# Patient Record
Sex: Male | Born: 1960
Health system: Southern US, Community
[De-identification: ages and names within clinical notes are randomized; demographics above are authoritative.]

## PROBLEM LIST (undated history)

## (undated) DIAGNOSIS — T4145XA Adverse effect of unspecified anesthetic, initial encounter: Secondary | ICD-10-CM

## (undated) DIAGNOSIS — R0600 Dyspnea, unspecified: Secondary | ICD-10-CM

## (undated) DIAGNOSIS — K284 Chronic or unspecified gastrojejunal ulcer with hemorrhage: Secondary | ICD-10-CM

## (undated) DIAGNOSIS — I10 Essential (primary) hypertension: Secondary | ICD-10-CM

## (undated) DIAGNOSIS — D649 Anemia, unspecified: Secondary | ICD-10-CM

## (undated) DIAGNOSIS — N189 Chronic kidney disease, unspecified: Secondary | ICD-10-CM

## (undated) DIAGNOSIS — IMO0001 Reserved for inherently not codable concepts without codable children: Secondary | ICD-10-CM

## (undated) DIAGNOSIS — M1990 Unspecified osteoarthritis, unspecified site: Secondary | ICD-10-CM

## (undated) DIAGNOSIS — K274 Chronic or unspecified peptic ulcer, site unspecified, with hemorrhage: Secondary | ICD-10-CM

## (undated) DIAGNOSIS — J189 Pneumonia, unspecified organism: Secondary | ICD-10-CM

## (undated) DIAGNOSIS — T8859XA Other complications of anesthesia, initial encounter: Secondary | ICD-10-CM

## (undated) DIAGNOSIS — Z5189 Encounter for other specified aftercare: Secondary | ICD-10-CM

## (undated) HISTORY — PX: EXTERNAL EAR SURGERY: SHX627

## (undated) HISTORY — PX: COLONOSCOPY: SHX174

## (undated) HISTORY — DX: Chronic kidney disease, unspecified: N18.9

## (undated) HISTORY — PX: OTHER SURGICAL HISTORY: SHX169

## (undated) HISTORY — PX: JOINT REPLACEMENT: SHX530

---

## 2008-08-13 ENCOUNTER — Encounter: Admission: RE | Admit: 2008-08-13 | Discharge: 2008-08-13 | Payer: Self-pay | Admitting: Nephrology

## 2010-11-08 HISTORY — PX: HIP PINNING: SHX1757

## 2010-11-30 ENCOUNTER — Encounter (INDEPENDENT_AMBULATORY_CARE_PROVIDER_SITE_OTHER): Payer: Self-pay | Admitting: Internal Medicine

## 2010-11-30 ENCOUNTER — Inpatient Hospital Stay (HOSPITAL_COMMUNITY)
Admission: EM | Admit: 2010-11-30 | Discharge: 2010-12-03 | Payer: Self-pay | Source: Home / Self Care | Attending: Internal Medicine | Admitting: Internal Medicine

## 2010-12-01 LAB — BLOOD GAS, ARTERIAL
Bicarbonate: 20.2 mEq/L (ref 20.0–24.0)
Drawn by: 331001
FIO2: 1 %
MECHVT: 500 mL
PEEP: 5 cmH2O
RATE: 15 resp/min
pO2, Arterial: 188 mmHg — ABNORMAL HIGH (ref 80.0–100.0)

## 2010-12-01 LAB — DIFFERENTIAL
Basophils Absolute: 0 10*3/uL (ref 0.0–0.1)
Eosinophils Relative: 0 % (ref 0–5)

## 2010-12-01 LAB — PROTIME-INR
INR: 1.07 (ref 0.00–1.49)
Prothrombin Time: 14.1 seconds (ref 11.6–15.2)

## 2010-12-01 LAB — GLUCOSE, CAPILLARY: Glucose-Capillary: 118 mg/dL — ABNORMAL HIGH (ref 70–99)

## 2010-12-01 LAB — CBC
HCT: 35.3 % — ABNORMAL LOW (ref 39.0–52.0)
MCV: 94.9 fL (ref 78.0–100.0)
RBC: 3.72 MIL/uL — ABNORMAL LOW (ref 4.22–5.81)
RDW: 13.8 % (ref 11.5–15.5)

## 2010-12-01 LAB — BASIC METABOLIC PANEL
Chloride: 105 mEq/L (ref 96–112)
GFR calc non Af Amer: 31 mL/min — ABNORMAL LOW (ref >60)
Glucose, Bld: 119 mg/dL — ABNORMAL HIGH (ref 70–99)
Sodium: 137 mEq/L (ref 135–145)

## 2010-12-01 LAB — CROSSMATCH: Antibody Screen: NEGATIVE

## 2010-12-02 LAB — GLUCOSE, CAPILLARY
Glucose-Capillary: 93 mg/dL (ref 70–99)
Glucose-Capillary: 105 mg/dL — ABNORMAL HIGH (ref 70–99)
Glucose-Capillary: 115 mg/dL — ABNORMAL HIGH (ref 70–99)
Glucose-Capillary: 110 mg/dL — ABNORMAL HIGH (ref 70–99)
Glucose-Capillary: 104 mg/dL — ABNORMAL HIGH (ref 70–99)
Glucose-Capillary: 112 mg/dL — ABNORMAL HIGH (ref 70–99)
Glucose-Capillary: 107 mg/dL — ABNORMAL HIGH (ref 70–99)

## 2010-12-02 LAB — URINALYSIS, MICROSCOPIC ONLY
Bilirubin Urine: NEGATIVE
Ketones, ur: NEGATIVE mg/dL
Leukocytes, UA: NEGATIVE
Nitrite: NEGATIVE
Protein, ur: NEGATIVE mg/dL
Urobilinogen, UA: 0.2 mg/dL (ref 0.0–1.0)
pH: 5 (ref 5.0–8.0)

## 2010-12-02 LAB — DIFFERENTIAL
Basophils Relative: 0 % (ref 0–1)
Eosinophils Relative: 1 % (ref 0–5)
Lymphocytes Relative: 15 % (ref 12–46)
Monocytes Absolute: 1 10*3/uL (ref 0.1–1.0)
Monocytes Relative: 11 % (ref 3–12)
Neutro Abs: 6.9 10*3/uL (ref 1.7–7.7)
Neutrophils Relative %: 74 % (ref 43–77)

## 2010-12-02 LAB — COMPREHENSIVE METABOLIC PANEL
ALT: 15 U/L (ref 0–53)
AST: 21 U/L (ref 0–37)
Albumin: 3.2 g/dL — ABNORMAL LOW (ref 3.5–5.2)
Albumin: 3.1 g/dL — ABNORMAL LOW (ref 3.5–5.2)
Alkaline Phosphatase: 66 U/L (ref 39–117)
Calcium: 9.5 mg/dL (ref 8.4–10.5)
Chloride: 111 mEq/L (ref 96–112)
GFR calc Af Amer: 59 mL/min — ABNORMAL LOW (ref >60)
GFR calc Af Amer: >60 mL/min (ref >60)
Glucose, Bld: 93 mg/dL (ref 70–99)
Potassium: 4.5 mEq/L (ref 3.5–5.1)
Sodium: 143 mEq/L (ref 135–145)
Sodium: 136 mEq/L (ref 135–145)
Total Bilirubin: 0.7 mg/dL (ref 0.3–1.2)
Total Protein: 6.4 g/dL (ref 6.0–8.3)

## 2010-12-02 LAB — CBC
HCT: 30.2 % — ABNORMAL LOW (ref 39.0–52.0)
HCT: 30.7 % — ABNORMAL LOW (ref 39.0–52.0)
Hemoglobin: 10 g/dL — ABNORMAL LOW (ref 13.0–17.0)
MCHC: 32.6 g/dL (ref 30.0–36.0)
MCV: 95 fL (ref 78.0–100.0)
Platelets: 198 10*3/uL (ref 150–400)
RDW: 13.7 % (ref 11.5–15.5)
RDW: 13.6 % (ref 11.5–15.5)

## 2010-12-02 LAB — HEMOGLOBIN A1C: Mean Plasma Glucose: 128 mg/dL — ABNORMAL HIGH (ref <117)

## 2010-12-02 LAB — PROTIME-INR
INR: 1.44 (ref 0.00–1.49)
Prothrombin Time: 17.7 seconds — ABNORMAL HIGH (ref 11.6–15.2)

## 2010-12-02 LAB — BRAIN NATRIURETIC PEPTIDE: Pro B Natriuretic peptide (BNP): <30 pg/mL (ref 0.0–100.0)

## 2010-12-03 LAB — GLUCOSE, CAPILLARY
Glucose-Capillary: 92 mg/dL (ref 70–99)
Glucose-Capillary: 101 mg/dL — ABNORMAL HIGH (ref 70–99)

## 2010-12-03 LAB — PROTIME-INR: INR: 1.57 — ABNORMAL HIGH (ref 0.00–1.49)

## 2010-12-03 NOTE — Op Note (Addendum)
NAMEBIFF, CHARON              ACCOUNT NO.:  [id number]  MEDICAL RECORD NO.:  [id number]          PATIENT TYPE:  INP  LOCATION:  [id number]                         FACILITY:  [facility]  PHYSICIAN:  Johnny Bridge, MD    DATE OF BIRTH:  [birth year]  DATE OF PROCEDURE:  11/30/2010 DATE OF DISCHARGE:                              OPERATIVE REPORT   PREOPERATIVE DIAGNOSIS:  Left femoral neck fracture.  POSTOPERATIVE DIAGNOSIS:  Left femoral neck fracture.  OPERATIVE PROCEDURE:  Left hip percutaneous pinning.  ANESTHESIA:  General.  ESTIMATED BLOOD LOSS:  75 mL.  OPERATIVE IMPLANTS:  Synthes 7.3-mm cannulated screws x3.  These were stainless steel screws.  PREOPERATIVE INDICATIONS:  Mark [name] is a 50 year old morbidly obese gentleman who weighs approximately 350 pounds who had the somewhat insidious onset left hip pain without any preexisting trauma that he knows of.  This has been going on about a week.  He had previous [facility] done [facility] that diagnosed him with trochanteric bursitis.  He continued to have hip pain and presented to the emergency room today with worsening pain.  X-rays demonstrated evidence for a femoral neck fracture with mild displacement.  I discussed the risks, benefits and alternatives to percutaneous pinning versus total hip replacement versus hemiarthroplasty versus nonsurgical management with him, including the risks of infection, bleeding, nerve injury, blood clots, avascular necrosis, peri- prosthetic fracture, the need for revision surgery, the need for conversion hip arthroplasty, blood clots, cardiopulmonary complications, among others and he wished to proceed with percutaneous pinning.  OPERATIVE PROCEDURE:  The patient was brought to the operating room, placed in a supine position.  IV Ancef 2 g were given.  General anesthesia administered.  Left lower extremity was prepped and draped in the usual sterile fashion.  Incision was  made over the proximal aspect of the femur laterally.  A guidewire was introduced.  Localizing the three-dimensional location of his hip was extremely difficult given his size.  Nevertheless, I was able to ultimately find his proximal femur with a guidewire and then placed a single screw in the anterior inferior position.  I elected to place 2 inferior screws, one anteriorly and one posteriorly, given his size and the fact that I was trying to get the best purchase possible, which was in the inferior portion of the neck and head.  I placed a single central superior screw.  I tried to keep the screws as proximal as possible in order to minimize stress risers at the lesser trochanter, however, given his size and trajectory of the guide pins, this was somewhat challenging, but nevertheless, I had satisfactory position and I drilled and prepared the screws after measuring a length.  Getting a lateral view of the hip articulation itself was also extremely challenging given his size, however, we were able to get satisfactory views which demonstrated that the hardware was not penetrating the joint.  I then placed a total of 3 cannulated screws.  Excellent bite was achieved, especially on the distal most screws.  The wounds were irrigated copiously and then the subcutaneous tissue closed with 3-0 Vicryl followed by 4-0  Monocryl with Steri-Strips and sterile gauze.  He tolerated the procedure well.  There were no complications.  He will be touch-toe weightbearing for a period of 3 months.  He will be on blood thinners for a period of 2-month.     Johnny Bridge, MD     JPL/MEDQ  D:  11/30/2010  T:  12/01/2010  Job:  [id number]  Electronically Signed by Marchia Bond MD on 12/03/2010 05:31:24 PM
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
## 2010-12-11 NOTE — Discharge Summary (Signed)
  Mark [name]              ACCOUNT NO.:  [id number]  MEDICAL RECORD NO.:  [id number]          PATIENT TYPE:  INP  LOCATION:  5004                         FACILITY:  [facility]  PHYSICIAN:  Johnny Bridge, MD    DATE OF BIRTH:  1961/02/20  DATE OF ADMISSION:  11/30/2010 DATE OF DISCHARGE:  12/03/2010                              DISCHARGE SUMMARY   ADMISSION DIAGNOSES: 1. Left femoral neck fracture. 2. Acute renal failure.  DISCHARGE DIAGNOSIS:  Left femoral neck fracture.  ADDITIONAL DIAGNOSES: 1. Morbid obesity. 2. Diabetes. 3. Acute renal insufficiency.  Discharge medications will include Coumadin as well as Lovenox until his INR is therapeutic and Percocet, Robaxin, and additional medications per the medical reconciliation sheet.  DISCHARGE INSTRUCTIONS:  He should be touch toe weightbearing on left lower extremity for a duration of 3 months.  DISCHARGE FOLLOWUP:  He will follow up with me in 2 weeks.HOSPITAL COURSE:  [name] is a 50 year old gentleman who presented to the hospital with insidious onset left-sided hip pain. There was no trauma.  He was found to have a femoral neck fracture.  He went to surgery emergently for pinning.  He tolerated this well and postoperatively did not have any complications.  He was managed by the Medical Service as far as his medical issues were concerned.  From an orthopedic standpoint, he had his dressings changed on postoperative day 3, and his wounds were clean and dry.  Sensation was intact distally. His hemoglobin and hematocrit remained stable.  He was working with physical therapy and making reasonable progress.  His weightbearing status was touch toe weightbearing.  His renal function improved steadily throughout his hospitalization.  He is planning to be discharged home with follow up with me in approximately 2 weeks.  There are no complications.  He benefited maximally from this hospital stay. Additional  management regarding blood pressure medicines and diabetes medicines may be added per the [facility].  I will plan to see Mark [name] in 2 weeks.     Johnny Bridge, MD     JPL/MEDQ  D:  12/03/2010  T:  12/03/2010  Job:  [id number]  Electronically Signed by Marchia Bond MD on 12/11/2010 05:18:00 PM

## 2011-01-11 ENCOUNTER — Other Ambulatory Visit: Payer: Self-pay | Admitting: Orthopedic Surgery

## 2011-01-11 DIAGNOSIS — S72009A Fracture of unspecified part of neck of unspecified femur, initial encounter for closed fracture: Secondary | ICD-10-CM

## 2011-01-14 NOTE — Discharge Summary (Addendum)
NAMETAIGEN, GAMBOA              ACCOUNT NO.:  [id number]  MEDICAL RECORD NO.:  [id number]          PATIENT TYPE:  INP  LOCATION:  5004                         FACILITY:  [facility]  PHYSICIAN:  Sheila Oats, M.D.DATE OF BIRTH:  [birth year]  DATE OF ADMISSION:  11/30/2010 DATE OF DISCHARGE:  12/03/2010                        DISCHARGE SUMMARY - REFERRING   DISCHARGE DIAGNOSES: 1. Left femoral neck fracture - status post left hip percutaneous     pinning by Dr. Marchia Bond. 2. Acute-on-chronic renal insufficiency - resolved, creatinine prior     to discharge 1.21 with BUN of 24. 3. Hypertension. 4. Diabetes mellitus. 5. History of gout. 6. Obesity.  CONSULTATION:  Orthopedics - Dr. Johnny Bridge, MD.  PROCEDURES AND STUDIES: 1. Left femur x-ray on 11/30/2010 - nondisplaced fracture of the left     femoral neck. 2. Followup left hip x-ray - left femoral neck fracture. 3. Chest x-ray on 11/30/2010 - cardiomegaly, no active disease. 4. Left hip x-ray, status post surgery - three pins placed for     treatment of nondisplaced femoral neck fracture. 5. Followup chest x-ray on 11/30/2010 - bilateral perihilar air space     densities - aspiration pneumonitis cannot be excluded, although     perihilar atelectasis or bilateral pneumonia could have a similar     appearance.  Cardiomegaly.  Endotracheal tube tip 1.5 cm above the     carina. 6. Portable pelvic x-ray on 11/30/2010 - status post repair of left     hip fracture.  No acute findings. 7. Followup chest x-ray on 12/02/2010 - interval removal of     endotracheal tube.  Stable moderate enlargement of cardiac density.     Minimal right basilar atelectasis and infiltrative density in the     left base with improvement since the previous study. 8. A 2D echocardiogram done on 12/02/2010 - ejection fraction 65-70%,     no regional wall motion abnormality.  BRIEF HISTORY:  The patient is a 50 year old black male with the  above- listed medical problems who presented with complaints of left hip pain. He reported that he started having the pain about 14 days prior mostly with weightbearing.  He denied any recent fall or trauma.  He stated that he took ibuprofen with little relief.  About six days prior to this presentation, he went to the Urgent Care as the pain was worsening and was diagnosed with bursitis and treated with prednisone.  He took the prednisone as directed, but because his pain was worsening; he came to the ED.  He reported that it was affecting his ability to sleep or reposition himself.  In the ED, a chest x-ray was done, which was consistent with a left femoral neck fracture and workup revealed a creatinine of 2.25 and it was noted that the patient's baseline per Dr. Sanda Klein office was 1.3.  He was admitted for further evaluation and management.  HOSPITAL COURSE BY PROBLEM: 1. Left femoral neck fracture - upon admission, orthopedics was     consulted and Dr. Mardelle Matte saw the patient and he was taken to     surgery and percutaneous pinning  of that left femoral neck was     done.  PT/OT was consulted and they followed the patient and     recommended home health PT/OT at discharge.  Dr. Mardelle Matte followed up     with the patient; and from his standpoint, he is stable for     discharge at this time.  Dr. Mardelle Matte set the patient up for     outpatient Lovenox prophylactic dose at 40 mg daily along with     Coumadin for five days until the INR is therapeutic, and he is to     stay on the Coumadin for one month postop.  He is to follow up with     Dr. Mardelle Matte in two weeks.  The patient's INR today prior to     discharge is 1.57 and home health RN is to check his PT/INR. 2. Abnormal chest x-ray/atelectasis - the patient had an x-ray done     while on the ventilator on the day of his surgery and it revealed     bilateral perihilar airspace opacities and the differential     included aspiration  pneumonitis versus atelectasis versus bilateral     pneumonia.  The patient, however, remained asymptomatic without any     fevers, cough, or leukocytosis; so he was monitored after he was     extubated.  Repeat chest x-ray done on 12/02/2010 showed minimal     right basilar atelectasis and improvement of the atelectasis and     infiltrative density in the left base.  The patient has continued     to do well - afebrile, no leukocytosis, and no cough.  He will be     discharged at this time and he is to follow up outpatient.  The     patient had a brain natriuretic peptide done, which was less than     30 and a 2D echocardiogram was also done.  The results are as     stated above with an ejection fraction of 65-70% and normal wall     motion. 3. Diabetes mellitus - the patient's Accu-Cheks are monitored and he     was covered with sliding scale insulin during his hospital stay.     He is to continue his Actos and metformin upon discharge. 4. Hypertension - his ACE inhibitor was held during this hospital stay     because he presented with acute-on-chronic renal insufficiency and     his blood pressures were treated with beta blockers instead.  His     renal insufficiency has resolved at this time and he is to continue     his ACE inhibitor upon discharge. 5. Acute-on-chronic kidney disease - as discussed above, the patient's     creatinine on admission was 2.26 and his baseline per Dr. Sanda Klein     office is 1.3.  His ACE inhibitor was held.  During this hospital     stay, he was hydrated and with that his creatinine normalized -     creatinine of 1.21 with a BUN of 24.  He is to follow up with Dr.     Penelope Coop outpatient as scheduled.  DISCHARGE MEDICATIONS: 1. Calcium carbonate/vitamin D one to two tablets p.o. daily. 2. Lovenox 40 mg subcutaneously daily until the INR is therapeutic and     the Lovenox to be discontinued. 3. Methocarbamol 500 mg one p.o. every 6 hours p.r.n. 4. Percocet  10/325 mg one to two tablets every 6  hours p.r.n. 5. Coumadin 2.5 mg daily or as directed per MD and the patient is to     take for one month. 6. Actos 45 mg one p.o. daily. 7. Allopurinol 300 mg p.o. daily. 8. Lisinopril 10 mg p.o. daily. 9. Metformin 500 mg one p.o. b.i.d.  FOLLOWUP CARE: 1. Dr. Electa Sniff in one week. 2. Dr. Marchia Bond in two weeks.  DISCHARGE CONDITION:  Improved/stable.     Sheila Oats, M.D.     ACV/MEDQ  D:  12/03/2010  T:  12/03/2010  Job:  [id number]  cc:   Parke Poisson. Electa Sniff, M.D. Johnny Bridge, MD  Electronically Signed by Minette Headland M.D. on 01/12/2011 05:21:08 PM Electronically Signed by Minette Headland M.D. on 01/12/2011 07:32:29 PM

## 2011-01-18 ENCOUNTER — Ambulatory Visit
Admission: RE | Admit: 2011-01-18 | Discharge: 2011-01-18 | Disposition: A | Discharge status: Home / Self Care | Payer: Self-pay | Source: Ambulatory Visit | Attending: Orthopedic Surgery | Admitting: Orthopedic Surgery

## 2011-01-18 DIAGNOSIS — S72009A Fracture of unspecified part of neck of unspecified femur, initial encounter for closed fracture: Secondary | ICD-10-CM

## 2011-07-27 ENCOUNTER — Other Ambulatory Visit: Payer: Self-pay | Admitting: Orthopedic Surgery

## 2011-07-27 ENCOUNTER — Ambulatory Visit
Admission: RE | Admit: 2011-07-27 | Discharge: 2011-07-27 | Disposition: A | Discharge status: Home / Self Care | Payer: 59 | Source: Ambulatory Visit | Attending: Orthopedic Surgery | Admitting: Orthopedic Surgery

## 2011-07-27 DIAGNOSIS — R52 Pain, unspecified: Secondary | ICD-10-CM

## 2011-09-23 ENCOUNTER — Encounter (HOSPITAL_COMMUNITY): Payer: Self-pay | Admitting: Pharmacy Technician

## 2011-09-27 ENCOUNTER — Encounter (HOSPITAL_COMMUNITY)
Admission: RE | Admit: 2011-09-27 | Discharge: 2011-09-27 | Disposition: A | Discharge status: Home / Self Care | Payer: 59 | Source: Ambulatory Visit | Attending: Orthopedic Surgery | Admitting: Orthopedic Surgery

## 2011-09-27 ENCOUNTER — Other Ambulatory Visit: Payer: Self-pay

## 2011-09-27 ENCOUNTER — Encounter (HOSPITAL_COMMUNITY): Payer: Self-pay

## 2011-09-27 HISTORY — DX: Chronic or unspecified gastrojejunal ulcer with hemorrhage: K28.4

## 2011-09-27 HISTORY — DX: Anemia, unspecified: D64.9

## 2011-09-27 HISTORY — DX: Encounter for other specified aftercare: Z51.89

## 2011-09-27 HISTORY — DX: Essential (primary) hypertension: I10

## 2011-09-27 HISTORY — DX: Chronic or unspecified peptic ulcer, site unspecified, with hemorrhage: K27.4

## 2011-09-27 HISTORY — DX: Reserved for inherently not codable concepts without codable children: IMO0001

## 2011-09-27 LAB — URINALYSIS, ROUTINE W REFLEX MICROSCOPIC
Bilirubin Urine: NEGATIVE
Ketones, ur: NEGATIVE mg/dL
Leukocytes, UA: NEGATIVE
Nitrite: NEGATIVE
Protein, ur: NEGATIVE mg/dL

## 2011-09-27 LAB — PROTIME-INR
INR: 1.11 (ref 0.00–1.49)
Prothrombin Time: 14.5 seconds (ref 11.6–15.2)

## 2011-09-27 LAB — DIFFERENTIAL
Lymphocytes Relative: 24 % (ref 12–46)
Lymphs Abs: 1.4 10*3/uL (ref 0.7–4.0)
Monocytes Relative: 12 % (ref 3–12)
Neutro Abs: 3.6 10*3/uL (ref 1.7–7.7)
Neutrophils Relative %: 61 % (ref 43–77)

## 2011-09-27 LAB — TYPE AND SCREEN
ABO/RH(D): O POS
Antibody Screen: NEGATIVE

## 2011-09-27 LAB — BASIC METABOLIC PANEL
GFR calc non Af Amer: 68 mL/min — ABNORMAL LOW (ref >90)
Glucose, Bld: 95 mg/dL (ref 70–99)
Potassium: 4.9 mEq/L (ref 3.5–5.1)
Sodium: 141 mEq/L (ref 135–145)

## 2011-09-27 LAB — CBC
Hemoglobin: 12.7 g/dL — ABNORMAL LOW (ref 13.0–17.0)
MCH: 32.3 pg (ref 26.0–34.0)
Platelets: 200 10*3/uL (ref 150–400)
RBC: 3.93 MIL/uL — ABNORMAL LOW (ref 4.22–5.81)
WBC: 5.8 10*3/uL (ref 4.0–10.5)

## 2011-09-27 LAB — SURGICAL PCR SCREEN: Staphylococcus aureus: POSITIVE — AB

## 2011-09-27 LAB — APTT: aPTT: 30 seconds (ref 24–37)

## 2011-09-27 NOTE — Pre-Procedure Instructions (Signed)
[place]  09/27/2011   Your procedure is scheduled on: November 28  Report to [facility] at 10:45 AM.  Call this number if you have problems the morning of surgery: [phone]   Remember:   Do not eat food:After Midnight.  Do not drink clear liquids: 4 Hours before arrival.  Take these medicines the morning of surgery with A SIP OF WATER: allopurinol   Do not wear jewelry, make-up or nail polish.  Do not wear lotions, powders, or perfumes. You may wear deodorant.  Do not shave 48 hours prior to surgery.  Do not bring valuables to the hospital.  Contacts, dentures or bridgework may not be worn into surgery.  Leave suitcase in the car. After surgery it may be brought to your room.  For patients admitted to the hospital, checkout time is 11:00 AM the day of discharge.   Patients discharged the day of surgery will not be allowed to drive home.  Name and phone number of your driver: NA  Special Instructions: Incentive Spirometry - Practice and bring it with you on the day of surgery. and CHG Shower Use Special Wash: 1/2 bottle night before surgery and 1/2 bottle morning of surgery.   Please read over the following fact sheets that you were given: Pain Booklet, Coughing and Deep Breathing, Blood Transfusion Information, Total Joint Packet and Surgical Site Infection Prevention

## 2011-09-28 NOTE — Consult Note (Signed)
Anesthesia:  50 year old male for left THA and hardware removal.  Hx + DM2, osteoporosis, gout, HTN, GIB requiring transfusion, and s/p left hip percutaneous pinning 11/30/10.  During that admission he also developed acute on chronic RI.  He had an echocardiogram then showing mild LVH, EF 65-70%, no LV wall motion abnormalities, grade 1 diastolic dysfunction, and no significant valvular disease.  Preoperative labs, CXR, and EKG noted.  EKG with incomplete R BBB, but overall appears stable since his pre-op EKG in January.  No CV symptoms reported at PAT.  Plan to proceed.

## 2011-10-05 MED ORDER — DEXTROSE-NACL 5-0.45 % IV SOLN: INTRAVENOUS | Status: DC

## 2011-10-05 MED ORDER — CEFAZOLIN SODIUM-DEXTROSE 2-3 GM-% IV SOLR: 2.0000 g | INTRAVENOUS | Status: DC

## 2011-10-05 MED ORDER — CHLORHEXIDINE GLUCONATE 4 % EX LIQD: 60.0000 mL | Freq: Once | CUTANEOUS | Status: DC

## 2011-10-05 NOTE — H&P (Signed)
  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  He is still on a walker.  He has a great deal of pain with weightbearing and is now on disability because he cannot do his regular job or even light duty at this time.  past medical history: Relatively complicated. He does have a history of hypertension, some renal dysfunction, and diabetes. He is a smoker since the age of 44. He now is down to about half a pack per day. He also has a history of gout for which he has been on some steroids in the past. He had an ulcer back in 1998 and does take some medication for reflux.    Review of systems reviewed thoroughly and all other systems are negative as related to the chief complaint.  OBJECTIVE:  The CT scan is reviewed.  It does show a nonunion of the basicervical fracture.  There is windshield wipering of the pins as noted by Dr. Mardelle Matte who sent him over here in consultation. Overall he is 330 pounds, 5 feet 9 inches tall, well-nourished, well-developed black male in mild distress, seated on the exam table.  I did roll him up on his right side to examine his left hip.  I do believe there is enough movement for [place] to go ahead and do a hip replacement on him despite his body habitus.  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  PLAN:  We will get him set up for removal of the 7.3 mm Synthes screws and conversion to a total hip arthroplasty using a Pinnacle cup, probably metal-on-metal liner since this is a fairly active 50 year old and relatively heavy although I would agree to doing a polyethylene on ceramic liner bearing cup.  In any event risks and benefits of surgery were discussed.  Models brought into the room.  We will get him setup for surgical intervention.  He is looking to have this done probably sometimes in the next few months.  In the meantime, he will remain out  on disability.

## 2011-10-06 ENCOUNTER — Encounter (HOSPITAL_COMMUNITY)
Admission: RE | Disposition: A | Discharge status: Skilled Nursing Facility | Payer: Self-pay | Source: Ambulatory Visit | Attending: Orthopedic Surgery

## 2011-10-06 ENCOUNTER — Inpatient Hospital Stay (HOSPITAL_COMMUNITY): Payer: 59

## 2011-10-06 ENCOUNTER — Encounter (HOSPITAL_COMMUNITY): Payer: Self-pay | Admitting: Vascular Surgery

## 2011-10-06 ENCOUNTER — Encounter (HOSPITAL_COMMUNITY): Payer: Self-pay | Admitting: Orthopedic Surgery

## 2011-10-06 ENCOUNTER — Inpatient Hospital Stay (HOSPITAL_COMMUNITY)
Admission: RE | Admit: 2011-10-06 | Discharge: 2011-10-12 | DRG: 470 | Disposition: A | Discharge status: Skilled Nursing Facility | Payer: 59 | Source: Ambulatory Visit | Attending: Orthopedic Surgery | Admitting: Orthopedic Surgery

## 2011-10-06 ENCOUNTER — Encounter (HOSPITAL_COMMUNITY): Payer: Self-pay | Admitting: *Deleted

## 2011-10-06 ENCOUNTER — Inpatient Hospital Stay (HOSPITAL_COMMUNITY): Payer: 59 | Admitting: Vascular Surgery

## 2011-10-06 DIAGNOSIS — R0989 Other specified symptoms and signs involving the circulatory and respiratory systems: Secondary | ICD-10-CM | POA: Diagnosis present

## 2011-10-06 DIAGNOSIS — F17200 Nicotine dependence, unspecified, uncomplicated: Secondary | ICD-10-CM | POA: Diagnosis present

## 2011-10-06 DIAGNOSIS — D62 Acute posthemorrhagic anemia: Secondary | ICD-10-CM | POA: Diagnosis not present

## 2011-10-06 DIAGNOSIS — I10 Essential (primary) hypertension: Secondary | ICD-10-CM | POA: Diagnosis present

## 2011-10-06 DIAGNOSIS — E119 Type 2 diabetes mellitus without complications: Secondary | ICD-10-CM | POA: Diagnosis present

## 2011-10-06 DIAGNOSIS — Z880 Allergy status to penicillin: Secondary | ICD-10-CM

## 2011-10-06 DIAGNOSIS — Z888 Allergy status to other drugs, medicaments and biological substances status: Secondary | ICD-10-CM

## 2011-10-06 DIAGNOSIS — Z6841 Body Mass Index (BMI) 40.0 and over, adult: Secondary | ICD-10-CM

## 2011-10-06 DIAGNOSIS — M12559 Traumatic arthropathy, unspecified hip: Principal | ICD-10-CM | POA: Diagnosis present

## 2011-10-06 DIAGNOSIS — M1652 Unilateral post-traumatic osteoarthritis, left hip: Secondary | ICD-10-CM | POA: Diagnosis present

## 2011-10-06 DIAGNOSIS — R0609 Other forms of dyspnea: Secondary | ICD-10-CM | POA: Diagnosis present

## 2011-10-06 DIAGNOSIS — K279 Peptic ulcer, site unspecified, unspecified as acute or chronic, without hemorrhage or perforation: Secondary | ICD-10-CM | POA: Diagnosis present

## 2011-10-06 DIAGNOSIS — IMO0002 Reserved for concepts with insufficient information to code with codable children: Secondary | ICD-10-CM | POA: Diagnosis present

## 2011-10-06 HISTORY — PX: TOTAL HIP ARTHROPLASTY: SHX124

## 2011-10-06 HISTORY — DX: Unspecified osteoarthritis, unspecified site: M19.90

## 2011-10-06 LAB — HEMOGLOBIN AND HEMATOCRIT, BLOOD
HCT: 29.6 % — ABNORMAL LOW (ref 39.0–52.0)
Hemoglobin: 9.9 g/dL — ABNORMAL LOW (ref 13.0–17.0)

## 2011-10-06 LAB — GLUCOSE, CAPILLARY
Glucose-Capillary: 95 mg/dL (ref 70–99)
Glucose-Capillary: 78 mg/dL (ref 70–99)

## 2011-10-06 SURGERY — ARTHROPLASTY, HIP, TOTAL,POSTERIOR APPROACH
Anesthesia: General | Site: Hip | Laterality: Left | Wound class: Clean

## 2011-10-06 MED ORDER — MAGNESIUM HYDROXIDE 400 MG/5ML PO SUSP: 30.0000 mL | Freq: Two times a day (BID) | ORAL | Status: DC | PRN

## 2011-10-06 MED ORDER — ESMOLOL HCL 10 MG/ML IV SOLN
INTRAVENOUS | Status: DC | PRN
Start: 2011-10-06 — End: 2011-10-06
  Administered 2011-10-06: 50 mg via INTRAVENOUS

## 2011-10-06 MED ORDER — ONDANSETRON HCL 4 MG/2ML IJ SOLN
INTRAMUSCULAR | Status: DC | PRN
  Administered 2011-10-06: 4 mg via INTRAVENOUS

## 2011-10-06 MED ORDER — POLYETHYLENE GLYCOL 3350 17 G PO PACK
17.0000 g | PACK | Freq: Every day | ORAL | Status: DC | PRN
  Filled 2011-10-06: qty 1

## 2011-10-06 MED ORDER — BISACODYL 5 MG PO TBEC: 10.0000 mg | DELAYED_RELEASE_TABLET | Freq: Every day | ORAL | Status: DC | PRN

## 2011-10-06 MED ORDER — LACTATED RINGERS IV SOLN
INTRAVENOUS | Status: DC | PRN
  Administered 2011-10-06 (×3): via INTRAVENOUS

## 2011-10-06 MED ORDER — ACETAMINOPHEN 10 MG/ML IV SOLN
INTRAVENOUS | Status: AC
  Filled 2011-10-06: qty 100

## 2011-10-06 MED ORDER — PHENYLEPHRINE HCL 10 MG/ML IJ SOLN
INTRAMUSCULAR | Status: DC | PRN
  Administered 2011-10-06: 80 ug via INTRAVENOUS
  Administered 2011-10-06 (×2): 120 ug via INTRAVENOUS
  Administered 2011-10-06: 80 ug via INTRAVENOUS

## 2011-10-06 MED ORDER — HETASTARCH-ELECTROLYTES 6 % IV SOLN
INTRAVENOUS | Status: DC | PRN
  Administered 2011-10-06: 14:00:00 via INTRAVENOUS

## 2011-10-06 MED ORDER — DIPHENHYDRAMINE HCL 12.5 MG/5ML PO ELIX
12.5000 mg | ORAL_SOLUTION | ORAL | Status: DC | PRN
  Filled 2011-10-06: qty 10

## 2011-10-06 MED ORDER — HYDROMORPHONE HCL PF 1 MG/ML IJ SOLN: 0.5000 mg | INTRAMUSCULAR | Status: DC | PRN

## 2011-10-06 MED ORDER — VANCOMYCIN HCL 1000 MG IV SOLR
1500.0000 mg | INTRAVENOUS | Status: AC
  Administered 2011-10-06: 1500 mg via INTRAVENOUS
  Filled 2011-10-06: qty 1500

## 2011-10-06 MED ORDER — DOCUSATE SODIUM 100 MG PO CAPS
100.0000 mg | ORAL_CAPSULE | Freq: Two times a day (BID) | ORAL | Status: DC
  Administered 2011-10-06 – 2011-10-12 (×11): 100 mg via ORAL
  Filled 2011-10-06 (×13): qty 1

## 2011-10-06 MED ORDER — METOCLOPRAMIDE HCL 5 MG/ML IJ SOLN
5.0000 mg | Freq: Three times a day (TID) | INTRAMUSCULAR | Status: DC | PRN
  Administered 2011-10-08: 10 mg via INTRAVENOUS
  Filled 2011-10-06: qty 2

## 2011-10-06 MED ORDER — ZOLPIDEM TARTRATE 5 MG PO TABS: 5.0000 mg | ORAL_TABLET | Freq: Every evening | ORAL | Status: DC | PRN

## 2011-10-06 MED ORDER — FENTANYL CITRATE 0.05 MG/ML IJ SOLN
INTRAMUSCULAR | Status: DC | PRN
  Administered 2011-10-06: 250 ug via INTRAVENOUS
  Administered 2011-10-06: 50 ug via INTRAVENOUS
  Administered 2011-10-06: 100 ug via INTRAVENOUS
  Administered 2011-10-06 (×2): 50 ug via INTRAVENOUS

## 2011-10-06 MED ORDER — INSULIN ASPART 100 UNIT/ML ~~LOC~~ SOLN
0.0000 [IU] | Freq: Three times a day (TID) | SUBCUTANEOUS | Status: DC
  Administered 2011-10-07: 2 [IU] via SUBCUTANEOUS
  Administered 2011-10-07: 3 [IU] via SUBCUTANEOUS
  Administered 2011-10-07: 2 [IU] via SUBCUTANEOUS
  Administered 2011-10-08: 3 [IU] via SUBCUTANEOUS
  Administered 2011-10-09: 2 [IU] via SUBCUTANEOUS
  Filled 2011-10-06: qty 3

## 2011-10-06 MED ORDER — SODIUM CHLORIDE 0.9 % IV SOLN
INTRAVENOUS | Status: DC | PRN
  Administered 2011-10-06: 13:00:00 via INTRAVENOUS

## 2011-10-06 MED ORDER — OXYCODONE HCL 5 MG PO TABS
5.0000 mg | ORAL_TABLET | ORAL | Status: DC | PRN
  Administered 2011-10-06 – 2011-10-09 (×5): 10 mg via ORAL
  Administered 2011-10-10: 5 mg via ORAL
  Filled 2011-10-06 (×5): qty 2
  Filled 2011-10-06: qty 1

## 2011-10-06 MED ORDER — LISINOPRIL 10 MG PO TABS
10.0000 mg | ORAL_TABLET | Freq: Every day | ORAL | Status: DC
  Administered 2011-10-07 – 2011-10-12 (×6): 10 mg via ORAL
  Filled 2011-10-06 (×6): qty 1

## 2011-10-06 MED ORDER — ACETAMINOPHEN 650 MG RE SUPP: 650.0000 mg | Freq: Four times a day (QID) | RECTAL | Status: DC | PRN

## 2011-10-06 MED ORDER — ACETAMINOPHEN 10 MG/ML IV SOLN
INTRAVENOUS | Status: DC | PRN
  Administered 2011-10-06: 1000 mg via INTRAVENOUS

## 2011-10-06 MED ORDER — SUCCINYLCHOLINE CHLORIDE 20 MG/ML IJ SOLN
INTRAMUSCULAR | Status: DC | PRN
  Administered 2011-10-06: 100 mg via INTRAVENOUS

## 2011-10-06 MED ORDER — ROCURONIUM BROMIDE 100 MG/10ML IV SOLN
INTRAVENOUS | Status: DC | PRN
  Administered 2011-10-06 (×2): 50 mg via INTRAVENOUS
  Administered 2011-10-06: 30 mg via INTRAVENOUS

## 2011-10-06 MED ORDER — MORPHINE SULFATE 10 MG/ML IJ SOLN
INTRAMUSCULAR | Status: DC | PRN
  Administered 2011-10-06 (×2): 5 mg via INTRAVENOUS

## 2011-10-06 MED ORDER — PHENYLEPHRINE HCL 10 MG/ML IJ SOLN
10.0000 mg | INTRAVENOUS | Status: DC | PRN
  Administered 2011-10-06: 25 ug/min via INTRAVENOUS

## 2011-10-06 MED ORDER — ACETAMINOPHEN 325 MG PO TABS
650.0000 mg | ORAL_TABLET | Freq: Four times a day (QID) | ORAL | Status: DC | PRN
  Administered 2011-10-10 – 2011-10-11 (×2): 650 mg via ORAL
  Filled 2011-10-06 (×2): qty 2

## 2011-10-06 MED ORDER — ARTIFICIAL TEARS OP OINT
TOPICAL_OINTMENT | OPHTHALMIC | Status: DC | PRN
  Administered 2011-10-06: 1 via OPHTHALMIC

## 2011-10-06 MED ORDER — SODIUM CHLORIDE 0.9 % IR SOLN
Status: DC | PRN
  Administered 2011-10-06: 1000 mL

## 2011-10-06 MED ORDER — COUMADIN BOOK
Freq: Once | Status: AC
  Administered 2011-10-06: 21:00:00
  Filled 2011-10-06: qty 1

## 2011-10-06 MED ORDER — BUPIVACAINE-EPINEPHRINE 0.5% -1:200000 IJ SOLN
INTRAMUSCULAR | Status: DC | PRN
  Administered 2011-10-06: 20 mL

## 2011-10-06 MED ORDER — WARFARIN VIDEO
Freq: Once | Status: AC
  Administered 2011-10-06: 21:00:00

## 2011-10-06 MED ORDER — ALUM & MAG HYDROXIDE-SIMETH 200-200-20 MG/5ML PO SUSP: 30.0000 mL | ORAL | Status: DC | PRN

## 2011-10-06 MED ORDER — ONDANSETRON HCL 4 MG/2ML IJ SOLN: 4.0000 mg | Freq: Four times a day (QID) | INTRAMUSCULAR | Status: DC | PRN

## 2011-10-06 MED ORDER — LACTATED RINGERS IV SOLN: INTRAVENOUS | Status: DC

## 2011-10-06 MED ORDER — HYDROCODONE-ACETAMINOPHEN 5-325 MG PO TABS
1.0000 | ORAL_TABLET | ORAL | Status: DC | PRN
  Administered 2011-10-08: 2 via ORAL
  Filled 2011-10-06: qty 2

## 2011-10-06 MED ORDER — NEOSTIGMINE METHYLSULFATE 1 MG/ML IJ SOLN
INTRAMUSCULAR | Status: DC | PRN
  Administered 2011-10-06: 5 mg via INTRAVENOUS

## 2011-10-06 MED ORDER — METFORMIN HCL 500 MG PO TABS
1000.0000 mg | ORAL_TABLET | Freq: Two times a day (BID) | ORAL | Status: DC
  Administered 2011-10-07 – 2011-10-12 (×11): 1000 mg via ORAL
  Filled 2011-10-06 (×13): qty 2

## 2011-10-06 MED ORDER — PHENOL 1.4 % MT LIQD
1.0000 | OROMUCOSAL | Status: DC | PRN
  Filled 2011-10-06: qty 177

## 2011-10-06 MED ORDER — ONDANSETRON HCL 4 MG PO TABS: 4.0000 mg | ORAL_TABLET | Freq: Four times a day (QID) | ORAL | Status: DC | PRN

## 2011-10-06 MED ORDER — METHOCARBAMOL 500 MG PO TABS
500.0000 mg | ORAL_TABLET | Freq: Four times a day (QID) | ORAL | Status: DC | PRN
  Administered 2011-10-07 – 2011-10-09 (×2): 500 mg via ORAL
  Filled 2011-10-06 (×3): qty 1

## 2011-10-06 MED ORDER — GLYCOPYRROLATE 0.2 MG/ML IJ SOLN
INTRAMUSCULAR | Status: DC | PRN
  Administered 2011-10-06: .6 mg via INTRAVENOUS

## 2011-10-06 MED ORDER — METHOCARBAMOL 100 MG/ML IJ SOLN
500.0000 mg | Freq: Four times a day (QID) | INTRAMUSCULAR | Status: DC | PRN
  Filled 2011-10-06: qty 5

## 2011-10-06 MED ORDER — MIDAZOLAM HCL 5 MG/5ML IJ SOLN
INTRAMUSCULAR | Status: DC | PRN
  Administered 2011-10-06: 2 mg via INTRAVENOUS

## 2011-10-06 MED ORDER — PROPOFOL 10 MG/ML IV EMUL
INTRAVENOUS | Status: DC | PRN
  Administered 2011-10-06: 400 mg via INTRAVENOUS

## 2011-10-06 MED ORDER — TERIPARATIDE (RECOMBINANT) 750 MCG/3ML ~~LOC~~ SOLN: 20.0000 ug | Freq: Every day | SUBCUTANEOUS | Status: DC

## 2011-10-06 MED ORDER — MENTHOL 3 MG MT LOZG: 1.0000 | LOZENGE | OROMUCOSAL | Status: DC | PRN

## 2011-10-06 MED ORDER — ONDANSETRON HCL 4 MG/2ML IJ SOLN: 4.0000 mg | Freq: Once | INTRAMUSCULAR | Status: DC | PRN

## 2011-10-06 MED ORDER — ALLOPURINOL 300 MG PO TABS
300.0000 mg | ORAL_TABLET | Freq: Every day | ORAL | Status: DC
  Administered 2011-10-07 – 2011-10-12 (×6): 300 mg via ORAL
  Filled 2011-10-06 (×6): qty 1

## 2011-10-06 MED ORDER — BISACODYL 10 MG RE SUPP: 10.0000 mg | Freq: Every day | RECTAL | Status: DC | PRN

## 2011-10-06 MED ORDER — WARFARIN SODIUM 10 MG PO TABS
10.0000 mg | ORAL_TABLET | Freq: Once | ORAL | Status: AC
  Administered 2011-10-06: 10 mg via ORAL
  Filled 2011-10-06: qty 1

## 2011-10-06 MED ORDER — PHENYLEPHRINE HCL 10 MG/ML IJ SOLN
20.0000 mg | INTRAVENOUS | Status: DC | PRN
  Administered 2011-10-06: 10 ug/min via INTRAVENOUS

## 2011-10-06 MED ORDER — HYDROMORPHONE HCL PF 1 MG/ML IJ SOLN
0.2500 mg | INTRAMUSCULAR | Status: DC | PRN
  Administered 2011-10-06 (×2): 0.25 mg via INTRAVENOUS

## 2011-10-06 MED ORDER — KCL IN DEXTROSE-NACL 20-5-0.45 MEQ/L-%-% IV SOLN
INTRAVENOUS | Status: DC
  Administered 2011-10-06 – 2011-10-07 (×3): via INTRAVENOUS
  Administered 2011-10-08: 1000 mL via INTRAVENOUS
  Administered 2011-10-08 – 2011-10-09 (×2): via INTRAVENOUS
  Filled 2011-10-06 (×10): qty 1000

## 2011-10-06 MED ORDER — METOCLOPRAMIDE HCL 10 MG PO TABS: 5.0000 mg | ORAL_TABLET | Freq: Three times a day (TID) | ORAL | Status: DC | PRN

## 2011-10-06 MED ORDER — ENOXAPARIN SODIUM 40 MG/0.4ML ~~LOC~~ SOLN
40.0000 mg | SUBCUTANEOUS | Status: DC
  Administered 2011-10-07 – 2011-10-08 (×2): 40 mg via SUBCUTANEOUS
  Filled 2011-10-06 (×3): qty 0.4

## 2011-10-06 MED ORDER — FLEET ENEMA 7-19 GM/118ML RE ENEM: 1.0000 | ENEMA | Freq: Every day | RECTAL | Status: DC | PRN

## 2011-10-06 SURGICAL SUPPLY — 62 items
BLADE SAW SAG 73X25 THK (BLADE) ×1
BLADE SAW SGTL 18X1.27X75 (BLADE) IMPLANT
BLADE SAW SGTL 73X25 THK (BLADE) ×1 IMPLANT
BLADE SAW SGTL MED 73X18.5 STR (BLADE) IMPLANT
BRUSH FEMORAL CANAL (MISCELLANEOUS) IMPLANT
CLOTH BEACON ORANGE TIMEOUT ST (SAFETY) ×2 IMPLANT
COVER BACK TABLE 24X17X13 BIG (DRAPES) ×2 IMPLANT
COVER SURGICAL LIGHT HANDLE (MISCELLANEOUS) ×2 IMPLANT
DRAPE ORTHO SPLIT 77X108 STRL (DRAPES) ×2
DRAPE PROXIMA HALF (DRAPES) ×2 IMPLANT
DRAPE SURG ORHT 6 SPLT 77X108 (DRAPES) ×2 IMPLANT
DRAPE U-SHAPE 47X51 STRL (DRAPES) ×2 IMPLANT
DRILL BIT 7/64X5 (BIT) ×2 IMPLANT
DRSG MEPILEX BORDER 4X12 (GAUZE/BANDAGES/DRESSINGS) ×2 IMPLANT
DRSG MEPILEX BORDER 4X8 (GAUZE/BANDAGES/DRESSINGS) IMPLANT
DURAPREP 26ML APPLICATOR (WOUND CARE) ×4 IMPLANT
ELECT BLADE 4.0 EZ CLEAN MEGAD (MISCELLANEOUS) ×2
ELECT REM PT RETURN 9FT ADLT (ELECTROSURGICAL) ×2
ELECTRODE BLDE 4.0 EZ CLN MEGD (MISCELLANEOUS) ×1 IMPLANT
ELECTRODE REM PT RTRN 9FT ADLT (ELECTROSURGICAL) ×1 IMPLANT
FLOSEAL 10ML (HEMOSTASIS) IMPLANT
GAUZE XEROFORM 1X8 LF (GAUZE/BANDAGES/DRESSINGS) ×2 IMPLANT
GLOVE BIO SURGEON STRL SZ7 (GLOVE) ×2 IMPLANT
GLOVE BIO SURGEON STRL SZ7.5 (GLOVE) ×2 IMPLANT
GLOVE BIOGEL PI IND STRL 7.0 (GLOVE) ×1 IMPLANT
GLOVE BIOGEL PI IND STRL 8 (GLOVE) ×1 IMPLANT
GLOVE BIOGEL PI INDICATOR 7.0 (GLOVE) ×1
GLOVE BIOGEL PI INDICATOR 8 (GLOVE) ×1
GOWN PREVENTION PLUS XLARGE (GOWN DISPOSABLE) ×2 IMPLANT
GOWN STRL NON-REIN LRG LVL3 (GOWN DISPOSABLE) ×6 IMPLANT
HANDPIECE INTERPULSE COAX TIP (DISPOSABLE)
HOOD PEEL AWAY FACE SHEILD DIS (HOOD) ×4 IMPLANT
KIT BASIN OR (CUSTOM PROCEDURE TRAY) ×2 IMPLANT
KIT ROOM TURNOVER OR (KITS) ×2 IMPLANT
MANIFOLD NEPTUNE II (INSTRUMENTS) ×2 IMPLANT
NEEDLE 22X1 1/2 (OR ONLY) (NEEDLE) ×2 IMPLANT
NS IRRIG 1000ML POUR BTL (IV SOLUTION) ×2 IMPLANT
PACK TOTAL JOINT (CUSTOM PROCEDURE TRAY) ×2 IMPLANT
PAD ARMBOARD 7.5X6 YLW CONV (MISCELLANEOUS) ×2 IMPLANT
PASSER SUT SWANSON 36MM LOOP (INSTRUMENTS) ×2 IMPLANT
PRESSURIZER FEMORAL UNIV (MISCELLANEOUS) IMPLANT
SET HNDPC FAN SPRY TIP SCT (DISPOSABLE) IMPLANT
SPONGE LAP 18X18 X RAY DECT (DISPOSABLE) ×2 IMPLANT
STAPLER VISISTAT 35W (STAPLE) ×4 IMPLANT
SUT ETHIBOND 2 V 37 (SUTURE) ×2 IMPLANT
SUT ETHILON 3 0 FSL (SUTURE) ×2 IMPLANT
SUT VIC AB 0 CT1 27 (SUTURE) ×1
SUT VIC AB 0 CT1 27XBRD ANBCTR (SUTURE) ×1 IMPLANT
SUT VIC AB 0 CTB1 27 (SUTURE) ×2 IMPLANT
SUT VIC AB 1 CTX 36 (SUTURE) ×1
SUT VIC AB 1 CTX36XBRD ANBCTR (SUTURE) ×1 IMPLANT
SUT VIC AB 2-0 CT1 36 (SUTURE) ×2 IMPLANT
SUT VIC AB 2-0 CTB1 (SUTURE) ×2 IMPLANT
SYR BULB IRRIGATION 50ML (SYRINGE) ×2 IMPLANT
SYR CONTROL 10ML LL (SYRINGE) ×2 IMPLANT
TOWEL OR 17X24 6PK STRL BLUE (TOWEL DISPOSABLE) ×2 IMPLANT
TOWEL OR 17X26 10 PK STRL BLUE (TOWEL DISPOSABLE) ×2 IMPLANT
TOWER CARTRIDGE SMART MIX (DISPOSABLE) IMPLANT
TRAY FOLEY CATH 14FR (SET/KITS/TRAYS/PACK) ×2 IMPLANT
TUBE CONNECTING 12X1/4 (SUCTIONS) ×2 IMPLANT
WATER STERILE IRR 1000ML POUR (IV SOLUTION) ×8 IMPLANT
YANKAUER SUCT BULB TIP NO VENT (SUCTIONS) ×2 IMPLANT

## 2011-10-06 NOTE — Anesthesia Postprocedure Evaluation (Signed)
  Anesthesia Post-op Note  Patient: Mark [name]  Procedure(s) Performed:  TOTAL HIP ARTHROPLASTY  Patient Location: PACU  Anesthesia Type: General  Level of Consciousness: awake, oriented, sedated and patient cooperative  Airway and Oxygen Therapy: Patient Spontanous Breathing and Patient connected to nasal cannula oxygen  Post-op Pain: moderate  Post-op Assessment: Post-op Vital signs reviewed, Patient's Cardiovascular Status Stable, Respiratory Function Stable, Patent Airway, No signs of Nausea or vomiting and Pain level controlled  Post-op Vital Signs: stable  Complications: No apparent anesthesia complications

## 2011-10-06 NOTE — Op Note (Signed)
OPERATIVE REPORT    DATE OF PROCEDURE:  10/06/2011       PREOPERATIVE DIAGNOSIS:  LEFT FEMORAL NECK NON-UNION                                                       Estimated Body mass index is 49.98 kg/(m^2) as calculated from the following:   Height as of this encounter: 5' 9.016"(1.753 m).   Weight as of this encounter: 338 lb 10 oz(153.6 kg).     POSTOPERATIVE DIAGNOSIS:  LEFT FEMORAL NECK NON-UNION                                                           PROCEDURE:  L total hip arthroplasty using a 56 mm DePuy Gryption Cup, [employer], 10-degree polyethylene liner index superior  and posterior, a +0 36 mm ceramic head, a #[id number] SROM stem, 24Dsm Cone; removal of retained screws in femoral neck   SURGEON: Macintyre Alexa J    ASSISTANT:   Talbert Forest, PA-C  (present throughout entire procedure and necessary for timely completion of the procedure)   ANESTHESIA:  GET BLOOD LOSS: 1000cc FLUID REPLACEMENT: 2400 crystalloid DRAINS: Foley Catheter URINE OUTPUT: 0000000 COMPLICATIONS:  None    INDICATIONS FOR PROCEDURE: A 50 y.o. year-old @GENDER @  With  LEFT FEMORAL NECK NON-UNION   for 1 years, x-rays show bone-on-bone arthritic changes. Despite conservative measures with observation, anti-inflammatory medicine, narcotics, use of a cane, has severe unremitting pain and can ambulate only a few blocks before resting.  Patient desires elective L total hip arthroplasty, removal of screwss, to decrease pain and increase function. The risks, benefits, and alternatives were discussed at length including but not limited to the risks of infection, bleeding, nerve injury, stiffness, blood clots, the need for revision surgery, cardiopulmonary complications, among others, and they were willing to proceed.y have been discussed. Questions answered.     PROCEDURE IN DETAIL: The patient was identified by armband,  received preoperative IV antibiotics in the holding area at [facility], taken to the operating room , appropriate anesthetic monitors  were attached and general endotracheal anesthesia induced. Foley catheter was inserted. He was rolled into the R lateral decubitus position and fixed there with a Stulberg Mark II pelvic clamp and the L lower extremity was then prepped and draped  in the usual sterile fashion from the ankle to the hemipelvis. A time-out  procedure was performed. The skin along the lateral hip and thigh  infiltrated with 10 mL of 0.5% Marcaine and epinephrine solution. We  then made a posterolateral approach to the hip. With a #10 blade, 30 cm  incision through skin and subcutaneous tissue down to the level of the  IT band. Small bleeders were identified and cauterized. IT band cut in  line with skin incision exposing the greater trochanter. A Cobra retractor was placed between the gluteus minimus and the superior hip joint capsule, and a spiked Cobra between the quadratus femoris and the inferior hip joint capsule. This isolated the short  external rotators and piriformis tendons. These were tagged with a #2 Ethibond  suture  and cut off their insertion on the intertrochanteric crest. The posterior  capsule was then developed into an acetabular-based flap from Posterior Superior off of the acetabulum out over the femoral neck and back posterior inferior to the acetabular rim. This flap was tagged with two #2 Ethibond sutures and retracted protecting the sciatic nerve. This exposed the arthritic femoral head and osteophytes. The hip was then flexed and internally rotated, dislocating the femoral head and a standard neck cut performed 1 fingerbreadth above the lesser trochanter.  A spiked Cobra was placed in the cotyloid notch and a Hohmann retractor was then used to lever the femur anteriorly off of the anterior pelvic column. A posterior-inferior wing retractor was placed at the junction of the acetabulum and the ischium completing the acetabular  exposure.We then removed the peripheral osteophytes and labrum from the acetabulum. We then reamed the acetabulum up to 55 mm with basket reamers obtaining good coverage in all quadrants, irrigated out with normal  saline solution and hammered into place a 56 mm gryption cup in 45  degrees of abduction and about 20 degrees of anteversion. More  peripheral osteophytes removed and a trial 10-degree liner placed with the  index superior-posterior. The hip was then flexed and internally rotated exposing the  proximal femur, which was entered with the initiating reamer followed by  the axial reamers up to a 19.5 mm full depth and 55mm partial depth. We then conically reamed to 24D to the correct depth for a 42 base neck. The calcar was milled to 24Dsm. A trial cone and stem was inserted in the 25 degrees anteversion, with a +0 34mm trial head. Trial reduction was then performed and excellent stability was noted with at 90 of flexion with 60 of internal rotation and then full extension with maximal external rotation. The hip could not be dislocated in full extension. The knee could easily flex  to about 130 degrees. We also stretched the abductors at this point,  because of the preexisting adductor contractures. All trial components  were then removed. The acetabulum was irrigated out with normal saline  solution. A titanium [facility] was then screwed into place  followed by a 10-degree polyethylene liner index superior-posterior. On  the femoral side a 24Dsm ZTT1 cone was hammered into place, followed by a [id number] SROM stem in 25 degrees of anteversion. At this point, a +0 36 mm ceramic head was  hammered on the stem. The hip was reduced. We checked our stability  one more time and found to be excellent. The wound was once again  thoroughly irrigated out with normal saline solution pulse lavage. The  capsular flap and short external rotators were repaired back to the  intertrochanteric  crest through drill holes with a #2 Ethibond suture.  The IT band was closed with running 1 Vicryl suture. The subcutaneous  tissue with 0 and 2-0 undyed Vicryl suture and the skin with running  interlocking 3-0 nylon suture. Dressing of Xeroform and Mepilex was  then applied. The patient was then unclamped, rolled supine, awaken extubated and taken to recovery room without difficulty in stable condition.   [name] J 10/06/2011, 3:27 PM

## 2011-10-06 NOTE — Progress Notes (Signed)
cbg on admission to pacu:  117

## 2011-10-06 NOTE — Progress Notes (Signed)
Orthopedic Tech Progress Note Patient Details:  Mark [name] Jul 30, 1961 [id number]      [name] 10/06/2011, 4:50 PM Applied overhead frame to bed

## 2011-10-06 NOTE — Preoperative (Signed)
Beta Blockers   Reason not to administer Beta Blockers:Not Applicable 

## 2011-10-06 NOTE — Progress Notes (Signed)
Paged Mark williams, pa., pt. Has allergy to pcn and ancef 2 gm was ordered for pt.

## 2011-10-06 NOTE — Anesthesia Procedure Notes (Signed)
Procedure Name: Intubation Date/Time: 10/06/2011 1:05 PM Performed by: Luane School A. Oxygen Delivery Method: Circle System Utilized Preoxygenation: Pre-oxygenation with 100% oxygen Intubation Type: IV induction Ventilation: Mask ventilation without difficulty and Oral airway inserted - appropriate to patient size Laryngoscope Size: Sabra Heck and 2 Grade View: Grade I Tube type: Oral Tube size: 7.5 mm Number of attempts: 1 Airway Equipment and Method: patient positioned with wedge pillow and stylet Placement Confirmation: ETT inserted through vocal cords under direct vision,  breath sounds checked- equal and bilateral and positive ETCO2 Secured at: 21 cm Tube secured with: Tape Dental Injury: Teeth and Oropharynx as per pre-operative assessment

## 2011-10-06 NOTE — H&P (View-Only) (Signed)
Paged jenny williams, pa., pt. Has allergy to pcn and ancef 2 gm was ordered for pt.

## 2011-10-06 NOTE — Anesthesia Preprocedure Evaluation (Addendum)
Anesthesia Evaluation  Patient identified by MRN, date of birth, ID band Patient awake    Reviewed: Allergy & Precautions, H&P , NPO status , Patient's Chart, lab work & pertinent test results  Airway Mallampati: I TM Distance: >3 FB Neck ROM: full    Dental  (+) Teeth Intact,    Pulmonary Current Smoker,          Cardiovascular Exercise Tolerance: Poor hypertension, Pt. on medications regular Normal    Neuro/Psych Negative Neurological ROS     GI/Hepatic Neg liver ROS, PUD,   Endo/Other  Diabetes mellitus-, Well Controlled, Type 2, Oral Hypoglycemic Agents  Renal/GU Renal InsufficiencyRenal diseasenegative Renal ROS  Genitourinary negative   Musculoskeletal negative musculoskeletal ROS (+)   Abdominal   Peds  Hematology negative hematology ROS (+)   Anesthesia Other Findings   Reproductive/Obstetrics negative OB ROS                         Anesthesia Physical Anesthesia Plan  ASA: III  Anesthesia Plan: General   Post-op Pain Management:    Induction: Intravenous  Airway Management Planned: Oral ETT  Additional Equipment:   Intra-op Plan:   Post-operative Plan: Extubation in OR  Informed Consent: I have reviewed the patients History and Physical, chart, labs and discussed the procedure including the risks, benefits and alternatives for the proposed anesthesia with the patient or authorized representative who has indicated his/her understanding and acceptance.     Plan Discussed with: Anesthesiologist, CRNA and Surgeon  Anesthesia Plan Comments:         Anesthesia Quick Evaluation

## 2011-10-06 NOTE — Progress Notes (Signed)
ANTICOAGULATION CONSULT NOTE - Initial Consult  Pharmacy Consult for Coumadin  Indication: VTE prophylaxis  Allergies  Allergen Reactions  . Penicillins Swelling    Arm swells & breaks out    Patient Measurements: Height: 5' 9.02" (175.3 cm) Weight: 338 lb 10 oz (153.6 kg) IBW/kg (Calculated) : 70.74  Adjusted Body Weight:  Vital Signs: Temp: 98 F (36.7 C) (11/28 1916) Temp src: Oral (11/28 1101) BP: 153/90 mmHg (11/28 1916) Pulse Rate: 100  (11/28 1858)  Labs: No results found for this basename: HGB:2,HCT:3,PLT:3,APTT:3,LABPROT:3,INR:3,HEPARINUNFRC:3,CREATININE:3,CKTOTAL:3,CKMB:3,TROPONINI:3 in the last 72 hours Estimated Creatinine Clearance: 105.8 ml/min (by C-G formula based on Cr of 1.2).  Medical History: Past Medical History  Diagnosis Date  . Gout   . Osteoporosis   . Hypertension   . Diabetes mellitus   . Bleeding ulcer     hx of, required 6 units blood  . Blood transfusion     6 units with ulcer repair surgery  . Anemia     labs today indicate (09/27/11)    Medications:  Prescriptions prior to admission  Medication Sig Dispense Refill  . allopurinol (ZYLOPRIM) 300 MG tablet Take 300 mg by mouth daily.        . Cholecalciferol (VITAMIN D) 2000 UNITS tablet Take 2,000 Units by mouth daily.        Marland Kitchen lisinopril (PRINIVIL,ZESTRIL) 10 MG tablet Take 10 mg by mouth daily.        . metFORMIN (GLUCOPHAGE) 500 MG tablet Take 1,000 mg by mouth 2 (two) times daily with a meal.        . teriparatide (FORTEO) 750 MCG/3ML injection Inject 20 mcg into the skin daily.         Scheduled:    . allopurinol  300 mg Oral Daily  . docusate sodium  100 mg Oral BID  . enoxaparin  40 mg Subcutaneous Q24H  . insulin aspart  0-15 Units Subcutaneous TID WC  . lisinopril  10 mg Oral Daily  . metFORMIN  1,000 mg Oral BID WC  . teriparatide  20 mcg Subcutaneous Daily  . vancomycin  1,500 mg Intravenous To OR  . DISCONTD: ceFAZolin (ANCEF) IV  2 g Intravenous 60 min Pre-Op  .  DISCONTD: chlorhexidine  60 mL Topical Once    Assessment: 50 YO s/p THA.  Received orders for Coumadin for VTE px. Noted baseline labs on 11/20 ok, including INR 1.11.  Goal of Therapy:  INR 2-3   Plan: 1. Coumadin 10mg  PO x 1 today 2. Coumadin book/video to initiate education process 3. Daily PT/INR

## 2011-10-06 NOTE — Interval H&P Note (Signed)
History and Physical Interval Note:  10/06/2011 12:49 PM  Mark [name]  has presented today for surgery, with the diagnosis of LEFT FEMORAL NECK NON-UNION  The various methods of treatment have been discussed with the patient and family. After consideration of risks, benefits and other options for treatment, the patient has consented to  Procedure(s): TOTAL HIP ARTHROPLASTY as a surgical intervention .  The patients' history has been reviewed, patient examined, no change in status, stable for surgery.  I have reviewed the patients' chart and labs.  Questions were answered to the patient's satisfaction.     Kerin Salen

## 2011-10-06 NOTE — Transfer of Care (Signed)
Immediate Anesthesia Transfer of Care Note  Patient: Mark [name]  Procedure(s) Performed:  TOTAL HIP ARTHROPLASTY  Patient Location: PACU  Anesthesia Type: General  Level of Consciousness: awake, alert , oriented and patient cooperative  Airway & Oxygen Therapy: Patient Spontanous Breathing and Patient connected to face mask oxygen  Post-op Assessment: Report given to PACU RN and Post -op Vital signs reviewed and stable  Post vital signs: Reviewed and stable  Complications: No apparent anesthesia complications

## 2011-10-06 NOTE — Brief Op Note (Signed)
10/06/2011  3:25 PM  PATIENT:  Mark [name]  50 y.o. male  PRE-OPERATIVE DIAGNOSIS:  LEFT FEMORAL NECK NON-UNION  POST-OPERATIVE DIAGNOSIS:  LEFT FEMORAL NECK NON-UNION  PROCEDURE:  Procedure(s): TOTAL HIP ARTHROPLASTY  SURGEON:  Surgeon(s): Kerin Salen  PHYSICIAN ASSISTANT: Talbert Forest PA-C   ANESTHESIA:   spinal  EBL:  Total I/O In: [id number] [I.V.:1750; IV Piggyback:500] Out: [id number] [Urine:450; Blood:1000]  BLOOD ADMINISTERED:none  DRAINS: Urinary Catheter (Foley)   LOCAL MEDICATIONS USED:  MARCAINE 20CC  SPECIMEN:  No Specimen  DISPOSITION OF SPECIMEN:  N/A  COUNTS:  YES  TOURNIQUET:  * No tourniquets in log *  DICTATION: .Note written in EPIC  PLAN OF CARE: Admit to inpatient   PATIENT DISPOSITION:  PACU - hemodynamically stable.   Delay start of Pharmacological VTE agent (>24hrs) due to surgical blood loss or risk of bleeding:  No

## 2011-10-07 LAB — CBC
Platelets: 150 10*3/uL (ref 150–400)
RBC: 2.81 MIL/uL — ABNORMAL LOW (ref 4.22–5.81)
RDW: 13.7 % (ref 11.5–15.5)
WBC: 6.5 10*3/uL (ref 4.0–10.5)

## 2011-10-07 LAB — HEMOGLOBIN A1C
Hgb A1c MFr Bld: 5.4 % (ref <5.7)
Mean Plasma Glucose: 108 mg/dL (ref <117)

## 2011-10-07 LAB — BASIC METABOLIC PANEL
CO2: 23 mEq/L (ref 19–32)
Chloride: 104 mEq/L (ref 96–112)
GFR calc Af Amer: 79 mL/min — ABNORMAL LOW (ref >90)
Potassium: 4.8 mEq/L (ref 3.5–5.1)

## 2011-10-07 LAB — GLUCOSE, CAPILLARY
Glucose-Capillary: 176 mg/dL — ABNORMAL HIGH (ref 70–99)
Glucose-Capillary: 130 mg/dL — ABNORMAL HIGH (ref 70–99)

## 2011-10-07 LAB — PROTIME-INR: INR: 1.24 (ref 0.00–1.49)

## 2011-10-07 MED ORDER — WARFARIN SODIUM 5 MG PO TABS
5.0000 mg | ORAL_TABLET | Freq: Once | ORAL | Status: AC
  Administered 2011-10-07: 5 mg via ORAL
  Filled 2011-10-07 (×2): qty 1

## 2011-10-07 NOTE — Progress Notes (Signed)
ANTICOAGULATION CONSULT NOTE - Follow Up Consult  Pharmacy Consult for warfarin  Indication: VTE prophylaxis  Allergies  Allergen Reactions  . Penicillins Swelling    Arm swells & breaks out    Patient Measurements: Height: 5\' 9"  (175.3 cm) Weight: 337 lb 4.9 oz (153 kg) IBW/kg (Calculated) : 70.7   Vital Signs: Temp: 99 F (37.2 C) (11/29 0624) BP: 138/82 mmHg (11/29 0624) Pulse Rate: 119  (11/29 0624)  Labs:  Basename 10/07/11 0700 10/06/11 2052  HGB 9.0* 9.9*  HCT 27.0* 29.6*  PLT 150 --  APTT -- --  LABPROT 15.9* --  INR 1.24 --  HEPARINUNFRC -- --  CREATININE 1.20 --  CKTOTAL -- --  CKMB -- --  TROPONINI -- --   Estimated Creatinine Clearance: 105.5 ml/min (by C-G formula based on Cr of 1.2).   Medications:  Scheduled:    . allopurinol  300 mg Oral Daily  . coumadin book   Does not apply Once  . docusate sodium  100 mg Oral BID  . enoxaparin  40 mg Subcutaneous Q24H  . insulin aspart  0-15 Units Subcutaneous TID WC  . lisinopril  10 mg Oral Daily  . metFORMIN  1,000 mg Oral BID WC  . teriparatide  20 mcg Subcutaneous Daily  . warfarin  10 mg Oral Once  . warfarin   Does not apply Once  . DISCONTD: ceFAZolin (ANCEF) IV  2 g Intravenous 60 min Pre-Op  . DISCONTD: chlorhexidine  60 mL Topical Once    Assessment: 50 yo M with INR increasing towards goal.  CBC slightly decreased from baseline.  No bleeding noted.  Goal of Therapy:  INR 1.5-2 per MD progress note   Plan:  Warfarin 5 mg po x1. Am INR.  Veva Grimley L. Amada Jupiter, PharmD, [place] Clinical Pharmacist Pager: [phone] 10/07/2011 1:07 PM

## 2011-10-07 NOTE — Progress Notes (Signed)
Occupational Therapy Evaluation Patient Details Name: Mark [name] MRN: [id number] DOB: [birth year] Today's Date: 10/07/2011  Problem List:  Patient Active Problem List  Diagnoses  . Post-traumatic osteoarthritis of left hip    Past Medical History:  Past Medical History  Diagnosis Date  . Gout   . Osteoporosis   . Hypertension   . Diabetes mellitus   . Bleeding ulcer     hx of, required 6 units blood  . Blood transfusion     6 units with ulcer repair surgery  . Anemia     labs today indicate (09/27/11)  . Arthritis     right knee   Past Surgical History:  Past Surgical History  Procedure Date  . Joint replacement   . Other surgical history ~ 08/1998    repair bleeding ulcer  . Colonoscopy   . Total hip arthroplasty 10/06/11    left  . Hip pinning 11/2010    OT Assessment/Plan/Recommendation OT Assessment Clinical Impression Statement: Pt presents with medical diagnosis of L THA.  Pt demonstrates decreased I with ADLs and functional transfers due to pain and decreased activity tolerance.  Pt will benefit from OT services in the acute care setting to increase I with ADLs and functional transfers in order to maximize benefits of ST SNF rehab. OT Recommendation/Assessment: Patient will need skilled OT in the acute care venue OT Problem List: Decreased activity tolerance;Decreased knowledge of use of DME or AE;Pain;Obesity;Decreased knowledge of precautions Barriers to Discharge: Decreased caregiver support OT Therapy Diagnosis : Acute pain OT Plan OT Frequency: Min 2X/week OT Treatment/Interventions: Self-care/ADL training;DME and/or AE instruction;Therapeutic activities;Patient/family education OT Recommendation Follow Up Recommendations: Skilled nursing facility Equipment Recommended: Defer to next venue Individuals Consulted Consulted and Agree with Results and Recommendations: Patient OT Goals Acute Rehab OT Goals OT Goal Formulation: With patient Time For  Goal Achievement: 7 days ADL Goals Pt Will Perform Grooming: with modified independence;Standing at sink;Unsupported;Other (comment) (3 tasks at sink) ADL Goal: Grooming - Progress: Other (comment) Pt Will Perform Lower Body Bathing: with modified independence;with adaptive equipment;Sit to stand from bed ADL Goal: Lower Body Bathing - Progress: Other (comment) Pt Will Perform Lower Body Dressing: with modified independence;Sit to stand from bed;with adaptive equipment ADL Goal: Lower Body Dressing - Progress: Other (comment) Pt Will Transfer to Toilet: with modified independence;Ambulation;with DME;3-in-1;Maintaining hip precautions ADL Goal: Toilet Transfer - Progress: Other (comment) Pt Will Perform Toileting - Clothing Manipulation: with modified independence;Sitting on 3-in-1 or toilet;Standing;Other (comment) (while maintaining hip precautions) ADL Goal: Toileting - Clothing Manipulation - Progress: Other (comment) Pt Will Perform Toileting - Hygiene: with modified independence;Sit to stand from 3-in-1/toilet;Standing at 3-in-1/toilet;Other (comment) (while maintaining hip precautions) ADL Goal: Toileting - Hygiene - Progress: Other (comment) Miscellaneous OT Goals Miscellaneous OT Goal #1: Pt will perform bed mobility with mod I in prep for EOB ADLs. OT Goal: Miscellaneous Goal #1 - Progress: Other (comment)  OT Evaluation Precautions/Restrictions  Precautions Precautions: Posterior Hip Precaution Booklet Issued: Yes (comment) (posted in room) Precaution Comments: pt. educated on all 3 posterior hipprecautions Required Braces or Orthoses: No Restrictions Weight Bearing Restrictions: Yes LLE Weight Bearing: Weight bearing as tolerated Prior [place] Lives With: Alone Receives Help From: Family Type of Home: House Home Layout: One level Home Access: Stairs to enter Entrance Stairs-Rails: None Entrance Stairs-Number of Steps: 3 Bathroom Shower/Tub: Tub/shower  unit;Walk-in shower;Door;Curtain Bathroom Toilet: Standard Home Adaptive Equipment: Shower chair with back;Bedside commode/3-in-1;Walker - rolling;Straight cane;Quad cane;Reacher Additional Comments: obtained info during PT/OT  co-eval Prior Function Level of Independence: Needs assistance with gait;Needs assistance with tranfers;Independent with basic ADLs Driving: Yes Comments: obtained infor  dudring PT/OT co-eval ADL ADL Eating/Feeding: Performed;Independent Where Assessed - Eating/Feeding: Edge of bed Grooming: Performed;Teeth care;Minimal assistance Grooming Details (indicate cue type and reason): Min assist for steadying while standing at sink and for maintaining posterior hip precautions. Where Assessed - Grooming: Standing at sink Upper Body Bathing: Simulated;Set up Upper Body Bathing Details (indicate cue type and reason): Setup assist to gather bathing materials. Where Assessed - Upper Body Bathing: Sitting, bed;Unsupported Lower Body Bathing: Simulated;Moderate assistance Lower Body Bathing Details (indicate cue type and reason): Mod assist for bathing lower legs due to posterior hip precautions. Where Assessed - Lower Body Bathing: Sit to stand from bed Upper Body Dressing: Performed;Set up Upper Body Dressing Details (indicate cue type and reason): Setup assist to retrieve gown and snap gown around IV line. Where Assessed - Upper Body Dressing: Sitting, bed;Unsupported Lower Body Dressing: Simulated;Moderate assistance Lower Body Dressing Details (indicate cue type and reason): Mod assist to thread bilateral legs through clothing due to posterior hip precautions Where Assessed - Lower Body Dressing: Sit to stand from bed Toilet Transfer: Simulated;Minimal assistance Toilet Transfer Details (indicate cue type and reason): VC for safe technique and maintaining posterior hip precautions Toilet Transfer Method: Ambulating Toilet Transfer Equipment: Other (comment) (Simulated  toilet transfer while ambulating to chair.) Toileting - Clothing Manipulation: Simulated;Minimal assistance Toileting - Clothing Manipulation Details (indicate cue type and reason): Min assist for steadying  Where Assessed - Toileting Clothing Manipulation: Standing Toileting - Hygiene: Simulated;Supervision/safety Toileting - Hygiene Details (indicate cue type and reason): Supervision for maintaining posterior hip precautions Where Assessed - Toileting Hygiene: Standing;Sit to stand from 3-in-1 or toilet Tub/Shower Transfer: Not assessed Tub/Shower Transfer Method: Not assessed Equipment Used: Rolling walker ADL Comments: Pt with limited I with ADLs due to pain and posterior hip precautions. Would benefit from AE education. Vision/Perception  Vision - History Baseline Vision: Wears glasses all the time Cognition Cognition Arousal/Alertness: Awake/alert Overall Cognitive Status: Appears within functional limits for tasks assessed Orientation Level: Oriented X4 Sensation/Coordination Sensation Light Touch: Appears Intact (grossly checked both LE's) Extremity Assessment RUE Assessment RUE Assessment: Within Functional Limits LUE Assessment LUE Assessment: Within Functional Limits Mobility  Bed Mobility Bed Mobility: Yes Supine to Sit: 1: +2 Total assist;Patient percentage (comment) (pt. 60%) Supine to Sit Details (indicate cue type and reason): cues for safe technique and hip precautions Sit to Supine - Left: Not tested (comment) Transfers Transfers: Yes Sit to Stand: 1: +2 Total assist;From bed;With upper extremity assist;Patient percentage (comment) (pt. 60%) Sit to Stand Details (indicate cue type and reason): cues for hand placement and safe technique Stand to Sit: 1: +2 Total assist;With armrests;Patient percentage (comment) (pt. 75%) Stand to Sit Details: cues for technique and hip precautions Exercises  End of Session OT - End of Session Equipment Utilized During  Treatment: Gait belt Activity Tolerance: Patient limited by fatigue Patient left: in chair;with call bell in reach General Behavior During Session: [facility] for tasks performed Cognition: [facility] for tasks performed   Darrol Jump 10/07/2011, 12:11 PM  10/07/2011 Darrol Jump OTR/L Pager [phone] Office [phone]

## 2011-10-07 NOTE — Progress Notes (Signed)
Physical Therapy Treatment Patient Details Name: Mark [name] MRN: [id number] DOB: 12-05-1960 Today's Date: 10/07/2011  PT Assessment/Plan  PT - Assessment/Plan Comments on Treatment Session: Pt. progressing with his mobility this pm.   PT Plan: Discharge plan remains appropriate;Frequency remains appropriate PT Frequency: 7X/week Follow Up Recommendations: Skilled nursing facility Equipment Recommended: Defer to next venue PT Goals  Acute Rehab PT Goals PT Goal: Sit to Supine/Side - Progress: Progressing toward goal PT Goal: Ambulate - Progress: Progressing toward goal PT Goal: Perform Home Exercise Program - Progress: Progressing toward goal  PT Treatment Precautions/Restrictions  Precautions Precautions: Posterior Hip Precaution Booklet Issued: Yes (comment) (posted in room) Precaution Comments: reviewed precautions with pt.   He can state 2/3 without prompting.   Required Braces or Orthoses: No Restrictions Weight Bearing Restrictions: Yes LLE Weight Bearing: Weight bearing as tolerated Mobility (including Balance) Bed Mobility Bed Mobility: No Transfers Transfers: Yes Sit to Stand: 1: +2 Total assist;Patient percentage (comment);With upper extremity assist;From chair/3-in-1 (pt. 60%) Sit to Stand Details (indicate cue type and reason): cues for hand placement and safe technique Stand to Sit: 3: Mod assist Stand to Sit Details: mod assist to control descent to chair, cues/reminders for safe technique and hand placement Ambulation/Gait Ambulation/Gait: Yes Ambulation/Gait Assistance: 4: Min assist (second person for equipment) Ambulation/Gait Assistance Details (indicate cue type and reason): instruction for gait sequence, RW placement, maintaining hip precautions Ambulation Distance (Feet): 35 Feet Assistive device: Rolling walker Gait Pattern: Step-to pattern;Decreased hip/knee flexion - left Stairs: No    Exercise  Total Joint Exercises Ankle Circles/Pumps:  AROM;Left;10 reps End of Session PT - End of Session Equipment Utilized During Treatment: Gait belt Activity Tolerance: Patient tolerated treatment well;Patient limited by fatigue Patient left: in chair (pt. wanted to continue to sit in recliner after chair) General Behavior During Session: [facility] for tasks performed Cognition: [facility] for tasks performed  Ladona Ridgel 10/07/2011, 3:55 PM Acute Rehabilitation Services [phone] (pager)

## 2011-10-07 NOTE — Progress Notes (Signed)
Patient ID: Mark [name], male   DOB: 1961/04/02, 50 y.o.   MRN: [id number] PATIENT ID: Mark [name]  MRN: [id number]  DOB/AGE:  [birth year] / 50 y.o.  1 Day Post-Op Procedure(s) (LRB): TOTAL HIP ARTHROPLASTY (Left)    PROGRESS NOTE Subjective: Patient is alert, oriented,noNausea, no Vomiting, no passing gas, no Bowel Movement. Taking PO liquids. Denies SOB, Chest or Calf Pain. Using Incentive Spirometer, PAS in place. Ambulate today wbat Patient reports pain as 4 on 0-10 scale  .    Objective: Vital signs in last 24 hours: Filed Vitals:   10/06/11 2000 10/06/11 2211 10/07/11 0212 10/07/11 0624  BP: 153/90 153/98 136/90 138/82  Pulse: 110 108 115 119  Temp: 98.6 F (37 C) 100.1 F (37.8 C) 98.9 F (37.2 C) 99 F (37.2 C)  TempSrc: Oral     Resp: 16 20 20 19   Height: 5\' 9"  (1.753 m)     Weight: 153 kg (337 lb 4.9 oz)     SpO2: 95% 97% 96% 97%      Intake/Output from previous day: I/O last 3 completed shifts: In: 5756.3 [P.O.:200; I.V.:5056.3; IV Piggyback:500] Out: 2200 [Urine:1200; Blood:1000]   Intake/Output this shift:     LABORATORY DATA:  Basename 10/07/11 0700 10/07/11 0608 10/06/11 2206 10/06/11 2052 10/06/11 1612  WBC 6.5 -- -- -- --  HGB 9.0* -- -- 9.9* --  HCT 27.0* -- -- 29.6* --  PLT 150 -- -- -- --  NA -- -- -- -- --  K -- -- -- -- --  CL -- -- -- -- --  CO2 -- -- -- -- --  BUN -- -- -- -- --  CREATININE -- -- -- -- --  GLUCOSE -- -- -- -- --  GLUCAP -- 176* 166* -- 117*  INR 1.24 -- -- -- --  CALCIUM -- -- -- -- --    Examination: Neurologically intact ABD soft Neurovascular intact Sensation intact distally Intact pulses distally Dorsiflexion/Plantar flexion intact Incision: dressing C/D/I No cellulitis present Compartment soft} XR AP&Lat of hip shows well placed\fixed THA  Assessment:   1 Day Post-Op Procedure(s) (LRB): TOTAL HIP ARTHROPLASTY (Left) ADDITIONAL DIAGNOSIS:  Diabetes, Hypertension and morbid superobesity BMI  50  Plan: PT/OT WBAT, THA  posterior precautions  DVT Prophylaxis: Lovenox\Coumadin bridge, monitor INR 1.5-2.0 target  DISCHARGE PLAN: Home  DISCHARGE NEEDS: HHPT, HHRN, CPM, Walker and 3-in-1 comode seat

## 2011-10-07 NOTE — Progress Notes (Signed)
CM consult received for SNF. WIll refer to CSW per Therapy recommendations SNF. Annabell Sabal [facility] 10/07/2011

## 2011-10-07 NOTE — Progress Notes (Signed)
Physical Therapy Evaluation Patient Details Name: Mark [name] MRN: [id number] DOB: Apr 14, 1961 Today's Date: 10/07/2011  Problem List:  Patient Active Problem List  Diagnoses  . Post-traumatic osteoarthritis of left hip    Past Medical History:  Past Medical History  Diagnosis Date  . Gout   . Osteoporosis   . Hypertension   . Diabetes mellitus   . Bleeding ulcer     hx of, required 6 units blood  . Blood transfusion     6 units with ulcer repair surgery  . Anemia     labs today indicate (09/27/11)  . Arthritis     right knee   Past Surgical History:  Past Surgical History  Procedure Date  . Joint replacement   . Other surgical history ~ 08/1998    repair bleeding ulcer  . Colonoscopy   . Total hip arthroplasty 10/06/11    left  . Hip pinning 11/2010    PT Assessment/Plan/Recommendation PT Assessment Clinical Impression Statement:  left posterior THR with dependencies in functional mobility and gait.  Needs acute then ST-SNF PT to maximize independece and return home. PT Recommendation/Assessment: Patient will need skilled PT in the acute care venue PT Problem List: Decreased strength;Decreased activity tolerance;Decreased balance;Decreased mobility;Decreased knowledge of use of DME;Decreased knowledge of precautions Barriers to Discharge: Decreased caregiver support PT Therapy Diagnosis : Difficulty walking;Acute pain PT Plan PT Frequency: 7X/week PT Treatment/Interventions: DME instruction;Gait training;Functional mobility training;Therapeutic exercise;Balance training;Patient/family education PT Recommendation Follow Up Recommendations: Skilled nursing facility Equipment Recommended: Defer to next venue PT Goals  Acute Rehab PT Goals PT Goal Formulation: With patient Time For Goal Achievement: 7 days Pt will go Supine/Side to Sit: with mod assist PT Goal: Supine/Side to Sit - Progress: Progressing toward goal Pt will go Sit to Supine/Side: with mod  assist PT Goal: Sit to Supine/Side - Progress: Not met Pt will Transfer Sit to Stand/Stand to Sit: with min assist;with upper extremity assist PT Transfer Goal: Sit to Stand/Stand to Sit - Progress: Progressing toward goal Pt will Ambulate: 16 - 50 feet;with min assist;with rolling walker PT Goal: Ambulate - Progress: Progressing toward goal Pt will Perform Home Exercise Program: with min assist  PT Evaluation Precautions/Restrictions  Precautions Precautions: Posterior Hip Precaution Booklet Issued: Yes (comment) (posted in room) Precaution Comments: pt. educated on all 3 posterior hipprecautions Required Braces or Orthoses: No Restrictions Weight Bearing Restrictions: Yes LLE Weight Bearing: Weight bearing as tolerated Prior Functioning  Home Living Lives With: Alone Receives Help From: Family Type of Home: House Home Layout: One level Home Access: Stairs to enter Entrance Stairs-Rails: None Entrance Stairs-Number of Steps: 3 Bathroom Shower/Tub: Tub/shower unit;Walk-in shower;Door;Curtain Bathroom Toilet: Standard Home Adaptive Equipment: Shower chair with back;Bedside commode/3-in-1;Walker - rolling;Straight cane;Quad cane;Reacher Additional Comments: obtained info during PT/OT co-eval Prior Function Level of Independence: Needs assistance with gait;Needs assistance with tranfers;Independent with basic ADLs Driving: Yes Comments: obtained infor  dudring PT/OT co-eval Cognition Cognition Arousal/Alertness: Awake/alert Overall Cognitive Status: Appears within functional limits for tasks assessed Orientation Level: Oriented X4 Sensation/Coordination Sensation Light Touch: Appears Intact (grossly checked both LE's) Extremity Assessment RLE Assessment RLE Assessment: Within Functional Limits LLE Assessment LLE Assessment: Exceptions to WFL LLE AROM (degrees) Overall AROM Left Lower Extremity: Within functional limits for tasks assessed LLE Strength LLE Overall  Strength: Deficits;Other (Comment) (due to s/p surgery, fair quad set, good ankle pump) Mobility (including Balance) Bed Mobility Bed Mobility: Yes Supine to Sit: 1: +2 Total assist;Patient percentage (comment) (pt. 60%) Supine to Sit  Details (indicate cue type and reason): cues for safe technique and hip precautions Sit to Supine - Left: Not tested (comment) Transfers Transfers: Yes Sit to Stand: 1: +2 Total assist;From bed;With upper extremity assist;Patient percentage (comment) (pt. 60%) Sit to Stand Details (indicate cue type and reason): cues for hand placement and safe technique Stand to Sit: 1: +2 Total assist;With armrests;Patient percentage (comment) (pt. 75%) Stand to Sit Details: cues for technique and hip precautions Ambulation/Gait Ambulation/Gait: Yes Ambulation/Gait Assistance: 4: Min assist (second person for equiment) Ambulation/Gait Assistance Details (indicate cue type and reason): instruction for sequence, RW placement, maintaining hip precautions Ambulation Distance (Feet): 15 Feet Assistive device: Rolling walker Gait Pattern: Step-to pattern;Decreased hip/knee flexion - left Stairs: No  Posture/Postural Control Posture/Postural Control: No significant limitations Balance Balance Assessed: Yes Static Sitting Balance Static Sitting - Level of Assistance: 7: Independent Static Standing Balance Static Standing - Level of Assistance: 5: Stand by assistance Exercise  Total Joint Exercises Ankle Circles/Pumps: AROM;Both;10 reps;Supine Quad Sets: AROM;Left;Supine End of Session PT - End of Session Equipment Utilized During Treatment: Gait belt Activity Tolerance: Patient tolerated treatment well;Patient limited by fatigue;Patient limited by pain Patient left: in chair;with call bell in reach Nurse Communication: Mobility status for transfers;Mobility status for ambulation;Weight bearing status (posted on dry erase board) General Behavior During Session: [facility] for  tasks performed Cognition: [facility] for tasks performed  Ladona Ridgel 10/07/2011, 11:51 AM Acute Rehabilitation Services [phone] (pager)

## 2011-10-08 ENCOUNTER — Encounter (HOSPITAL_COMMUNITY): Payer: Self-pay | Admitting: Orthopedic Surgery

## 2011-10-08 ENCOUNTER — Inpatient Hospital Stay (HOSPITAL_COMMUNITY): Payer: 59

## 2011-10-08 LAB — CBC
MCV: 94.5 fL (ref 78.0–100.0)
Platelets: 165 10*3/uL (ref 150–400)
RDW: 13.2 % (ref 11.5–15.5)
WBC: 15.3 10*3/uL — ABNORMAL HIGH (ref 4.0–10.5)

## 2011-10-08 LAB — GLUCOSE, CAPILLARY
Glucose-Capillary: 165 mg/dL — ABNORMAL HIGH (ref 70–99)
Glucose-Capillary: 116 mg/dL — ABNORMAL HIGH (ref 70–99)

## 2011-10-08 LAB — HEMOGLOBIN AND HEMATOCRIT, BLOOD
HCT: 26.1 % — ABNORMAL LOW (ref 39.0–52.0)
Hemoglobin: 8.9 g/dL — ABNORMAL LOW (ref 13.0–17.0)

## 2011-10-08 LAB — PROTIME-INR: Prothrombin Time: 17.8 seconds — ABNORMAL HIGH (ref 11.6–15.2)

## 2011-10-08 MED ORDER — METOCLOPRAMIDE HCL 10 MG PO TABS: 10.0000 mg | ORAL_TABLET | Freq: Three times a day (TID) | ORAL | Status: DC | PRN

## 2011-10-08 MED ORDER — OXYCODONE-ACETAMINOPHEN 5-325 MG PO TABS: 1.0000 | ORAL_TABLET | ORAL | Status: AC | PRN

## 2011-10-08 MED ORDER — WARFARIN SODIUM 5 MG PO TABS: 7.5000 mg | ORAL_TABLET | Freq: Once | ORAL | Status: DC

## 2011-10-08 MED ORDER — WARFARIN SODIUM 7.5 MG PO TABS
7.5000 mg | ORAL_TABLET | Freq: Once | ORAL | Status: AC
  Administered 2011-10-08: 7.5 mg via ORAL
  Filled 2011-10-08: qty 1

## 2011-10-08 MED ORDER — SODIUM CHLORIDE 0.9 % IJ SOLN
500.0000 mL | Freq: Once | INTRAMUSCULAR | Status: DC
  Filled 2011-10-08: qty 501

## 2011-10-08 NOTE — Progress Notes (Signed)
PATIENT ID: Mark [name]  MRN: [id number]  DOB/AGE:  [birth year] / 50 y.o.  2 Days Post-Op Procedure(s) (LRB): TOTAL HIP ARTHROPLASTY (Left)    PROGRESS NOTE Subjective: Patient is alert, oriented,noNausea, no Vomiting, yes passing gas, no Bowel Movement. Taking PO well. Denies SOB, Chest or Calf Pain. Using Incentive Spirometer, PAS in place. Ambulating with PT Patient reports pain as moderate  .    Objective: Vital signs in last 24 hours: Filed Vitals:   10/07/11 0624 10/07/11 1300 10/07/11 2129 10/08/11 0549  BP: 138/82 141/89 137/75 144/85  Pulse: 119 119 130 120  Temp: 99 F (37.2 C) 99.6 F (37.6 C) 100.9 F (38.3 C) 98.9 F (37.2 C)  TempSrc:   Oral Oral  Resp: 19 18 20 18   Height:      Weight:      SpO2: 97% 99% 98% 96%      Intake/Output from previous day: I/O last 3 completed shifts: In: 5006.3 [P.O.:600; I.V.:4406.3] Out: 700 [Urine:700]   Intake/Output this shift:     LABORATORY DATA:  Basename 10/08/11 0608 10/07/11 2127 10/07/11 1602 10/07/11 1106 10/07/11 0700  WBC 15.3* -- -- -- 6.5  HGB 9.8* -- -- -- 9.0*  HCT 29.1* -- -- -- 27.0*  PLT 165 -- -- -- 150  NA -- -- -- -- 135  K -- -- -- -- 4.8  CL -- -- -- -- 104  CO2 -- -- -- -- 23  BUN -- -- -- -- 22  CREATININE -- -- -- -- 1.20  GLUCOSE -- -- -- -- 171*  GLUCAP -- 165* 130* 142* --  INR 1.44 -- -- -- 1.24  CALCIUM -- -- -- -- 8.8    Examination: Neurologically intact ABD soft Neurovascular intact Sensation intact distally Dorsiflexion/Plantar flexion intact Incision: dressing C/D/I} XR AP&Lat of hip shows well placed\fixed THA  Assessment:   2 Days Post-Op Procedure(s) (LRB): TOTAL HIP ARTHROPLASTY (Left) ADDITIONAL DIAGNOSIS:  Diabetes  Plan: PT/OT WBAT, THA  posterior precautions  DVT Prophylaxis: Lovenox\Coumadin bridge, monitor INR 1.5-2.0 target  DISCHARGE PLAN: Home  DISCHARGE NEEDS: HHPT, HHRN, Walker and 3-in-1 comode seat

## 2011-10-08 NOTE — Plan of Care (Signed)
Problem: Phase II Progression Outcomes Goal: Other Phase II Outcomes/Goals Ambulation progressing

## 2011-10-08 NOTE — Progress Notes (Signed)
Physical Therapy Treatment Patient Details Name: Mark [name] MRN: [id number] DOB: January 19, 1961 Today's Date: 10/08/2011  PT Assessment/Plan  PT - Assessment/Plan Comments on Treatment Session: Pt. making steady progress but is not able to DC directly home alone.  Needs ongoing rehab for increased independence at SNF before home. PT Plan: Discharge plan remains appropriate;Frequency remains appropriate PT Frequency: 7X/week Follow Up Recommendations: Skilled nursing facility Equipment Recommended: Defer to next venue PT Goals  Acute Rehab PT Goals PT Transfer Goal: Sit to Stand/Stand to Sit - Progress: Progressing toward goal PT Goal: Ambulate - Progress: Progressing toward goal  PT Treatment Precautions/Restrictions  Precautions Precautions: Posterior Hip Precaution Booklet Issued: Yes (comment) (posted in room) Precaution Comments: reviewed precautions with pt.   He is able to state 3/3 precautions and needs only occasional reminder to keep left foot pointed forward when ambulating. Required Braces or Orthoses: No Restrictions Weight Bearing Restrictions: Yes LLE Weight Bearing: Weight bearing as tolerated Mobility (including Balance) Bed Mobility Bed Mobility: No (pt. up in recliner) Transfers Transfers: Yes Sit to Stand: 3: Mod assist;With upper extremity assist;From chair/3-in-1 Sit to Stand Details (indicate cue type and reason): cues to assist self thru arms and to straighten right LE to rise Stand to Sit: 3: Mod assist Stand to Sit Details: to control descent and to sit toward back opf recliner Ambulation/Gait Ambulation/Gait: Yes Ambulation/Gait Assistance: 4: Min assist (second person to push recliner chair behind pt.) Ambulation/Gait Assistance Details (indicate cue type and reason): occasional cue for correct sequence and walker distance Ambulation Distance (Feet): 60 Feet Assistive device: Rolling walker Gait Pattern: Step-to pattern;Decreased hip/knee flexion  - left Stairs: No    Exercise  Total Joint Exercises Ankle Circles/Pumps: AROM;Both;10 reps End of Session PT - End of Session Equipment Utilized During Treatment: Gait belt Activity Tolerance: Patient limited by fatigue Patient left: in chair;with call bell in reach (pt. requested to remain in recliner) Nurse Communication: Mobility status for transfers;Mobility status for ambulation;Weight bearing status General Behavior During Session: [facility] for tasks performed Cognition: [facility] for tasks performed  Ladona Ridgel 10/08/2011, 2:09 PM Acute Rehabilitation Services [phone] (pager)

## 2011-10-08 NOTE — Progress Notes (Signed)
ANTICOAGULATION CONSULT NOTE - Follow Up Consult  Pharmacy Consult for Coumadin Indication: VTE prophylaxis  Allergies  Allergen Reactions  . Penicillins Swelling    Arm swells & breaks out    Patient Measurements: Height: 5\' 9"  (175.3 cm) Weight: 337 lb 4.9 oz (153 kg) IBW/kg (Calculated) : 70.7    Vital Signs: Temp: 98.9 F (37.2 C) (11/30 0549) Temp src: Oral (11/30 0549) BP: 144/85 mmHg (11/30 0549) Pulse Rate: 120  (11/30 0549)  Labs:  Basename 10/08/11 0608 10/07/11 0700 10/06/11 2052  HGB 9.8* 9.0* --  HCT 29.1* 27.0* 29.6*  PLT 165 150 --  APTT -- -- --  LABPROT 17.8* 15.9* --  INR 1.44 1.24 --  HEPARINUNFRC -- -- --  CREATININE -- 1.20 --  CKTOTAL -- -- --  CKMB -- -- --  TROPONINI -- -- --   Estimated Creatinine Clearance: 105.5 ml/min (by C-G formula based on Cr of 1.2).   Medications:  Scheduled:    . allopurinol  300 mg Oral Daily  . docusate sodium  100 mg Oral BID  . enoxaparin  40 mg Subcutaneous Q24H  . insulin aspart  0-15 Units Subcutaneous TID WC  . lisinopril  10 mg Oral Daily  . metFORMIN  1,000 mg Oral BID WC  . teriparatide  20 mcg Subcutaneous Daily  . warfarin  5 mg Oral ONCE-1800    Assessment: 50 yo M on coumadin for DVT px s/p THA, INR close to goal. CBC stable, no bleeding noted.  Goal of Therapy:  INR 1.5-2   Plan:  Coumadin 7.5mg  po x 1 F/u INR in am  Manley Mason 10/08/2011,11:09 AM

## 2011-10-08 NOTE — Progress Notes (Signed)
Utilization review completed. Dayvon Dax, RN, BSN. 

## 2011-10-09 ENCOUNTER — Inpatient Hospital Stay (HOSPITAL_COMMUNITY): Payer: 59

## 2011-10-09 DIAGNOSIS — I10 Essential (primary) hypertension: Secondary | ICD-10-CM | POA: Insufficient documentation

## 2011-10-09 DIAGNOSIS — E119 Type 2 diabetes mellitus without complications: Secondary | ICD-10-CM | POA: Insufficient documentation

## 2011-10-09 LAB — CBC
HCT: 21.8 % — ABNORMAL LOW (ref 39.0–52.0)
MCH: 31.9 pg (ref 26.0–34.0)
MCHC: 34.4 g/dL (ref 30.0–36.0)
MCV: 92.8 fL (ref 78.0–100.0)
Platelets: 138 10*3/uL — ABNORMAL LOW (ref 150–400)
RDW: 13.1 % (ref 11.5–15.5)
WBC: 7 10*3/uL (ref 4.0–10.5)

## 2011-10-09 LAB — GLUCOSE, CAPILLARY: Glucose-Capillary: 112 mg/dL — ABNORMAL HIGH (ref 70–99)

## 2011-10-09 MED ORDER — INSULIN ASPART 100 UNIT/ML ~~LOC~~ SOLN
0.0000 [IU] | Freq: Three times a day (TID) | SUBCUTANEOUS | Status: DC
  Filled 2011-10-09: qty 3

## 2011-10-09 MED ORDER — WARFARIN SODIUM 1 MG PO TABS
1.0000 mg | ORAL_TABLET | Freq: Once | ORAL | Status: AC
  Administered 2011-10-09: 1 mg via ORAL
  Filled 2011-10-09: qty 1

## 2011-10-09 NOTE — Consult Note (Addendum)
PCP: Dr. Delrae Rend  Primary nephrologist: Dr. Jamal Maes  M.D. requesting consultation: Dr. [name]  Reason for consultation: Evaluation for possible pneumonia.  Chief Complaint:  Decreasing and mild left hip pain  HPI: Patient is a 50 year old African American male with morbid obesity, borderline diabetes, hypertension, prior history of gastric surgery for bleeding ulcer who is status post left total hip arthroplasty (3 days postop). For some reason the patient had a chest x-ray done this morning which suggested bibasal pneumonia and that the [facility] hospitalist were consulted for evaluation. Patient denies any new respiratory symptoms. He says he had mild irritation of the throat post surgery but that has significantly improved. He denies a cough. He has been actively using then incentive spirometer. He has mild dyspnea on exertion which is at baseline. He denies fevers or chills.  Past Medical History: Past Medical History  Diagnosis Date  . Gout   . Osteoporosis   . Hypertension   . Diabetes mellitus   . Bleeding ulcer     hx of, required 6 units blood  . Blood transfusion     6 units with ulcer repair surgery  . Anemia     labs today indicate (09/27/11)  . Arthritis     right knee    Past Surgical History: Past Surgical History  Procedure Date  . Joint replacement   . Other surgical history ~ 08/1998    repair bleeding ulcer  . Colonoscopy   . Total hip arthroplasty 10/06/11    left  . Hip pinning 11/2010  . Total hip arthroplasty 10/06/2011    Procedure: TOTAL HIP ARTHROPLASTY;  Surgeon: Kerin Salen;  Location: [facility];  Service: Orthopedics;  Laterality: Left;    Allergies:   Allergies  Allergen Reactions  . Penicillins Swelling    Arm swells & breaks out    Medications: Prior to Admission medications   Medication Sig Start Date End Date Taking? Authorizing Provider  allopurinol (ZYLOPRIM) 300 MG tablet Take 300 mg by mouth daily.     Yes  Historical Provider, MD  Cholecalciferol (VITAMIN D) 2000 UNITS tablet Take 2,000 Units by mouth daily.     Yes Historical Provider, MD  lisinopril (PRINIVIL,ZESTRIL) 10 MG tablet Take 10 mg by mouth daily.     Yes Historical Provider, MD  metFORMIN (GLUCOPHAGE) 500 MG tablet Take 1,000 mg by mouth 2 (two) times daily with a meal.     Yes Historical Provider, MD  teriparatide (FORTEO) 750 MCG/3ML injection Inject 20 mcg into the skin daily.     Yes Historical Provider, MD  oxyCODONE-acetaminophen (ROXICET) 5-325 MG per tablet Take 1-2 tablets by mouth every 4 (four) hours as needed for pain. 10/08/11 10/18/11  Clearnce Sorrel. Jimmye Norman, [place]  warfarin (COUMADIN) 5 MG tablet Take 1.5 tablets (7.5 mg total) by mouth one time only at 6 PM. Take as directed by home health.  [facility] INR 1.5-2.0 10/08/11 10/07/12  Clearnce Sorrel. Jimmye Norman, [place]    Family History: 1. Patient's mother died of pancreatic cancer 2. Patient's father died of bone cancer 3. Patient's younger brother has sleep apnea  Social History:  reports that he has been smoking Cigarettes.  He has a 8 pack-year smoking history. He has never used smokeless tobacco. He reports that he drinks about 3 ounces of alcohol per week. He reports that he does not use illicit drugs. He says he used to smoke half a pack of cigarettes per day until January 2012. Since then he  has cut his cigarette down to 3-4 per day. He also says that he has not smoked for a month now. He drinks beer socially when watching ballgames. He denies substance abuse. Patient works at [employer]. He is single. He lives alone and ambulates with a help of a walker.  Advanced directives: Full code  Review of Systems:  All systems reviewed and apart from history of presenting illness are pertinent for mild and decreasing pain in the left hip operated site up to 3-5/10 which is worse after physical therapy. He had a BM yesterday. He denies headache or earache. No chest pain. No dysuria  or urinary frequency. Patient is tolerating diet. No abdominal pain, nausea or vomiting.  Physical Exam: Filed Vitals:   10/08/11 1500 10/08/11 1700 10/08/11 2127 10/09/11 0529  BP: 93/58 110/72 113/54 105/54  Pulse: 126 133 125 108  Temp: 101 F (38.3 C)  99.1 F (37.3 C) 99.7 F (37.6 C)  TempSrc:   Oral Oral  Resp: 18  18 21   Height:      Weight:      SpO2: 100%  99% 98%   General exam: Patient is a moderately built and morbidly obese male patient who is sitting on the chair and preparing to eat lunch. He is not in any distress. Head, eyes and ENT: Normocephalic, nontraumatic. Pupils equally reacting to light and accommodation. Bilateral immature cataracts. Oral mucosa is moist with no acute findings. Neck: Supple. No JVD or carotid bruit. Lymphatics: No lymphadenopathy Respiratory system: Clear to auscultation. No increased work of breathing. Cardiovascular system: First and second heart sounds heard, regular. No JVD or murmurs. Gastrointestinal system: Abdomen is obese. Patient has a midline laparotomy scar. Soft and nontender. Normal bowel sounds heard. No organomegaly or masses appreciated. Central nervous system: Alert and oriented. No focal neurological deficits. Extremities: Trace bilateral ankle edema. Grade 5 x 5 power symmetrically. Skin: No rashes   Labs on Admission:   Basename 10/07/11 0700  NA 135  K 4.8  CL 104  CO2 23  GLUCOSE 171*  BUN 22  CREATININE 1.20  CALCIUM 8.8  MG --  PHOS --   No results found for this basename: AST:2,ALT:2,ALKPHOS:2,BILITOT:2,PROT:2,ALBUMIN:2 in the last 72 hours No results found for this basename: LIPASE:2,AMYLASE:2 in the last 72 hours  Basename 10/09/11 0600 10/08/11 1610 10/08/11 0608  WBC 7.0 -- 15.3*  NEUTROABS -- -- --  HGB 7.5* 8.9* --  HCT 21.8* 26.1* --  MCV 92.8 -- 94.5  PLT 138* -- 165   No results found for this basename: CKTOTAL:3,CKMB:3,CKMBINDEX:3,TROPONINI:3 in the last 72 hours No results found for  this basename: TSH,T4TOTAL,FREET3,T3FREE,THYROIDAB in the last 72 hours No results found for this basename: VITAMINB12:2,FOLATE:2,FERRITIN:2,TIBC:2,IRON:2,RETICCTPCT:2 in the last 72 hours  Radiological Exams on Admission: Dg Chest 2 View  09/27/2011  *RADIOLOGY REPORT*  Clinical Data: Left total hip arthroplasty.  Hypertension. Diabetes.  Hypertension.  Smoker.  CHEST - 2 VIEW  Comparison: 12/02/2010  Findings: Mild hyperinflation. Lateral view degraded by patient arm position.  Midline trachea.  Normal heart size and mediastinal contours. No pleural effusion or pneumothorax.  Mild linear scarring within the left midlung on the frontal.  Likely left lower lobe.  Lungs are otherwise clear.  IMPRESSION: No acute cardiopulmonary disease.  Original Report Authenticated By: Areta Haber, M.D.   Dg Pelvis Portable  10/06/2011  *RADIOLOGY REPORT*  Clinical Data: Status post left hip arthroplasty.  PORTABLE PELVIS  Comparison: CT dated 07/27/2011  Findings: Bipolar  hemiarthroplasty shows normal alignment.  No evidence of acute fracture surrounding the prosthesis.  IMPRESSION: Normal alignment status post placement of left bipolar hemiarthroplasty.  Original Report Authenticated By: Azzie Roup, M.D.   Dg Chest Port 1 View  10/09/2011  *RADIOLOGY REPORT*  Clinical Data: Leukocytosis and fever.  PORTABLE CHEST - 1 VIEW  Comparison: Chest radiograph performed 09/27/2011  Findings: The lungs are well-aerated.  Patchy bibasilar airspace opacities raise concern for mild pneumonia.  There is no evidence of pleural effusion or pneumothorax.  The cardiomediastinal silhouette is mildly enlarged.  No acute osseous abnormalities are seen.  IMPRESSION: Patchy bibasilar airspace opacities raise concern for mild pneumonia.  Mild cardiomegaly noted.  Findings were discussed with Garner Gavel RN on [id number] at 05:26 a.m. on 10/09/2011.  Original Report Authenticated By: Santa Lighter, M.D.       Assessment/Plan  1. Patient is a 50 year old African American male patient with history of type 2 diabetes mellitus, hypertension, morbid obesity, ongoing tobacco abuse who is status post left total hip arthroplasty postoperative day #3 who is doing quite well from a surgical standpoint. He has no signs and symptoms suggestive of pneumonia. There is no leukocytosis and the low-grade fevers could be from his recent surgery. Although his chest x-ray has been read as bi-basilar pneumonia this could well be secondary to atelectasis. At this time we recommend observing him closely off antibiotics especially that there is no clear indication of pneumonia. Antibiotics could certainly increase his risk of opportunistic infection such as C. difficile colitis. However if the patient has new symptoms such as cough or dyspnea or high fevers then consider repeating his chest x-ray to reevaluate but otherwise would not repeat chest x-ray. Continue to mobilize as you're doing and the incentive spirometry. 2. Acute post hemorrhagic anemia: Would follow daily CBCs and transfuse if hemoglobin is less than 7 g/dL. 3. Mild thrombocytopenia: Possibly secondary to post hemorrhagic anemia. Follow daily CBCs. Patient is not currently clinically bleeding. 4. Type 2 diabetes mellitus: Continue current metformin and NovoLog insulin. Can follow up with his primary care physician/endocrinologist as an outpatient. Well-controlled inpatient. 5. Hypertension: Continue lisinopril 6. Tobacco abuse: Cessation counseled.   Will followup tomorrow.  Jackqueline Aquilar 10/09/2011, 12:27 PM

## 2011-10-09 NOTE — Progress Notes (Signed)
Subjective: 3 Days Post-Op Procedure(s) (LRB): TOTAL HIP ARTHROPLASTY (Left)  Activity level: oob with therapy Diet tolerance:eating well Voiding with or without catheter:voiding Patient reports pain as 2 on 0-10 scale.    Objective: Vital signs in last 24 hours: Temp:  [99.1 F (37.3 C)-101 F (38.3 C)] 99.7 F (37.6 C) (12/01 0529) Pulse Rate:  [108-133] 108  (12/01 0529) Resp:  [18-21] 21  (12/01 0529) BP: (93-113)/(54-72) 105/54 mmHg (12/01 0529) SpO2:  [98 %-100 %] 98 % (12/01 0529)  Intake/Output from previous day: 11/30 0701 - 12/01 0700 In: 2250 [I.V.:2250] Out: -  Intake/Output this shift:     Basename 10/09/11 0600 10/08/11 1610 10/08/11 0608 10/07/11 0700 10/06/11 2052  HGB 7.5* 8.9* 9.8* 9.0* 9.9*    Basename 10/09/11 0600 10/08/11 1610 10/08/11 0608  WBC 7.0 -- 15.3*  RBC 2.35* -- 3.08*  HCT 21.8* 26.1* --  PLT 138* -- 165    Basename 10/07/11 0700  NA 135  K 4.8  CL 104  CO2 23  BUN 22  CREATININE 1.20  GLUCOSE 171*  CALCIUM 8.8    Basename 10/09/11 0600 10/08/11 [id number]  LABPT -- --  INR 1.93* 1.44    Neurologically intact ABD soft Neurovascular intact Sensation intact distally Intact pulses distally Dorsiflexion/Plantar flexion intact No cellulitis present Compartment soft No dizziness  Assessment/Plan: 3 Days Post-Op Procedure(s) (LRB): TOTAL HIP ARTHROPLASTY (Left) Advance diet Up with therapy D/C IV fluids Discharge to SNF most likely on Monday.  Will hold off on giving any blood at this time.cbc in am.  Waldine Zenz R 10/09/2011, 8:23 AM

## 2011-10-09 NOTE — Progress Notes (Signed)
Chest x-ray from 10/09/11 shows possible pneumonia. I will consult [facility] for medical input.

## 2011-10-09 NOTE — Progress Notes (Signed)
Physical Therapy Treatment Patient Details Name: Mark [name] MRN: [id number] DOB: Jul 03, 1961 Today's Date: 10/09/2011  PT Assessment/Plan  PT - Assessment/Plan Comments on Treatment Session: Pt continues with steady progress. Mild SOB noted after ambulation. PT Plan: Discharge plan remains appropriate PT Frequency: 7X/week Equipment Recommended: Defer to next venue PT Goals  Acute Rehab PT Goals PT Goal Formulation: With patient Pt will go Sit to Stand: with min assist PT Goal: Sit to Stand - Progress: Progressing toward goal Pt will go Stand to Sit: with min assist PT Goal: Stand to Sit - Progress: Progressing toward goal Pt will Ambulate: 51 - 150 feet;with supervision PT Goal: Ambulate - Progress: Progressing toward goal PT Goal: Perform Home Exercise Program - Progress: Progressing toward goal  PT Treatment Precautions/Restrictions  Precautions Precautions: Posterior Hip Precaution Booklet Issued: Yes (comment) (posted in room) Precaution Comments: Pt independently recalled 3/3 hip precautions. Required Braces or Orthoses: No Restrictions Weight Bearing Restrictions: Yes LLE Weight Bearing: Weight bearing as tolerated Mobility (including Balance) Bed Mobility Bed Mobility: No (pt up in recliner) Transfers Sit to Stand: 4: Min assist;From chair/3-in-1;With armrests Sit to Stand Details (indicate cue type and reason): verbal cues for hand placement Stand to Sit: 4: Min assist;With armrests;To chair/3-in-1 Stand to Sit Details: verbal/tactile cues for trunk control duing descent Ambulation/Gait Ambulation/Gait Assistance: 4: Min assist Ambulation Distance (Feet): 90 Feet Assistive device: Rolling walker Gait Pattern: Antalgic    Exercise  Total Joint Exercises Ankle Circles/Pumps: AROM;Both;10 reps Quad Sets: AROM;Left;10 reps Heel Slides: AAROM;Left;10 reps Straight Leg Raises: AAROM;Left;10 reps End of Session PT - End of Session Equipment Utilized During  Treatment: Gait belt Activity Tolerance: Patient tolerated treatment well Patient left: in chair Nurse Communication: Mobility status for transfers;Mobility status for ambulation General Behavior During Session: [facility] for tasks performed Cognition: [facility] for tasks performed  Mark [name] 10/09/2011, 12:21 PM  Mark [name], PT pager # [phone]

## 2011-10-09 NOTE — Progress Notes (Signed)
Occupational Therapy Treatment Patient Details Name: Mark [name] MRN: [id number] DOB: [birth year] Today's Date: 10/09/2011  OT Assessment/Plan OT Assessment/Plan OT Plan: Discharge plan remains appropriate OT Frequency: Min 2X/week Follow Up Recommendations: Skilled nursing facility Equipment Recommended: Defer to next venue OT Goals Acute Rehab OT Goals Time For Goal Achievement: 7 days ADL Goals ADL Goal: Grooming - Progress: Other (comment) (pt. declined review, stating already completed) ADL Goal: Lower Body Bathing - Progress: Progressing toward goals ADL Goal: Lower Body Dressing - Progress: Progressing toward goals ADL Goal: Toilet Transfer - Progress: Other (comment) (declined practice/review stating already completed) ADL Goal: Toileting - Clothing Manipulation - Progress: Not addressed ADL Goal: Toileting - Hygiene - Progress: Not addressed Miscellaneous OT Goals OT Goal: Miscellaneous Goal #1 - Progress: Other (comment) (pt. already up in chair upon arrival)  OT Treatment Precautions/Restrictions  Precautions Precautions: Posterior Hip Precaution Comments: 3/3 hip precautions Required Braces or Orthoses: No Restrictions Weight Bearing Restrictions: Yes LLE Weight Bearing: Weight bearing as tolerated   ADL ADL Lower Body Dressing: Performed;Minimal assistance (used sock-aide with min a, for steps/sequencing) Lower Body Dressing Details (indicate cue type and reason): inst. cues for proper use of sock-aide to don b socks Where Assessed - Lower Body Dressing: Sitting, chair;Unsupported ADL Comments: declined out of chair activity stating he had already completed all requested tx. prior to arrival. agreed to LB dressing review/practice. states he has all a/e for lb dressing but needs a sock-aide, reviewed purchase info with him. Mobility  Bed Mobility Bed Mobility: No (pt up in recliner) Transfers Sit to Stand: 4: Min assist;From chair/3-in-1;With armrests Sit  to Stand Details (indicate cue type and reason): verbal cues for hand placement Stand to Sit: 4: Min assist;With armrests;To chair/3-in-1 Stand to Sit Details: verbal/tactile cues for trunk control duing descent Exercises Total Joint Exercises Ankle Circles/Pumps: AROM;Both;10 reps Quad Sets: AROM;Left;10 reps Heel Slides: AAROM;Left;10 reps Straight Leg Raises: AAROM;Left;10 reps  End of Session OT - End of Session Activity Tolerance: Patient limited by fatigue Patient left: in chair;with call bell in reach General Behavior During Session: [facility] for tasks performed Cognition: [facility] for tasks performed  Janice Coffin OTA/L 10/09/2011, 2:14 PM

## 2011-10-09 NOTE — Progress Notes (Signed)
ANTICOAGULATION CONSULT NOTE - Follow Up Consult  Pharmacy Consult for Coumadin Indication: VTE prophylaxis  Allergies  Allergen Reactions  . Penicillins Swelling    Arm swells & breaks out    Vital Signs: Temp: 99.7 F (37.6 C) (12/01 0529) Temp src: Oral (12/01 0529) BP: 105/54 mmHg (12/01 0529) Pulse Rate: 108  (12/01 0529)  Labs:  Basename 10/09/11 0600 10/08/11 1610 10/08/11 0608 10/07/11 0700  HGB 7.5* 8.9* -- --  HCT 21.8* 26.1* 29.1* --  PLT 138* -- 165 150  APTT -- -- -- --  LABPROT 22.4* -- 17.8* 15.9*  INR 1.93* -- 1.44 1.24  HEPARINUNFRC -- -- -- --  CREATININE -- -- -- 1.20  CKTOTAL -- -- -- --  CKMB -- -- -- --  TROPONINI -- -- -- --   Estimated Creatinine Clearance: 105.5 ml/min (by C-G formula based on Cr of 1.2).   Assessment: 50 yo M on coumadin for DVT px s/p THA, INR high-end goal, hgb dropped to 7.5 this am, no bleeding per chart. Pt. Is also on lovenox 40mg  sq q24hr  Goal of Therapy:  INR 1.5-2 per MD   Plan:  Coumadin 1mg  po x 1 today D/c lovenox F/u INR tomorrow  Manley Mason 10/09/2011,9:38 AM

## 2011-10-09 NOTE — Progress Notes (Signed)
Told to call

## 2011-10-09 NOTE — Progress Notes (Signed)
Dressing saturated with serosanguinous drainage.  Changed and mepilex placed.  Incision well approximated.  No abnormal redness, purulence, odor or warmth.  Slight oozing of thin, bright red blood during change occurred.  Pt tolerated well.

## 2011-10-10 LAB — GLUCOSE, CAPILLARY
Glucose-Capillary: 95 mg/dL (ref 70–99)
Glucose-Capillary: 96 mg/dL (ref 70–99)
Glucose-Capillary: 92 mg/dL (ref 70–99)

## 2011-10-10 LAB — CBC
Hemoglobin: 7.7 g/dL — ABNORMAL LOW (ref 13.0–17.0)
MCV: 92.1 fL (ref 78.0–100.0)
Platelets: 176 10*3/uL (ref 150–400)
RBC: 2.4 MIL/uL — ABNORMAL LOW (ref 4.22–5.81)
WBC: 6.1 10*3/uL (ref 4.0–10.5)

## 2011-10-10 MED ORDER — WARFARIN SODIUM 2 MG PO TABS
2.0000 mg | ORAL_TABLET | Freq: Once | ORAL | Status: AC
  Administered 2011-10-10: 2 mg via ORAL
  Filled 2011-10-10: qty 1

## 2011-10-10 NOTE — Progress Notes (Signed)
Subjective: 4 Days Post-Op Procedure(s) (LRB): TOTAL HIP ARTHROPLASTY (Left)  Activity level: oob with therapy Diet tolerance:eating Voiding with or without catheter:voiding Patient reports pain as 2 on 0-10 scale.    Objective: Vital signs in last 24 hours: Temp:  [99.4 F (37.4 C)-100.2 F (37.9 C)] 100.2 F (37.9 C) (12/02 0519) Pulse Rate:  [103-114] 103  (12/02 0519) Resp:  [20-21] 20  (12/02 0519) BP: (88-114)/(52-68) 114/68 mmHg (12/02 0519) SpO2:  [95 %-100 %] 95 % (12/02 0519)  Intake/Output from previous day: 12/01 0701 - 12/02 0700 In: 660 [P.O.:660] Out: 2250 [Urine:2250] Intake/Output this shift:     Basename 10/10/11 0600 10/09/11 0600 10/08/11 1610 10/08/11 0608  HGB 7.7* 7.5* 8.9* 9.8*    Basename 10/10/11 0600 10/09/11 0600  WBC 6.1 7.0  RBC 2.40* 2.35*  HCT 22.1* 21.8*  PLT 176 138*   No results found for this basename: NA:2,K:2,CL:2,CO2:2,BUN:2,CREATININE:2,GLUCOSE:2,CALCIUM:2 in the last 72 hours  Basename 10/10/11 0600 10/09/11 0600  LABPT -- --  INR 2.06* 1.93*    Neurologically intact ABD soft Neurovascular intact Sensation intact distally Intact pulses distally Dorsiflexion/Plantar flexion intact No cellulitis present Compartment soft Wound wnl dressing changed  Assessment/Plan: 4 Days Post-Op Procedure(s) (LRB): TOTAL HIP ARTHROPLASTY (Left) Advance diet Up with therapy Will  Hold on transfusion. Plan SNF on Mon.  Mark [name] 10/10/2011, 12:33 PM

## 2011-10-10 NOTE — Progress Notes (Signed)
ANTICOAGULATION CONSULT NOTE - Follow Up Consult  Pharmacy Consult for Coumadin  Indication: VTE prophylaxis   Allergies  Allergen Reactions  . Penicillins Swelling    Arm swells & breaks out    Vital Signs: Temp: 100.2 F (37.9 C) (12/02 0519) Temp src: Oral (12/02 0519) BP: 114/68 mmHg (12/02 0519) Pulse Rate: 103  (12/02 0519)  Labs:  Basename 10/10/11 0600 10/09/11 0600 10/08/11 1610 10/08/11 0608  HGB 7.7* 7.5* -- --  HCT 22.1* 21.8* 26.1* --  PLT 176 138* -- 165  APTT -- -- -- --  LABPROT 23.6* 22.4* -- 17.8*  INR 2.06* 1.93* -- 1.44  HEPARINUNFRC -- -- -- --  CREATININE -- -- -- --  CKTOTAL -- -- -- --  CKMB -- -- -- --  TROPONINI -- -- -- --   Estimated Creatinine Clearance: 105.5 ml/min (by C-G formula based on Cr of 1.2).   Assessment: 50 yo M on coumadin for DVT px s/p THA, INR slightly above goal, hgb low but, stable.   Goal of Therapy:  INR 1.5-2 (per MD)   Plan:  Coumadin 2mg  po x 1 F/u INR in am  Manley Mason 10/10/2011,10:10 AM

## 2011-10-10 NOTE — Progress Notes (Signed)
CSW received SNF consult from RN today. CSW met with pt and he is agreeable to SNF. CSW will initiate search in [place]. Weekday CSW will f/u with offers Monday. Full CSW eval and FL2 in chart.  Wandra Feinstein, MSW, [place] (w/e cover)

## 2011-10-10 NOTE — Progress Notes (Signed)
Subjective: Patient says he continues to improve. He says he walked more today with physical therapist. No new complaints. Chronic unchanged dyspnea on exertion and minimal cough with white sputum.  Objective: Blood pressure 114/68, pulse 103, temperature 100.2 F (37.9 C), temperature source Oral, resp. rate 20, height 5\' 9"  (1.753 m), weight 153 kg (337 lb 4.9 oz), SpO2 95.00%.  Intake/Output Summary (Last 24 hours) at 10/10/11 1143 Last data filed at 10/09/11 2100  Gross per 24 hour  Intake    240 ml  Output   1150 ml  Net   -910 ml   General exam: Patient is sitting comfortably on the chair and is in no obvious distress. Respiratory system: Clear. No increased work of breathing. Cardiovascular system: S1 and S2 heard, regular. No JVD. Gastrointestinal system: Abdomen is obese but soft and nontender. Normal bowel sounds heard. Central nervous system: Alert and oriented. No focal neurological deficits.  Lab Results: Basic Metabolic Panel: No results found for this basename: NA:2,K:2,CL:2,CO2:2,GLUCOSE:2,BUN:2,CREATININE:2,CALCIUM:2,MG:2,PHOS:2 in the last 72 hours Liver Function Tests: No results found for this basename: AST:2,ALT:2,ALKPHOS:2,BILITOT:2,PROT:2,ALBUMIN:2 in the last 72 hours No results found for this basename: LIPASE:2,AMYLASE:2 in the last 72 hours No results found for this basename: AMMONIA:2 in the last 72 hours CBC:  Basename 10/10/11 0600 10/09/11 0600  WBC 6.1 7.0  NEUTROABS -- --  HGB 7.7* 7.5*  HCT 22.1* 21.8*  MCV 92.1 92.8  PLT 176 138*   Cardiac Enzymes: No results found for this basename: CKTOTAL:3,CKMB:3,CKMBINDEX:3,TROPONINI:3 in the last 72 hours BNP: No results found for this basename: POCBNP:3 in the last 72 hours D-Dimer: No results found for this basename: DDIMER:2 in the last 72 hours CBG:  Basename 10/10/11 0633 10/09/11 2111 10/09/11 1700 10/09/11 1216 10/09/11 0626 10/08/11 2125  GLUCAP 95 103* 109* 112* 126* 121*   Hemoglobin  A1C: No results found for this basename: HGBA1C in the last 72 hours Fasting Lipid Panel: No results found for this basename: CHOL,HDL,LDLCALC,TRIG,CHOLHDL,LDLDIRECT in the last 72 hours Thyroid Function Tests: No results found for this basename: TSH,T4TOTAL,FREET4,T3FREE,THYROIDAB in the last 72 hours Anemia Panel: No results found for this basename: VITAMINB12,FOLATE,FERRITIN,TIBC,IRON,RETICCTPCT in the last 72 hours Coagulation:  Basename 10/10/11 0600 10/09/11 0600  LABPROT 23.6* 22.4*  INR 2.06* 1.93*   Urine Drug Screen: Drugs of Abuse  No results found for this basename: labopia,  cocainscrnur,  labbenz,  amphetmu,  thcu,  labbarb    Alcohol Level: No results found for this basename: ETH:2 in the last 72 hours Urinalysis:  Misc. Labs:   Micro Results: No results found for this or any previous visit (from the past 240 hour(s)).  Studies/Results: Dg Chest 2 View  09/27/2011  *RADIOLOGY REPORT*  Clinical Data: Left total hip arthroplasty.  Hypertension. Diabetes.  Hypertension.  Smoker.  CHEST - 2 VIEW  Comparison: 12/02/2010  Findings: Mild hyperinflation. Lateral view degraded by patient arm position.  Midline trachea.  Normal heart size and mediastinal contours. No pleural effusion or pneumothorax.  Mild linear scarring within the left midlung on the frontal.  Likely left lower lobe.  Lungs are otherwise clear.  IMPRESSION: No acute cardiopulmonary disease.  Original Report Authenticated By: Areta Haber, M.D.   Dg Pelvis Portable  10/06/2011  *RADIOLOGY REPORT*  Clinical Data: Status post left hip arthroplasty.  PORTABLE PELVIS  Comparison: CT dated 07/27/2011  Findings: Bipolar hemiarthroplasty shows normal alignment.  No evidence of acute fracture surrounding the prosthesis.  IMPRESSION: Normal alignment status post placement of left bipolar hemiarthroplasty.  Original Report Authenticated By: Azzie Roup, M.D.   Dg Chest Port 1 View  10/09/2011  *RADIOLOGY  REPORT*  Clinical Data: Leukocytosis and fever.  PORTABLE CHEST - 1 VIEW  Comparison: Chest radiograph performed 09/27/2011  Findings: The lungs are well-aerated.  Patchy bibasilar airspace opacities raise concern for mild pneumonia.  There is no evidence of pleural effusion or pneumothorax.  The cardiomediastinal silhouette is mildly enlarged.  No acute osseous abnormalities are seen.  IMPRESSION: Patchy bibasilar airspace opacities raise concern for mild pneumonia.  Mild cardiomegaly noted.  Findings were discussed with Garner Gavel RN on [id number] at 05:26 a.m. on 10/09/2011.  Original Report Authenticated By: Santa Lighter, M.D.    Medications: Scheduled Meds:    . allopurinol  300 mg Oral Daily  . docusate sodium  100 mg Oral BID  . insulin aspart  0-9 Units Subcutaneous TID WC  . lisinopril  10 mg Oral Daily  . metFORMIN  1,000 mg Oral BID WC  . sodium chloride  500 mL Intravenous Once  . teriparatide  20 mcg Subcutaneous Daily  . warfarin  1 mg Oral ONCE-1800  . warfarin  2 mg Oral ONCE-1800  . DISCONTD: insulin aspart  0-15 Units Subcutaneous TID WC   Continuous Infusions:    . DISCONTD: dextrose 5 % and 0.45 % NaCl with KCl 20 mEq/L 125 mL/hr at 10/09/11 0211   PRN Meds:.acetaminophen, acetaminophen, alum & mag hydroxide-simeth, bisacodyl, bisacodyl, diphenhydrAMINE, HYDROcodone-acetaminophen, HYDROmorphone, magnesium hydroxide, menthol-cetylpyridinium, methocarbamol(ROBAXIN) IV, methocarbamol, metoCLOPramide (REGLAN) injection, metoCLOPramide, ondansetron (ZOFRAN) IV, ondansetron, oxyCODONE, phenol, polyethylene glycol, sodium phosphate, zolpidem  Assessment/Plan: 1. Acute posthemorrhagic anemia, stable: Transfuse if hemoglobin less than 7 g/dL. Currently asymptomatic. 2. Mild thrombocytopenia: Resolved. 3. Type 2 diabetes mellitus: Well controlled inpatient on current regimen. 4. Hypertension: Controlled. 5. Tobacco abuse: 6. No clinical evidence to suggest pneumonia at this  time. No antibiotics initiated.  Will sign off at this time. Please call if any further assistance is required. Thank you.    HONGALGI,ANAND 10/10/2011, 11:43 AM

## 2011-10-10 NOTE — Progress Notes (Signed)
Physical Therapy Treatment Patient Details Name: Mark [name] MRN: [id number] DOB: 21-Jul-1961 Today's Date: 10/10/2011  PT Assessment/Plan  PT - Assessment/Plan Comments on Treatment Session: Pt with copious amount of serosanguinous drainage from incision L hip.  RN notified.  Mild SOB noted following ambulation.  Pt demo increased cadence. PT Plan: Discharge plan remains appropriate PT Frequency: 7X/week Follow Up Recommendations: Skilled nursing facility Equipment Recommended: Defer to next venue PT Goals  Acute Rehab PT Goals PT Goal Formulation: With patient Pt will go Supine/Side to Sit: with min assist PT Goal: Supine/Side to Sit - Progress: Progressing toward goal PT Goal: Sit to Stand - Progress: Progressing toward goal PT Goal: Stand to Sit - Progress: Progressing toward goal PT Goal: Ambulate - Progress: Progressing toward goal  PT Treatment Precautions/Restrictions  Precautions Precautions: Posterior Hip Precaution Booklet Issued: Yes (comment) (posted in room) Precaution Comments: 3/3 hip precautions Required Braces or Orthoses: No Restrictions Weight Bearing Restrictions: Yes LLE Weight Bearing: Weight bearing as tolerated Mobility (including Balance) Bed Mobility Supine to Sit: 4: Min assist;HOB elevated (Comment degrees) (30 degrees) Supine to Sit Details (indicate cue type and reason): verbal cues for sequencing, precautions Transfers Sit to Stand: 4: Min assist;From bed Sit to Stand Details (indicate cue type and reason): verbal cues for hand placement Stand to Sit: 4: Min assist Stand to Sit Details: verbal cues for sequencing Ambulation/Gait Ambulation/Gait Assistance: 4: Min assist Ambulation/Gait Assistance Details (indicate cue type and reason): Min guard assist Ambulation Distance (Feet): 150 Feet Assistive device: Rolling walker Gait Pattern: Antalgic    Exercise    End of Session PT - End of Session Equipment Utilized During Treatment:  Gait belt Activity Tolerance: Patient tolerated treatment well Patient left: in chair General Behavior During Session: [facility] for tasks performed Cognition: [facility] for tasks performed  Lorriane Shire 10/10/2011, 12:11 PM  Lorrin Goodell, PT  Office # [phone] Pager [phone]

## 2011-10-11 LAB — GLUCOSE, CAPILLARY: Glucose-Capillary: 97 mg/dL (ref 70–99)

## 2011-10-11 LAB — PROTIME-INR: Prothrombin Time: 23.7 seconds — ABNORMAL HIGH (ref 11.6–15.2)

## 2011-10-11 MED ORDER — WARFARIN SODIUM 2 MG PO TABS
2.0000 mg | ORAL_TABLET | Freq: Once | ORAL | Status: AC
  Administered 2011-10-11: 2 mg via ORAL
  Filled 2011-10-11: qty 1

## 2011-10-11 MED ORDER — COUMADIN BOOK
Freq: Once | Status: AC
  Administered 2011-10-11: 13:00:00
  Filled 2011-10-11: qty 1

## 2011-10-11 NOTE — Progress Notes (Signed)
Patient has selected SNF bed at [facility] and will be available tomorrow- will f/u in a.m. Eduard Clos, MSW, [place]

## 2011-10-11 NOTE — Progress Notes (Signed)
Occupational Therapy Treatment Patient Details Name: Mark [name] MRN: [id number] DOB: 1961/03/04 Today's Date: 10/11/2011  OT Assessment/Plan OT Assessment/Plan OT Plan: Discharge plan remains appropriate OT Frequency: Min 2X/week Follow Up Recommendations: Skilled nursing facility Equipment Recommended: Defer to next venue OT Goals Acute Rehab OT Goals Time For Goal Achievement: 7 days ADL Goals ADL Goal: Grooming - Progress: Progressing toward goals ADL Goal: Lower Body Bathing - Progress: Progressing toward goals ADL Goal: Lower Body Dressing - Progress: Progressing toward goals ADL Goal: Toilet Transfer - Progress: Progressing toward goals ADL Goal: Toileting - Clothing Manipulation - Progress: Progressing toward goals ADL Goal: Toileting - Hygiene - Progress: Progressing toward goals Miscellaneous OT Goals OT Goal: Miscellaneous Goal #1 - Progress: Progressing toward goals  OT Treatment Precautions/Restrictions  Precautions Precautions: Posterior Hip Precaution Booklet Issued: Yes (comment) Precaution Comments: pt. able to verbalize and follow all precautions Required Braces or Orthoses: No Restrictions Weight Bearing Restrictions: Yes LLE Weight Bearing: Weight bearing as tolerated   ADL ADL Toilet Transfer: Chartered loss adjuster Details (indicate cue type and reason): vcs for backing all the way up to 3-n-1 before reaching for arm rests  Toilet Transfer Method: Counselling psychologist: Extra wide bedside commode Toileting - Clothing Manipulation: Performed;Modified independent Where Assessed - Toileting Clothing Manipulation: Standing Toileting - Hygiene: Simulated;Supervision/safety Where Assessed - Toileting Hygiene: Standing ADL Comments: pt. relies heavy on use of arm rests when going from sit to stand Mobility  Bed Mobility Bed Mobility: No Transfers Transfers: Yes Sit to Stand: With upper extremity assist;With  armrests;From chair/3-in-1 (min guard assist) Sit to Stand Details (indicate cue type and reason): Cues for safety Stand to Sit: Without upper extremity assist;With armrests;To chair/3-in-1;Other (comment) (minguard a) Stand to Sit Details: cues to slide out LLE when going to sitting Exercises Total Joint Exercises Heel Slides: AROM;Strengthening;Left;10 reps Hip ABduction/ADduction: AROM;Strengthening;Left;10 reps Long Arc Quad: AROM;Strengthening;Left;10 reps  End of Session OT - End of Session Activity Tolerance: Patient tolerated treatment well Patient left: in chair;with call bell in reach General Behavior During Session: [facility] for tasks performed Cognition: [facility] for tasks performed  Janice Coffin COTA/L 10/11/2011, 2:22 PM

## 2011-10-11 NOTE — Plan of Care (Signed)
Problem: Phase III Progression Outcomes Goal: Incision clean - minimal/no drainage Outcome: Not Progressing Copious amt serousangunious drainage noted to old dsg

## 2011-10-11 NOTE — Progress Notes (Signed)
Physical Therapy Treatment Patient Details Name: Mark [name] MRN: [id number] DOB: [birth year] Today's Date: 10/11/2011 GOALS NOT UPDATED. PT TO DC TO SNF PT Assessment/Plan  PT - Assessment/Plan PT Plan: Discharge plan remains appropriate PT Frequency: 7X/week Follow Up Recommendations: Skilled nursing facility Equipment Recommended: Defer to next venue PT Goals  Acute Rehab PT Goals PT Goal: Sit to Stand - Progress: Met PT Goal: Stand to Sit - Progress: Met PT Goal: Ambulate - Progress: Met PT Goal: Perform Home Exercise Program - Progress: Met  PT Treatment Precautions/Restrictions  Precautions Precautions: Posterior Hip Precaution Booklet Issued: Yes (comment) Precaution Comments: Pt able to verbalize and follow all precautions with mobility Required Braces or Orthoses: No Restrictions Weight Bearing Restrictions: Yes LLE Weight Bearing: Weight bearing as tolerated Mobility (including Balance) Transfers Transfers: Yes Sit to Stand: With armrests;With upper extremity assist;From chair/3-in-1;Other (comment) (MinGuard A) Sit to Stand Details (indicate cue type and reason): Cues for safety Stand to Sit: Without upper extremity assist;With armrests;To chair/3-in-1;Other (comment) (MinGuard A) Stand to Sit Details: Cues to slide out LLE Ambulation/Gait Ambulation/Gait: Yes Ambulation/Gait Assistance: Other (comment) (MinGuard A) Ambulation/Gait Assistance Details (indicate cue type and reason): Cues for step through gait.  Ambulation Distance (Feet): 180 Feet Assistive device: Rolling walker Gait Pattern: Step-through pattern;Decreased stride length Stairs: No Wheelchair Mobility Wheelchair Mobility: No    Exercise  Total Joint Exercises Heel Slides: AROM;Strengthening;Left;10 reps Hip ABduction/ADduction: AROM;Strengthening;Left;10 reps Long Arc Quad: AROM;Strengthening;Left;10 reps End of Session PT - End of Session Equipment Utilized During Treatment: Gait  belt Activity Tolerance: Patient tolerated treatment well Patient left: in chair Nurse Communication: Mobility status for transfers;Mobility status for ambulation General Behavior During Session: [facility] for tasks performed Cognition: [facility] for tasks performed  Yao [place], Tonia Brooms 10/11/2011, 1:00 PM  10/11/2011 Jacqualyn Posey PTA [phone] pager [phone] office

## 2011-10-11 NOTE — Progress Notes (Signed)
PATIENT ID: Mark [name]  MRN: [id number]  DOB/AGE:  Dec 22, 1960 / 50 y.o.  5 Days Post-Op Procedure(s) (LRB): TOTAL HIP ARTHROPLASTY (Left)    PROGRESS NOTE Subjective: Patient is alert, oriented,noNausea, no Vomiting, yes passing gas, yes Bowel Movement. Taking PO well. Denies SOB, Chest or Calf Pain. Using Incentive Spirometer, PAS in place. Ambulating with PT. Patient reports pain as moderate  .    Objective: Vital signs in last 24 hours: Filed Vitals:   10/10/11 2155 10/11/11 0000 10/11/11 0241 10/11/11 0610  BP: 112/58   131/78  Pulse: 117   104  Temp: 101.2 F (38.4 C) 102 F (38.9 C) 100.6 F (38.1 C) 99.7 F (37.6 C)  TempSrc: Oral   Oral  Resp: 20   20  Height:      Weight:      SpO2: 98%   99%      Intake/Output from previous day: I/O last 3 completed shifts: In: -  Out: 1350 [Urine:1350]   Intake/Output this shift:     LABORATORY DATA:  Basename 10/11/11 1142 10/11/11 0619 10/11/11 0500 10/10/11 2231 10/10/11 0600 10/09/11 0600  WBC -- -- -- -- 6.1 7.0  HGB -- -- -- -- 7.7* 7.5*  HCT -- -- -- -- 22.1* 21.8*  PLT -- -- -- -- 176 138*  NA -- -- -- -- -- --  K -- -- -- -- -- --  CL -- -- -- -- -- --  CO2 -- -- -- -- -- --  BUN -- -- -- -- -- --  CREATININE -- -- -- -- -- --  GLUCOSE -- -- -- -- -- --  GLUCAP 97 91 -- 90 -- --  INR -- -- 2.07* -- 2.06* --  CALCIUM -- -- -- -- -- --    Examination: Neurologically intact ABD soft Neurovascular intact Sensation intact distally Incision: dressing C/D/I} XR AP&Lat of hip shows well placed\fixed THA  Assessment:   5 Days Post-Op Procedure(s) (LRB): TOTAL HIP ARTHROPLASTY (Left) ADDITIONAL DIAGNOSIS:  Diabetes  Plan: PT/OT WBAT, THA  posterior precautions  DVT Prophylaxis: Lovenox\Coumadin bridge, monitor INR 1.5-2.0 target  DISCHARGE PLAN: Skilled Nursing Facility/Rehab  DISCHARGE NEEDS: HHPT, HHRN, Walker and 3-in-1 comode seat

## 2011-10-11 NOTE — Discharge Summary (Signed)
Patient ID: Mark [name] MRN: [id number] DOB/AGE: 52-Jan-1962 52 y.o.  Admit date: 10/06/2011 Discharge date: 10/11/2011  Admission Diagnoses:  Principal Problem:  *Post-traumatic osteoarthritis of left hip   Discharge Diagnoses:  Same  Past Medical History  Diagnosis Date  . Gout   . Osteoporosis   . Hypertension   . Diabetes mellitus   . Bleeding ulcer     hx of, required 6 units blood  . Blood transfusion     6 units with ulcer repair surgery  . Anemia     labs today indicate (09/27/11)  . Arthritis     right knee    Surgeries: Procedure(s): TOTAL HIP ARTHROPLASTY on 10/06/2011   Consultants:    Discharged Condition: Improved  Hospital Course: Mark [name] is an 50 y.o. male who was admitted 10/06/2011 for operative treatment ofPost-traumatic osteoarthritis of left hip. Patient has severe unremitting pain that affects sleep, daily activities, and work/hobbies. After pre-op clearance the patient was taken to the operating room on 10/06/2011 and underwent  Procedure(s): TOTAL HIP ARTHROPLASTY.    Patient was given perioperative antibiotics: Anti-infectives     Start     Dose/Rate Route Frequency Ordered Stop   10/06/11 1300   vancomycin (VANCOCIN) 1,500 mg in sodium chloride 0.9 % 500 mL IVPB        1,500 mg 250 mL/hr over 120 Minutes Intravenous To Surgery 10/06/11 1251 10/06/11 1305   10/05/11 1515   ceFAZolin (ANCEF) IVPB 2 g/50 mL premix  Status:  Discontinued        2 g 100 mL/hr over 30 Minutes Intravenous 60 min pre-op 10/05/11 1510 10/06/11 1915           Patient was given sequential compression devices, early ambulation, and chemoprophylaxis to prevent DVT.  Patient benefited maximally from hospital stay and there were no complications.    Recent vital signs: Patient Vitals for the past 24 hrs:  BP Temp Temp src Pulse Resp SpO2  10/11/11 0610 131/78 mmHg 99.7 F (37.6 C) Oral 104  20  99 %  10/11/11 0241 - 100.6 F (38.1 C) - - - -    10/11/11 0000 - 102 F (38.9 C) - - - -  10/30/2011 2155 112/58 mmHg 101.2 F (38.4 C) Oral 117  20  98 %  30-Oct-2011 1400 102/65 mmHg 99.2 F (37.3 C) Oral 103  20  99 %     Recent laboratory studies:  Basename 10/11/11 0500 30-Oct-2011 0600 10/09/11 0600  WBC -- 6.1 7.0  HGB -- 7.7* 7.5*  HCT -- 22.1* 21.8*  PLT -- 176 138*  NA -- -- --  K -- -- --  CL -- -- --  CO2 -- -- --  BUN -- -- --  CREATININE -- -- --  GLUCOSE -- -- --  INR 2.07* 2.06* --  CALCIUM -- -- --     Discharge Medications:  Current Discharge Medication List    START taking these medications   Details  oxyCODONE-acetaminophen (ROXICET) 5-325 MG per tablet Take 1-2 tablets by mouth every 4 (four) hours as needed for pain. Qty: 60 tablet, Refills: 0    warfarin (COUMADIN) 5 MG tablet Take 1.5 tablets (7.5 mg total) by mouth one time only at 6 PM. Take as directed by home health.  Target INR 1.5-2.0 Qty: 30 tablet, Refills: 0      CONTINUE these medications which have NOT CHANGED   Details  allopurinol (ZYLOPRIM) 300 MG tablet Take 300 mg  by mouth daily.      Cholecalciferol (VITAMIN D) 2000 UNITS tablet Take 2,000 Units by mouth daily.      lisinopril (PRINIVIL,ZESTRIL) 10 MG tablet Take 10 mg by mouth daily.      metFORMIN (GLUCOPHAGE) 500 MG tablet Take 1,000 mg by mouth 2 (two) times daily with a meal.      teriparatide (FORTEO) 750 MCG/3ML injection Inject 20 mcg into the skin daily.          Diagnostic Studies: Dg Chest 2 View  09/27/2011  *RADIOLOGY REPORT*  Clinical Data: Left total hip arthroplasty.  Hypertension. Diabetes.  Hypertension.  Smoker.  CHEST - 2 VIEW  Comparison: 12/02/2010  Findings: Mild hyperinflation. Lateral view degraded by patient arm position.  Midline trachea.  Normal heart size and mediastinal contours. No pleural effusion or pneumothorax.  Mild linear scarring within the left midlung on the frontal.  Likely left lower lobe.  Lungs are otherwise clear.  IMPRESSION: No  acute cardiopulmonary disease.  Original Report Authenticated By: Areta Haber, M.D.   Dg Pelvis Portable  10/06/2011  *RADIOLOGY REPORT*  Clinical Data: Status post left hip arthroplasty.  PORTABLE PELVIS  Comparison: CT dated 07/27/2011  Findings: Bipolar hemiarthroplasty shows normal alignment.  No evidence of acute fracture surrounding the prosthesis.  IMPRESSION: Normal alignment status post placement of left bipolar hemiarthroplasty.  Original Report Authenticated By: Azzie Roup, M.D.   Dg Chest Port 1 View  10/09/2011  *RADIOLOGY REPORT*  Clinical Data: Leukocytosis and fever.  PORTABLE CHEST - 1 VIEW  Comparison: Chest radiograph performed 09/27/2011  Findings: The lungs are well-aerated.  Patchy bibasilar airspace opacities raise concern for mild pneumonia.  There is no evidence of pleural effusion or pneumothorax.  The cardiomediastinal silhouette is mildly enlarged.  No acute osseous abnormalities are seen.  IMPRESSION: Patchy bibasilar airspace opacities raise concern for mild pneumonia.  Mild cardiomegaly noted.  Findings were discussed with Garner Gavel RN on [id number] at 05:26 a.m. on 10/09/2011.  Original Report Authenticated By: Santa Lighter, M.D.    Disposition:   Discharge Orders    Future Orders Please Complete By Expires   Diet - low sodium heart healthy      Diet - low sodium heart healthy      Increase activity slowly      Walker       May shower / Bathe      Driving Restrictions      Comments:   No driving for 2 weeks.   Change dressing (specify)      Comments:   Dressing change as needed.   Call MD for:  temperature >100.4      Call MD for:  severe uncontrolled pain      Call MD for:  redness, tenderness, or signs of infection (pain, swelling, redness, odor or green/yellow discharge around incision site)      Discharge instructions      Comments:   F/U IN 10-12 DAYS   Increase activity slowly      Walker       May shower / Bathe      Driving  Restrictions      Comments:   No driving for 2 weeks.   Change dressing (specify)      Comments:   Dressing change as needed.   Call MD for:  temperature >100.4      Call MD for:  severe uncontrolled pain      Call MD for:  redness,  tenderness, or signs of infection (pain, swelling, redness, odor or green/yellow discharge around incision site)      Discharge instructions      Comments:   F/U in 8-10 days         Signed: Verlon Au. 10/11/2011, 1:42 PM

## 2011-10-11 NOTE — Progress Notes (Signed)
ANTICOAGULATION CONSULT NOTE - Follow Up Consult  Pharmacy Consult for Coumadin  Indication: VTE prophylaxis   Allergies  Allergen Reactions  . Penicillins Swelling    Arm swells & breaks out    Vital Signs: Temp: 99.7 F (37.6 C) (12/03 0610) Temp src: Oral (12/03 0610) BP: 131/78 mmHg (12/03 0610) Pulse Rate: 104  (12/03 0610)  Labs:  Basename 10/11/11 0500 10/10/11 0600 10/09/11 0600 10/08/11 1610  HGB -- 7.7* 7.5* --  HCT -- 22.1* 21.8* 26.1*  PLT -- 176 138* --  APTT -- -- -- --  LABPROT 23.7* 23.6* 22.4* --  INR 2.07* 2.06* 1.93* --  HEPARINUNFRC -- -- -- --  CREATININE -- -- -- --  CKTOTAL -- -- -- --  CKMB -- -- -- --  TROPONINI -- -- -- --   Estimated Creatinine Clearance: 105.5 ml/min (by C-G formula based on Cr of 1.2).   Assessment: 50 yo M on coumadin for DVT px s/p THA, INR 2.06.   Goal of Therapy:  INR 1.5-2 (per MD)   Plan:  Coumadin 2mg  po x 1 again today. F/u INR in am  Alford Highland, [employer] 10/11/2011,9:47 AM

## 2011-10-12 ENCOUNTER — Inpatient Hospital Stay (HOSPITAL_COMMUNITY): Payer: 59

## 2011-10-12 LAB — CBC
HCT: 23.9 % — ABNORMAL LOW (ref 39.0–52.0)
Hemoglobin: 8 g/dL — ABNORMAL LOW (ref 13.0–17.0)
MCH: 31.3 pg (ref 26.0–34.0)
MCHC: 33.5 g/dL (ref 30.0–36.0)
MCV: 93.4 fL (ref 78.0–100.0)
RDW: 13.6 % (ref 11.5–15.5)

## 2011-10-12 LAB — PROTIME-INR
INR: 1.73 — ABNORMAL HIGH (ref 0.00–1.49)
Prothrombin Time: 20.6 seconds — ABNORMAL HIGH (ref 11.6–15.2)

## 2011-10-12 LAB — GLUCOSE, CAPILLARY: Glucose-Capillary: 102 mg/dL — ABNORMAL HIGH (ref 70–99)

## 2011-10-12 MED ORDER — WARFARIN SODIUM 5 MG PO TABS
5.0000 mg | ORAL_TABLET | Freq: Once | ORAL | Status: DC
  Filled 2011-10-12: qty 1

## 2011-10-12 NOTE — Progress Notes (Signed)
Patient ID: Mark [name], male   DOB: [birth year], 50 y.o.   MRN: [id number] PATIENT ID: Mark [name]  MRN: [id number]  DOB/AGE:  12-27-1960 / 50 y.o.  6 Days Post-Op Procedure(s) (LRB): TOTAL HIP ARTHROPLASTY (Left)    PROGRESS NOTE Subjective: Patient is alert, oriented,noNausea, no Vomiting, yes passing gas, yes Bowel Movement. Taking PO well. Denies SOB, reports occ clear sputum no wheezing, Chest or Calf Pain. Using Incentive Spirometer, PAS in place. Ambulate in hallway, able to sit stand on own Patient reports pain as 1 on 0-10 scale  .    Objective: Vital signs in last 24 hours: Filed Vitals:   10/11/11 0610 10/11/11 1529 10/11/11 2111 10/12/11 0700  BP: 131/78 140/73 123/69 124/66  Pulse: 104 113 114 115  Temp: 99.7 F (37.6 C) 100.3 F (37.9 C) 100.5 F (38.1 C) 99.1 F (37.3 C)  TempSrc: Oral  Oral Oral  Resp: 20 20 20 20   Height:      Weight:      SpO2: 99% 99% 100% 96%      Intake/Output from previous day: I/O last 3 completed shifts: In: 720 [P.O.:720] Out: 1200 [Urine:1200]   Intake/Output this shift:     LABORATORY DATA:  Basename 10/12/11 0624 10/12/11 0535 10/11/11 2132 10/11/11 1623 10/11/11 0500 10/10/11 0600  WBC -- -- -- -- -- 6.1  HGB -- -- -- -- -- 7.7*  HCT -- -- -- -- -- 22.1*  PLT -- -- -- -- -- 176  NA -- -- -- -- -- --  K -- -- -- -- -- --  CL -- -- -- -- -- --  CO2 -- -- -- -- -- --  BUN -- -- -- -- -- --  CREATININE -- -- -- -- -- --  GLUCOSE -- -- -- -- -- --  GLUCAP 111* -- 102* 99 -- --  INR -- 1.73* -- -- 2.07* --  CALCIUM -- -- -- -- -- --    Examination: Neurologically intact ABD soft Neurovascular intact Sensation intact distally Intact pulses distally Dorsiflexion/Plantar flexion intact Incision: scant drainage and clear <10%, new dressing applied by me No cellulitis present Compartment soft} 10/09/11 CXR showed patchy basilar lines c/w atelectasis vs pneumonia.  Assessment:   6 Days Post-Op Procedure(s)  (LRB): TOTAL HIP ARTHROPLASTY (Left) ADDITIONAL DIAGNOSIS:  Diabetes, Hypertension and morbid super obesity  Plan: PT/OT WBAT, THA  posterior precautions Will check repeat CXR, CBC today DVT Prophylaxis: Lovenox\Coumadin bridge, monitor INR 1.5-2.0 target  DISCHARGE PLAN: Skilled Nursing Facility/Rehab  DISCHARGE NEEDS: HHPT, HHRN, Walker and 3-in-1 comode seat

## 2011-10-12 NOTE — Progress Notes (Signed)
ANTICOAGULATION CONSULT NOTE - Follow Up Consult  Pharmacy Consult for Coumadin  Indication: VTE prophylaxis   Allergies  Allergen Reactions  . Penicillins Swelling    Arm swells & breaks out    Vital Signs: Temp: 99.1 F (37.3 C) (12/04 0700) Temp src: Oral (12/04 0700) BP: 124/66 mmHg (12/04 0700) Pulse Rate: 115  (12/04 0700)  Labs:  Basename 10/12/11 0847 10/12/11 0535 10/11/11 0500 10/10/11 0600  HGB 8.0* -- -- 7.7*  HCT 23.9* -- -- 22.1*  PLT 251 -- -- 176  APTT -- -- -- --  LABPROT -- 20.6* 23.7* 23.6*  INR -- 1.73* 2.07* 2.06*  HEPARINUNFRC -- -- -- --  CREATININE -- -- -- --  CKTOTAL -- -- -- --  CKMB -- -- -- --  TROPONINI -- -- -- --   Estimated Creatinine Clearance: 105.5 ml/min (by C-G formula based on Cr of 1.2).   Assessment: 50 yo M on coumadin for DVT px s/p THA, INR down to 1.73. Hgb 8.  Goal of Therapy:  INR 1.5-2 (per MD)   Plan:  Coumadin 5mg  today. F/u INR in am  Alford Highland, [employer] 10/12/2011,9:38 AM

## 2011-10-12 NOTE — Progress Notes (Signed)
Patient discharged  To [facility].

## 2011-10-12 NOTE — Progress Notes (Signed)
Physical Therapy Treatment Patient Details Name: Mark [name] MRN: [id number] DOB: 11-17-1960 Today's Date: 10/12/2011  PT Assessment/Plan  PT - Assessment/Plan Comments on Treatment Session: Pt progressing well. Plans to DC to [facility] today prior to going home alone.  PT Plan: Discharge plan remains appropriate PT Frequency: 7X/week Follow Up Recommendations: Skilled nursing facility Equipment Recommended: Defer to next venue PT Goals   Focused on increasing difficulty of TherEx. Goals not updated as patient leaving for [facility] this afternoon.   PT Treatment Precautions/Restrictions  Precautions Precautions: Posterior Hip Precaution Booklet Issued: Yes (comment) Precaution Comments: pt. able to verbalize and follow all precautions Required Braces or Orthoses: No Restrictions Weight Bearing Restrictions: Yes LLE Weight Bearing: Weight bearing as tolerated Mobility (including Balance) Transfers Transfers: Yes Sit to Stand: 5: Supervision;From chair/3-in-1;With armrests;With upper extremity assist Stand to Sit: 5: Supervision;With upper extremity assist;With armrests;To chair/3-in-1 Stand to Sit Details: Cues to slide out LLE Ambulation/Gait Ambulation/Gait: Yes Ambulation/Gait Assistance: 5: Supervision Ambulation/Gait Assistance Details (indicate cue type and reason): Cues for posture Ambulation Distance (Feet): 300 Feet Assistive device: Rolling walker Gait Pattern: Step-through pattern;Antalgic    Exercise  Total Joint Exercises Hip ABduction/ADduction: AROM;Strengthening;Left;10 reps;Standing Knee Flexion: AROM;Strengthening;Left;10 reps;Standing Marching in Standing: AROM;Strengthening;Left;10 reps;Standing Standing Hip Extension: AROM;Strengthening;Left;10 reps End of Session PT - End of Session Equipment Utilized During Treatment: Gait belt Activity Tolerance: Patient tolerated treatment well Patient left: in chair;with call bell in reach Nurse  Communication: Mobility status for transfers;Mobility status for ambulation General Behavior During Session: [facility] for tasks performed Cognition: [facility] for tasks performed  [place], Tonia Brooms 10/12/2011, 11:30 AM 10/12/2011 Jacqualyn Posey PTA [phone] pager [phone] office

## 2011-10-12 NOTE — Progress Notes (Signed)
Patient has chosen SNF bed at [facility] and will be transferred there today. He is eager to continue his therapies/rehab in anticipation of getting home soon. Plan transfer via EMS. Eduard Clos, MSW, [place]

## 2013-01-17 ENCOUNTER — Encounter (HOSPITAL_COMMUNITY): Payer: Self-pay | Admitting: *Deleted

## 2013-01-17 ENCOUNTER — Emergency Department (HOSPITAL_COMMUNITY)
Admission: EM | Admit: 2013-01-17 | Discharge: 2013-01-17 | Disposition: A | Discharge status: Home / Self Care | Payer: 59 | Source: Home / Self Care | Attending: Family Medicine | Admitting: Family Medicine

## 2013-01-17 DIAGNOSIS — Z720 Tobacco use: Secondary | ICD-10-CM

## 2013-01-17 MED ORDER — HYDROCOD POLST-CHLORPHEN POLST 10-8 MG/5ML PO LQCR: 5.0000 mL | Freq: Two times a day (BID) | ORAL | Status: DC

## 2013-01-17 MED ORDER — MINOCYCLINE HCL 100 MG PO CAPS: 100.0000 mg | ORAL_CAPSULE | Freq: Two times a day (BID) | ORAL | Status: DC

## 2013-01-17 NOTE — ED Provider Notes (Signed)
History     CSN: [id number]  Arrival date & time 01/17/13  1324   First MD Initiated Contact with Patient 01/17/13 1342      Chief Complaint  Patient presents with  . Sore Throat    (Consider location/radiation/quality/duration/timing/severity/associated sxs/prior treatment) Patient is a 52 y.o. male presenting with cough. The history is provided by the patient.  Cough Cough characteristics:  Productive Sputum characteristics:  Yellow and brown Severity:  Moderate Onset quality:  Gradual Duration:  3 days Progression:  Worsening Chronicity:  New Smoker: yes   Context: sick contacts   Associated symptoms: sore throat   Associated symptoms: no fever, no rhinorrhea, no shortness of breath and no sinus congestion     Past Medical History  Diagnosis Date  . Gout   . Osteoporosis   . Hypertension   . Diabetes mellitus   . Bleeding ulcer     hx of, required 6 units blood  . Blood transfusion     6 units with ulcer repair surgery  . Anemia     labs today indicate (09/27/11)  . Arthritis     right knee    Past Surgical History  Procedure Laterality Date  . Joint replacement    . Other surgical history  ~ 08/1998    repair bleeding ulcer  . Colonoscopy    . Total hip arthroplasty  10/06/11    left  . Hip pinning  11/2010  . Total hip arthroplasty  10/06/2011    Procedure: TOTAL HIP ARTHROPLASTY;  Surgeon: Kerin Salen;  Location: [facility];  Service: Orthopedics;  Laterality: Left;    No family history on file.  History  Substance Use Topics  . Smoking status: Current Some Day Smoker -- 0.25 packs/day for 32 years    Types: Cigarettes  . Smokeless tobacco: Never Used     Comment: smoking cessation consult entered  . Alcohol Use: 3.0 oz/week    5 Cans of beer per week      Review of Systems  Constitutional: Negative.  Negative for fever.  HENT: Positive for sore throat. Negative for congestion, rhinorrhea and postnasal drip.   Respiratory: Positive for  cough. Negative for shortness of breath.   Cardiovascular: Negative.   Gastrointestinal: Negative.     Allergies  Penicillins  Home Medications   Current Outpatient Rx  Name  Route  Sig  Dispense  Refill  . allopurinol (ZYLOPRIM) 300 MG tablet   Oral   Take 300 mg by mouth daily.           . chlorpheniramine-HYDROcodone (TUSSIONEX PENNKINETIC ER) 10-8 MG/5ML LQCR   Oral   Take 5 mLs by mouth every 12 (twelve) hours. Prn cough   115 mL   0   . Cholecalciferol (VITAMIN D) 2000 UNITS tablet   Oral   Take 2,000 Units by mouth daily.           Marland Kitchen lisinopril (PRINIVIL,ZESTRIL) 10 MG tablet   Oral   Take 10 mg by mouth daily.           . metFORMIN (GLUCOPHAGE) 500 MG tablet   Oral   Take 1,000 mg by mouth 2 (two) times daily with a meal.           . minocycline (MINOCIN,DYNACIN) 100 MG capsule   Oral   Take 1 capsule (100 mg total) by mouth 2 (two) times daily.   14 capsule   0   . teriparatide (FORTEO) 750 MCG/3ML injection  Subcutaneous   Inject 20 mcg into the skin daily.           Marland Kitchen EXPIRED: warfarin (COUMADIN) 5 MG tablet   Oral   Take 1.5 tablets (7.5 mg total) by mouth one time only at 6 PM. Take as directed by home health.  Target INR 1.5-2.0   30 tablet   0     BP 102/74  Pulse 78  Temp(Src) 98.6 F (37 C) (Oral)  SpO2 100%  Physical Exam  Nursing note and vitals reviewed. Constitutional: He is oriented to person, place, and time. He appears well-developed and well-nourished. No distress.  HENT:  Head: Normocephalic.  Right Ear: External ear normal.  Left Ear: External ear normal.  Mouth/Throat: Oropharynx is clear and moist.  Eyes: Pupils are equal, round, and reactive to light.  Neck: Normal range of motion. Neck supple.  Cardiovascular: Normal rate, regular rhythm, normal heart sounds and intact distal pulses.   Pulmonary/Chest: Effort normal. He has rhonchi.  Abdominal: Soft. Bowel sounds are normal.  Neurological: He is alert and  oriented to person, place, and time.  Skin: Skin is warm and dry.    ED Course  Procedures (including critical care time)  Labs Reviewed  POCT RAPID STREP A ([place])   No results found.   1. Bronchitis due to tobacco use       MDM          Billy Fischer, MD 01/17/13 2004

## 2015-02-17 ENCOUNTER — Other Ambulatory Visit (HOSPITAL_COMMUNITY): Payer: Self-pay | Admitting: Nephrology

## 2015-02-17 DIAGNOSIS — N183 Chronic kidney disease, stage 3 unspecified: Secondary | ICD-10-CM

## 2015-02-17 DIAGNOSIS — N2581 Secondary hyperparathyroidism of renal origin: Secondary | ICD-10-CM

## 2015-02-25 ENCOUNTER — Encounter (HOSPITAL_COMMUNITY): Payer: 59

## 2015-02-25 ENCOUNTER — Ambulatory Visit (HOSPITAL_COMMUNITY)
Admission: RE | Admit: 2015-02-25 | Discharge: 2015-02-25 | Disposition: A | Discharge status: Home / Self Care | Payer: 59 | Source: Ambulatory Visit | Attending: Nephrology | Admitting: Nephrology

## 2015-02-25 DIAGNOSIS — N189 Chronic kidney disease, unspecified: Secondary | ICD-10-CM | POA: Diagnosis not present

## 2015-02-25 DIAGNOSIS — N183 Chronic kidney disease, stage 3 unspecified: Secondary | ICD-10-CM

## 2015-02-25 DIAGNOSIS — N281 Cyst of kidney, acquired: Secondary | ICD-10-CM | POA: Insufficient documentation

## 2015-04-02 ENCOUNTER — Other Ambulatory Visit (HOSPITAL_COMMUNITY): Payer: Self-pay | Admitting: *Deleted

## 2015-04-03 ENCOUNTER — Encounter (HOSPITAL_COMMUNITY)
Admission: RE | Admit: 2015-04-03 | Discharge: 2015-04-03 | Disposition: A | Discharge status: Home / Self Care | Payer: 59 | Source: Ambulatory Visit | Attending: Nephrology | Admitting: Nephrology

## 2015-04-03 DIAGNOSIS — N183 Chronic kidney disease, stage 3 (moderate): Secondary | ICD-10-CM | POA: Diagnosis present

## 2015-04-03 DIAGNOSIS — Z79899 Other long term (current) drug therapy: Secondary | ICD-10-CM | POA: Insufficient documentation

## 2015-04-03 DIAGNOSIS — Z5181 Encounter for therapeutic drug level monitoring: Secondary | ICD-10-CM | POA: Diagnosis not present

## 2015-04-03 DIAGNOSIS — D631 Anemia in chronic kidney disease: Secondary | ICD-10-CM | POA: Insufficient documentation

## 2015-04-03 LAB — POCT HEMOGLOBIN-HEMACUE: HEMOGLOBIN: 11.3 g/dL — AB (ref 13.0–17.0)

## 2015-04-03 MED ORDER — DARBEPOETIN ALFA 100 MCG/0.5ML IJ SOSY
100.0000 ug | PREFILLED_SYRINGE | INTRAMUSCULAR | Status: DC
  Administered 2015-04-03: 100 ug via SUBCUTANEOUS

## 2015-04-03 MED ORDER — DARBEPOETIN ALFA 100 MCG/0.5ML IJ SOSY
PREFILLED_SYRINGE | INTRAMUSCULAR | Status: AC
  Filled 2015-04-03: qty 0.5

## 2015-04-03 NOTE — Discharge Instructions (Signed)
Darbepoetin Alfa injection What is this medicine? DARBEPOETIN ALFA (dar be POE e tin AL fa) helps your body make more red blood cells. It is used to treat anemia caused by chronic kidney failure and chemotherapy. This medicine may be used for other purposes; ask your health care provider or pharmacist if you have questions. COMMON BRAND NAME(S): Aranesp What should I tell my health care provider before I take this medicine? They need to know if you have any of these conditions: -blood clotting disorders or history of blood clots -cancer patient not on chemotherapy -cystic fibrosis -heart disease, such as angina, heart failure, or a history of a heart attack -hemoglobin level of 12 g/dL or greater -high blood pressure -low levels of folate, iron, or vitamin B12 -seizures -an unusual or allergic reaction to darbepoetin, erythropoietin, albumin, hamster proteins, latex, other medicines, foods, dyes, or preservatives -pregnant or trying to get pregnant -breast-feeding How should I use this medicine? This medicine is for injection into a vein or under the skin. It is usually given by a health care professional in a hospital or clinic setting. If you get this medicine at home, you will be taught how to prepare and give this medicine. Do not shake the solution before you withdraw a dose. Use exactly as directed. Take your medicine at regular intervals. Do not take your medicine more often than directed. It is important that you put your used needles and syringes in a special sharps container. Do not put them in a trash can. If you do not have a sharps container, call your pharmacist or healthcare provider to get one. Talk to your pediatrician regarding the use of this medicine in children. While this medicine may be used in children as young as 1 year for selected conditions, precautions do apply. Overdosage: If you think you have taken too much of this medicine contact a poison control center or  emergency room at once. NOTE: This medicine is only for you. Do not share this medicine with others. What if I miss a dose? If you miss a dose, take it as soon as you can. If it is almost time for your next dose, take only that dose. Do not take double or extra doses. What may interact with this medicine? Do not take this medicine with any of the following medications: -epoetin alfa This list may not describe all possible interactions. Give your health care provider a list of all the medicines, herbs, non-prescription drugs, or dietary supplements you use. Also tell them if you smoke, drink alcohol, or use illegal drugs. Some items may interact with your medicine. What should I watch for while using this medicine? Visit your prescriber or health care professional for regular checks on your progress and for the needed blood tests and blood pressure measurements. It is especially important for the doctor to make sure your hemoglobin level is in the desired range, to limit the risk of potential side effects and to give you the best benefit. Keep all appointments for any recommended tests. Check your blood pressure as directed. Ask your doctor what your blood pressure should be and when you should contact him or her. As your body makes more red blood cells, you may need to take iron, folic acid, or vitamin B supplements. Ask your doctor or health care provider which products are right for you. If you have kidney disease continue dietary restrictions, even though this medication can make you feel better. Talk with your doctor or health   care professional about the foods you eat and the vitamins that you take. What side effects may I notice from receiving this medicine? Side effects that you should report to your doctor or health care professional as soon as possible: -allergic reactions like skin rash, itching or hives, swelling of the face, lips, or tongue -breathing problems -changes in vision -chest  pain -confusion, trouble speaking or understanding -feeling faint or lightheaded, falls -high blood pressure -muscle aches or pains -pain, swelling, warmth in the leg -rapid weight gain -severe headaches -sudden numbness or weakness of the face, arm or leg -trouble walking, dizziness, loss of balance or coordination -seizures (convulsions) -swelling of the ankles, feet, hands -unusually weak or tired Side effects that usually do not require medical attention (report to your doctor or health care professional if they continue or are bothersome): -diarrhea -fever, chills (flu-like symptoms) -headaches -nausea, vomiting -redness, stinging, or swelling at site where injected This list may not describe all possible side effects. Call your doctor for medical advice about side effects. You may report side effects to FDA at 1-800-FDA-1088. Where should I keep my medicine? Keep out of the reach of children. Store in a refrigerator between 2 and 8 degrees C (36 and 46 degrees F). Do not freeze. Do not shake. Throw away any unused portion if using a single-dose vial. Throw away any unused medicine after the expiration date. NOTE: This sheet is a summary. It may not cover all possible information. If you have questions about this medicine, talk to your doctor, pharmacist, or health care provider.  2015, Elsevier/Gold Standard. (2008-10-08 10:23:57)  

## 2015-05-01 ENCOUNTER — Encounter (HOSPITAL_COMMUNITY)
Admission: RE | Admit: 2015-05-01 | Discharge: 2015-05-01 | Disposition: A | Discharge status: Home / Self Care | Payer: 59 | Source: Ambulatory Visit | Attending: Nephrology | Admitting: Nephrology

## 2015-05-01 DIAGNOSIS — N183 Chronic kidney disease, stage 3 (moderate): Secondary | ICD-10-CM | POA: Insufficient documentation

## 2015-05-01 DIAGNOSIS — Z79899 Other long term (current) drug therapy: Secondary | ICD-10-CM | POA: Insufficient documentation

## 2015-05-01 DIAGNOSIS — Z5181 Encounter for therapeutic drug level monitoring: Secondary | ICD-10-CM | POA: Diagnosis not present

## 2015-05-01 DIAGNOSIS — D631 Anemia in chronic kidney disease: Secondary | ICD-10-CM | POA: Insufficient documentation

## 2015-05-01 LAB — IRON AND TIBC
IRON: 122 ug/dL (ref 45–182)
Saturation Ratios: 35 % (ref 17.9–39.5)
TIBC: 350 ug/dL (ref 250–450)
UIBC: 228 ug/dL

## 2015-05-01 LAB — FERRITIN: Ferritin: 157 ng/mL (ref 24–336)

## 2015-05-01 LAB — POCT HEMOGLOBIN-HEMACUE: Hemoglobin: 12.3 g/dL — ABNORMAL LOW (ref 13.0–17.0)

## 2015-05-01 MED ORDER — DARBEPOETIN ALFA 100 MCG/0.5ML IJ SOSY: 100.0000 ug | PREFILLED_SYRINGE | INTRAMUSCULAR | Status: DC

## 2015-05-16 ENCOUNTER — Encounter (HOSPITAL_COMMUNITY)
Admission: RE | Admit: 2015-05-16 | Discharge: 2015-05-16 | Disposition: A | Discharge status: Home / Self Care | Payer: 59 | Source: Ambulatory Visit | Attending: Nephrology | Admitting: Nephrology

## 2015-05-16 DIAGNOSIS — Z79899 Other long term (current) drug therapy: Secondary | ICD-10-CM | POA: Insufficient documentation

## 2015-05-16 DIAGNOSIS — D631 Anemia in chronic kidney disease: Secondary | ICD-10-CM | POA: Insufficient documentation

## 2015-05-16 DIAGNOSIS — N183 Chronic kidney disease, stage 3 (moderate): Secondary | ICD-10-CM | POA: Diagnosis not present

## 2015-05-16 DIAGNOSIS — Z5181 Encounter for therapeutic drug level monitoring: Secondary | ICD-10-CM | POA: Insufficient documentation

## 2015-05-16 LAB — POCT HEMOGLOBIN-HEMACUE: HEMOGLOBIN: 12.2 g/dL — AB (ref 13.0–17.0)

## 2015-05-16 MED ORDER — DARBEPOETIN ALFA 100 MCG/0.5ML IJ SOSY: 100.0000 ug | PREFILLED_SYRINGE | INTRAMUSCULAR | Status: DC

## 2015-05-30 ENCOUNTER — Encounter (HOSPITAL_COMMUNITY): Payer: 59

## 2015-06-19 ENCOUNTER — Encounter: Payer: Self-pay | Admitting: Cardiovascular Disease

## 2015-06-19 ENCOUNTER — Ambulatory Visit (INDEPENDENT_AMBULATORY_CARE_PROVIDER_SITE_OTHER): Payer: 59 | Admitting: Cardiovascular Disease

## 2015-06-19 VITALS — BP 128/90 | HR 93 | Ht 68.0 in | Wt 344.0 lb

## 2015-06-19 DIAGNOSIS — I10 Essential (primary) hypertension: Secondary | ICD-10-CM | POA: Diagnosis not present

## 2015-06-19 NOTE — Progress Notes (Signed)
06/19/2015 [place]   1961-04-10  [id number]  Primary Physician KERR,JEFFREY, MD Primary Cardiologist: Lorretta Harp MD Renae Gloss   HPI:  Mark [name] is a 54 year old morbidly overweight single African-American male with no children his retired from working at [employer]. He was referred by Dr. Lorrene Reid for cardiovascular evaluation because of mildly elevated resting heart rate. He also sees Dr. Dagmar Hait for endocrinology. His cardiac risk factor profile is notable for 20-pack-years of tobacco abuse. Smoking only occasionally. He has treated hypertension. He's never had a heart attack or stroke, denies chest pain or shortness of breath. There is no family history. Otherwise he's had peptic ulcer disease in the distant past with bleeding and surgical correction. He is on carvedilol once a day. He denies palpitations or presyncope. I suspect he simply has a resting high sinus rate which I suspect once he is his car carvedilol will come back down into the 70s or 80s.   Current Outpatient Prescriptions  Medication Sig Dispense Refill  . allopurinol (ZYLOPRIM) 300 MG tablet Take 300 mg by mouth daily.      . carvedilol (COREG) 25 MG tablet Take 25 mg by mouth 2 (two) times daily with a meal.    . furosemide (LASIX) 40 MG tablet TAKE 1 TABLET BY MOUTH TWICE A DAY IN THE MORNING AND EARLY TO MID- AFTERNOON  6  . SENSIPAR 30 MG tablet Take 30 mg by mouth daily.  5  . sodium bicarbonate 650 MG tablet Take 650 mg by mouth 3 (three) times daily.  5  . warfarin (COUMADIN) 5 MG tablet Take 1.5 tablets (7.5 mg total) by mouth one time only at 6 PM. Take as directed by home health.  Target INR 1.5-2.0 30 tablet 0   No current facility-administered medications for this visit.    Allergies  Allergen Reactions  . Penicillins Swelling    Arm swells & breaks out    Social History   Social History  . Marital Status: Single    Spouse Name: N/A  . Number of Children: N/A    . Years of Education: N/A   Occupational History  . Not on file.   Social History Main Topics  . Smoking status: Current Some Day Smoker -- 0.25 packs/day for 32 years    Types: Cigarettes  . Smokeless tobacco: Never Used     Comment: smoking cessation consult entered  . Alcohol Use: 3.0 oz/week    5 Cans of beer per week  . Drug Use: No  . Sexual Activity: Yes   Other Topics Concern  . Not on file   Social History Narrative     Review of Systems: General: negative for chills, fever, night sweats or weight changes.  Cardiovascular: negative for chest pain, dyspnea on exertion, edema, orthopnea, palpitations, paroxysmal nocturnal dyspnea or shortness of breath Dermatological: negative for rash Respiratory: negative for cough or wheezing Urologic: negative for hematuria Abdominal: negative for nausea, vomiting, diarrhea, bright red blood per rectum, melena, or hematemesis Neurologic: negative for visual changes, syncope, or dizziness All other systems reviewed and are otherwise negative except as noted above.    Blood pressure 128/90, pulse 93, height 5\' 8"  (1.727 m), weight 344 lb (156.037 kg).  General appearance: alert and no distress Neck: no adenopathy, no carotid bruit, no JVD, supple, symmetrical, trachea midline and thyroid not enlarged, symmetric, no tenderness/mass/nodules Lungs: clear to auscultation bilaterally Heart: regular rate and rhythm, S1, S2 normal, no  murmur, click, rub or gallop Extremities: extremities normal, atraumatic, no cyanosis or edema  EKG normal sinus rhythm at 93 with left axis deviation. Reviewed this EKG  ASSESSMENT AND PLAN:   HTN (hypertension) History of hypertension blood pressure measured today at 120/90. He is on Coreg 25 mg once a day as well as furosemide. He was referred to me by Dr. Lorrene Reid  for a resting tachycardia. He is really not bothered by his mildly elevated heart rate. I suspect once he starts taking his carvedilol  twice a day this will no longer be an issue.      Lorretta Harp MD FACP,FACC,FAHA, [facility] 06/19/2015 3:46 PM

## 2015-06-19 NOTE — Assessment & Plan Note (Signed)
History of hypertension blood pressure measured today at 120/90. He is on Coreg 25 mg once a day as well as furosemide. He was referred to me by Dr. Lorrene Reid  for a resting tachycardia. He is really not bothered by his mildly elevated heart rate. I suspect once he starts taking his carvedilol twice a day this will no longer be an issue.

## 2015-06-19 NOTE — Patient Instructions (Signed)
Your physician wants you to follow-up in: 3 months with Dr Berry. You will receive a reminder letter in the mail two months in advance. If you don't receive a letter, please call our office to schedule the follow-up appointment.  

## 2015-08-21 ENCOUNTER — Ambulatory Visit (HOSPITAL_COMMUNITY)
Admission: RE | Admit: 2015-08-21 | Discharge: 2015-08-21 | Disposition: A | Discharge status: Home / Self Care | Payer: 59 | Source: Ambulatory Visit | Attending: Nephrology | Admitting: Nephrology

## 2015-08-21 DIAGNOSIS — N2581 Secondary hyperparathyroidism of renal origin: Secondary | ICD-10-CM

## 2015-08-21 MED ORDER — TECHNETIUM TC 99M SESTAMIBI GENERIC - CARDIOLITE
25.0000 | Freq: Once | INTRAVENOUS | Status: AC | PRN
  Administered 2015-08-21: 25 via INTRAVENOUS

## 2015-09-24 ENCOUNTER — Ambulatory Visit: Payer: 59 | Admitting: Cardiovascular Disease

## 2015-10-10 ENCOUNTER — Telehealth: Payer: Self-pay | Admitting: Cardiovascular Disease

## 2015-10-10 NOTE — Telephone Encounter (Signed)
Received records from [facility] for appointment on 10/21/15 with Dr Gwenlyn Found.  Records given to [facility] (medical records) for Dr Kennon Holter schedule on 10/21/15. lp

## 2015-10-21 ENCOUNTER — Encounter: Payer: Self-pay | Admitting: Cardiovascular Disease

## 2015-10-21 ENCOUNTER — Ambulatory Visit (INDEPENDENT_AMBULATORY_CARE_PROVIDER_SITE_OTHER): Payer: 59 | Admitting: Cardiovascular Disease

## 2015-10-21 VITALS — BP 128/90 | HR 95 | Ht 69.0 in | Wt 348.0 lb

## 2015-10-21 DIAGNOSIS — I10 Essential (primary) hypertension: Secondary | ICD-10-CM | POA: Diagnosis not present

## 2015-10-21 DIAGNOSIS — R000 Tachycardia, unspecified: Secondary | ICD-10-CM | POA: Diagnosis not present

## 2015-10-21 NOTE — Assessment & Plan Note (Signed)
Mark [name] was initially sent to me for resting sinus tachycardia. I began him on carvedilol which she is not taking twice a day. His heart rate at home is in the 80 range. He feels chronically improved. Continue current meds at current dosing

## 2015-10-21 NOTE — Progress Notes (Signed)
10/21/2015 Mark [name]   [birth year]  ME:[id number]  Primary Physician KERR,JEFFREY, MD Primary Cardiologist: Lorretta Harp MD Mark [name]   HPI:  Mark [name] is a 54 year old morbidly overweight single African-American male with no children his retired from working at [employer]. He was referred by Dr. Lorrene Reid for cardiovascular evaluation because of mildly elevated resting heart rate. He also sees Dr. Dagmar Hait for endocrinology. I last saw him in the office 06/19/15. His cardiac risk factor profile is notable for 20-pack-years of tobacco abuse. Smoking only occasionally. He has treated hypertension. He's never had a heart attack or stroke, denies chest pain or shortness of breath. There is no family history. Otherwise he's had peptic ulcer disease in the distant past with bleeding and surgical correction. He is on carvedilol once a day. He denies palpitations or presyncope. I suspect he simply has a resting high sinus rate which has significantly improved after the addition of carvedilol.  Current Outpatient Prescriptions  Medication Sig Dispense Refill  . allopurinol (ZYLOPRIM) 300 MG tablet Take 300 mg by mouth daily.      . carvedilol (COREG) 25 MG tablet Take 25 mg by mouth 2 (two) times daily with a meal.    . furosemide (LASIX) 40 MG tablet TAKE 1 TABLET BY MOUTH TWICE A DAY IN THE MORNING AND EARLY TO MID- AFTERNOON  6  . SENSIPAR 30 MG tablet Take 30 mg by mouth daily.  5  . sodium bicarbonate 650 MG tablet Take 650 mg by mouth 3 (three) times daily.  5   No current facility-administered medications for this visit.    Allergies  Allergen Reactions  . Penicillins Swelling    Arm swells & breaks out    Social History   Social History  . Marital Status: Single    Spouse Name: N/A  . Number of Children: N/A  . Years of Education: N/A   Occupational History  . Not on file.   Social History Main Topics  . Smoking status: Current Some Day Smoker --  0.25 packs/day for 32 years    Types: Cigarettes  . Smokeless tobacco: Never Used     Comment: smoking cessation consult entered  . Alcohol Use: 3.0 oz/week    5 Cans of beer per week  . Drug Use: No  . Sexual Activity: Yes   Other Topics Concern  . Not on file   Social History Narrative     Review of Systems: General: negative for chills, fever, night sweats or weight changes.  Cardiovascular: negative for chest pain, dyspnea on exertion, edema, orthopnea, palpitations, paroxysmal nocturnal dyspnea or shortness of breath Dermatological: negative for rash Respiratory: negative for cough or wheezing Urologic: negative for hematuria Abdominal: negative for nausea, vomiting, diarrhea, bright red blood per rectum, melena, or hematemesis Neurologic: negative for visual changes, syncope, or dizziness All other systems reviewed and are otherwise negative except as noted above.    Blood pressure 128/90, pulse 95, height 5\' 9"  (1.753 m), weight 348 lb (157.852 kg).  General appearance: alert and no distress Neck: no adenopathy, no carotid bruit, no JVD, supple, symmetrical, trachea midline and thyroid not enlarged, symmetric, no tenderness/mass/nodules Lungs: clear to auscultation bilaterally Heart: regular rate and rhythm, S1, S2 normal, no murmur, click, rub or gallop Extremities: extremities normal, atraumatic, no cyanosis or edema  EKG not performed today  ASSESSMENT AND PLAN:   HTN (hypertension) Treatment of hypertension with blood pressure measurements that 120/90. He did  not take his carvedilol yet this morning. His blood pressures at home when he measures it is in the 120-130/80-94 range. He knows to avoid salt. Continue current meds at current dosing  Sinus tachycardia [facility]) Mark [name] was initially sent to me for resting sinus tachycardia. I began him on carvedilol which she is not taking twice a day. His heart rate at home is in the 80 range. He feels chronically  improved. Continue current meds at current dosing      Lorretta Harp MD [facility], [facility] 10/21/2015 8:42 AM

## 2015-10-21 NOTE — Patient Instructions (Signed)
Medication Instructions:  Your physician recommends that you continue on your current medications as directed. Please refer to the Current Medication list given to you today.   Labwork: none  Testing/Procedures: none  Follow-Up: Follow up with Dr. Berry as needed.   Any Other Special Instructions Will Be Listed Below (If Applicable).     If you need a refill on your cardiac medications before your next appointment, please call your pharmacy.   

## 2015-10-21 NOTE — Assessment & Plan Note (Signed)
Treatment of hypertension with blood pressure measurements that 120/90. He did not take his carvedilol yet this morning. His blood pressures at home when he measures it is in the 120-130/80-94 range. He knows to avoid salt. Continue current meds at current dosing

## 2016-07-28 ENCOUNTER — Other Ambulatory Visit: Payer: Self-pay | Admitting: Vascular Surgery

## 2016-07-28 DIAGNOSIS — N184 Chronic kidney disease, stage 4 (severe): Secondary | ICD-10-CM

## 2016-07-28 DIAGNOSIS — Z01810 Encounter for preprocedural cardiovascular examination: Secondary | ICD-10-CM

## 2016-08-19 ENCOUNTER — Encounter: Payer: Self-pay | Admitting: Vascular Surgery

## 2016-08-25 ENCOUNTER — Encounter: Payer: Self-pay | Admitting: Vascular Surgery

## 2016-08-25 ENCOUNTER — Ambulatory Visit (HOSPITAL_COMMUNITY)
Admission: RE | Admit: 2016-08-25 | Discharge: 2016-08-25 | Disposition: A | Discharge status: Home / Self Care | Payer: 59 | Source: Ambulatory Visit | Attending: Vascular Surgery | Admitting: Vascular Surgery

## 2016-08-25 ENCOUNTER — Ambulatory Visit (INDEPENDENT_AMBULATORY_CARE_PROVIDER_SITE_OTHER)
Admission: RE | Admit: 2016-08-25 | Discharge: 2016-08-25 | Disposition: A | Discharge status: Home / Self Care | Payer: 59 | Source: Ambulatory Visit | Attending: Vascular Surgery | Admitting: Vascular Surgery

## 2016-08-25 ENCOUNTER — Ambulatory Visit (INDEPENDENT_AMBULATORY_CARE_PROVIDER_SITE_OTHER): Payer: 59 | Admitting: Vascular Surgery

## 2016-08-25 VITALS — BP 147/96 | HR 91 | Temp 98.6°F | Resp 20 | Ht 69.0 in | Wt 343.8 lb

## 2016-08-25 DIAGNOSIS — Z01810 Encounter for preprocedural cardiovascular examination: Secondary | ICD-10-CM | POA: Diagnosis not present

## 2016-08-25 DIAGNOSIS — N184 Chronic kidney disease, stage 4 (severe): Secondary | ICD-10-CM

## 2016-08-25 NOTE — Progress Notes (Signed)
Patient name: Mark [name] MRN: [id number] DOB: 20-Mar-1961 Sex: male  REASON FOR CONSULT: Evaluate for hemodialysis access. Referred by Dr. Jamal Maes.  HPI: Mark [name] is a 55 y.o. male, who was referred for evaluation of hemodialysis access. He does note some dyspnea on exertion. He denies any nausea vomiting or palpitations.  He is right-handed. He does not have a pacemaker or AICD.  I have reviewed the records that were percent from [facility]. 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 GFR on 7/17 was 26. On 07/16/2016, GFR was 24.    Past Medical History:  Diagnosis Date  . Anemia    labs today indicate (09/27/11)  . Arthritis    right knee  . Bleeding ulcer    hx of, required 6 units blood  . Blood transfusion    6 units with ulcer repair surgery  . Chronic kidney disease   . Diabetes mellitus   . Gout   . Hypertension   . Osteoporosis     Family History  Problem Relation Age of Onset  . Cancer Mother   . Atrial fibrillation Mother   . Hypertension Mother   . Cancer Father     SOCIAL HISTORY: Social History   Social History  . Marital status: Single    Spouse name: N/A  . Number of children: N/A  . Years of education: N/A   Occupational History  . Not on file.   Social History Main Topics  . Smoking status: Current Some Day Smoker    Packs/day: 0.25    Years: 32.00    Types: Cigarettes  . Smokeless tobacco: Never Used     Comment: smoking cessation consult entered  . Alcohol use 3.0 oz/week    5 Cans of beer per week  . Drug use: No  . Sexual activity: Yes   Other Topics Concern  . Not on file   Social History Narrative  . No narrative on file    Allergies  Allergen Reactions  . Penicillins Swelling    Arm swells & breaks out    Current Outpatient Prescriptions  Medication Sig Dispense Refill  . allopurinol (ZYLOPRIM) 300 MG  tablet Take 300 mg by mouth daily.      . carvedilol (COREG) 25 MG tablet Take 25 mg by mouth. Take 1 1/2 tablets bid    . furosemide (LASIX) 40 MG tablet TAKE 1 TABLET BY MOUTH TWICE A DAY IN THE MORNING AND EARLY TO MID- AFTERNOON  6  . SENSIPAR 30 MG tablet Take 30 mg by mouth daily.  5  . sodium bicarbonate 650 MG tablet Take 650 mg by mouth 3 (three) times daily.  5   No current facility-administered medications for this visit.     REVIEW OF SYSTEMS:  [X]  denotes positive finding, [ ]  denotes negative finding Cardiac  Comments:  Chest pain or chest pressure:    Shortness of breath upon exertion: X   Short of breath when lying flat:    Irregular heart rhythm:        Vascular    Pain in calf, thigh, or hip brought on by ambulation:    Pain in feet at night that wakes you up from your sleep:     Blood clot in your veins:    Leg swelling:         Pulmonary    Oxygen at home:  Productive cough:     Wheezing:         Neurologic    Sudden weakness in arms or legs:     Sudden numbness in arms or legs:     Sudden onset of difficulty speaking or slurred speech:    Temporary loss of vision in one eye:     Problems with dizziness:         Gastrointestinal    Blood in stool:     Vomited blood:         Genitourinary    Burning when urinating:     Blood in urine:        Psychiatric    Major depression:         Hematologic    Bleeding problems:    Problems with blood clotting too easily:        Skin    Rashes or ulcers:        Constitutional    Fever or chills:      PHYSICAL EXAM: Vitals:   08/25/16 0949 08/25/16 0950  BP: (!) 148/102 (!) 147/96  Pulse: 91   Resp: 20   Temp: 98.6 F (37 C)   TempSrc: Oral   SpO2: 98%   Weight: (!) 343 lb 12.8 oz (155.9 kg)   Height: 5\' 9"  (1.753 m)     GENERAL: The patient is a well-nourished male, in no acute distress. The vital signs are documented above. CARDIAC: There is a regular rate and rhythm.  VASCULAR: I do  not detect carotid bruits. He has palpable radial pulses bilaterally. PULMONARY: There is good air exchange bilaterally without wheezing or rales. ABDOMEN: Soft and non-tender with normal pitched bowel sounds.  MUSCULOSKELETAL: There are no major deformities or cyanosis. NEUROLOGIC: No focal weakness or paresthesias are detected. SKIN: There are no ulcers or rashes noted. PSYCHIATRIC: The patient has a normal affect.  DATA:   UPPER EXTREMITY ARTERIAL DUPLEX: I have independently interpreted his upper extremity arterial duplex. There are biphasic Doppler signals in the radial and ulnar positions bilaterally.  UPPER EXTREMITY VEIN MAP: I have independently interpreted his ME vein map.  On the left side, the forearm and upper arm cephalic vein look reasonable in size. The basilic vein also looks reasonable although empties into the brachial system early.  On the right side the forearm and upper arm cephalic vein look reasonable in size. The basilic vein looks reasonable although it empties into the brachial system early.   MEDICAL ISSUES:  STAGE IV CHRONIC KIDNEY DISEASE: He appears to be a good candidate for a fistula. We have discussed the indications for the procedure and the potential complications including the risk of failure of the fistula and the mature with a need for secondary interventions. Dr. Lorrene Reid would not like [place] to place a graft if he is not a candidate for a fistula. However, it does appear that he has a reasonable vein for a fistula. The surgery has been scheduled for 09/07/2016.  HYPERTENSION: The patient's initial blood pressure today was elevated. We repeated this and this was still elevated. We have encouraged the patient to follow up with their primary care physician for management of their blood pressure.   [name] [facility] [phone]

## 2016-08-31 ENCOUNTER — Other Ambulatory Visit: Payer: Self-pay | Admitting: *Deleted

## 2016-09-01 ENCOUNTER — Other Ambulatory Visit: Payer: Self-pay | Admitting: Vascular Surgery

## 2016-09-06 ENCOUNTER — Encounter (HOSPITAL_COMMUNITY): Payer: Self-pay | Admitting: *Deleted

## 2016-09-06 MED ORDER — VANCOMYCIN HCL 10 G IV SOLR
1500.0000 mg | INTRAVENOUS | Status: DC
  Filled 2016-09-06: qty 1500

## 2016-09-06 MED ORDER — VANCOMYCIN HCL 10 G IV SOLR
1500.0000 mg | INTRAVENOUS | Status: AC
  Administered 2016-09-07: 1.5 g via INTRAVENOUS
  Filled 2016-09-06: qty 1500

## 2016-09-06 NOTE — Progress Notes (Signed)
Spoke with pt for pre-op call. Pt denies cardiac history, chest pain. States he does have sob with exertion at times. He has seen Dr. Gwenlyn Found in the past for sinus tachycardia. Pt states he's pre-diabetic, not on meds and does not check his blood sugar.

## 2016-09-07 ENCOUNTER — Telehealth: Payer: Self-pay | Admitting: Vascular Surgery

## 2016-09-07 ENCOUNTER — Ambulatory Visit (HOSPITAL_COMMUNITY): Payer: 59 | Admitting: Anesthesiology

## 2016-09-07 ENCOUNTER — Encounter (HOSPITAL_COMMUNITY): Payer: Self-pay | Admitting: *Deleted

## 2016-09-07 ENCOUNTER — Encounter (HOSPITAL_COMMUNITY)
Admission: RE | Disposition: A | Discharge status: Home / Self Care | Payer: Self-pay | Source: Ambulatory Visit | Attending: Vascular Surgery

## 2016-09-07 ENCOUNTER — Ambulatory Visit (HOSPITAL_COMMUNITY)
Admission: RE | Admit: 2016-09-07 | Discharge: 2016-09-07 | Disposition: A | Discharge status: Home / Self Care | Payer: 59 | Source: Ambulatory Visit | Attending: Vascular Surgery | Admitting: Vascular Surgery

## 2016-09-07 DIAGNOSIS — M109 Gout, unspecified: Secondary | ICD-10-CM | POA: Insufficient documentation

## 2016-09-07 DIAGNOSIS — E1122 Type 2 diabetes mellitus with diabetic chronic kidney disease: Secondary | ICD-10-CM | POA: Diagnosis not present

## 2016-09-07 DIAGNOSIS — R0602 Shortness of breath: Secondary | ICD-10-CM | POA: Insufficient documentation

## 2016-09-07 DIAGNOSIS — Z809 Family history of malignant neoplasm, unspecified: Secondary | ICD-10-CM | POA: Insufficient documentation

## 2016-09-07 DIAGNOSIS — N184 Chronic kidney disease, stage 4 (severe): Secondary | ICD-10-CM | POA: Diagnosis not present

## 2016-09-07 DIAGNOSIS — K279 Peptic ulcer, site unspecified, unspecified as acute or chronic, without hemorrhage or perforation: Secondary | ICD-10-CM | POA: Diagnosis not present

## 2016-09-07 DIAGNOSIS — Z880 Allergy status to penicillin: Secondary | ICD-10-CM | POA: Insufficient documentation

## 2016-09-07 DIAGNOSIS — F17210 Nicotine dependence, cigarettes, uncomplicated: Secondary | ICD-10-CM | POA: Insufficient documentation

## 2016-09-07 DIAGNOSIS — M1711 Unilateral primary osteoarthritis, right knee: Secondary | ICD-10-CM | POA: Diagnosis not present

## 2016-09-07 DIAGNOSIS — M810 Age-related osteoporosis without current pathological fracture: Secondary | ICD-10-CM | POA: Diagnosis not present

## 2016-09-07 DIAGNOSIS — I129 Hypertensive chronic kidney disease with stage 1 through stage 4 chronic kidney disease, or unspecified chronic kidney disease: Secondary | ICD-10-CM | POA: Diagnosis not present

## 2016-09-07 HISTORY — DX: Dyspnea, unspecified: R06.00

## 2016-09-07 HISTORY — PX: AV FISTULA PLACEMENT: SHX1204

## 2016-09-07 HISTORY — DX: Adverse effect of unspecified anesthetic, initial encounter: T41.45XA

## 2016-09-07 HISTORY — DX: Other complications of anesthesia, initial encounter: T88.59XA

## 2016-09-07 HISTORY — DX: Pneumonia, unspecified organism: J18.9

## 2016-09-07 LAB — POCT I-STAT 4, (NA,K, GLUC, HGB,HCT)
Glucose, Bld: 119 mg/dL — ABNORMAL HIGH (ref 65–99)
HCT: 38 % — ABNORMAL LOW (ref 39.0–52.0)
HEMOGLOBIN: 12.9 g/dL — AB (ref 13.0–17.0)
POTASSIUM: 3.6 mmol/L (ref 3.5–5.1)
SODIUM: 140 mmol/L (ref 135–145)

## 2016-09-07 LAB — GLUCOSE, CAPILLARY: Glucose-Capillary: 100 mg/dL — ABNORMAL HIGH (ref 65–99)

## 2016-09-07 SURGERY — ARTERIOVENOUS (AV) FISTULA CREATION
Anesthesia: Monitor Anesthesia Care | Site: Arm Lower | Laterality: Left

## 2016-09-07 MED ORDER — LIDOCAINE HCL (PF) 1 % IJ SOLN
INTRAMUSCULAR | Status: DC | PRN
  Administered 2016-09-07: 25 mL

## 2016-09-07 MED ORDER — HYDROMORPHONE HCL 1 MG/ML IJ SOLN: 0.2500 mg | INTRAMUSCULAR | Status: DC | PRN

## 2016-09-07 MED ORDER — PROPOFOL 1000 MG/100ML IV EMUL
INTRAVENOUS | Status: AC
  Filled 2016-09-07: qty 100

## 2016-09-07 MED ORDER — PROTAMINE SULFATE 10 MG/ML IV SOLN
INTRAVENOUS | Status: AC
Start: 2016-09-07 — End: 2016-09-07
  Filled 2016-09-07: qty 5

## 2016-09-07 MED ORDER — ONDANSETRON HCL 4 MG/2ML IJ SOLN: 4.0000 mg | Freq: Once | INTRAMUSCULAR | Status: DC | PRN

## 2016-09-07 MED ORDER — PROTAMINE SULFATE 10 MG/ML IV SOLN
INTRAVENOUS | Status: DC | PRN
Start: 2016-09-07 — End: 2016-09-07
  Administered 2016-09-07: 50 mg via INTRAVENOUS

## 2016-09-07 MED ORDER — HEPARIN SODIUM (PORCINE) 1000 UNIT/ML IJ SOLN
INTRAMUSCULAR | Status: DC | PRN
  Administered 2016-09-07: 10000 [IU] via INTRAVENOUS

## 2016-09-07 MED ORDER — PROPOFOL 500 MG/50ML IV EMUL
INTRAVENOUS | Status: DC | PRN
  Administered 2016-09-07: 25 ug/kg/min via INTRAVENOUS

## 2016-09-07 MED ORDER — VANCOMYCIN HCL IN DEXTROSE 1-5 GM/200ML-% IV SOLN
INTRAVENOUS | Status: AC
  Filled 2016-09-07: qty 200

## 2016-09-07 MED ORDER — EPHEDRINE 5 MG/ML INJ
INTRAVENOUS | Status: AC
  Filled 2016-09-07: qty 10

## 2016-09-07 MED ORDER — SODIUM CHLORIDE 0.9 % IV SOLN
INTRAVENOUS | Status: DC
Start: 2016-09-07 — End: 2016-09-07

## 2016-09-07 MED ORDER — PAPAVERINE HCL 30 MG/ML IJ SOLN
INTRAMUSCULAR | Status: AC
  Filled 2016-09-07: qty 2

## 2016-09-07 MED ORDER — PROPOFOL 10 MG/ML IV BOLUS
INTRAVENOUS | Status: AC
  Filled 2016-09-07: qty 20

## 2016-09-07 MED ORDER — MIDAZOLAM HCL 5 MG/5ML IJ SOLN
INTRAMUSCULAR | Status: DC | PRN
  Administered 2016-09-07 (×2): 1 mg via INTRAVENOUS

## 2016-09-07 MED ORDER — LIDOCAINE 2% (20 MG/ML) 5 ML SYRINGE
INTRAMUSCULAR | Status: AC
  Filled 2016-09-07: qty 5

## 2016-09-07 MED ORDER — ONDANSETRON HCL 4 MG/2ML IJ SOLN
INTRAMUSCULAR | Status: DC | PRN
  Administered 2016-09-07: 4 mg via INTRAVENOUS

## 2016-09-07 MED ORDER — ONDANSETRON HCL 4 MG/2ML IJ SOLN
INTRAMUSCULAR | Status: AC
  Filled 2016-09-07: qty 2

## 2016-09-07 MED ORDER — PAPAVERINE HCL 30 MG/ML IJ SOLN
INTRAMUSCULAR | Status: DC | PRN
  Administered 2016-09-07: 60 mg

## 2016-09-07 MED ORDER — CHLORHEXIDINE GLUCONATE CLOTH 2 % EX PADS: 6.0000 | MEDICATED_PAD | Freq: Once | CUTANEOUS | Status: DC

## 2016-09-07 MED ORDER — MIDAZOLAM HCL 2 MG/2ML IJ SOLN
INTRAMUSCULAR | Status: AC
  Filled 2016-09-07: qty 2

## 2016-09-07 MED ORDER — VANCOMYCIN HCL 500 MG IV SOLR
500.0000 mg | INTRAVENOUS | Status: DC
  Filled 2016-09-07: qty 500

## 2016-09-07 MED ORDER — HEPARIN SODIUM (PORCINE) 1000 UNIT/ML IJ SOLN
INTRAMUSCULAR | Status: AC
  Filled 2016-09-07: qty 1

## 2016-09-07 MED ORDER — LIDOCAINE HCL (CARDIAC) 20 MG/ML IV SOLN
INTRAVENOUS | Status: DC | PRN
  Administered 2016-09-07: 100 mg via INTRATRACHEAL

## 2016-09-07 MED ORDER — HEPARIN SODIUM (PORCINE) 5000 UNIT/ML IJ SOLN
INTRAMUSCULAR | Status: DC | PRN
  Administered 2016-09-07: 12:00:00

## 2016-09-07 MED ORDER — 0.9 % SODIUM CHLORIDE (POUR BTL) OPTIME
TOPICAL | Status: DC | PRN
  Administered 2016-09-07: 1000 mL

## 2016-09-07 MED ORDER — EPHEDRINE SULFATE 50 MG/ML IJ SOLN
INTRAMUSCULAR | Status: DC | PRN
  Administered 2016-09-07 (×4): 10 mg via INTRAVENOUS

## 2016-09-07 MED ORDER — MEPERIDINE HCL 25 MG/ML IJ SOLN: 6.2500 mg | INTRAMUSCULAR | Status: DC | PRN

## 2016-09-07 MED ORDER — OXYCODONE-ACETAMINOPHEN 5-325 MG PO TABS: 1.0000 | ORAL_TABLET | Freq: Four times a day (QID) | ORAL | 0 refills | Status: DC | PRN

## 2016-09-07 MED ORDER — SODIUM CHLORIDE 0.9 % IV SOLN
INTRAVENOUS | Status: DC
Start: 2016-09-07 — End: 2016-09-07
  Administered 2016-09-07 (×2): via INTRAVENOUS

## 2016-09-07 MED ORDER — PHENYLEPHRINE HCL 10 MG/ML IJ SOLN
INTRAMUSCULAR | Status: DC | PRN
  Administered 2016-09-07 (×7): 80 ug via INTRAVENOUS

## 2016-09-07 MED ORDER — FENTANYL CITRATE (PF) 100 MCG/2ML IJ SOLN
INTRAMUSCULAR | Status: AC
  Filled 2016-09-07: qty 4

## 2016-09-07 MED ORDER — FENTANYL CITRATE (PF) 100 MCG/2ML IJ SOLN
INTRAMUSCULAR | Status: DC | PRN
  Administered 2016-09-07 (×4): 50 ug via INTRAVENOUS

## 2016-09-07 MED ORDER — LIDOCAINE HCL (PF) 1 % IJ SOLN
INTRAMUSCULAR | Status: AC
  Filled 2016-09-07: qty 30

## 2016-09-07 SURGICAL SUPPLY — 29 items
ARMBAND PINK RESTRICT EXTREMIT (MISCELLANEOUS) ×4 IMPLANT
CANISTER SUCTION 2500CC (MISCELLANEOUS) ×2 IMPLANT
CANNULA VESSEL 3MM 2 BLNT TIP (CANNULA) ×4 IMPLANT
CLIP TI MEDIUM 6 (CLIP) ×2 IMPLANT
CLIP TI WIDE RED SMALL 6 (CLIP) ×6 IMPLANT
COVER PROBE W GEL 5X96 (DRAPES) IMPLANT
DECANTER SPIKE VIAL GLASS SM (MISCELLANEOUS) ×2 IMPLANT
DERMABOND ADVANCED (GAUZE/BANDAGES/DRESSINGS) ×1
DERMABOND ADVANCED .7 DNX12 (GAUZE/BANDAGES/DRESSINGS) ×1 IMPLANT
ELECT REM PT RETURN 9FT ADLT (ELECTROSURGICAL) ×2
ELECTRODE REM PT RTRN 9FT ADLT (ELECTROSURGICAL) ×1 IMPLANT
GLOVE BIO SURGEON STRL SZ7.5 (GLOVE) ×2 IMPLANT
GLOVE BIOGEL PI IND STRL 8 (GLOVE) ×1 IMPLANT
GLOVE BIOGEL PI INDICATOR 8 (GLOVE) ×1
GOWN STRL REUS W/ TWL LRG LVL3 (GOWN DISPOSABLE) ×3 IMPLANT
GOWN STRL REUS W/TWL LRG LVL3 (GOWN DISPOSABLE) ×3
KIT BASIN OR (CUSTOM PROCEDURE TRAY) ×2 IMPLANT
KIT ROOM TURNOVER OR (KITS) ×2 IMPLANT
NS IRRIG 1000ML POUR BTL (IV SOLUTION) ×2 IMPLANT
PACK CV ACCESS (CUSTOM PROCEDURE TRAY) ×2 IMPLANT
PAD ARMBOARD 7.5X6 YLW CONV (MISCELLANEOUS) ×4 IMPLANT
SPONGE SURGIFOAM ABS GEL 100 (HEMOSTASIS) IMPLANT
SUT PROLENE 6 0 BV (SUTURE) ×2 IMPLANT
SUT VIC AB 3-0 SH 27 (SUTURE) ×1
SUT VIC AB 3-0 SH 27X BRD (SUTURE) ×1 IMPLANT
SUT VICRYL 4-0 PS2 18IN ABS (SUTURE) ×4 IMPLANT
SYR 5ML LUER SLIP (SYRINGE) ×2 IMPLANT
UNDERPAD 30X30 (UNDERPADS AND DIAPERS) ×2 IMPLANT
WATER STERILE IRR 1000ML POUR (IV SOLUTION) ×2 IMPLANT

## 2016-09-07 NOTE — Anesthesia Preprocedure Evaluation (Signed)
Anesthesia Evaluation  Patient identified by MRN, date of birth, ID band Patient awake    Reviewed: Allergy & Precautions, NPO status , Patient's Chart, lab work & pertinent test results  Airway Mallampati: II  TM Distance: >3 FB Neck ROM: Full    Dental   Pulmonary shortness of breath, Current Smoker,    Pulmonary exam normal        Cardiovascular hypertension, Pt. on medications Normal cardiovascular exam     Neuro/Psych    GI/Hepatic PUD,   Endo/Other    Renal/GU Renal InsufficiencyRenal disease     Musculoskeletal   Abdominal   Peds  Hematology   Anesthesia Other Findings   Reproductive/Obstetrics                             Anesthesia Physical Anesthesia Plan  ASA: III  Anesthesia Plan: MAC   Post-op Pain Management:    Induction: Intravenous  Airway Management Planned: Simple Face Mask  Additional Equipment:   Intra-op Plan:   Post-operative Plan:   Informed Consent: I have reviewed the patients History and Physical, chart, labs and discussed the procedure including the risks, benefits and alternatives for the proposed anesthesia with the patient or authorized representative who has indicated his/her understanding and acceptance.     Plan Discussed with: CRNA and Surgeon  Anesthesia Plan Comments:         Anesthesia Quick Evaluation

## 2016-09-07 NOTE — Telephone Encounter (Signed)
Spoke to pt on home # for appt [place] / FU 10/13/16  Mailed letter

## 2016-09-07 NOTE — Interval H&P Note (Signed)
History and Physical Interval Note:  09/07/2016 10:41 AM  Mark [name]  has presented today for surgery, with the diagnosis of Chronic kidney disease Stage 4  The various methods of treatment have been discussed with the patient and family. After consideration of risks, benefits and other options for treatment, the patient has consented to  Procedure(s): ARTERIOVENOUS (AV) FISTULA LEFT ARM CREATION (Left) as a surgical intervention .  The patient's history has been reviewed, patient examined, no change in status, stable for surgery.  I have reviewed the patient's chart and labs.  Questions were answered to the patient's satisfaction.     [name]

## 2016-09-07 NOTE — Anesthesia Postprocedure Evaluation (Signed)
Anesthesia Post Note  Patient: Mark [name]  Procedure(s) Performed: Procedure(s) (LRB): ARTERIOVENOUS (AV) FISTULA CREATION LEFT ARM (Left)  Patient location during evaluation: PACU Anesthesia Type: MAC Level of consciousness: awake and alert Pain management: pain level controlled Vital Signs Assessment: post-procedure vital signs reviewed and stable Respiratory status: spontaneous breathing, nonlabored ventilation, respiratory function stable and patient connected to nasal cannula oxygen Cardiovascular status: stable and blood pressure returned to baseline Anesthetic complications: no    Last Vitals:  Vitals:   09/07/16 1259 09/07/16 1300  BP:  98/71  Pulse: 84 84  Resp:  (!) 21  Temp: 36.6 C 36.6 C    Last Pain:  Vitals:   09/07/16 1300  TempSrc:   PainSc: 0-No pain                 Darnell Stimson DAVID

## 2016-09-07 NOTE — Telephone Encounter (Signed)
-----   Message from Mena Goes, RN sent at 09/07/2016  1:04 PM EDT ----- Regarding: schedule 4-6 weeks w/ duplex   ----- Message ----- From: Gabriel Earing, PA-C Sent: 09/07/2016  12:23 PM To: Vvs Charge Pool  S/p left RC AVF 09/07/16. F/u with Dr. Scot Dock in 4-6 weeks with duplex.  Thanks, Aldona Bar

## 2016-09-07 NOTE — Transfer of Care (Signed)
Immediate Anesthesia Transfer of Care Note  Patient: Mark [name]  Procedure(s) Performed: Procedure(s): ARTERIOVENOUS (AV) FISTULA CREATION LEFT ARM (Left)  Patient Location: PACU  Anesthesia Type:MAC  Level of Consciousness: awake, alert , oriented and patient cooperative  Airway & Oxygen Therapy: Patient Spontanous Breathing and Patient connected to nasal cannula oxygen  Post-op Assessment: Report given to RN, Post -op Vital signs reviewed and stable and Patient moving all extremities  Post vital signs: Reviewed and stable  Last Vitals:  Vitals:   09/07/16 0837 09/07/16 1300  Pulse: 83   Resp: 18   Temp: 37.1 C (P) 36.6 C    Last Pain:  Vitals:   09/07/16 0837  TempSrc: Oral      Patients Stated Pain Goal: 3 (99/83/38 2505)  Complications: No apparent anesthesia complications

## 2016-09-07 NOTE — Progress Notes (Signed)
Pt decided he wants his belongings which only includes clothes and glasses to go to PACU. He gave his keys and wallet to his brother [name]

## 2016-09-07 NOTE — Op Note (Signed)
    NAME: Mark [name]   MRN: [id number] DOB: 06-11-1961    DATE OF OPERATION: 09/07/2016  PREOP DIAGNOSIS: Stage IV chronic kidney disease  POSTOP DIAGNOSIS: Same  PROCEDURE: Left radial cephalic AV fistula  SURGEON: Judeth Cornfield. Scot Dock, MD, FACS  ASSIST: Leontine Locket, PA  ANESTHESIA: Local with sedation   EBL: Minimal  INDICATIONS: [name] is a 55 y.o. male who is not yet on dialysis. He presents for new access.  FINDINGS: 4 mm forearm cephalic vein. Small radial artery.  TECHNIQUE: The patient was taken to the operating room and sedated by anesthesia. The left upper extremity was prepped and draped in the usual sterile fashion. The vein was fairly far lateral and therefore I elected to make a separate incision over the vein. After the skin was anesthetized with 1% lidocaine, a longitudinal incision was made over the cephalic vein. The vein was dissected free and ligated distally. It irrigated up nicely with heparinized saline. After the skin was anesthetized, a separate longitudinal incision was made over the radial artery. The radial artery was dissected free. The patient was heparinized. A tunnel was created between the 2 incisions after the skin was anesthetized. The vein was brought to the tunnel. The radial artery was clamped proximally and distally and a longitudinal arteriotomy was made. The vein was spatulated and sewn end-to-side to the artery using continuous 6-0 Prolene suture. At the completion and was a good thrill in the fistula. The heparin was partially reversed with protamine. Each of the incisions was closed with a deep 3-0 Vicryl and the skin closed with 4-0 Vicryl. Dermabond was applied. The patient tolerated the procedure well and was transferred to the recovery room in stable condition. All needle and sponge counts were correct.  [name], MD, FACS [facility]  DATE OF DICTATION:   09/07/2016

## 2016-09-07 NOTE — H&P (View-Only) (Signed)
Patient name: Mark [name] MRN: [id number] DOB: 07-Mar-1961 Sex: male  REASON FOR CONSULT: Evaluate for hemodialysis access. Referred by Dr. Jamal Maes.  HPI: Jcion Buddenhagen Emmitt is a 55 y.o. male, who was referred for evaluation of hemodialysis access. He does note some dyspnea on exertion. He denies any nausea vomiting or palpitations.  He is right-handed. He does not have a pacemaker or AICD.  I have reviewed the records that were percent from [facility]. 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 GFR on 7/17 was 26. On 07/16/2016, GFR was 24.    Past Medical History:  Diagnosis Date  . Anemia    labs today indicate (09/27/11)  . Arthritis    right knee  . Bleeding ulcer    hx of, required 6 units blood  . Blood transfusion    6 units with ulcer repair surgery  . Chronic kidney disease   . Diabetes mellitus   . Gout   . Hypertension   . Osteoporosis     Family History  Problem Relation Age of Onset  . Cancer Mother   . Atrial fibrillation Mother   . Hypertension Mother   . Cancer Father     SOCIAL HISTORY: Social History   Social History  . Marital status: Single    Spouse name: N/A  . Number of children: N/A  . Years of education: N/A   Occupational History  . Not on file.   Social History Main Topics  . Smoking status: Current Some Day Smoker    Packs/day: 0.25    Years: 32.00    Types: Cigarettes  . Smokeless tobacco: Never Used     Comment: smoking cessation consult entered  . Alcohol use 3.0 oz/week    5 Cans of beer per week  . Drug use: No  . Sexual activity: Yes   Other Topics Concern  . Not on file   Social History Narrative  . No narrative on file    Allergies  Allergen Reactions  . Penicillins Swelling    Arm swells & breaks out    Current Outpatient Prescriptions  Medication Sig Dispense Refill  . allopurinol (ZYLOPRIM) 300 MG  tablet Take 300 mg by mouth daily.      . carvedilol (COREG) 25 MG tablet Take 25 mg by mouth. Take 1 1/2 tablets bid    . furosemide (LASIX) 40 MG tablet TAKE 1 TABLET BY MOUTH TWICE A DAY IN THE MORNING AND EARLY TO MID- AFTERNOON  6  . SENSIPAR 30 MG tablet Take 30 mg by mouth daily.  5  . sodium bicarbonate 650 MG tablet Take 650 mg by mouth 3 (three) times daily.  5   No current facility-administered medications for this visit.     REVIEW OF SYSTEMS:  [X]  denotes positive finding, [ ]  denotes negative finding Cardiac  Comments:  Chest pain or chest pressure:    Shortness of breath upon exertion: X   Short of breath when lying flat:    Irregular heart rhythm:        Vascular    Pain in calf, thigh, or hip brought on by ambulation:    Pain in feet at night that wakes you up from your sleep:     Blood clot in your veins:    Leg swelling:         Pulmonary    Oxygen at home:  Productive cough:     Wheezing:         Neurologic    Sudden weakness in arms or legs:     Sudden numbness in arms or legs:     Sudden onset of difficulty speaking or slurred speech:    Temporary loss of vision in one eye:     Problems with dizziness:         Gastrointestinal    Blood in stool:     Vomited blood:         Genitourinary    Burning when urinating:     Blood in urine:        Psychiatric    Major depression:         Hematologic    Bleeding problems:    Problems with blood clotting too easily:        Skin    Rashes or ulcers:        Constitutional    Fever or chills:      PHYSICAL EXAM: Vitals:   08/25/16 0949 08/25/16 0950  BP: (!) 148/102 (!) 147/96  Pulse: 91   Resp: 20   Temp: 98.6 F (37 C)   TempSrc: Oral   SpO2: 98%   Weight: (!) 343 lb 12.8 oz (155.9 kg)   Height: 5\' 9"  (1.753 m)     GENERAL: The patient is a well-nourished male, in no acute distress. The vital signs are documented above. CARDIAC: There is a regular rate and rhythm.  VASCULAR: I do  not detect carotid bruits. He has palpable radial pulses bilaterally. PULMONARY: There is good air exchange bilaterally without wheezing or rales. ABDOMEN: Soft and non-tender with normal pitched bowel sounds.  MUSCULOSKELETAL: There are no major deformities or cyanosis. NEUROLOGIC: No focal weakness or paresthesias are detected. SKIN: There are no ulcers or rashes noted. PSYCHIATRIC: The patient has a normal affect.  DATA:   UPPER EXTREMITY ARTERIAL DUPLEX: I have independently interpreted his upper extremity arterial duplex. There are biphasic Doppler signals in the radial and ulnar positions bilaterally.  UPPER EXTREMITY VEIN MAP: I have independently interpreted his ME vein map.  On the left side, the forearm and upper arm cephalic vein look reasonable in size. The basilic vein also looks reasonable although empties into the brachial system early.  On the right side the forearm and upper arm cephalic vein look reasonable in size. The basilic vein looks reasonable although it empties into the brachial system early.   MEDICAL ISSUES:  STAGE IV CHRONIC KIDNEY DISEASE: He appears to be a good candidate for a fistula. We have discussed the indications for the procedure and the potential complications including the risk of failure of the fistula and the mature with a need for secondary interventions. Dr. Lorrene Reid would not like [place] to place a graft if he is not a candidate for a fistula. However, it does appear that he has a reasonable vein for a fistula. The surgery has been scheduled for 09/07/2016.  HYPERTENSION: The patient's initial blood pressure today was elevated. We repeated this and this was still elevated. We have encouraged the patient to follow up with their primary care physician for management of their blood pressure.   [name] [facility] [place] [phone]

## 2016-09-07 NOTE — Discharge Instructions (Signed)
° ° °  09/07/2016 Mark [name] [id number] 11-19-1960  Surgeon(s): Angelia Mould, MD  Procedure(s): Creation of left radial cephalic AV fistula  x Do not stick fistula for 12 weeks

## 2016-09-08 ENCOUNTER — Encounter (HOSPITAL_COMMUNITY): Payer: Self-pay | Admitting: Vascular Surgery

## 2016-10-06 ENCOUNTER — Encounter: Payer: Self-pay | Admitting: Vascular Surgery

## 2016-10-11 ENCOUNTER — Other Ambulatory Visit: Payer: Self-pay | Admitting: *Deleted

## 2016-10-11 DIAGNOSIS — Z992 Dependence on renal dialysis: Principal | ICD-10-CM

## 2016-10-11 DIAGNOSIS — N186 End stage renal disease: Secondary | ICD-10-CM

## 2016-10-13 ENCOUNTER — Encounter: Payer: Self-pay | Admitting: Vascular Surgery

## 2016-10-13 ENCOUNTER — Ambulatory Visit (INDEPENDENT_AMBULATORY_CARE_PROVIDER_SITE_OTHER): Payer: Self-pay | Admitting: Vascular Surgery

## 2016-10-13 ENCOUNTER — Ambulatory Visit (HOSPITAL_COMMUNITY)
Admission: RE | Admit: 2016-10-13 | Discharge: 2016-10-13 | Disposition: A | Discharge status: Home / Self Care | Payer: 59 | Source: Ambulatory Visit | Attending: Vascular Surgery | Admitting: Vascular Surgery

## 2016-10-13 VITALS — BP 136/90 | HR 92 | Temp 98.0°F | Resp 18 | Ht 69.0 in | Wt 345.0 lb

## 2016-10-13 DIAGNOSIS — N186 End stage renal disease: Secondary | ICD-10-CM | POA: Insufficient documentation

## 2016-10-13 DIAGNOSIS — Z992 Dependence on renal dialysis: Secondary | ICD-10-CM | POA: Diagnosis present

## 2016-10-13 DIAGNOSIS — N184 Chronic kidney disease, stage 4 (severe): Secondary | ICD-10-CM

## 2016-10-13 NOTE — Progress Notes (Signed)
  POST OPERATIVE OFFICE NOTE    CC:  F/u for surgery  HPI:  This is a 55 y.o. male who is s/p left radial cephalic fistula placement on 09/07/16 by Dr. Scot Dock.  He presents today for follow up.  He states he does not have any pain or numbness in his hand and has done well since surgery.    Allergies  Allergen Reactions  . Penicillins Swelling and Other (See Comments)    Arm swells & breaks out    Current Outpatient Prescriptions  Medication Sig Dispense Refill  . allopurinol (ZYLOPRIM) 300 MG tablet Take 300 mg by mouth daily.      . carvedilol (COREG) 25 MG tablet Take 37.5 mg by mouth 2 (two) times daily.     . furosemide (LASIX) 40 MG tablet TAKE 40 MG BY MOUTH TWICE A DAY IN THE MORNING AND EARLY TO MID- AFTERNOON  6  . Multiple Vitamins-Minerals (CENTRUM SILVER 50+MEN PO) Take 1 tablet by mouth every morning.     . SENSIPAR 30 MG tablet Take 30 mg by mouth daily.  5  . sodium bicarbonate 650 MG tablet Take 650 mg by mouth 2 (two) times daily.   5  . oxyCODONE-acetaminophen (ROXICET) 5-325 MG tablet Take 1 tablet by mouth every 6 (six) hours as needed for severe pain. (Patient not taking: Reported on 10/13/2016) 8 tablet 0   No current facility-administered medications for this visit.      ROS:  See HPI  Physical Exam:  Vitals:   10/13/16 1427  BP: 136/90  Pulse: 92  Resp: 18  Temp: 98 F (36.7 C)    Incision:  Healing nicely with spitting stitch distally Extremities:  + thrill/bruit within the fistula; easily palpable   Dialysis fistula duplex evaluation 10/13/16: Diameter ranges from 0.47cm-0.78cm and depth is 0.42cm-0.66cm   Assessment/Plan:  This is a 55 y.o. male who is s/p: Left radial cephalic AVF placed on 24/49/75 by Dr. Scot Dock  -the fistula is maturing nicely.  -he does not have any steal symptoms -he is not yet on dialysis.  If needed, his fistula may start being used December 08, 2016 -return to [facility] as needed   Leontine Locket, PA-C [facility] [phone]  Clinic MD:  Pt seen and examined with Dr. Scot Dock

## 2017-04-28 ENCOUNTER — Other Ambulatory Visit: Payer: Self-pay | Admitting: Nephrology

## 2017-04-28 DIAGNOSIS — S4991XA Unspecified injury of right shoulder and upper arm, initial encounter: Secondary | ICD-10-CM

## 2017-05-02 ENCOUNTER — Other Ambulatory Visit (HOSPITAL_COMMUNITY): Payer: Self-pay | Admitting: *Deleted

## 2017-05-03 ENCOUNTER — Ambulatory Visit (HOSPITAL_COMMUNITY)
Admission: RE | Admit: 2017-05-03 | Discharge: 2017-05-03 | Disposition: A | Discharge status: Home / Self Care | Payer: 59 | Source: Ambulatory Visit | Attending: Nephrology | Admitting: Nephrology

## 2017-05-03 DIAGNOSIS — D631 Anemia in chronic kidney disease: Secondary | ICD-10-CM | POA: Diagnosis present

## 2017-05-03 DIAGNOSIS — N189 Chronic kidney disease, unspecified: Secondary | ICD-10-CM | POA: Diagnosis present

## 2017-05-03 MED ORDER — SODIUM CHLORIDE 0.9 % IV SOLN
510.0000 mg | Freq: Once | INTRAVENOUS | Status: AC
  Administered 2017-05-03: 510 mg via INTRAVENOUS
  Filled 2017-05-03: qty 17

## 2017-05-03 NOTE — Discharge Instructions (Signed)

## 2017-05-12 ENCOUNTER — Other Ambulatory Visit: Payer: Self-pay | Admitting: Orthopedic Surgery

## 2017-05-13 ENCOUNTER — Ambulatory Visit
Admission: RE | Admit: 2017-05-13 | Discharge: 2017-05-13 | Disposition: A | Discharge status: Home / Self Care | Payer: 59 | Source: Ambulatory Visit | Attending: Nephrology | Admitting: Nephrology

## 2017-05-13 DIAGNOSIS — S4991XA Unspecified injury of right shoulder and upper arm, initial encounter: Secondary | ICD-10-CM

## 2017-05-18 ENCOUNTER — Encounter (HOSPITAL_COMMUNITY): Payer: Self-pay

## 2017-05-18 ENCOUNTER — Encounter (HOSPITAL_COMMUNITY)
Admission: RE | Admit: 2017-05-18 | Discharge: 2017-05-18 | Disposition: A | Discharge status: Home / Self Care | Payer: 59 | Source: Ambulatory Visit | Attending: Orthopedic Surgery | Admitting: Orthopedic Surgery

## 2017-05-18 ENCOUNTER — Other Ambulatory Visit: Payer: Self-pay | Admitting: Orthopedic Surgery

## 2017-05-18 DIAGNOSIS — M75101 Unspecified rotator cuff tear or rupture of right shoulder, not specified as traumatic: Secondary | ICD-10-CM | POA: Insufficient documentation

## 2017-05-18 DIAGNOSIS — S4291XA Fracture of right shoulder girdle, part unspecified, initial encounter for closed fracture: Secondary | ICD-10-CM | POA: Insufficient documentation

## 2017-05-18 DIAGNOSIS — Z01818 Encounter for other preprocedural examination: Secondary | ICD-10-CM | POA: Diagnosis present

## 2017-05-18 DIAGNOSIS — X58XXXA Exposure to other specified factors, initial encounter: Secondary | ICD-10-CM | POA: Diagnosis not present

## 2017-05-18 LAB — CBC
HEMATOCRIT: 36.5 % — AB (ref 39.0–52.0)
HEMOGLOBIN: 11.8 g/dL — AB (ref 13.0–17.0)
MCH: 32.8 pg (ref 26.0–34.0)
MCHC: 32.3 g/dL (ref 30.0–36.0)
MCV: 101.4 fL — ABNORMAL HIGH (ref 78.0–100.0)
Platelets: 132 10*3/uL — ABNORMAL LOW (ref 150–400)
RBC: 3.6 MIL/uL — ABNORMAL LOW (ref 4.22–5.81)
RDW: 14.7 % (ref 11.5–15.5)
WBC: 6.2 10*3/uL (ref 4.0–10.5)

## 2017-05-18 LAB — BASIC METABOLIC PANEL
ANION GAP: 7 (ref 5–15)
BUN: 43 mg/dL — ABNORMAL HIGH (ref 6–20)
CHLORIDE: 106 mmol/L (ref 101–111)
CO2: 24 mmol/L (ref 22–32)
Calcium: 11 mg/dL — ABNORMAL HIGH (ref 8.9–10.3)
Creatinine, Ser: 2.32 mg/dL — ABNORMAL HIGH (ref 0.61–1.24)
GFR calc Af Amer: 34 mL/min — ABNORMAL LOW (ref >60)
GFR, EST NON AFRICAN AMERICAN: 29 mL/min — AB (ref >60)
Glucose, Bld: 85 mg/dL (ref 65–99)
POTASSIUM: 3.7 mmol/L (ref 3.5–5.1)
Sodium: 137 mmol/L (ref 135–145)

## 2017-05-18 NOTE — Progress Notes (Signed)
PCP: Sees his nephrologist as PCP, Dr. Jamal Maes, [facility], last seen 3 weeks ago Cardiologist: Denies  EKG: 08/2016 ECHO, Stress Test, Cardiac Cath: Denies  Patient denies shortness of breath, fever, cough, and chest pain at PAT appointment.  Patient verbalized understanding of instructions provided today at the PAT appointment.  Patient asked to review instructions at home and day of surgery.   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  States after AV placement, dialysis not needed.  States dialysis was never started.

## 2017-05-18 NOTE — Progress Notes (Signed)
Called Dr. Bettina Gavia office and spoke with Angela Nevin.  Requested orders for pt's PAT appt.

## 2017-05-18 NOTE — Pre-Procedure Instructions (Signed)
Mark [name]  05/18/2017      [id number] [place], [place] - [address] [place] [address] Phone: [phone] Fax: [phone]    Your procedure is scheduled on  July 19.  Report to [facility] Admitting at 1:30 PM  Call this number if you have problems the morning of surgery:  [phone]   Call [phone] if you have any questions prior to your surgery date Monday-Friday 8am-4pm   Remember:  Do not eat food or drink liquids after midnight.  Take these medicines the morning of surgery with A SIP OF WATER  Carvedilol (Coreg), Allopurinol (Zyloprim), Oxycodone-Acetaminophen (Roxicet) pain pill if needed.  7 days prior to surgery STOP taking any Aspirin, Aleve, Naproxen, Ibuprofen, Motrin, Advil, Goody's, BC's, all herbal medications, fish oil, and all vitamins   Do not wear jewelry, make-up or nail polish.  Do not wear lotions, powders, or perfumes, or deoderant.  Do not shave 48 hours prior to surgery.  Men may shave face and neck.  Do not bring valuables to the hospital.   [facility] is not responsible for any belongings or valuables.  Contacts, dentures or bridgework may not be worn into surgery.  Leave your suitcase in the car.  After surgery it may be brought to your room.  For patients admitted to the hospital, discharge time will be determined by your treatment team.  Patients discharged the day of surgery will not be allowed to drive home.    Special instructions:   [facility]- Preparing For Surgery  Before surgery, you can play an important role. Because skin is not sterile, your skin needs to be as free of germs as possible. You can reduce the number of germs on your skin by washing with CHG (chlorahexidine gluconate) Soap before surgery.  CHG is an antiseptic cleaner which kills germs and bonds with the skin to continue killing germs even after washing.  Please do not use if  you have an allergy to CHG or antibacterial soaps. If your skin becomes reddened/irritated stop using the CHG.  Do not shave (including legs and underarms) for at least 48 hours prior to first CHG shower. It is OK to shave your face.  Please follow these instructions carefully.   1. Shower the NIGHT BEFORE SURGERY and the MORNING OF SURGERY with CHG.   2. If you chose to wash your hair, wash your hair first as usual with your normal shampoo.  3. After you shampoo, rinse your hair and body thoroughly to remove the shampoo.  4. Use CHG as you would any other liquid soap. You can apply CHG directly to the skin and wash gently with a scrungie or a clean washcloth.   5. Apply the CHG Soap to your body ONLY FROM THE NECK DOWN.  Do not use on open wounds or open sores. Avoid contact with your eyes, ears, mouth and genitals (private parts). Wash genitals (private parts) with your normal soap.  6. Wash thoroughly, paying special attention to the area where your surgery will be performed.  7. Thoroughly rinse your body with warm water from the neck down.  8. DO NOT shower/wash with your normal soap after using and rinsing off the CHG Soap.  9. Pat yourself dry with a CLEAN TOWEL.   10. Wear CLEAN PAJAMAS   11. Place CLEAN SHEETS on your bed the night of your first shower and DO  NOT SLEEP WITH PETS.    Day of Surgery: Do not apply any deodorants/lotions. Please wear clean clothes to the hospital/surgery center.      Please read over the following fact sheets that you were given. Pain Booklet, Coughing and Deep Breathing and Surgical Site Infection Prevention

## 2017-05-20 NOTE — Progress Notes (Signed)
Anesthesia Chart Review:  Pt is a 56 year old male scheduled for R shoulder arthroscopy with rotator cuff repair and subacromial decompression on 05/26/2017 with Tania Ade, M.D.  - Nephrologist is Jamal Maes, MD who is aware of upcoming surgery; last office visit 04/26/17  PMH includes: HTN, anemia CKD (stage IV, AV fistula created 09/07/16 but not yet used). Current smoker. BMI 48. S/p R THA 10/06/11.   Medications include: Carvedilol, Lasix  BP (!) 159/95   Pulse 80   Temp 36.8 C   Resp 20   Ht 5\' 9"  (1.753 m)   Wt (!) 326 lb 14.4 oz (148.3 kg)   SpO2 100%   BMI 48.27 kg/m    BP at Dr. Sanda Klein office was 100/72 on 04/26/17  Preoperative labs reviewed. CR 2.32, BUN 43.  This is consistent with prior results from nephrologist's office.   EKG 09/07/16: NSR. Incomplete RBBB.   If no changes, I anticipate pt can proceed with surgery as scheduled.   Willeen Cass, FNP-BC [facility]/Anesthesiology Phone: [phone] 05/20/2017 2:57 PM

## 2017-05-25 MED ORDER — VANCOMYCIN HCL 10 G IV SOLR
1500.0000 mg | INTRAVENOUS | Status: AC
  Administered 2017-05-26: 1500 mg via INTRAVENOUS
  Filled 2017-05-25: qty 1500

## 2017-05-26 ENCOUNTER — Ambulatory Visit (HOSPITAL_COMMUNITY): Payer: 59 | Admitting: Emergency Medicine

## 2017-05-26 ENCOUNTER — Ambulatory Visit (HOSPITAL_COMMUNITY): Payer: 59 | Admitting: Certified Registered Nurse Anesthetist

## 2017-05-26 ENCOUNTER — Encounter (HOSPITAL_COMMUNITY)
Admission: RE | Disposition: A | Discharge status: Home / Self Care | Payer: Self-pay | Source: Ambulatory Visit | Attending: Orthopedic Surgery

## 2017-05-26 ENCOUNTER — Ambulatory Visit (HOSPITAL_COMMUNITY)
Admission: RE | Admit: 2017-05-26 | Discharge: 2017-05-26 | Disposition: A | Discharge status: Home / Self Care | Payer: 59 | Source: Ambulatory Visit | Attending: Orthopedic Surgery | Admitting: Orthopedic Surgery

## 2017-05-26 ENCOUNTER — Encounter (HOSPITAL_COMMUNITY): Payer: Self-pay

## 2017-05-26 DIAGNOSIS — M109 Gout, unspecified: Secondary | ICD-10-CM | POA: Diagnosis not present

## 2017-05-26 DIAGNOSIS — Z6841 Body Mass Index (BMI) 40.0 and over, adult: Secondary | ICD-10-CM | POA: Diagnosis not present

## 2017-05-26 DIAGNOSIS — W010XXA Fall on same level from slipping, tripping and stumbling without subsequent striking against object, initial encounter: Secondary | ICD-10-CM | POA: Diagnosis not present

## 2017-05-26 DIAGNOSIS — S43431A Superior glenoid labrum lesion of right shoulder, initial encounter: Secondary | ICD-10-CM | POA: Diagnosis not present

## 2017-05-26 DIAGNOSIS — E1122 Type 2 diabetes mellitus with diabetic chronic kidney disease: Secondary | ICD-10-CM | POA: Diagnosis not present

## 2017-05-26 DIAGNOSIS — I129 Hypertensive chronic kidney disease with stage 1 through stage 4 chronic kidney disease, or unspecified chronic kidney disease: Secondary | ICD-10-CM | POA: Insufficient documentation

## 2017-05-26 DIAGNOSIS — M1711 Unilateral primary osteoarthritis, right knee: Secondary | ICD-10-CM | POA: Insufficient documentation

## 2017-05-26 DIAGNOSIS — S46011A Strain of muscle(s) and tendon(s) of the rotator cuff of right shoulder, initial encounter: Secondary | ICD-10-CM | POA: Insufficient documentation

## 2017-05-26 DIAGNOSIS — N184 Chronic kidney disease, stage 4 (severe): Secondary | ICD-10-CM | POA: Insufficient documentation

## 2017-05-26 DIAGNOSIS — J449 Chronic obstructive pulmonary disease, unspecified: Secondary | ICD-10-CM | POA: Insufficient documentation

## 2017-05-26 DIAGNOSIS — Z8711 Personal history of peptic ulcer disease: Secondary | ICD-10-CM | POA: Insufficient documentation

## 2017-05-26 DIAGNOSIS — M810 Age-related osteoporosis without current pathological fracture: Secondary | ICD-10-CM | POA: Insufficient documentation

## 2017-05-26 DIAGNOSIS — F17210 Nicotine dependence, cigarettes, uncomplicated: Secondary | ICD-10-CM | POA: Insufficient documentation

## 2017-05-26 DIAGNOSIS — Z880 Allergy status to penicillin: Secondary | ICD-10-CM | POA: Insufficient documentation

## 2017-05-26 DIAGNOSIS — Z79899 Other long term (current) drug therapy: Secondary | ICD-10-CM | POA: Diagnosis not present

## 2017-05-26 HISTORY — PX: SHOULDER ARTHROSCOPY WITH ROTATOR CUFF REPAIR AND SUBACROMIAL DECOMPRESSION: SHX5686

## 2017-05-26 LAB — POCT I-STAT 4, (NA,K, GLUC, HGB,HCT)
GLUCOSE: 88 mg/dL (ref 65–99)
HEMATOCRIT: 36 % — AB (ref 39.0–52.0)
Hemoglobin: 12.2 g/dL — ABNORMAL LOW (ref 13.0–17.0)
POTASSIUM: 3.9 mmol/L (ref 3.5–5.1)
Sodium: 140 mmol/L (ref 135–145)

## 2017-05-26 SURGERY — SHOULDER ARTHROSCOPY WITH ROTATOR CUFF REPAIR AND SUBACROMIAL DECOMPRESSION
Anesthesia: General | Laterality: Right

## 2017-05-26 MED ORDER — METHOCARBAMOL 500 MG PO TABS: 500.0000 mg | ORAL_TABLET | Freq: Three times a day (TID) | ORAL | 0 refills | Status: DC | PRN

## 2017-05-26 MED ORDER — DEXAMETHASONE SODIUM PHOSPHATE 10 MG/ML IJ SOLN
INTRAMUSCULAR | Status: DC | PRN
  Administered 2017-05-26: 10 mg via INTRAVENOUS

## 2017-05-26 MED ORDER — PROPOFOL 10 MG/ML IV BOLUS
INTRAVENOUS | Status: DC | PRN
  Administered 2017-05-26: 200 mg via INTRAVENOUS

## 2017-05-26 MED ORDER — ONDANSETRON HCL 4 MG/2ML IJ SOLN
INTRAMUSCULAR | Status: DC | PRN
  Administered 2017-05-26: 4 mg via INTRAVENOUS

## 2017-05-26 MED ORDER — STERILE WATER FOR IRRIGATION IR SOLN
Status: DC | PRN
  Administered 2017-05-26: 1000 mL

## 2017-05-26 MED ORDER — DEXAMETHASONE SODIUM PHOSPHATE 10 MG/ML IJ SOLN
INTRAMUSCULAR | Status: AC
  Filled 2017-05-26: qty 1

## 2017-05-26 MED ORDER — SUGAMMADEX SODIUM 200 MG/2ML IV SOLN
INTRAVENOUS | Status: AC
  Filled 2017-05-26: qty 4

## 2017-05-26 MED ORDER — FENTANYL CITRATE (PF) 100 MCG/2ML IJ SOLN
INTRAMUSCULAR | Status: AC
  Administered 2017-05-26: 100 ug
  Filled 2017-05-26: qty 2

## 2017-05-26 MED ORDER — OXYCODONE-ACETAMINOPHEN 5-325 MG PO TABS: 1.0000 | ORAL_TABLET | ORAL | 0 refills | Status: DC | PRN

## 2017-05-26 MED ORDER — PHENYLEPHRINE 40 MCG/ML (10ML) SYRINGE FOR IV PUSH (FOR BLOOD PRESSURE SUPPORT)
PREFILLED_SYRINGE | INTRAVENOUS | Status: DC | PRN
  Administered 2017-05-26: 200 ug via INTRAVENOUS
  Administered 2017-05-26: 120 ug via INTRAVENOUS
  Administered 2017-05-26: 200 ug via INTRAVENOUS
  Administered 2017-05-26: 80 ug via INTRAVENOUS
  Administered 2017-05-26: 120 ug via INTRAVENOUS

## 2017-05-26 MED ORDER — PROPOFOL 10 MG/ML IV BOLUS
INTRAVENOUS | Status: AC
  Filled 2017-05-26: qty 20

## 2017-05-26 MED ORDER — SODIUM CHLORIDE 0.9 % IR SOLN
Status: DC | PRN
  Administered 2017-05-26 (×2): 3000 mL

## 2017-05-26 MED ORDER — SUCCINYLCHOLINE CHLORIDE 200 MG/10ML IV SOSY
PREFILLED_SYRINGE | INTRAVENOUS | Status: DC | PRN
  Administered 2017-05-26: 120 mg via INTRAVENOUS

## 2017-05-26 MED ORDER — HYDROMORPHONE HCL 1 MG/ML IJ SOLN: 0.2500 mg | INTRAMUSCULAR | Status: DC | PRN

## 2017-05-26 MED ORDER — EPHEDRINE SULFATE-NACL 50-0.9 MG/10ML-% IV SOSY
PREFILLED_SYRINGE | INTRAVENOUS | Status: DC | PRN
Start: 2017-05-26 — End: 2017-05-26
  Administered 2017-05-26: 10 mg via INTRAVENOUS

## 2017-05-26 MED ORDER — PROMETHAZINE HCL 25 MG/ML IJ SOLN: 6.2500 mg | INTRAMUSCULAR | Status: DC | PRN

## 2017-05-26 MED ORDER — PHENYLEPHRINE HCL 10 MG/ML IJ SOLN
INTRAVENOUS | Status: DC | PRN
  Administered 2017-05-26: 50 ug/min via INTRAVENOUS

## 2017-05-26 MED ORDER — FENTANYL CITRATE (PF) 100 MCG/2ML IJ SOLN
INTRAMUSCULAR | Status: DC | PRN
  Administered 2017-05-26: 50 ug via INTRAVENOUS
  Administered 2017-05-26: 100 ug via INTRAVENOUS

## 2017-05-26 MED ORDER — POVIDONE-IODINE 7.5 % EX SOLN: Freq: Once | CUTANEOUS | Status: DC

## 2017-05-26 MED ORDER — DOCUSATE SODIUM 100 MG PO CAPS: 100.0000 mg | ORAL_CAPSULE | Freq: Three times a day (TID) | ORAL | 0 refills | Status: DC | PRN

## 2017-05-26 MED ORDER — FENTANYL CITRATE (PF) 250 MCG/5ML IJ SOLN
INTRAMUSCULAR | Status: AC
  Filled 2017-05-26: qty 5

## 2017-05-26 MED ORDER — BUPIVACAINE-EPINEPHRINE (PF) 0.5% -1:200000 IJ SOLN
INTRAMUSCULAR | Status: DC | PRN
  Administered 2017-05-26: 30 mL via PERINEURAL

## 2017-05-26 MED ORDER — ROCURONIUM BROMIDE 50 MG/5ML IV SOLN
INTRAVENOUS | Status: AC
  Filled 2017-05-26: qty 1

## 2017-05-26 MED ORDER — MIDAZOLAM HCL 2 MG/2ML IJ SOLN
INTRAMUSCULAR | Status: AC
  Administered 2017-05-26: 2 mg
  Filled 2017-05-26: qty 2

## 2017-05-26 MED ORDER — SODIUM CHLORIDE 0.9 % IV SOLN
INTRAVENOUS | Status: DC
  Administered 2017-05-26: 14:00:00 via INTRAVENOUS

## 2017-05-26 MED ORDER — LACTATED RINGERS IV SOLN
INTRAVENOUS | Status: DC | PRN
  Administered 2017-05-26: 15:00:00 via INTRAVENOUS

## 2017-05-26 MED ORDER — MEPERIDINE HCL 25 MG/ML IJ SOLN: 6.2500 mg | INTRAMUSCULAR | Status: DC | PRN

## 2017-05-26 MED ORDER — MIDAZOLAM HCL 2 MG/2ML IJ SOLN: 0.5000 mg | Freq: Once | INTRAMUSCULAR | Status: DC | PRN

## 2017-05-26 MED ORDER — MIDAZOLAM HCL 2 MG/2ML IJ SOLN
INTRAMUSCULAR | Status: AC
Start: 2017-05-26 — End: 2017-05-26
  Filled 2017-05-26: qty 2

## 2017-05-26 MED ORDER — 0.9 % SODIUM CHLORIDE (POUR BTL) OPTIME
TOPICAL | Status: DC | PRN
  Administered 2017-05-26: 1000 mL

## 2017-05-26 MED ORDER — ONDANSETRON HCL 4 MG/2ML IJ SOLN
INTRAMUSCULAR | Status: AC
Start: 2017-05-26 — End: 2017-05-26
  Filled 2017-05-26: qty 2

## 2017-05-26 MED ORDER — PHENYLEPHRINE 40 MCG/ML (10ML) SYRINGE FOR IV PUSH (FOR BLOOD PRESSURE SUPPORT)
PREFILLED_SYRINGE | INTRAVENOUS | Status: AC
Start: 2017-05-26 — End: 2017-05-26
  Filled 2017-05-26: qty 10

## 2017-05-26 SURGICAL SUPPLY — 57 items
ANCHOR PEEK 4.75X19.1 SWLK C (Anchor) ×4 IMPLANT
BLADE SURG 11 STRL SS (BLADE) ×2 IMPLANT
BUR OVAL 4.0 (BURR) ×2 IMPLANT
CANNULA 5.75X71 LONG (CANNULA) ×2 IMPLANT
CANNULA TWIST IN 8.25X7CM (CANNULA) IMPLANT
CHLORAPREP W/TINT 26ML (MISCELLANEOUS) ×2 IMPLANT
DRAPE INCISE IOBAN 66X45 STRL (DRAPES) ×2 IMPLANT
DRAPE ORTHO SPLIT 77X108 STRL (DRAPES) ×2
DRAPE STERI 35X30 U-POUCH (DRAPES) ×2 IMPLANT
DRAPE SURG 17X23 STRL (DRAPES) ×2 IMPLANT
DRAPE SURG ORHT 6 SPLT 77X108 (DRAPES) ×2 IMPLANT
DRAPE U-SHAPE 47X51 STRL (DRAPES) ×2 IMPLANT
DRAPE U-SHAPE 76X120 STRL (DRAPES) ×2 IMPLANT
DRSG PAD ABDOMINAL 8X10 ST (GAUZE/BANDAGES/DRESSINGS) ×4 IMPLANT
GAUZE SPONGE 4X4 12PLY STRL (GAUZE/BANDAGES/DRESSINGS) ×2 IMPLANT
GAUZE XEROFORM 1X8 LF (GAUZE/BANDAGES/DRESSINGS) ×2 IMPLANT
GLOVE BIO SURGEON STRL SZ7 (GLOVE) ×4 IMPLANT
GLOVE BIO SURGEON STRL SZ7.5 (GLOVE) ×2 IMPLANT
GLOVE BIOGEL PI IND STRL 7.0 (GLOVE) ×2 IMPLANT
GLOVE BIOGEL PI IND STRL 8 (GLOVE) ×1 IMPLANT
GLOVE BIOGEL PI INDICATOR 7.0 (GLOVE) ×2
GLOVE BIOGEL PI INDICATOR 8 (GLOVE) ×1
GOWN STRL REUS W/ TWL LRG LVL3 (GOWN DISPOSABLE) ×3 IMPLANT
GOWN STRL REUS W/ TWL XL LVL3 (GOWN DISPOSABLE) ×1 IMPLANT
GOWN STRL REUS W/TWL LRG LVL3 (GOWN DISPOSABLE) ×3
GOWN STRL REUS W/TWL XL LVL3 (GOWN DISPOSABLE) ×1
KIT BASIN OR (CUSTOM PROCEDURE TRAY) ×2 IMPLANT
KIT ROOM TURNOVER OR (KITS) ×2 IMPLANT
LASSO 90 CVE QUICKPAS (DISPOSABLE) ×2 IMPLANT
MANIFOLD NEPTUNE II (INSTRUMENTS) ×2 IMPLANT
NDL SUT 6 .5 CRC .975X.05 MAYO (NEEDLE) IMPLANT
NEEDLE HYPO 25GX1X1/2 BEV (NEEDLE) IMPLANT
NEEDLE MAYO TAPER (NEEDLE)
NEEDLE SCORPION MULTI FIRE (NEEDLE) IMPLANT
NEEDLE SPNL 18GX3.5 QUINCKE PK (NEEDLE) ×2 IMPLANT
PACK SHOULDER (CUSTOM PROCEDURE TRAY) ×2 IMPLANT
PAD ARMBOARD 7.5X6 YLW CONV (MISCELLANEOUS) ×4 IMPLANT
RESECTOR FULL RADIUS 4.2MM (BLADE) ×2 IMPLANT
SLING ARM FOAM STRAP LRG (SOFTGOODS) ×2 IMPLANT
SLING ARM FOAM STRAP MED (SOFTGOODS) IMPLANT
SPONGE LAP 4X18 X RAY DECT (DISPOSABLE) IMPLANT
SUPPORT WRAP ARM LG (MISCELLANEOUS) ×2 IMPLANT
SUT 2 FIBERLOOP 20 STRT BLUE (SUTURE)
SUT ETHILON 3 0 PS 1 (SUTURE) ×2 IMPLANT
SUT TIGER TAPE 7 IN WHITE (SUTURE) IMPLANT
SUTURE 2 FIBERLOOP 20 STRT BLU (SUTURE) IMPLANT
SUTURE TAPE 1.3 40 TPR END (SUTURE) IMPLANT
SUTURETAPE 1.3 40 TPR END (SUTURE)
SYR CONTROL 10ML LL (SYRINGE) ×2 IMPLANT
TAPE FIBER 2MM 7IN #2 BLUE (SUTURE) IMPLANT
TOWEL OR 17X24 6PK STRL BLUE (TOWEL DISPOSABLE) ×2 IMPLANT
TOWEL OR 17X26 10 PK STRL BLUE (TOWEL DISPOSABLE) ×2 IMPLANT
TOWEL OR NON WOVEN STRL DISP B (DISPOSABLE) ×2 IMPLANT
TUBE CONNECTING 12X1/4 (SUCTIONS) ×2 IMPLANT
TUBING ARTHROSCOPY IRRIG 16FT (MISCELLANEOUS) ×2 IMPLANT
WAND HAND CNTRL MULTIVAC 90 (MISCELLANEOUS) ×2 IMPLANT
WATER STERILE IRR 1000ML POUR (IV SOLUTION) ×2 IMPLANT

## 2017-05-26 NOTE — Op Note (Signed)
Procedure(s):  Mark [name] male 56 y.o. 05/26/2017  Procedure(s) and Anesthesia Type: #1 right shoulder arthroscopic rotator cuff repair including bony fragment of the weighted tuberosity  #2 right shoulder arthroscopic subacromial decompression #3 right shoulder arthroscopic extensive debridement labral tear and biceps tenotomy      Surgeon(s) and Role:    Tania Ade, MD - Primary     Surgeon: Nita Sells   Assistants: Jeanmarie Hubert PA-C [facility] was present and scrubbed throughout the procedure and was essential in positioning, retraction, exposure, and closure)  Anesthesia: General endotracheal anesthesia with preoperative interscalene block given by the attending anesthesiologist     Procedure Detail    Estimated Blood Loss: Min         Drains: none  Blood Given: none         Specimens: none        Complications:  * No complications entered in OR log *         Disposition: PACU - hemodynamically stable.         Condition: stable    Procedure:   INDICATIONS FOR SURGERY: The patient is 56 y.o. male who had a fall approximately 6 weeks ago with resultant injury to the right shoulder including comminuted greater tuberosity fracture with a displaced fragment which involves supraspinatus insertion. Indicated for surgical treatment to try and promote anatomic healing and restore function.    OPERATIVE FINDINGS: Examination under anesthesia: No stiffness or instability.    DESCRIPTION OF PROCEDURE: The patient was identified in preoperative  holding area where I personally marked the operative site after  verifying site, side, and procedure with the patient. An interscalene block was given by the attending anesthesiologist the holding area.  The patient was taken back to the operating room where general anesthesia was induced without complication and was placed in the beach-chair position with the back  elevated about 60 degrees and  all extremities and head and neck carefully padded and  positioned.   The right upper extremity was then prepped and  draped in a standard sterile fashion. The appropriate time-out  procedure was carried out. The patient did receive IV antibiotics  within 30 minutes of incision.   A small posterior portal incision was made and the arthroscope was introduced into the joint. An anterior portal was then established above the subscapularis using needle localization. Small cannula was placed anteriorly. Diagnostic arthroscopy was then carried out.  He was noted to have extensive tearing of the labrum involving the superior labrum including the biceps origin, the anterior labrum and the posterior labrum. This was extensively debrided with a shaver back to more healthy labrum. The biceps tendon was pulled into the joint noted to have extensive tearing involving more than 50% of the thickness the tendon. Given this in conjunction with the associated SLAP tear felt the biceps tenotomy would appropriately alleviate the patient's pain associated with this. Therefore the ArthroCare was used to release the biceps from the superior labrum and was allowed to retract from the shoulder. The humeral joint surfaces were intact. Superiorly he was noted to have a retracted piece of greater tuberosity above the humeral head.  The arthroscope was then introduced into the subacromial space a standard lateral portal was established with needle localization. The shaver was used through the lateral portal to perform extensive bursectomy.  The bursal side of the injury was carefully probed and healing tissue was debrided to identify the bony bed of the greater tuberosity and the  retracted bony fragment attached to the supraspinatus tendon. Once adequately mobilize it was clear that this could be brought back to its bony bed without difficulty. There were several other small fragments in the bony bed which were partially healed in  place. A 4.75 peek swivel lock anchor was placed just off the articular margin and the tuberosity. This was preloaded with a fiber tape and a tighter tape. These were suture strands were then passed evenly on the medial aspect of the bone tendon junction of the greater tuberosity fragment using a 90 suture lasso to pass the sutures. These were then brought over to a point on the lateral proximal humerus below the lowest fracture fragment and into an additional 4.75 swivel lock anchor which secured the bony fragment back to the bony bed where it came from and repair the supraspinatus tendon.  The coracoacromial ligament was taken down off the anterior acromion with the ArthroCare exposing a large hooked  anterior acromial spur. A high-speed bur was then used through the lateral portal to take down the anterior acromial spur from lateral to medial in a standard acromioplasty.  The acromioplasty was also viewed from the lateral portal and the bur was used as necessary to ensure that the acromion was completely flat from posterior to anterior.  The arthroscopic equipment was removed from the joint and the portals were closed with 3-0 nylon in an interrupted fashion. Sterile dressings were then applied including Xeroform 4 x 4's ABDs and tape. The patient was then allowed to awaken from general anesthesia, placed in a sling, transferred to the stretcher and taken to the recovery room in stable condition.   POSTOPERATIVE PLAN: The patient will be discharged home today and will followup in one week for suture removal and wound check.  He should have x-rays at that time. We will keep in a sling for about 6 weeks to allow bony healing before any significant movement of the shoulder.

## 2017-05-26 NOTE — Anesthesia Procedure Notes (Signed)
Anesthesia Regional Block: Interscalene brachial plexus block   Pre-Anesthetic Checklist: ,, timeout performed, Correct Patient, Correct Site, Correct Laterality, Correct Procedure, Correct Position, site marked, Risks and benefits discussed,  Surgical consent,  Pre-op evaluation,  At surgeon's request and post-op pain management  Laterality: Right and Upper  Prep: chloraprep       Needles:  Injection technique: Single-shot  Needle Type: Stimulator Needle - 40     Needle Length: 4cm  Needle Gauge: 21     Additional Needles:   Procedures:, nerve stimulator,,,,,,,   Nerve Stimulator or Paresthesia:  Response: forearm twitch, 0.45 mA, 0.1 ms,   Additional Responses:   Narrative:  Start time: 05/26/2017 2:51 PM End time: 05/26/2017 2:58 PM Injection made incrementally with aspirations every 5 mL.  Performed by: Personally  Anesthesiologist: Glennon Mac, Camaria Gerald  Additional Notes: Pt identified in Holding room.  Monitors applied. Working IV access confirmed. Sterile prep R neck.  #21ga PNS to forearm twitch at 0.55mA threshold.  30cc 0.5% Bupivacaine with 1:200k epi injected incrementally after negative test dose.  Patient asymptomatic, VSS, no heme aspirated, tolerated well.  Jenita Seashore, MD

## 2017-05-26 NOTE — Anesthesia Preprocedure Evaluation (Addendum)
Anesthesia Evaluation  Patient identified by MRN, date of birth, ID band Patient awake    Reviewed: Allergy & Precautions, NPO status , Patient's Chart, lab work & pertinent test results  History of Anesthesia Complications Negative for: history of anesthetic complications  Airway Mallampati: III  TM Distance: >3 FB Neck ROM: Full    Dental  (+) Dental Advisory Given   Pulmonary COPD, Current Smoker,    breath sounds clear to auscultation       Cardiovascular hypertension, Pt. on medications (-) angina Rhythm:Regular Rate:Normal     Neuro/Psych negative neurological ROS     GI/Hepatic negative GI ROS, Neg liver ROS, PUD,   Endo/Other  diabetes (diet controlled)Morbid obesity  Renal/GU Renal InsufficiencyRenal disease (creat 2.32, no dialysis yet)     Musculoskeletal   Abdominal (+) + obese,   Peds  Hematology   Anesthesia Other Findings   Reproductive/Obstetrics                            Anesthesia Physical Anesthesia Plan  ASA: III  Anesthesia Plan: General   Post-op Pain Management: GA combined w/ Regional for post-op pain   Induction: Intravenous  PONV Risk Score and Plan: 1 and Ondansetron and Dexamethasone  Airway Management Planned: Oral ETT and Video Laryngoscope Planned  Additional Equipment:   Intra-op Plan:   Post-operative Plan: Extubation in OR  Informed Consent: I have reviewed the patients History and Physical, chart, labs and discussed the procedure including the risks, benefits and alternatives for the proposed anesthesia with the patient or authorized representative who has indicated his/her understanding and acceptance.   Dental advisory given  Plan Discussed with: CRNA and Surgeon  Anesthesia Plan Comments: (Plan routine monitors, GETA with VideoGlide intubation and interscalene block for post op analgesia)        Anesthesia Quick Evaluation

## 2017-05-26 NOTE — Anesthesia Postprocedure Evaluation (Signed)
Anesthesia Post Note  Patient: Dontrel Smethers Carlisi  Procedure(s) Performed: Procedure(s) (LRB): SHOULDER ARTHROSCOPY WITH ROTATOR CUFF REPAIR AND SUBACROMIAL DECOMPRESSION (Right)     Patient location during evaluation: PACU Anesthesia Type: General Level of consciousness: awake and alert Pain management: pain level controlled Vital Signs Assessment: post-procedure vital signs reviewed and stable Respiratory status: spontaneous breathing, nonlabored ventilation, respiratory function stable and patient connected to nasal cannula oxygen Cardiovascular status: blood pressure returned to baseline and stable Postop Assessment: no signs of nausea or vomiting Anesthetic complications: no    Last Vitals:  Vitals:   05/26/17 1942 05/26/17 1957  BP: (!) 153/84 (!) 158/86  Pulse: 80 80  Resp: 20 18  Temp: 36.7 C 36.7 C    Last Pain:  Vitals:   05/26/17 1342  TempSrc: Oral                 Jim Philemon DAVID

## 2017-05-26 NOTE — H&P (Signed)
Mark [name] is an 56 y.o. male.   Chief Complaint: Right shoulder injury HPI: Status post fall 6 weeks ago with comminuted greater tuberosity fracture the right shoulder with displacement above the humeral head.  Past Medical History:  Diagnosis Date  . Anemia    labs today indicate (09/27/11)  . Arthritis    right knee  . Bleeding ulcer    hx of, required 6 units blood  . Blood transfusion    6 units with ulcer repair surgery  . Chronic kidney disease    stage IV, has fistula, never started diaylsis  . Complication of anesthesia    little slow to wake up with last surgery  . Dyspnea    with exertion  . Gout   . Hypertension   . Osteoporosis   . Pneumonia     Past Surgical History:  Procedure Laterality Date  . AV FISTULA PLACEMENT Left 09/07/2016   Procedure: ARTERIOVENOUS (AV) FISTULA CREATION LEFT ARM;  Surgeon: Angelia Mould, MD;  Location: [facility];  Service: Vascular;  Laterality: Left;  . COLONOSCOPY    . EXTERNAL EAR SURGERY    . HIP PINNING  11/2010  . JOINT REPLACEMENT    . OTHER SURGICAL HISTORY  ~ 08/1998   repair bleeding ulcer  . TOTAL HIP ARTHROPLASTY  10/06/11   left  . TOTAL HIP ARTHROPLASTY  10/06/2011   Procedure: TOTAL HIP ARTHROPLASTY;  Surgeon: Kerin Salen;  Location: [facility];  Service: Orthopedics;  Laterality: Left;    Family History  Problem Relation Age of Onset  . Cancer Mother   . Atrial fibrillation Mother   . Hypertension Mother   . Cancer Father    Social History:  reports that he has been smoking Cigarettes.  He has a 8.00 pack-year smoking history. He quit smokeless tobacco use about 43 years ago. His smokeless tobacco use included Chew. He reports that he drinks about 3.0 oz of alcohol per week . He reports that he does not use drugs.  Allergies:  Allergies  Allergen Reactions  . Penicillins Swelling and Other (See Comments)    Arm swells & breaks out  Eyes swelled Has patient had a PCN reaction causing immediate rash,  facial/tongue/throat swelling, SOB or lightheadedness with hypotension: Yes Has patient had a PCN reaction causing severe rash involving mucus membranes or skin necrosis: Yes Has patient had a PCN reaction that required hospitalization: No Has patient had a PCN reaction occurring within the last 10 years: No If all of the above answers are "NO", then may proceed with Cephalosporin use.     Medications Prior to Admission  Medication Sig Dispense Refill  . allopurinol (ZYLOPRIM) 300 MG tablet Take 300 mg by mouth daily.      . carvedilol (COREG) 25 MG tablet Take 37.5 mg by mouth 2 (two) times daily. Takes 1.5 tablet    . furosemide (LASIX) 40 MG tablet TAKE 40 MG BY MOUTH TWICE A DAY IN THE MORNING AND EARLY TO MID- AFTERNOON  6  . Multiple Vitamins-Minerals (CENTRUM SILVER 50+MEN PO) Take 1 tablet by mouth daily.     . SENSIPAR 30 MG tablet Take 30 mg by mouth daily.  5  . sodium bicarbonate 650 MG tablet Take 650 mg by mouth 2 (two) times daily.   5  . oxyCODONE-acetaminophen (ROXICET) 5-325 MG tablet Take 1 tablet by mouth every 6 (six) hours as needed for severe pain. (Patient not taking: Reported on 10/13/2016) 8 tablet 0  .  tetrahydrozoline 0.05 % ophthalmic solution Place 1 drop into both eyes daily as needed (allergies and redness).      Results for orders placed or performed during the hospital encounter of 05/26/17 (from the past 48 hour(s))  I-STAT 4, (NA,K, GLUC, HGB,HCT)     Status: Abnormal   Collection Time: 05/26/17  1:51 PM  Result Value Ref Range   Sodium 140 135 - 145 mmol/L   Potassium 3.9 3.5 - 5.1 mmol/L   Glucose, Bld 88 65 - 99 mg/dL   HCT 36.0 (L) 39.0 - 52.0 %   Hemoglobin 12.2 (L) 13.0 - 17.0 g/dL   No results found.  Review of Systems  All other systems reviewed and are negative.   Blood pressure 108/65, pulse 84, temperature 98.1 F (36.7 C), temperature source Oral, resp. rate 18, height 5\' 9"  (1.753 m), weight (!) 145.2 kg (320 lb), SpO2 99  %. Physical Exam  Constitutional: He is oriented to person, place, and time. He appears well-developed and well-nourished.  HENT:  Head: Atraumatic.  Eyes: EOM are normal.  Respiratory: Effort normal.  Musculoskeletal:  Examination of the right shoulder shows mild swelling. Has tenderness over the greater tuberosity and weakness with cuff testing.  Neurological: He is alert and oriented to person, place, and time.  Skin: Skin is warm and dry.  Psychiatric: He has a normal mood and affect.     Assessment/Plan Status post fall 6 weeks ago with comminuted greater tuberosity fracture the right shoulder with displacement above the humeral head. Plan arthroscopic repair of the supraspinatus including the bony fragment the greater tuberosity Risks / benefits of surgery discussed Consent on chart  NPO for OR Preop antibiotics   Nita Sells, MD 05/26/2017, 5:20 PM

## 2017-05-26 NOTE — Anesthesia Procedure Notes (Signed)
Procedure Name: Intubation Date/Time: 05/26/2017 3:39 PM Performed by: Trixie Deis A Pre-anesthesia Checklist: Patient identified, Emergency Drugs available, Suction available and Patient being monitored Patient Re-evaluated:Patient Re-evaluated prior to induction Oxygen Delivery Method: Circle System Utilized Preoxygenation: Pre-oxygenation with 100% oxygen Induction Type: IV induction Ventilation: Mask ventilation without difficulty and Oral airway inserted - appropriate to patient size Laryngoscope Size: Glidescope and 4 Grade View: Grade I Tube type: Oral Tube size: 7.5 mm Number of attempts: 1 Airway Equipment and Method: Oral airway,  Rigid stylet and Video-laryngoscopy Placement Confirmation: ETT inserted through vocal cords under direct vision,  positive ETCO2 and breath sounds checked- equal and bilateral Secured at: 23 cm Tube secured with: Tape (left side ) Dental Injury: Teeth and Oropharynx as per pre-operative assessment  Difficulty Due To: Difficulty was anticipated, Difficult Airway- due to reduced neck mobility and Difficult Airway- due to large tongue Comments: Morbid obesity- BMI 47. Glidescope used without issue.

## 2017-05-26 NOTE — Discharge Instructions (Signed)

## 2017-05-26 NOTE — Transfer of Care (Signed)
Immediate Anesthesia Transfer of Care Note  Patient: Mark [name]  Procedure(s) Performed: Procedure(s) with comments: SHOULDER ARTHROSCOPY WITH ROTATOR CUFF REPAIR AND SUBACROMIAL DECOMPRESSION (Right) - SHOULDER ARTHROSCOPY WITH ROTATOR CUFF REPAIR AND SUBACROMIAL DECOMPRESSION  Patient Location: PACU  Anesthesia Type:General  Level of Consciousness: drowsy and patient cooperative  Airway & Oxygen Therapy: Patient Spontanous Breathing and Patient connected to face mask oxygen  Post-op Assessment: Report given to RN, Post -op Vital signs reviewed and stable and Patient moving all extremities  Post vital signs: Reviewed and stable  Last Vitals:  Vitals:   05/26/17 1342  BP: 108/65  Pulse: 84  Resp: 18  Temp: 36.7 C    Last Pain:  Vitals:   05/26/17 1342  TempSrc: Oral      Patients Stated Pain Goal: 3 (54/27/06 2376)  Complications: No apparent anesthesia complications

## 2017-05-27 ENCOUNTER — Encounter (HOSPITAL_COMMUNITY): Payer: Self-pay | Admitting: Orthopedic Surgery

## 2017-11-02 ENCOUNTER — Other Ambulatory Visit: Payer: Self-pay

## 2017-11-02 DIAGNOSIS — T82510A Breakdown (mechanical) of surgically created arteriovenous fistula, initial encounter: Secondary | ICD-10-CM

## 2017-12-14 ENCOUNTER — Encounter: Payer: Self-pay | Admitting: Vascular Surgery

## 2017-12-14 ENCOUNTER — Ambulatory Visit (INDEPENDENT_AMBULATORY_CARE_PROVIDER_SITE_OTHER): Payer: 59 | Admitting: Vascular Surgery

## 2017-12-14 ENCOUNTER — Ambulatory Visit (HOSPITAL_COMMUNITY)
Admission: RE | Admit: 2017-12-14 | Discharge: 2017-12-14 | Disposition: A | Discharge status: Home / Self Care | Payer: 59 | Source: Ambulatory Visit | Attending: Vascular Surgery | Admitting: Vascular Surgery

## 2017-12-14 VITALS — BP 155/91 | HR 76 | Resp 20 | Ht 69.0 in | Wt 323.0 lb

## 2017-12-14 DIAGNOSIS — T82510A Breakdown (mechanical) of surgically created arteriovenous fistula, initial encounter: Secondary | ICD-10-CM

## 2017-12-14 DIAGNOSIS — N184 Chronic kidney disease, stage 4 (severe): Secondary | ICD-10-CM | POA: Diagnosis not present

## 2017-12-14 DIAGNOSIS — Z9889 Other specified postprocedural states: Secondary | ICD-10-CM | POA: Insufficient documentation

## 2017-12-14 NOTE — Progress Notes (Signed)
Patient name: Mark [name] MRN: [id number] DOB: 10/18/1961 Sex: male  REASON FOR VISIT:   To reassess left radiocephalic AV fistula.  The consult is requested by Dr. Jamal Maes.  HPI:   Mark [name] is a pleasant 57 y.o. male who had a left radiocephalic fistula placed on 09/07/2016.  He is not on dialysis.  His kidney function and actually been improving some.  He denies any pain or paresthesias in his left arm.  Dr. Jamal Maes was somewhat concerned that the fistula was aneurysmal near the wrist.  He was sent for evaluation.  Past Medical History:  Diagnosis Date  . Anemia    labs today indicate (09/27/11)  . Arthritis    right knee  . Bleeding ulcer    hx of, required 6 units blood  . Blood transfusion    6 units with ulcer repair surgery  . Chronic kidney disease    stage IV, has fistula, never started diaylsis  . Complication of anesthesia    little slow to wake up with last surgery  . Dyspnea    with exertion  . Gout   . Hypertension   . Osteoporosis   . Pneumonia     Family History  Problem Relation Age of Onset  . Cancer Mother   . Atrial fibrillation Mother   . Hypertension Mother   . Cancer Father     SOCIAL HISTORY: Social History   Tobacco Use  . Smoking status: Current Some Day Smoker    Packs/day: 0.25    Years: 32.00    Pack years: 8.00    Types: Cigarettes  . Smokeless tobacco: Former Systems developer    Types: Chew    Quit date: 44  . Tobacco comment: smoking cessation consult entered  Substance Use Topics  . Alcohol use: Yes    Alcohol/week: 3.0 oz    Types: 5 Cans of beer per week    Comment: occasional    Allergies  Allergen Reactions  . Penicillins Swelling and Other (See Comments)    Arm swells & breaks out  Eyes swelled Has patient had a PCN reaction causing immediate rash, facial/tongue/throat swelling, SOB or lightheadedness with hypotension: Yes Has patient had a PCN reaction causing severe rash involving mucus  membranes or skin necrosis: Yes Has patient had a PCN reaction that required hospitalization: No Has patient had a PCN reaction occurring within the last 10 years: No If all of the above answers are "NO", then may proceed with Cephalosporin use.     Current Outpatient Medications  Medication Sig Dispense Refill  . allopurinol (ZYLOPRIM) 300 MG tablet Take 300 mg by mouth daily.      . carvedilol (COREG) 25 MG tablet Take 37.5 mg by mouth 2 (two) times daily. Takes 1.5 tablet    . furosemide (LASIX) 40 MG tablet TAKE 40 MG BY MOUTH TWICE A DAY IN THE MORNING AND EARLY TO MID- AFTERNOON  6  . Multiple Vitamins-Minerals (CENTRUM SILVER 50+MEN PO) Take 1 tablet by mouth daily.     . SENSIPAR 30 MG tablet Take 30 mg by mouth daily.  5  . sodium bicarbonate 650 MG tablet Take 650 mg by mouth 2 (two) times daily.   5  . tetrahydrozoline 0.05 % ophthalmic solution Place 1 drop into both eyes daily as needed (allergies and redness).    Marland Kitchen docusate sodium (COLACE) 100 MG capsule Take 1 capsule (100 mg total) by mouth 3 (three) times daily as needed. (  Patient not taking: Reported on 12/14/2017) 20 capsule 0  . methocarbamol (ROBAXIN) 500 MG tablet Take 1 tablet (500 mg total) by mouth every 8 (eight) hours as needed for muscle spasms. (Patient not taking: Reported on 12/14/2017) 30 tablet 0  . oxyCODONE-acetaminophen (ROXICET) 5-325 MG tablet Take 1-2 tablets by mouth every 4 (four) hours as needed for severe pain. (Patient not taking: Reported on 12/14/2017) 30 tablet 0   No current facility-administered medications for this visit.     REVIEW OF SYSTEMS:  [X]  denotes positive finding, [ ]  denotes negative finding Cardiac  Comments:  Chest pain or chest pressure:    Shortness of breath upon exertion: x   Short of breath when lying flat:    Irregular heart rhythm:        Vascular    Pain in calf, thigh, or hip brought on by ambulation:    Pain in feet at night that wakes you up from your sleep:       Blood clot in your veins:    Leg swelling:         Pulmonary    Oxygen at home:    Productive cough:  x   Wheezing:         Neurologic    Sudden weakness in arms or legs:     Sudden numbness in arms or legs:     Sudden onset of difficulty speaking or slurred speech:    Temporary loss of vision in one eye:     Problems with dizziness:         Gastrointestinal    Blood in stool:     Vomited blood:         Genitourinary    Burning when urinating:     Blood in urine:        Psychiatric    Major depression:         Hematologic    Bleeding problems:    Problems with blood clotting too easily:        Skin    Rashes or ulcers:        Constitutional    Fever or chills:     PHYSICAL EXAM:   Vitals:   12/14/17 0925 12/14/17 0926  BP: (!) 152/95 (!) 155/91  Pulse: 76   Resp: 20   SpO2: 99%   Weight: (!) 323 lb (146.5 kg)   Height: 5\' 9"  (1.753 m)     GENERAL: The patient is a well-nourished male, in no acute distress. The vital signs are documented above. CARDIAC: There is a regular rate and rhythm.  VASCULAR: His left radiocephalic fistula has an excellent thrill.  It is somewhat dilated near the anastomosis. The left hand is warm and well-perfused. PULMONARY: There is good air exchange bilaterally without wheezing or rales.   DATA:    DUPLEX LEFT AV FISTULA: I benefit on interpreted the duplex of the left AV fistula.  The diameters of the fistula in the forearm range from 0.9-1.3 cm.  MEDICAL ISSUES:   STATUS POST LEFT RADIOCEPHALIC AV FISTULA: The fistula is working well.  It is somewhat dilated near the anastomosis but I do not see any real problems with the fistula on duplex.  I would not recommend a fistulogram or any surgery at this point.  If this enlarges in the future we could consider reevaluation.  However for now I would leave it alone.  Hopefully he will not need it for dialysis but certainly if he  does it should be ready for use.  I will see him back  as needed.  [name] [facility] [phone]

## 2019-11-13 ENCOUNTER — Other Ambulatory Visit (HOSPITAL_COMMUNITY): Payer: Self-pay | Admitting: *Deleted

## 2019-11-14 ENCOUNTER — Other Ambulatory Visit: Payer: Self-pay

## 2019-11-14 ENCOUNTER — Encounter (HOSPITAL_COMMUNITY)
Admission: RE | Admit: 2019-11-14 | Discharge: 2019-11-14 | Disposition: A | Discharge status: Home / Self Care | Payer: 59 | Source: Ambulatory Visit | Attending: Nephrology | Admitting: Nephrology

## 2019-11-14 DIAGNOSIS — N189 Chronic kidney disease, unspecified: Secondary | ICD-10-CM | POA: Insufficient documentation

## 2019-11-14 DIAGNOSIS — D631 Anemia in chronic kidney disease: Secondary | ICD-10-CM | POA: Diagnosis present

## 2019-11-14 MED ORDER — SODIUM CHLORIDE 0.9 % IV SOLN
510.0000 mg | Freq: Once | INTRAVENOUS | Status: AC
  Administered 2019-11-14: 11:00:00 510 mg via INTRAVENOUS
  Filled 2019-11-14: qty 17

## 2020-02-18 ENCOUNTER — Other Ambulatory Visit (HOSPITAL_COMMUNITY): Payer: Self-pay | Admitting: *Deleted

## 2020-02-19 ENCOUNTER — Inpatient Hospital Stay (HOSPITAL_COMMUNITY): Admission: RE | Admit: 2020-02-19 | Payer: 59 | Source: Ambulatory Visit

## 2020-04-29 ENCOUNTER — Emergency Department (HOSPITAL_COMMUNITY): Payer: 59

## 2020-04-29 ENCOUNTER — Encounter (HOSPITAL_COMMUNITY): Payer: Self-pay | Admitting: Emergency Medicine

## 2020-04-29 ENCOUNTER — Inpatient Hospital Stay (HOSPITAL_COMMUNITY): Payer: 59

## 2020-04-29 ENCOUNTER — Inpatient Hospital Stay (HOSPITAL_COMMUNITY)
Admission: EM | Admit: 2020-04-29 | Discharge: 2020-05-03 | DRG: 683 | Disposition: A | Discharge status: Home / Self Care | Payer: 59 | Attending: Internal Medicine | Admitting: Internal Medicine

## 2020-04-29 ENCOUNTER — Other Ambulatory Visit: Payer: Self-pay

## 2020-04-29 DIAGNOSIS — N189 Chronic kidney disease, unspecified: Secondary | ICD-10-CM

## 2020-04-29 DIAGNOSIS — F17210 Nicotine dependence, cigarettes, uncomplicated: Secondary | ICD-10-CM | POA: Diagnosis present

## 2020-04-29 DIAGNOSIS — M1711 Unilateral primary osteoarthritis, right knee: Secondary | ICD-10-CM | POA: Diagnosis present

## 2020-04-29 DIAGNOSIS — K5792 Diverticulitis of intestine, part unspecified, without perforation or abscess without bleeding: Secondary | ICD-10-CM | POA: Insufficient documentation

## 2020-04-29 DIAGNOSIS — N179 Acute kidney failure, unspecified: Secondary | ICD-10-CM | POA: Diagnosis present

## 2020-04-29 DIAGNOSIS — I129 Hypertensive chronic kidney disease with stage 1 through stage 4 chronic kidney disease, or unspecified chronic kidney disease: Secondary | ICD-10-CM | POA: Diagnosis present

## 2020-04-29 DIAGNOSIS — Z20822 Contact with and (suspected) exposure to covid-19: Secondary | ICD-10-CM | POA: Diagnosis present

## 2020-04-29 DIAGNOSIS — D509 Iron deficiency anemia, unspecified: Secondary | ICD-10-CM | POA: Diagnosis present

## 2020-04-29 DIAGNOSIS — Z79891 Long term (current) use of opiate analgesic: Secondary | ICD-10-CM

## 2020-04-29 DIAGNOSIS — D631 Anemia in chronic kidney disease: Secondary | ICD-10-CM | POA: Diagnosis present

## 2020-04-29 DIAGNOSIS — N184 Chronic kidney disease, stage 4 (severe): Secondary | ICD-10-CM | POA: Diagnosis present

## 2020-04-29 DIAGNOSIS — E860 Dehydration: Secondary | ICD-10-CM | POA: Diagnosis present

## 2020-04-29 DIAGNOSIS — M109 Gout, unspecified: Secondary | ICD-10-CM | POA: Diagnosis present

## 2020-04-29 DIAGNOSIS — R1012 Left upper quadrant pain: Secondary | ICD-10-CM

## 2020-04-29 DIAGNOSIS — K5732 Diverticulitis of large intestine without perforation or abscess without bleeding: Secondary | ICD-10-CM | POA: Diagnosis present

## 2020-04-29 DIAGNOSIS — J0510 Acute epiglottitis without obstruction: Secondary | ICD-10-CM | POA: Diagnosis present

## 2020-04-29 DIAGNOSIS — M549 Dorsalgia, unspecified: Secondary | ICD-10-CM | POA: Diagnosis present

## 2020-04-29 DIAGNOSIS — Z79899 Other long term (current) drug therapy: Secondary | ICD-10-CM | POA: Diagnosis not present

## 2020-04-29 DIAGNOSIS — Z8711 Personal history of peptic ulcer disease: Secondary | ICD-10-CM

## 2020-04-29 DIAGNOSIS — Z880 Allergy status to penicillin: Secondary | ICD-10-CM

## 2020-04-29 DIAGNOSIS — K429 Umbilical hernia without obstruction or gangrene: Secondary | ICD-10-CM | POA: Diagnosis present

## 2020-04-29 DIAGNOSIS — Z6837 Body mass index (BMI) 37.0-37.9, adult: Secondary | ICD-10-CM | POA: Diagnosis not present

## 2020-04-29 DIAGNOSIS — E872 Acidosis: Secondary | ICD-10-CM | POA: Diagnosis present

## 2020-04-29 DIAGNOSIS — Z8249 Family history of ischemic heart disease and other diseases of the circulatory system: Secondary | ICD-10-CM

## 2020-04-29 DIAGNOSIS — K5730 Diverticulosis of large intestine without perforation or abscess without bleeding: Secondary | ICD-10-CM

## 2020-04-29 DIAGNOSIS — E669 Obesity, unspecified: Secondary | ICD-10-CM | POA: Diagnosis present

## 2020-04-29 DIAGNOSIS — M810 Age-related osteoporosis without current pathological fracture: Secondary | ICD-10-CM | POA: Diagnosis present

## 2020-04-29 LAB — CBC
HCT: 27.9 % — ABNORMAL LOW (ref 39.0–52.0)
Hemoglobin: 8.8 g/dL — ABNORMAL LOW (ref 13.0–17.0)
MCH: 33.3 pg (ref 26.0–34.0)
MCHC: 31.5 g/dL (ref 30.0–36.0)
MCV: 105.7 fL — ABNORMAL HIGH (ref 80.0–100.0)
Platelets: 212 10*3/uL (ref 150–400)
RBC: 2.64 MIL/uL — ABNORMAL LOW (ref 4.22–5.81)
RDW: 14.7 % (ref 11.5–15.5)
WBC: 8.5 10*3/uL (ref 4.0–10.5)
nRBC: 0 % (ref 0.0–0.2)

## 2020-04-29 LAB — COMPREHENSIVE METABOLIC PANEL
ALT: 14 U/L (ref 0–44)
AST: 20 U/L (ref 15–41)
Albumin: 3.1 g/dL — ABNORMAL LOW (ref 3.5–5.0)
Alkaline Phosphatase: 61 U/L (ref 38–126)
Anion gap: 17 — ABNORMAL HIGH (ref 5–15)
BUN: 92 mg/dL — ABNORMAL HIGH (ref 6–20)
CO2: 18 mmol/L — ABNORMAL LOW (ref 22–32)
Calcium: 11.3 mg/dL — ABNORMAL HIGH (ref 8.9–10.3)
Chloride: 102 mmol/L (ref 98–111)
Creatinine, Ser: 5.24 mg/dL — ABNORMAL HIGH (ref 0.61–1.24)
GFR calc Af Amer: 13 mL/min — ABNORMAL LOW (ref >60)
GFR calc non Af Amer: 11 mL/min — ABNORMAL LOW (ref >60)
Glucose, Bld: 108 mg/dL — ABNORMAL HIGH (ref 70–99)
Potassium: 3.9 mmol/L (ref 3.5–5.1)
Sodium: 137 mmol/L (ref 135–145)
Total Bilirubin: 0.8 mg/dL (ref 0.3–1.2)
Total Protein: 8.5 g/dL — ABNORMAL HIGH (ref 6.5–8.1)

## 2020-04-29 LAB — IRON AND TIBC
Iron: 21 ug/dL — ABNORMAL LOW (ref 45–182)
Saturation Ratios: 11 % — ABNORMAL LOW (ref 17.9–39.5)
TIBC: 193 ug/dL — ABNORMAL LOW (ref 250–450)
UIBC: 172 ug/dL

## 2020-04-29 LAB — TROPONIN I (HIGH SENSITIVITY)
Troponin I (High Sensitivity): 31 ng/L — ABNORMAL HIGH (ref <18)
Troponin I (High Sensitivity): 27 ng/L — ABNORMAL HIGH (ref <18)

## 2020-04-29 LAB — HIV ANTIBODY (ROUTINE TESTING W REFLEX): HIV Screen 4th Generation wRfx: NONREACTIVE

## 2020-04-29 LAB — FERRITIN: Ferritin: 743 ng/mL — ABNORMAL HIGH (ref 24–336)

## 2020-04-29 LAB — LIPASE, BLOOD: Lipase: 45 U/L (ref 11–51)

## 2020-04-29 LAB — SARS CORONAVIRUS 2 BY RT PCR (HOSPITAL ORDER, PERFORMED IN ~~LOC~~ HOSPITAL LAB): SARS Coronavirus 2: NEGATIVE

## 2020-04-29 MED ORDER — ACETAMINOPHEN 325 MG PO TABS
325.0000 mg | ORAL_TABLET | Freq: Four times a day (QID) | ORAL | Status: DC | PRN
Start: 2020-04-29 — End: 2020-05-03

## 2020-04-29 MED ORDER — HYDROMORPHONE HCL 1 MG/ML IJ SOLN: 0.5000 mg | INTRAMUSCULAR | Status: DC | PRN

## 2020-04-29 MED ORDER — CARVEDILOL 25 MG PO TABS
25.0000 mg | ORAL_TABLET | Freq: Every morning | ORAL | Status: DC
  Administered 2020-04-30 – 2020-05-03 (×4): 25 mg via ORAL
  Filled 2020-04-29 (×4): qty 1

## 2020-04-29 MED ORDER — HEPARIN SODIUM (PORCINE) 5000 UNIT/ML IJ SOLN
5000.0000 [IU] | Freq: Two times a day (BID) | INTRAMUSCULAR | Status: DC
  Administered 2020-04-30 – 2020-05-03 (×7): 5000 [IU] via SUBCUTANEOUS
  Filled 2020-04-29 (×8): qty 1

## 2020-04-29 MED ORDER — ASPIRIN EC 81 MG PO TBEC: 162.0000 mg | DELAYED_RELEASE_TABLET | ORAL | Status: DC | PRN

## 2020-04-29 MED ORDER — MORPHINE SULFATE (PF) 4 MG/ML IV SOLN
4.0000 mg | Freq: Once | INTRAVENOUS | Status: AC
  Administered 2020-04-29: 4 mg via INTRAVENOUS
  Filled 2020-04-29: qty 1

## 2020-04-29 MED ORDER — ONDANSETRON HCL 4 MG PO TABS: 4.0000 mg | ORAL_TABLET | Freq: Four times a day (QID) | ORAL | Status: DC | PRN

## 2020-04-29 MED ORDER — LORATADINE 10 MG PO TABS: 10.0000 mg | ORAL_TABLET | Freq: Every day | ORAL | Status: DC | PRN

## 2020-04-29 MED ORDER — ONE-A-DAY MENS 50+ PO TABS: 1.0000 | ORAL_TABLET | Freq: Every day | ORAL | Status: DC

## 2020-04-29 MED ORDER — HYDRALAZINE HCL 25 MG PO TABS: 25.0000 mg | ORAL_TABLET | Freq: Four times a day (QID) | ORAL | Status: DC | PRN

## 2020-04-29 MED ORDER — SODIUM CHLORIDE 0.9 % IV SOLN
1.0000 g | Freq: Once | INTRAVENOUS | Status: AC
  Administered 2020-04-29: 18:00:00 1 g via INTRAVENOUS
  Filled 2020-04-29: qty 10

## 2020-04-29 MED ORDER — SODIUM CHLORIDE 0.9% FLUSH: 3.0000 mL | Freq: Once | INTRAVENOUS | Status: DC

## 2020-04-29 MED ORDER — SODIUM CHLORIDE 0.9 % IV BOLUS
1000.0000 mL | Freq: Once | INTRAVENOUS | Status: AC
  Administered 2020-04-29: 15:00:00 1000 mL via INTRAVENOUS

## 2020-04-29 MED ORDER — OXYCODONE HCL 5 MG PO TABS
5.0000 mg | ORAL_TABLET | ORAL | Status: DC | PRN
  Administered 2020-04-30: 5 mg via ORAL
  Filled 2020-04-29: qty 1

## 2020-04-29 MED ORDER — SODIUM CHLORIDE 0.9 % IV SOLN
2.0000 g | INTRAVENOUS | Status: DC
  Administered 2020-04-30 – 2020-05-01 (×2): 2 g via INTRAVENOUS
  Filled 2020-04-29: qty 2
  Filled 2020-04-29 (×2): qty 20

## 2020-04-29 MED ORDER — METRONIDAZOLE IN NACL 5-0.79 MG/ML-% IV SOLN
500.0000 mg | Freq: Once | INTRAVENOUS | Status: AC
  Administered 2020-04-29: 18:00:00 500 mg via INTRAVENOUS
  Filled 2020-04-29: qty 100

## 2020-04-29 MED ORDER — NAPHAZOLINE-GLYCERIN 0.012-0.2 % OP SOLN
1.0000 [drp] | Freq: Four times a day (QID) | OPHTHALMIC | Status: DC | PRN
  Filled 2020-04-29: qty 15

## 2020-04-29 MED ORDER — ONDANSETRON HCL 4 MG/2ML IJ SOLN: 4.0000 mg | Freq: Four times a day (QID) | INTRAMUSCULAR | Status: DC | PRN

## 2020-04-29 MED ORDER — CINACALCET HCL 30 MG PO TABS
30.0000 mg | ORAL_TABLET | Freq: Every day | ORAL | Status: DC
  Administered 2020-04-30 – 2020-05-03 (×4): 30 mg via ORAL
  Filled 2020-04-29 (×4): qty 1

## 2020-04-29 MED ORDER — SODIUM CHLORIDE 0.9 % IV SOLN: INTRAVENOUS | Status: AC

## 2020-04-29 MED ORDER — ALLOPURINOL 300 MG PO TABS
300.0000 mg | ORAL_TABLET | Freq: Every day | ORAL | Status: DC
  Administered 2020-04-30 – 2020-05-03 (×4): 300 mg via ORAL
  Filled 2020-04-29 (×4): qty 1

## 2020-04-29 NOTE — H&P (Signed)
History and Physical    ALDAHIR LITAKER ESP:[id number] DOB: 08/24/1961 DOA: 04/29/2020  PCP: Delrae Rend, MD (Confirm with patient/family/NH records and if not entered, this has to be entered at [facility] point of entry) Patient coming from: Home  I have personally briefly reviewed patient's old medical records in [place]  Chief Complaint: Abd pain, diarrhea  HPI: Mark [name] is a 59 y.o. male with medical history significant of HTN, CKD stage IV, Gout, gastric ulcer, presented with new onset abdominal pain diarrhea for 4 days.  He describes abdominal pain as constant and gradually getting worse located in the lower abdomen, aching like was 5-6 over 10 now is 8/10, and also has had 1-2 loose bowel movement every day and sometimes watery denied any blood or mucous in the stool.  Denies any fever chills.  He feels nauseous and in most small bite of food will trigger abdominal pain worse so he has not been eating and drinking very much for last 3 to 4 days.  He felt very dehydrated. ED Course: CT abdomen showed acute diverticulitis, WBC 8.8, creatinine 5.2 compared to 3.2 in May this year.  Review of Systems: As per HPI otherwise 10 point review of systems negative.    Past Medical History:  Diagnosis Date  . Anemia    labs today indicate (09/27/11)  . Arthritis    right knee  . Bleeding ulcer    hx of, required 6 units blood  . Blood transfusion    6 units with ulcer repair surgery  . Chronic kidney disease    stage IV, has fistula, never started diaylsis  . Complication of anesthesia    little slow to wake up with last surgery  . Dyspnea    with exertion  . Gout   . Hypertension   . Osteoporosis   . Pneumonia     Past Surgical History:  Procedure Laterality Date  . AV FISTULA PLACEMENT Left 09/07/2016   Procedure: ARTERIOVENOUS (AV) FISTULA CREATION LEFT ARM;  Surgeon: Angelia Mould, MD;  Location: [place];  Service: Vascular;  Laterality: Left;  .  COLONOSCOPY    . EXTERNAL EAR SURGERY    . HIP PINNING  11/2010  . JOINT REPLACEMENT    . OTHER SURGICAL HISTORY  ~ 08/1998   repair bleeding ulcer  . SHOULDER ARTHROSCOPY WITH ROTATOR CUFF REPAIR AND SUBACROMIAL DECOMPRESSION Right 05/26/2017   Procedure: SHOULDER ARTHROSCOPY WITH ROTATOR CUFF REPAIR AND SUBACROMIAL DECOMPRESSION;  Surgeon: Tania Ade, MD;  Location: [place];  Service: Orthopedics;  Laterality: Right;  SHOULDER ARTHROSCOPY WITH ROTATOR CUFF REPAIR AND SUBACROMIAL DECOMPRESSION  . TOTAL HIP ARTHROPLASTY  10/06/11   left  . TOTAL HIP ARTHROPLASTY  10/06/2011   Procedure: TOTAL HIP ARTHROPLASTY;  Surgeon: Kerin Salen;  Location: [place];  Service: Orthopedics;  Laterality: Left;     reports that he has been smoking cigarettes. He has a 8.00 pack-year smoking history. He quit smokeless tobacco use about 46 years ago.  His smokeless tobacco use included chew. He reports current alcohol use of about 5.0 standard drinks of alcohol per week. He reports that he does not use drugs.  Allergies  Allergen Reactions  . Penicillins Swelling, Rash and Other (See Comments)    Arms and eyes swell & skin breaks out  Has patient had a PCN reaction causing immediate rash, facial/tongue/throat swelling, SOB or lightheadedness with hypotension: Yes Has patient had a PCN reaction causing severe rash involving mucus membranes  or skin necrosis: Yes Has patient had a PCN reaction that required hospitalization: No Has patient had a PCN reaction occurring within the last 10 years: No If all of the above answers are "NO", then may proceed with Cephalosporin use.     Family History  Problem Relation Age of Onset  . Cancer Mother   . Atrial fibrillation Mother   . Hypertension Mother   . Cancer Father      Prior to Admission medications   Medication Sig Start Date End Date Taking? Authorizing Provider  acetaminophen (TYLENOL) 325 MG tablet Take 325-650 mg by mouth every 6 (six) hours as  needed for mild pain or headache.   Yes [provider]  allopurinol (ZYLOPRIM) 300 MG tablet Take 300 mg by mouth daily.     Yes [provider]  aspirin EC 81 MG tablet Take 162 mg by mouth as needed (for headaches). Swallow whole   Yes [provider]  carvedilol (COREG) 25 MG tablet Take 25 mg by mouth in the morning.    Yes [provider]  furosemide (LASIX) 40 MG tablet Take 40 mg by mouth in the morning.  05/15/15  Yes [provider]  loratadine (CLARITIN) 10 MG tablet Take 10 mg by mouth daily as needed for allergies or rhinitis.   Yes [provider]  Multiple Vitamins-Minerals (ONE-A-DAY MENS 50+) TABS Take 1 tablet by mouth daily with breakfast.   Yes [provider]  sodium bicarbonate 650 MG tablet Take 650 mg by mouth 3 (three) times daily.  05/16/15  Yes [provider]  tetrahydrozoline 0.05 % ophthalmic solution Place 1 drop into both eyes as needed (allergies and redness).    Yes [provider]  docusate sodium (COLACE) 100 MG capsule Take 1 capsule (100 mg total) by mouth 3 (three) times daily as needed. Patient not taking: Reported on 04/29/2020 05/26/17   Grier Mitts, PA-C  methocarbamol (ROBAXIN) 500 MG tablet Take 1 tablet (500 mg total) by mouth every 8 (eight) hours as needed for muscle spasms. Patient not taking: Reported on 12/14/2017 05/26/17   Grier Mitts, PA-C  oxyCODONE-acetaminophen (ROXICET) 5-325 MG tablet Take 1-2 tablets by mouth every 4 (four) hours as needed for severe pain. Patient not taking: Reported on 12/14/2017 05/26/17   Grier Mitts, PA-C  SENSIPAR 30 MG tablet Take 30 mg by mouth daily. Patient not taking: Reported on 04/29/2020 05/13/15   [provider]    Physical Exam: Vitals:   04/29/20 1500 04/29/20 1515 04/29/20 1615 04/29/20 1816  BP: (!) 122/109 (!) 124/93 (!) 129/93 (!) 134/95  Pulse:  83 76 94  Resp: 15 19 20  (!) 26  Temp:        TempSrc:      SpO2:  100% 100% 90%  Weight:      Height:        Constitutional: NAD, calm, comfortable Vitals:   04/29/20 1500 04/29/20 1515 04/29/20 1615 04/29/20 1816  BP: (!) 122/109 (!) 124/93 (!) 129/93 (!) 134/95  Pulse:  83 76 94  Resp: 15 19 20  (!) 26  Temp:      TempSrc:      SpO2:  100% 100% 90%  Weight:      Height:       Eyes: PERRL, lids and conjunctivae normal ENMT: Mucous membranes are moist. Posterior pharynx clear of any exudate or lesions.Normal dentition.  Neck: normal, supple, no masses, no thyromegaly Respiratory: clear to auscultation bilaterally, no wheezing,  no crackles. Normal respiratory effort. No accessory muscle use.  Cardiovascular: Regular rate and rhythm, no murmurs / rubs / gallops. No extremity edema. 2+ pedal pulses. No carotid bruits.  Abdomen: mild tenderness on LLQ no rebound no guarding, no masses palpated. No hepatosplenomegaly. Bowel sounds positive. Nontender umbilical hernia Musculoskeletal: no clubbing / cyanosis. No joint deformity upper and lower extremities. Good ROM, no contractures. Normal muscle tone.   Skin: no rashes, lesions, ulcers. No induration Neurologic: CN 2-12 grossly intact. Sensation intact, DTR normal. Strength 5/5 in all 4.  Psychiatric: Normal judgment and insight. Alert and oriented x 3. Normal mood.     Labs on Admission: I have personally reviewed following labs and imaging studies  CBC: Recent Labs  Lab 04/29/20 0826  WBC 8.5  HGB 8.8*  HCT 27.9*  MCV 105.7*  PLT 347   Basic Metabolic Panel: Recent Labs  Lab 04/29/20 0826  NA 137  K 3.9  CL 102  CO2 18*  GLUCOSE 108*  BUN 92*  CREATININE 5.24*  CALCIUM 11.3*   GFR: Estimated Creatinine Clearance: 18.9 mL/min (A) (by C-G formula based on SCr of 5.24 mg/dL (H)). Liver Function Tests: Recent Labs  Lab 04/29/20 0826  AST 20  ALT 14  ALKPHOS 61  BILITOT 0.8  PROT 8.5*  ALBUMIN 3.1*   Recent Labs  Lab 04/29/20 0826  LIPASE 45    No results for input(s): AMMONIA in the last 168 hours. Coagulation Profile: No results for input(s): INR, PROTIME in the last 168 hours. Cardiac Enzymes: No results for input(s): CKTOTAL, CKMB, CKMBINDEX, TROPONINI in the last 168 hours. BNP (last 3 results) No results for input(s): PROBNP in the last 8760 hours. HbA1C: No results for input(s): HGBA1C in the last 72 hours. CBG: No results for input(s): GLUCAP in the last 168 hours. Lipid Profile: No results for input(s): CHOL, HDL, LDLCALC, TRIG, CHOLHDL, LDLDIRECT in the last 72 hours. Thyroid Function Tests: No results for input(s): TSH, T4TOTAL, FREET4, T3FREE, THYROIDAB in the last 72 hours. Anemia Panel: No results for input(s): VITAMINB12, FOLATE, FERRITIN, TIBC, IRON, RETICCTPCT in the last 72 hours. Urine analysis:    Component Value Date/Time   COLORURINE YELLOW 09/27/2011 [place] 09/27/2011 1317   LABSPEC 1.018 09/27/2011 1317   PHURINE 5.0 09/27/2011 1317   GLUCOSEU NEGATIVE 09/27/2011 1317   [place] 09/27/2011 [place] 09/27/2011 1317   KETONESUR NEGATIVE 09/27/2011 1317   PROTEINUR NEGATIVE 09/27/2011 1317   UROBILINOGEN 0.2 09/27/2011 1317   NITRITE NEGATIVE 09/27/2011 1317   LEUKOCYTESUR NEGATIVE 09/27/2011 1317    Radiological Exams on Admission: CT Abdomen Pelvis Wo Contrast  Result Date: 04/29/2020 CLINICAL DATA:  Abdominal pain and lower back pain. EXAM: CT CHEST, ABDOMEN AND PELVIS WITHOUT CONTRAST TECHNIQUE: Multidetector CT imaging of the chest, abdomen and pelvis was performed following the standard protocol without IV contrast. COMPARISON:  None. FINDINGS: CT CHEST FINDINGS Cardiovascular: There is mild calcification of the aortic arch. Normal heart size. No pericardial effusion. Mediastinum/Nodes: No enlarged mediastinal, hilar, or axillary lymph nodes. Thyroid gland, trachea, and esophagus demonstrate no significant findings. Lungs/Pleura: Very mild  atelectasis is seen within the left upper lobe and right lower lobe. There is no evidence of acute infiltrate, pleural effusion or pneumothorax. Musculoskeletal: Marked severity degenerative changes seen throughout the thoracic spine with a chronic deformity seen involving the T10 vertebral body. CT ABDOMEN PELVIS FINDINGS Hepatobiliary: No focal liver abnormality is seen. No gallstones, gallbladder wall thickening,  or biliary dilatation. Pancreas: Unremarkable. No pancreatic ductal dilatation or surrounding inflammatory changes. Spleen: Normal in size without focal abnormality. Adrenals/Urinary Tract: Adrenal glands are unremarkable. Kidneys are normal in size without renal calculi or hydronephrosis. A 1.7 cm cyst is seen along the lateral aspect of the mid left kidney. Bladder is unremarkable. Stomach/Bowel: Stomach is within normal limits. Surgical clips are seen along the esophageal hiatus. Appendix appears normal. No evidence of bowel dilatation. Mild thickening of the distal descending colon is seen with a mild amount of pericolonic inflammatory fat stranding. Noninflamed diverticula are seen throughout the large bowel. Vascular/Lymphatic: No significant vascular findings are present. No enlarged abdominal or pelvic lymph nodes. Reproductive: Prostate is unremarkable. Other: There is a 3.6 cm x 3.8 cm fat containing umbilical hernia. Musculoskeletal: Moderate to marked severity multilevel degenerative changes seen throughout the lumbar spine. IMPRESSION: 1. Mild distal descending colon diverticulitis. 2. Noninflamed diverticula throughout the remainder of the large bowel. 3. 3.6 cm x 3.8 cm fat containing umbilical hernia. 4. Marked severity degenerative changes throughout the thoracic and lumbar spine with a chronic deformity involving the T10 vertebral body. 5. Aortic atherosclerosis. Aortic Atherosclerosis (ICD10-I70.0). Electronically Signed   By: Virgina Norfolk M.D.   On: 04/29/2020 17:46   CT Chest  Wo Contrast  Result Date: 04/29/2020 CLINICAL DATA:  Umbilical pain and lower back pain. EXAM: CT CHEST, ABDOMEN AND PELVIS WITHOUT CONTRAST TECHNIQUE: Multidetector CT imaging of the chest, abdomen and pelvis was performed following the standard protocol without IV contrast. COMPARISON:  None. FINDINGS: CT CHEST FINDINGS Cardiovascular: There is mild calcification of the aortic arch. Normal heart size. No pericardial effusion. Mediastinum/Nodes: No enlarged mediastinal, hilar, or axillary lymph nodes. Thyroid gland, trachea, and esophagus demonstrate no significant findings. Lungs/Pleura: Very mild atelectasis is seen within the left upper lobe and right lower lobe. There is no evidence of acute infiltrate, pleural effusion or pneumothorax. Musculoskeletal: Marked severity degenerative changes seen throughout the thoracic spine with a chronic deformity seen involving the T10 vertebral body. CT ABDOMEN PELVIS FINDINGS Hepatobiliary: No focal liver abnormality is seen. No gallstones, gallbladder wall thickening, or biliary dilatation. Pancreas: Unremarkable. No pancreatic ductal dilatation or surrounding inflammatory changes. Spleen: Normal in size without focal abnormality. Adrenals/Urinary Tract: Adrenal glands are unremarkable. Kidneys are normal in size without renal calculi or hydronephrosis. A 1.7 cm cyst is seen along the lateral aspect of the mid left kidney. Bladder is unremarkable. Stomach/Bowel: Stomach is within normal limits. Surgical clips are seen along the esophageal hiatus. Appendix appears normal. No evidence of bowel dilatation. Mild thickening of the distal descending colon is seen with a mild amount of pericolonic inflammatory fat stranding. Noninflamed diverticula are seen throughout the large bowel. Vascular/Lymphatic: No significant vascular findings are present. No enlarged abdominal or pelvic lymph nodes. Reproductive: Prostate is unremarkable. Other: There is a 3.6 cm x 3.8 cm fat  containing umbilical hernia. Musculoskeletal: Moderate to marked severity multilevel degenerative changes seen throughout the lumbar spine. IMPRESSION: 1. Mild distal descending colon diverticulitis. 2. Noninflamed diverticula throughout the remainder of the large bowel. 3. 3.6 cm x 3.8 cm fat containing umbilical hernia. 4. Marked severity degenerative changes throughout the thoracic and lumbar spine with a chronic deformity involving the T10 vertebral body. 5. Aortic atherosclerosis. Aortic Atherosclerosis (ICD10-I70.0). Electronically Signed   By: Virgina Norfolk M.D.   On: 04/29/2020 17:47    EKG: Independently reviewed. NSR  Assessment/Plan Active Problems:   Diverticula of colon   AKI (acute kidney injury) ([place])  AKI on CKD stage IV -Probably related to dehydration from ongoing diarrhea and poor oral intake -Discussed with nephrologist recommend hydration recheck kidney function tomorrow, hopefully can avoid HD.  Patient is HD ready with a left arm AV fistula set up by nephrology recently. -Check renal ultrasound  Acute diverticulitis -Start abdomen regimen -Discussed with patient outpatient GI follow-up for colonoscopy in 4 to 6 weeks  HTN -Continue Coreg, hold Lasix as patient is dehydrated  Microcytic anemia -Chronic, H&H stable, most recent iron study was 5 years ago, suspect this is chronic anemia related to CKD,  -Iron study  Umbilical hernia -Not the source for abdominal pain  DVT prophylaxis: Heparin subcu Code Status: Full Code Family Communication: none at bedside Disposition Plan: Patient has complicated medical issues with acute epiglottitis with AKI, expect more than 2 midnight hospital to stabilize.   Consults called: Nephro Admission status: Tele admit   Lequita Halt MD [facility] Pager [phone]   04/29/2020, 7:19 PM

## 2020-04-29 NOTE — ED Triage Notes (Signed)
Patient arrives POV to the ED with complaints of umbilical hernia pain and lower back pain that started this past week.

## 2020-04-29 NOTE — ED Provider Notes (Signed)
[facility] EMERGENCY DEPARTMENT Provider Note   CSN: [id number] Arrival date & time: 04/29/20  0759     History Chief Complaint  Patient presents with  . Hernia  . Back Pain    Mark [name] is a 59 y.o. male.  HPI Patient reports for the past 4 days he has been having abdominal and back pain.  He reports it started on Friday and he felt uncomfortable in his central and upper abdomen.  He reports that has progressed and now he has got a discomfort that goes from the left side of his chest down to the left mid to lower abdomen.  Is aching in quality.  He perceives it somewhat more into the back than anteriorly.  He reports he cannot find a comfortable position.  Last night he tried multiple ways to lie down and had difficulty sleeping due to pain.  He reports he has had some diarrhea.  No blood in it.  He denies vomiting.  He has had loss of appetite.  He reports in the past 4 days he is only eat an orange and small sandwich.  He denies any numbness weakness or pain radiating into the legs.  Patient reports he does make urine and has been up urinating several times during the night.  Patient has history of kidney failure.  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  He does have a distant history of bleeding ulcer and ultimately required surgery.    Past Medical History:  Diagnosis Date  . Anemia    labs today indicate (09/27/11)  . Arthritis    right knee  . Bleeding ulcer    hx of, required 6 units blood  . Blood transfusion    6 units with ulcer repair surgery  . Chronic kidney disease    stage IV, has fistula, never started diaylsis  . Complication of anesthesia    little slow to wake up with last surgery  . Dyspnea    with exertion  . Gout   . Hypertension   . Osteoporosis   . Pneumonia     Patient Active Problem List   Diagnosis Date Noted  . Sinus tachycardia 10/21/2015  . DM (diabetes mellitus) ([place]) 10/09/2011  . HTN  (hypertension) 10/09/2011  . Post-traumatic osteoarthritis of left hip 10/06/2011    Past Surgical History:  Procedure Laterality Date  . AV FISTULA PLACEMENT Left 09/07/2016   Procedure: ARTERIOVENOUS (AV) FISTULA CREATION LEFT ARM;  Surgeon: Angelia Mould, MD;  Location: [facility];  Service: Vascular;  Laterality: Left;  . COLONOSCOPY    . EXTERNAL EAR SURGERY    . HIP PINNING  11/2010  . JOINT REPLACEMENT    . OTHER SURGICAL HISTORY  ~ 08/1998   repair bleeding ulcer  . SHOULDER ARTHROSCOPY WITH ROTATOR CUFF REPAIR AND SUBACROMIAL DECOMPRESSION Right 05/26/2017   Procedure: SHOULDER ARTHROSCOPY WITH ROTATOR CUFF REPAIR AND SUBACROMIAL DECOMPRESSION;  Surgeon: Tania Ade, MD;  Location: [facility];  Service: Orthopedics;  Laterality: Right;  SHOULDER ARTHROSCOPY WITH ROTATOR CUFF REPAIR AND SUBACROMIAL DECOMPRESSION  . TOTAL HIP ARTHROPLASTY  10/06/11   left  . TOTAL HIP ARTHROPLASTY  10/06/2011   Procedure: TOTAL HIP ARTHROPLASTY;  Surgeon: Kerin Salen;  Location: [facility];  Service: Orthopedics;  Laterality: Left;       Family History  Problem Relation Age of Onset  . Cancer Mother   . Atrial fibrillation Mother   . Hypertension Mother   .  Cancer Father     Social History   Tobacco Use  . Smoking status: Current Some Day Smoker    Packs/day: 0.25    Years: 32.00    Pack years: 8.00    Types: Cigarettes  . Smokeless tobacco: Former Systems developer    Types: Chew    Quit date: 54  . Tobacco comment: smoking cessation consult entered  Vaping Use  . Vaping Use: Never used  Substance Use Topics  . Alcohol use: Yes    Alcohol/week: 5.0 standard drinks    Types: 5 Cans of beer per week    Comment: occasional  . Drug use: No    Home Medications Prior to Admission medications   Medication Sig Start Date End Date Taking? Authorizing Provider  allopurinol (ZYLOPRIM) 300 MG tablet Take 300 mg by mouth daily.      [provider]  carvedilol (COREG) 25 MG tablet  Take 37.5 mg by mouth 2 (two) times daily. Takes 1.5 tablet    [provider]  docusate sodium (COLACE) 100 MG capsule Take 1 capsule (100 mg total) by mouth 3 (three) times daily as needed. Patient not taking: Reported on 12/14/2017 05/26/17   Grier Mitts, PA-C  furosemide (LASIX) 40 MG tablet TAKE 40 MG BY MOUTH TWICE A DAY IN THE MORNING AND EARLY TO MID- AFTERNOON 05/15/15   [provider]  methocarbamol (ROBAXIN) 500 MG tablet Take 1 tablet (500 mg total) by mouth every 8 (eight) hours as needed for muscle spasms. Patient not taking: Reported on 12/14/2017 05/26/17   Grier Mitts, PA-C  Multiple Vitamins-Minerals (CENTRUM SILVER 50+MEN PO) Take 1 tablet by mouth daily.     [provider]  oxyCODONE-acetaminophen (ROXICET) 5-325 MG tablet Take 1-2 tablets by mouth every 4 (four) hours as needed for severe pain. Patient not taking: Reported on 12/14/2017 05/26/17   Grier Mitts, PA-C  SENSIPAR 30 MG tablet Take 30 mg by mouth daily. 05/13/15   [provider]  sodium bicarbonate 650 MG tablet Take 650 mg by mouth 2 (two) times daily.  05/16/15   [provider]  tetrahydrozoline 0.05 % ophthalmic solution Place 1 drop into both eyes daily as needed (allergies and redness).    [provider]    Allergies    Penicillins  Review of Systems   Review of Systems 10 systems reviewed and negative except as per HPI Physical Exam Updated Vital Signs BP 102/65 (BP Location: Right Arm)   Pulse 90   Temp 97.7 F (36.5 C) (Oral)   Resp 20   Ht 5\' 9"  (1.753 m)   Wt 116.6 kg   SpO2 100%   BMI 37.95 kg/m   Physical Exam Constitutional:      Appearance: Normal appearance.  Eyes:     Extraocular Movements: Extraocular movements intact.     Conjunctiva/sclera: Conjunctivae normal.  Cardiovascular:     Rate and Rhythm: Normal rate and regular rhythm.  Pulmonary:     Effort: Pulmonary effort is normal.     Breath sounds: Normal  breath sounds.  Abdominal:     Comments: Abdomen soft.  Patient has a easily reducible about 3 cm umbilical hernia that is nontender.  Patient's throughout the abdomen does not elicit significant pain.  Femoral pulses are 2+ and symmetric.  Musculoskeletal:        General: No swelling or tenderness. Normal range of motion.     Right lower leg: No edema.     Left lower  leg: No edema.     Comments: Dialysis fistula at the left wrist.  Positive thrill.  Skin:    General: Skin is warm and dry.  Neurological:     General: No focal deficit present.     Mental Status: He is alert and oriented to person, place, and time.     Coordination: Coordination normal.  Psychiatric:        Mood and Affect: Mood normal.     ED Results / Procedures / Treatments   Labs (all labs ordered are listed, but only abnormal results are displayed) Labs Reviewed  COMPREHENSIVE METABOLIC PANEL - Abnormal; Notable for the following components:      Result Value   CO2 18 (*)    Glucose, Bld 108 (*)    BUN 92 (*)    Creatinine, Ser 5.24 (*)    Calcium 11.3 (*)    Total Protein 8.5 (*)    Albumin 3.1 (*)    GFR calc non Af Amer 11 (*)    GFR calc Af Amer 13 (*)    Anion gap 17 (*)    All other components within normal limits  CBC - Abnormal; Notable for the following components:   RBC 2.64 (*)    Hemoglobin 8.8 (*)    HCT 27.9 (*)    MCV 105.7 (*)    All other components within normal limits  LIPASE, BLOOD  URINALYSIS, ROUTINE W REFLEX MICROSCOPIC    EKG EKG Interpretation  Date/Time:  Tuesday April 29 2020 13:43:44 EDT Ventricular Rate:  77 PR Interval:    QRS Duration: 103 QT Interval:  398 QTC Calculation: 451 R Axis:   7 Text Interpretation: Sinus rhythm Atrial premature complexes Prolonged PR interval Probable anteroseptal infarct, old No significant change since last tracing Confirmed by Wandra Arthurs [phone]) on 04/29/2020 3:10:13 PM   Radiology No results found.  Procedures Procedures  (including critical care time)  Medications Ordered in ED Medications  sodium chloride flush (NS) 0.9 % injection 3 mL (3 mLs Intravenous Not Given 04/29/20 1305)    ED Course  I have reviewed the triage vital signs and the nursing notes.  Pertinent labs & imaging results that were available during my care of the patient were reviewed by me and considered in my medical decision making (see chart for details).    MDM Rules/Calculators/A&P                           Patient has been experiencing abdominal pain and back pain for 4 days.  This pain encompasses the left lateral chest upper and mid abdomen.  Patient perceives much of his pain to be in his back.  Patient treated with morphine for pain.  Abdomen is soft without guarding.  Nonacute abdomen.  Patient reports he has had diarrhea and loss of appetite.  He has not had active vomiting.  He reports poor oral intake.  We will proceed with noncontrast CT due to acute on chronic kidney injury.  Dr. Darl Householder to follow-up on additional studies and final disposition.  Final Clinical Impression(s) / ED Diagnoses Final diagnoses:  Left upper quadrant abdominal pain  Acute renal failure superimposed on chronic kidney disease, unspecified CKD stage, unspecified acute renal failure type [facility])    Rx / DC Orders ED Discharge Orders    None       Charlesetta Shanks, MD 04/29/20 1521

## 2020-04-29 NOTE — ED Provider Notes (Signed)
  Physical Exam  BP (!) 129/93   Pulse 76   Temp 97.7 F (36.5 C) (Oral)   Resp 20   Ht 5\' 9"  (1.753 m)   Wt 116.6 kg   SpO2 100%   BMI 37.95 kg/m   Physical Exam  ED Course/Procedures     Procedures  MDM  Care assumed at 3 pm.  Patient has history of CKD and has nausea vomiting for about a week and abdominal pain and diarrhea.  Patient's creatinine today is 5.2 and BUN is 92.  Signout pending nephrology consult as well as CT abdomen pelvis and chest.  6:06 PM I talked to Dr. Hollie Salk from nephrology.  She was able to review his chart from the clinic.  On May 13, his BUN was 64 and creatinine was 3.2 and Hg was 8.8. She recommend IVF and recheck kidney function in the morning. If patient's kidney function doesn't improve tomorrow, nephrology will see patient. CT ab/pel showed diverticulitis and umbilical hernia that is fat containing and easily reducible on exam. Hospitalist to admit for renal failure.       Drenda Freeze, MD 04/29/20 [phone]

## 2020-04-30 DIAGNOSIS — N184 Chronic kidney disease, stage 4 (severe): Secondary | ICD-10-CM

## 2020-04-30 LAB — CBC
HCT: 26.1 % — ABNORMAL LOW (ref 39.0–52.0)
Hemoglobin: 8.2 g/dL — ABNORMAL LOW (ref 13.0–17.0)
MCH: 33.2 pg (ref 26.0–34.0)
MCHC: 31.4 g/dL (ref 30.0–36.0)
MCV: 105.7 fL — ABNORMAL HIGH (ref 80.0–100.0)
Platelets: 200 10*3/uL (ref 150–400)
RBC: 2.47 MIL/uL — ABNORMAL LOW (ref 4.22–5.81)
RDW: 14.6 % (ref 11.5–15.5)
WBC: 7.4 10*3/uL (ref 4.0–10.5)
nRBC: 0 % (ref 0.0–0.2)

## 2020-04-30 LAB — BASIC METABOLIC PANEL
Anion gap: 15 (ref 5–15)
BUN: 89 mg/dL — ABNORMAL HIGH (ref 6–20)
CO2: 20 mmol/L — ABNORMAL LOW (ref 22–32)
Calcium: 10.9 mg/dL — ABNORMAL HIGH (ref 8.9–10.3)
Chloride: 105 mmol/L (ref 98–111)
Creatinine, Ser: 4.73 mg/dL — ABNORMAL HIGH (ref 0.61–1.24)
GFR calc Af Amer: 14 mL/min — ABNORMAL LOW (ref >60)
GFR calc non Af Amer: 12 mL/min — ABNORMAL LOW (ref >60)
Glucose, Bld: 84 mg/dL (ref 70–99)
Potassium: 3.9 mmol/L (ref 3.5–5.1)
Sodium: 140 mmol/L (ref 135–145)

## 2020-04-30 LAB — URINALYSIS, ROUTINE W REFLEX MICROSCOPIC
Bilirubin Urine: NEGATIVE
Glucose, UA: NEGATIVE mg/dL
Hgb urine dipstick: NEGATIVE
Ketones, ur: NEGATIVE mg/dL
Leukocytes,Ua: NEGATIVE
Nitrite: NEGATIVE
Protein, ur: NEGATIVE mg/dL
Specific Gravity, Urine: 1.009 (ref 1.005–1.030)
pH: 5 (ref 5.0–8.0)

## 2020-04-30 LAB — PHOSPHORUS: Phosphorus: 6.9 mg/dL — ABNORMAL HIGH (ref 2.5–4.6)

## 2020-04-30 MED ORDER — SODIUM CHLORIDE 0.9 % IV SOLN
510.0000 mg | Freq: Once | INTRAVENOUS | Status: AC
  Administered 2020-05-01: 510 mg via INTRAVENOUS
  Filled 2020-04-30: qty 17

## 2020-04-30 MED ORDER — FERROUS SULFATE 325 (65 FE) MG PO TABS
325.0000 mg | ORAL_TABLET | Freq: Every day | ORAL | Status: DC
  Administered 2020-05-01 – 2020-05-03 (×3): 325 mg via ORAL
  Filled 2020-04-30 (×3): qty 1

## 2020-04-30 MED ORDER — METRONIDAZOLE IN NACL 5-0.79 MG/ML-% IV SOLN
500.0000 mg | Freq: Three times a day (TID) | INTRAVENOUS | Status: DC
  Administered 2020-04-30 – 2020-05-01 (×4): 500 mg via INTRAVENOUS
  Filled 2020-04-30 (×4): qty 100

## 2020-04-30 MED ORDER — ENSURE ENLIVE PO LIQD
237.0000 mL | ORAL | Status: DC
  Administered 2020-04-30 – 2020-05-02 (×3): 237 mL via ORAL

## 2020-04-30 NOTE — Discharge Instructions (Signed)
Low Fiber Nutrition Therapy  You may need a low-fiber diet if you have Crohn's disease, diverticulitis, gastroparesis, ulcerative colitis, a new colostomy, or new ileostomy. A low-fiber diet may also be needed following radiation therapy to the pelvis and lower bowel or recent intestinal surgery.  A low-fiber diet reduces the frequency and volume of your stools. This lessens irritation to the gastrointestinal (GI) tract and can help you heal. Use this diet if you have a stricture so your intestine doesn't get blocked. The goal of this diet is to get less than 8 grams of fiber daily. It's also important to eat enough protein foods while you are on a low-fiber diet.  Drink nutrition supplements that have 1 gram of fiber or less in each serving. If your stricture is severe or if your inflammation is severe, drink more liquids to reduce symptoms and to get enough calories and protein.  Tips  Eat about 5 to 6 small meals daily or about every 3 to 4 hours. Do not skip meals.  Every time you eat, include a small amount of protein (1 to 2 ounces) plus an additional food. Low fiber starch foods are the best choice to eat with protein.  Limit acidic, spicy and high-fat or fried and greasy foods to reduce GI symptoms.  Do not eat raw fruits and vegetables while on this diet. All fruits and vegetables need to be cooked and without peels or skins.  Drink a lot of fluids, at least 8 cups of fluid each day. Limit drinks with caffeine, sugar, and sugar substitutes.  Plain water is the best choice. Avoid mixing drink packets or flavor drops into water.  Take a chewable multivitamin with minerals. Gummy vitamins do not have enough minerals and can block an ostomy and non-chewable supplements are not easily digested. Chewable supplements must be used if you have a stricture or ostomy.  If you are lactose intolerant, you may need to eat low-lactose dairy products. If you can't tolerate dairy, ask your RDN about  how you can get enough calcium from other foods.  Do not take a calcium supplement. They can cause a blockage.  It is important to add high-calcium foods gradually to your diet and monitor for symptoms to avoid a blockage.  Do not add more fiber to your diet until your health care provider or registered dietitian nutritionist (RDN) tells you it's OK. Fiber is part of whole grains, fruits and vegetables (foods from plants) and needs to be slowly added back into your diet when your body is healed.  Choose foods that have been safely handled and prepared to lower your risk of foodborne illness. Talk to your RDN or see the Food Safety Nutrition Therapy handout for more information.      Foods Recommended These foods are low in fat and fiber and will help with your GI symptoms.  Food Group Foods Recommended  Grains  Choose grain foods with less than 2 grams of fiber per serving. Refined white flour products--for example, enriched white bread without seeds, crackers or pasta Cream of wheat or rice Grits (fine ground) Tortillas: white flour or corn White rice, well-cooked (do not rinse, or soak before cooking) Cold and hot cereals made from white or refined flour such as crispy rice or corn flakes  Protein Foods  Lean, very tender, well-cooked poultry or fish; red meats: beef, pork or lamb (slow cook until soft; chop meats if you have stricture or ostomy) Eggs, well-cooked Smooth nut butters such  as almond, peanut, or sunflower Tofu  Dairy  If you have lactose intolerance, drinking milk products from cows or goats may make diarrhea worse. Foods marked with an asterisk (*) have lactose. Milk: fat-free, 1% or 2% * (choose best tolerated) Lactose-free milk Buttermilk* Fortified non-dairy milks: almond, cashew, coconut, or rice (be aware that these options are not good sources of protein so you will need to eat an additional protein food) Kefir* (Don't include kefir in the diet until approved  by your health care provider) Yogurt*/lactose-free yogurt (without nuts, fruit, granola or chocolate) Mild cheese* (hard and aged cheeses tend to be lower in lactose such as cheddar, swiss or parmesan) Cottage cheese* or lactose-free cottage cheese Low-fat ice cream* or lactose-free ice cream Sherbet* (usually lower lactose)  Vegetables  Canned and well-cooked vegetables without seeds, skins, or hulls Carrots or green beans, cooked White, red or yellow potatoes without skins Strained vegetable juice  Fruit Soft, and well-cooked fruits without skins, seeds, or membranes Canned fruit in juice: peaches, pears, or applesauce Fruit juice without pulp diluted by half with water may be tolerated better Fruit drinks fortified with vitamin C may be tolerated better than 100% fruit juice  Oils  When possible, choose healthy oils and fats, such as olive and canola oils, plant oils rather than solid fats.  Other  Broth and strained soups made from allowed foods Desserts (small portions) without whole grains, seeds, nuts, raisins, or coconut Jelly (clear)    Foods Not Recommended These foods are higher in fat and fiber and may make your GI symptoms worse.   Food Group Foods Not Recommended  Grains  Bread, whole wheat or with whole grain flour or seeds or nuts Brown rice, quinoa, kasha, barley Tortillas: whole grain Whole wheat pasta Whole grain and high-fiber cereals, including oatmeal, bran flakes or shredded wheat Popcorn  Protein Foods  Steak, pork chops, or other meats that are fatty or have gristle Fried meat, poultry, or fish Seafood with a tough or rubbery texture, such as shrimp Luncheon meats such as bologna and salami Sausage, bacon, or hot dogs Dried beans, peas, or lentils Hummus Sushi Nuts and chunky nut butters  Dairy  Whole milk Pea milk and soymilk (may cause diarrhea, gas, bloating, and abdominal pain) Cream Half-and-half Sour cream Yogurt  with added fruit, nuts, or granola or chocolate  Vegetables  Alfalfa or bean sprouts (high fiber and risk for bacteria) Raw or undercooked vegetables: beets; broccoli; brussels sprouts; cabbage; cauliflower; collard, mustard, or turnip greens; corn; cucumber; green peas or any kind of peas; kale; lima beans; mushrooms; okra; olives; pickles and relish; onions; parsnips; peppers; potato skins; sauerkraut; spinach; tomatoes  Fruit Raw fruit Dried fruit Avocado, berries, coconut Canned fruit in syrup Canned fruit with mandarin oranges, papaya or pineapple Fruit juice with pulp Prune juice Fruit skin  Oils  Pork rinds    Low-Fiber (8 grams) Sample 1-Day Menu  Breakfast  cup cream of wheat (0.5 gram fiber) 1 slice white toast (1 gram fiber) 1 teaspoon margarine, soft tub 2 scrambled eggs   Morning Snack 1 cup lactose-free nutrition supplement  Lunch 2 slices white bread (2 grams fiber)  3 tablespoons tuna 1 tablespoon mayonnaise 1 cup chicken noodle soup (1 gram fiber)   cup apple juice   Afternoon Snack 6 saltine crackers (0.5 gram fiber)  2 ounces low-fat cheddar cheese  Evening Meal 3 ounces tender chicken breast  1 cup white rice (0.5 gram fiber)   cup cooked  canned green beans (2 grams fiber)  cup cranberry juice   Evening Snack 1 cup lactose-free nutrition supplement

## 2020-04-30 NOTE — Progress Notes (Signed)
Initial Nutrition Assessment  RD working remotely.  DOCUMENTATION CODES:   Obesity unspecified  INTERVENTION:  Provide Ensure Enlive po once daily, each supplement provides 350 kcal and 20 grams of protein.  Reviewed "Low Fiber Nutrition Therapy" handout from the Academy [employer] with patient over the phone. Discussed indication for low-fiber diet in setting of acute diverticulitis. Reviewed foods that are high in fiber to limit/avoid and low-fiber alternatives. Discussed he would continue a low fiber diet until healed from diverticulitis and then could slowly start adding in fiber after acute diverticulitis is resolved.  NUTRITION DIAGNOSIS:   Inadequate oral intake related to decreased appetite, acute illness (diverticulitis) as evidenced by per patient/family report.  GOAL:   Patient will meet greater than or equal to 90% of their needs  MONITOR:   PO intake, Supplement acceptance, Labs, Weight trends, I & O's  REASON FOR ASSESSMENT:   Malnutrition Screening Tool    ASSESSMENT:   59 year old male with PMHx of gout, osteoporosis, HTN, arthritis, CKD stage IV, gastric ulcer admitted with AKI on CKD stage IV, acute diverticulitis.   Spoke with patient over the phone. He reports he had a decreased appetite for 3-4 days related to abdominal pain. He reports his appetite is improving now. He was able to eat dinner last night and breakfast and lunch today. He reports he is eating everything they send him on the trays. Patient reports he has not previously had diverticulitis. Reviewed low fiber nutrition therapy with patient and pasted handout into discharge instructions. Discussed importance of adequate intake. Patient is amenable to drinking an oral nutrition supplement to help meet needs but only wants to drink one per day.  Patient is not sure if he has lost weight acutely from the diverticulitis. Patient reports his UBW a while ago had been 320 lbs. He had been  slowly losing weight from eating less fast food and eating healthier at meals. He was 124.7 kg on 11/14/2019. He is now 115.2 kg (243.97 lbs). He has lost 9.5 kg (7.6% body weight) over the past 5 months, which is not significant for time frame.  Medications reviewed and include: allopurinol, NS at 125 mL/hr, ceftriaxone, Flagyl.  Labs reviewed: CO2 20, BUN 89, Creatinine 4.73, Phosphorus 6.9.  NUTRITION - FOCUSED PHYSICAL EXAM:  Unable to complete at this time as RD is working remotely.  Diet Order:   Diet Order            Diet Heart Room service appropriate? Yes; Fluid consistency: Thin  Diet effective now                EDUCATION NEEDS:   Education needs have been addressed  Skin:  Skin Assessment: Reviewed RN Assessment  Last BM:  04/29/2020  Height:   Ht Readings from Last 1 Encounters:  04/29/20 5\' 9"  (1.753 m)   Weight:   Wt Readings from Last 1 Encounters:  04/30/20 115.2 kg   BMI:  Body mass index is 37.5 kg/m.  Estimated Nutritional Needs:   Kcal:  2300-2500  Protein:  115-125 grams  Fluid:  2 L/day  Jacklynn Barnacle, MS, RD, LDN Pager number available on Amion

## 2020-04-30 NOTE — Progress Notes (Signed)
PROGRESS NOTE  Mark [name] YOV:[id number] DOB: 1961-08-04 DOA: 04/29/2020 PCP: Mark Rend, MD  HPI/Recap of past 24 hours: Mark [name] is a 59 y.o. male with medical history significant of HTN, CKD stage IV, Gout, gastric ulcer, presented with new onset abdominal pain diarrhea for 4 days.  He describes abdominal pain as constant and gradually getting worse located in the lower abdomen, aching like was 5-6 over 10 now is 8/10, and also has had 1-2 loose bowel movement every day and sometimes watery denied any blood or mucous in the stool.  Denies any fever chills.  He feels nauseous and in most small bite of food will trigger abdominal pain worse so he has not been eating and drinking very much for last 3 to 4 days.  He felt very dehydrated. ED Course: CT abdomen showed acute diverticulitis, WBC 8.8, creatinine 5.2 compared to 3.2 in May this year.  Review of Systems: As per HPI otherwise 10 point review of systems negative.   04/30/20: Seen and examined at bedside.  Nausea and abdominal pain are slowly improving.  Currently on Rocephin, IV Flagyl added for distal descending colon diverticulitis.  Assessment/Plan: Active Problems:   Diverticula of colon   Acute renal failure superimposed on chronic kidney disease (HCC)   Acute distal descending colon diverticulitis Seen on CT scan Started on Rocephin, added IV Flagyl Diet as tolerated IV antiemetics and pain control in place Will need to follow-up with GI for possible colonoscopy 4 to 6 weeks  AKI on CKD stage IV likely prerenal in the setting of dehydration from ongoing diarrhea and poor oral intake. -Previous hospitalist discussed with nephrologist recommend hydration recheck kidney function, hopefully can avoid HD.  Patient is HD ready with a left arm AV fistula set up by nephrology recently. Creatinine downtrending on IV fluids -Renal ultrasound 04/29/2020 shows medical disease.  No evidence of hydronephrosis Continue  to avoid nephrotoxins and hypotension Monitor urine output Repeat BMP in the morning  Anion gap metabolic acidosis likely secondary to acute renal insufficiency Presented with serum bicarb of 18 and anion gap of 17 Improved with IV fluid, serum bicarb 20 Continue to monitor  Hypophosphatemia in the setting of advanced CKD 4 Phosphorus 6.9 Defer management to nephrology  Essential HTN -Continue Coreg, hold Lasix as patient is dehydrated BP is at goal Continue to monitor vital signs  Microcytic anemia/anemia of chronic disease in the setting of advanced CKD -Chronic, H&H stable, most recent iron study was 5 years ago, suspect this is chronic anemia related to CKD,  -Iron study suggestive of iron deficiency Start ferrous sulfate  Umbilical hernia -Not the source for abdominal pain -Continue to monitor  DVT prophylaxis: Heparin subcu 3 times daily Code Status: Full Code Family Communication: none at bedside  Consults called: Nephro   Status is: Inpatient  Dispo: The patient is from: Home.               Anticipated d/c is to: Home.              Anticipated d/c date is: 05/02/2020              Patient currently not appropriate for discharge at this time due to ongoing treatment with IV antibiotics for acute diverticulitis.         Objective: Vitals:   04/30/20 0148 04/30/20 0221 04/30/20 0518 04/30/20 1234  BP:  132/83 128/76 107/62  Pulse:  80 83 92  Resp: 16 18 18  16  Temp: (!) 97.3 F (36.3 C) 97.9 F (36.6 C) 98.2 F (36.8 C) 98.1 F (36.7 C)  TempSrc: Oral Oral Oral Oral  SpO2:  100% 100% 99%  Weight:  115.2 kg    Height:        Intake/Output Summary (Last 24 hours) at 04/30/2020 1503 Last data filed at 04/30/2020 6712 Gross per 24 hour  Intake 2237.92 ml  Output 600 ml  Net 1637.92 ml   Filed Weights   04/29/20 0822 04/30/20 0221  Weight: 116.6 kg 115.2 kg    Exam:  . General: 59 y.o. year-old male well developed well nourished in no  acute distress.  Alert and oriented x3. . Cardiovascular: Regular rate and rhythm with no rubs or gallops.  No thyromegaly or JVD noted.   Marland Kitchen Respiratory: Clear to auscultation with no wheezes or rales. Good inspiratory effort. . Abdomen: Soft left lower quadrant abdominal tenderness with palpation.  Normal bowel sounds x4 quadrants. . Musculoskeletal: No lower extremity edema. 2/4 pulses in all 4 extremities. Marland Kitchen Psychiatry: Mood is appropriate for condition and setting   Data Reviewed: CBC: Recent Labs  Lab 04/29/20 0826 04/30/20 0431  WBC 8.5 7.4  HGB 8.8* 8.2*  HCT 27.9* 26.1*  MCV 105.7* 105.7*  PLT 212 458   Basic Metabolic Panel: Recent Labs  Lab 04/29/20 0826 04/30/20 0431  NA 137 140  K 3.9 3.9  CL 102 105  CO2 18* 20*  GLUCOSE 108* 84  BUN 92* 89*  CREATININE 5.24* 4.73*  CALCIUM 11.3* 10.9*  PHOS  --  6.9*   GFR: Estimated Creatinine Clearance: 20.8 mL/min (A) (by C-G formula based on SCr of 4.73 mg/dL (H)). Liver Function Tests: Recent Labs  Lab 04/29/20 0826  AST 20  ALT 14  ALKPHOS 61  BILITOT 0.8  PROT 8.5*  ALBUMIN 3.1*   Recent Labs  Lab 04/29/20 0826  LIPASE 45   No results for input(s): AMMONIA in the last 168 hours. Coagulation Profile: No results for input(s): INR, PROTIME in the last 168 hours. Cardiac Enzymes: No results for input(s): CKTOTAL, CKMB, CKMBINDEX, TROPONINI in the last 168 hours. BNP (last 3 results) No results for input(s): PROBNP in the last 8760 hours. HbA1C: No results for input(s): HGBA1C in the last 72 hours. CBG: No results for input(s): GLUCAP in the last 168 hours. Lipid Profile: No results for input(s): CHOL, HDL, LDLCALC, TRIG, CHOLHDL, LDLDIRECT in the last 72 hours. Thyroid Function Tests: No results for input(s): TSH, T4TOTAL, FREET4, T3FREE, THYROIDAB in the last 72 hours. Anemia Panel: Recent Labs    04/29/20 2156  FERRITIN 743*  TIBC 193*  IRON 21*   Urine analysis:    Component Value  Date/Time   COLORURINE YELLOW 04/29/2020 2350   APPEARANCEUR CLEAR 04/29/2020 2350   LABSPEC 1.009 04/29/2020 2350   PHURINE 5.0 04/29/2020 2350   GLUCOSEU NEGATIVE 04/29/2020 2350   HGBUR NEGATIVE 04/29/2020 2350   BILIRUBINUR NEGATIVE 04/29/2020 2350   KETONESUR NEGATIVE 04/29/2020 2350   PROTEINUR NEGATIVE 04/29/2020 2350   UROBILINOGEN 0.2 09/27/2011 1317   NITRITE NEGATIVE 04/29/2020 2350   LEUKOCYTESUR NEGATIVE 04/29/2020 2350   Sepsis Labs: @LABRCNTIP (procalcitonin:4,lacticidven:4)  ) Recent Results (from the past 240 hour(s))  SARS Coronavirus 2 by RT PCR (hospital order, performed in [place] hospital lab) Nasopharyngeal Nasopharyngeal Swab     Status: None   Collection Time: 04/29/20  4:55 PM   Specimen: Nasopharyngeal Swab  Result Value Ref Range Status   SARS  Coronavirus 2 NEGATIVE NEGATIVE Final    Comment: (NOTE) SARS-CoV-2 target nucleic acids are NOT DETECTED.  The SARS-CoV-2 RNA is generally detectable in upper and lower respiratory specimens during the acute phase of infection. The lowest concentration of SARS-CoV-2 viral copies this assay can detect is 250 copies / mL. A negative result does not preclude SARS-CoV-2 infection and should not be used as the sole basis for treatment or other patient management decisions.  A negative result may occur with improper specimen collection / handling, submission of specimen other than nasopharyngeal swab, presence of viral mutation(s) within the areas targeted by this assay, and inadequate number of viral copies (<250 copies / mL). A negative result must be combined with clinical observations, patient history, and epidemiological information.  Fact Sheet for Patients:   StrictlyIdeas.no  Fact Sheet for Healthcare Providers: BankingDealers.co.za  This test is not yet approved or  cleared by the [place] FDA and has been authorized for detection and/or diagnosis  of SARS-CoV-2 by FDA under an Emergency Use Authorization (EUA).  This EUA will remain in effect (meaning this test can be used) for the duration of the COVID-19 declaration under Section 564(b)(1) of the Act, 21 U.S.C. section 360bbb-3(b)(1), unless the authorization is terminated or revoked sooner.  Performed at [facility], [place] [address] [place], [place] [address]       Studies: CT Abdomen Pelvis Wo Contrast  Result Date: 04/29/2020 CLINICAL DATA:  Abdominal pain and lower back pain. EXAM: CT CHEST, ABDOMEN AND PELVIS WITHOUT CONTRAST TECHNIQUE: Multidetector CT imaging of the chest, abdomen and pelvis was performed following the standard protocol without IV contrast. COMPARISON:  None. FINDINGS: CT CHEST FINDINGS Cardiovascular: There is mild calcification of the aortic arch. Normal heart size. No pericardial effusion. Mediastinum/Nodes: No enlarged mediastinal, hilar, or axillary lymph nodes. Thyroid gland, trachea, and esophagus demonstrate no significant findings. Lungs/Pleura: Very mild atelectasis is seen within the left upper lobe and right lower lobe. There is no evidence of acute infiltrate, pleural effusion or pneumothorax. Musculoskeletal: Marked severity degenerative changes seen throughout the thoracic spine with a chronic deformity seen involving the T10 vertebral body. CT ABDOMEN PELVIS FINDINGS Hepatobiliary: No focal liver abnormality is seen. No gallstones, gallbladder wall thickening, or biliary dilatation. Pancreas: Unremarkable. No pancreatic ductal dilatation or surrounding inflammatory changes. Spleen: Normal in size without focal abnormality. Adrenals/Urinary Tract: Adrenal glands are unremarkable. Kidneys are normal in size without renal calculi or hydronephrosis. A 1.7 cm cyst is seen along the lateral aspect of the mid left kidney. Bladder is unremarkable. Stomach/Bowel: Stomach is within normal limits. Surgical clips are seen along the esophageal hiatus.  Appendix appears normal. No evidence of bowel dilatation. Mild thickening of the distal descending colon is seen with a mild amount of pericolonic inflammatory fat stranding. Noninflamed diverticula are seen throughout the large bowel. Vascular/Lymphatic: No significant vascular findings are present. No enlarged abdominal or pelvic lymph nodes. Reproductive: Prostate is unremarkable. Other: There is a 3.6 cm x 3.8 cm fat containing umbilical hernia. Musculoskeletal: Moderate to marked severity multilevel degenerative changes seen throughout the lumbar spine. IMPRESSION: 1. Mild distal descending colon diverticulitis. 2. Noninflamed diverticula throughout the remainder of the large bowel. 3. 3.6 cm x 3.8 cm fat containing umbilical hernia. 4. Marked severity degenerative changes throughout the thoracic and lumbar spine with a chronic deformity involving the T10 vertebral body. 5. Aortic atherosclerosis. Aortic Atherosclerosis (ICD10-I70.0). Electronically Signed   By: Virgina Norfolk M.D.   On: 04/29/2020 17:46  CT Chest Wo Contrast  Result Date: 04/29/2020 CLINICAL DATA:  Umbilical pain and lower back pain. EXAM: CT CHEST, ABDOMEN AND PELVIS WITHOUT CONTRAST TECHNIQUE: Multidetector CT imaging of the chest, abdomen and pelvis was performed following the standard protocol without IV contrast. COMPARISON:  None. FINDINGS: CT CHEST FINDINGS Cardiovascular: There is mild calcification of the aortic arch. Normal heart size. No pericardial effusion. Mediastinum/Nodes: No enlarged mediastinal, hilar, or axillary lymph nodes. Thyroid gland, trachea, and esophagus demonstrate no significant findings. Lungs/Pleura: Very mild atelectasis is seen within the left upper lobe and right lower lobe. There is no evidence of acute infiltrate, pleural effusion or pneumothorax. Musculoskeletal: Marked severity degenerative changes seen throughout the thoracic spine with a chronic deformity seen involving the T10 vertebral body.  CT ABDOMEN PELVIS FINDINGS Hepatobiliary: No focal liver abnormality is seen. No gallstones, gallbladder wall thickening, or biliary dilatation. Pancreas: Unremarkable. No pancreatic ductal dilatation or surrounding inflammatory changes. Spleen: Normal in size without focal abnormality. Adrenals/Urinary Tract: Adrenal glands are unremarkable. Kidneys are normal in size without renal calculi or hydronephrosis. A 1.7 cm cyst is seen along the lateral aspect of the mid left kidney. Bladder is unremarkable. Stomach/Bowel: Stomach is within normal limits. Surgical clips are seen along the esophageal hiatus. Appendix appears normal. No evidence of bowel dilatation. Mild thickening of the distal descending colon is seen with a mild amount of pericolonic inflammatory fat stranding. Noninflamed diverticula are seen throughout the large bowel. Vascular/Lymphatic: No significant vascular findings are present. No enlarged abdominal or pelvic lymph nodes. Reproductive: Prostate is unremarkable. Other: There is a 3.6 cm x 3.8 cm fat containing umbilical hernia. Musculoskeletal: Moderate to marked severity multilevel degenerative changes seen throughout the lumbar spine. IMPRESSION: 1. Mild distal descending colon diverticulitis. 2. Noninflamed diverticula throughout the remainder of the large bowel. 3. 3.6 cm x 3.8 cm fat containing umbilical hernia. 4. Marked severity degenerative changes throughout the thoracic and lumbar spine with a chronic deformity involving the T10 vertebral body. 5. Aortic atherosclerosis. Aortic Atherosclerosis (ICD10-I70.0). Electronically Signed   By: Virgina Norfolk M.D.   On: 04/29/2020 17:47   US RENAL  Result Date: 04/29/2020 CLINICAL DATA:  Acute kidney injury. EXAM: RENAL / URINARY TRACT ULTRASOUND COMPLETE COMPARISON:  Abdomen/pelvis CT 04/29/2020 renal ultrasound exam 02/25/2015. FINDINGS: Right Kidney: Renal measurements: 8.9 x 5.5 x 3.8 cm = volume: 98 mL. Diffusely increased cortical  echogenicity without hydronephrosis. 2.6 cm hypoechoic lesion towards the upper pole is likely a cyst and is not substantially changed since previous ultrasound of 02/25/2015 consistent with benign etiology. Left Kidney: Renal measurements: 11.2 x 6.0 x 3.4 cm = volume: 120 mL. Diffusely increased echogenicity without hydronephrosis. 1.7 cm exophytic cyst noted towards the lower pole. A second 1.7 cm cyst is identified in the interpolar region. Bladder: Appears normal for degree of bladder distention. Other: None. IMPRESSION: 1. No evidence for hydronephrosis. 2. Diffusely increased echogenicity of both kidneys suggesting medical renal disease. Electronically Signed   By: Misty Stanley M.D.   On: 04/29/2020 20:20    Scheduled Meds: . allopurinol  300 mg Oral Daily  . carvedilol  25 mg Oral q AM  . cinacalcet  30 mg Oral Q breakfast  . feeding supplement (ENSURE ENLIVE)  237 mL Oral Q24H  . heparin  5,000 Units Subcutaneous Q12H  . sodium chloride flush  3 mL Intravenous Once    Continuous Infusions: . sodium chloride 125 mL/hr at 04/30/20 0320  . cefTRIAXone (ROCEPHIN)  IV    .  metronidazole 500 mg (04/30/20 1314)     LOS: 1 day     Kayleen Memos, MD [facility] Pager [phone]  If 7PM-7AM, please contact night-coverage [email or web address] Password [facility] 04/30/2020, 3:03 PM

## 2020-05-01 ENCOUNTER — Inpatient Hospital Stay (HOSPITAL_COMMUNITY)
Admission: RE | Admit: 2020-05-01 | Discharge: 2020-05-01 | Disposition: A | Discharge status: Home / Self Care | Payer: 59 | Source: Ambulatory Visit | Attending: Nephrology | Admitting: Nephrology

## 2020-05-01 LAB — BASIC METABOLIC PANEL
Anion gap: 11 (ref 5–15)
BUN: 80 mg/dL — ABNORMAL HIGH (ref 6–20)
CO2: 20 mmol/L — ABNORMAL LOW (ref 22–32)
Calcium: 10.6 mg/dL — ABNORMAL HIGH (ref 8.9–10.3)
Chloride: 110 mmol/L (ref 98–111)
Creatinine, Ser: 4.48 mg/dL — ABNORMAL HIGH (ref 0.61–1.24)
GFR calc Af Amer: 15 mL/min — ABNORMAL LOW (ref >60)
GFR calc non Af Amer: 13 mL/min — ABNORMAL LOW (ref >60)
Glucose, Bld: 91 mg/dL (ref 70–99)
Potassium: 4 mmol/L (ref 3.5–5.1)
Sodium: 141 mmol/L (ref 135–145)

## 2020-05-01 LAB — PHOSPHORUS: Phosphorus: 5.7 mg/dL — ABNORMAL HIGH (ref 2.5–4.6)

## 2020-05-01 LAB — CBC
HCT: 24.5 % — ABNORMAL LOW (ref 39.0–52.0)
Hemoglobin: 7.5 g/dL — ABNORMAL LOW (ref 13.0–17.0)
MCH: 32.9 pg (ref 26.0–34.0)
MCHC: 30.6 g/dL (ref 30.0–36.0)
MCV: 107.5 fL — ABNORMAL HIGH (ref 80.0–100.0)
Platelets: 187 10*3/uL (ref 150–400)
RBC: 2.28 MIL/uL — ABNORMAL LOW (ref 4.22–5.81)
RDW: 14.6 % (ref 11.5–15.5)
WBC: 8.3 10*3/uL (ref 4.0–10.5)
nRBC: 0 % (ref 0.0–0.2)

## 2020-05-01 MED ORDER — CAMPHOR-MENTHOL 0.5-0.5 % EX LOTN
TOPICAL_LOTION | CUTANEOUS | Status: AC | PRN
  Filled 2020-05-01: qty 222

## 2020-05-01 MED ORDER — SODIUM BICARBONATE 650 MG PO TABS
650.0000 mg | ORAL_TABLET | Freq: Two times a day (BID) | ORAL | Status: AC
  Administered 2020-05-01 (×2): 650 mg via ORAL
  Filled 2020-05-01 (×2): qty 1

## 2020-05-01 MED ORDER — METRONIDAZOLE 500 MG PO TABS
500.0000 mg | ORAL_TABLET | Freq: Three times a day (TID) | ORAL | Status: DC
  Administered 2020-05-01 – 2020-05-03 (×6): 500 mg via ORAL
  Filled 2020-05-01 (×9): qty 1

## 2020-05-01 NOTE — Progress Notes (Signed)
PROGRESS NOTE  Mark [name] Dec MPN:[id number] DOB: 1961-06-18 DOA: 04/29/2020 PCP: Delrae Rend, MD  HPI/Recap of past 24 hours: Mark [name] is a 59 y.o. male with medical history significant of HTN, CKD stage IV, Gout, gastric ulcer, presented with new onset abdominal pain diarrhea for 4 days.  He describes abdominal pain as constant and gradually getting worse located in the lower abdomen, aching like was 5-6 over 10 now is 8/10, and also has had 1-2 loose bowel movement every day and sometimes watery denied any blood or mucous in the stool.  Denies any fever chills.  He feels nauseous and in most small bite of food will trigger abdominal pain worse so he has not been eating and drinking very much for last 3 to 4 days.  He felt very dehydrated. ED Course: CT abdomen showed acute diverticulitis, WBC 8.8, creatinine 5.2 compared to 3.2 in May this year.  Review of Systems: As per HPI otherwise 10 point review of systems negative.   04/30/20: Seen and examined at bedside.  Nausea and abdominal pain are still present and slowly improving.  Currently on Rocephin and IV Flagyl for distal descending colon diverticulitis.  Appears weak, uses a cane at baseline.  PT consulted to evaluate.  Fall precautions in place.  Assessment/Plan: Active Problems:   Diverticula of colon   Acute renal failure superimposed on chronic kidney disease (HCC)  Acute distal descending colon diverticulitis Seen on CT scan Continue Rocephin and IV Flagyl Continue diet as tolerated IV antiemetics and pain control in place Will need to follow-up with GI for possible colonoscopy 4 to 6 weeks  Improving AKI on CKD stage IV likely prerenal in the setting of dehydration from ongoing diarrhea and poor oral intake. Creatinine downtrending 4.4 with GFR of 15. -Renal ultrasound 04/29/2020 shows medical disease.  No evidence of hydronephrosis Continue to avoid nephrotoxins and hypotension Continue to monitor urine  output.  Acute blood loss in the setting of advanced CKD and iron deficiency No overt bleeding Hemoglobin dropped to 7.5 from 8.2 K Latest iron studies suggestive of iron deficiency Feraheme ordered to be transfused.  Hyperphosphatemia in the setting of advanced CKD Levels are trending down from 6.9 to 5.7. If significantly elevated tomorrow we will treat.  Anion gap metabolic acidosis likely secondary to acute renal insufficiency Presented with serum bicarb of 18 and anion gap of 17 Serum bicarb 20 and anion gap 11 Give 2 doses of sodium bicarbonate. Repeat BMP in the morning  Essential HTN -Continue Coreg, hold Lasix  BP is at goal Continue to monitor vital signs  Umbilical hernia -Not the source for abdominal pain -Continue to monitor  Ambulatory dysfunction Uses a cane at baseline Continue PT OT with assistance and fall precautions  DVT prophylaxis: Heparin subcu 3 times daily Code Status: Full Code Family Communication: none at bedside  Consults called: Nephro   Status is: Inpatient  Dispo: The patient is from: Home.               Anticipated d/c is to: Home.              Anticipated d/c date is: 05/02/2020              Patient currently not appropriate for discharge at this time due to ongoing treatment with IV antibiotics for acute diverticulitis.         Objective: Vitals:   04/30/20 1738 05/01/20 0100 05/01/20 0547 05/01/20 0820  BP: 116/72 125/78  128/76 112/73  Pulse: 94 91 91 88  Resp: 18 18 18 18   Temp: 98.2 F (36.8 C) 98.8 F (37.1 C) 98.6 F (37 C) 98.6 F (37 C)  TempSrc: Oral Oral Oral Oral  SpO2: 99% 100% 100% 100%  Weight:      Height:        Intake/Output Summary (Last 24 hours) at 05/01/2020 1332 Last data filed at 05/01/2020 0300 Gross per 24 hour  Intake 440 ml  Output 800 ml  Net -360 ml   Filed Weights   04/29/20 0822 04/30/20 0221  Weight: 116.6 kg 115.2 kg    Exam:  . General: 59 y.o. year-old male  well-developed well-nourished in no distress.  Alert and oriented x3.   . Cardiovascular: Regular rate and rhythm no rubs or gallops.   Marland Kitchen Respiratory: Clear to auscultation no wheezes or rales. . Abdomen: Soft left lower quadrant abdominal tenderness with palpation.  Normal bowel sounds x4 quadrants. . Musculoskeletal: No lower extremity edema bilaterally. Marland Kitchen Psychiatry: Mood is appropriate for condition .  Data Reviewed: CBC: Recent Labs  Lab 04/29/20 0826 04/30/20 0431 05/01/20 0453  WBC 8.5 7.4 8.3  HGB 8.8* 8.2* 7.5*  HCT 27.9* 26.1* 24.5*  MCV 105.7* 105.7* 107.5*  PLT 212 200 673   Basic Metabolic Panel: Recent Labs  Lab 04/29/20 0826 04/30/20 0431 05/01/20 0453  NA 137 140 141  K 3.9 3.9 4.0  CL 102 105 110  CO2 18* 20* 20*  GLUCOSE 108* 84 91  BUN 92* 89* 80*  CREATININE 5.24* 4.73* 4.48*  CALCIUM 11.3* 10.9* 10.6*  PHOS  --  6.9* 5.7*   GFR: Estimated Creatinine Clearance: 21.9 mL/min (A) (by C-G formula based on SCr of 4.48 mg/dL (H)). Liver Function Tests: Recent Labs  Lab 04/29/20 0826  AST 20  ALT 14  ALKPHOS 61  BILITOT 0.8  PROT 8.5*  ALBUMIN 3.1*   Recent Labs  Lab 04/29/20 0826  LIPASE 45   No results for input(s): AMMONIA in the last 168 hours. Coagulation Profile: No results for input(s): INR, PROTIME in the last 168 hours. Cardiac Enzymes: No results for input(s): CKTOTAL, CKMB, CKMBINDEX, TROPONINI in the last 168 hours. BNP (last 3 results) No results for input(s): PROBNP in the last 8760 hours. HbA1C: No results for input(s): HGBA1C in the last 72 hours. CBG: No results for input(s): GLUCAP in the last 168 hours. Lipid Profile: No results for input(s): CHOL, HDL, LDLCALC, TRIG, CHOLHDL, LDLDIRECT in the last 72 hours. Thyroid Function Tests: No results for input(s): TSH, T4TOTAL, FREET4, T3FREE, THYROIDAB in the last 72 hours. Anemia Panel: Recent Labs    04/29/20 2156  FERRITIN 743*  TIBC 193*  IRON 21*   Urine  analysis:    Component Value Date/Time   COLORURINE YELLOW 04/29/2020 2350   APPEARANCEUR CLEAR 04/29/2020 2350   LABSPEC 1.009 04/29/2020 2350   PHURINE 5.0 04/29/2020 2350   GLUCOSEU NEGATIVE 04/29/2020 2350   HGBUR NEGATIVE 04/29/2020 2350   BILIRUBINUR NEGATIVE 04/29/2020 2350   KETONESUR NEGATIVE 04/29/2020 2350   PROTEINUR NEGATIVE 04/29/2020 2350   UROBILINOGEN 0.2 09/27/2011 1317   NITRITE NEGATIVE 04/29/2020 2350   LEUKOCYTESUR NEGATIVE 04/29/2020 2350   Sepsis Labs: @LABRCNTIP (procalcitonin:4,lacticidven:4)  ) Recent Results (from the past 240 hour(s))  SARS Coronavirus 2 by RT PCR (hospital order, performed in [facility] hospital lab) Nasopharyngeal Nasopharyngeal Swab     Status: None   Collection Time: 04/29/20  4:55 PM   Specimen: Nasopharyngeal  Swab  Result Value Ref Range Status   SARS Coronavirus 2 NEGATIVE NEGATIVE Final    Comment: (NOTE) SARS-CoV-2 target nucleic acids are NOT DETECTED.  The SARS-CoV-2 RNA is generally detectable in upper and lower respiratory specimens during the acute phase of infection. The lowest concentration of SARS-CoV-2 viral copies this assay can detect is 250 copies / mL. A negative result does not preclude SARS-CoV-2 infection and should not be used as the sole basis for treatment or other patient management decisions.  A negative result may occur with improper specimen collection / handling, submission of specimen other than nasopharyngeal swab, presence of viral mutation(s) within the areas targeted by this assay, and inadequate number of viral copies (<250 copies / mL). A negative result must be combined with clinical observations, patient history, and epidemiological information.  Fact Sheet for Patients:   StrictlyIdeas.no  Fact Sheet for Healthcare Providers: BankingDealers.co.za  This test is not yet approved or  cleared by the [place] FDA and has been authorized  for detection and/or diagnosis of SARS-CoV-2 by FDA under an Emergency Use Authorization (EUA).  This EUA will remain in effect (meaning this test can be used) for the duration of the COVID-19 declaration under Section 564(b)(1) of the Act, 21 U.S.C. section 360bbb-3(b)(1), unless the authorization is terminated or revoked sooner.  Performed at [facility], [place] [address], [place], [place] [address]       Studies: No results found.  Scheduled Meds: . allopurinol  300 mg Oral Daily  . carvedilol  25 mg Oral q AM  . cinacalcet  30 mg Oral Q breakfast  . feeding supplement (ENSURE ENLIVE)  237 mL Oral Q24H  . ferrous sulfate  325 mg Oral Q breakfast  . heparin  5,000 Units Subcutaneous Q12H  . metroNIDAZOLE  500 mg Oral Q8H    Continuous Infusions: . cefTRIAXone (ROCEPHIN)  IV 2 g (04/30/20 1915)  . ferumoxytol       LOS: 2 days     Kayleen Memos, MD [facility] Pager [phone]  If 7PM-7AM, please contact night-coverage [email or web address] Password [email or web address] 05/01/2020, 1:32 PM

## 2020-05-02 LAB — CBC
HCT: 24.9 % — ABNORMAL LOW (ref 39.0–52.0)
Hemoglobin: 7.6 g/dL — ABNORMAL LOW (ref 13.0–17.0)
MCH: 32.9 pg (ref 26.0–34.0)
MCHC: 30.5 g/dL (ref 30.0–36.0)
MCV: 107.8 fL — ABNORMAL HIGH (ref 80.0–100.0)
Platelets: 211 10*3/uL (ref 150–400)
RBC: 2.31 MIL/uL — ABNORMAL LOW (ref 4.22–5.81)
RDW: 14.7 % (ref 11.5–15.5)
WBC: 7.8 10*3/uL (ref 4.0–10.5)
nRBC: 0 % (ref 0.0–0.2)

## 2020-05-02 LAB — BASIC METABOLIC PANEL
Anion gap: 13 (ref 5–15)
BUN: 74 mg/dL — ABNORMAL HIGH (ref 6–20)
CO2: 18 mmol/L — ABNORMAL LOW (ref 22–32)
Calcium: 10.5 mg/dL — ABNORMAL HIGH (ref 8.9–10.3)
Chloride: 109 mmol/L (ref 98–111)
Creatinine, Ser: 4.39 mg/dL — ABNORMAL HIGH (ref 0.61–1.24)
GFR calc Af Amer: 16 mL/min — ABNORMAL LOW (ref >60)
GFR calc non Af Amer: 14 mL/min — ABNORMAL LOW (ref >60)
Glucose, Bld: 77 mg/dL (ref 70–99)
Potassium: 3.9 mmol/L (ref 3.5–5.1)
Sodium: 140 mmol/L (ref 135–145)

## 2020-05-02 LAB — PHOSPHORUS: Phosphorus: 5.2 mg/dL — ABNORMAL HIGH (ref 2.5–4.6)

## 2020-05-02 MED ORDER — CEPHALEXIN 500 MG PO CAPS
500.0000 mg | ORAL_CAPSULE | Freq: Two times a day (BID) | ORAL | Status: DC
  Administered 2020-05-02 – 2020-05-03 (×3): 500 mg via ORAL
  Filled 2020-05-02 (×4): qty 1

## 2020-05-02 MED ORDER — METRONIDAZOLE 500 MG PO TABS: 500.0000 mg | ORAL_TABLET | Freq: Two times a day (BID) | ORAL | Status: DC

## 2020-05-02 MED ORDER — SACCHAROMYCES BOULARDII 250 MG PO CAPS
250.0000 mg | ORAL_CAPSULE | Freq: Two times a day (BID) | ORAL | Status: DC
  Administered 2020-05-02 – 2020-05-03 (×3): 250 mg via ORAL
  Filled 2020-05-02 (×4): qty 1

## 2020-05-02 NOTE — Progress Notes (Signed)
PROGRESS NOTE  Mark [name] EHU:[id number] DOB: 01/26/1961 DOA: 04/29/2020 PCP: Delrae Rend, MD  HPI/Recap of past 24 hours: Mark [name] is a 59 y.o. male with medical history significant of HTN, CKD stage IV, Gout, gastric ulcer, presented with new onset abdominal pain diarrhea for 4 days.  He describes abdominal pain as constant and gradually getting worse located in the lower abdomen, aching like was 5-6 over 10 now is 8/10, and also has had 1-2 loose bowel movement every day and sometimes watery denied any blood or mucous in the stool.  Denies any fever chills.  He feels nauseous and in most small bite of food will trigger abdominal pain worse so he has not been eating and drinking very much for last 3 to 4 days.  He felt very dehydrated. ED Course: CT abdomen showed acute diverticulitis, WBC 8.8, creatinine 5.2 compared to 3.2 in May this year.  Received 2 days of Rocephin and IV Flagyl for distal descending colon diverticulitis.   05/02/20: Seen and examined at bedside.  No acute events overnight.  Nausea and abdominal pain continue to improve.  We will switch to oral antibiotics and plan for discharge likely tomorrow.   Assessment/Plan: Active Problems:   Diverticula of colon   Acute renal failure superimposed on chronic kidney disease (HCC)  Acute distal descending colon diverticulitis Seen on CT scan Received 2 days of IV Rocephin and IV Flagyl Switched to p.o. Keflex 500 mg 3 times daily and p.o. Flagyl 500 mg 3 times daily on 6/25/2021x 10 days. Continue diet as tolerated IV antiemetics and pain control in place as needed Will need to follow-up with GI for possible colonoscopy 4 to 6 weeks  Improving AKI on CKD stage IV likely prerenal in the setting of dehydration from ongoing diarrhea and poor oral intake. Creatinine downtrending 4.39 from 4.4 with GFR of 15. -Renal ultrasound 04/29/2020 shows medical disease.  No evidence of hydronephrosis Continue to avoid  nephrotoxins and hypotension Continue to monitor urine output.  Acute blood loss in the setting of advanced CKD and iron deficiency No overt bleeding Hemoglobin 7.6 from 7.5 from 8.2 K Latest iron studies suggestive of iron deficiency Feraheme transfused 05/01/20  Hyperphosphatemia in the setting of advanced CKD Levels are trending down from 6.9 to 5.7 >5.2 If significantly elevated tomorrow we will treat.  Anion gap metabolic acidosis likely secondary to acute renal insufficiency Presented with serum bicarb of 18 and anion gap of 17 Give 2 doses of sodium bicarbonate, repeat Repeat BMP in the morning  Essential HTN -Continue Coreg, hold Lasix  BP is at goal Continue to monitor vital signs  Umbilical hernia -Not the source for abdominal pain -Continue to monitor  Ambulatory dysfunction Uses a cane at baseline Continue PT OT with assistance and fall precautions  DVT prophylaxis: Heparin subcu 3 times daily Code Status: Full Code Family Communication: none at bedside  Consults called: Nephro   Status is: Inpatient  Dispo: The patient is from: Home.               Anticipated d/c is to: Home.              Anticipated d/c date is: 05/03/2020              Patient currently not appropriate for discharge at this time due to ongoing treatment with IV antibiotics for acute diverticulitis.         Objective: Vitals:   05/01/20 1600 05/02/20 0044  05/02/20 0603 05/02/20 1232  BP: 126/80 131/83 130/85 112/81  Pulse: 80 89 91 81  Resp: 18 16 16 16   Temp: 98.3 F (36.8 C) 99.1 F (37.3 C) 98.7 F (37.1 C) 98 F (36.7 C)  TempSrc: Oral Oral Oral Oral  SpO2: 100% 96% 97% 100%  Weight:      Height:        Intake/Output Summary (Last 24 hours) at 05/02/2020 1435 Last data filed at 05/02/2020 0600 Gross per 24 hour  Intake --  Output 350 ml  Net -350 ml   Filed Weights   04/29/20 0822 04/30/20 0221  Weight: 116.6 kg 115.2 kg    Exam: No significant changes  from previous exam done on 05/01/20  . General: 60 y.o. year-old male well-developed well-nourished in no distress.  Alert and oriented x3.   . Cardiovascular: Regular rate and rhythm no rubs or gallops.   Marland Kitchen Respiratory: Clear to auscultation no wheezes or rales. . Abdomen: Soft left lower quadrant abdominal tenderness with palpation.  Normal bowel sounds x4 quadrants. . Musculoskeletal: No lower extremity edema bilaterally. Marland Kitchen Psychiatry: Mood is appropriate for condition .  Data Reviewed: CBC: Recent Labs  Lab 04/29/20 0826 04/30/20 0431 05/01/20 0453 05/02/20 0538  WBC 8.5 7.4 8.3 7.8  HGB 8.8* 8.2* 7.5* 7.6*  HCT 27.9* 26.1* 24.5* 24.9*  MCV 105.7* 105.7* 107.5* 107.8*  PLT 212 200 187 226   Basic Metabolic Panel: Recent Labs  Lab 04/29/20 0826 04/30/20 0431 05/01/20 0453 05/02/20 0538  NA 137 140 141 140  K 3.9 3.9 4.0 3.9  CL 102 105 110 109  CO2 18* 20* 20* 18*  GLUCOSE 108* 84 91 77  BUN 92* 89* 80* 74*  CREATININE 5.24* 4.73* 4.48* 4.39*  CALCIUM 11.3* 10.9* 10.6* 10.5*  PHOS  --  6.9* 5.7* 5.2*   GFR: Estimated Creatinine Clearance: 22.4 mL/min (A) (by C-G formula based on SCr of 4.39 mg/dL (H)). Liver Function Tests: Recent Labs  Lab 04/29/20 0826  AST 20  ALT 14  ALKPHOS 61  BILITOT 0.8  PROT 8.5*  ALBUMIN 3.1*   Recent Labs  Lab 04/29/20 0826  LIPASE 45   No results for input(s): AMMONIA in the last 168 hours. Coagulation Profile: No results for input(s): INR, PROTIME in the last 168 hours. Cardiac Enzymes: No results for input(s): CKTOTAL, CKMB, CKMBINDEX, TROPONINI in the last 168 hours. BNP (last 3 results) No results for input(s): PROBNP in the last 8760 hours. HbA1C: No results for input(s): HGBA1C in the last 72 hours. CBG: No results for input(s): GLUCAP in the last 168 hours. Lipid Profile: No results for input(s): CHOL, HDL, LDLCALC, TRIG, CHOLHDL, LDLDIRECT in the last 72 hours. Thyroid Function Tests: No results for  input(s): TSH, T4TOTAL, FREET4, T3FREE, THYROIDAB in the last 72 hours. Anemia Panel: Recent Labs    04/29/20 2156  FERRITIN 743*  TIBC 193*  IRON 21*   Urine analysis:    Component Value Date/Time   COLORURINE YELLOW 04/29/2020 2350   APPEARANCEUR CLEAR 04/29/2020 2350   LABSPEC 1.009 04/29/2020 2350   PHURINE 5.0 04/29/2020 2350   GLUCOSEU NEGATIVE 04/29/2020 2350   HGBUR NEGATIVE 04/29/2020 2350   BILIRUBINUR NEGATIVE 04/29/2020 2350   KETONESUR NEGATIVE 04/29/2020 2350   PROTEINUR NEGATIVE 04/29/2020 2350   UROBILINOGEN 0.2 09/27/2011 1317   NITRITE NEGATIVE 04/29/2020 2350   LEUKOCYTESUR NEGATIVE 04/29/2020 2350   Sepsis Labs: @LABRCNTIP (procalcitonin:4,lacticidven:4)  ) Recent Results (from the past 240 hour(s))  SARS  Coronavirus 2 by RT PCR (hospital order, performed in [facility] hospital lab) Nasopharyngeal Nasopharyngeal Swab     Status: None   Collection Time: 04/29/20  4:55 PM   Specimen: Nasopharyngeal Swab  Result Value Ref Range Status   SARS Coronavirus 2 NEGATIVE NEGATIVE Final    Comment: (NOTE) SARS-CoV-2 target nucleic acids are NOT DETECTED.  The SARS-CoV-2 RNA is generally detectable in upper and lower respiratory specimens during the acute phase of infection. The lowest concentration of SARS-CoV-2 viral copies this assay can detect is 250 copies / mL. A negative result does not preclude SARS-CoV-2 infection and should not be used as the sole basis for treatment or other patient management decisions.  A negative result may occur with improper specimen collection / handling, submission of specimen other than nasopharyngeal swab, presence of viral mutation(s) within the areas targeted by this assay, and inadequate number of viral copies (<250 copies / mL). A negative result must be combined with clinical observations, patient history, and epidemiological information.  Fact Sheet for Patients:   StrictlyIdeas.no  Fact  Sheet for Healthcare Providers: BankingDealers.co.za  This test is not yet approved or  cleared by the [place] FDA and has been authorized for detection and/or diagnosis of SARS-CoV-2 by FDA under an Emergency Use Authorization (EUA).  This EUA will remain in effect (meaning this test can be used) for the duration of the COVID-19 declaration under Section 564(b)(1) of the Act, 21 U.S.C. section 360bbb-3(b)(1), unless the authorization is terminated or revoked sooner.  Performed at [facility], [place] [address], [place],  [address]       Studies: No results found.  Scheduled Meds: . allopurinol  300 mg Oral Daily  . carvedilol  25 mg Oral q AM  . cephALEXin  500 mg Oral Q12H  . cinacalcet  30 mg Oral Q breakfast  . feeding supplement (ENSURE ENLIVE)  237 mL Oral Q24H  . ferrous sulfate  325 mg Oral Q breakfast  . heparin  5,000 Units Subcutaneous Q12H  . metroNIDAZOLE  500 mg Oral Q8H  . saccharomyces boulardii  250 mg Oral BID    Continuous Infusions:    LOS: 3 days     Kayleen Memos, MD [facility] Pager [phone]  If 7PM-7AM, please contact night-coverage [email or web address] Password [facility] 05/02/2020, 2:35 PM

## 2020-05-02 NOTE — Evaluation (Signed)
Physical Therapy Evaluation Patient Details Name: Mark [name] MRN: [id number] DOB: [birth year] Today's Date: 05/02/2020   History of Present Illness  Pt is a 59 yo male presenting with abdominal pain and diarrhea x4 days PTA. Pt found to have diverticula of distal colon and AKI on CKD. PMH includes: CKD IV, HTN, osteoporosis, current tobacco use, and arthritis.  Clinical Impression  Pt in bed upon arrival of PT, agreeable to evaluation at this time. Prior to admission the pt was independent with use of quad-cane for stairs and longer community ambulation, but otherwise independent with mobility and ADLs. The pt now presents with minor limitations in functional mobility, strength, and stability due to above dx, but is safe to return home without follow-up PT as he is at his baseline for mobility and reports his brother lives nearby and can provide some supervision as needed for a few days following d/c. The pt was able to demo good independence with transfers in his room with use of cane and without AD, as well as good safety with hallway ambulation and stair navigation using quad-cane. Education regarding AD use and endurance training was provided and pt was agreeable. The pt has no further acute PT needs at this time, thank you for the consult and feel free to re-consult if change in status.       Follow Up Recommendations No PT follow up;Supervision - Intermittent    Equipment Recommendations  None recommended by PT (pt has needed equipment)    Recommendations for Other Services       Precautions / Restrictions Precautions Precautions: None Restrictions Weight Bearing Restrictions: No      Mobility  Bed Mobility Overal bed mobility: Independent             General bed mobility comments: pt up in bathroom upon arrival, able to demo bed mobility without assist  Transfers Overall transfer level: Independent Equipment used: None             General transfer comment:  pt up in bathroom upon arrival, able to demo multiple sit-stand trasnfers both with use of his quad-cane from home and without AD.  Ambulation/Gait Ambulation/Gait assistance: Supervision Gait Distance (Feet): 150 Feet Assistive device: None Gait Pattern/deviations: Step-through pattern;Decreased stride length;Wide base of support Gait velocity: 0.7 m/s Gait velocity interpretation: 1.31 - 2.62 ft/sec, indicative of limited community ambulator General Gait Details: pt with wide BOS and minimal clearance but no LOB and good gait speed. The pt does not use his cane for short distances (he carries it) but started to use it with prolonged ambulation and navigation of stairs.  Stairs Stairs: Yes Stairs assistance: Supervision Stair Management: No rails;With cane Number of Stairs: 3 General stair comments: pt completed x3 with rail and cane and x3 with cane only. supervision for safety but no LOB        Balance Overall balance assessment: Independent                                           Pertinent Vitals/Pain Pain Assessment: No/denies pain    Home Living Family/patient expects to be discharged to:: Private residence Living Arrangements: Alone   Type of Home: House Home Access: Stairs to enter Entrance Stairs-Rails: None (currently no rail, is arranging one to be installed. uses cane to navigate) Entrance Stairs-Number of Steps: 3 Home Layout: One level Home Equipment:  Grab bars - tub/shower;Shower seat;Cane - quad;Cane - single point;Walker - 2 wheels Additional Comments: pt reports not using shower seat but has it    Prior Function Level of Independence: Independent         Comments: pt reports independence with all ADLs and IADLs, still driving. pt reports use of cane for stairs and long distance community ambulation     Hand Dominance   Dominant Hand: Right    Extremity/Trunk Assessment   Upper Extremity Assessment Upper Extremity  Assessment: Overall WFL for tasks assessed    Lower Extremity Assessment Lower Extremity Assessment: Overall WFL for tasks assessed    Cervical / Trunk Assessment Cervical / Trunk Assessment: Normal  Communication   Communication: No difficulties  Cognition Arousal/Alertness: Awake/alert Behavior During Therapy: WFL for tasks assessed/performed Overall Cognitive Status: Within Functional Limits for tasks assessed                                        General Comments General comments (skin integrity, edema, etc.): pt able to navigate in room without assist, good management of IV pole. VSS throughout, HR in 90s SpO2 98-99%        Assessment/Plan    PT Assessment Patent does not need any further PT services         PT Goals (Current goals can be found in the Care Plan section)  Acute Rehab PT Goals Patient Stated Goal: return home PT Goal Formulation: With patient Time For Goal Achievement: 05/16/20 Potential to Achieve Goals: Good     AM-PAC PT "6 Clicks" Mobility  Outcome Measure Help needed turning from your back to your side while in a flat bed without using bedrails?: None Help needed moving from lying on your back to sitting on the side of a flat bed without using bedrails?: None Help needed moving to and from a bed to a chair (including a wheelchair)?: None Help needed standing up from a chair using your arms (e.g., wheelchair or bedside chair)?: None Help needed to walk in hospital room?: None Help needed climbing 3-5 steps with a railing? : A Little 6 Click Score: 23    End of Session Equipment Utilized During Treatment: Gait belt Activity Tolerance: Patient tolerated treatment well Patient left: in bed;with call bell/phone within reach (sitting EOB) Nurse Communication: Mobility status PT Visit Diagnosis: Difficulty in walking, not elsewhere classified (R26.2)    Time: 2423-5361 PT Time Calculation (min) (ACUTE ONLY): 29  min   Charges:   PT Evaluation $PT Eval Low Complexity: 1 Low PT Treatments $Gait Training: 8-22 mins        Karma Ganja, PT, DPT   Acute Rehabilitation Department Pager #: [phone]  Otho Bellows 05/02/2020, 9:49 AM

## 2020-05-03 LAB — BASIC METABOLIC PANEL
Anion gap: 13 (ref 5–15)
BUN: 64 mg/dL — ABNORMAL HIGH (ref 6–20)
CO2: 19 mmol/L — ABNORMAL LOW (ref 22–32)
Calcium: 10.4 mg/dL — ABNORMAL HIGH (ref 8.9–10.3)
Chloride: 108 mmol/L (ref 98–111)
Creatinine, Ser: 4.34 mg/dL — ABNORMAL HIGH (ref 0.61–1.24)
GFR calc Af Amer: 16 mL/min — ABNORMAL LOW (ref >60)
GFR calc non Af Amer: 14 mL/min — ABNORMAL LOW (ref >60)
Glucose, Bld: 125 mg/dL — ABNORMAL HIGH (ref 70–99)
Potassium: 4 mmol/L (ref 3.5–5.1)
Sodium: 140 mmol/L (ref 135–145)

## 2020-05-03 LAB — CBC
HCT: 25.2 % — ABNORMAL LOW (ref 39.0–52.0)
Hemoglobin: 7.7 g/dL — ABNORMAL LOW (ref 13.0–17.0)
MCH: 32.9 pg (ref 26.0–34.0)
MCHC: 30.6 g/dL (ref 30.0–36.0)
MCV: 107.7 fL — ABNORMAL HIGH (ref 80.0–100.0)
Platelets: 214 10*3/uL (ref 150–400)
RBC: 2.34 MIL/uL — ABNORMAL LOW (ref 4.22–5.81)
RDW: 14.7 % (ref 11.5–15.5)
WBC: 7.6 10*3/uL (ref 4.0–10.5)
nRBC: 0 % (ref 0.0–0.2)

## 2020-05-03 MED ORDER — CEPHALEXIN 500 MG PO CAPS: 500.0000 mg | ORAL_CAPSULE | Freq: Two times a day (BID) | ORAL | 0 refills | Status: AC

## 2020-05-03 MED ORDER — FERROUS SULFATE 325 (65 FE) MG PO TABS: 325.0000 mg | ORAL_TABLET | Freq: Every day | ORAL | 0 refills | Status: DC

## 2020-05-03 MED ORDER — METRONIDAZOLE 500 MG PO TABS: 500.0000 mg | ORAL_TABLET | Freq: Three times a day (TID) | ORAL | 0 refills | Status: AC

## 2020-05-03 MED ORDER — CEPHALEXIN 500 MG PO CAPS: 500.0000 mg | ORAL_CAPSULE | Freq: Two times a day (BID) | ORAL | 0 refills | Status: DC

## 2020-05-03 MED ORDER — METRONIDAZOLE 500 MG PO TABS: 500.0000 mg | ORAL_TABLET | Freq: Three times a day (TID) | ORAL | 0 refills | Status: DC

## 2020-05-03 MED ORDER — SACCHAROMYCES BOULARDII 250 MG PO CAPS: 250.0000 mg | ORAL_CAPSULE | Freq: Two times a day (BID) | ORAL | 0 refills | Status: DC

## 2020-05-03 MED ORDER — SACCHAROMYCES BOULARDII 250 MG PO CAPS: 250.0000 mg | ORAL_CAPSULE | Freq: Two times a day (BID) | ORAL | 0 refills | Status: AC

## 2020-05-03 NOTE — Discharge Summary (Signed)
Discharge Summary  Mark [name] IZT:[id number] DOB: [birth year]  PCP: Delrae Rend, MD  Admit date: 04/29/2020 Discharge date: 05/03/2020  Time spent: 35 minutes  Recommendations for Outpatient Follow-up:  1. Follow-up with your primary care provider 2. Obtain GI referral from your primary care provider 3. Follow-up with nephrology 4. Take your medications as prescribed  Discharge Diagnoses:  Active Hospital Problems   Diagnosis Date Noted  . Diverticula of colon 04/29/2020  . Acute renal failure superimposed on chronic kidney disease ([place]) 04/29/2020    Resolved Hospital Problems  No resolved problems to display.    Discharge Condition: Stable  Diet recommendation: Low-fat diet.  Vitals:   05/02/20 2312 05/03/20 0637  BP: 127/85 139/88  Pulse: 80 79  Resp: 16 18  Temp: 98.2 F (36.8 C) 98 F (36.7 C)  SpO2: 97%     History of present illness:  Mark [name] a 59 y.o.malewith medical history significant ofHTN, CKD stage IV, Gout, gastric ulcer,presented with new onset abdominal pain diarrhea for 4 days. He describes abdominal pain as constant and gradually getting worse located in the lower abdomen, aching like was 5-6 over 10 now is 8/10,and also has had 1-2 loose bowel movement every day and sometimes watery denied any blood or mucous in the stool.Denies any fever chills. He feels nauseous and in most small bite of food will trigger abdominal pain worse so he has not been eating and drinking very much for last 3 to 4 days. He felt very dehydrated. ED Course:CT abdomen showed acute diverticulitis,WBC 8.8, creatinine 5.2 compared to 3.2 in May this year.  Received 2 days of Rocephin and IV Flagyl for distal descending colon diverticulitis and started on Keflex 500 mg twice daily and oral Flagyl 500 mg 3 times daily x10 days on 05/02/2020.   05/03/20: Seen and examined at bedside.  No acute events overnight.    He feels better this morning.  No  abdominal pain or nausea.  Tolerating diet well.  Eager to go home.  PT assessed with no further recommendations.  Hospital Course:  Active Problems:   Diverticula of colon   Acute renal failure superimposed on chronic kidney disease (HCC)  Improving, acute distal descending colon diverticulitis Seen on CT scan Received 2 days of IV Rocephin and IV Flagyl Switched to p.o. Keflex 500 mg twice daily and p.o. Flagyl 500 mg 3 times daily on 6/25/2021x 10 days. Continue low-fat diet Obtain GI referral from your PCP for possible colonoscopy in 4 to 6 weeks.  Improving AKI on CKD stage IV likely prerenal in the setting of dehydration from ongoing diarrhea and poor oral intake. Creatinine downtrending  4.34 on 05/03/20 from 4.39 from 4.4 from 4.73. -Renal ultrasound 04/29/2020 shows medical disease.  No evidence of hydronephrosis Continue to avoid nephrotoxins  Follow-up with your nephrologist.  Anemia of chronic disease in the setting of advanced CKD and iron deficiency anemia No overt bleeding Hemoglobin 7.7 on 05/03/20 from 7.6 from 7.5 from 8.2 K Latest iron studies suggestive of iron deficiency Feraheme transfused 05/01/20 Follow-up with nephrology  Hyperphosphatemia in the setting of advanced CKD Levels are trending down from 6.9 to 5.7 >5.2 Follow-up with nephrology  Improving anion gap metabolic acidosis likely secondary to advanced CKD Presented with serum bicarb of 18 and anion gap of 17 Received sodium bicarbonate Continue home regimen Follow-up with your PCP and nephrology  Essential HTN Blood pressure is at goal -Continue Coreg Continue to hold off Lasix due to AKI  Follow-up with nephrology and your PCP  Umbilical hernia -Follow-up with your PCP  Ambulatory dysfunction Uses a cane at baseline PT assessed with no further recommendations. Continue fall precautions   Code Status:Full Code   Consults called:Nephro    Discharge Exam: BP 139/88 (BP  Location: Right Arm)   Pulse 79   Temp 98 F (36.7 C) (Oral)   Resp 18   Ht 5\' 9"  (1.753 m)   Wt 115.2 kg   SpO2 97%   BMI 37.50 kg/m  . General: 59 y.o. year-old male well developed well nourished in no acute distress.  Alert and oriented x3. . Cardiovascular: Regular rate and rhythm with no rubs or gallops.  No thyromegaly or JVD noted.   Marland Kitchen Respiratory: Clear to auscultation with no wheezes or rales. Good inspiratory effort. . Abdomen: Soft nontender nondistended with normal bowel sounds x4 quadrants. . Musculoskeletal: No lower extremity edema. 2/4 pulses in all 4 extremities. Marland Kitchen Psychiatry: Mood is appropriate for condition and setting  Discharge Instructions You were cared for by a hospitalist during your hospital stay. If you have any questions about your discharge medications or the care you received while you were in the hospital after you are discharged, you can call the unit and asked to speak with the hospitalist on call if the hospitalist that took care of you is not available. Once you are discharged, your primary care physician will handle any further medical issues. Please note that NO REFILLS for any discharge medications will be authorized once you are discharged, as it is imperative that you return to your primary care physician (or establish a relationship with a primary care physician if you do not have one) for your aftercare needs so that they can reassess your need for medications and monitor your lab values.   Allergies as of 05/03/2020      Reactions   Penicillins Swelling, Rash, Other (See Comments)   Arms and eyes swell & skin breaks out  Has patient had a PCN reaction causing immediate rash, facial/tongue/throat swelling, SOB or lightheadedness with hypotension: Yes Has patient had a PCN reaction causing severe rash involving mucus membranes or skin necrosis: Yes Has patient had a PCN reaction that required hospitalization: No Has patient had a PCN reaction  occurring within the last 10 years: No If all of the above answers are "NO", then may proceed with Cephalosporin use.      Medication List    STOP taking these medications   docusate sodium 100 MG capsule Commonly known as: Colace   furosemide 40 MG tablet Commonly known as: LASIX   methocarbamol 500 MG tablet Commonly known as: Robaxin   oxyCODONE-acetaminophen 5-325 MG tablet Commonly known as: Roxicet     TAKE these medications   acetaminophen 325 MG tablet Commonly known as: TYLENOL Take 325-650 mg by mouth every 6 (six) hours as needed for mild pain or headache.   allopurinol 300 MG tablet Commonly known as: ZYLOPRIM Take 300 mg by mouth daily.   aspirin EC 81 MG tablet Take 162 mg by mouth as needed (for headaches). Swallow whole   carvedilol 25 MG tablet Commonly known as: COREG Take 25 mg by mouth in the morning.   cephALEXin 500 MG capsule Commonly known as: KEFLEX Take 1 capsule (500 mg total) by mouth every 12 (twelve) hours for 9 days.   ferrous sulfate 325 (65 FE) MG tablet Take 1 tablet (325 mg total) by mouth daily with breakfast.   loratadine  10 MG tablet Commonly known as: CLARITIN Take 10 mg by mouth daily as needed for allergies or rhinitis.   metroNIDAZOLE 500 MG tablet Commonly known as: FLAGYL Take 1 tablet (500 mg total) by mouth every 8 (eight) hours for 9 days.   One-A-Day Mens 50+ Tabs Take 1 tablet by mouth daily with breakfast.   saccharomyces boulardii 250 MG capsule Commonly known as: FLORASTOR Take 1 capsule (250 mg total) by mouth 2 (two) times daily for 9 days.   Sensipar 30 MG tablet Generic drug: cinacalcet Take 30 mg by mouth daily.   sodium bicarbonate 650 MG tablet Take 650 mg by mouth 3 (three) times daily.   tetrahydrozoline 0.05 % ophthalmic solution Place 1 drop into both eyes as needed (allergies and redness).      Allergies  Allergen Reactions  . Penicillins Swelling, Rash and Other (See Comments)     Arms and eyes swell & skin breaks out  Has patient had a PCN reaction causing immediate rash, facial/tongue/throat swelling, SOB or lightheadedness with hypotension: Yes Has patient had a PCN reaction causing severe rash involving mucus membranes or skin necrosis: Yes Has patient had a PCN reaction that required hospitalization: No Has patient had a PCN reaction occurring within the last 10 years: No If all of the above answers are "NO", then may proceed with Cephalosporin use.     Follow-up Information    Delrae Rend, MD. Call in 1 day(s).   Specialty: Endocrinology Why: please call for a post hospital follow up appointment Contact information: [address] [place] [address] [phone]        Elmarie Shiley, MD. Call in 1 day(s).   Specialty: Nephrology Why: please call for a post hospital follow up appointment Contact information: [place] [address] [phone]        Lorretta Harp, MD. Call in 1 day(s).   Specialties: Cardiology, Radiology Why: Please call for a post hospital follow up appointment Contact information: [address] [place] [address] [phone]                The results of significant diagnostics from this hospitalization (including imaging, microbiology, ancillary and laboratory) are listed below for reference.    Significant Diagnostic Studies: CT Abdomen Pelvis Wo Contrast  Result Date: 04/29/2020 CLINICAL DATA:  Abdominal pain and lower back pain. EXAM: CT CHEST, ABDOMEN AND PELVIS WITHOUT CONTRAST TECHNIQUE: Multidetector CT imaging of the chest, abdomen and pelvis was performed following the standard protocol without IV contrast. COMPARISON:  None. FINDINGS: CT CHEST FINDINGS Cardiovascular: There is mild calcification of the aortic arch. Normal heart size. No pericardial effusion. Mediastinum/Nodes: No enlarged mediastinal, hilar, or axillary lymph nodes. Thyroid gland, trachea,  and esophagus demonstrate no significant findings. Lungs/Pleura: Very mild atelectasis is seen within the left upper lobe and right lower lobe. There is no evidence of acute infiltrate, pleural effusion or pneumothorax. Musculoskeletal: Marked severity degenerative changes seen throughout the thoracic spine with a chronic deformity seen involving the T10 vertebral body. CT ABDOMEN PELVIS FINDINGS Hepatobiliary: No focal liver abnormality is seen. No gallstones, gallbladder wall thickening, or biliary dilatation. Pancreas: Unremarkable. No pancreatic ductal dilatation or surrounding inflammatory changes. Spleen: Normal in size without focal abnormality. Adrenals/Urinary Tract: Adrenal glands are unremarkable. Kidneys are normal in size without renal calculi or hydronephrosis. A 1.7 cm cyst is seen along the lateral aspect of the mid left kidney. Bladder is unremarkable. Stomach/Bowel: Stomach is within  normal limits. Surgical clips are seen along the esophageal hiatus. Appendix appears normal. No evidence of bowel dilatation. Mild thickening of the distal descending colon is seen with a mild amount of pericolonic inflammatory fat stranding. Noninflamed diverticula are seen throughout the large bowel. Vascular/Lymphatic: No significant vascular findings are present. No enlarged abdominal or pelvic lymph nodes. Reproductive: Prostate is unremarkable. Other: There is a 3.6 cm x 3.8 cm fat containing umbilical hernia. Musculoskeletal: Moderate to marked severity multilevel degenerative changes seen throughout the lumbar spine. IMPRESSION: 1. Mild distal descending colon diverticulitis. 2. Noninflamed diverticula throughout the remainder of the large bowel. 3. 3.6 cm x 3.8 cm fat containing umbilical hernia. 4. Marked severity degenerative changes throughout the thoracic and lumbar spine with a chronic deformity involving the T10 vertebral body. 5. Aortic atherosclerosis. Aortic Atherosclerosis (ICD10-I70.0).  Electronically Signed   By: Virgina Norfolk M.D.   On: 04/29/2020 17:46   CT Chest Wo Contrast  Result Date: 04/29/2020 CLINICAL DATA:  Umbilical pain and lower back pain. EXAM: CT CHEST, ABDOMEN AND PELVIS WITHOUT CONTRAST TECHNIQUE: Multidetector CT imaging of the chest, abdomen and pelvis was performed following the standard protocol without IV contrast. COMPARISON:  None. FINDINGS: CT CHEST FINDINGS Cardiovascular: There is mild calcification of the aortic arch. Normal heart size. No pericardial effusion. Mediastinum/Nodes: No enlarged mediastinal, hilar, or axillary lymph nodes. Thyroid gland, trachea, and esophagus demonstrate no significant findings. Lungs/Pleura: Very mild atelectasis is seen within the left upper lobe and right lower lobe. There is no evidence of acute infiltrate, pleural effusion or pneumothorax. Musculoskeletal: Marked severity degenerative changes seen throughout the thoracic spine with a chronic deformity seen involving the T10 vertebral body. CT ABDOMEN PELVIS FINDINGS Hepatobiliary: No focal liver abnormality is seen. No gallstones, gallbladder wall thickening, or biliary dilatation. Pancreas: Unremarkable. No pancreatic ductal dilatation or surrounding inflammatory changes. Spleen: Normal in size without focal abnormality. Adrenals/Urinary Tract: Adrenal glands are unremarkable. Kidneys are normal in size without renal calculi or hydronephrosis. A 1.7 cm cyst is seen along the lateral aspect of the mid left kidney. Bladder is unremarkable. Stomach/Bowel: Stomach is within normal limits. Surgical clips are seen along the esophageal hiatus. Appendix appears normal. No evidence of bowel dilatation. Mild thickening of the distal descending colon is seen with a mild amount of pericolonic inflammatory fat stranding. Noninflamed diverticula are seen throughout the large bowel. Vascular/Lymphatic: No significant vascular findings are present. No enlarged abdominal or pelvic lymph  nodes. Reproductive: Prostate is unremarkable. Other: There is a 3.6 cm x 3.8 cm fat containing umbilical hernia. Musculoskeletal: Moderate to marked severity multilevel degenerative changes seen throughout the lumbar spine. IMPRESSION: 1. Mild distal descending colon diverticulitis. 2. Noninflamed diverticula throughout the remainder of the large bowel. 3. 3.6 cm x 3.8 cm fat containing umbilical hernia. 4. Marked severity degenerative changes throughout the thoracic and lumbar spine with a chronic deformity involving the T10 vertebral body. 5. Aortic atherosclerosis. Aortic Atherosclerosis (ICD10-I70.0). Electronically Signed   By: Virgina Norfolk M.D.   On: 04/29/2020 17:47   US RENAL  Result Date: 04/29/2020 CLINICAL DATA:  Acute kidney injury. EXAM: RENAL / URINARY TRACT ULTRASOUND COMPLETE COMPARISON:  Abdomen/pelvis CT 04/29/2020 renal ultrasound exam 02/25/2015. FINDINGS: Right Kidney: Renal measurements: 8.9 x 5.5 x 3.8 cm = volume: 98 mL. Diffusely increased cortical echogenicity without hydronephrosis. 2.6 cm hypoechoic lesion towards the upper pole is likely a cyst and is not substantially changed since previous ultrasound of 02/25/2015 consistent with benign etiology. Left Kidney: Renal measurements: 11.2  x 6.0 x 3.4 cm = volume: 120 mL. Diffusely increased echogenicity without hydronephrosis. 1.7 cm exophytic cyst noted towards the lower pole. A second 1.7 cm cyst is identified in the interpolar region. Bladder: Appears normal for degree of bladder distention. Other: None. IMPRESSION: 1. No evidence for hydronephrosis. 2. Diffusely increased echogenicity of both kidneys suggesting medical renal disease. Electronically Signed   By: Misty Stanley M.D.   On: 04/29/2020 20:20    Microbiology: Recent Results (from the past 240 hour(s))  SARS Coronavirus 2 by RT PCR (hospital order, performed in [facility] hospital lab) Nasopharyngeal Nasopharyngeal Swab     Status: None   Collection Time:  04/29/20  4:55 PM   Specimen: Nasopharyngeal Swab  Result Value Ref Range Status   SARS Coronavirus 2 NEGATIVE NEGATIVE Final    Comment: (NOTE) SARS-CoV-2 target nucleic acids are NOT DETECTED.  The SARS-CoV-2 RNA is generally detectable in upper and lower respiratory specimens during the acute phase of infection. The lowest concentration of SARS-CoV-2 viral copies this assay can detect is 250 copies / mL. A negative result does not preclude SARS-CoV-2 infection and should not be used as the sole basis for treatment or other patient management decisions.  A negative result may occur with improper specimen collection / handling, submission of specimen other than nasopharyngeal swab, presence of viral mutation(s) within the areas targeted by this assay, and inadequate number of viral copies (<250 copies / mL). A negative result must be combined with clinical observations, patient history, and epidemiological information.  Fact Sheet for Patients:   StrictlyIdeas.no  Fact Sheet for Healthcare Providers: BankingDealers.co.za  This test is not yet approved or  cleared by the [place] FDA and has been authorized for detection and/or diagnosis of SARS-CoV-2 by FDA under an Emergency Use Authorization (EUA).  This EUA will remain in effect (meaning this test can be used) for the duration of the COVID-19 declaration under Section 564(b)(1) of the Act, 21 U.S.C. section 360bbb-3(b)(1), unless the authorization is terminated or revoked sooner.  Performed at [facility], [place] [address] [place], [place] [address]      Labs: Basic Metabolic Panel: Recent Labs  Lab 04/29/20 [phone] 04/30/20 0431 05/01/20 0453 05/02/20 0538 05/03/20 0918  NA 137 140 141 140 140  K 3.9 3.9 4.0 3.9 4.0  CL 102 105 110 109 108  CO2 18* 20* 20* 18* 19*  GLUCOSE 108* 84 91 77 125*  BUN 92* 89* 80* 74* 64*  CREATININE 5.24* 4.73* 4.48* 4.39*  4.34*  CALCIUM 11.3* 10.9* 10.6* 10.5* 10.4*  PHOS  --  6.9* 5.7* 5.2*  --    Liver Function Tests: Recent Labs  Lab 04/29/20 0826  AST 20  ALT 14  ALKPHOS 61  BILITOT 0.8  PROT 8.5*  ALBUMIN 3.1*   Recent Labs  Lab 04/29/20 0826  LIPASE 45   No results for input(s): AMMONIA in the last 168 hours. CBC: Recent Labs  Lab 04/29/20 0826 04/30/20 0431 05/01/20 0453 05/02/20 0538 05/03/20 0918  WBC 8.5 7.4 8.3 7.8 7.6  HGB 8.8* 8.2* 7.5* 7.6* 7.7*  HCT 27.9* 26.1* 24.5* 24.9* 25.2*  MCV 105.7* 105.7* 107.5* 107.8* 107.7*  PLT 212 200 187 211 214   Cardiac Enzymes: No results for input(s): CKTOTAL, CKMB, CKMBINDEX, TROPONINI in the last 168 hours. BNP: BNP (last 3 results) No results for input(s): BNP in the last 8760 hours.  ProBNP (last 3 results) No results for input(s): PROBNP in the  last 8760 hours.  CBG: No results for input(s): GLUCAP in the last 168 hours.     Signed:  Kayleen Memos, MD [facility] 05/03/2020, 10:30 AM

## 2020-05-03 NOTE — Progress Notes (Signed)
Pt given discharge summary and discharged via family as transportation.  

## 2020-07-26 ENCOUNTER — Inpatient Hospital Stay (HOSPITAL_COMMUNITY)
Admission: EM | Admit: 2020-07-26 | Discharge: 2020-08-01 | DRG: 871 | Disposition: A | Discharge status: Skilled Nursing Facility | Payer: 59 | Attending: Internal Medicine | Admitting: Internal Medicine

## 2020-07-26 ENCOUNTER — Encounter (HOSPITAL_COMMUNITY): Payer: Self-pay | Admitting: *Deleted

## 2020-07-26 ENCOUNTER — Emergency Department (HOSPITAL_COMMUNITY): Payer: 59

## 2020-07-26 ENCOUNTER — Other Ambulatory Visit: Payer: Self-pay

## 2020-07-26 DIAGNOSIS — L97129 Non-pressure chronic ulcer of left thigh with unspecified severity: Secondary | ICD-10-CM | POA: Diagnosis present

## 2020-07-26 DIAGNOSIS — Z419 Encounter for procedure for purposes other than remedying health state, unspecified: Secondary | ICD-10-CM

## 2020-07-26 DIAGNOSIS — M810 Age-related osteoporosis without current pathological fracture: Secondary | ICD-10-CM | POA: Diagnosis present

## 2020-07-26 DIAGNOSIS — Z79899 Other long term (current) drug therapy: Secondary | ICD-10-CM

## 2020-07-26 DIAGNOSIS — M1711 Unilateral primary osteoarthritis, right knee: Secondary | ICD-10-CM | POA: Diagnosis present

## 2020-07-26 DIAGNOSIS — R0602 Shortness of breath: Secondary | ICD-10-CM

## 2020-07-26 DIAGNOSIS — M109 Gout, unspecified: Secondary | ICD-10-CM | POA: Diagnosis present

## 2020-07-26 DIAGNOSIS — Y92010 Kitchen of single-family (private) house as the place of occurrence of the external cause: Secondary | ICD-10-CM

## 2020-07-26 DIAGNOSIS — Y792 Prosthetic and other implants, materials and accessory orthopedic devices associated with adverse incidents: Secondary | ICD-10-CM | POA: Diagnosis present

## 2020-07-26 DIAGNOSIS — N184 Chronic kidney disease, stage 4 (severe): Secondary | ICD-10-CM | POA: Diagnosis not present

## 2020-07-26 DIAGNOSIS — I4892 Unspecified atrial flutter: Secondary | ICD-10-CM | POA: Diagnosis present

## 2020-07-26 DIAGNOSIS — M1A9XX0 Chronic gout, unspecified, without tophus (tophi): Secondary | ICD-10-CM | POA: Diagnosis not present

## 2020-07-26 DIAGNOSIS — Z6837 Body mass index (BMI) 37.0-37.9, adult: Secondary | ICD-10-CM

## 2020-07-26 DIAGNOSIS — D631 Anemia in chronic kidney disease: Secondary | ICD-10-CM | POA: Diagnosis present

## 2020-07-26 DIAGNOSIS — E669 Obesity, unspecified: Secondary | ICD-10-CM | POA: Diagnosis present

## 2020-07-26 DIAGNOSIS — Z8711 Personal history of peptic ulcer disease: Secondary | ICD-10-CM

## 2020-07-26 DIAGNOSIS — F17210 Nicotine dependence, cigarettes, uncomplicated: Secondary | ICD-10-CM | POA: Diagnosis present

## 2020-07-26 DIAGNOSIS — A419 Sepsis, unspecified organism: Principal | ICD-10-CM | POA: Diagnosis present

## 2020-07-26 DIAGNOSIS — I120 Hypertensive chronic kidney disease with stage 5 chronic kidney disease or end stage renal disease: Secondary | ICD-10-CM | POA: Diagnosis present

## 2020-07-26 DIAGNOSIS — Z8719 Personal history of other diseases of the digestive system: Secondary | ICD-10-CM

## 2020-07-26 DIAGNOSIS — N2581 Secondary hyperparathyroidism of renal origin: Secondary | ICD-10-CM | POA: Diagnosis present

## 2020-07-26 DIAGNOSIS — E872 Acidosis: Secondary | ICD-10-CM | POA: Diagnosis present

## 2020-07-26 DIAGNOSIS — N185 Chronic kidney disease, stage 5: Secondary | ICD-10-CM | POA: Diagnosis present

## 2020-07-26 DIAGNOSIS — Z8249 Family history of ischemic heart disease and other diseases of the circulatory system: Secondary | ICD-10-CM

## 2020-07-26 DIAGNOSIS — R300 Dysuria: Secondary | ICD-10-CM | POA: Diagnosis present

## 2020-07-26 DIAGNOSIS — S73005A Unspecified dislocation of left hip, initial encounter: Secondary | ICD-10-CM

## 2020-07-26 DIAGNOSIS — M6282 Rhabdomyolysis: Secondary | ICD-10-CM | POA: Diagnosis present

## 2020-07-26 DIAGNOSIS — I10 Essential (primary) hypertension: Secondary | ICD-10-CM

## 2020-07-26 DIAGNOSIS — Z7982 Long term (current) use of aspirin: Secondary | ICD-10-CM

## 2020-07-26 DIAGNOSIS — E785 Hyperlipidemia, unspecified: Secondary | ICD-10-CM | POA: Diagnosis present

## 2020-07-26 DIAGNOSIS — E1122 Type 2 diabetes mellitus with diabetic chronic kidney disease: Secondary | ICD-10-CM | POA: Diagnosis present

## 2020-07-26 DIAGNOSIS — T84021A Dislocation of internal left hip prosthesis, initial encounter: Secondary | ICD-10-CM | POA: Diagnosis present

## 2020-07-26 DIAGNOSIS — Z880 Allergy status to penicillin: Secondary | ICD-10-CM

## 2020-07-26 DIAGNOSIS — J189 Pneumonia, unspecified organism: Secondary | ICD-10-CM | POA: Diagnosis present

## 2020-07-26 DIAGNOSIS — W010XXA Fall on same level from slipping, tripping and stumbling without subsequent striking against object, initial encounter: Secondary | ICD-10-CM | POA: Diagnosis present

## 2020-07-26 DIAGNOSIS — R6521 Severe sepsis with septic shock: Secondary | ICD-10-CM | POA: Diagnosis present

## 2020-07-26 DIAGNOSIS — L97119 Non-pressure chronic ulcer of right thigh with unspecified severity: Secondary | ICD-10-CM | POA: Diagnosis present

## 2020-07-26 DIAGNOSIS — E861 Hypovolemia: Secondary | ICD-10-CM | POA: Diagnosis not present

## 2020-07-26 DIAGNOSIS — Z20822 Contact with and (suspected) exposure to covid-19: Secondary | ICD-10-CM | POA: Diagnosis present

## 2020-07-26 LAB — CBC WITH DIFFERENTIAL/PLATELET
Abs Immature Granulocytes: 0.1 10*3/uL — ABNORMAL HIGH (ref 0.00–0.07)
Basophils Absolute: 0 10*3/uL (ref 0.0–0.1)
Basophils Relative: 0 %
Eosinophils Absolute: 0 10*3/uL (ref 0.0–0.5)
Eosinophils Relative: 0 %
HCT: 28.1 % — ABNORMAL LOW (ref 39.0–52.0)
Hemoglobin: 9.1 g/dL — ABNORMAL LOW (ref 13.0–17.0)
Immature Granulocytes: 1 %
Lymphocytes Relative: 10 %
Lymphs Abs: 1.2 10*3/uL (ref 0.7–4.0)
MCH: 33.2 pg (ref 26.0–34.0)
MCHC: 32.4 g/dL (ref 30.0–36.0)
MCV: 102.6 fL — ABNORMAL HIGH (ref 80.0–100.0)
Monocytes Absolute: 0.9 10*3/uL (ref 0.1–1.0)
Monocytes Relative: 8 %
Neutro Abs: 9.8 10*3/uL — ABNORMAL HIGH (ref 1.7–7.7)
Neutrophils Relative %: 81 %
Platelets: 202 10*3/uL (ref 150–400)
RBC: 2.74 MIL/uL — ABNORMAL LOW (ref 4.22–5.81)
RDW: 13.8 % (ref 11.5–15.5)
WBC: 12 10*3/uL — ABNORMAL HIGH (ref 4.0–10.5)
nRBC: 0 % (ref 0.0–0.2)

## 2020-07-26 LAB — URINALYSIS, ROUTINE W REFLEX MICROSCOPIC
Bacteria, UA: NONE SEEN
Bilirubin Urine: NEGATIVE
Glucose, UA: NEGATIVE mg/dL
Ketones, ur: NEGATIVE mg/dL
Leukocytes,Ua: NEGATIVE
Nitrite: NEGATIVE
Protein, ur: 100 mg/dL — AB
Specific Gravity, Urine: 1.012 (ref 1.005–1.030)
pH: 5 (ref 5.0–8.0)

## 2020-07-26 LAB — COMPREHENSIVE METABOLIC PANEL
ALT: 15 U/L (ref 0–44)
AST: 45 U/L — ABNORMAL HIGH (ref 15–41)
Albumin: 3.2 g/dL — ABNORMAL LOW (ref 3.5–5.0)
Alkaline Phosphatase: 73 U/L (ref 38–126)
Anion gap: 18 — ABNORMAL HIGH (ref 5–15)
BUN: 112 mg/dL — ABNORMAL HIGH (ref 8–23)
CO2: 16 mmol/L — ABNORMAL LOW (ref 22–32)
Calcium: 12.3 mg/dL — ABNORMAL HIGH (ref 8.9–10.3)
Chloride: 105 mmol/L (ref 98–111)
Creatinine, Ser: 4.93 mg/dL — ABNORMAL HIGH (ref 0.61–1.24)
GFR calc Af Amer: 14 mL/min — ABNORMAL LOW (ref >60)
GFR calc non Af Amer: 12 mL/min — ABNORMAL LOW (ref >60)
Glucose, Bld: 109 mg/dL — ABNORMAL HIGH (ref 70–99)
Potassium: 4.5 mmol/L (ref 3.5–5.1)
Sodium: 139 mmol/L (ref 135–145)
Total Bilirubin: 1 mg/dL (ref 0.3–1.2)
Total Protein: 8.7 g/dL — ABNORMAL HIGH (ref 6.5–8.1)

## 2020-07-26 LAB — TSH: TSH: 1.174 u[IU]/mL (ref 0.350–4.500)

## 2020-07-26 LAB — MAGNESIUM: Magnesium: 2 mg/dL (ref 1.7–2.4)

## 2020-07-26 LAB — SARS CORONAVIRUS 2 BY RT PCR (HOSPITAL ORDER, PERFORMED IN ~~LOC~~ HOSPITAL LAB): SARS Coronavirus 2: NEGATIVE

## 2020-07-26 LAB — LACTIC ACID, PLASMA: Lactic Acid, Venous: 1.5 mmol/L (ref 0.5–1.9)

## 2020-07-26 LAB — CK: Total CK: 958 U/L — ABNORMAL HIGH (ref 49–397)

## 2020-07-26 MED ORDER — DOXYCYCLINE HYCLATE 100 MG PO TABS
100.0000 mg | ORAL_TABLET | Freq: Once | ORAL | Status: AC
  Administered 2020-07-26: 100 mg via ORAL
  Filled 2020-07-26: qty 1

## 2020-07-26 MED ORDER — OXYCODONE HCL 5 MG PO TABS: 5.0000 mg | ORAL_TABLET | ORAL | Status: DC | PRN

## 2020-07-26 MED ORDER — FUROSEMIDE 40 MG PO TABS
40.0000 mg | ORAL_TABLET | Freq: Two times a day (BID) | ORAL | Status: DC
  Administered 2020-07-27 – 2020-07-29 (×5): 40 mg via ORAL
  Filled 2020-07-26 (×5): qty 1

## 2020-07-26 MED ORDER — SODIUM BICARBONATE 650 MG PO TABS
650.0000 mg | ORAL_TABLET | Freq: Two times a day (BID) | ORAL | Status: DC
  Administered 2020-07-26 – 2020-08-01 (×12): 650 mg via ORAL
  Filled 2020-07-26 (×12): qty 1

## 2020-07-26 MED ORDER — ACETAMINOPHEN 325 MG PO TABS
650.0000 mg | ORAL_TABLET | Freq: Once | ORAL | Status: AC
  Administered 2020-07-26: 650 mg via ORAL
  Filled 2020-07-26: qty 2

## 2020-07-26 MED ORDER — SODIUM CHLORIDE 0.9 % IV BOLUS: 500.0000 mL | Freq: Once | INTRAVENOUS | Status: DC

## 2020-07-26 MED ORDER — ALLOPURINOL 300 MG PO TABS
300.0000 mg | ORAL_TABLET | Freq: Every day | ORAL | Status: DC
  Administered 2020-07-26 – 2020-08-01 (×7): 300 mg via ORAL
  Filled 2020-07-26 (×7): qty 1

## 2020-07-26 MED ORDER — SODIUM CHLORIDE 0.9 % IV SOLN: INTRAVENOUS | Status: DC

## 2020-07-26 MED ORDER — DOXYCYCLINE HYCLATE 100 MG PO TABS
100.0000 mg | ORAL_TABLET | Freq: Two times a day (BID) | ORAL | Status: DC
  Administered 2020-07-27 – 2020-08-01 (×12): 100 mg via ORAL
  Filled 2020-07-26 (×12): qty 1

## 2020-07-26 MED ORDER — METOPROLOL TARTRATE 5 MG/5ML IV SOLN
5.0000 mg | INTRAVENOUS | Status: DC | PRN
  Administered 2020-07-26 – 2020-07-27 (×2): 5 mg via INTRAVENOUS
  Filled 2020-07-26 (×2): qty 5

## 2020-07-26 MED ORDER — SODIUM CHLORIDE 0.9 % IV BOLUS
1000.0000 mL | Freq: Once | INTRAVENOUS | Status: AC
  Administered 2020-07-26: 1000 mL via INTRAVENOUS

## 2020-07-26 MED ORDER — ACETAMINOPHEN 650 MG RE SUPP: 650.0000 mg | Freq: Four times a day (QID) | RECTAL | Status: DC | PRN

## 2020-07-26 MED ORDER — SODIUM CHLORIDE 0.9 % IV SOLN
1.0000 g | INTRAVENOUS | Status: AC
  Administered 2020-07-27 – 2020-08-01 (×6): 1 g via INTRAVENOUS
  Filled 2020-07-26 (×6): qty 10

## 2020-07-26 MED ORDER — HYDROCODONE-ACETAMINOPHEN 5-325 MG PO TABS
1.0000 | ORAL_TABLET | Freq: Four times a day (QID) | ORAL | Status: DC | PRN
  Administered 2020-07-30 – 2020-07-31 (×2): 1 via ORAL
  Filled 2020-07-26 (×2): qty 1

## 2020-07-26 MED ORDER — ACETAMINOPHEN 325 MG PO TABS: 650.0000 mg | ORAL_TABLET | Freq: Four times a day (QID) | ORAL | Status: DC | PRN

## 2020-07-26 MED ORDER — FENTANYL CITRATE (PF) 100 MCG/2ML IJ SOLN
100.0000 ug | Freq: Once | INTRAMUSCULAR | Status: AC
  Administered 2020-07-26: 100 ug via INTRAVENOUS
  Filled 2020-07-26: qty 2

## 2020-07-26 MED ORDER — CARVEDILOL 25 MG PO TABS
25.0000 mg | ORAL_TABLET | Freq: Every morning | ORAL | Status: DC
  Administered 2020-07-26: 25 mg via ORAL
  Filled 2020-07-26: qty 8

## 2020-07-26 MED ORDER — SODIUM CHLORIDE 0.9 % IV SOLN
1.0000 g | Freq: Once | INTRAVENOUS | Status: AC
  Administered 2020-07-26: 1 g via INTRAVENOUS
  Filled 2020-07-26: qty 10

## 2020-07-26 NOTE — Assessment & Plan Note (Addendum)
--  Appears to be at baseline.  Followed by Dr. Posey Pronto. --Continue furosemide and sodium bicarbonate

## 2020-07-26 NOTE — ED Provider Notes (Signed)
Care assumed from Carmon Sails, PA-C at shift change with admission to hospitalist pending.   In brief, this patient is a 59 y.o. M who fell yesterday and had been on the ground for approximately 24 hours.  He reports that he dropped his given the floor and slipped on it.  He states since then, he has had pain in his left hip and has had difficulty ambulating bearing weight since then.  He denies any head injury or LOC.  Please see note from previous provider for full history/physical exam.  Physical Exam  BP (!) 148/109   Pulse (!) 126   Temp 100.2 F (37.9 C) (Rectal)   Resp (!) 22   Ht 5\' 9"  (1.753 m)   Wt 115.7 kg   SpO2 100%   BMI 37.66 kg/m   Physical Exam  ED Course/Procedures   Clinical Course as of Jul 27 1651  Sat Jul 26, 2020  1351 Pulse Rate(!): 126 [CG]  1351 Temp: 100.2 F (37.9 C) [CG]  1351 MAP (mmHg): 118 [CG]  1351 BP(!): 148/109 [CG]  1351 Sinus tachy, PVC, HR 120s   EKG 12-Lead [CG]  1351 Protein(!): 100 [CG]  1351 Bacteria, UA: NONE SEEN [CG]  1454 1. Opacification over the right midlung which may be due to atelectasis or infection. 2. Stable cardiomegaly.  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  DG Hip Unilat W or Wo Pelvis 2-3 Views Left [CG]  1454 Non acute lumbar x-ray, left hip dislocation   DG Lumbar Spine Complete [CG]  1455 WBC(!): 12.0 [CG]  1455 Hemoglobin(!): 9.1 [CG]  1455 Better than baseline ~7   Hemoglobin(!): 9.1 [CG]  1503 At baseline   Creatinine(!): 4.93 [CG]  1503 CO2(!): 16 [CG]  1503 Anion gap(!): 18 [CG]  1526 Talked to Dr Lynann Bologna with ortho, appreciate his help in managing this patient    [CG]    Clinical Course User Index [CG] Kinnie Feil, PA-C    Procedures   Results for orders placed or performed during the hospital encounter of 07/26/20 (from the past 24 hour(s))  Urinalysis, Routine w reflex microscopic Urine, Clean Catch      Status: Abnormal   Collection Time: 07/26/20 12:44 PM  Result Value Ref Range   Color, Urine YELLOW YELLOW   APPearance CLEAR CLEAR   Specific Gravity, Urine 1.012 1.005 - 1.030   pH 5.0 5.0 - 8.0   Glucose, UA NEGATIVE NEGATIVE mg/dL   Hgb urine dipstick MODERATE (A) NEGATIVE   Bilirubin Urine NEGATIVE NEGATIVE   Ketones, ur NEGATIVE NEGATIVE mg/dL   Protein, ur 100 (A) NEGATIVE mg/dL   Nitrite NEGATIVE NEGATIVE   Leukocytes,Ua NEGATIVE NEGATIVE   RBC / HPF 0-5 0 - 5 RBC/hpf   WBC, UA 0-5 0 - 5 WBC/hpf   Bacteria, UA NONE SEEN NONE SEEN   Squamous Epithelial / LPF 0-5 0 - 5  CBC with Differential/Platelet     Status: Abnormal   Collection Time: 07/26/20  2:09 PM  Result Value Ref Range   WBC 12.0 (H) 4.0 - 10.5 K/uL   RBC 2.74 (L) 4.22 - 5.81 MIL/uL   Hemoglobin 9.1 (L) 13.0 - 17.0 g/dL   HCT 28.1 (L) 39 - 52 %   MCV 102.6 (H) 80.0 - 100.0 fL   MCH 33.2 26.0 - 34.0 pg   MCHC 32.4 30.0 - 36.0 g/dL   RDW 13.8 11.5 - 15.5 %  Platelets 202 150 - 400 K/uL   nRBC 0.0 0.0 - 0.2 %   Neutrophils Relative % 81 %   Neutro Abs 9.8 (H) 1.7 - 7.7 K/uL   Lymphocytes Relative 10 %   Lymphs Abs 1.2 0.7 - 4.0 K/uL   Monocytes Relative 8 %   Monocytes Absolute 0.9 0 - 1 K/uL   Eosinophils Relative 0 %   Eosinophils Absolute 0.0 0 - 0 K/uL   Basophils Relative 0 %   Basophils Absolute 0.0 0 - 0 K/uL   Immature Granulocytes 1 %   Abs Immature Granulocytes 0.10 (H) 0.00 - 0.07 K/uL  Comprehensive metabolic panel     Status: Abnormal   Collection Time: 07/26/20  2:09 PM  Result Value Ref Range   Sodium 139 135 - 145 mmol/L   Potassium 4.5 3.5 - 5.1 mmol/L   Chloride 105 98 - 111 mmol/L   CO2 16 (L) 22 - 32 mmol/L   Glucose, Bld 109 (H) 70 - 99 mg/dL   BUN 112 (H) 8 - 23 mg/dL   Creatinine, Ser 4.93 (H) 0.61 - 1.24 mg/dL   Calcium 12.3 (H) 8.9 - 10.3 mg/dL   Total Protein 8.7 (H) 6.5 - 8.1 g/dL   Albumin 3.2 (L) 3.5 - 5.0 g/dL   AST 45 (H) 15 - 41 U/L   ALT 15 0 - 44 U/L    Alkaline Phosphatase 73 38 - 126 U/L   Total Bilirubin 1.0 0.3 - 1.2 mg/dL   GFR calc non Af Amer 12 (L) >60 mL/min   GFR calc Af Amer 14 (L) >60 mL/min   Anion gap 18 (H) 5 - 15  Magnesium     Status: None   Collection Time: 07/26/20  2:09 PM  Result Value Ref Range   Magnesium 2.0 1.7 - 2.4 mg/dL  TSH     Status: None   Collection Time: 07/26/20  2:09 PM  Result Value Ref Range   TSH 1.174 0.350 - 4.500 uIU/mL  CK     Status: Abnormal   Collection Time: 07/26/20  2:09 PM  Result Value Ref Range   Total CK 958 (H) 49.0 - 397.0 U/L  SARS Coronavirus 2 by RT PCR (hospital order, performed in [facility] hospital lab) Nasopharyngeal Nasopharyngeal Swab     Status: None   Collection Time: 07/26/20  2:12 PM   Specimen: Nasopharyngeal Swab  Result Value Ref Range   SARS Coronavirus 2 NEGATIVE NEGATIVE    MDM    MDM: Patient with CK of greater than 900.  Mild AKI but history of CKD.  He also has atelectasis on chest x-ray.  Could be concerning for possible onset of infectious process.  Concern for SIRS response the patient was started on antibiotics.  Previous provider talked with Dr. Lynann Bologna who will reduce patient in the OR later today.  I discussed with Dr. Sarajane Jews (hospitalist) who accepts patient for admission.    1. Dislocation, hip closed, left, initial encounter ([place])   2. Sepsis, due to unspecified organism, unspecified whether acute organ dysfunction present ([place])   3. Non-traumatic rhabdomyolysis    Portions of this note were generated with Dragon dictation software. Dictation errors may occur despite best attempts at proofreading.    Volanda Napoleon, PA-C 07/26/20 1654    Drenda Freeze, MD 07/26/20 [phone]

## 2020-07-26 NOTE — Assessment & Plan Note (Signed)
--  Doubt this diagnosis --2 view chest x-ray in a.m., low threshold to discontinue antibiotics

## 2020-07-26 NOTE — Assessment & Plan Note (Addendum)
--  Traumatic from mechanical fall at home --Management per orthopedics, plan for closed reduction in the OR --Although sepsis is considered based on emergency department physician evaluation, tachycardia is probably from missed beta-blocker dose and leukocytosis was probably stress reaction from fall.  He has had a cough and his chest x-ray is rotated and difficult to interpret but I think sepsis and significant pneumonia is unlikely.  We will dose antibiotics and follow-up chest x-ray in a.m. but I do not think this should preclude orthopedic intervention today. --Patient has no history of cardiac disease.  Currently tachycardic from missed beta-blocker dose today and yesterday.  EKG is nonacute.  No history of cardiac symptoms.  He has chronic shortness of breath but this appears to be at baseline he remains somewhat active including taking care of his lawn.  Kidney function appears to be at baseline and potassium within normal limits. --Dose beta-blocker now, proceed with closed hip reduction as per orthopedics

## 2020-07-26 NOTE — Assessment & Plan Note (Signed)
--  Hypertensive from pain.  Restart carvedilol as above. --Resume Lasix

## 2020-07-26 NOTE — Assessment & Plan Note (Addendum)
--  Possible diagnosis although I am skeptical, tachycardia probably rebound from missed beta-blocker doses and pain, leukocytosis probably from stress reaction rather than infection.  Favor atelectasis.  No history to suggest aspiration.  Lactic acid within normal limits and I do not think sepsis is likely.  Chest x-ray equivocal.  Will plan a 2 view study after hip has been reduced.  For now we will continue antibiotics but low threshold to discontinue.

## 2020-07-26 NOTE — Progress Notes (Signed)
Pt HR sustaining 120s-130s- was notified by central tele that pt was in aflutter- ekg done- noted sinus tach with st elevation-pt is asymptomatic- I paged [facility] [place]-  Orders for Troponin stat and iv metoprolol

## 2020-07-26 NOTE — H&P (Signed)
History and Physical  Dravon Nott Y[place]arte YQI:[id number] DOB: 21-Aug-1961 DOA: 07/26/2020  PCP: Delrae Rend, MD   Chief Complaint: fall  HPI:  59 year old man PMH CKD stage V followed by Dr. Posey Pronto presented to the emergency department after a fall at home with inability to walk.  Found to have dislocated left hip (PMH hip replacement) as well as possible sepsis and pneumonia.  Orthopedics was contacted with plans to reduce dislocation.  [place] admitting.  Patient reports he has been doing well at home.  Lives alone, ambulates with a cane, last fall was approximately 5 months ago.  Yesterday morning at about 7 AM he slipped in some water and fell onto his left hip.  No head injury, did not pass out.  Because of severe hip pain aggravated by movement he was unable to get up and laid for about 36 hours until such time he was able to crawl to a phone, call EMS and subsequently be transferred to the ED.  He does get short of breath with exertion but generally is fairly active and takes care of his own long and ADLs.  He is not particularly short of breath at this time.  He has been coughing since lying on the floor.  ED Course: Treated with ceftriaxone and doxycycline, orthopedics contacted  Review of Systems:  Review of Systems  Constitutional: Negative for chills and fever.  HENT: Negative for sore throat.   Eyes: Negative for blurred vision.  Respiratory: Positive for cough and shortness of breath.        SOB is chronic  Cardiovascular: Negative for chest pain.  Gastrointestinal: Negative for abdominal pain, diarrhea, nausea and vomiting.  Genitourinary: Negative for dysuria and hematuria.  Musculoskeletal: Positive for joint pain.       Left hip  Skin: Negative for rash.   PMH  CKD stage IV followed by Dr. Posey Pronto  Gout on allopurinol  Essential hypertension on carvedilol  Remainder reviewed in Epic  [place]  Left total hip arthroplasty  Right shoulder surgery  Remainder reviewed in  Epic  Family history includes:  Mother with atrial fibrillation and cancer  Remainder reviewed in [place] when he watches football games  Smokes about 1/4 pack/day  No drugs  Lives alone, performs own ADLs  Allergies  Penicillin, approximately age 75, rash, did not require hospitalization.  No mucous membrane involvement reported.  Meds include: Current Outpatient Medications  Medication Instructions   acetaminophen (TYLENOL) 325 mg, Oral, Every 6 hours PRN   allopurinol (ZYLOPRIM) 300 mg, Oral, Daily after breakfast   carvedilol (COREG) 25 mg, Oral, Daily after breakfast   Cholecalciferol (VITAMIN D3 PO) 1 tablet, Oral, Daily after breakfast   ferrous sulfate 325 mg, Oral, Daily with breakfast   furosemide (LASIX) 40 mg, Oral, 2 times daily   Multiple Vitamin (MULTIVITAMIN WITH MINERALS) TABS tablet 1 tablet, Oral, Daily after breakfast   sodium bicarbonate 650 mg, Oral, 3 times daily    Physicial Exam   Vitals:  Vitals:   07/26/20 1545 07/26/20 1615  BP:    Pulse: (!) 125 (!) 129  Resp: 20 (!) 22  Temp:    SpO2: 99% 100%    Physical Exam Vitals reviewed.  Constitutional:      Appearance: Normal appearance.  HENT:     Head: Normocephalic.     Nose: Nose normal.     Mouth/Throat:     Pharynx: Oropharynx is clear.  Eyes:     Extraocular  Movements: Extraocular movements intact.     Conjunctiva/sclera: Conjunctivae normal.  Neck:     Comments: Neck appears grossly normal Cardiovascular:     Rate and Rhythm: Regular rhythm. Tachycardia present.     Pulses: Normal pulses.     Heart sounds: Normal heart sounds.  Pulmonary:     Effort: Pulmonary effort is normal.     Breath sounds: Normal breath sounds. No wheezing, rhonchi or rales.  Abdominal:     General: There is no distension.     Palpations: Abdomen is soft. There is no mass.     Tenderness: There is no abdominal tenderness. There is no guarding.     Hernia: A hernia  is present.     Comments: Umbilical soft and reducible  Musculoskeletal:        General: Deformity present. No swelling.     Comments: Left lower extremity internally rotated  Skin:    General: Skin is warm and dry.     Coloration: Skin is not jaundiced.     Findings: No bruising, lesion or rash.  Neurological:     General: No focal deficit present.     Mental Status: He is alert.  Psychiatric:        Mood and Affect: Mood normal.        Behavior: Behavior normal.    I have personally reviewed following labs and imaging studies  Labs:   Creatinine stable at 4.93 potassium 4.5 magnesium within normal limits  Modest elevation in AST, remainder LFTs unremarkable  Total CK 958  Lactic acid within normal limits  Hemoglobin stable at 9.1 minimal leukocytosis probably stress reaction  TSH within normal limits  SARS-CoV-2 negative  Urinalysis unremarkable  Imaging studies:   CT head neck no acute abnormalities  Chest x-ray independently reviewed rotated, technique makes difficult interpretation.  I do not see convincing evidence of pneumonia.  Radiologist read opacification right midlung atelectasis or infection.  Based on the patient's exam I would favor atelectasis.  Medical tests:   EKG independently reviewed: Sinus tachycardia, no acute changes compared to previous studies April 29, 2020 and September 07, 2016  ASSESSMENT/PLAN   Closed dislocation of left hip [facility]) --Traumatic from mechanical fall at home --Management per orthopedics, plan for closed reduction in the OR --Although sepsis is considered based on emergency department physician evaluation, tachycardia is probably from missed beta-blocker dose and leukocytosis was probably stress reaction from fall.  He has had a cough and his chest x-ray is rotated and difficult to interpret but I think sepsis and significant pneumonia is unlikely.  We will dose antibiotics and follow-up chest x-ray in a.m. but I do not think  this should preclude orthopedic intervention today. --Patient has no history of cardiac disease.  Currently tachycardic from missed beta-blocker dose today and yesterday.  EKG is nonacute.  No history of cardiac symptoms.  He has chronic shortness of breath but this appears to be at baseline he remains somewhat active including taking care of his lawn.  Kidney function appears to be at baseline and potassium within normal limits. --Dose beta-blocker now, proceed with closed hip reduction as per orthopedics  Sepsis ([name]) --Possible diagnosis although I am skeptical, tachycardia probably rebound from missed beta-blocker doses and pain, leukocytosis probably from stress reaction rather than infection.  Favor atelectasis.  No history to suggest aspiration.  Lactic acid within normal limits and I do not think sepsis is likely.  Chest x-ray equivocal.  Will plan a 2 view  study after hip has been reduced.  For now we will continue antibiotics but low threshold to discontinue.  CKD (chronic kidney disease), stage IV (HCC) --Appears to be at baseline.  Followed by Dr. Posey Pronto. --Continue furosemide and sodium bicarbonate  Gout --Asymptomatic, continue allopurinol  Benign essential HTN --Hypertensive from pain.  Restart carvedilol as above. --Resume Lasix  Pneumonia --Doubt this diagnosis --2 view chest x-ray in a.m., low threshold to discontinue antibiotics  DVT prophylaxis: can start heparin after intervention, if needed but for now early ambulation and SCDs Code Status: Full, confirmed Family Communication: none requested or present; pt alert and understands plan Consults called: orthopedics    Time spent: 60 minutes  Murray Hodgkins, MD  [facility] Direct contact: see [email or web address]  7PM-7AM contact night coverage as below   1. Check the care team in [facility] and look for a) attending/consulting [facility] provider listed and b) the [facility] team listed 2. Log into [email or web address] and use [facility]'s universal password to access. If you do not have the password, please contact the hospital operator. 3. Locate the [facility] provider you are looking for under [facility] and page to a number that you can be directly reached. 4. If you still have difficulty reaching the provider, please page the [facility] (Director on Call) for the Hospitalists listed on amion for assistance.  Severity of Illness: The appropriate patient status for this patient is OBSERVATION. Observation status is judged to be reasonable and necessary in order to provide the required intensity of service to ensure the patient's safety. The patient's presenting symptoms, physical exam findings, and initial radiographic and laboratory data in the context of their medical condition is felt to place them at decreased risk for further clinical deterioration. Furthermore, it is anticipated that the patient will be medically stable for discharge from the hospital within 2 midnights of admission. The following factors support the patient status of observation.   " The patient's presenting symptoms include left hip pain after a fall. " The physical exam findings include internally rotated left lower extremity. " The initial radiographic and laboratory data are closed hip dislocation on the left, CKD stage IV, leukocytosis, possible pneumonia on chest x-ray.  Status is: Observation  The patient remains OBS appropriate and will d/c before 2 midnights.  Dispo: The patient is from: Home              Anticipated d/c is to: Home              Anticipated d/c date is: 1 day              Patient currently is not medically stable to d/c.        07/26/2020, 6:02 PM   Principal Problem:   Closed dislocation of left hip ([place]) Active Problems:   CKD (chronic kidney disease), stage IV (HCC)   Benign essential HTN   Sepsis ([place])   Gout   Pneumonia

## 2020-07-26 NOTE — Consult Note (Signed)
Reason for Consult: Left hip pain Referring Physician: Dr. Orinda Kenner Mark [name] is an 59 y.o. male.  HPI: Patient is a 59 year old male who is status post a left hip pinning for a left femoral neck fracture.  This was performed by Dr. Mardelle Matte.  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  Subsequently, Dr. Norton Pastel proceeded with a total hip arthroplasty in November 2012.  The patient then presented to the emergency department yesterday, status post a fall from the prior day.  The fall happened at about 7 AM on July 24, 2020.  I was called to evaluate the patient after he left hip dislocation was noted.  I did also speak with the physician assistant at the emergency room.  She did express to me that the patient was noted to have a elevated white blood cell count, was febrile, and may be septic, potentially with pneumonia.  She did express to me that she and her medical team was not comfortable with proceeding with a close reduction in the emergency room.   Upon discussion with the patient, he is noted to have left hip pain, increased with any range of motion of the left hip.  He has not been able to ambulate since his fall.  He describes the pain as an aching sensation, in the left inguinal region.  The pain has decreased since it first began.  Past Medical History:  Diagnosis Date  . Anemia    labs today indicate (09/27/11)  . Arthritis    right knee  . Bleeding ulcer    hx of, required 6 units blood  . Blood transfusion    6 units with ulcer repair surgery  . Chronic kidney disease    stage IV, has fistula, never started diaylsis  . Complication of anesthesia    little slow to wake up with last surgery  . Dyspnea    with exertion  . Gout   . Hypertension   . Osteoporosis   . Pneumonia     Past Surgical History:  Procedure Laterality Date  . AV FISTULA PLACEMENT Left 09/07/2016   Procedure: ARTERIOVENOUS (AV) FISTULA CREATION LEFT ARM;  Surgeon: Angelia Mould, MD;  Location: [facility];   Service: Vascular;  Laterality: Left;  . COLONOSCOPY    . EXTERNAL EAR SURGERY    . HIP PINNING  11/2010  . JOINT REPLACEMENT    . OTHER SURGICAL HISTORY  ~ 08/1998   repair bleeding ulcer  . SHOULDER ARTHROSCOPY WITH ROTATOR CUFF REPAIR AND SUBACROMIAL DECOMPRESSION Right 05/26/2017   Procedure: SHOULDER ARTHROSCOPY WITH ROTATOR CUFF REPAIR AND SUBACROMIAL DECOMPRESSION;  Surgeon: Tania Ade, MD;  Location: [facility];  Service: Orthopedics;  Laterality: Right;  SHOULDER ARTHROSCOPY WITH ROTATOR CUFF REPAIR AND SUBACROMIAL DECOMPRESSION  . TOTAL HIP ARTHROPLASTY  10/06/11   left  . TOTAL HIP ARTHROPLASTY  10/06/2011   Procedure: TOTAL HIP ARTHROPLASTY;  Surgeon: Kerin Salen;  Location: [facility];  Service: Orthopedics;  Laterality: Left;    Family History  Problem Relation Age of Onset  . Cancer Mother   . Atrial fibrillation Mother   . Hypertension Mother   . Cancer Father     Social History:  reports that he has been smoking cigarettes. He has a 8.00 pack-year smoking history. He quit smokeless tobacco use about 46 years ago.  His smokeless tobacco use included chew. He reports current alcohol use of about 5.0 standard drinks of alcohol per week. He reports that he does not  use drugs.  Allergies:  Allergies  Allergen Reactions  . Penicillins Swelling, Rash and Other (See Comments)    Arms and eyes swell & skin breaks out  Has patient had a PCN reaction causing immediate rash, facial/tongue/throat swelling, SOB or lightheadedness with hypotension: Yes Has patient had a PCN reaction causing severe rash involving mucus membranes or skin necrosis: Yes Has patient had a PCN reaction that required hospitalization: No Has patient had a PCN reaction occurring within the last 10 years: No If all of the above answers are "NO", then may proceed with Cephalosporin use.     Medications:  Prior to Admission:  Medications Prior to Admission  Medication Sig Dispense Refill Last Dose  .  acetaminophen (TYLENOL) 325 MG tablet Take 325 mg by mouth every 6 (six) hours as needed for mild pain or headache.    week ago  . allopurinol (ZYLOPRIM) 300 MG tablet Take 300 mg by mouth daily after breakfast.    07/24/2020 at am  . carvedilol (COREG) 25 MG tablet Take 25 mg by mouth daily after breakfast.    07/24/2020 at 7am  . Cholecalciferol (VITAMIN D3 PO) Take 1 tablet by mouth daily after breakfast.   07/24/2020 at pm  . ferrous sulfate 325 (65 FE) MG tablet Take 1 tablet (325 mg total) by mouth daily with breakfast. (Patient taking differently: Take 325 mg by mouth daily after breakfast. ) 90 tablet 0 07/24/2020 at am  . furosemide (LASIX) 40 MG tablet Take 40 mg by mouth 2 (two) times daily.   07/24/2020  . Multiple Vitamin (MULTIVITAMIN WITH MINERALS) TABS tablet Take 1 tablet by mouth daily after breakfast.   07/24/2020 at am  . sodium bicarbonate 650 MG tablet Take 650 mg by mouth 3 (three) times daily.   5 07/24/2020 at pm    Results for orders placed or performed during the hospital encounter of 07/26/20 (from the past 48 hour(s))  Urinalysis, Routine w reflex microscopic Urine, Clean Catch     Status: Abnormal   Collection Time: 07/26/20 12:44 PM  Result Value Ref Range   Color, Urine YELLOW YELLOW   APPearance CLEAR CLEAR   Specific Gravity, Urine 1.012 1.005 - 1.030   pH 5.0 5.0 - 8.0   Glucose, UA NEGATIVE NEGATIVE mg/dL   Hgb urine dipstick MODERATE (A) NEGATIVE   Bilirubin Urine NEGATIVE NEGATIVE   Ketones, ur NEGATIVE NEGATIVE mg/dL   Protein, ur 100 (A) NEGATIVE mg/dL   Nitrite NEGATIVE NEGATIVE   Leukocytes,Ua NEGATIVE NEGATIVE   RBC / HPF 0-5 0 - 5 RBC/hpf   WBC, UA 0-5 0 - 5 WBC/hpf   Bacteria, UA NONE SEEN NONE SEEN   Squamous Epithelial / LPF 0-5 0 - 5    Comment: Performed at [facility], [place] [address] [place], [place] [address]  CBC with Differential/Platelet     Status: Abnormal   Collection Time: 07/26/20  2:09 PM  Result Value Ref Range   WBC 12.0  (H) 4.0 - 10.5 K/uL   RBC 2.74 (L) 4.22 - 5.81 MIL/uL   Hemoglobin 9.1 (L) 13.0 - 17.0 g/dL   HCT 28.1 (L) 39 - 52 %   MCV 102.6 (H) 80.0 - 100.0 fL   MCH 33.2 26.0 - 34.0 pg   MCHC 32.4 30.0 - 36.0 g/dL   RDW 13.8 11.5 - 15.5 %   Platelets 202 150 - 400 K/uL   nRBC 0.0 0.0 - 0.2 %   Neutrophils Relative %  81 %   Neutro Abs 9.8 (H) 1.7 - 7.7 K/uL   Lymphocytes Relative 10 %   Lymphs Abs 1.2 0.7 - 4.0 K/uL   Monocytes Relative 8 %   Monocytes Absolute 0.9 0 - 1 K/uL   Eosinophils Relative 0 %   Eosinophils Absolute 0.0 0 - 0 K/uL   Basophils Relative 0 %   Basophils Absolute 0.0 0 - 0 K/uL   Immature Granulocytes 1 %   Abs Immature Granulocytes 0.10 (H) 0.00 - 0.07 K/uL    Comment: Performed at [facility] [address], [place], [place] [address]  Comprehensive metabolic panel     Status: Abnormal   Collection Time: 07/26/20  2:09 PM  Result Value Ref Range   Sodium 139 135 - 145 mmol/L   Potassium 4.5 3.5 - 5.1 mmol/L   Chloride 105 98 - 111 mmol/L   CO2 16 (L) 22 - 32 mmol/L   Glucose, Bld 109 (H) 70 - 99 mg/dL    Comment: Glucose reference range applies only to samples taken after fasting for at least 8 hours.   BUN 112 (H) 8 - 23 mg/dL   Creatinine, Ser 4.93 (H) 0.61 - 1.24 mg/dL   Calcium 12.3 (H) 8.9 - 10.3 mg/dL   Total Protein 8.7 (H) 6.5 - 8.1 g/dL   Albumin 3.2 (L) 3.5 - 5.0 g/dL   AST 45 (H) 15 - 41 U/L   ALT 15 0 - 44 U/L   Alkaline Phosphatase 73 38 - 126 U/L   Total Bilirubin 1.0 0.3 - 1.2 mg/dL   GFR calc non Af Amer 12 (L) >60 mL/min   GFR calc Af Amer 14 (L) >60 mL/min   Anion gap 18 (H) 5 - 15    Comment: Performed at [facility], [place] [address], [place], [place] [address]  Magnesium     Status: None   Collection Time: 07/26/20  2:09 PM  Result Value Ref Range   Magnesium 2.0 1.7 - 2.4 mg/dL    Comment: Performed at [facility], [place] [address], [place], [place] [address]  TSH     Status: None   Collection Time: 07/26/20  2:09  PM  Result Value Ref Range   TSH 1.174 0.350 - 4.500 uIU/mL    Comment: Performed by a 3rd Generation assay with a functional sensitivity of <=0.01 uIU/mL. Performed at [facility], [place] [address] [place], [place] [address]   CK     Status: Abnormal   Collection Time: 07/26/20  2:09 PM  Result Value Ref Range   Total CK 958 (H) 49.0 - 397.0 U/L    Comment: Performed at [facility], [place] [address] [place], [place] [address]  SARS Coronavirus 2 by RT PCR (hospital order, performed in [facility] hospital lab) Nasopharyngeal Nasopharyngeal Swab     Status: None   Collection Time: 07/26/20  2:12 PM   Specimen: Nasopharyngeal Swab  Result Value Ref Range   SARS Coronavirus 2 NEGATIVE NEGATIVE    Comment: (NOTE) SARS-CoV-2 target nucleic acids are NOT DETECTED.  The SARS-CoV-2 RNA is generally detectable in upper and lower respiratory specimens during the acute phase of infection. The lowest concentration of SARS-CoV-2 viral copies this assay can detect is 250 copies / mL. A negative result does not preclude SARS-CoV-2 infection and should not be used as the sole basis for treatment or other patient management decisions.  A negative result may  occur with improper specimen collection / handling, submission of specimen other than nasopharyngeal swab, presence of viral mutation(s) within the areas targeted by this assay, and inadequate number of viral copies (<250 copies / mL). A negative result must be combined with clinical observations, patient history, and epidemiological information.  Fact Sheet for Patients:   StrictlyIdeas.no  Fact Sheet for Healthcare Providers: BankingDealers.co.za  This test is not yet approved or  cleared by the [place] FDA and has been authorized for detection and/or diagnosis of SARS-CoV-2 by FDA under an Emergency Use Authorization (EUA).  This EUA will remain in effect (meaning this test  can be used) for the duration of the COVID-19 declaration under Section 564(b)(1) of the Act, 21 U.S.C. section 360bbb-3(b)(1), unless the authorization is terminated or revoked sooner.  Performed at [facility], [place] [address], [place], [place] [address]   Lactic acid, plasma     Status: None   Collection Time: 07/26/20  4:07 PM  Result Value Ref Range   Lactic Acid, Venous 1.5 0.5 - 1.9 mmol/L    Comment: Performed at [facility] [address] [place], [place] [address]    DG Lumbar Spine Complete  Result Date: 07/26/2020 CLINICAL DATA:  Pain after fall.  Hip deformity. EXAM: LUMBAR SPINE - COMPLETE 4+ VIEW COMPARISON:  None. FINDINGS: Patient is status post left hip replacement. On limited images, there is a dislocation of the femoral component versus the acetabular component. Pelvic bones otherwise normal. Multilevel degenerative disc disease. No fracture or malalignment. Lower lumbar facet degenerative changes noted. IMPRESSION: 1. Left hip dislocation.  See the dedicated left hip films. 2. Degenerative disc disease and lower lumbar facet degenerative changes in the lumbar spine. 3. No fracture or traumatic malalignment. Electronically Signed   By: Dorise Bullion III M.D   On: 07/26/2020 14:02   CT Head Wo Contrast  Result Date: 07/26/2020 CLINICAL DATA:  Status post fall. EXAM: CT HEAD WITHOUT CONTRAST TECHNIQUE: Contiguous axial images were obtained from the base of the skull through the vertex without intravenous contrast. COMPARISON:  None. FINDINGS: Brain: There is mild cerebral atrophy with widening of the extra-axial spaces and ventricular dilatation. There are areas of decreased attenuation within the white matter tracts of the supratentorial brain, consistent with microvascular disease changes. Vascular: No hyperdense vessel or unexpected calcification. Skull: Normal. Negative for fracture or focal lesion. Sinuses/Orbits: No acute finding. Other: Mild  posterolateral scalp soft tissue swelling is seen along the vertex on the right. IMPRESSION: 1. Mild cerebral atrophy. 2. No acute intracranial abnormality. Electronically Signed   By: Virgina Norfolk M.D.   On: 07/26/2020 15:23   CT Cervical Spine Wo Contrast  Result Date: 07/26/2020 CLINICAL DATA:  Status post fall. EXAM: CT CERVICAL SPINE WITHOUT CONTRAST TECHNIQUE: Multidetector CT imaging of the cervical spine was performed without intravenous contrast. Multiplanar CT image reconstructions were also generated. COMPARISON:  None. FINDINGS: Alignment: Normal. Skull base and vertebrae: No acute fracture. No primary bone lesion or focal pathologic process. Chronic changes are seen along the anterior arch of the C1 vertebral body. Soft tissues and spinal canal: No prevertebral fluid or swelling. No visible canal hematoma. Disc levels: Moderate severity endplate sclerosis is seen at the level of C6-C7. There is moderate severity narrowing of the anterior atlantoaxial articulation. Marked severity intervertebral disc space narrowing is seen at this level. Mild intervertebral disc space narrowing is noted at the level of C7-T1. Mild, bilateral multilevel facet joint hypertrophy is  noted. Upper chest: Negative. Other: None. IMPRESSION: 1. No acute osseous abnormality. 2. Marked severity degenerative disc disease at the level of C6-C7. Electronically Signed   By: Virgina Norfolk M.D.   On: 07/26/2020 15:29   DG Chest Portable 1 View  Result Date: 07/26/2020 CLINICAL DATA:  Low-grade fever and fall. EXAM: PORTABLE CHEST 1 VIEW COMPARISON:  10/12/2011 FINDINGS: Lordotic technique is demonstrated. Patient is rotated to the right. Lungs are adequately inflated with opacification over the right midlung which may be due to atelectasis or infection. No effusion. Stable cardiomegaly. Remainder of the exam is unchanged. IMPRESSION: 1. Opacification over the right midlung which may be due to atelectasis or infection.  2. Stable cardiomegaly. Electronically Signed   By: Marin Olp M.D.   On: 07/26/2020 14:30   DG Hip Unilat W or Wo Pelvis 2-3 Views Left  Result Date: 07/26/2020 CLINICAL DATA:  59 year old male with a history of fall EXAM: DG HIP (WITH OR WITHOUT PELVIS) 2-3V LEFT COMPARISON:  CT 04/29/2020 FINDINGS: Posterior dislocation of the left hip arthroplasty. No fracture identified in the proximal femur. No fracture identified at the pelvis. Osteopenia. IMPRESSION: Posterior dislocation of the left hip arthroplasty components Electronically Signed   By: Corrie Mckusick D.O.   On: 07/26/2020 14:03    Review of Systems  Constitutional: Negative.   HENT: Negative.   Respiratory: Negative.   Cardiovascular: Negative.   Musculoskeletal: Negative.   Skin: Negative.   Psychiatric/Behavioral: Negative.    Blood pressure (!) 139/104, pulse (!) 127, temperature 100.2 F (37.9 C), temperature source Rectal, resp. rate 17, height 5\' 9"  (1.753 m), weight 115.7 kg, SpO2 100 %. Physical Exam HENT:     Head: Normocephalic and atraumatic.     Nose: Nose normal.  Eyes:     Pupils: Pupils are equal, round, and reactive to light.  Pulmonary:     Effort: Pulmonary effort is normal.  Musculoskeletal:     Cervical back: Normal range of motion and neck supple.     Comments: + pain with passive ROM of left hip NVI at LEs  Neurological:     Mental Status: He is alert.  Psychiatric:        Mood and Affect: Mood normal.        Behavior: Behavior normal.     Assessment/Plan: Left hip dislocation since 7am yesterday.  I did discuss this patient's situation with Dr. Mayer Camel.  Dr. Mayer Camel did have some concern that since the patient's hip has been dislocated for 36 hours, it may be difficult or potentially impossible to successfully perform a closed reduction under general endotracheal anesthesia, and that the need for an open reduction was substantially increased.  I did discuss this possibility with the patient, and  the patient did express to me that he would prefer to have the option of an open reduction available to him, in the event his hip was not successfully reduced in a closed fashion.  This is not a procedure that I personally performed, at the recommendation of Dr. Mayer Camel, I did reach out to Dr. Marchia Bond, as Dr. Mardelle Matte is the surgeon that performed the patient's initial left hip procedure, in order to gauge his interest in a potential open reduction procedure.  Dr. Mardelle Matte did express that he would be willing to attempt a closed reduction, and potentially proceed with an open reduction if needed, tomorrow morning.  I did speak with the Dr. Sarajane Jews earlier today, and he did feel that the patient's medical  situation is stable, and that proceeding with a close and potentially open reduction would be safe and reasonable.  I did discuss this with the patient.  The patient will be made n.p.o. after midnight tonight, and will proceed with surgery tomorrow morning with Dr. Mardelle Matte.  I greatly appreciate his assistance in this patient's care.  Shaundrea Carrigg L Gina Costilla 07/26/2020, 7:17 PM

## 2020-07-26 NOTE — Assessment & Plan Note (Signed)
--  Asymptomatic, continue allopurinol

## 2020-07-26 NOTE — ED Provider Notes (Signed)
[facility] EMERGENCY DEPARTMENT Provider Note   CSN: [id number] Arrival date & time: 07/26/20  1229     History Chief Complaint  Patient presents with  . Fall    possible hip fx    Mark [name] is a 59 y.o. male with history of hypertension, CKD with baseline creatinine around 4-4.5, gastric ulcer, diverticulitis, obesity, total left hip arthroplasty 2012 brought to the ER by EMS after an mechanical fall that occurred yesterday at 7 AM.  Patient states he dropped an ice cube on the floor in his kitchen and he slipped on it.  He fell down.  Had sudden severe pain in his left hip.  The pain was so severe he could not move to grab a phone up until this afternoon.  He has been on the ground for close to 36 hours.  Denies any head injury, loss of consciousness, headache.  Denies any neck pain.  Denies any other injuries from the fall.  Denies distal left leg tingling, numbness.  Patient states he has been well otherwise.  Chronic runny nose that he takes medicines for.  Fully vaccinated for COVID.  Denies cough, chest pain or shortness of breath.  Denies vomiting or diarrhea recently.  No abdominal pain reports mild dysuria.  No anticoagulants.  HPI     Past Medical History:  Diagnosis Date  . Anemia    labs today indicate (09/27/11)  . Arthritis    right knee  . Bleeding ulcer    hx of, required 6 units blood  . Blood transfusion    6 units with ulcer repair surgery  . Chronic kidney disease    stage IV, has fistula, never started diaylsis  . Complication of anesthesia    little slow to wake up with last surgery  . Dyspnea    with exertion  . Gout   . Hypertension   . Osteoporosis   . Pneumonia     Patient Active Problem List   Diagnosis Date Noted  . Diverticula of colon 04/29/2020  . Acute renal failure superimposed on chronic kidney disease ([place]) 04/29/2020  . Diverticulitis   . Sinus tachycardia 10/21/2015  . DM (diabetes mellitus) ([place])  10/09/2011  . HTN (hypertension) 10/09/2011  . Post-traumatic osteoarthritis of left hip 10/06/2011    Past Surgical History:  Procedure Laterality Date  . AV FISTULA PLACEMENT Left 09/07/2016   Procedure: ARTERIOVENOUS (AV) FISTULA CREATION LEFT ARM;  Surgeon: Angelia Mould, MD;  Location: [facility];  Service: Vascular;  Laterality: Left;  . COLONOSCOPY    . EXTERNAL EAR SURGERY    . HIP PINNING  11/2010  . JOINT REPLACEMENT    . OTHER SURGICAL HISTORY  ~ 08/1998   repair bleeding ulcer  . SHOULDER ARTHROSCOPY WITH ROTATOR CUFF REPAIR AND SUBACROMIAL DECOMPRESSION Right 05/26/2017   Procedure: SHOULDER ARTHROSCOPY WITH ROTATOR CUFF REPAIR AND SUBACROMIAL DECOMPRESSION;  Surgeon: Tania Ade, MD;  Location: [facility];  Service: Orthopedics;  Laterality: Right;  SHOULDER ARTHROSCOPY WITH ROTATOR CUFF REPAIR AND SUBACROMIAL DECOMPRESSION  . TOTAL HIP ARTHROPLASTY  10/06/11   left  . TOTAL HIP ARTHROPLASTY  10/06/2011   Procedure: TOTAL HIP ARTHROPLASTY;  Surgeon: Kerin Salen;  Location: [facility];  Service: Orthopedics;  Laterality: Left;       Family History  Problem Relation Age of Onset  . Cancer Mother   . Atrial fibrillation Mother   . Hypertension Mother   . Cancer Father     Social  History   Tobacco Use  . Smoking status: Current Some Day Smoker    Packs/day: 0.25    Years: 32.00    Pack years: 8.00    Types: Cigarettes  . Smokeless tobacco: Former Systems developer    Types: Chew    Quit date: 36  . Tobacco comment: smoking cessation consult entered  Vaping Use  . Vaping Use: Never used  Substance Use Topics  . Alcohol use: Yes    Alcohol/week: 5.0 standard drinks    Types: 5 Cans of beer per week    Comment: occasional  . Drug use: No    Home Medications Prior to Admission medications   Medication Sig Start Date End Date Taking? Authorizing Provider  acetaminophen (TYLENOL) 325 MG tablet Take 325-650 mg by mouth every 6 (six) hours as needed for mild pain or  headache.    [provider]  allopurinol (ZYLOPRIM) 300 MG tablet Take 300 mg by mouth daily.      [provider]  aspirin EC 81 MG tablet Take 162 mg by mouth as needed (for headaches). Swallow whole    [provider]  carvedilol (COREG) 25 MG tablet Take 25 mg by mouth in the morning.     [provider]  ferrous sulfate 325 (65 FE) MG tablet Take 1 tablet (325 mg total) by mouth daily with breakfast. 05/03/20 08/01/20  Kayleen Memos, DO  loratadine (CLARITIN) 10 MG tablet Take 10 mg by mouth daily as needed for allergies or rhinitis.    [provider]  Multiple Vitamins-Minerals (ONE-A-DAY MENS 50+) TABS Take 1 tablet by mouth daily with breakfast.    [provider]  SENSIPAR 30 MG tablet Take 30 mg by mouth daily. Patient not taking: Reported on 04/29/2020 05/13/15   [provider]  sodium bicarbonate 650 MG tablet Take 650 mg by mouth 3 (three) times daily.  05/16/15   [provider]  tetrahydrozoline 0.05 % ophthalmic solution Place 1 drop into both eyes as needed (allergies and redness).     [provider]    Allergies    Penicillins  Review of Systems   Review of Systems  Musculoskeletal: Positive for arthralgias and gait problem.  All other systems reviewed and are negative.   Physical Exam Updated Vital Signs BP (!) 148/109   Pulse (!) 126   Temp 100.2 F (37.9 C) (Rectal)   Resp (!) 22   Ht 5\' 9"  (1.753 m)   Wt 115.7 kg   SpO2 100%   BMI 37.66 kg/m   Physical Exam Vitals and nursing note reviewed.  Constitutional:      General: He is not in acute distress.    Appearance: He is well-developed.  HENT:     Head: Normocephalic and atraumatic.     Right Ear: External ear normal.     Left Ear: External ear normal.     Nose: Nose normal.  Eyes:     General: No scleral icterus.    Conjunctiva/sclera: Conjunctivae normal.  Cardiovascular:     Rate and Rhythm: Regular rhythm.  Tachycardia present.     Heart sounds: Normal heart sounds.     Comments: Heart rate in the 120s.  Sounds regular.  No murmurs.  1+ DP and PT pulses bilaterally.  No lower extremity edema or calf tenderness. Pulmonary:     Effort: Pulmonary effort is normal.     Comments: Diminished lung sounds in lower lobes bilaterally, right greater than left.  Tachypneic.  No severe increased work of breathing. Abdominal:     Palpations: Abdomen is soft.     Tenderness: There is no abdominal tenderness.  Musculoskeletal:        General: Tenderness present. No deformity. Normal range of motion.     Cervical back: Normal range of motion and neck supple.     Comments: Left leg shortening and rotation.  Diffuse left inguinal crease tenderness and anterior/lateral hip tenderness.  Range of motion of the left hip deferred due to questionable injury.  Skin:    General: Skin is warm and dry.     Capillary Refill: Capillary refill takes less than 2 seconds.  Neurological:     Mental Status: He is alert and oriented to person, place, and time.     Comments: Sensation in the upper and lower extremities and face intact.  Hand grip and ankle flexion/extension bilaterally 5/5.  Speech is clear.  Psychiatric:        Behavior: Behavior normal.        Thought Content: Thought content normal.        Judgment: Judgment normal.     ED Results / Procedures / Treatments   Labs (all labs ordered are listed, but only abnormal results are displayed) Labs Reviewed  CBC WITH DIFFERENTIAL/PLATELET - Abnormal; Notable for the following components:      Result Value   WBC 12.0 (*)    RBC 2.74 (*)    Hemoglobin 9.1 (*)    HCT 28.1 (*)    MCV 102.6 (*)    Neutro Abs 9.8 (*)    Abs Immature Granulocytes 0.10 (*)    All other components within normal limits  COMPREHENSIVE METABOLIC PANEL - Abnormal; Notable for the following components:   CO2 16 (*)    Glucose, Bld 109 (*)    BUN 112 (*)    Creatinine, Ser 4.93 (*)     Calcium 12.3 (*)    Total Protein 8.7 (*)    Albumin 3.2 (*)    AST 45 (*)    GFR calc non Af Amer 12 (*)    GFR calc Af Amer 14 (*)    Anion gap 18 (*)    All other components within normal limits  URINALYSIS, ROUTINE W REFLEX MICROSCOPIC - Abnormal; Notable for the following components:   Hgb urine dipstick MODERATE (*)    Protein, ur 100 (*)    All other components within normal limits  CK - Abnormal; Notable for the following components:   Total CK 958 (*)    All other components within normal limits  URINE CULTURE  SARS CORONAVIRUS 2 BY RT PCR (HOSPITAL ORDER, [facility])  CULTURE, BLOOD (ROUTINE X 2)  CULTURE, BLOOD (ROUTINE X 2)  MAGNESIUM  TSH  LACTIC ACID, PLASMA  LACTIC ACID, PLASMA    EKG EKG Interpretation  Date/Time:  Saturday July 26 2020 12:32:18 EDT Ventricular Rate:  125 PR Interval:    QRS Duration: 111 QT Interval:  330 QTC Calculation: 472 R Axis:   -5 Text Interpretation: Sinus tachycardia Ventricular premature complex No significant change since last tracing Confirmed by Lennice Sites [phone]) on 07/26/2020 1:15:25 PM   Radiology DG Lumbar Spine Complete  Result Date: 07/26/2020 CLINICAL DATA:  Pain after fall.  Hip deformity. EXAM: LUMBAR SPINE - COMPLETE 4+ VIEW COMPARISON:  None. FINDINGS: Patient is status post left hip replacement. On limited images, there is a dislocation of the femoral component  versus the acetabular component. Pelvic bones otherwise normal. Multilevel degenerative disc disease. No fracture or malalignment. Lower lumbar facet degenerative changes noted. IMPRESSION: 1. Left hip dislocation.  See the dedicated left hip films. 2. Degenerative disc disease and lower lumbar facet degenerative changes in the lumbar spine. 3. No fracture or traumatic malalignment. Electronically Signed   By: Dorise Bullion III M.D   On: 07/26/2020 14:02   CT Head Wo Contrast  Result Date: 07/26/2020 CLINICAL DATA:   Status post fall. EXAM: CT HEAD WITHOUT CONTRAST TECHNIQUE: Contiguous axial images were obtained from the base of the skull through the vertex without intravenous contrast. COMPARISON:  None. FINDINGS: Brain: There is mild cerebral atrophy with widening of the extra-axial spaces and ventricular dilatation. There are areas of decreased attenuation within the white matter tracts of the supratentorial brain, consistent with microvascular disease changes. Vascular: No hyperdense vessel or unexpected calcification. Skull: Normal. Negative for fracture or focal lesion. Sinuses/Orbits: No acute finding. Other: Mild posterolateral scalp soft tissue swelling is seen along the vertex on the right. IMPRESSION: 1. Mild cerebral atrophy. 2. No acute intracranial abnormality. Electronically Signed   By: Virgina Norfolk M.D.   On: 07/26/2020 15:23   CT Cervical Spine Wo Contrast  Result Date: 07/26/2020 CLINICAL DATA:  Status post fall. EXAM: CT CERVICAL SPINE WITHOUT CONTRAST TECHNIQUE: Multidetector CT imaging of the cervical spine was performed without intravenous contrast. Multiplanar CT image reconstructions were also generated. COMPARISON:  None. FINDINGS: Alignment: Normal. Skull base and vertebrae: No acute fracture. No primary bone lesion or focal pathologic process. Chronic changes are seen along the anterior arch of the C1 vertebral body. Soft tissues and spinal canal: No prevertebral fluid or swelling. No visible canal hematoma. Disc levels: Moderate severity endplate sclerosis is seen at the level of C6-C7. There is moderate severity narrowing of the anterior atlantoaxial articulation. Marked severity intervertebral disc space narrowing is seen at this level. Mild intervertebral disc space narrowing is noted at the level of C7-T1. Mild, bilateral multilevel facet joint hypertrophy is noted. Upper chest: Negative. Other: None. IMPRESSION: 1. No acute osseous abnormality. 2. Marked severity degenerative disc  disease at the level of C6-C7. Electronically Signed   By: Virgina Norfolk M.D.   On: 07/26/2020 15:29   DG Chest Portable 1 View  Result Date: 07/26/2020 CLINICAL DATA:  Low-grade fever and fall. EXAM: PORTABLE CHEST 1 VIEW COMPARISON:  10/12/2011 FINDINGS: Lordotic technique is demonstrated. Patient is rotated to the right. Lungs are adequately inflated with opacification over the right midlung which may be due to atelectasis or infection. No effusion. Stable cardiomegaly. Remainder of the exam is unchanged. IMPRESSION: 1. Opacification over the right midlung which may be due to atelectasis or infection. 2. Stable cardiomegaly. Electronically Signed   By: Marin Olp M.D.   On: 07/26/2020 14:30   DG Hip Unilat W or Wo Pelvis 2-3 Views Left  Result Date: 07/26/2020 CLINICAL DATA:  59 year old male with a history of fall EXAM: DG HIP (WITH OR WITHOUT PELVIS) 2-3V LEFT COMPARISON:  CT 04/29/2020 FINDINGS: Posterior dislocation of the left hip arthroplasty. No fracture identified in the proximal femur. No fracture identified at the pelvis. Osteopenia. IMPRESSION: Posterior dislocation of the left hip arthroplasty components Electronically Signed   By: Corrie Mckusick D.O.   On: 07/26/2020 14:03    Procedures Procedures (including critical care time)  Medications Ordered in ED Medications  cefTRIAXone (ROCEPHIN) 1 g in sodium chloride 0.9 % 100 mL IVPB (has no administration  in time range)  doxycycline (VIBRA-TABS) tablet 100 mg (has no administration in time range)  fentaNYL (SUBLIMAZE) injection 100 mcg (has no administration in time range)  acetaminophen (TYLENOL) tablet 650 mg (has no administration in time range)  sodium chloride 0.9 % bolus 1,000 mL (has no administration in time range)    ED Course  I have reviewed the triage vital signs and the nursing notes.  Pertinent labs & imaging results that were available during my care of the patient were reviewed by me and considered in my  medical decision making (see chart for details).  Clinical Course as of Jul 26 1541  Sat Jul 26, 2020  1351 Pulse Rate(!): 126 [CG]  1351 Temp: 100.2 F (37.9 C) [CG]  1351 MAP (mmHg): 118 [CG]  1351 BP(!): 148/109 [CG]  1351 Sinus tachy, PVC, HR 120s   EKG 12-Lead [CG]  1351 Protein(!): 100 [CG]  1351 Bacteria, UA: NONE SEEN [CG]  1454 1. Opacification over the right midlung which may be due to atelectasis or infection. 2. Stable cardiomegaly.  DG Chest Portable 1 View [CG]  9937 IMPRESSION: Posterior dislocation of the left hip arthroplasty components  DG Hip Unilat W or Wo Pelvis 2-3 Views Left [CG]  1454 Non acute lumbar x-ray, left hip dislocation   DG Lumbar Spine Complete [CG]  1455 WBC(!): 12.0 [CG]  1455 Hemoglobin(!): 9.1 [CG]  1455 Better than baseline ~7   Hemoglobin(!): 9.1 [CG]  1503 At baseline   Creatinine(!): 4.93 [CG]  1503 CO2(!): 16 [CG]  1503 Anion gap(!): 18 [CG]  1526 Talked to Dr Lynann Bologna with ortho, appreciate his help in managing this patient    [CG]    Clinical Course User Index [CG] Arlean Hopping   MDM Rules/Calculators/A&P                          Patient's EMR, triage nursing notes reviewed to assist with history and MDM.  Additional information obtained from EMS.  59 year old male presents for evaluation of left hip pain after mechanical fall close to 36 hours ago.  Has been laying on his kitchen floor since.  Known left hip replacement by Dr. Mayer Camel in 2012.  Patient arrives with low-grade rectal temp 100.2, tachycardic in the 120s, tachypneic.  Hemodynamically stable.  No hypoxia.  Exam concerning for left hip injury.  Diminished lower lobe air sounds bilaterally.   ER work-up including lab work, traumatic imaging, EKG ordered by me.  ER work-up personally visualized and interpreted by me.  Patient meets SIRS criteria with tachycardia pulse rate 126, tachypnea respirations 26 and leukocytosis WBC 12.0.  Chest x-ray shows large  area of right mid lung atelectasis versus infection.  He denies cough, chest pain or shortness of breath.  Denies recent vomiting or possible aspiration.  However given SIRS criteria, CXR appearance, will obtain lactic acid, blood cultures and give IV antibiotics for suspected pulmonary infectious source.  He reports dysuria but UA is not consistent with infection.  Covid test is still pending however he is fully vaccinated.  SIRS criteria without hypotension, pending labs.  No hypoxia. Will give 1 L IVF.   His creatinine appears to be at his baseline, only slightly worse.  CK is 958.  We will give tylenol.   Pelvis x-ray confirms posterior left hip displacement.  I have contacted orthopedist Dr. Lynann Bologna, appreciate his assistance with this patient.  Reduction will be best done in controlled setting given  prolonged dislocation, ongoing medical findings suggestive of SIRS. Ortho to evaluate. Will order fentanyl.  1535: We will discus    s with medicine and request admission for Sirs response, source possibly pulmonary, mild AKI, trending of CK, left hip dislocation management per Ortho.  Shared with EDP.  Final Clinical Impression(s) / ED Diagnoses Final diagnoses:  Sepsis, due to unspecified organism, unspecified whether acute organ dysfunction present [facility])  Non-traumatic rhabdomyolysis  Dislocation, hip closed, left, initial encounter [facility])    Rx / DC Orders ED Discharge Orders    None       Kinnie Feil, PA-C 07/26/20 1542    Lennice Sites, DO 07/26/20 1544

## 2020-07-26 NOTE — ED Triage Notes (Signed)
PT here via [facility] after slipping on water and falling on his kitchen floor.  Fall occurred Friday am at 0700 and pt was finally able to crawl across the floor and call 911 today.  EMS states pt fell on L hip and did not hit head.  No LOC, though pt unable to place any weight on L hip.  Laid on R hip for over 24 hours.  Given 100 mcg fentanyl en-route that brought pain from 10 to 9.  VS stable except for hr that is 128.

## 2020-07-26 NOTE — ED Provider Notes (Signed)
Medical screening examination/treatment/procedure(s) were conducted as a shared visit with non-physician practitioner(s) and myself.  I personally evaluated the patient during the encounter. Briefly, the patient is a 59 y.o. male with history of hypertension, CKD who presents to the ED after fall.  Patient tachycardic, low-grade fever.  Patient with what sounds like mechanical fall that occurred at 7 AM yesterday.  Patient was on the ground for over 30 hours.  Was having severe left hip pain.  Has had this replaced in the past.  X-ray shows posterior hip dislocation.  He is neurovascularly intact.  He appears deconditioned.  Concern because he has had dysuria and now low-grade temperature and tachycardia.  Possibly sepsis.  Sepsis work-up was initiated.  Patient with chest x-ray concerning for infection versus atelectasis.  There is fair amount of atelectasis or infection in the right lower lobe.  He does have a white count.  CK is elevated 2000.  Creatinine appears to be mildly above baseline.  Urinalysis negative for infection.  CT scan of the head overall unremarkable.  Overall concern for sepsis likely respiratory source may be from aspiration, could be atelectasis.  However broad-spectrum IV antibiotics are started.  He appears to be a very poor candidate for ER sedation given chest x-ray, prolonged downtime and the fact that hip has been dislocated for almost 36 hours.  Talked with Dr. Roxy Horseman with orthopedics and they will arrange to take patient to the OR for reduction which is likely safest for patient given his current state.  Will admit the patient to medicine for further sepsis work-up and hydration and likely PT OT.  This chart was dictated using voice recognition software.  Despite best efforts to proofread,  errors can occur which can change the documentation meaning.     EKG Interpretation  Date/Time:  Saturday July 26 2020 12:32:18 EDT Ventricular Rate:  125 PR Interval:    QRS  Duration: 111 QT Interval:  330 QTC Calculation: 472 R Axis:   -5 Text Interpretation: Sinus tachycardia Ventricular premature complex No significant change since last tracing Confirmed by Lennice Sites [phone]) on 07/26/2020 1:15:25 PM           Lennice Sites, DO 07/26/20 1533

## 2020-07-27 ENCOUNTER — Observation Stay (HOSPITAL_COMMUNITY): Payer: 59 | Admitting: Certified Registered Nurse Anesthetist

## 2020-07-27 ENCOUNTER — Observation Stay (HOSPITAL_COMMUNITY): Payer: 59

## 2020-07-27 ENCOUNTER — Encounter (HOSPITAL_COMMUNITY): Payer: Self-pay | Admitting: Family Medicine

## 2020-07-27 ENCOUNTER — Encounter (HOSPITAL_COMMUNITY)
Admission: EM | Disposition: A | Discharge status: Skilled Nursing Facility | Payer: Self-pay | Source: Home / Self Care | Attending: Internal Medicine

## 2020-07-27 DIAGNOSIS — S73005D Unspecified dislocation of left hip, subsequent encounter: Secondary | ICD-10-CM | POA: Diagnosis not present

## 2020-07-27 DIAGNOSIS — I5033 Acute on chronic diastolic (congestive) heart failure: Secondary | ICD-10-CM | POA: Diagnosis not present

## 2020-07-27 DIAGNOSIS — Z419 Encounter for procedure for purposes other than remedying health state, unspecified: Secondary | ICD-10-CM

## 2020-07-27 DIAGNOSIS — I10 Essential (primary) hypertension: Secondary | ICD-10-CM | POA: Diagnosis not present

## 2020-07-27 DIAGNOSIS — N184 Chronic kidney disease, stage 4 (severe): Secondary | ICD-10-CM | POA: Diagnosis not present

## 2020-07-27 DIAGNOSIS — S73005A Unspecified dislocation of left hip, initial encounter: Secondary | ICD-10-CM | POA: Diagnosis not present

## 2020-07-27 HISTORY — PX: HIP CLOSED REDUCTION: SHX983

## 2020-07-27 LAB — ECHOCARDIOGRAM COMPLETE
AR max vel: 2.47 cm2
AV Area VTI: 2.58 cm2
AV Area mean vel: 2.26 cm2
AV Mean grad: 4.2 mmHg
AV Peak grad: 8.1 mmHg
Ao pk vel: 1.42 m/s
Area-P 1/2: 4.07 cm2
Height: 69 in
S' Lateral: 3.6 cm
Weight: 4080 oz

## 2020-07-27 LAB — HIV ANTIBODY (ROUTINE TESTING W REFLEX): HIV Screen 4th Generation wRfx: NONREACTIVE

## 2020-07-27 LAB — TROPONIN I (HIGH SENSITIVITY)
Troponin I (High Sensitivity): 77 ng/L — ABNORMAL HIGH (ref <18)
Troponin I (High Sensitivity): 86 ng/L — ABNORMAL HIGH (ref <18)

## 2020-07-27 LAB — SURGICAL PCR SCREEN
MRSA, PCR: NEGATIVE
Staphylococcus aureus: NEGATIVE

## 2020-07-27 LAB — CK: Total CK: 690 U/L — ABNORMAL HIGH (ref 49–397)

## 2020-07-27 SURGERY — CLOSED REDUCTION, HIP
Anesthesia: General | Site: Hip | Laterality: Left

## 2020-07-27 MED ORDER — MIDAZOLAM HCL 2 MG/2ML IJ SOLN
INTRAMUSCULAR | Status: AC
  Filled 2020-07-27: qty 2

## 2020-07-27 MED ORDER — SODIUM CHLORIDE 0.9 % IV SOLN: INTRAVENOUS | Status: DC

## 2020-07-27 MED ORDER — FENTANYL CITRATE (PF) 250 MCG/5ML IJ SOLN
INTRAMUSCULAR | Status: AC
  Filled 2020-07-27: qty 5

## 2020-07-27 MED ORDER — DEXAMETHASONE SODIUM PHOSPHATE 10 MG/ML IJ SOLN
INTRAMUSCULAR | Status: DC | PRN
  Administered 2020-07-27: 5 mg via INTRAVENOUS

## 2020-07-27 MED ORDER — OXYCODONE HCL 5 MG/5ML PO SOLN: 5.0000 mg | Freq: Once | ORAL | Status: DC | PRN

## 2020-07-27 MED ORDER — SUCCINYLCHOLINE CHLORIDE 200 MG/10ML IV SOSY
PREFILLED_SYRINGE | INTRAVENOUS | Status: DC | PRN
  Administered 2020-07-27: 100 mg via INTRAVENOUS

## 2020-07-27 MED ORDER — METOPROLOL TARTRATE 25 MG PO TABS
25.0000 mg | ORAL_TABLET | Freq: Two times a day (BID) | ORAL | Status: DC
  Administered 2020-07-27 – 2020-07-28 (×3): 25 mg via ORAL
  Filled 2020-07-27 (×3): qty 1

## 2020-07-27 MED ORDER — ONDANSETRON HCL 4 MG/2ML IJ SOLN
INTRAMUSCULAR | Status: DC | PRN
  Administered 2020-07-27: 4 mg via INTRAVENOUS

## 2020-07-27 MED ORDER — PERFLUTREN LIPID MICROSPHERE
1.0000 mL | INTRAVENOUS | Status: AC | PRN
  Administered 2020-07-27: 3 mL via INTRAVENOUS
  Filled 2020-07-27: qty 10

## 2020-07-27 MED ORDER — ROCURONIUM BROMIDE 10 MG/ML (PF) SYRINGE
PREFILLED_SYRINGE | INTRAVENOUS | Status: DC | PRN
  Administered 2020-07-27: 20 mg via INTRAVENOUS

## 2020-07-27 MED ORDER — CHLORHEXIDINE GLUCONATE 0.12 % MT SOLN
OROMUCOSAL | Status: AC
  Administered 2020-07-27: 15 mL via OROMUCOSAL
  Filled 2020-07-27: qty 15

## 2020-07-27 MED ORDER — PROPOFOL 10 MG/ML IV BOLUS
INTRAVENOUS | Status: DC | PRN
  Administered 2020-07-27: 130 mg via INTRAVENOUS

## 2020-07-27 MED ORDER — FENTANYL CITRATE (PF) 250 MCG/5ML IJ SOLN
INTRAMUSCULAR | Status: DC | PRN
Start: 2020-07-27 — End: 2020-07-27
  Administered 2020-07-27: 50 ug via INTRAVENOUS

## 2020-07-27 MED ORDER — OXYCODONE HCL 5 MG PO TABS: 5.0000 mg | ORAL_TABLET | Freq: Once | ORAL | Status: DC | PRN

## 2020-07-27 MED ORDER — APIXABAN 5 MG PO TABS
5.0000 mg | ORAL_TABLET | Freq: Two times a day (BID) | ORAL | Status: DC
  Administered 2020-07-27 – 2020-08-01 (×10): 5 mg via ORAL
  Filled 2020-07-27 (×11): qty 1

## 2020-07-27 MED ORDER — LIDOCAINE 2% (20 MG/ML) 5 ML SYRINGE
INTRAMUSCULAR | Status: DC | PRN
  Administered 2020-07-27: 40 mg via INTRAVENOUS

## 2020-07-27 MED ORDER — PROPOFOL 10 MG/ML IV BOLUS
INTRAVENOUS | Status: AC
  Filled 2020-07-27: qty 40

## 2020-07-27 MED ORDER — ORAL CARE MOUTH RINSE: 15.0000 mL | Freq: Once | OROMUCOSAL | Status: AC

## 2020-07-27 MED ORDER — MIDAZOLAM HCL 2 MG/2ML IJ SOLN
INTRAMUSCULAR | Status: DC | PRN
  Administered 2020-07-27: 2 mg via INTRAVENOUS

## 2020-07-27 MED ORDER — SUGAMMADEX SODIUM 200 MG/2ML IV SOLN
INTRAVENOUS | Status: DC | PRN
  Administered 2020-07-27: 200 mg via INTRAVENOUS

## 2020-07-27 MED ORDER — ONDANSETRON HCL 4 MG/2ML IJ SOLN: 4.0000 mg | Freq: Once | INTRAMUSCULAR | Status: DC | PRN

## 2020-07-27 MED ORDER — CHLORHEXIDINE GLUCONATE 0.12 % MT SOLN: 15.0000 mL | Freq: Once | OROMUCOSAL | Status: AC

## 2020-07-27 MED ORDER — FENTANYL CITRATE (PF) 100 MCG/2ML IJ SOLN: 25.0000 ug | INTRAMUSCULAR | Status: DC | PRN

## 2020-07-27 SURGICAL SUPPLY — 1 items: DRSG MEPILEX BORDER 4X4 (GAUZE/BANDAGES/DRESSINGS) ×6 IMPLANT

## 2020-07-27 NOTE — Evaluation (Signed)
Physical Therapy Evaluation Patient Details Name: Mark [name] MRN: [id number] DOB: 05-03-1961 Today's Date: 07/27/2020   History of Present Illness  Patient is a 59 y/o male who presents with left hip dislocation s/p fall. Admitted with sepsus and PNA now s/p left hip closed reduction 9/19. New onser A-flutter. PMH includes CKD, HTN, osteoporosis, tobacco use and arthritis.  Clinical Impression  Patient presents with lethargy, generalized weakness, post surgical deficits LLE, pain and impaired mobility s/p above. Pt lives alone and needs to negotiate stairs to enter home. Today, pt requires Max A for bed mobility, Mod-Max A for standing transfers and Min A-min guard for gait training with use of RW for support. HR stayed between 114-118 bpm with activity. Pt is a high fall risk at this time and does not have any support at home. Would benefit from post acute rehab to maximize independence and mobility prior to return home. Will follow acutely.    Follow Up Recommendations SNF;Supervision for mobility/OOB    Equipment Recommendations  None recommended by PT    Recommendations for Other Services       Precautions / Restrictions Precautions Precautions: Fall Required Braces or Orthoses: Knee Immobilizer - Left Knee Immobilizer - Left: On at all times Restrictions Weight Bearing Restrictions: Yes LLE Weight Bearing: Weight bearing as tolerated      Mobility  Bed Mobility Overal bed mobility: Needs Assistance Bed Mobility: Supine to Sit     Supine to sit: HOB elevated;Max assist     General bed mobility comments: Assist with trunk and LLE to get to EOB, increased time and use of rail.  Transfers Overall transfer level: Needs assistance Equipment used: Rolling walker (2 wheeled) Transfers: Sit to/from Stand Sit to Stand: Mod assist;Max assist         General transfer comment: Heavy Mod A to stand from elevated bed height with cues for hand placement, stood from EOB x1,  from toilet x1 with Max A of 2.  Ambulation/Gait Ambulation/Gait assistance: Min assist Gait Distance (Feet): 18 Feet (x2 bouts) Assistive device: Rolling walker (2 wheeled) Gait Pattern/deviations: Step-through pattern;Decreased stance time - left;Decreased step length - right;Wide base of support Gait velocity: decreased Gait velocity interpretation: <1.31 ft/sec, indicative of household ambulator General Gait Details: SLow, mildly unsteady gait with increased WB through BUEs. Difficulty advancing LLE initially- improved with distance.  Stairs            Wheelchair Mobility    Modified Rankin (Stroke Patients Only)       Balance Overall balance assessment: Needs assistance Sitting-balance support: Feet supported;No upper extremity supported Sitting balance-Leahy Scale: Fair     Standing balance support: During functional activity Standing balance-Leahy Scale: Poor Standing balance comment: Requires UE support in standing.                             Pertinent Vitals/Pain Pain Assessment: Faces Faces Pain Scale: Hurts even more Pain Location: left hip Pain Descriptors / Indicators: Sore;Operative site guarding Pain Intervention(s): Monitored during session;Repositioned    Home Living Family/patient expects to be discharged to:: Private residence Living Arrangements: Alone Available Help at Discharge:  (brother can help but not physically as he uses a RW at baseline) Type of Home: House Home Access: Stairs to enter Entrance Stairs-Rails: None Entrance Stairs-Number of Steps: 3 Home Layout: One level Home Equipment: Grab bars - tub/shower;Shower seat;Cane - quad;Cane - single point;Walker - 2 wheels;Bedside commode  Prior Function Level of Independence: Independent with assistive device(s)         Comments: Uses SPC as needed for stairs and at times for ambulation. Drives.     Hand Dominance   Dominant Hand: Right    Extremity/Trunk  Assessment   Upper Extremity Assessment Upper Extremity Assessment: Defer to OT evaluation    Lower Extremity Assessment Lower Extremity Assessment: Generalized weakness LLE Deficits / Details: LLE locked into extension with brace, difficulty advancing LLE during gait LLE Sensation: WNL    Cervical / Trunk Assessment Cervical / Trunk Assessment: Normal  Communication   Communication: No difficulties  Cognition Arousal/Alertness: Lethargic Behavior During Therapy: WFL for tasks assessed/performed Overall Cognitive Status: Within Functional Limits for tasks assessed                                 General Comments: needs further assessment, appears [facility] for basic tasks today but pt also lethargic.      General Comments General comments (skin integrity, edema, etc.): Brother present during session.    Exercises     Assessment/Plan    PT Assessment Patient needs continued PT services  PT Problem List Decreased strength;Decreased mobility;Decreased range of motion;Decreased knowledge of precautions;Obesity;Pain;Decreased balance;Decreased activity tolerance;Decreased skin integrity       PT Treatment Interventions Therapeutic activities;Gait training;Therapeutic exercise;Patient/family education;Stair training;Balance training;Functional mobility training    PT Goals (Current goals can be found in the Care Plan section)  Acute Rehab PT Goals Patient Stated Goal: to get to the bathroom PT Goal Formulation: With patient Time For Goal Achievement: 08/10/20 Potential to Achieve Goals: Good    Frequency Min 3X/week   Barriers to discharge Decreased caregiver support;Inaccessible home environment stairs to enter home and lives alone    Co-evaluation               AM-PAC PT "6 Clicks" Mobility  Outcome Measure Help needed turning from your back to your side while in a flat bed without using bedrails?: A Lot Help needed moving from lying on your back to  sitting on the side of a flat bed without using bedrails?: Total Help needed moving to and from a bed to a chair (including a wheelchair)?: A Lot Help needed standing up from a chair using your arms (e.g., wheelchair or bedside chair)?: A Lot Help needed to walk in hospital room?: A Little Help needed climbing 3-5 steps with a railing? : A Lot 6 Click Score: 12    End of Session Equipment Utilized During Treatment: Gait belt Activity Tolerance: Patient tolerated treatment well Patient left: in bed;with call bell/phone within reach;with family/visitor present;Other (comment) (ECHO tech present) Nurse Communication: Mobility status PT Visit Diagnosis: Pain;Difficulty in walking, not elsewhere classified (R26.2);Muscle weakness (generalized) (M62.81) Pain - Right/Left: Left Pain - part of body: Hip    Time: 4098-1191 PT Time Calculation (min) (ACUTE ONLY): 35 min   Charges:   PT Evaluation $PT Eval Moderate Complexity: 1 Mod PT Treatments $Therapeutic Activity: 8-22 mins        Marisa Severin, PT, DPT Acute Rehabilitation Services Pager [phone] Office [phone]      Marguarite Arbour A Sabra Heck 07/27/2020, 3:14 PM

## 2020-07-27 NOTE — Progress Notes (Signed)
Orthopedic Tech Progress Note Patient Details:  Mark [name] Feb 17, 1961 [id number] Spoke with RN about patient needing [place] with [place]. The ones in house max pull down weight is 250 lbs. Patient weighs 255.7. liability standpoint I cant apply. So I told RN he would have to order patient an bariatric bed with TRAPEZE   Patient ID: Mark [name], male   DOB: [birth year], 59 y.o.   MRN: [id number]   [name] 07/27/2020, 10:31 AM

## 2020-07-27 NOTE — Progress Notes (Signed)
   07/26/20 2000  Assess: MEWS Score  Temp 99.6 F (37.6 C)  BP (!) 142/107  Pulse Rate (!) 125  ECG Heart Rate (!) 126  Resp 17  Level of Consciousness Alert  SpO2 100 %  O2 Device Room Air  Assess: MEWS Score  MEWS Temp 0  MEWS Systolic 0  MEWS Pulse 2  MEWS RR 0  MEWS LOC 0  MEWS Score 2  MEWS Score Color Yellow  Assess: if the MEWS score is Yellow or Red  Were vital signs taken at a resting state? Yes  Focused Assessment No change from prior assessment  Early Detection of Sepsis Score *See Row Information* Low  MEWS guidelines implemented *See Row Information* No, previously yellow, continue vital signs every 4 hours  Treat  MEWS Interventions Administered scheduled meds/treatments  Pain Scale 0-10  Pain Score 4  Take Vital Signs  Increase Vital Sign Frequency  Yellow: Q 2hr X 2 then Q 4hr X 2, if remains yellow, continue Q 4hrs  Escalate  MEWS: Escalate Yellow: discuss with charge nurse/RN and consider discussing with provider and RRT  Notify: Charge Nurse/RN  Name of Charge Nurse/RN Notified [place] B  Date Charge Nurse/RN Notified 07/26/20  Time Charge Nurse/RN Notified 2009  Notify: Provider  Provider Name/Title [place]  Date Provider Notified 07/19/20  Time Provider Notified 2125  Notification Type Page  Notification Reason Other (Comment)  Response See new orders  Date of Provider Response 07/26/20  Time of Provider Response 2131  Document  Patient Outcome Stabilized after interventions  Progress note created (see row info) Yes

## 2020-07-27 NOTE — Progress Notes (Signed)
°  Echocardiogram 2D Echocardiogram has been attempted. Patient eating. Will reattempt at later time.  Mark [name] 07/27/2020, 12:55 PM

## 2020-07-27 NOTE — Consult Note (Signed)
ORTHOPAEDIC CONSULTATION  REQUESTING PHYSICIAN: Caren Griffins, MD  Chief Complaint: Left hip pain  HPI: Mark [name] is a 59 y.o. male who complains of acute left hip pain after he had a fall when he slipped on water.  This happened I believe Friday night, it was difficult for him to get to a phone, ultimately after a prolonged period of time he was able to get to a phone and got help.  Brought to the hospital and found to have a left hip dislocation.  He had a hip pinning that I did around 10 years ago, that ultimately was converted to a total hip replacement by Dr. Mayer Camel.  He reports that he has had progressive loss of strength in the left lower extremity, and has been using a cane in, and having increasing difficulty with balance as well as ambulating up and down stairs.  Pain is rated as moderate to severe over the left hip, unable to move.  Past Medical History:  Diagnosis Date  . Anemia    labs today indicate (09/27/11)  . Arthritis    right knee  . Bleeding ulcer    hx of, required 6 units blood  . Blood transfusion    6 units with ulcer repair surgery  . Chronic kidney disease    stage IV, has fistula, never started diaylsis  . Complication of anesthesia    little slow to wake up with last surgery  . Dyspnea    with exertion  . Gout   . Hypertension   . Osteoporosis   . Pneumonia    Past Surgical History:  Procedure Laterality Date  . AV FISTULA PLACEMENT Left 09/07/2016   Procedure: ARTERIOVENOUS (AV) FISTULA CREATION LEFT ARM;  Surgeon: Angelia Mould, MD;  Location: [facility];  Service: Vascular;  Laterality: Left;  . COLONOSCOPY    . EXTERNAL EAR SURGERY    . HIP PINNING  11/2010  . JOINT REPLACEMENT    . OTHER SURGICAL HISTORY  ~ 08/1998   repair bleeding ulcer  . SHOULDER ARTHROSCOPY WITH ROTATOR CUFF REPAIR AND SUBACROMIAL DECOMPRESSION Right 05/26/2017   Procedure: SHOULDER ARTHROSCOPY WITH ROTATOR CUFF REPAIR AND SUBACROMIAL DECOMPRESSION;   Surgeon: Tania Ade, MD;  Location: [facility];  Service: Orthopedics;  Laterality: Right;  SHOULDER ARTHROSCOPY WITH ROTATOR CUFF REPAIR AND SUBACROMIAL DECOMPRESSION  . TOTAL HIP ARTHROPLASTY  10/06/11   left  . TOTAL HIP ARTHROPLASTY  10/06/2011   Procedure: TOTAL HIP ARTHROPLASTY;  Surgeon: Kerin Salen;  Location: [facility];  Service: Orthopedics;  Laterality: Left;   Social History   Socioeconomic History  . Marital status: Single    Spouse name: Not on file  . Number of children: Not on file  . Years of education: Not on file  . Highest education level: Not on file  Occupational History  . Not on file  Tobacco Use  . Smoking status: Current Some Day Smoker    Packs/day: 0.25    Years: 32.00    Pack years: 8.00    Types: Cigarettes  . Smokeless tobacco: Former Systems developer    Types: Chew    Quit date: 71  . Tobacco comment: smoking cessation consult entered  Vaping Use  . Vaping Use: Never used  Substance and Sexual Activity  . Alcohol use: Yes    Alcohol/week: 5.0 standard drinks    Types: 5 Cans of beer per week    Comment: occasional  . Drug use: No  . Sexual activity:  Yes  Other Topics Concern  . Not on file  Social History Narrative  . Not on file   Social Determinants of Health   Financial Resource Strain:   . Difficulty of Paying Living Expenses: Not on file  Food Insecurity:   . Worried About Charity fundraiser in the Last Year: Not on file  . Ran Out of Food in the Last Year: Not on file  Transportation Needs:   . Lack of Transportation (Medical): Not on file  . Lack of Transportation (Non-Medical): Not on file  Physical Activity:   . Days of Exercise per Week: Not on file  . Minutes of Exercise per Session: Not on file  Stress:   . Feeling of Stress : Not on file  Social Connections:   . Frequency of Communication with Friends and Family: Not on file  . Frequency of Social Gatherings with Friends and Family: Not on file  . Attends Religious Services:  Not on file  . Active Member of Clubs or Organizations: Not on file  . Attends Archivist Meetings: Not on file  . Marital Status: Not on file   Family History  Problem Relation Age of Onset  . Cancer Mother   . Atrial fibrillation Mother   . Hypertension Mother   . Cancer Father    Allergies  Allergen Reactions  . Penicillins Swelling, Rash and Other (See Comments)    Arms and eyes swell & skin breaks out  Has patient had a PCN reaction causing immediate rash, facial/tongue/throat swelling, SOB or lightheadedness with hypotension: Yes Has patient had a PCN reaction causing severe rash involving mucus membranes or skin necrosis: Yes Has patient had a PCN reaction that required hospitalization: No Has patient had a PCN reaction occurring within the last 10 years: No If all of the above answers are "NO", then may proceed with Cephalosporin use.      Positive ROS: All other systems have been reviewed and were otherwise negative with the exception of those mentioned in the HPI and as above.  Physical Exam: General: Alert, no acute distress Cardiovascular: No pedal edema Respiratory: No cyanosis, no use of accessory musculature GI: No organomegaly, abdomen is soft and non-tender Skin: No lesions in the area of chief complaint Neurologic: Sensation intact distally Psychiatric: Patient is competent for consent with normal mood and affect Lymphatic: No axillary or cervical lymphadenopathy  MUSCULOSKELETAL: Left leg has internal rotation deformity, EHL is present.  Assessment: Principal Problem:   Closed dislocation of left hip (HCC) Active Problems:   Benign essential HTN   Sepsis ([place])   CKD (chronic kidney disease), stage IV (HCC)   Gout   Pneumonia   Plan: Closed vs. open reduction left hip dislocation.  The risks benefits and alternatives were discussed with the patient including but not limited to the risks of nonoperative treatment, versus surgical  intervention including infection, bleeding, nerve injury, recurrent dislocation, the need for revision surgery, hardware failure, the need for hardware removal, blood clots, cardiopulmonary complications, morbidity, mortality, among others, and they were willing to proceed.       Johnny Bridge, MD Cell [phone]   07/27/2020 8:27 AM

## 2020-07-27 NOTE — Progress Notes (Signed)
PROGRESS NOTE  Mark [name] KYH:[id number] DOB: December 07, 1960 DOA: 07/26/2020 PCP: Delrae Rend, MD   LOS: 0 days   Brief Narrative / Interim history: 59 year old male with chronic kidney disease stage V followed by Dr. Posey Pronto came into the ER after a fall at home with inability to walk.  He was found to have dislocated left hip as well as possible sepsis and pneumonia.  Orthopedics were consulted to reduce the dislocation, and TRH admitted to the hospital.  Subjective / 24h Interval events: He is doing well, awaiting to go to the OR today to reduce the dislocation  Assessment & Plan: Principal Problem Close dislocation of the left hip -Following mechanical fall at home, orthopedic surgery consulted and he was taken to the OR this morning status post reduction.  Active Problems Sepsis due to community-acquired pneumonia -Patient met sepsis criteria with elevated WBC, fever, tachycardia, tachypnea and a source -Chest x-ray on admission concerning for right middle lobe pneumonia.  Patient tells me that he has had a cough for the past few days.  He was started on antibiotics with ceftriaxone and doxycycline, continue for now, will narrow to orals on discharge -Respiratory status is stable, he is on room air and clinically his sepsis physiology is improving  A flutter, new onset, CHA2DS2-VASc score is 1 for hypertension -Telemetry reviewed, he alternates between sinus tachycardia and a flutter with rates up to 140s overnight, case was discussed with Dr. Harl Bowie with cardiology.  He recommends change beta-blocker to metoprolol and recommending anticoagulation with Eliquis in case he will need to have cardioversion -Obtain a 2D echo, monitor overnight given change to metoprolol to ensure rate control -Cardiology will follow as an outpatient  Chronic kidney disease stage IV -He appears at baseline, continue furosemide, sodium bicarb  Essential hypertension -Monitor blood pressure on  metoprolol  Scheduled Meds: . allopurinol  300 mg Oral Daily  . doxycycline  100 mg Oral Q12H  . furosemide  40 mg Oral BID  . metoprolol tartrate  25 mg Oral BID  . sodium bicarbonate  650 mg Oral BID   Continuous Infusions: . cefTRIAXone (ROCEPHIN)  IV     PRN Meds:.acetaminophen **OR** acetaminophen, HYDROcodone-acetaminophen, metoprolol tartrate, oxyCODONE  Diet Orders (From admission, onward)    Start     Ordered   07/27/20 1028  Diet Heart Room service appropriate? No; Fluid consistency: Thin  Diet effective now       Question Answer Comment  Room service appropriate? No   Fluid consistency: Thin      07/27/20 1029          DVT prophylaxis: SCDs Start: 07/26/20 2029     Code Status: Full Code  Family Communication: brother at bedside   Status is: Observation  The patient will require care spanning > 2 midnights and should be moved to inpatient because: Requires adequate rate control for his a flutter prior to discharge and needs inpatient telemetry monitoring given medication changes and uncontrolled rates the night prior  Dispo: The patient is from: Home              Anticipated d/c is to: Home              Anticipated d/c date is: 1 day              Patient currently is not medically stable to d/c.  Consultants:  Orthopedic surgery   Procedures:  2D echo: pending  Microbiology  none  Antimicrobials: Ceftriaxone  Doxycycline  Objective: Vitals:   07/27/20 0930 07/27/20 0940 07/27/20 1035 07/27/20 1100  BP: (!) 157/81     Pulse: 88   92  Resp: 20  18 18   Temp:  97.7 F (36.5 C)    TempSrc:      SpO2: 96%   98%  Weight:      Height:        Intake/Output Summary (Last 24 hours) at 07/27/2020 1146 Last data filed at 07/27/2020 0400 Gross per 24 hour  Intake 972.56 ml  Output --  Net 972.56 ml   Filed Weights   07/26/20 1235  Weight: 115.7 kg    Examination:  Constitutional: NAD Eyes: no scleral icterus ENMT: Mucous membranes  are moist.  Neck: normal, supple Respiratory: Diminished at the bases but overall clear without wheezing or crackles Cardiovascular: Regular rate and rhythm, no murmurs / rubs / gallops. No LE edema.  Tachycardic Abdomen: non distended, no tenderness. Bowel sounds positive.  Musculoskeletal: no clubbing / cyanosis.  Skin: no rashes Neurologic: CN 2-12 grossly intact. Strength 5/5 in all 4.   Data Reviewed: I have independently reviewed following labs and imaging studies   CBC: Recent Labs  Lab 07/26/20 1409  WBC 12.0*  NEUTROABS 9.8*  HGB 9.1*  HCT 28.1*  MCV 102.6*  PLT 051   Basic Metabolic Panel: Recent Labs  Lab 07/26/20 1409  NA 139  K 4.5  CL 105  CO2 16*  GLUCOSE 109*  BUN 112*  CREATININE 4.93*  CALCIUM 12.3*  MG 2.0   Liver Function Tests: Recent Labs  Lab 07/26/20 1409  AST 45*  ALT 15  ALKPHOS 73  BILITOT 1.0  PROT 8.7*  ALBUMIN 3.2*   Coagulation Profile: No results for input(s): INR, PROTIME in the last 168 hours. HbA1C: No results for input(s): HGBA1C in the last 72 hours. CBG: No results for input(s): GLUCAP in the last 168 hours.  Recent Results (from the past 240 hour(s))  Urine culture     Status: Abnormal (Preliminary result)   Collection Time: 07/26/20 12:44 PM   Specimen: Urine, Random  Result Value Ref Range Status   Specimen Description URINE, RANDOM  Final   Special Requests NONE  Final   Culture (A)  Final    30,000 COLONIES/mL STAPHYLOCOCCUS EPIDERMIDIS SUSCEPTIBILITIES TO FOLLOW Performed at [facility], [address] [place], [place] [address]    Report Status PENDING  Incomplete  SARS Coronavirus 2 by RT PCR (hospital order, performed in [facility] hospital lab) Nasopharyngeal Nasopharyngeal Swab     Status: None   Collection Time: 07/26/20  2:12 PM   Specimen: Nasopharyngeal Swab  Result Value Ref Range Status   SARS Coronavirus 2 NEGATIVE NEGATIVE Final    Comment: (NOTE) SARS-CoV-2 target nucleic acids  are NOT DETECTED.  The SARS-CoV-2 RNA is generally detectable in upper and lower respiratory specimens during the acute phase of infection. The lowest concentration of SARS-CoV-2 viral copies this assay can detect is 250 copies / mL. A negative result does not preclude SARS-CoV-2 infection and should not be used as the sole basis for treatment or other patient management decisions.  A negative result may occur with improper specimen collection / handling, submission of specimen other than nasopharyngeal swab, presence of viral mutation(s) within the areas targeted by this assay, and inadequate number of viral copies (<250 copies / mL). A negative result must be combined with clinical observations, patient history, and epidemiological information.  Fact Sheet for Patients:  StrictlyIdeas.no  Fact Sheet for Healthcare Providers: BankingDealers.co.za  This test is not yet approved or  cleared by the [place] FDA and has been authorized for detection and/or diagnosis of SARS-CoV-2 by FDA under an Emergency Use Authorization (EUA).  This EUA will remain in effect (meaning this test can be used) for the duration of the COVID-19 declaration under Section 564(b)(1) of the Act, 21 U.S.C. section 360bbb-3(b)(1), unless the authorization is terminated or revoked sooner.  Performed at [facility], [place] [address], [place], [place] [address]   Blood culture (routine x 2)     Status: None (Preliminary result)   Collection Time: 07/26/20  3:02 PM   Specimen: BLOOD LEFT HAND  Result Value Ref Range Status   Specimen Description BLOOD LEFT HAND  Final   Special Requests   Final    BOTTLES DRAWN AEROBIC AND ANAEROBIC Blood Culture adequate volume Performed at [facility], [place] [address], [place], [place] [address]    Culture PENDING  Incomplete   Report Status PENDING  Incomplete  Surgical pcr screen     Status: None    Collection Time: 07/27/20  6:03 AM   Specimen: Nasal Mucosa; Nasal Swab  Result Value Ref Range Status   MRSA, PCR NEGATIVE NEGATIVE Final   Staphylococcus aureus NEGATIVE NEGATIVE Final    Comment: (NOTE) The Xpert SA Assay (FDA approved for NASAL specimens in patients 62 years of age and older), is one component of a comprehensive surveillance program. It is not intended to diagnose infection nor to guide or monitor treatment. Performed at [facility], [place] [address], [place], [place] [address]      Radiology Studies: DG Chest 2 View  Result Date: 07/27/2020 CLINICAL DATA:  Short of breath. EXAM: CHEST - 2 VIEW COMPARISON:  07/26/2020 FINDINGS: Stable cardiac enlargement. Persistent nodular opacity within the lateral right lung base, along the right border, is noted which may reflect atelectasis or airspace disease. Mild platelike atelectasis in the left midlung is new from previous exam. No findings of pleural effusion or interstitial edema. IMPRESSION: Persistent nodular opacity within the lateral right lung base which may reflect atelectasis or airspace disease. Continued follow-up imaging is advised to ensure resolution. If this does not improve thin more definitive characterization with CT of the chest would be recommended to assess for underlying lung nodule. Electronically Signed   By: Kerby Moors M.D.   On: 07/27/2020 08:57   DG Lumbar Spine Complete  Result Date: 07/26/2020 CLINICAL DATA:  Pain after fall.  Hip deformity. EXAM: LUMBAR SPINE - COMPLETE 4+ VIEW COMPARISON:  None. FINDINGS: Patient is status post left hip replacement. On limited images, there is a dislocation of the femoral component versus the acetabular component. Pelvic bones otherwise normal. Multilevel degenerative disc disease. No fracture or malalignment. Lower lumbar facet degenerative changes noted. IMPRESSION: 1. Left hip dislocation.  See the dedicated left hip films. 2. Degenerative disc disease  and lower lumbar facet degenerative changes in the lumbar spine. 3. No fracture or traumatic malalignment. Electronically Signed   By: Dorise Bullion III M.D   On: 07/26/2020 14:02   CT Head Wo Contrast  Result Date: 07/26/2020 CLINICAL DATA:  Status post fall. EXAM: CT HEAD WITHOUT CONTRAST TECHNIQUE: Contiguous axial images were obtained from the base of the skull through the vertex without intravenous contrast. COMPARISON:  None. FINDINGS: Brain: There is mild cerebral atrophy with widening of the extra-axial spaces and ventricular dilatation. There are  areas of decreased attenuation within the white matter tracts of the supratentorial brain, consistent with microvascular disease changes. Vascular: No hyperdense vessel or unexpected calcification. Skull: Normal. Negative for fracture or focal lesion. Sinuses/Orbits: No acute finding. Other: Mild posterolateral scalp soft tissue swelling is seen along the vertex on the right. IMPRESSION: 1. Mild cerebral atrophy. 2. No acute intracranial abnormality. Electronically Signed   By: Virgina Norfolk M.D.   On: 07/26/2020 15:23   CT Cervical Spine Wo Contrast  Result Date: 07/26/2020 CLINICAL DATA:  Status post fall. EXAM: CT CERVICAL SPINE WITHOUT CONTRAST TECHNIQUE: Multidetector CT imaging of the cervical spine was performed without intravenous contrast. Multiplanar CT image reconstructions were also generated. COMPARISON:  None. FINDINGS: Alignment: Normal. Skull base and vertebrae: No acute fracture. No primary bone lesion or focal pathologic process. Chronic changes are seen along the anterior arch of the C1 vertebral body. Soft tissues and spinal canal: No prevertebral fluid or swelling. No visible canal hematoma. Disc levels: Moderate severity endplate sclerosis is seen at the level of C6-C7. There is moderate severity narrowing of the anterior atlantoaxial articulation. Marked severity intervertebral disc space narrowing is seen at this level. Mild  intervertebral disc space narrowing is noted at the level of C7-T1. Mild, bilateral multilevel facet joint hypertrophy is noted. Upper chest: Negative. Other: None. IMPRESSION: 1. No acute osseous abnormality. 2. Marked severity degenerative disc disease at the level of C6-C7. Electronically Signed   By: Virgina Norfolk M.D.   On: 07/26/2020 15:29   DG Chest Portable 1 View  Result Date: 07/26/2020 CLINICAL DATA:  Low-grade fever and fall. EXAM: PORTABLE CHEST 1 VIEW COMPARISON:  10/12/2011 FINDINGS: Lordotic technique is demonstrated. Patient is rotated to the right. Lungs are adequately inflated with opacification over the right midlung which may be due to atelectasis or infection. No effusion. Stable cardiomegaly. Remainder of the exam is unchanged. IMPRESSION: 1. Opacification over the right midlung which may be due to atelectasis or infection. 2. Stable cardiomegaly. Electronically Signed   By: Marin Olp M.D.   On: 07/26/2020 14:30   DG C-Arm 1-60 Min  Result Date: 07/27/2020 CLINICAL DATA:  Post left hip reduction. EXAM: OPERATIVE LEFT HIP (WITH PELVIS IF PERFORMED) 2 VIEWS TECHNIQUE: Fluoroscopic spot image(s) were submitted for interpretation post-operatively. COMPARISON:  Left hip radiographs-07/26/2020 FINDINGS: Two spot fluoroscopic images of the left hip are provided for review and demonstrate apparent reduction of previously identified dislocated left total hip prosthesis. IMPRESSION: Post reduction of left total hip prosthesis without apparent complication. Electronically Signed   By: Sandi Mariscal M.D.   On: 07/27/2020 09:29   DG HIP OPERATIVE UNILAT W OR W/O PELVIS LEFT  Result Date: 07/27/2020 CLINICAL DATA:  Post left hip reduction. EXAM: OPERATIVE LEFT HIP (WITH PELVIS IF PERFORMED) 2 VIEWS TECHNIQUE: Fluoroscopic spot image(s) were submitted for interpretation post-operatively. COMPARISON:  Left hip radiographs-07/26/2020 FINDINGS: Two spot fluoroscopic images of the left hip are  provided for review and demonstrate apparent reduction of previously identified dislocated left total hip prosthesis. IMPRESSION: Post reduction of left total hip prosthesis without apparent complication. Electronically Signed   By: Sandi Mariscal M.D.   On: 07/27/2020 09:29   DG Hip Unilat W or Wo Pelvis 2-3 Views Left  Result Date: 07/26/2020 CLINICAL DATA:  59 year old male with a history of fall EXAM: DG HIP (WITH OR WITHOUT PELVIS) 2-3V LEFT COMPARISON:  CT 04/29/2020 FINDINGS: Posterior dislocation of the left hip arthroplasty. No fracture identified in the proximal femur. No fracture identified  at the pelvis. Osteopenia. IMPRESSION: Posterior dislocation of the left hip arthroplasty components Electronically Signed   By: Corrie Mckusick D.O.   On: 07/26/2020 14:03    Marzetta Board, MD, PhD [facility]  Between 7 am - 7 pm I am available, please contact me via Amion or Securechat  Between 7 pm - 7 am I am not available, please contact night coverage MD/APP via Amion

## 2020-07-27 NOTE — Op Note (Signed)
07/26/2020 - 07/27/2020  9:05 AM  PATIENT:  Mark [name]    PRE-OPERATIVE DIAGNOSIS: Prosthetic left hip dislocation  POST-OPERATIVE DIAGNOSIS:  Same  PROCEDURE: Closed reduction, left prosthetic hip dislocation  SURGEON:  Johnny Bridge, MD  PHYSICIAN ASSISTANT: None  ANESTHESIA:   General  PREOPERATIVE INDICATIONS:  [name] is a  59 y.o. male who had a left total hip replacement performed by Dr. 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  He presented after having slipped on water with a left prosthetic hip dislocation  The risks benefits and alternatives were discussed with the patient preoperatively including but not limited to the risks of infection, bleeding, nerve injury, cardiopulmonary complications, the need for revision surgery, among others, and the patient was willing to proceed.  ESTIMATED BLOOD LOSS: None  OPERATIVE IMPLANTS: None  OPERATIVE FINDINGS: Posterior dislocated prosthetic hip  OPERATIVE PROCEDURE: The patient was brought to the operating room and placed in supine position.  General anesthesia was administered.  In the supine position I was able to forward flex his leg, and then reduce the hip dislocation.  X-ray was used taking an AP, as well as a crosstable oblique that confirmed that the femoral acetabular joint was reduced on all planes.  A knee immobilizer was placed.  He had significant odor coming from his perineum, and we abducted his legs, and cleaned his perineum, he had a couple of open sores at the upper aspect of his thighs, the right side was more than the left, and there was a slight amount of subcutaneous purulence noted.  These were dressed with sterile gauze after cleaning his perineum.  He tolerated the procedure well, and was awakened and returned to the PACU in stable and satisfactory condition.  We will plan for physical therapy, and discharge  possibly later today versus tomorrow depending on his mobility.  Follow-up with Dr. Mayer Camel in approximately 1 week.

## 2020-07-27 NOTE — Progress Notes (Signed)
  Echocardiogram 2D Echocardiogram has been performed.  Mark [name] Mark [name] 07/27/2020, 3:22 PM

## 2020-07-27 NOTE — Progress Notes (Signed)
Discussed patient with primary team, noted tachycardia on telemetry unclear of rhythm. Reviewed telemetry. Patient in aflutter, new diagnosis. Not having extreme rates. I have recommended starting lopressor 25mg  bid. Low CHADS2Vasc score but would start eliquis 5mg  bid in case cardioversion is needed in the future. If can be rate controlled would plan for outpatient cardioversion in 3 weeks on anticoag. Would not require long term anticoaguiation, could stop 1 month after cardioversion. Call us back if significant issues with rate control, we will arrange an outpatient cardiology appt. Obtain echo.   Carlyle Dolly MD

## 2020-07-27 NOTE — Progress Notes (Signed)
[place] for apixaban Indication: atrial flutter, possible cardioversion in future  Allergies  Allergen Reactions  . Penicillins Swelling, Rash and Other (See Comments)    Arms and eyes swell & skin breaks out  Has patient had a PCN reaction causing immediate rash, facial/tongue/throat swelling, SOB or lightheadedness with hypotension: Yes Has patient had a PCN reaction causing severe rash involving mucus membranes or skin necrosis: Yes Has patient had a PCN reaction that required hospitalization: No Has patient had a PCN reaction occurring within the last 10 years: No If all of the above answers are "NO", then may proceed with Cephalosporin use.     Patient Measurements: Height: 5\' 9"  (175.3 cm) Weight: 115.7 kg (255 lb) IBW/kg (Calculated) : 70.7  Vital Signs: Temp: 98.3 F (36.8 C) (09/19 1146) Temp Source: Oral (09/19 1146) BP: 187/73 (09/19 1146) Pulse Rate: 92 (09/19 1146)  Labs: Recent Labs    07/26/20 1409 07/26/20 2302 07/27/20 0212  HGB 9.1*  --   --   HCT 28.1*  --   --   PLT 202  --   --   CREATININE 4.93*  --   --   CKTOTAL 958*  --  690*  TROPONINIHS  --  77* 86*    Estimated Creatinine Clearance: 19.7 mL/min (A) (by C-G formula based on SCr of 4.93 mg/dL (H)).   Medical History: Past Medical History:  Diagnosis Date  . Anemia    labs today indicate (09/27/11)  . Arthritis    right knee  . Bleeding ulcer    hx of, required 6 units blood  . Blood transfusion    6 units with ulcer repair surgery  . Chronic kidney disease    stage IV, has fistula, never started diaylsis  . Complication of anesthesia    little slow to wake up with last surgery  . Dyspnea    with exertion  . Gout   . Hypertension   . Osteoporosis   . Pneumonia     Medications:  Scheduled:  . allopurinol  300 mg Oral Daily  . doxycycline  100 mg Oral Q12H  . furosemide  40 mg Oral BID  . metoprolol tartrate  25 mg Oral BID  .  sodium bicarbonate  650 mg Oral BID    Assessment: 59 year old male with CKD stage V admitted for dislocation of left hip and is newly diagnosed with atrial flutter. CHA2DS2-VASc = 1 (HTN). Pharmacy consulted to start apixaban in case patient needs cardioversion.  No prior to admission anticoagulation. Hgb low but improved from prior baseline 2 months ago, no bleeding noted.   Patient went to OR this morning 9/19 for dislocation, confirmed with Dr. Cruzita Lederer to start apixaban this evening.  Goal of Therapy:  Monitor platelets by anticoagulation protocol: Yes   Plan:  Start apixaban 5 mg twice daily Monitor CBC and signs/symptoms bleeding  Fara Olden, PharmD PGY-1 Pharmacy Resident 07/27/2020 12:18 PM Please see AMION for all pharmacy numbers

## 2020-07-27 NOTE — Transfer of Care (Signed)
Immediate Anesthesia Transfer of Care Note  Patient: Mark [name]  Procedure(s) Performed: CLOSED REDUCTION HIP (Left Hip)  Patient Location: PACU  Anesthesia Type:General  Level of Consciousness: awake, alert  and oriented  Airway & Oxygen Therapy: Patient Spontanous Breathing  Post-op Assessment: Report given to RN and Post -op Vital signs reviewed and stable  Post vital signs: Reviewed and stable  Last Vitals:  Vitals Value Taken Time  BP 152/72 07/27/20 0915  Temp 36.4 C 07/27/20 0915  Pulse 89 07/27/20 0926  Resp 23 07/27/20 0926  SpO2 92 % 07/27/20 0926  Vitals shown include unvalidated device data.  Last Pain:  Vitals:   07/27/20 0915  TempSrc:   PainSc: 0-No pain      Patients Stated Pain Goal: 3 (24/17/53 0104)  Complications: No complications documented.

## 2020-07-27 NOTE — Anesthesia Postprocedure Evaluation (Signed)
Anesthesia Post Note  Patient: Mark [name]  Procedure(s) Performed: CLOSED REDUCTION HIP (Left Hip)     Patient location during evaluation: PACU Anesthesia Type: General Level of consciousness: awake and alert Pain management: pain level controlled Vital Signs Assessment: post-procedure vital signs reviewed and stable Respiratory status: spontaneous breathing, nonlabored ventilation, respiratory function stable and patient connected to nasal cannula oxygen Cardiovascular status: blood pressure returned to baseline and stable Postop Assessment: no apparent nausea or vomiting Anesthetic complications: no   No complications documented.  Last Vitals:  Vitals:   07/27/20 1100 07/27/20 1146  BP:  (!) 187/73  Pulse: 92 92  Resp: 18 18  Temp:  36.8 C  SpO2: 98% 99%    Last Pain:  Vitals:   07/27/20 1146  TempSrc: Oral  PainSc:                  Staphanie Harbison COKER

## 2020-07-27 NOTE — Anesthesia Procedure Notes (Signed)
Procedure Name: Intubation Date/Time: 07/27/2020 8:46 AM Performed by: Clearnce Sorrel, CRNA Pre-anesthesia Checklist: Patient identified, Emergency Drugs available, Suction available, Patient being monitored and Timeout performed Patient Re-evaluated:Patient Re-evaluated prior to induction Oxygen Delivery Method: Circle system utilized Preoxygenation: Pre-oxygenation with 100% oxygen Induction Type: IV induction Ventilation: Mask ventilation without difficulty Laryngoscope Size: Mac, 4 and Glidescope (DLx2 without success. Glidescope applied.) Grade View: Grade I Tube type: Oral Tube size: 7.5 mm Number of attempts: 2 Airway Equipment and Method: Stylet Placement Confirmation: ETT inserted through vocal cords under direct vision,  positive ETCO2 and breath sounds checked- equal and bilateral Secured at: 23 cm Tube secured with: Tape Dental Injury: Teeth and Oropharynx as per pre-operative assessment

## 2020-07-27 NOTE — Anesthesia Preprocedure Evaluation (Signed)
Anesthesia Evaluation  Patient identified by MRN, date of birth, ID band Patient awake    Reviewed: Allergy & Precautions, NPO status , Patient's Chart, lab work & pertinent test results  Airway Mallampati: III  TM Distance: >3 FB Neck ROM: Full    Dental  (+) Teeth Intact, Dental Advisory Given   Pulmonary Current Smoker and Patient abstained from smoking.,    breath sounds clear to auscultation       Cardiovascular hypertension,  Rhythm:Regular Rate:Tachycardia     Neuro/Psych    GI/Hepatic   Endo/Other    Renal/GU      Musculoskeletal   Abdominal   Peds  Hematology   Anesthesia Other Findings   Reproductive/Obstetrics                             Anesthesia Physical Anesthesia Plan  ASA: III  Anesthesia Plan: General   Post-op Pain Management:    Induction: Intravenous  PONV Risk Score and Plan: Ondansetron and Dexamethasone  Airway Management Planned: Mask  Additional Equipment:   Intra-op Plan:   Post-operative Plan:   Informed Consent: I have reviewed the patients History and Physical, chart, labs and discussed the procedure including the risks, benefits and alternatives for the proposed anesthesia with the patient or authorized representative who has indicated his/her understanding and acceptance.     Dental advisory given  Plan Discussed with: CRNA and Anesthesiologist  Anesthesia Plan Comments:         Anesthesia Quick Evaluation

## 2020-07-28 ENCOUNTER — Encounter (HOSPITAL_COMMUNITY): Payer: Self-pay | Admitting: Orthopedic Surgery

## 2020-07-28 DIAGNOSIS — E669 Obesity, unspecified: Secondary | ICD-10-CM | POA: Diagnosis present

## 2020-07-28 DIAGNOSIS — T84021A Dislocation of internal left hip prosthesis, initial encounter: Secondary | ICD-10-CM | POA: Diagnosis present

## 2020-07-28 DIAGNOSIS — M1711 Unilateral primary osteoarthritis, right knee: Secondary | ICD-10-CM | POA: Diagnosis present

## 2020-07-28 DIAGNOSIS — A419 Sepsis, unspecified organism: Secondary | ICD-10-CM | POA: Diagnosis present

## 2020-07-28 DIAGNOSIS — W010XXA Fall on same level from slipping, tripping and stumbling without subsequent striking against object, initial encounter: Secondary | ICD-10-CM | POA: Diagnosis present

## 2020-07-28 DIAGNOSIS — R6520 Severe sepsis without septic shock: Secondary | ICD-10-CM | POA: Diagnosis present

## 2020-07-28 DIAGNOSIS — Z880 Allergy status to penicillin: Secondary | ICD-10-CM | POA: Diagnosis not present

## 2020-07-28 DIAGNOSIS — E872 Acidosis: Secondary | ICD-10-CM | POA: Diagnosis present

## 2020-07-28 DIAGNOSIS — Z20822 Contact with and (suspected) exposure to covid-19: Secondary | ICD-10-CM | POA: Diagnosis present

## 2020-07-28 DIAGNOSIS — I4892 Unspecified atrial flutter: Secondary | ICD-10-CM | POA: Diagnosis present

## 2020-07-28 DIAGNOSIS — N2581 Secondary hyperparathyroidism of renal origin: Secondary | ICD-10-CM | POA: Diagnosis present

## 2020-07-28 DIAGNOSIS — N170 Acute kidney failure with tubular necrosis: Secondary | ICD-10-CM | POA: Diagnosis present

## 2020-07-28 DIAGNOSIS — N179 Acute kidney failure, unspecified: Secondary | ICD-10-CM

## 2020-07-28 DIAGNOSIS — Y792 Prosthetic and other implants, materials and accessory orthopedic devices associated with adverse incidents: Secondary | ICD-10-CM | POA: Diagnosis present

## 2020-07-28 DIAGNOSIS — E1122 Type 2 diabetes mellitus with diabetic chronic kidney disease: Secondary | ICD-10-CM | POA: Diagnosis present

## 2020-07-28 DIAGNOSIS — I10 Essential (primary) hypertension: Secondary | ICD-10-CM | POA: Diagnosis not present

## 2020-07-28 DIAGNOSIS — N184 Chronic kidney disease, stage 4 (severe): Secondary | ICD-10-CM | POA: Diagnosis not present

## 2020-07-28 DIAGNOSIS — F17210 Nicotine dependence, cigarettes, uncomplicated: Secondary | ICD-10-CM | POA: Diagnosis present

## 2020-07-28 DIAGNOSIS — Y92010 Kitchen of single-family (private) house as the place of occurrence of the external cause: Secondary | ICD-10-CM | POA: Diagnosis not present

## 2020-07-28 DIAGNOSIS — L97129 Non-pressure chronic ulcer of left thigh with unspecified severity: Secondary | ICD-10-CM | POA: Diagnosis present

## 2020-07-28 DIAGNOSIS — M109 Gout, unspecified: Secondary | ICD-10-CM | POA: Diagnosis present

## 2020-07-28 DIAGNOSIS — S73005D Unspecified dislocation of left hip, subsequent encounter: Secondary | ICD-10-CM | POA: Diagnosis not present

## 2020-07-28 DIAGNOSIS — J189 Pneumonia, unspecified organism: Secondary | ICD-10-CM | POA: Diagnosis present

## 2020-07-28 DIAGNOSIS — D631 Anemia in chronic kidney disease: Secondary | ICD-10-CM | POA: Diagnosis present

## 2020-07-28 DIAGNOSIS — I120 Hypertensive chronic kidney disease with stage 5 chronic kidney disease or end stage renal disease: Secondary | ICD-10-CM | POA: Diagnosis present

## 2020-07-28 DIAGNOSIS — L97119 Non-pressure chronic ulcer of right thigh with unspecified severity: Secondary | ICD-10-CM | POA: Diagnosis present

## 2020-07-28 DIAGNOSIS — M6282 Rhabdomyolysis: Secondary | ICD-10-CM | POA: Diagnosis present

## 2020-07-28 DIAGNOSIS — R300 Dysuria: Secondary | ICD-10-CM | POA: Diagnosis present

## 2020-07-28 DIAGNOSIS — Z6837 Body mass index (BMI) 37.0-37.9, adult: Secondary | ICD-10-CM | POA: Diagnosis not present

## 2020-07-28 DIAGNOSIS — Z7982 Long term (current) use of aspirin: Secondary | ICD-10-CM | POA: Diagnosis not present

## 2020-07-28 DIAGNOSIS — N185 Chronic kidney disease, stage 5: Secondary | ICD-10-CM | POA: Diagnosis present

## 2020-07-28 LAB — CBC
HCT: 26.6 % — ABNORMAL LOW (ref 39.0–52.0)
Hemoglobin: 8.3 g/dL — ABNORMAL LOW (ref 13.0–17.0)
MCH: 32.5 pg (ref 26.0–34.0)
MCHC: 31.2 g/dL (ref 30.0–36.0)
MCV: 104.3 fL — ABNORMAL HIGH (ref 80.0–100.0)
Platelets: 164 10*3/uL (ref 150–400)
RBC: 2.55 MIL/uL — ABNORMAL LOW (ref 4.22–5.81)
RDW: 14 % (ref 11.5–15.5)
WBC: 12 10*3/uL — ABNORMAL HIGH (ref 4.0–10.5)
nRBC: 0.2 % (ref 0.0–0.2)

## 2020-07-28 LAB — COMPREHENSIVE METABOLIC PANEL
ALT: 17 U/L (ref 0–44)
AST: 37 U/L (ref 15–41)
Albumin: 2.9 g/dL — ABNORMAL LOW (ref 3.5–5.0)
Alkaline Phosphatase: 63 U/L (ref 38–126)
Anion gap: 15 (ref 5–15)
BUN: 129 mg/dL — ABNORMAL HIGH (ref 8–23)
CO2: 15 mmol/L — ABNORMAL LOW (ref 22–32)
Calcium: 11.7 mg/dL — ABNORMAL HIGH (ref 8.9–10.3)
Chloride: 107 mmol/L (ref 98–111)
Creatinine, Ser: 5.3 mg/dL — ABNORMAL HIGH (ref 0.61–1.24)
GFR calc Af Amer: 12 mL/min — ABNORMAL LOW (ref >60)
GFR calc non Af Amer: 11 mL/min — ABNORMAL LOW (ref >60)
Glucose, Bld: 136 mg/dL — ABNORMAL HIGH (ref 70–99)
Potassium: 4.5 mmol/L (ref 3.5–5.1)
Sodium: 137 mmol/L (ref 135–145)
Total Bilirubin: 0.6 mg/dL (ref 0.3–1.2)
Total Protein: 7.7 g/dL (ref 6.5–8.1)

## 2020-07-28 LAB — URINE CULTURE: Culture: 30000 — AB

## 2020-07-28 MED ORDER — METOPROLOL TARTRATE 50 MG PO TABS
50.0000 mg | ORAL_TABLET | Freq: Two times a day (BID) | ORAL | Status: DC
  Administered 2020-07-28: 50 mg via ORAL
  Filled 2020-07-28: qty 1

## 2020-07-28 MED ORDER — METOPROLOL TARTRATE 50 MG PO TABS: 50.0000 mg | ORAL_TABLET | Freq: Two times a day (BID) | ORAL | Status: DC

## 2020-07-28 NOTE — Care Management (Signed)
Case manager submitted Benefits check request to [place] for Eliquis co-pay, will continue to monitor. Ricki Miller, RN BSN Case Manager

## 2020-07-28 NOTE — TOC Benefit Eligibility Note (Signed)
Transition of Care [facility]) Benefit Eligibility Note    Patient Details  Name: Mark [name] MRN: [id number] Date of Birth: [birth year]   Medication/Dose: Eliquis 2.5 and 5mg . bid for 30 day supply  Covered?: Yes  Tier: 2 Drug  Prescription Coverage [facility]: [facility]  Spoke with Person/Company/Phone Number:: Londell Moh [employer] Help Desk PH# [phone]  Co-Pay: $4.99  Prior Approval: No  Deductible:  (No Deductible)       Shelda Altes Phone Number: 07/28/2020, 11:14 AM

## 2020-07-28 NOTE — NC FL2 (Signed)
[place] LEVEL OF CARE SCREENING TOOL     IDENTIFICATION  Patient Name: Mark [name] Birthdate: May 06, 1961 Sex: male Admission Date (Current Location): 07/26/2020  [facility] and [place] Number:  Herbalist and Address:  [facility], [place] [address], [place], [place] [address]      Provider Number: [id number]  Attending Physician Name and Address:  Caren Griffins, MD  Relative Name and Phone Number:  [name] [phone]    Current Level of Care: Hospital Recommended Level of Care: [place] Prior Approval Number:    Date Approved/Denied:   PASRR Number: [id number]  Discharge Plan: SNF    Current Diagnoses: Patient Active Problem List   Diagnosis Date Noted  . Atrial flutter with rapid ventricular response ([place]) 07/28/2020  . Sepsis ([place]) 07/26/2020  . Closed dislocation of left hip ([place]) 07/26/2020  . CKD (chronic kidney disease), stage IV ([place]) 07/26/2020  . Gout 07/26/2020  . Pneumonia 07/26/2020  . Diverticula of colon 04/29/2020  . Acute renal failure superimposed on chronic kidney disease ([place]) 04/29/2020  . Diverticulitis   . Sinus tachycardia 10/21/2015  . DM (diabetes mellitus) ([place]) 10/09/2011  . Benign essential HTN 10/09/2011  . Post-traumatic osteoarthritis of left hip 10/06/2011    Orientation RESPIRATION BLADDER Height & Weight     Self, Time, Situation, Place  Normal Continent Weight: 249 lb 3.2 oz (113 kg) Height:  5\' 9"  (175.3 cm)  BEHAVIORAL SYMPTOMS/MOOD NEUROLOGICAL BOWEL NUTRITION STATUS      Continent Diet (See Discharge Summary)  AMBULATORY STATUS COMMUNICATION OF NEEDS Skin   Limited Assist Verbally Skin abrasions, Surgical wounds, Other (Comment) (Abrasion Groin R,Incision Closed Groin Foam-Lift Dressing Clean,dry,intact,Wound/incision non-pressure wound thigh anterior;R,foamlift dressingclean;dry;intact)                       Personal Care Assistance Level of  Assistance  Bathing, Feeding, Dressing Bathing Assistance: Independent Feeding assistance: Independent Dressing Assistance: Independent     Functional Limitations Info  Sight, Hearing, Speech Sight Info: Adequate Hearing Info: Adequate Speech Info: Adequate    SPECIAL CARE FACTORS FREQUENCY  PT (By licensed PT), OT (By licensed OT)     PT Frequency: 5x min weekly OT Frequency: 5x min weekly            Contractures Contractures Info: Not present    Additional Factors Info  Code Status, Allergies Code Status Info: FULL Allergies Info: Penicillins           Current Medications (07/28/2020):  This is the current hospital active medication list Current Facility-Administered Medications  Medication Dose Route Frequency Provider Last Rate Last Admin  . acetaminophen (TYLENOL) tablet 650 mg  650 mg Oral Q6H PRN Samuella Cota, MD       Or  . acetaminophen (TYLENOL) suppository 650 mg  650 mg Rectal Q6H PRN Samuella Cota, MD      . allopurinol (ZYLOPRIM) tablet 300 mg  300 mg Oral Daily Samuella Cota, MD   300 mg at 07/28/20 0818  . apixaban (ELIQUIS) tablet 5 mg  5 mg Oral BID Caren Griffins, MD   5 mg at 07/28/20 0819  . cefTRIAXone (ROCEPHIN) 1 g in sodium chloride 0.9 % 100 mL IVPB  1 g Intravenous Q24H Samuella Cota, MD 200 mL/hr at 07/28/20 0824 1 g at 07/28/20 0824  . doxycycline (VIBRA-TABS) tablet 100 mg  100 mg Oral Q12H Samuella Cota, MD  100 mg at 07/28/20 0819  . furosemide (LASIX) tablet 40 mg  40 mg Oral BID Samuella Cota, MD   40 mg at 07/28/20 8727  . HYDROcodone-acetaminophen (NORCO/VICODIN) 5-325 MG per tablet 1-2 tablet  1-2 tablet Oral Q6H PRN Samuella Cota, MD      . metoprolol tartrate (LOPRESSOR) injection 5 mg  5 mg Intravenous Q4H PRN Shela Leff, MD   5 mg at 07/27/20 0648  . metoprolol tartrate (LOPRESSOR) tablet 50 mg  50 mg Oral BID Marzetta Board M, MD      . sodium bicarbonate tablet 650 mg  650 mg  Oral BID Samuella Cota, MD   650 mg at 07/28/20 6184     Discharge Medications: Please see discharge summary for a list of discharge medications.  Relevant Imaging Results:  Relevant Lab Results:   Additional Information [phone]  Trula Ore, LCSWA

## 2020-07-28 NOTE — Consult Note (Signed)
Reason for Consult: AKI/CKD stage IV Referring Physician:  Cruzita Lederer, MD  Mark [name] is an 59 y.o. male with a PMH significant for HTN, DM, HLD, morbid obestiy, gout, CKD stage IV followed by Dr. Posey Pronto (last Scr 3.54 on 07/07/20) and prior history of AKI on CKD (due to NSAIDS, and again in the setting of ACEi and metformin).  He was seen in our office on 07/15/20 and was noted to be hypervolemic and his lasix dose was increased to 40 mg bid for 4 days.  He presented to [facility] ED on 07/26/20 after a fall at home with inability to walk.  In the ED he was found to have a dislocated left hip as well as possible sepsis from community acquired pneumonia.  He was admitted for IV antibiotics and ortho evaluation.  We were consulted to due the development of AKI/CKD stage IV.  The trend in Scr is seen below.  He was also found to have new onset of atrial flutter with HR's in the 140's and drops in his BPs.  He denies any N/V/D, SOB, orthopnea, PND.  No anorexia, malaise, dysgeusia, or myoclonic jerking.  His main complaint is left hip pain and inability to walk.  He has had lower extremity edema for several weeks.   Trend in Creatinine: Creatinine, Ser  Date/Time Value Ref Range Status  07/28/2020 03:07 AM 5.30 (H) 0.61 - 1.24 mg/dL Final  07/26/2020 02:09 PM 4.93 (H) 0.61 - 1.24 mg/dL Final  05/03/2020 09:18 AM 4.34 (H) 0.61 - 1.24 mg/dL Final  05/02/2020 05:38 AM 4.39 (H) 0.61 - 1.24 mg/dL Final  05/01/2020 04:53 AM 4.48 (H) 0.61 - 1.24 mg/dL Final  04/30/2020 04:31 AM 4.73 (H) 0.61 - 1.24 mg/dL Final  04/29/2020 08:26 AM 5.24 (H) 0.61 - 1.24 mg/dL Final  05/18/2017 02:33 PM 2.32 (H) 0.61 - 1.24 mg/dL Final  10/07/2011 07:00 AM 1.20 0.50 - 1.35 mg/dL Final  09/27/2011 12:04 PM 1.20 0.50 - 1.35 mg/dL Final  12/02/2010 06:26 AM 1.21 0.40 - 1.50 mg/dL Final  12/01/2010 04:50 AM 1.51 (H) 0.40 - 1.50 mg/dL Final  11/30/2010 11:01 AM 2.26 (H) 0.40 - 1.50 mg/dL Final    PMH:   Past Medical History:   Diagnosis Date  . Anemia    labs today indicate (09/27/11)  . Arthritis    right knee  . Bleeding ulcer    hx of, required 6 units blood  . Blood transfusion    6 units with ulcer repair surgery  . Chronic kidney disease    stage IV, has fistula, never started diaylsis  . Complication of anesthesia    little slow to wake up with last surgery  . Dyspnea    with exertion  . Gout   . Hypertension   . Osteoporosis   . Pneumonia     PSH:   Past Surgical History:  Procedure Laterality Date  . AV FISTULA PLACEMENT Left 09/07/2016   Procedure: ARTERIOVENOUS (AV) FISTULA CREATION LEFT ARM;  Surgeon: Angelia Mould, MD;  Location: [place];  Service: Vascular;  Laterality: Left;  . COLONOSCOPY    . EXTERNAL EAR SURGERY    . HIP CLOSED REDUCTION Left 07/27/2020   Procedure: CLOSED REDUCTION HIP;  Surgeon: Marchia Bond, MD;  Location: [place];  Service: Orthopedics;  Laterality: Left;  . HIP PINNING  11/2010  . JOINT REPLACEMENT    . OTHER SURGICAL HISTORY  ~ 08/1998   repair bleeding ulcer  . SHOULDER ARTHROSCOPY WITH ROTATOR  CUFF REPAIR AND SUBACROMIAL DECOMPRESSION Right 05/26/2017   Procedure: SHOULDER ARTHROSCOPY WITH ROTATOR CUFF REPAIR AND SUBACROMIAL DECOMPRESSION;  Surgeon: Tania Ade, MD;  Location: [place];  Service: Orthopedics;  Laterality: Right;  SHOULDER ARTHROSCOPY WITH ROTATOR CUFF REPAIR AND SUBACROMIAL DECOMPRESSION  . TOTAL HIP ARTHROPLASTY  10/06/11   left  . TOTAL HIP ARTHROPLASTY  10/06/2011   Procedure: TOTAL HIP ARTHROPLASTY;  Surgeon: Kerin Salen;  Location: [place];  Service: Orthopedics;  Laterality: Left;    Allergies:  Allergies  Allergen Reactions  . Penicillins Swelling, Rash and Other (See Comments)    Arms and eyes swell & skin breaks out  Has patient had a PCN reaction causing immediate rash, facial/tongue/throat swelling, SOB or lightheadedness with hypotension: Yes Has patient had a PCN reaction causing severe rash involving mucus  membranes or skin necrosis: Yes Has patient had a PCN reaction that required hospitalization: No Has patient had a PCN reaction occurring within the last 10 years: No If all of the above answers are "NO", then may proceed with Cephalosporin use.     Medications:   Prior to Admission medications   Medication Sig Start Date End Date Taking? Authorizing Provider  acetaminophen (TYLENOL) 325 MG tablet Take 325 mg by mouth every 6 (six) hours as needed for mild pain or headache.    Yes [provider]  allopurinol (ZYLOPRIM) 300 MG tablet Take 300 mg by mouth daily after breakfast.    Yes [provider]  carvedilol (COREG) 25 MG tablet Take 25 mg by mouth daily after breakfast.    Yes [provider]  Cholecalciferol (VITAMIN D3 PO) Take 1 tablet by mouth daily after breakfast.   Yes [provider]  ferrous sulfate 325 (65 FE) MG tablet Take 1 tablet (325 mg total) by mouth daily with breakfast. Patient taking differently: Take 325 mg by mouth daily after breakfast.  05/03/20 08/01/20 Yes Irene Pap N, DO  furosemide (LASIX) 40 MG tablet Take 40 mg by mouth 2 (two) times daily.   Yes [provider]  Multiple Vitamin (MULTIVITAMIN WITH MINERALS) TABS tablet Take 1 tablet by mouth daily after breakfast.   Yes [provider]  sodium bicarbonate 650 MG tablet Take 650 mg by mouth 3 (three) times daily.  05/16/15  Yes [provider]    Inpatient medications: . allopurinol  300 mg Oral Daily  . apixaban  5 mg Oral BID  . doxycycline  100 mg Oral Q12H  . furosemide  40 mg Oral BID  . metoprolol tartrate  50 mg Oral BID  . sodium bicarbonate  650 mg Oral BID    Discontinued Meds:   Medications Discontinued During This Encounter  Medication Reason  . sodium chloride 0.9 % bolus 500 mL   . SENSIPAR 30 MG tablet Cost of medication  . Multiple Vitamins-Minerals (ONE-A-DAY MENS 50+) TABS Change in therapy  . aspirin EC 81 MG tablet  Change in therapy  . loratadine (CLARITIN) 10 MG tablet Patient Preference  . tetrahydrozoline 0.05 % ophthalmic solution Patient Preference  . 0.9 %  sodium chloride infusion Patient Transfer  . fentaNYL (SUBLIMAZE) injection 25-50 mcg Patient Transfer  . ondansetron (ZOFRAN) injection 4 mg Patient Transfer  . oxyCODONE (Oxy IR/ROXICODONE) immediate release tablet 5 mg Patient Transfer  . oxyCODONE (ROXICODONE) 5 MG/5ML solution 5 mg Patient Transfer  . carvedilol (COREG) tablet 25 mg   . 0.9 %  sodium chloride infusion   . metoprolol tartrate (LOPRESSOR) tablet  25 mg   . metoprolol tartrate (LOPRESSOR) tablet 50 mg   . oxyCODONE (Oxy IR/ROXICODONE) immediate release tablet 5 mg P&T Policy: Duplicate PRN Therapy    Social History:  reports that he has been smoking cigarettes. He has a 8.00 pack-year smoking history. He quit smokeless tobacco use about 46 years ago.  His smokeless tobacco use included chew. He reports current alcohol use of about 5.0 standard drinks of alcohol per week. He reports that he does not use drugs.  Family History:   Family History  Problem Relation Age of Onset  . Cancer Mother   . Atrial fibrillation Mother   . Hypertension Mother   . Cancer Father     Pertinent items are noted in HPI. Weight change: -2.631 kg  Intake/Output Summary (Last 24 hours) at 07/28/2020 1810 Last data filed at 07/28/2020 0600 Gross per 24 hour  Intake 820 ml  Output 750 ml  Net 70 ml   BP 115/76 (BP Location: Right Arm)   Pulse (!) 113   Temp 98.6 F (37 C) (Oral)   Resp 15   Ht 5\' 9"  (1.753 m)   Wt 113 kg   SpO2 (!) 89%   BMI 36.80 kg/m  Vitals:   07/28/20 1300 07/28/20 1330 07/28/20 1500 07/28/20 1622  BP: 100/68 100/68 117/90 115/76  Pulse:   (!) 116 (!) 113  Resp:    15  Temp: 97.8 F (36.6 C)   98.6 F (37 C)  TempSrc: Oral   Oral  SpO2:   (!) 89%   Weight:      Height:         General appearance: alert, cooperative, no distress and morbidly  obese Head: Normocephalic, without obvious abnormality, atraumatic Resp: diminished breath sounds bibasilar Cardio: tachycardic, no rub GI: soft, non-tender; bowel sounds normal; no masses,  no organomegaly Extremities: trace pretibial edema bilaterally, foul odor coming from groin area, left AVF +T/B  Labs: Basic Metabolic Panel: Recent Labs  Lab 07/26/20 1409 07/28/20 0307  NA 139 137  K 4.5 4.5  CL 105 107  CO2 16* 15*  GLUCOSE 109* 136*  BUN 112* 129*  CREATININE 4.93* 5.30*  ALBUMIN 3.2* 2.9*  CALCIUM 12.3* 11.7*   Liver Function Tests: Recent Labs  Lab 07/26/20 1409 07/28/20 0307  AST 45* 37  ALT 15 17  ALKPHOS 73 63  BILITOT 1.0 0.6  PROT 8.7* 7.7  ALBUMIN 3.2* 2.9*   No results for input(s): LIPASE, AMYLASE in the last 168 hours. No results for input(s): AMMONIA in the last 168 hours. CBC: Recent Labs  Lab 07/26/20 1409 07/28/20 0307  WBC 12.0* 12.0*  NEUTROABS 9.8*  --   HGB 9.1* 8.3*  HCT 28.1* 26.6*  MCV 102.6* 104.3*  PLT 202 164   PT/INR: @LABRCNTIP (inr:5) Cardiac Enzymes: ) Recent Labs  Lab 07/26/20 1409 07/27/20 0212  CKTOTAL 958* 690*   CBG: No results for input(s): GLUCAP in the last 168 hours.  Iron Studies: No results for input(s): IRON, TIBC, TRANSFERRIN, FERRITIN in the last 168 hours.  Xrays/Other Studies: DG Chest 2 View  Result Date: 07/27/2020 CLINICAL DATA:  Short of breath. EXAM: CHEST - 2 VIEW COMPARISON:  07/26/2020 FINDINGS: Stable cardiac enlargement. Persistent nodular opacity within the lateral right lung base, along the right border, is noted which may reflect atelectasis or airspace disease. Mild platelike atelectasis in the left midlung is new from previous exam. No findings of pleural effusion or interstitial edema. IMPRESSION: Persistent nodular opacity  within the lateral right lung base which may reflect atelectasis or airspace disease. Continued follow-up imaging is advised to ensure resolution. If this does  not improve thin more definitive characterization with CT of the chest would be recommended to assess for underlying lung nodule. Electronically Signed   By: Kerby Moors M.D.   On: 07/27/2020 08:57   DG C-Arm 1-60 Min  Result Date: 07/27/2020 CLINICAL DATA:  Post left hip reduction. EXAM: OPERATIVE LEFT HIP (WITH PELVIS IF PERFORMED) 2 VIEWS TECHNIQUE: Fluoroscopic spot image(s) were submitted for interpretation post-operatively. COMPARISON:  Left hip radiographs-07/26/2020 FINDINGS: Two spot fluoroscopic images of the left hip are provided for review and demonstrate apparent reduction of previously identified dislocated left total hip prosthesis. IMPRESSION: Post reduction of left total hip prosthesis without apparent complication. Electronically Signed   By: Sandi Mariscal M.D.   On: 07/27/2020 09:29   ECHOCARDIOGRAM COMPLETE  Result Date: 07/27/2020    ECHOCARDIOGRAM REPORT   Patient Name:   Mark [name] Date of Exam: 07/27/2020 Medical Rec #:  [id number]        Height:       69.0 in Accession #:    [id number]       Weight:       255.0 lb Date of Birth:  [birth year]        BSA:          2.291 m Patient Age:    24 years         BP:           157/81 mmHg Patient Gender: M                HR:           114 bpm. Exam Location:  Inpatient Procedure: 2D Echo, Cardiac Doppler, Color Doppler and Intracardiac            Opacification Agent Indications:    I50.33 Acute on chronic diastolic (congestive) heart failure  History:        Patient has no prior history of Echocardiogram examinations.                 Signs/Symptoms:Dyspnea; Risk Factors:Hypertension. CKD.  Sonographer:    Jonelle Sidle Dance Referring Phys: [id number] Caren Griffins  Sonographer Comments: Suboptimal parasternal window and patient is morbidly obese. IMPRESSIONS  1. Left ventricular ejection fraction, by estimation, is 60 to 65%. The left ventricle has normal function. Left ventricular endocardial border not optimally defined to evaluate regional wall  motion. Left ventricular diastolic parameters are indeterminate.  2. Right ventricular systolic function was not well visualized. The right ventricular size is not well visualized.  3. Left atrial size was moderately dilated.  4. The mitral valve was not well visualized. No evidence of mitral valve regurgitation. No evidence of mitral stenosis.  5. The aortic valve was not well visualized. Aortic valve regurgitation is not visualized. No aortic stenosis is present.  6. The inferior vena cava is normal in size with greater than 50% respiratory variability, suggesting right atrial pressure of 3 mmHg. FINDINGS  Left Ventricle: Left ventricular ejection fraction, by estimation, is 60 to 65%. The left ventricle has normal function. Left ventricular endocardial border not optimally defined to evaluate regional wall motion. The left ventricular internal cavity size was normal in size. There is no left ventricular hypertrophy. Left ventricular diastolic parameters are indeterminate. Right Ventricle: Grossly RV appears normal in size and function. The right ventricular size is not well visualized. Right  vetricular wall thickness was not well visualized. Right ventricular systolic function was not well visualized. Left Atrium: Left atrial size was moderately dilated. Right Atrium: Right atrial size was not well visualized. Pericardium: There is no evidence of pericardial effusion. Mitral Valve: The mitral valve was not well visualized. No evidence of mitral valve regurgitation. No evidence of mitral valve stenosis. Tricuspid Valve: The tricuspid valve is not well visualized. Tricuspid valve regurgitation is not demonstrated. No evidence of tricuspid stenosis. Aortic Valve: The aortic valve was not well visualized. Aortic valve regurgitation is not visualized. No aortic stenosis is present. Aortic valve mean gradient measures 4.2 mmHg. Aortic valve peak gradient measures 8.1 mmHg. Aortic valve area, by VTI measures 2.58 cm.  Pulmonic Valve: The pulmonic valve was not well visualized. Pulmonic valve regurgitation is not visualized. No evidence of pulmonic stenosis. Aorta: The aortic root was not well visualized. Pulmonary Artery: Indeterminant PASP, inadequate TR jet. Venous: The inferior vena cava is normal in size with greater than 50% respiratory variability, suggesting right atrial pressure of 3 mmHg. IAS/Shunts: No atrial level shunt detected by color flow Doppler.  LEFT VENTRICLE PLAX 2D LVIDd:         4.20 cm LVIDs:         3.60 cm LV PW:         1.10 cm LV IVS:        0.90 cm LVOT diam:     2.00 cm LV SV:         58 LV SV Index:   25 LVOT Area:     3.14 cm  RIGHT VENTRICLE          IVC RV Basal diam:  2.40 cm  IVC diam: 2.20 cm TAPSE (M-mode): 2.1 cm LEFT ATRIUM              Index       RIGHT ATRIUM           Index LA diam:        4.60 cm  2.01 cm/m  RA Area:     24.60 cm LA Vol (A2C):   109.0 ml 47.58 ml/m RA Volume:   68.40 ml  29.86 ml/m LA Vol (A4C):   82.4 ml  35.97 ml/m LA Biplane Vol: 100.0 ml 43.65 ml/m  AORTIC VALVE AV Area (Vmax):    2.47 cm AV Area (Vmean):   2.26 cm AV Area (VTI):     2.58 cm AV Vmax:           142.37 cm/s AV Vmean:          98.965 cm/s AV VTI:            0.223 m AV Peak Grad:      8.1 mmHg AV Mean Grad:      4.2 mmHg LVOT Vmax:         112.09 cm/s LVOT Vmean:        71.096 cm/s LVOT VTI:          0.183 m LVOT/AV VTI ratio: 0.82  AORTA Ao Asc diam: 3.30 cm MITRAL VALVE MV Area (PHT): 4.07 cm     SHUNTS MV Decel Time: 187 msec     Systemic VTI:  0.18 m MV E velocity: 106.50 cm/s  Systemic Diam: 2.00 cm Carlyle Dolly MD Electronically signed by Carlyle Dolly MD Signature Date/Time: 07/27/2020/4:03:17 PM    Final    DG HIP OPERATIVE UNILAT W OR W/O PELVIS LEFT  Result Date: 07/27/2020  CLINICAL DATA:  Post left hip reduction. EXAM: OPERATIVE LEFT HIP (WITH PELVIS IF PERFORMED) 2 VIEWS TECHNIQUE: Fluoroscopic spot image(s) were submitted for interpretation post-operatively. COMPARISON:   Left hip radiographs-07/26/2020 FINDINGS: Two spot fluoroscopic images of the left hip are provided for review and demonstrate apparent reduction of previously identified dislocated left total hip prosthesis. IMPRESSION: Post reduction of left total hip prosthesis without apparent complication. Electronically Signed   By: Sandi Mariscal M.D.   On: 07/27/2020 09:29     Assessment/Plan: 1.  AKI/CKD stage IV- presumably due to ischemic ATN in setting of sepsis from CAP as well as atrial flutter with RVR and hypotension.  He is currently without uremic symptoms.  Will continue to follow closely.  He does have a mature L AVF in place if dialysis needs to be initiated during this hospitalization. 2. Sepsis due to CAP- on abx per primary 3. New onset atrial flutter- cardiology to evaluate and possible anticoagulation and cardioversion.   4. Left hip dislocation- s/p closed reduction of left hip 07/27/20. 5. Anemia of CKD stage IV- will check iron stores and will likely need to start esa 6. Metabolic acidosis- was on sodium bicarb at home.  Resume and follow.  7. SHPTH- will check phos and iPTH. 8. Perineum ulcerations- will need wound care. 9. Disposition- will need SNF placement as he lives alone and are awaiting beds.    Governor Rooks Cionna Collantes 07/28/2020, 6:10 PM

## 2020-07-28 NOTE — TOC Initial Note (Addendum)
Transition of Care [facility]) - Initial/Assessment Note    Patient Details  Name: Mark [name] MRN: [id number] Date of Birth: 1960-12-29  Transition of Care [facility]) CM/SW Contact:    Trula Ore, [place] Phone Number: 07/28/2020, 2:42 PM  Clinical Narrative:                  CSW received consult for possible SNF placement at time of discharge. CSW spoke with patient at bedside regarding PT recommendation of SNF placement at time of discharge.  Patient expressed understanding of PT recommendation and is agreeable to SNF placement at time of discharge. Patient reports preference for [facility] . Patient gave CSW permission to fax out initial referral near [place] area.Patient has received the COVID vaccines. Patient gave CSW permission to discuss his care with his brother [name].No further questions reported at this time. CSW to continue to follow and assist with discharge planning needs. SNF facility will get insurance auth.  Expected Discharge Plan: Skilled Nursing Facility Barriers to Discharge: Continued Medical Work up   Patient Goals and CMS Choice Patient states their goals for this hospitalization and ongoing recovery are:: to go to SNF CMS Medicare.gov Compare Post Acute Care list provided to:: Patient Choice offered to / list presented to : Patient  Expected Discharge Plan and Services Expected Discharge Plan: [place] arrangements for the past 2 months: Single Family Home                                      Prior Living Arrangements/Services Living arrangements for the past 2 months: Single Family Home Lives with:: Self Patient language and need for interpreter reviewed:: Yes Do you feel safe going back to the place where you live?: No   SNF  Need for Family Participation in Patient Care: Yes (Comment) Care giver support system in place?: Yes (comment)   Criminal Activity/Legal Involvement Pertinent to Current  Situation/Hospitalization: No - Comment as needed  Activities of Daily Living      Permission Sought/Granted Permission sought to share information with : Case Manager, Family Supports, Chartered certified accountant granted to share information with : Yes, Verbal Permission Granted  Share Information with NAME: [name]  Permission granted to share info w AGENCY: SNF  Permission granted to share info w Relationship: Brother  Permission granted to share info w Contact Information: [name] [phone]  Emotional Assessment Appearance:: Appears stated age Attitude/Demeanor/Rapport: Gracious Affect (typically observed): Calm Orientation: : Oriented to Self, Oriented to Place, Oriented to  Time, Oriented to Situation Alcohol / Substance Use: Not Applicable Psych Involvement: No (comment)  Admission diagnosis:  Dislocation, hip closed, left, initial encounter ([place]) [S73.005A] Sepsis ([place]) [A41.9] Non-traumatic rhabdomyolysis [M62.82] Sepsis, due to unspecified organism, unspecified whether acute organ dysfunction present ([place]) [A41.9] Atrial flutter with rapid ventricular response ([place]) [I48.92] Patient Active Problem List   Diagnosis Date Noted  . Atrial flutter with rapid ventricular response ([place]) 07/28/2020  . Sepsis ([place]) 07/26/2020  . Closed dislocation of left hip ([place]) 07/26/2020  . CKD (chronic kidney disease), stage IV ([place]) 07/26/2020  . Gout 07/26/2020  . Pneumonia 07/26/2020  . Diverticula of colon 04/29/2020  . Acute renal failure superimposed on chronic kidney disease ([place]) 04/29/2020  . Diverticulitis   . Sinus tachycardia 10/21/2015  . DM (diabetes mellitus) ([place]) 10/09/2011  . Benign essential HTN 10/09/2011  . Post-traumatic  osteoarthritis of left hip 10/06/2011   PCP:  Delrae Rend, MD Pharmacy:   [id number] - [place], [place] [address] [place] [address] Phone: [phone]  Fax: [phone]     Social Determinants of Health (SDOH) Interventions    Readmission Risk Interventions No flowsheet data found.

## 2020-07-28 NOTE — Progress Notes (Signed)
Physical Therapy Treatment Patient Details Name: Mark [name] MRN: [id number] DOB: August 21, 1961 Today's Date: 07/28/2020    History of Present Illness Patient is a 59 y/o male who presents with left hip dislocation s/p fall. Admitted with sepsus and PNA now s/p left hip closed reduction 9/19. New onser A-flutter. PMH includes CKD, HTN, osteoporosis, tobacco use and arthritis.    PT Comments    Patient progressing well towards PT goals. Improved ambulation distance with use of RW and chair follow for support as pt becomes dyspneic and fatigued with exertion. Better able to advance LLE today during gait. Continues to require assist to stand especially from low surfaces. Reviewed hip precautions and positioning with pt. Encouraged walking with nursing daily and avoiding low toilet. Pt reports he does not feel safe to return home at this time due to no support. Will continue to follow.   Follow Up Recommendations  SNF;Supervision for mobility/OOB     Equipment Recommendations  None recommended by PT    Recommendations for Other Services       Precautions / Restrictions Precautions Precautions: Fall Required Braces or Orthoses: Knee Immobilizer - Left Knee Immobilizer - Left: On at all times Restrictions Weight Bearing Restrictions: Yes LLE Weight Bearing: Weight bearing as tolerated    Mobility  Bed Mobility Overal bed mobility: Needs Assistance Bed Mobility: Supine to Sit     Supine to sit: Modified independent (Device/Increase time);HOB elevated     General bed mobility comments: Use of rail to get to EOB wtih increased time, no assist.  Transfers Overall transfer level: Needs assistance Equipment used: Rolling walker (2 wheeled) Transfers: Sit to/from Stand Sit to Stand: Mod assist;Min guard         General transfer comment: Min guard to stand from elevated bed height and Mod A from low chair; good demo of hand placement.  Ambulation/Gait Ambulation/Gait  assistance: Min guard Gait Distance (Feet): 100 Feet (x2 bouts) Assistive device: Rolling walker (2 wheeled) Gait Pattern/deviations: Step-through pattern;Decreased stance time - left;Decreased step length - right;Wide base of support Gait velocity: decreased Gait velocity interpretation: 1.31 - 2.62 ft/sec, indicative of limited community ambulator General Gait Details: Slow, mildly unsteady gait with increased WB through BUEs. 2/4 DOE with activity; 1 seated rest break. better able to advance LLE today   Stairs             Wheelchair Mobility    Modified Rankin (Stroke Patients Only)       Balance Overall balance assessment: Needs assistance Sitting-balance support: Feet supported;No upper extremity supported Sitting balance-Leahy Scale: Fair     Standing balance support: During functional activity Standing balance-Leahy Scale: Poor Standing balance comment: Requires UE support in standing.                            Cognition Arousal/Alertness: Awake/alert Behavior During Therapy: WFL for tasks assessed/performed Overall Cognitive Status: Within Functional Limits for tasks assessed                                        Exercises      General Comments        Pertinent Vitals/Pain Pain Assessment: Faces Faces Pain Scale: Hurts a little bit Pain Location: left hip Pain Descriptors / Indicators: Sore;Operative site guarding Pain Intervention(s): Monitored during session;Repositioned    Home Living  Prior Function            PT Goals (current goals can now be found in the care plan section) Progress towards PT goals: Progressing toward goals    Frequency    Min 3X/week      PT Plan Current plan remains appropriate    Co-evaluation              AM-PAC PT "6 Clicks" Mobility   Outcome Measure  Help needed turning from your back to your side while in a flat bed without using  bedrails?: A Little Help needed moving from lying on your back to sitting on the side of a flat bed without using bedrails?: A Little Help needed moving to and from a bed to a chair (including a wheelchair)?: A Little Help needed standing up from a chair using your arms (e.g., wheelchair or bedside chair)?: A Lot Help needed to walk in hospital room?: A Little Help needed climbing 3-5 steps with a railing? : A Lot 6 Click Score: 16    End of Session Equipment Utilized During Treatment: Gait belt Activity Tolerance: Patient tolerated treatment well Patient left: in chair;with call bell/phone within reach;with chair alarm set Nurse Communication: Mobility status PT Visit Diagnosis: Pain;Difficulty in walking, not elsewhere classified (R26.2);Muscle weakness (generalized) (M62.81) Pain - Right/Left: Left Pain - part of body: Hip     Time: 1607-3710 PT Time Calculation (min) (ACUTE ONLY): 21 min  Charges:  $Gait Training: 8-22 mins                     Marisa Severin, PT, DPT Acute Rehabilitation Services Pager [phone] Office [place] 07/28/2020, 12:51 PM

## 2020-07-28 NOTE — Plan of Care (Signed)

## 2020-07-28 NOTE — Progress Notes (Signed)
PROGRESS NOTE  Mark [name] AYT:[id number] DOB: [birth year] DOA: 07/26/2020 PCP: Delrae Rend, MD   LOS: 0 days   Brief Narrative / Interim history: 59 year old male with chronic kidney disease stage V followed by Dr. Posey Pronto came into the ER after a fall at home with inability to walk.  He was found to have dislocated left hip as well as possible sepsis and pneumonia.  Orthopedics were consulted to reduce the dislocation, and TRH admitted to the hospital.  He was taken to the OR on 9/19 and his dislocation was reduced.  He was also found to be in a flutter with RVR and have acute kidney injury  Subjective / 24h Interval events: No complaints this morning, he tells me he was quite difficult yesterday to work with physical therapy.  Denies any chest pain, denies any palpitations  Assessment & Plan: Principal Problem Close dislocation of the left hip -Following mechanical fall at home, orthopedic surgery consulted and he was taken to the OR on 9/19 status post reduction.  Patient worked with physical therapy postoperatively, reported significant difficulties with activities/ambulation, recommended SNF to which he is agreeable.  TOC consulted  Active Problems Sepsis due to community-acquired pneumonia -Patient met sepsis criteria with elevated WBC, fever, tachycardia, tachypnea and a source -Chest x-ray on admission concerning for right middle lobe pneumonia.  Patient tells me that he has had a cough for the past few days.  He was started on antibiotics with ceftriaxone and doxycycline, continue for now, will narrow to orals on discharge -Respiratory status remains stable today A flutter, new onset, CHA2DS2-VASc score is 1 for hypertension -Telemetry reviewed, he alternates between sinus tachycardia and a flutter with rates up to 140s overnight, case was discussed with Dr. Harl Bowie with cardiology.  He recommends change beta-blocker to metoprolol and recommending anticoagulation with Eliquis in  case he will need to have cardioversion -He had a 2D echo done on 9/19 which showed EF 60-65% -Cardiology will follow as an outpatient -Rates overnight 110-120s, at rest, will increase metoprolol from 25-50 twice daily.  Closely monitor.  Acute kidney injury chronic kidney disease stage IV, POA -Discussed today with nephrology, his most recent outpatient creatinine was in the 3 range, I did not previously have access to these values.  Creatinine during this admission was 4.9 and increasing to 5.3 this morning.  Nephrology has been formally consulted, appreciate input.  Essential hypertension -Monitor blood pressure on metoprolol  Scheduled Meds:  allopurinol  300 mg Oral Daily   apixaban  5 mg Oral BID   doxycycline  100 mg Oral Q12H   furosemide  40 mg Oral BID   metoprolol tartrate  50 mg Oral BID   sodium bicarbonate  650 mg Oral BID   Continuous Infusions:  cefTRIAXone (ROCEPHIN)  IV 1 g (07/28/20 0824)   PRN Meds:.acetaminophen **OR** acetaminophen, HYDROcodone-acetaminophen, metoprolol tartrate, oxyCODONE  Diet Orders (From admission, onward)    Start     Ordered   07/27/20 1028  Diet Heart Room service appropriate? No; Fluid consistency: Thin  Diet effective now       Question Answer Comment  Room service appropriate? No   Fluid consistency: Thin      07/27/20 1029          DVT prophylaxis: SCDs Start: 07/26/20 2029 apixaban (ELIQUIS) tablet 5 mg     Code Status: Full Code  Family Communication: brother at bedside   Status is: Inpatient  Remains inpatient appropriate because:Inpatient level of care appropriate  due to severity of illness   Dispo: The patient is from: Home              Anticipated d/c is to: SNF              Anticipated d/c date is: 2 days              Patient currently is not medically stable to d/c.   Consultants:  Orthopedic surgery  Nephrology  Procedures:  2D echo  Microbiology  none  Antimicrobials: Ceftriaxone   Doxycycline today is day 3, started on 9/18 Ceftriaxone today is day 3, started on 9/18  Objective: Vitals:   07/28/20 0636 07/28/20 0800 07/28/20 0816 07/28/20 0848  BP:  115/71 111/81 101/73  Pulse:  (!) 109 (!) 110 (!) 105  Resp:    20  Temp:  97.6 F (36.4 C)  97.8 F (36.6 C)  TempSrc:  Oral  Oral  SpO2:  98%  99%  Weight: 113 kg     Height:        Intake/Output Summary (Last 24 hours) at 07/28/2020 1135 Last data filed at 07/28/2020 0600 Gross per 24 hour  Intake 820 ml  Output 750 ml  Net 70 ml   Filed Weights   07/26/20 1235 07/28/20 0636  Weight: 115.7 kg 113 kg    Examination:  Constitutional: No distress Eyes: No scleral icterus ENMT: Moist mucous membranes Neck: normal, supple Respiratory: Diminished at the bases, no wheezing, no crackles Cardiovascular: Appears regular, no murmurs appreciated.  No peripheral edema.  Tachycardic Abdomen: Soft, nontender, nondistended, bowel sounds positive Musculoskeletal: no clubbing / cyanosis.  Skin: No rashes appreciated Neurologic: Nonfocal, equal strength  Data Reviewed: I have independently reviewed following labs and imaging studies   CBC: Recent Labs  Lab 07/26/20 1409 07/28/20 0307  WBC 12.0* 12.0*  NEUTROABS 9.8*  --   HGB 9.1* 8.3*  HCT 28.1* 26.6*  MCV 102.6* 104.3*  PLT 202 056   Basic Metabolic Panel: Recent Labs  Lab 07/26/20 1409 07/28/20 0307  NA 139 137  K 4.5 4.5  CL 105 107  CO2 16* 15*  GLUCOSE 109* 136*  BUN 112* 129*  CREATININE 4.93* 5.30*  CALCIUM 12.3* 11.7*  MG 2.0  --    Liver Function Tests: Recent Labs  Lab 07/26/20 1409 07/28/20 0307  AST 45* 37  ALT 15 17  ALKPHOS 73 63  BILITOT 1.0 0.6  PROT 8.7* 7.7  ALBUMIN 3.2* 2.9*   Coagulation Profile: No results for input(s): INR, PROTIME in the last 168 hours. HbA1C: No results for input(s): HGBA1C in the last 72 hours. CBG: No results for input(s): GLUCAP in the last 168 hours.  Recent Results (from the  past 240 hour(s))  Urine culture     Status: Abnormal   Collection Time: 07/26/20 12:44 PM   Specimen: Urine, Random  Result Value Ref Range Status   Specimen Description URINE, RANDOM  Final   Special Requests   Final    NONE Performed at [facility], [address], [place], [place] [address]    Culture 30,000 COLONIES/mL STAPHYLOCOCCUS EPIDERMIDIS (A)  Final   Report Status 07/28/2020 FINAL  Final   Organism ID, Bacteria STAPHYLOCOCCUS EPIDERMIDIS (A)  Final      Susceptibility   Staphylococcus epidermidis - MIC*    CIPROFLOXACIN <=0.5 SENSITIVE Sensitive     GENTAMICIN <=0.5 SENSITIVE Sensitive     NITROFURANTOIN <=16 SENSITIVE Sensitive  OXACILLIN >=4 RESISTANT Resistant     TETRACYCLINE >=16 RESISTANT Resistant     VANCOMYCIN 1 SENSITIVE Sensitive     TRIMETH/SULFA 20 SENSITIVE Sensitive     CLINDAMYCIN >=8 RESISTANT Resistant     RIFAMPIN 1 SENSITIVE Sensitive     Inducible Clindamycin NEGATIVE Sensitive     * 30,000 COLONIES/mL STAPHYLOCOCCUS EPIDERMIDIS  SARS Coronavirus 2 by RT PCR (hospital order, performed in [place] [facility] lab) Nasopharyngeal Nasopharyngeal Swab     Status: None   Collection Time: 07/26/20  2:12 PM   Specimen: Nasopharyngeal Swab  Result Value Ref Range Status   SARS Coronavirus 2 NEGATIVE NEGATIVE Final    Comment: (NOTE) SARS-CoV-2 target nucleic acids are NOT DETECTED.  The SARS-CoV-2 RNA is generally detectable in upper and lower respiratory specimens during the acute phase of infection. The lowest concentration of SARS-CoV-2 viral copies this assay can detect is 250 copies / mL. A negative result does not preclude SARS-CoV-2 infection and should not be used as the sole basis for treatment or other patient management decisions.  A negative result may occur with improper specimen collection / handling, submission of specimen other than nasopharyngeal swab, presence of viral mutation(s) within the areas targeted by this assay,  and inadequate number of viral copies (<250 copies / mL). A negative result must be combined with clinical observations, patient history, and epidemiological information.  Fact Sheet for Patients:   StrictlyIdeas.no  Fact Sheet for Healthcare Providers: BankingDealers.co.za  This test is not yet approved or  cleared by the [place] FDA and has been authorized for detection and/or diagnosis of SARS-CoV-2 by FDA under an Emergency Use Authorization (EUA).  This EUA will remain in effect (meaning this test can be used) for the duration of the COVID-19 declaration under Section 564(b)(1) of the Act, 21 U.S.C. section 360bbb-3(b)(1), unless the authorization is terminated or revoked sooner.  Performed at [facility], [place] [address], [place], [place] [address]   Blood culture (routine x 2)     Status: None (Preliminary result)   Collection Time: 07/26/20  2:57 PM   Specimen: BLOOD  Result Value Ref Range Status   Specimen Description BLOOD RIGHT ANTECUBITAL  Final   Special Requests   Final    BOTTLES DRAWN AEROBIC AND ANAEROBIC Blood Culture adequate volume   Culture   Final    NO GROWTH < 24 HOURS Performed at [facility], [place] [address], [place], [place] [address]    Report Status PENDING  Incomplete  Blood culture (routine x 2)     Status: None (Preliminary result)   Collection Time: 07/26/20  3:02 PM   Specimen: BLOOD LEFT HAND  Result Value Ref Range Status   Specimen Description BLOOD LEFT HAND  Final   Special Requests   Final    BOTTLES DRAWN AEROBIC AND ANAEROBIC Blood Culture adequate volume   Culture   Final    NO GROWTH < 24 HOURS Performed at [facility], [place] [address] [place], [place] [address]    Report Status PENDING  Incomplete  Surgical pcr screen     Status: None   Collection Time: 07/27/20  6:03 AM   Specimen: Nasal Mucosa; Nasal Swab  Result Value Ref Range Status   MRSA, PCR  NEGATIVE NEGATIVE Final   Staphylococcus aureus NEGATIVE NEGATIVE Final    Comment: (NOTE) The Xpert SA Assay (FDA approved for NASAL specimens in patients 62 years of age and older), is  one component of a comprehensive surveillance program. It is not intended to diagnose infection nor to guide or monitor treatment. Performed at [facility], [place] [address] [place], [place] [address]      Radiology Studies: ECHOCARDIOGRAM COMPLETE  Result Date: 07/27/2020    ECHOCARDIOGRAM REPORT   Patient Name:   ARGYLE GUSTAFSON Date of Exam: 07/27/2020 Medical Rec #:  [id number]        Height:       69.0 in Accession #:    [id number]       Weight:       255.0 lb Date of Birth:  [birth year]        BSA:          2.291 m Patient Age:    6 years         BP:           157/81 mmHg Patient Gender: M                HR:           114 bpm. Exam Location:  Inpatient Procedure: 2D Echo, Cardiac Doppler, Color Doppler and Intracardiac            Opacification Agent Indications:    I50.33 Acute on chronic diastolic (congestive) heart failure  History:        Patient has no prior history of Echocardiogram examinations.                 Signs/Symptoms:Dyspnea; Risk Factors:Hypertension. CKD.  Sonographer:    Jonelle Sidle Dance Referring Phys: [id number] Caren Griffins  Sonographer Comments: Suboptimal parasternal window and patient is morbidly obese. IMPRESSIONS  1. Left ventricular ejection fraction, by estimation, is 60 to 65%. The left ventricle has normal function. Left ventricular endocardial border not optimally defined to evaluate regional wall motion. Left ventricular diastolic parameters are indeterminate.  2. Right ventricular systolic function was not well visualized. The right ventricular size is not well visualized.  3. Left atrial size was moderately dilated.  4. The mitral valve was not well visualized. No evidence of mitral valve regurgitation. No evidence of mitral stenosis.  5. The aortic valve was not well  visualized. Aortic valve regurgitation is not visualized. No aortic stenosis is present.  6. The inferior vena cava is normal in size with greater than 50% respiratory variability, suggesting right atrial pressure of 3 mmHg. FINDINGS  Left Ventricle: Left ventricular ejection fraction, by estimation, is 60 to 65%. The left ventricle has normal function. Left ventricular endocardial border not optimally defined to evaluate regional wall motion. The left ventricular internal cavity size was normal in size. There is no left ventricular hypertrophy. Left ventricular diastolic parameters are indeterminate. Right Ventricle: Grossly RV appears normal in size and function. The right ventricular size is not well visualized. Right vetricular wall thickness was not well visualized. Right ventricular systolic function was not well visualized. Left Atrium: Left atrial size was moderately dilated. Right Atrium: Right atrial size was not well visualized. Pericardium: There is no evidence of pericardial effusion. Mitral Valve: The mitral valve was not well visualized. No evidence of mitral valve regurgitation. No evidence of mitral valve stenosis. Tricuspid Valve: The tricuspid valve is not well visualized. Tricuspid valve regurgitation is not demonstrated. No evidence of tricuspid stenosis. Aortic Valve: The aortic valve was not well visualized. Aortic valve regurgitation is not visualized. No aortic stenosis is present. Aortic valve mean gradient measures 4.2 mmHg. Aortic valve peak gradient  measures 8.1 mmHg. Aortic valve area, by VTI measures 2.58 cm. Pulmonic Valve: The pulmonic valve was not well visualized. Pulmonic valve regurgitation is not visualized. No evidence of pulmonic stenosis. Aorta: The aortic root was not well visualized. Pulmonary Artery: Indeterminant PASP, inadequate TR jet. Venous: The inferior vena cava is normal in size with greater than 50% respiratory variability, suggesting right atrial pressure of 3  mmHg. IAS/Shunts: No atrial level shunt detected by color flow Doppler.  LEFT VENTRICLE PLAX 2D LVIDd:         4.20 cm LVIDs:         3.60 cm LV PW:         1.10 cm LV IVS:        0.90 cm LVOT diam:     2.00 cm LV SV:         58 LV SV Index:   25 LVOT Area:     3.14 cm  RIGHT VENTRICLE          IVC RV Basal diam:  2.40 cm  IVC diam: 2.20 cm TAPSE (M-mode): 2.1 cm LEFT ATRIUM              Index       RIGHT ATRIUM           Index LA diam:        4.60 cm  2.01 cm/m  RA Area:     24.60 cm LA Vol (A2C):   109.0 ml 47.58 ml/m RA Volume:   68.40 ml  29.86 ml/m LA Vol (A4C):   82.4 ml  35.97 ml/m LA Biplane Vol: 100.0 ml 43.65 ml/m  AORTIC VALVE AV Area (Vmax):    2.47 cm AV Area (Vmean):   2.26 cm AV Area (VTI):     2.58 cm AV Vmax:           142.37 cm/s AV Vmean:          98.965 cm/s AV VTI:            0.223 m AV Peak Grad:      8.1 mmHg AV Mean Grad:      4.2 mmHg LVOT Vmax:         112.09 cm/s LVOT Vmean:        71.096 cm/s LVOT VTI:          0.183 m LVOT/AV VTI ratio: 0.82  AORTA Ao Asc diam: 3.30 cm MITRAL VALVE MV Area (PHT): 4.07 cm     SHUNTS MV Decel Time: 187 msec     Systemic VTI:  0.18 m MV E velocity: 106.50 cm/s  Systemic Diam: 2.00 cm Carlyle Dolly MD Electronically signed by Carlyle Dolly MD Signature Date/Time: 07/27/2020/4:03:17 PM    Final     Marzetta Board, MD, PhD [facility]  Between 7 am - 7 pm I am available, please contact me via Amion or Securechat  Between 7 pm - 7 am I am not available, please contact night coverage MD/APP via Amion

## 2020-07-29 LAB — IRON AND TIBC
Iron: 58 ug/dL (ref 45–182)
Saturation Ratios: 32 % (ref 17.9–39.5)
TIBC: 181 ug/dL — ABNORMAL LOW (ref 250–450)
UIBC: 123 ug/dL

## 2020-07-29 LAB — CBC
HCT: 24 % — ABNORMAL LOW (ref 39.0–52.0)
Hemoglobin: 7.8 g/dL — ABNORMAL LOW (ref 13.0–17.0)
MCH: 33.9 pg (ref 26.0–34.0)
MCHC: 32.5 g/dL (ref 30.0–36.0)
MCV: 104.3 fL — ABNORMAL HIGH (ref 80.0–100.0)
Platelets: 157 10*3/uL (ref 150–400)
RBC: 2.3 MIL/uL — ABNORMAL LOW (ref 4.22–5.81)
RDW: 13.8 % (ref 11.5–15.5)
WBC: 13.2 10*3/uL — ABNORMAL HIGH (ref 4.0–10.5)
nRBC: 0 % (ref 0.0–0.2)

## 2020-07-29 LAB — BASIC METABOLIC PANEL
Anion gap: 12 (ref 5–15)
BUN: 146 mg/dL — ABNORMAL HIGH (ref 8–23)
CO2: 17 mmol/L — ABNORMAL LOW (ref 22–32)
Calcium: 11.7 mg/dL — ABNORMAL HIGH (ref 8.9–10.3)
Chloride: 106 mmol/L (ref 98–111)
Creatinine, Ser: 5.47 mg/dL — ABNORMAL HIGH (ref 0.61–1.24)
GFR calc Af Amer: 12 mL/min — ABNORMAL LOW (ref >60)
GFR calc non Af Amer: 10 mL/min — ABNORMAL LOW (ref >60)
Glucose, Bld: 136 mg/dL — ABNORMAL HIGH (ref 70–99)
Potassium: 4.6 mmol/L (ref 3.5–5.1)
Sodium: 135 mmol/L (ref 135–145)

## 2020-07-29 LAB — FERRITIN: Ferritin: 767 ng/mL — ABNORMAL HIGH (ref 24–336)

## 2020-07-29 MED ORDER — FUROSEMIDE 40 MG PO TABS
40.0000 mg | ORAL_TABLET | Freq: Every day | ORAL | Status: DC
  Administered 2020-07-30: 40 mg via ORAL
  Filled 2020-07-29: qty 1

## 2020-07-29 MED ORDER — ALUM & MAG HYDROXIDE-SIMETH 200-200-20 MG/5ML PO SUSP
30.0000 mL | Freq: Four times a day (QID) | ORAL | Status: DC | PRN
  Administered 2020-07-29: 30 mL via ORAL
  Filled 2020-07-29: qty 30

## 2020-07-29 MED ORDER — METOPROLOL TARTRATE 50 MG PO TABS
75.0000 mg | ORAL_TABLET | Freq: Two times a day (BID) | ORAL | Status: DC
  Administered 2020-07-29 – 2020-07-31 (×6): 75 mg via ORAL
  Filled 2020-07-29 (×6): qty 1

## 2020-07-29 MED ORDER — DARBEPOETIN ALFA 60 MCG/0.3ML IJ SOSY
60.0000 ug | PREFILLED_SYRINGE | INTRAMUSCULAR | Status: DC
  Administered 2020-07-29: 60 ug via SUBCUTANEOUS
  Filled 2020-07-29: qty 0.3

## 2020-07-29 NOTE — Progress Notes (Signed)
Transitions of Care Pharmacist Note  Mark [name] is a 59 y.o. male that has been diagnosed with A Fib and will be prescribed Eliquis (apixaban) at discharge.   Patient Education: I provided the following education on Apixaban to the patient: How to take the medication Described what the medication is Signs of bleeding Signs/symptoms of VTE and stroke  Answered their questions  Discharge plan: SNF   Thank you,   Norina Buzzard, PharmD PGY1 Pharmacy Resident 07/29/2020 8:04 PM  July 29, 2020

## 2020-07-29 NOTE — Progress Notes (Signed)
PROGRESS NOTE  Mark [name] SWF:[id number] DOB: [birth year] DOA: 07/26/2020 PCP: Delrae Rend, MD   LOS: 1 day   Brief Narrative / Interim history: 59 year old male with chronic kidney disease stage V followed by Dr. Posey Pronto came into the ER after a fall at home with inability to walk.  He was found to have dislocated left hip as well as possible sepsis and pneumonia.  Orthopedics were consulted to reduce the dislocation, and TRH admitted to the hospital.  He was taken to the OR on 9/19 and his dislocation was reduced.  He was also found to be in a flutter with RVR and have acute kidney injury  Subjective / 24h Interval events: Doing well this morning, no complaints, little pain to left hip. No chest pain, no palpitations. Worked well with PT yesterday, did get quite tired  Assessment & Plan: Principal Problem Close dislocation of the left hip -Following mechanical fall at home, orthopedic surgery consulted and he was taken to the OR on 9/19 status post reduction.  Patient worked with physical therapy postoperatively, reported significant difficulties with activities/ambulation, recommended SNF to which he is agreeable.  TOC consulted. SNF placement pending.  Active Problems Sepsis due to community-acquired pneumonia -Patient met sepsis criteria with elevated WBC, fever, tachycardia, tachypnea and a source -Chest x-ray on admission concerning for right middle lobe pneumonia.  Patient tells me that he has had a cough for the past few days.  He was started on antibiotics with ceftriaxone and doxycycline, continue for now, will narrow to orals on discharge -remains on room air  A flutter, new onset, CHA2DS2-VASc score is 1 for hypertension -Telemetry reviewed, he alternates between sinus tachycardia and a flutter with rates up to 140s overnight, case was discussed with Dr. Harl Bowie with cardiology.  He recommends change beta-blocker to metoprolol and recommending anticoagulation with Eliquis in  case he will need to have cardioversion -He had a 2D echo done on 9/19 which showed EF 60-65% -Cardiology will follow as an outpatient -rates remain unchanged, ranging 90-110 at rest up to 120-130s with ambulation. Will uptitrate Metoprolol to 75 BID and monitor  Acute kidney injury chronic kidney disease stage V, POA -Discussed today with nephrology, his most recent outpatient creatinine was in the 3 range, I did not previously have access to these values.  Creatinine during this admission was 4.9 and continues to increase, even worse this morning -nephrology following  Essential hypertension -Monitor blood pressure on metoprolol, currently stable  Scheduled Meds: . allopurinol  300 mg Oral Daily  . apixaban  5 mg Oral BID  . doxycycline  100 mg Oral Q12H  . furosemide  40 mg Oral BID  . metoprolol tartrate  75 mg Oral BID  . sodium bicarbonate  650 mg Oral BID   Continuous Infusions: . cefTRIAXone (ROCEPHIN)  IV Stopped (07/28/20 1619)   PRN Meds:.acetaminophen **OR** acetaminophen, HYDROcodone-acetaminophen, metoprolol tartrate  Diet Orders (From admission, onward)    Start     Ordered   07/27/20 1028  Diet Heart Room service appropriate? No; Fluid consistency: Thin  Diet effective now       Question Answer Comment  Room service appropriate? No   Fluid consistency: Thin      07/27/20 1029          DVT prophylaxis: SCDs Start: 07/26/20 2029 apixaban (ELIQUIS) tablet 5 mg     Code Status: Full Code  Family Communication: no family at bedside   Status is: Inpatient  Remains inpatient appropriate  because:Inpatient level of care appropriate due to severity of illness  Dispo: The patient is from: Home              Anticipated d/c is to: SNF              Anticipated d/c date is: 2 days              Patient currently is not medically stable to d/c.   Consultants:  Orthopedic surgery  Nephrology  Procedures:  2D echo  Microbiology   none  Antimicrobials: Ceftriaxone  Doxycycline today is day 4, started on 9/18 Ceftriaxone today is day 4, started on 9/18  Objective: Vitals:   07/28/20 1622 07/28/20 1900 07/28/20 2108 07/29/20 0000  BP: 115/76  (!) 128/95 110/77  Pulse: (!) 113  (!) 105 97  Resp: _0 Temp: 98.6 F (37 C)  98.5 F (36.9 C) 97.6 F (36.4 C)  TempSrc: Oral  Oral Oral  SpO2:   100% 97%  Weight:      Height:        Intake/Output Summary (Last 24 hours) at 07/29/2020 0644 Last data filed at 07/29/2020 0500 Gross per 24 hour  Intake --  Output 875 ml  Net -875 ml   Filed Weights   07/26/20 1235 07/28/20 0636  Weight: 115.7 kg 113 kg    Examination:  Constitutional: NAD Eyes: no icterus ENMT: mmm Neck: normal, supple Respiratory: clear, no wheezing, no crackles Cardiovascular: regular, no murmurs appreciated. Abdomen: soft, nt, nd, bs+ Musculoskeletal: no clubbing / cyanosis.  Skin: no rashes Neurologic: non focal   Data Reviewed: I have independently reviewed following labs and imaging studies   CBC: Recent Labs  Lab 07/26/20 1409 07/28/20 0307 07/29/20 0315  WBC 12.0* 12.0* 13.2*  NEUTROABS 9.8*  --   --   HGB 9.1* 8.3* 7.8*  HCT 28.1* 26.6* 24.0*  MCV 102.6* 104.3* 104.3*  PLT 202 164 141   Basic Metabolic Panel: Recent Labs  Lab 07/26/20 1409 07/28/20 0307 07/29/20 0315  NA 139 137 135  K 4.5 4.5 4.6  CL 105 107 106  CO2 16* 15* 17*  GLUCOSE 109* 136* 136*  BUN 112* 129* 146*  CREATININE 4.93* 5.30* 5.47*  CALCIUM 12.3* 11.7* 11.7*  MG 2.0  --   --    Liver Function Tests: Recent Labs  Lab 07/26/20 1409 07/28/20 0307  AST 45* 37  ALT 15 17  ALKPHOS 73 63  BILITOT 1.0 0.6  PROT 8.7* 7.7  ALBUMIN 3.2* 2.9*   Coagulation Profile: No results for input(s): INR, PROTIME in the last 168 hours. HbA1C: No results for input(s): HGBA1C in the last 72 hours. CBG: No results for input(s): GLUCAP in the last 168 hours.  Recent Results (from  the past 240 hour(s))  Urine culture     Status: Abnormal   Collection Time: 07/26/20 12:44 PM   Specimen: Urine, Random  Result Value Ref Range Status   Specimen Description URINE, RANDOM  Final   Special Requests   Final    NONE Performed at [facility], [address] [place], [place] [address]    Culture 30,000 COLONIES/mL STAPHYLOCOCCUS EPIDERMIDIS (A)  Final   Report Status 07/28/2020 FINAL  Final   Organism ID, Bacteria STAPHYLOCOCCUS EPIDERMIDIS (A)  Final      Susceptibility   Staphylococcus epidermidis - MIC*    CIPROFLOXACIN <=0.5 SENSITIVE Sensitive     GENTAMICIN <=0.5 SENSITIVE Sensitive  NITROFURANTOIN <=16 SENSITIVE Sensitive     OXACILLIN >=4 RESISTANT Resistant     TETRACYCLINE >=16 RESISTANT Resistant     VANCOMYCIN 1 SENSITIVE Sensitive     TRIMETH/SULFA 20 SENSITIVE Sensitive     CLINDAMYCIN >=8 RESISTANT Resistant     RIFAMPIN 1 SENSITIVE Sensitive     Inducible Clindamycin NEGATIVE Sensitive     * 30,000 COLONIES/mL STAPHYLOCOCCUS EPIDERMIDIS  SARS Coronavirus 2 by RT PCR (hospital order, performed in [place] hospital lab) Nasopharyngeal Nasopharyngeal Swab     Status: None   Collection Time: 07/26/20  2:12 PM   Specimen: Nasopharyngeal Swab  Result Value Ref Range Status   SARS Coronavirus 2 NEGATIVE NEGATIVE Final    Comment: (NOTE) SARS-CoV-2 target nucleic acids are NOT DETECTED.  The SARS-CoV-2 RNA is generally detectable in upper and lower respiratory specimens during the acute phase of infection. The lowest concentration of SARS-CoV-2 viral copies this assay can detect is 250 copies / mL. A negative result does not preclude SARS-CoV-2 infection and should not be used as the sole basis for treatment or other patient management decisions.  A negative result may occur with improper specimen collection / handling, submission of specimen other than nasopharyngeal swab, presence of viral mutation(s) within the areas targeted by this  assay, and inadequate number of viral copies (<250 copies / mL). A negative result must be combined with clinical observations, patient history, and epidemiological information.  Fact Sheet for Patients:   StrictlyIdeas.no  Fact Sheet for Healthcare Providers: BankingDealers.co.za  This test is not yet approved or  cleared by the [place] FDA and has been authorized for detection and/or diagnosis of SARS-CoV-2 by FDA under an Emergency Use Authorization (EUA).  This EUA will remain in effect (meaning this test can be used) for the duration of the COVID-19 declaration under Section 564(b)(1) of the Act, 21 U.S.C. section 360bbb-3(b)(1), unless the authorization is terminated or revoked sooner.  Performed at [facility], [place] [address] [place], [place] [address]   Blood culture (routine x 2)     Status: None (Preliminary result)   Collection Time: 07/26/20  2:57 PM   Specimen: BLOOD  Result Value Ref Range Status   Specimen Description BLOOD RIGHT ANTECUBITAL  Final   Special Requests   Final    BOTTLES DRAWN AEROBIC AND ANAEROBIC Blood Culture adequate volume   Culture   Final    NO GROWTH 2 DAYS Performed at [facility], [place] [address] [place], [place] [address]    Report Status PENDING  Incomplete  Blood culture (routine x 2)     Status: None (Preliminary result)   Collection Time: 07/26/20  3:02 PM   Specimen: BLOOD LEFT HAND  Result Value Ref Range Status   Specimen Description BLOOD LEFT HAND  Final   Special Requests   Final    BOTTLES DRAWN AEROBIC AND ANAEROBIC Blood Culture adequate volume   Culture   Final    NO GROWTH 2 DAYS Performed at [facility], [place] [address], [place], [place] [address]    Report Status PENDING  Incomplete  Surgical pcr screen     Status: None   Collection Time: 07/27/20  6:03 AM   Specimen: Nasal Mucosa; Nasal Swab  Result Value Ref Range Status   MRSA, PCR  NEGATIVE NEGATIVE Final   Staphylococcus aureus NEGATIVE NEGATIVE Final    Comment: (NOTE) The Xpert SA Assay (FDA approved for NASAL specimens in patients 22  years of age and older), is one component of a comprehensive surveillance program. It is not intended to diagnose infection nor to guide or monitor treatment. Performed at [facility], [place] [address], [place], [place] [address]      Radiology Studies: No results found.  Marzetta Board, MD, PhD [facility]  Between 7 am - 7 pm I am available, please contact me via Amion or Securechat  Between 7 pm - 7 am I am not available, please contact night coverage MD/APP via Amion

## 2020-07-29 NOTE — Progress Notes (Addendum)
Physical Therapy Treatment Patient Details Name: Mark [name] MRN: [id number] DOB: 18-Sep-1961 Today's Date: 07/29/2020    History of Present Illness Patient is a 59 y/o male admitted 07/26/20 with left hip dislocation s/p fall; s/p L closed hip reduction 9/19. Additional workup for sepsis and PNA; new onser A-flutter. PMH includes CKD, HTN, osteoporosis, tobacco use, arthritis.   PT Comments    Pt progressing with mobility. Today's session focused on ambulation and LE strengthening. Pt remains limited by generalized weakness, decreased activity tolerance and impaired balance strategies; at high risk for falls. Continue to recommend SNF-level therapies to maximize functional mobility and independence prior to return home.    Follow Up Recommendations  SNF;Supervision for mobility/OOB     Equipment Recommendations  None recommended by PT    Recommendations for Other Services       Precautions / Restrictions Precautions Precautions: Fall Required Braces or Orthoses: Knee Immobilizer - Left Restrictions Weight Bearing Restrictions: Yes LLE Weight Bearing: Weight bearing as tolerated    Mobility  Bed Mobility Overal bed mobility: Needs Assistance Bed Mobility: Supine to Sit;Sit to Supine     Supine to sit: Modified independent (Device/Increase time);HOB elevated Sit to supine: Min assist   General bed mobility comments: Reliant on use of bed rail, increased time, use of BUEs to assist LLE to EOB; assist to return LLE to supine  Transfers Overall transfer level: Needs assistance Equipment used: Rolling walker (2 wheeled) Transfers: Sit to/from Stand Sit to Stand: Min guard         General transfer comment: Reliant on momentum and UE support to power into standing from EOB to RW, close min guard for balance  Ambulation/Gait Ambulation/Gait assistance: Min guard Gait Distance (Feet): 110 Feet Assistive device: Rolling walker (2 wheeled) Gait Pattern/deviations:  Step-through pattern;Wide base of support;Decreased stride length Gait velocity: Decreased   General Gait Details: Slow, mildly unsteady gait with increased WB through BUEs; no DOE noted this session; improving ability to advance LLE   Stairs             Wheelchair Mobility    Modified Rankin (Stroke Patients Only)       Balance Overall balance assessment: Needs assistance Sitting-balance support: Feet supported;No upper extremity supported Sitting balance-Leahy Scale: Good     Standing balance support: During functional activity Standing balance-Leahy Scale: Poor Standing balance comment: Reliant on UE support                            Cognition Arousal/Alertness: Awake/alert Behavior During Therapy: WFL for tasks assessed/performed Overall Cognitive Status: Within Functional Limits for tasks assessed                                        Exercises General Exercises - Lower Extremity Long Arc Quad: AROM;Left;Seated Heel Slides: AROM;Left;Seated Hip Flexion/Marching: AROM;Left;Seated    General Comments General comments (skin integrity, edema, etc.): KI removed for skin check; left off and pt positioned in supine with LLE in neutral IR and knee extension      Pertinent Vitals/Pain Pain Assessment: No/denies pain Pain Intervention(s): Monitored during session    Home Living                      Prior Function            PT Goals (  current goals can now be found in the care plan section) Progress towards PT goals: Goals met and updated - see care plan    Frequency    Min 3X/week      PT Plan Current plan remains appropriate    Co-evaluation              AM-PAC PT "6 Clicks" Mobility   Outcome Measure  Help needed turning from your back to your side while in a flat bed without using bedrails?: A Little Help needed moving from lying on your back to sitting on the side of a flat bed without using  bedrails?: A Little Help needed moving to and from a bed to a chair (including a wheelchair)?: A Little Help needed standing up from a chair using your arms (e.g., wheelchair or bedside chair)?: A Little Help needed to walk in hospital room?: A Little Help needed climbing 3-5 steps with a railing? : A Lot 6 Click Score: 17    End of Session Equipment Utilized During Treatment: Gait belt;Left knee immobilizer Activity Tolerance: Patient tolerated treatment well Patient left: in bed;with call bell/phone within reach;with bed alarm set (declined transfer to recliner having recently gotten back to bed) Nurse Communication: Mobility status PT Visit Diagnosis: Pain;Difficulty in walking, not elsewhere classified (R26.2);Muscle weakness (generalized) (M62.81)     Time: 1355-1416 PT Time Calculation (min) (ACUTE ONLY): 21 min  Charges:  $Therapeutic Exercise: 8-22 mins                    Mabeline Caras, PT, DPT Acute Rehabilitation Services  Pager [phone] Office [place] 07/29/2020, 4:28 PM

## 2020-07-29 NOTE — TOC Progression Note (Signed)
Transition of Care [facility]) - Progression Note    Patient Details  Name: Mark [name] MRN: [id number] Date of Birth: 13-Nov-1960  Transition of Care [facility]) CM/SW [place], [place] Phone Number: 07/29/2020, 11:28 AM  Clinical Narrative:     CSW provided patient with SNF bed offers at bedside. CSW discussed insurance authorization process and provided Medicare SNF ratings list.  Patient chose [facility] for SNF placement. CSW called [facility] to confirmed bed offer. [facility] confirmed that they can take patient for SNF placement. No further questions reported at this time. CSW to continue to follow and assist with discharge planning needs.  Patient has SNF bed at [facility]. Facility gets [facility].  CSW will continue to follow. Expected Discharge Plan: [place] Barriers to Discharge: Continued Medical Work up  Expected Discharge Plan and Services Expected Discharge Plan: [place] arrangements for the past 2 months: Single Family Home                                       Social Determinants of Health (SDOH) Interventions    Readmission Risk Interventions No flowsheet data found.

## 2020-07-29 NOTE — TOC CAGE-AID Note (Signed)
Transition of Care [facility]) - CAGE-AID Screening   Patient Details  Name: Mark [name] MRN: [id number] Date of Birth: 02-01-1961  Transition of Care [facility]) CM/SW Contact:    Emeterio Reeve, [place] Phone Number: 07/29/2020, 1:32 PM   Clinical Narrative:  CSW met with pt at bedside. CSW introduced self and explained her role at the hospital.  Pt reports he drinks a beer or 2 when he watches a football game. Pt denies substance use. Pt did not need resources at this time.   CAGE-AID Screening:    Have You Ever Felt You Ought to Cut Down on Your Drinking or Drug Use?: No Have People Annoyed You By Critizing Your Drinking Or Drug Use?: No Have You Felt Bad Or Guilty About Your Drinking Or Drug Use?: No Have You Ever Had a Drink or Used Drugs First Thing In The Morning to Steady Your Nerves or to Get Rid of a Hangover?: No CAGE-AID Score: 0  Substance Abuse Education Offered: Yes  Substance abuse interventions: Patient Counseling  Emeterio Reeve, Latanya Presser, [place] Social Worker [phone]

## 2020-07-29 NOTE — Discharge Instructions (Signed)
Keep immobilizer in place, avoid deep hip flexion.  Apixaban oral tablets What is this medicine? APIXABAN (a PIX a ban) is an anticoagulant (blood thinner). It is used to lower the chance of stroke in people with a medical condition called atrial fibrillation. It is also used to treat or prevent blood clots in the lungs or in the veins. This medicine may be used for other purposes; ask your health care provider or pharmacist if you have questions. COMMON BRAND NAME(S): Eliquis What should I tell my health care provider before I take this medicine? They need to know if you have any of these conditions:  antiphospholipid antibody syndrome  bleeding disorders  bleeding in the brain  blood in your stools (black or tarry stools) or if you have blood in your vomit  history of blood clots  history of stomach bleeding  kidney disease  liver disease  mechanical heart valve  an unusual or allergic reaction to apixaban, other medicines, foods, dyes, or preservatives  pregnant or trying to get pregnant  breast-feeding How should I use this medicine? Take this medicine by mouth with a glass of water. Follow the directions on the prescription label. You can take it with or without food. If it upsets your stomach, take it with food. Take your medicine at regular intervals. Do not take it more often than directed. Do not stop taking except on your doctor's advice. Stopping this medicine may increase your risk of a blood clot. Be sure to refill your prescription before you run out of medicine. Talk to your pediatrician regarding the use of this medicine in children. Special care may be needed. Overdosage: If you think you have taken too much of this medicine contact a poison control center or emergency room at once. NOTE: This medicine is only for you. Do not share this medicine with others. What if I miss a dose? If you miss a dose, take it as soon as you can. If it is almost time for your  next dose, take only that dose. Do not take double or extra doses. What may interact with this medicine? This medicine may interact with the following:  aspirin and aspirin-like medicines  certain medicines for fungal infections like ketoconazole and itraconazole  certain medicines for seizures like carbamazepine and phenytoin  certain medicines that treat or prevent blood clots like warfarin, enoxaparin, and dalteparin  clarithromycin  NSAIDs, medicines for pain and inflammation, like ibuprofen or naproxen  rifampin  ritonavir  St. John's wort This list may not describe all possible interactions. Give your health care provider a list of all the medicines, herbs, non-prescription drugs, or dietary supplements you use. Also tell them if you smoke, drink alcohol, or use illegal drugs. Some items may interact with your medicine. What should I watch for while using this medicine? Visit your healthcare professional for regular checks on your progress. You may need blood work done while you are taking this medicine. Your condition will be monitored carefully while you are receiving this medicine. It is important not to miss any appointments. Avoid sports and activities that might cause injury while you are using this medicine. Severe falls or injuries can cause unseen bleeding. Be careful when using sharp tools or knives. Consider using an Copy. Take special care brushing or flossing your teeth. Report any injuries, bruising, or red spots on the skin to your healthcare professional. If you are going to need surgery or other procedure, tell your healthcare professional that you  are taking this medicine. Wear a medical ID bracelet or chain. Carry a card that describes your disease and details of your medicine and dosage times. What side effects may I notice from receiving this medicine? Side effects that you should report to your doctor or health care professional as soon as  possible:  allergic reactions like skin rash, itching or hives, swelling of the face, lips, or tongue  signs and symptoms of bleeding such as bloody or black, tarry stools; red or dark-brown urine; spitting up blood or brown material that looks like coffee grounds; red spots on the skin; unusual bruising or bleeding from the eye, gums, or nose  signs and symptoms of a blood clot such as chest pain; shortness of breath; pain, swelling, or warmth in the leg  signs and symptoms of a stroke such as changes in vision; confusion; trouble speaking or understanding; severe headaches; sudden numbness or weakness of the face, arm or leg; trouble walking; dizziness; loss of coordination This list may not describe all possible side effects. Call your doctor for medical advice about side effects. You may report side effects to FDA at 1-800-FDA-1088. Where should I keep my medicine? Keep out of the reach of children. Store at room temperature between 20 and 25 degrees C (68 and 77 degrees F). Throw away any unused medicine after the expiration date. NOTE: This sheet is a summary. It may not cover all possible information. If you have questions about this medicine, talk to your doctor, pharmacist, or health care provider.  2020 Elsevier/Gold Standard (2018-07-05 17:39:34)

## 2020-07-29 NOTE — Progress Notes (Addendum)
Patient ID: Mark [name], male   DOB: May 07, 1961, 59 y.o.   MRN: [id number] S: PT feels better today.  No N/V/dysgeusia, SOB. O:BP (!) 130/91 (BP Location: Right Arm)   Pulse 98   Temp (!) 97.4 F (36.3 C) (Oral)   Resp 19   Ht 5\' 9"  (1.753 m)   Wt 114.2 kg   SpO2 100%   BMI 37.18 kg/m   Intake/Output Summary (Last 24 hours) at 07/29/2020 1422 Last data filed at 07/29/2020 1315 Gross per 24 hour  Intake 360 ml  Output 1425 ml  Net -1065 ml   Intake/Output: I/O last 3 completed shifts: In: 82 [P.O.:720; IV Piggyback:100] Out: 1925 [Urine:1925]  Intake/Output this shift:  Total I/O In: 360 [P.O.:360] Out: 250 [Urine:250] Weight change: 1.179 kg Gen: NAD CVS: RRR no rub Resp: cta Abd: +BS, soft, obese, NT Ext: no edema, L forearm AVF +T/B  Recent Labs  Lab 07/26/20 1409 07/28/20 0307 07/29/20 0315  NA 139 137 135  K 4.5 4.5 4.6  CL 105 107 106  CO2 16* 15* 17*  GLUCOSE 109* 136* 136*  BUN 112* 129* 146*  CREATININE 4.93* 5.30* 5.47*  ALBUMIN 3.2* 2.9*  --   CALCIUM 12.3* 11.7* 11.7*  AST 45* 37  --   ALT 15 17  --    Liver Function Tests: Recent Labs  Lab 07/26/20 1409 07/28/20 0307  AST 45* 37  ALT 15 17  ALKPHOS 73 63  BILITOT 1.0 0.6  PROT 8.7* 7.7  ALBUMIN 3.2* 2.9*   No results for input(s): LIPASE, AMYLASE in the last 168 hours. No results for input(s): AMMONIA in the last 168 hours. CBC: Recent Labs  Lab 07/26/20 1409 07/28/20 0307 07/29/20 0315  WBC 12.0* 12.0* 13.2*  NEUTROABS 9.8*  --   --   HGB 9.1* 8.3* 7.8*  HCT 28.1* 26.6* 24.0*  MCV 102.6* 104.3* 104.3*  PLT 202 164 157   Cardiac Enzymes: Recent Labs  Lab 07/26/20 1409 07/27/20 0212  CKTOTAL 958* 690*   CBG: No results for input(s): GLUCAP in the last 168 hours.  Iron Studies:  Recent Labs    07/29/20 0315  IRON 58  TIBC 181*  FERRITIN 767*   Studies/Results: ECHOCARDIOGRAM COMPLETE  Result Date: 07/27/2020    ECHOCARDIOGRAM REPORT   Patient Name:    Mark [name] Date of Exam: 07/27/2020 Medical Rec #:  [id number]        Height:       69.0 in Accession #:    [id number]       Weight:       255.0 lb Date of Birth:  03-08-1961        BSA:          2.291 m Patient Age:    38 years         BP:           157/81 mmHg Patient Gender: M                HR:           114 bpm. Exam Location:  Inpatient Procedure: 2D Echo, Cardiac Doppler, Color Doppler and Intracardiac            Opacification Agent Indications:    I50.33 Acute on chronic diastolic (congestive) heart failure  History:        Patient has no prior history of Echocardiogram examinations.  Signs/Symptoms:Dyspnea; Risk Factors:Hypertension. CKD.  Sonographer:    Jonelle Sidle Dance Referring Phys: [id number] Caren Griffins  Sonographer Comments: Suboptimal parasternal window and patient is morbidly obese. IMPRESSIONS  1. Left ventricular ejection fraction, by estimation, is 60 to 65%. The left ventricle has normal function. Left ventricular endocardial border not optimally defined to evaluate regional wall motion. Left ventricular diastolic parameters are indeterminate.  2. Right ventricular systolic function was not well visualized. The right ventricular size is not well visualized.  3. Left atrial size was moderately dilated.  4. The mitral valve was not well visualized. No evidence of mitral valve regurgitation. No evidence of mitral stenosis.  5. The aortic valve was not well visualized. Aortic valve regurgitation is not visualized. No aortic stenosis is present.  6. The inferior vena cava is normal in size with greater than 50% respiratory variability, suggesting right atrial pressure of 3 mmHg. FINDINGS  Left Ventricle: Left ventricular ejection fraction, by estimation, is 60 to 65%. The left ventricle has normal function. Left ventricular endocardial border not optimally defined to evaluate regional wall motion. The left ventricular internal cavity size was normal in size. There is no left  ventricular hypertrophy. Left ventricular diastolic parameters are indeterminate. Right Ventricle: Grossly RV appears normal in size and function. The right ventricular size is not well visualized. Right vetricular wall thickness was not well visualized. Right ventricular systolic function was not well visualized. Left Atrium: Left atrial size was moderately dilated. Right Atrium: Right atrial size was not well visualized. Pericardium: There is no evidence of pericardial effusion. Mitral Valve: The mitral valve was not well visualized. No evidence of mitral valve regurgitation. No evidence of mitral valve stenosis. Tricuspid Valve: The tricuspid valve is not well visualized. Tricuspid valve regurgitation is not demonstrated. No evidence of tricuspid stenosis. Aortic Valve: The aortic valve was not well visualized. Aortic valve regurgitation is not visualized. No aortic stenosis is present. Aortic valve mean gradient measures 4.2 mmHg. Aortic valve peak gradient measures 8.1 mmHg. Aortic valve area, by VTI measures 2.58 cm. Pulmonic Valve: The pulmonic valve was not well visualized. Pulmonic valve regurgitation is not visualized. No evidence of pulmonic stenosis. Aorta: The aortic root was not well visualized. Pulmonary Artery: Indeterminant PASP, inadequate TR jet. Venous: The inferior vena cava is normal in size with greater than 50% respiratory variability, suggesting right atrial pressure of 3 mmHg. IAS/Shunts: No atrial level shunt detected by color flow Doppler.  LEFT VENTRICLE PLAX 2D LVIDd:         4.20 cm LVIDs:         3.60 cm LV PW:         1.10 cm LV IVS:        0.90 cm LVOT diam:     2.00 cm LV SV:         58 LV SV Index:   25 LVOT Area:     3.14 cm  RIGHT VENTRICLE          IVC RV Basal diam:  2.40 cm  IVC diam: 2.20 cm TAPSE (M-mode): 2.1 cm LEFT ATRIUM              Index       RIGHT ATRIUM           Index LA diam:        4.60 cm  2.01 cm/m  RA Area:     24.60 cm LA Vol (A2C):   109.0 ml 47.58  ml/m RA Volume:  68.40 ml  29.86 ml/m LA Vol (A4C):   82.4 ml  35.97 ml/m LA Biplane Vol: 100.0 ml 43.65 ml/m  AORTIC VALVE AV Area (Vmax):    2.47 cm AV Area (Vmean):   2.26 cm AV Area (VTI):     2.58 cm AV Vmax:           142.37 cm/s AV Vmean:          98.965 cm/s AV VTI:            0.223 m AV Peak Grad:      8.1 mmHg AV Mean Grad:      4.2 mmHg LVOT Vmax:         112.09 cm/s LVOT Vmean:        71.096 cm/s LVOT VTI:          0.183 m LVOT/AV VTI ratio: 0.82  AORTA Ao Asc diam: 3.30 cm MITRAL VALVE MV Area (PHT): 4.07 cm     SHUNTS MV Decel Time: 187 msec     Systemic VTI:  0.18 m MV E velocity: 106.50 cm/s  Systemic Diam: 2.00 cm Carlyle Dolly MD Electronically signed by Carlyle Dolly MD Signature Date/Time: 07/27/2020/4:03:17 PM    Final    . allopurinol  300 mg Oral Daily  . apixaban  5 mg Oral BID  . doxycycline  100 mg Oral Q12H  . [START ON 07/30/2020] furosemide  40 mg Oral Daily  . metoprolol tartrate  75 mg Oral BID  . sodium bicarbonate  650 mg Oral BID    BMET    Component Value Date/Time   NA 135 07/29/2020 0315   K 4.6 07/29/2020 0315   CL 106 07/29/2020 0315   CO2 17 (L) 07/29/2020 0315   GLUCOSE 136 (H) 07/29/2020 0315   BUN 146 (H) 07/29/2020 0315   CREATININE 5.47 (H) 07/29/2020 0315   CALCIUM 11.7 (H) 07/29/2020 0315   GFRNONAA 10 (L) 07/29/2020 0315   GFRAA 12 (L) 07/29/2020 0315   CBC    Component Value Date/Time   WBC 13.2 (H) 07/29/2020 0315   RBC 2.30 (L) 07/29/2020 0315   HGB 7.8 (L) 07/29/2020 0315   HCT 24.0 (L) 07/29/2020 0315   PLT 157 07/29/2020 0315   MCV 104.3 (H) 07/29/2020 0315   MCH 33.9 07/29/2020 0315   MCHC 32.5 07/29/2020 0315   RDW 13.8 07/29/2020 0315   LYMPHSABS 1.2 07/26/2020 1409   MONOABS 0.9 07/26/2020 1409   EOSABS 0.0 07/26/2020 1409   BASOSABS 0.0 07/26/2020 1409    Assessment/Plan: 1.  AKI/CKD stage IV- presumably due to ischemic ATN in setting of sepsis from CAP as well as atrial flutter with RVR and hypotension.   He is currently without uremic symptoms.  Will continue to follow closely.  He does have a mature L AVF in place if dialysis needs to be initiated during this hospitalization. 1. Decrease lasix to once daily due to lack of edema and rising BUN/Cr 2. No uremic symptoms. 3. Recheck renal panel in am 4. Will need to follow closely with Dr. Posey Pronto in our office after discharge..  2. Sepsis due to CAP- on abx per primary 3. New onset atrial flutter- cardiology to evaluate and possible anticoagulation and cardioversion.   4. Left hip dislocation- s/p closed reduction of left hip 07/27/20. 5. Anemia of CKD stage IV- will check iron stores and will likely need to start esa 1. TSAT ok at 35% 2. Will start aranesp and will need  to arrange outpatient ESA once discharged. 6. Metabolic acidosis- was on sodium bicarb at home.  Resume and follow.  7. SHPTH- will check phos and iPTH. 8. Perineum ulcerations- will need wound care. 9. Disposition- will need SNF placement as he lives alone and are awaiting beds.   Donetta Potts, MD [employer] [phone]

## 2020-07-30 LAB — BASIC METABOLIC PANEL
Anion gap: 13 (ref 5–15)
BUN: 150 mg/dL — ABNORMAL HIGH (ref 8–23)
CO2: 18 mmol/L — ABNORMAL LOW (ref 22–32)
Calcium: 11.2 mg/dL — ABNORMAL HIGH (ref 8.9–10.3)
Chloride: 107 mmol/L (ref 98–111)
Creatinine, Ser: 5.25 mg/dL — ABNORMAL HIGH (ref 0.61–1.24)
GFR calc Af Amer: 13 mL/min — ABNORMAL LOW (ref >60)
GFR calc non Af Amer: 11 mL/min — ABNORMAL LOW (ref >60)
Glucose, Bld: 112 mg/dL — ABNORMAL HIGH (ref 70–99)
Potassium: 4.4 mmol/L (ref 3.5–5.1)
Sodium: 138 mmol/L (ref 135–145)

## 2020-07-30 LAB — CBC
HCT: 26.4 % — ABNORMAL LOW (ref 39.0–52.0)
Hemoglobin: 8.3 g/dL — ABNORMAL LOW (ref 13.0–17.0)
MCH: 33.3 pg (ref 26.0–34.0)
MCHC: 31.4 g/dL (ref 30.0–36.0)
MCV: 106 fL — ABNORMAL HIGH (ref 80.0–100.0)
Platelets: 176 10*3/uL (ref 150–400)
RBC: 2.49 MIL/uL — ABNORMAL LOW (ref 4.22–5.81)
RDW: 13.8 % (ref 11.5–15.5)
WBC: 13.7 10*3/uL — ABNORMAL HIGH (ref 4.0–10.5)
nRBC: 0 % (ref 0.0–0.2)

## 2020-07-30 MED ORDER — SODIUM CHLORIDE 0.9 % IV SOLN: INTRAVENOUS | Status: DC

## 2020-07-30 NOTE — Progress Notes (Signed)
Patient ID: Mark [name], male   DOB: 01-Mar-1961, 59 y.o.   MRN: [id number] S: Feels well, no complaints O:BP 111/88 (BP Location: Right Wrist)   Pulse (!) 116   Temp 97.9 F (36.6 C) (Oral)   Resp 18   Ht 5\' 9"  (1.753 m)   Wt 114.2 kg   SpO2 100%   BMI 37.18 kg/m   Intake/Output Summary (Last 24 hours) at 07/30/2020 1121 Last data filed at 07/30/2020 0859 Gross per 24 hour  Intake 840 ml  Output 1350 ml  Net -510 ml   Intake/Output: I/O last 3 completed shifts: In: 600 [P.O.:600] Out: 2200 [Urine:2200]  Intake/Output this shift:  Total I/O In: 480 [P.O.:480] Out: 250 [Urine:250] Weight change:  Gen: NAD CVS: tachy at 116 Resp: cta Abd: +BS, soft, NT/ND Ext: no edema  Recent Labs  Lab 07/26/20 1409 07/28/20 0307 07/29/20 0315  NA 139 137 135  K 4.5 4.5 4.6  CL 105 107 106  CO2 16* 15* 17*  GLUCOSE 109* 136* 136*  BUN 112* 129* 146*  CREATININE 4.93* 5.30* 5.47*  ALBUMIN 3.2* 2.9*  --   CALCIUM 12.3* 11.7* 11.7*  AST 45* 37  --   ALT 15 17  --    Liver Function Tests: Recent Labs  Lab 07/26/20 1409 07/28/20 0307  AST 45* 37  ALT 15 17  ALKPHOS 73 63  BILITOT 1.0 0.6  PROT 8.7* 7.7  ALBUMIN 3.2* 2.9*   No results for input(s): LIPASE, AMYLASE in the last 168 hours. No results for input(s): AMMONIA in the last 168 hours. CBC: Recent Labs  Lab 07/26/20 1409 07/28/20 0307 07/29/20 0315  WBC 12.0* 12.0* 13.2*  NEUTROABS 9.8*  --   --   HGB 9.1* 8.3* 7.8*  HCT 28.1* 26.6* 24.0*  MCV 102.6* 104.3* 104.3*  PLT 202 164 157   Cardiac Enzymes: Recent Labs  Lab 07/26/20 1409 07/27/20 0212  CKTOTAL 958* 690*   CBG: No results for input(s): GLUCAP in the last 168 hours.  Iron Studies:  Recent Labs    07/29/20 0315  IRON 58  TIBC 181*  FERRITIN 767*   Studies/Results: No results found. Marland Kitchen allopurinol  300 mg Oral Daily  . apixaban  5 mg Oral BID  . darbepoetin (ARANESP) injection - NON-DIALYSIS  60 mcg Subcutaneous Q14 Days  .  doxycycline  100 mg Oral Q12H  . furosemide  40 mg Oral Daily  . metoprolol tartrate  75 mg Oral BID  . sodium bicarbonate  650 mg Oral BID    BMET    Component Value Date/Time   NA 135 07/29/2020 0315   K 4.6 07/29/2020 0315   CL 106 07/29/2020 0315   CO2 17 (L) 07/29/2020 0315   GLUCOSE 136 (H) 07/29/2020 0315   BUN 146 (H) 07/29/2020 0315   CREATININE 5.47 (H) 07/29/2020 0315   CALCIUM 11.7 (H) 07/29/2020 0315   GFRNONAA 10 (L) 07/29/2020 0315   GFRAA 12 (L) 07/29/2020 0315   CBC    Component Value Date/Time   WBC 13.2 (H) 07/29/2020 0315   RBC 2.30 (L) 07/29/2020 0315   HGB 7.8 (L) 07/29/2020 0315   HCT 24.0 (L) 07/29/2020 0315   PLT 157 07/29/2020 0315   MCV 104.3 (H) 07/29/2020 0315   MCH 33.9 07/29/2020 0315   MCHC 32.5 07/29/2020 0315   RDW 13.8 07/29/2020 0315   LYMPHSABS 1.2 07/26/2020 1409   MONOABS 0.9 07/26/2020 1409   EOSABS 0.0 07/26/2020 1409  BASOSABS 0.0 07/26/2020 1409     Assessment/Plan: 1. AKI/CKD stage IV- presumably due to ischemic ATN in setting of sepsis from CAP as well as atrial flutter with RVR and hypotension. He is currently without uremic symptoms. Will continue to follow closely. He does have a mature L AVF in place if dialysis needs to be initiated during this hospitalization. 1. Decreased lasix to once daily yesterday due to lack of edema and rising BUN/Cr but may need to stop it completely as his BUN rises and is net negative overnight. 2. No uremic symptoms. 3. Will give 500 cc of IVF and Recheck renal panel in am 4. Will need to follow closely with Dr. Posey Pronto in our office after discharge. 2. Sepsis due to CAP- on abx per primary 3. New onset atrial flutter- cardiology to evaluate and possible anticoagulation and cardioversion.  4. Left hip dislocation- s/p closed reduction of left hip 07/27/20. 5. Anemia of CKD stage IV- will check iron stores and will likely need to start esa 1. TSAT ok at 35% 2. Will start aranesp and  will need to arrange outpatient ESA once discharged. 6. Metabolic acidosis- was on sodium bicarb at home. Resume and follow.  7. SHPTH- will check phos and iPTH. 8. Perineum ulcerations- will need wound care. 9. Disposition- will need SNF placement as he lives alone and are awaiting beds.  Donetta Potts, MD [employer] [phone]

## 2020-07-30 NOTE — Progress Notes (Signed)
PROGRESS NOTE  Mark [name] FUX:[id number] DOB: [birth year] DOA: 07/26/2020 PCP: Delrae Rend, MD   LOS: 2 days   Brief Narrative / Interim history: 59 year old male with chronic kidney disease stage V followed by Dr. Posey Pronto came into the ER after a fall at home with inability to walk.  He was found to have dislocated left hip as well as possible sepsis and pneumonia.  Orthopedics were consulted to reduce the dislocation, and TRH admitted to the hospital.  He was taken to the OR on 9/19 and his dislocation was reduced.  He was also found to be in a flutter with RVR and have acute kidney injury  Subjective / 24h Interval events: No complaints, no chest pain, no palpitations  Assessment & Plan: Principal Problem Close dislocation of the left hip -Following mechanical fall at home, orthopedic surgery consulted and he was taken to the OR on 9/19 status post reduction.  Patient worked with physical therapy postoperatively, reported significant difficulties with activities/ambulation, recommended SNF to which he is agreeable.  TOC consulted. SNF placement pending.  Active Problems Sepsis due to community-acquired pneumonia -Patient met sepsis criteria with elevated WBC, fever, tachycardia, tachypnea and a source -Chest x-ray on admission concerning for right middle lobe pneumonia.  Patient tells me that he has had a cough for the past few days.  He was started on antibiotics with ceftriaxone and doxycycline, continue for now, will narrow to orals on discharge -remains on room air  A flutter, new onset, CHA2DS2-VASc score is 1 for hypertension -Telemetry reviewed, he alternates between sinus tachycardia and a flutter with rates up to 140s overnight, case was discussed with Dr. Harl Bowie with cardiology.  He recommends change beta-blocker to metoprolol and recommending anticoagulation with Eliquis in case he will need to have cardioversion -He had a 2D echo done on 9/19 which showed EF  60-65% -Cardiology will follow as an outpatient -rates stable on 75 metop BID, BP stable. 90-105 at rest which is acceptable  Acute kidney injury chronic kidney disease stage V, POA -most recent outpatient creatinine was in the 3 range, now in the 5 range -renal panel pending this morning, nephrology following, d/w Dr Marval Regal  Essential hypertension -Monitor blood pressure on metoprolol, currently stable  Scheduled Meds: . allopurinol  300 mg Oral Daily  . apixaban  5 mg Oral BID  . darbepoetin (ARANESP) injection - NON-DIALYSIS  60 mcg Subcutaneous Q14 Days  . doxycycline  100 mg Oral Q12H  . metoprolol tartrate  75 mg Oral BID  . sodium bicarbonate  650 mg Oral BID   Continuous Infusions: . sodium chloride 50 mL/hr at 07/30/20 1245  . cefTRIAXone (ROCEPHIN)  IV 1 g (07/30/20 1138)   PRN Meds:.acetaminophen **OR** acetaminophen, alum & mag hydroxide-simeth, HYDROcodone-acetaminophen, metoprolol tartrate  Diet Orders (From admission, onward)    Start     Ordered   07/27/20 1028  Diet Heart Room service appropriate? No; Fluid consistency: Thin  Diet effective now       Question Answer Comment  Room service appropriate? No   Fluid consistency: Thin      07/27/20 1029          DVT prophylaxis: SCDs Start: 07/26/20 2029 apixaban (ELIQUIS) tablet 5 mg     Code Status: Full Code  Family Communication: no family at bedside   Status is: Inpatient  Remains inpatient appropriate because:Inpatient level of care appropriate due to severity of illness  Dispo: The patient is from: Home  Anticipated d/c is to: SNF              Anticipated d/c date is: 2 days              Patient currently is not medically stable to d/c.   Consultants:  Orthopedic surgery  Nephrology  Procedures:  2D echo  Microbiology  none  Antimicrobials: Ceftriaxone  Doxycycline today is day 4, started on 9/18 Ceftriaxone today is day 4, started on 9/18  Objective: Vitals:    07/30/20 0433 07/30/20 0858 07/30/20 0922 07/30/20 1132  BP: 108/88 111/88  121/88  Pulse:  (!) 115 (!) 116 (!) 115  Resp:  18  18  Temp: 97.9 F (36.6 C) 97.9 F (36.6 C)  97.6 F (36.4 C)  TempSrc: Axillary Oral  Oral  SpO2:  100%  100%  Weight:      Height:        Intake/Output Summary (Last 24 hours) at 07/30/2020 1314 Last data filed at 07/30/2020 0859 Gross per 24 hour  Intake 840 ml  Output 1350 ml  Net -510 ml   Filed Weights   07/26/20 1235 07/28/20 0636 07/29/20 0649  Weight: 115.7 kg 113 kg 114.2 kg    Examination:  Constitutional: no distress Eyes: no icterus  ENMT: mmm Neck: normal, supple Respiratory: cta biL, no wheezing, no crackles Cardiovascular: rrr, no mrg Abdomen: soft, nt, nd, BS+ Musculoskeletal: no clubbing / cyanosis.  Skin: no rashes Neurologic: non focal   Data Reviewed: I have independently reviewed following labs and imaging studies   CBC: Recent Labs  Lab 07/26/20 1409 07/28/20 0307 07/29/20 0315  WBC 12.0* 12.0* 13.2*  NEUTROABS 9.8*  --   --   HGB 9.1* 8.3* 7.8*  HCT 28.1* 26.6* 24.0*  MCV 102.6* 104.3* 104.3*  PLT 202 164 546   Basic Metabolic Panel: Recent Labs  Lab 07/26/20 1409 07/28/20 0307 07/29/20 0315  NA 139 137 135  K 4.5 4.5 4.6  CL 105 107 106  CO2 16* 15* 17*  GLUCOSE 109* 136* 136*  BUN 112* 129* 146*  CREATININE 4.93* 5.30* 5.47*  CALCIUM 12.3* 11.7* 11.7*  MG 2.0  --   --    Liver Function Tests: Recent Labs  Lab 07/26/20 1409 07/28/20 0307  AST 45* 37  ALT 15 17  ALKPHOS 73 63  BILITOT 1.0 0.6  PROT 8.7* 7.7  ALBUMIN 3.2* 2.9*   Coagulation Profile: No results for input(s): INR, PROTIME in the last 168 hours. HbA1C: No results for input(s): HGBA1C in the last 72 hours. CBG: No results for input(s): GLUCAP in the last 168 hours.  Recent Results (from the past 240 hour(s))  Urine culture     Status: Abnormal   Collection Time: 07/26/20 12:44 PM   Specimen: Urine, Random  Result  Value Ref Range Status   Specimen Description URINE, RANDOM  Final   Special Requests   Final    NONE Performed at [facility], [address] [place], [place] [address]    Culture 30,000 COLONIES/mL STAPHYLOCOCCUS EPIDERMIDIS (A)  Final   Report Status 07/28/2020 FINAL  Final   Organism ID, Bacteria STAPHYLOCOCCUS EPIDERMIDIS (A)  Final      Susceptibility   Staphylococcus epidermidis - MIC*    CIPROFLOXACIN <=0.5 SENSITIVE Sensitive     GENTAMICIN <=0.5 SENSITIVE Sensitive     NITROFURANTOIN <=16 SENSITIVE Sensitive     OXACILLIN >=4 RESISTANT Resistant     TETRACYCLINE >=16 RESISTANT Resistant  VANCOMYCIN 1 SENSITIVE Sensitive     TRIMETH/SULFA 20 SENSITIVE Sensitive     CLINDAMYCIN >=8 RESISTANT Resistant     RIFAMPIN 1 SENSITIVE Sensitive     Inducible Clindamycin NEGATIVE Sensitive     * 30,000 COLONIES/mL STAPHYLOCOCCUS EPIDERMIDIS  SARS Coronavirus 2 by RT PCR (hospital order, performed in [place] hospital lab) Nasopharyngeal Nasopharyngeal Swab     Status: None   Collection Time: 07/26/20  2:12 PM   Specimen: Nasopharyngeal Swab  Result Value Ref Range Status   SARS Coronavirus 2 NEGATIVE NEGATIVE Final    Comment: (NOTE) SARS-CoV-2 target nucleic acids are NOT DETECTED.  The SARS-CoV-2 RNA is generally detectable in upper and lower respiratory specimens during the acute phase of infection. The lowest concentration of SARS-CoV-2 viral copies this assay can detect is 250 copies / mL. A negative result does not preclude SARS-CoV-2 infection and should not be used as the sole basis for treatment or other patient management decisions.  A negative result may occur with improper specimen collection / handling, submission of specimen other than nasopharyngeal swab, presence of viral mutation(s) within the areas targeted by this assay, and inadequate number of viral copies (<250 copies / mL). A negative result must be combined with clinical observations,  patient history, and epidemiological information.  Fact Sheet for Patients:   StrictlyIdeas.no  Fact Sheet for Healthcare Providers: BankingDealers.co.za  This test is not yet approved or  cleared by the [place] FDA and has been authorized for detection and/or diagnosis of SARS-CoV-2 by FDA under an Emergency Use Authorization (EUA).  This EUA will remain in effect (meaning this test can be used) for the duration of the COVID-19 declaration under Section 564(b)(1) of the Act, 21 U.S.C. section 360bbb-3(b)(1), unless the authorization is terminated or revoked sooner.  Performed at [facility], [place] [address] [place], [place] [address]   Blood culture (routine x 2)     Status: None (Preliminary result)   Collection Time: 07/26/20  2:57 PM   Specimen: BLOOD  Result Value Ref Range Status   Specimen Description BLOOD RIGHT ANTECUBITAL  Final   Special Requests   Final    BOTTLES DRAWN AEROBIC AND ANAEROBIC Blood Culture adequate volume   Culture   Final    NO GROWTH 4 DAYS Performed at [facility], [place] [address] [name], [place] [address]    Report Status PENDING  Incomplete  Blood culture (routine x 2)     Status: None (Preliminary result)   Collection Time: 07/26/20  3:02 PM   Specimen: BLOOD LEFT HAND  Result Value Ref Range Status   Specimen Description BLOOD LEFT HAND  Final   Special Requests   Final    BOTTLES DRAWN AEROBIC AND ANAEROBIC Blood Culture adequate volume   Culture   Final    NO GROWTH 4 DAYS Performed at [facility], [place] [address] [place], [place] [address]    Report Status PENDING  Incomplete  Surgical pcr screen     Status: None   Collection Time: 07/27/20  6:03 AM   Specimen: Nasal Mucosa; Nasal Swab  Result Value Ref Range Status   MRSA, PCR NEGATIVE NEGATIVE Final   Staphylococcus aureus NEGATIVE NEGATIVE Final    Comment: (NOTE) The Xpert SA Assay (FDA approved for  NASAL specimens in patients 67 years of age and older), is one component of a comprehensive surveillance program. It is not intended to diagnose infection nor to guide or  monitor treatment. Performed at [facility], [place] [address], [place], [place] [address]      Radiology Studies: No results found.  Marzetta Board, MD, PhD [facility]  Between 7 am - 7 pm I am available, please contact me via Amion or Securechat  Between 7 pm - 7 am I am not available, please contact night coverage MD/APP via Amion

## 2020-07-31 LAB — CBC
HCT: 27.4 % — ABNORMAL LOW (ref 39.0–52.0)
Hemoglobin: 8.6 g/dL — ABNORMAL LOW (ref 13.0–17.0)
MCH: 33 pg (ref 26.0–34.0)
MCHC: 31.4 g/dL (ref 30.0–36.0)
MCV: 105 fL — ABNORMAL HIGH (ref 80.0–100.0)
Platelets: 167 10*3/uL (ref 150–400)
RBC: 2.61 MIL/uL — ABNORMAL LOW (ref 4.22–5.81)
RDW: 13.8 % (ref 11.5–15.5)
WBC: 13.6 10*3/uL — ABNORMAL HIGH (ref 4.0–10.5)
nRBC: 0 % (ref 0.0–0.2)

## 2020-07-31 LAB — CULTURE, BLOOD (ROUTINE X 2)
Culture: NO GROWTH
Culture: NO GROWTH
Special Requests: ADEQUATE
Special Requests: ADEQUATE

## 2020-07-31 LAB — BASIC METABOLIC PANEL
Anion gap: 17 — ABNORMAL HIGH (ref 5–15)
BUN: 160 mg/dL — ABNORMAL HIGH (ref 8–23)
CO2: 17 mmol/L — ABNORMAL LOW (ref 22–32)
Calcium: 11.1 mg/dL — ABNORMAL HIGH (ref 8.9–10.3)
Chloride: 105 mmol/L (ref 98–111)
Creatinine, Ser: 4.88 mg/dL — ABNORMAL HIGH (ref 0.61–1.24)
GFR calc Af Amer: 14 mL/min — ABNORMAL LOW (ref >60)
GFR calc non Af Amer: 12 mL/min — ABNORMAL LOW (ref >60)
Glucose, Bld: 100 mg/dL — ABNORMAL HIGH (ref 70–99)
Potassium: 5 mmol/L (ref 3.5–5.1)
Sodium: 139 mmol/L (ref 135–145)

## 2020-07-31 LAB — SARS CORONAVIRUS 2 (TAT 6-24 HRS): SARS Coronavirus 2: NEGATIVE

## 2020-07-31 NOTE — Progress Notes (Signed)
Patient ID: Mark [name], male   DOB: [birth year], 59 y.o.   MRN: [id number] S: Feels well this morning but BP soft O:BP 99/68   Pulse (!) 107   Temp 98.5 F (36.9 C)   Resp (!) 21   Ht 5\' 9"  (1.753 m)   Wt 114.3 kg   SpO2 100%   BMI 37.21 kg/m   Intake/Output Summary (Last 24 hours) at 07/31/2020 1020 Last data filed at 07/31/2020 0841 Gross per 24 hour  Intake 1532.5 ml  Output 800 ml  Net 732.5 ml   Intake/Output: I/O last 3 completed shifts: In: 1532.5 [P.O.:1320; I.V.:112.5; IV Piggyback:100] Out: 1850 [Urine:1850]  Intake/Output this shift:  Total I/O In: 480 [P.O.:480] Out: -  Weight change:  Gen: NAD CVS: tachy  Resp: cta Abd: benign Ext: no edema  Recent Labs  Lab 07/26/20 1409 07/28/20 0307 07/29/20 0315 07/30/20 1422  NA 139 137 135 138  K 4.5 4.5 4.6 4.4  CL 105 107 106 107  CO2 16* 15* 17* 18*  GLUCOSE 109* 136* 136* 112*  BUN 112* 129* 146* 150*  CREATININE 4.93* 5.30* 5.47* 5.25*  ALBUMIN 3.2* 2.9*  --   --   CALCIUM 12.3* 11.7* 11.7* 11.2*  AST 45* 37  --   --   ALT 15 17  --   --    Liver Function Tests: Recent Labs  Lab 07/26/20 1409 07/28/20 0307  AST 45* 37  ALT 15 17  ALKPHOS 73 63  BILITOT 1.0 0.6  PROT 8.7* 7.7  ALBUMIN 3.2* 2.9*   No results for input(s): LIPASE, AMYLASE in the last 168 hours. No results for input(s): AMMONIA in the last 168 hours. CBC: Recent Labs  Lab 07/26/20 1409 07/26/20 1409 07/28/20 0307 07/29/20 0315 07/30/20 1422  WBC 12.0*   < > 12.0* 13.2* 13.7*  NEUTROABS 9.8*  --   --   --   --   HGB 9.1*   < > 8.3* 7.8* 8.3*  HCT 28.1*   < > 26.6* 24.0* 26.4*  MCV 102.6*  --  104.3* 104.3* 106.0*  PLT 202   < > 164 157 176   < > = values in this interval not displayed.   Cardiac Enzymes: Recent Labs  Lab 07/26/20 1409 07/27/20 0212  CKTOTAL 958* 690*   CBG: No results for input(s): GLUCAP in the last 168 hours.  Iron Studies:  Recent Labs    07/29/20 0315  IRON 58  TIBC 181*   FERRITIN 767*   Studies/Results: No results found. Marland Kitchen allopurinol  300 mg Oral Daily  . apixaban  5 mg Oral BID  . darbepoetin (ARANESP) injection - NON-DIALYSIS  60 mcg Subcutaneous Q14 Days  . doxycycline  100 mg Oral Q12H  . metoprolol tartrate  75 mg Oral BID  . sodium bicarbonate  650 mg Oral BID    BMET    Component Value Date/Time   NA 138 07/30/2020 1422   K 4.4 07/30/2020 1422   CL 107 07/30/2020 1422   CO2 18 (L) 07/30/2020 1422   GLUCOSE 112 (H) 07/30/2020 1422   BUN 150 (H) 07/30/2020 1422   CREATININE 5.25 (H) 07/30/2020 1422   CALCIUM 11.2 (H) 07/30/2020 1422   GFRNONAA 11 (L) 07/30/2020 1422   GFRAA 13 (L) 07/30/2020 1422   CBC    Component Value Date/Time   WBC 13.7 (H) 07/30/2020 1422   RBC 2.49 (L) 07/30/2020 1422   HGB 8.3 (L) 07/30/2020 1422  HCT 26.4 (L) 07/30/2020 1422   PLT 176 07/30/2020 1422   MCV 106.0 (H) 07/30/2020 1422   MCH 33.3 07/30/2020 1422   MCHC 31.4 07/30/2020 1422   RDW 13.8 07/30/2020 1422   LYMPHSABS 1.2 07/26/2020 1409   MONOABS 0.9 07/26/2020 1409   EOSABS 0.0 07/26/2020 1409   BASOSABS 0.0 07/26/2020 1409     Assessment/Plan: 1. AKI/CKD stage IV- presumably due to ischemic ATN in setting of sepsis from CAP as well as atrial flutter with RVR and hypotension. He is currently without uremic symptoms. Will continue to follow closely. He does have a mature L AVF in place if dialysis needs to be initiated during this hospitalization. 1. Decreased lasix to once daily yesterday due to lack of edema and rising BUN/Cr but may need to stop it completely as his BUN rises and is net negative overnight. 2. No uremic symptoms. 3. Will give 500 cc of IVF and Recheck renal panel in am which unfortunately is still pending.  4. He remains asymptomatic and should be ok for dc to SNF tomorrow but will need to follow closely with Dr. Posey Pronto in our office after discharge. 2. Sepsis due to CAP- on abx per primary 3. New onset atrial  flutter- cardiology to evaluate and possible anticoagulation and cardioversion.  4. Left hip dislocation- s/p closed reduction of left hip 07/27/20. 5. Anemia of CKD stage IV- will check iron stores and will likely need to start esa 1. TSAT ok at 35% 2. Will start aranesp and will need to arrange outpatient ESA once discharged. 6. Metabolic acidosis- was on sodium bicarb at home. Resume and follow.  7. SHPTH- will check phos and iPTH. 8. Perineum ulcerations- will need wound care. 9. Disposition- will need SNF placement as he lives alone and are awaiting beds.  Donetta Potts, MD [employer] [phone]

## 2020-07-31 NOTE — Progress Notes (Signed)
PROGRESS NOTE  Mark [name] DTO:[id number] DOB: October 06, 1961 DOA: 07/26/2020 PCP: Delrae Rend, MD   LOS: 3 days   Brief Narrative / Interim history: 59 year old male with chronic kidney disease stage V followed by Dr. Posey Pronto came into the ER after a fall at home with inability to walk.  He was found to have dislocated left hip as well as possible sepsis and pneumonia.  Orthopedics were consulted to reduce the dislocation, and TRH admitted to the hospital.  He was taken to the OR on 9/19 and his dislocation was reduced.  He was also found to be in a flutter with RVR and have acute kidney injury  Subjective / 24h Interval events: Complaints this morning, no chest pain, no palpitations, no shortness of breath  Assessment & Plan: Principal Problem Close dislocation of the left hip -Following mechanical fall at home, orthopedic surgery consulted and he was taken to the OR on 9/19 status post reduction.  Patient worked with physical therapy postoperatively, reported significant difficulties with activities/ambulation, recommended SNF to which he is agreeable.  TOC consulted. SNF placement pending clearance from nephrology.  Active Problems Sepsis due to community-acquired pneumonia -Patient met sepsis criteria with elevated WBC, fever, tachycardia, tachypnea and a source -Chest x-ray on admission concerning for right middle lobe pneumonia.  Patient tells me that he has had a cough for the past few days.  He was started on antibiotics with ceftriaxone and doxycycline, continue until tomorrow for total of 7 days -remains on room air  A flutter, new onset, CHA2DS2-VASc score is 1 for hypertension -Telemetry reviewed, he alternates between sinus tachycardia and a flutter with rates up to 140s overnight, case was discussed with Dr. Harl Bowie with cardiology.  He recommends change beta-blocker to metoprolol and recommending anticoagulation with Eliquis in case he will need to have cardioversion -He had  a 2D echo done on 9/19 which showed EF 60-65% -Cardiology will follow as an outpatient -rates stable on 75 metop BID, BP stable. 90-105 at rest which is acceptable.  Telemetry reviewed this morning, rates are stable  Acute kidney injury chronic kidney disease stage V, POA -most recent outpatient creatinine was in the 3 range, now in the 5 range -Appreciate nephrology follow-up, creatinine plateaued and started to come down but still in the 5 range.  Today's labs are pending  Essential hypertension -Monitor blood pressure on metoprolol, currently stable  Scheduled Meds: . allopurinol  300 mg Oral Daily  . apixaban  5 mg Oral BID  . darbepoetin (ARANESP) injection - NON-DIALYSIS  60 mcg Subcutaneous Q14 Days  . doxycycline  100 mg Oral Q12H  . metoprolol tartrate  75 mg Oral BID  . sodium bicarbonate  650 mg Oral BID   Continuous Infusions: . sodium chloride 50 mL/hr at 07/30/20 1245  . cefTRIAXone (ROCEPHIN)  IV 1 g (07/31/20 0849)   PRN Meds:.acetaminophen **OR** acetaminophen, alum & mag hydroxide-simeth, HYDROcodone-acetaminophen, metoprolol tartrate  Diet Orders (From admission, onward)    Start     Ordered   07/27/20 1028  Diet Heart Room service appropriate? No; Fluid consistency: Thin  Diet effective now       Question Answer Comment  Room service appropriate? No   Fluid consistency: Thin      07/27/20 1029          DVT prophylaxis: SCDs Start: 07/26/20 2029 apixaban (ELIQUIS) tablet 5 mg     Code Status: Full Code  Family Communication: no family at bedside   Status is:  Inpatient  Remains inpatient appropriate because:Inpatient level of care appropriate due to severity of illness  Dispo: The patient is from: Home              Anticipated d/c is to: SNF              Anticipated d/c date is: 2 days              Patient currently is not medically stable to d/c.   Consultants:  Orthopedic surgery  Nephrology  Procedures:  2D echo  Microbiology   none  Antimicrobials: Ceftriaxone  Doxycycline today is day 6, started on 9/18 Ceftriaxone today is day 6, started on 9/18  Objective: Vitals:   07/31/20 0345 07/31/20 0653 07/31/20 0835 07/31/20 0838  BP: 104/78   99/68  Pulse:  86  (!) 107  Resp: (!) 21 17  (!) 21  Temp: 97.6 F (36.4 C)  98.5 F (36.9 C) 98.5 F (36.9 C)  TempSrc: Oral  Oral   SpO2: 100% 100%    Weight:  114.3 kg    Height:        Intake/Output Summary (Last 24 hours) at 07/31/2020 1003 Last data filed at 07/31/2020 0841 Gross per 24 hour  Intake 1532.5 ml  Output 800 ml  Net 732.5 ml   Filed Weights   07/28/20 0636 07/29/20 0649 07/31/20 0653  Weight: 113 kg 114.2 kg 114.3 kg    Examination:  Constitutional: No distress, sitting in chair Eyes: No scleral icterus ENMT: Moist mucous membranes Neck: normal, supple Respiratory: Lungs are clear bilaterally without wheezing or crackles Cardiovascular: Regular rate and rhythm, no murmurs appreciated Abdomen: Soft, nontender, nondistended, bowel sounds positive Musculoskeletal: no clubbing / cyanosis.  Skin: No rashes seen Neurologic: No focal deficits  Data Reviewed: I have independently reviewed following labs and imaging studies   CBC: Recent Labs  Lab 07/26/20 1409 07/28/20 0307 07/29/20 0315 07/30/20 1422  WBC 12.0* 12.0* 13.2* 13.7*  NEUTROABS 9.8*  --   --   --   HGB 9.1* 8.3* 7.8* 8.3*  HCT 28.1* 26.6* 24.0* 26.4*  MCV 102.6* 104.3* 104.3* 106.0*  PLT 202 164 157 330   Basic Metabolic Panel: Recent Labs  Lab 07/26/20 1409 07/28/20 0307 07/29/20 0315 07/30/20 1422  NA 139 137 135 138  K 4.5 4.5 4.6 4.4  CL 105 107 106 107  CO2 16* 15* 17* 18*  GLUCOSE 109* 136* 136* 112*  BUN 112* 129* 146* 150*  CREATININE 4.93* 5.30* 5.47* 5.25*  CALCIUM 12.3* 11.7* 11.7* 11.2*  MG 2.0  --   --   --    Liver Function Tests: Recent Labs  Lab 07/26/20 1409 07/28/20 0307  AST 45* 37  ALT 15 17  ALKPHOS 73 63  BILITOT 1.0 0.6   PROT 8.7* 7.7  ALBUMIN 3.2* 2.9*   Coagulation Profile: No results for input(s): INR, PROTIME in the last 168 hours. HbA1C: No results for input(s): HGBA1C in the last 72 hours. CBG: No results for input(s): GLUCAP in the last 168 hours.  Recent Results (from the past 240 hour(s))  Urine culture     Status: Abnormal   Collection Time: 07/26/20 12:44 PM   Specimen: Urine, Random  Result Value Ref Range Status   Specimen Description URINE, RANDOM  Final   Special Requests   Final    NONE Performed at [facility], [address] [place], [place] [address]    Culture 30,000 COLONIES/mL STAPHYLOCOCCUS  EPIDERMIDIS (A)  Final   Report Status 07/28/2020 FINAL  Final   Organism ID, Bacteria STAPHYLOCOCCUS EPIDERMIDIS (A)  Final      Susceptibility   Staphylococcus epidermidis - MIC*    CIPROFLOXACIN <=0.5 SENSITIVE Sensitive     GENTAMICIN <=0.5 SENSITIVE Sensitive     NITROFURANTOIN <=16 SENSITIVE Sensitive     OXACILLIN >=4 RESISTANT Resistant     TETRACYCLINE >=16 RESISTANT Resistant     VANCOMYCIN 1 SENSITIVE Sensitive     TRIMETH/SULFA 20 SENSITIVE Sensitive     CLINDAMYCIN >=8 RESISTANT Resistant     RIFAMPIN 1 SENSITIVE Sensitive     Inducible Clindamycin NEGATIVE Sensitive     * 30,000 COLONIES/mL STAPHYLOCOCCUS EPIDERMIDIS  SARS Coronavirus 2 by RT PCR (hospital order, performed in [facility] hospital lab) Nasopharyngeal Nasopharyngeal Swab     Status: None   Collection Time: 07/26/20  2:12 PM   Specimen: Nasopharyngeal Swab  Result Value Ref Range Status   SARS Coronavirus 2 NEGATIVE NEGATIVE Final    Comment: (NOTE) SARS-CoV-2 target nucleic acids are NOT DETECTED.  The SARS-CoV-2 RNA is generally detectable in upper and lower respiratory specimens during the acute phase of infection. The lowest concentration of SARS-CoV-2 viral copies this assay can detect is 250 copies / mL. A negative result does not preclude SARS-CoV-2 infection and should not be used  as the sole basis for treatment or other patient management decisions.  A negative result may occur with improper specimen collection / handling, submission of specimen other than nasopharyngeal swab, presence of viral mutation(s) within the areas targeted by this assay, and inadequate number of viral copies (<250 copies / mL). A negative result must be combined with clinical observations, patient history, and epidemiological information.  Fact Sheet for Patients:   StrictlyIdeas.no  Fact Sheet for Healthcare Providers: BankingDealers.co.za  This test is not yet approved or  cleared by the [place] FDA and has been authorized for detection and/or diagnosis of SARS-CoV-2 by FDA under an Emergency Use Authorization (EUA).  This EUA will remain in effect (meaning this test can be used) for the duration of the COVID-19 declaration under Section 564(b)(1) of the Act, 21 U.S.C. section 360bbb-3(b)(1), unless the authorization is terminated or revoked sooner.  Performed at [facility], [place] [address] [place], [place] [address]   Blood culture (routine x 2)     Status: None (Preliminary result)   Collection Time: 07/26/20  2:57 PM   Specimen: BLOOD  Result Value Ref Range Status   Specimen Description BLOOD RIGHT ANTECUBITAL  Final   Special Requests   Final    BOTTLES DRAWN AEROBIC AND ANAEROBIC Blood Culture adequate volume   Culture   Final    NO GROWTH 4 DAYS Performed at [facility], [place] [address] [place], [place] [address]    Report Status PENDING  Incomplete  Blood culture (routine x 2)     Status: None (Preliminary result)   Collection Time: 07/26/20  3:02 PM   Specimen: BLOOD LEFT HAND  Result Value Ref Range Status   Specimen Description BLOOD LEFT HAND  Final   Special Requests   Final    BOTTLES DRAWN AEROBIC AND ANAEROBIC Blood Culture adequate volume   Culture   Final    NO GROWTH 4 DAYS Performed  at [facility], [place] [address] [place], [place] [address]    Report Status PENDING  Incomplete  Surgical pcr screen     Status:  None   Collection Time: 07/27/20  6:03 AM   Specimen: Nasal Mucosa; Nasal Swab  Result Value Ref Range Status   MRSA, PCR NEGATIVE NEGATIVE Final   Staphylococcus aureus NEGATIVE NEGATIVE Final    Comment: (NOTE) The Xpert SA Assay (FDA approved for NASAL specimens in patients 33 years of age and older), is one component of a comprehensive surveillance program. It is not intended to diagnose infection nor to guide or monitor treatment. Performed at [facility], [place] [address] [place], [place] [address]      Radiology Studies: No results found.  Marzetta Board, MD, PhD [facility]  Between 7 am - 7 pm I am available, please contact me via Amion or Securechat  Between 7 pm - 7 am I am not available, please contact night coverage MD/APP via Amion

## 2020-07-31 NOTE — Progress Notes (Signed)
Physical Therapy Treatment Patient Details Name: Mark [name] MRN: [id number] DOB: 19-Mar-1961 Today's Date: 07/31/2020    History of Present Illness Patient is a 59 y/o male admitted 07/26/20 with left hip dislocation s/p fall; s/p L closed hip reduction 9/19. Additional workup for sepsis and PNA; new onser A-flutter. PMH includes CKD, HTN, osteoporosis, tobacco use, arthritis.    PT Comments    Pt found sideways in bed without knee immobilizer donned; pt agreeable to therapy and repositioning, therapist assisted with repositioning and donning knee immobilizer, NT present for vitals and educated on knee immobilizer; pt requiring increased assist compared to previous sessions with bed mobility; pt continues to demonstrate deficits in strength, endurance and coordination and will benefit from skilled PT to address deficits to maximize independence with functional mobility prior to discharge.     Follow Up Recommendations  SNF;Supervision for mobility/OOB     Equipment Recommendations  None recommended by PT    Recommendations for Other Services       Precautions / Restrictions Precautions Precautions: Fall Required Braces or Orthoses: Knee Immobilizer - Left Knee Immobilizer - Left: On at all times Restrictions Weight Bearing Restrictions: Yes LLE Weight Bearing: Weight bearing as tolerated Other Position/Activity Restrictions: pt found in bed sideways with no knee immobilizer donned; educated NT that therapist documented knee immobilizer at all times    Mobility  Bed Mobility Overal bed mobility: Needs Assistance         Sit to supine: Mod assist      Transfers Overall transfer level: Needs assistance Equipment used: Rolling walker (2 wheeled) Transfers: Sit to/from Stand Sit to Stand: Mod assist;From elevated surface         General transfer comment: pt with increased difficulty, multiple attempts, requiring elevated bed and mod A from  therapist  Ambulation/Gait                 Stairs             Wheelchair Mobility    Modified Rankin (Stroke Patients Only)       Balance                                            Cognition Arousal/Alertness: Awake/alert Behavior During Therapy: WFL for tasks assessed/performed Overall Cognitive Status: Within Functional Limits for tasks assessed                                        Exercises General Exercises - Lower Extremity Hip ABduction/ADduction: AAROM;20 reps;Both;Seated Straight Leg Raises: AAROM;Both;20 reps;Seated    General Comments  HR increased to 113 during session      Pertinent Vitals/Pain Pain Assessment: No/denies pain    Home Living                      Prior Function            PT Goals (current goals can now be found in the care plan section) Acute Rehab PT Goals Patient Stated Goal: to get to the bathroom PT Goal Formulation: With patient Time For Goal Achievement: 08/10/20 Potential to Achieve Goals: Good Progress towards PT goals: Progressing toward goals (pt has been progressing toward goals, during session pt requiring increased assist, will reassess progress next session)  Frequency    Min 3X/week      PT Plan Current plan remains appropriate    Co-evaluation              AM-PAC PT "6 Clicks" Mobility   Outcome Measure  Help needed turning from your back to your side while in a flat bed without using bedrails?: A Little Help needed moving from lying on your back to sitting on the side of a flat bed without using bedrails?: A Lot Help needed moving to and from a bed to a chair (including a wheelchair)?: A Lot Help needed standing up from a chair using your arms (e.g., wheelchair or bedside chair)?: A Little Help needed to walk in hospital room?: A Little Help needed climbing 3-5 steps with a railing? : A Lot 6 Click Score: 15    End of Session  Equipment Utilized During Treatment: Gait belt;Left knee immobilizer Activity Tolerance: Patient tolerated treatment well Patient left: in chair;with call bell/phone within reach;with chair alarm set Nurse Communication: Mobility status PT Visit Diagnosis: Pain;Difficulty in walking, not elsewhere classified (R26.2);Muscle weakness (generalized) (M62.81)     Time: 7858-8502 PT Time Calculation (min) (ACUTE ONLY): 20 min  Charges:  $Therapeutic Activity: 8-22 mins                     [name], DPT Acute Rehabilitation Services [phone]   [name] 07/31/2020, 11:49 AM

## 2020-08-01 LAB — BASIC METABOLIC PANEL
Anion gap: 18 — ABNORMAL HIGH (ref 5–15)
BUN: 156 mg/dL — ABNORMAL HIGH (ref 8–23)
CO2: 14 mmol/L — ABNORMAL LOW (ref 22–32)
Calcium: 11.5 mg/dL — ABNORMAL HIGH (ref 8.9–10.3)
Chloride: 106 mmol/L (ref 98–111)
Creatinine, Ser: 4.53 mg/dL — ABNORMAL HIGH (ref 0.61–1.24)
GFR calc Af Amer: 15 mL/min — ABNORMAL LOW (ref >60)
GFR calc non Af Amer: 13 mL/min — ABNORMAL LOW (ref >60)
Glucose, Bld: 119 mg/dL — ABNORMAL HIGH (ref 70–99)
Potassium: 4.6 mmol/L (ref 3.5–5.1)
Sodium: 138 mmol/L (ref 135–145)

## 2020-08-01 LAB — CBC
HCT: 28.6 % — ABNORMAL LOW (ref 39.0–52.0)
Hemoglobin: 8.9 g/dL — ABNORMAL LOW (ref 13.0–17.0)
MCH: 33.1 pg (ref 26.0–34.0)
MCHC: 31.1 g/dL (ref 30.0–36.0)
MCV: 106.3 fL — ABNORMAL HIGH (ref 80.0–100.0)
Platelets: 173 10*3/uL (ref 150–400)
RBC: 2.69 MIL/uL — ABNORMAL LOW (ref 4.22–5.81)
RDW: 13.9 % (ref 11.5–15.5)
WBC: 14 10*3/uL — ABNORMAL HIGH (ref 4.0–10.5)
nRBC: 0 % (ref 0.0–0.2)

## 2020-08-01 MED ORDER — POLYETHYLENE GLYCOL 3350 17 G PO PACK
17.0000 g | PACK | Freq: Two times a day (BID) | ORAL | Status: DC
  Administered 2020-08-01: 17 g via ORAL
  Filled 2020-08-01: qty 1

## 2020-08-01 MED ORDER — METOPROLOL TARTRATE 100 MG PO TABS
100.0000 mg | ORAL_TABLET | Freq: Two times a day (BID) | ORAL | 0 refills | Status: DC
Start: 2020-08-01 — End: 2020-08-18

## 2020-08-01 MED ORDER — METOPROLOL TARTRATE 100 MG PO TABS
100.0000 mg | ORAL_TABLET | Freq: Two times a day (BID) | ORAL | Status: DC
  Administered 2020-08-01: 100 mg via ORAL
  Filled 2020-08-01: qty 1

## 2020-08-01 MED ORDER — FUROSEMIDE 40 MG PO TABS
40.0000 mg | ORAL_TABLET | Freq: Two times a day (BID) | ORAL | Status: DC
Start: 2020-08-05 — End: 2020-08-18

## 2020-08-01 MED ORDER — APIXABAN 5 MG PO TABS
5.0000 mg | ORAL_TABLET | Freq: Two times a day (BID) | ORAL | 2 refills | Status: DC
Start: 2020-08-01 — End: 2020-08-18

## 2020-08-01 NOTE — Progress Notes (Signed)
[facility] PROGRESS NOTE    Progress Note  Mark [name] Page  MOQ:[id number] DOB: Jul 02, 1961 DOA: 07/26/2020 PCP: Delrae Rend, MD     Brief Narrative:   Mark [name] is an 59 y.o. male past medical history of chronic kidney disease stage V came into the ED for inability to walk was found to have a dislocated left hip with possible sepsis and pneumonia.  Orthopedic surgery was consulted taken to the OR with reduction of dislocation on 07/27/2020 was found to be in a flutter with RVR and acute kidney injury  Assessment/Plan:   Closed dislocation of left hip ([place]) Status post mechanical fall at home reduce dislocation on 07/27/2020. Physical therapy has evaluated the patient recommended skilled nursing facility. SNF pending per nephrology clearance.  Sepsis due to community-acquired pneumonia: With a leukocytosis with an infiltrate on chest x-ray started empirically on Rocephin and doxycycline will complete a 7-day course of antibiotics on 08/01/2020. Now on room air.  A flutter new onset with a chads Vasc score of 1: Case discussed with cardiology he was started on beta-blocker and Eliquis and to be reevaluated as an outpatient for possible cardioversion. 2D echo done on 07/27/2020 showed an EF of 60%. Rate is stable on metoprolol.  Acute kidney injury on chronic kidney disease stage V: Symptoms present.  Lasix was decreased yesterday as there was a rise in BUN and creatinine. He remains asymptomatic. Will need close follow-up with nephrology as an outpatient.  Benign essential HTN Blood pressure stable on metoprolol continue current management..       DVT prophylaxis: eluquis Family Communication:none Status is: Inpatient  Remains inpatient appropriate because:Hemodynamically unstable   Dispo: The patient is from: Home              Anticipated d/c is to: SNF              Anticipated d/c date is: 1 day              Patient currently is medically stable to  d/c.        Code Status:     Code Status Orders  (From admission, onward)         Start     Ordered   07/26/20 2029  Full code  Continuous        07/26/20 2028        Code Status History    Date Active Date Inactive Code Status Order ID Comments User Context   07/26/2020 1859 07/26/2020 2028 Full Code [id number]  Samuella Cota, MD ED   04/29/2020 1916 05/03/2020 1855 Full Code [id number]  Lequita Halt, MD ED   10/06/2011 1955 10/12/2011 1500 Full Code [id number]  Tish Frederickson, RN Inpatient   Advance Care Planning Activity        IV Access:    Peripheral IV   Procedures and diagnostic studies:   No results found.   Medical Consultants:    None.  Anti-Infectives:   Rocephin and azithromycin  Subjective:    Mark [name] relates he feels great respiration has improved pain is controlled.  Objective:    Vitals:   07/31/20 2114 07/31/20 2117 08/01/20 0551 08/01/20 0742  BP:  113/74  112/87  Pulse:  (!) 113  (!) 108  Resp: 20  20 18   Temp:   (!) 97.5 F (36.4 C) 98.2 F (36.8 C)  TempSrc:   Oral Oral  SpO2: 100%   100%  Weight:  112.7 kg   Height:       SpO2: 100 %   Intake/Output Summary (Last 24 hours) at 08/01/2020 0916 Last data filed at 08/01/2020 0600 Gross per 24 hour  Intake 480 ml  Output 1550 ml  Net -1070 ml   Filed Weights   07/29/20 0649 07/31/20 0653 08/01/20 0551  Weight: 114.2 kg 114.3 kg 112.7 kg    Exam: General exam: In no acute distress. Respiratory system: Good air movement and clear to auscultation. Cardiovascular system: S1 & S2 heard, RRR. No JVD. Gastrointestinal system: Abdomen is nondistended, soft and nontender.  Extremities: No pedal edema. Skin: No rashes, lesions or ulcers Psychiatry: Judgement and insight appear normal. Mood & affect appropriate.    Data Reviewed:    Labs: Basic Metabolic Panel: Recent Labs  Lab 07/26/20 1409 07/26/20 1409 07/28/20 0307 07/28/20 0307  07/29/20 0315 07/29/20 0315 07/30/20 1422 07/31/20 1532  NA 139  --  137  --  135  --  138 139  K 4.5   < > 4.5   < > 4.6   < > 4.4 5.0  CL 105  --  107  --  106  --  107 105  CO2 16*  --  15*  --  17*  --  18* 17*  GLUCOSE 109*  --  136*  --  136*  --  112* 100*  BUN 112*  --  129*  --  146*  --  150* 160*  CREATININE 4.93*  --  5.30*  --  5.47*  --  5.25* 4.88*  CALCIUM 12.3*  --  11.7*  --  11.7*  --  11.2* 11.1*  MG 2.0  --   --   --   --   --   --   --    < > = values in this interval not displayed.   GFR Estimated Creatinine Clearance: 19.7 mL/min (A) (by C-G formula based on SCr of 4.88 mg/dL (H)). Liver Function Tests: Recent Labs  Lab 07/26/20 1409 07/28/20 0307  AST 45* 37  ALT 15 17  ALKPHOS 73 63  BILITOT 1.0 0.6  PROT 8.7* 7.7  ALBUMIN 3.2* 2.9*   No results for input(s): LIPASE, AMYLASE in the last 168 hours. No results for input(s): AMMONIA in the last 168 hours. Coagulation profile No results for input(s): INR, PROTIME in the last 168 hours. COVID-19 Labs  No results for input(s): DDIMER, FERRITIN, LDH, CRP in the last 72 hours.  Lab Results  Component Value Date   SARSCOV2NAA NEGATIVE 07/31/2020   [place] NEGATIVE 07/26/2020   [place] NEGATIVE 04/29/2020    CBC: Recent Labs  Lab 07/26/20 1409 07/28/20 0307 07/29/20 0315 07/30/20 1422 07/31/20 1532  WBC 12.0* 12.0* 13.2* 13.7* 13.6*  NEUTROABS 9.8*  --   --   --   --   HGB 9.1* 8.3* 7.8* 8.3* 8.6*  HCT 28.1* 26.6* 24.0* 26.4* 27.4*  MCV 102.6* 104.3* 104.3* 106.0* 105.0*  PLT 202 164 157 176 167   Cardiac Enzymes: Recent Labs  Lab 07/26/20 1409 07/27/20 0212  CKTOTAL 958* 690*   BNP (last 3 results) No results for input(s): PROBNP in the last 8760 hours. CBG: No results for input(s): GLUCAP in the last 168 hours. D-Dimer: No results for input(s): DDIMER in the last 72 hours. Hgb A1c: No results for input(s): HGBA1C in the last 72 hours. Lipid Profile: No results for  input(s): CHOL, HDL, LDLCALC, TRIG, CHOLHDL, LDLDIRECT in the last  72 hours. Thyroid function studies: No results for input(s): TSH, T4TOTAL, T3FREE, THYROIDAB in the last 72 hours.  Invalid input(s): FREET3 Anemia work up: No results for input(s): VITAMINB12, FOLATE, FERRITIN, TIBC, IRON, RETICCTPCT in the last 72 hours. Sepsis Labs: Recent Labs  Lab 07/26/20 1409 07/26/20 1607 07/28/20 0307 07/29/20 0315 07/30/20 1422 07/31/20 1532  WBC   < >  --  12.0* 13.2* 13.7* 13.6*  LATICACIDVEN  --  1.5  --   --   --   --    < > = values in this interval not displayed.   Microbiology Recent Results (from the past 240 hour(s))  Urine culture     Status: Abnormal   Collection Time: 07/26/20 12:44 PM   Specimen: Urine, Random  Result Value Ref Range Status   Specimen Description URINE, RANDOM  Final   Special Requests   Final    NONE Performed at [facility], [address] [place], [place] [address]    Culture 30,000 COLONIES/mL STAPHYLOCOCCUS EPIDERMIDIS (A)  Final   Report Status 07/28/2020 FINAL  Final   Organism ID, Bacteria STAPHYLOCOCCUS EPIDERMIDIS (A)  Final      Susceptibility   Staphylococcus epidermidis - MIC*    CIPROFLOXACIN <=0.5 SENSITIVE Sensitive     GENTAMICIN <=0.5 SENSITIVE Sensitive     NITROFURANTOIN <=16 SENSITIVE Sensitive     OXACILLIN >=4 RESISTANT Resistant     TETRACYCLINE >=16 RESISTANT Resistant     VANCOMYCIN 1 SENSITIVE Sensitive     TRIMETH/SULFA 20 SENSITIVE Sensitive     CLINDAMYCIN >=8 RESISTANT Resistant     RIFAMPIN 1 SENSITIVE Sensitive     Inducible Clindamycin NEGATIVE Sensitive     * 30,000 COLONIES/mL STAPHYLOCOCCUS EPIDERMIDIS  SARS Coronavirus 2 by RT PCR (hospital order, performed in [facility] hospital lab) Nasopharyngeal Nasopharyngeal Swab     Status: None   Collection Time: 07/26/20  2:12 PM   Specimen: Nasopharyngeal Swab  Result Value Ref Range Status   SARS Coronavirus 2 NEGATIVE NEGATIVE Final    Comment:  (NOTE) SARS-CoV-2 target nucleic acids are NOT DETECTED.  The SARS-CoV-2 RNA is generally detectable in upper and lower respiratory specimens during the acute phase of infection. The lowest concentration of SARS-CoV-2 viral copies this assay can detect is 250 copies / mL. A negative result does not preclude SARS-CoV-2 infection and should not be used as the sole basis for treatment or other patient management decisions.  A negative result may occur with improper specimen collection / handling, submission of specimen other than nasopharyngeal swab, presence of viral mutation(s) within the areas targeted by this assay, and inadequate number of viral copies (<250 copies / mL). A negative result must be combined with clinical observations, patient history, and epidemiological information.  Fact Sheet for Patients:   StrictlyIdeas.no  Fact Sheet for Healthcare Providers: BankingDealers.co.za  This test is not yet approved or  cleared by the [place] FDA and has been authorized for detection and/or diagnosis of SARS-CoV-2 by FDA under an Emergency Use Authorization (EUA).  This EUA will remain in effect (meaning this test can be used) for the duration of the COVID-19 declaration under Section 564(b)(1) of the Act, 21 U.S.C. section 360bbb-3(b)(1), unless the authorization is terminated or revoked sooner.  Performed at [facility], [place] [address], [place], [place] [address]   Blood culture (routine x 2)     Status: None   Collection Time: 07/26/20  2:57 PM   Specimen: BLOOD  Result Value Ref Range Status   Specimen Description BLOOD RIGHT ANTECUBITAL  Final   Special Requests   Final    BOTTLES DRAWN AEROBIC AND ANAEROBIC Blood Culture adequate volume   Culture   Final    NO GROWTH 5 DAYS Performed at [facility], [address], [place], [place] [address]    Report Status 07/31/2020 FINAL  Final  Blood culture  (routine x 2)     Status: None   Collection Time: 07/26/20  3:02 PM   Specimen: BLOOD LEFT HAND  Result Value Ref Range Status   Specimen Description BLOOD LEFT HAND  Final   Special Requests   Final    BOTTLES DRAWN AEROBIC AND ANAEROBIC Blood Culture adequate volume   Culture   Final    NO GROWTH 5 DAYS Performed at [facility], [place] [address], [place], [place] [address]    Report Status 07/31/2020 FINAL  Final  Surgical pcr screen     Status: None   Collection Time: 07/27/20  6:03 AM   Specimen: Nasal Mucosa; Nasal Swab  Result Value Ref Range Status   MRSA, PCR NEGATIVE NEGATIVE Final   Staphylococcus aureus NEGATIVE NEGATIVE Final    Comment: (NOTE) The Xpert SA Assay (FDA approved for NASAL specimens in patients 71 years of age and older), is one component of a comprehensive surveillance program. It is not intended to diagnose infection nor to guide or monitor treatment. Performed at [facility], [place] [address], [place], [place] [address]   SARS CORONAVIRUS 2 (TAT 6-24 HRS) Nasopharyngeal Nasopharyngeal Swab     Status: None   Collection Time: 07/31/20  5:55 PM   Specimen: Nasopharyngeal Swab  Result Value Ref Range Status   SARS Coronavirus 2 NEGATIVE NEGATIVE Final    Comment: (NOTE) SARS-CoV-2 target nucleic acids are NOT DETECTED.  The SARS-CoV-2 RNA is generally detectable in upper and lower respiratory specimens during the acute phase of infection. Negative results do not preclude SARS-CoV-2 infection, do not rule out co-infections with other pathogens, and should not be used as the sole basis for treatment or other patient management decisions. Negative results must be combined with clinical observations, patient history, and epidemiological information. The expected result is Negative.  Fact Sheet for Patients: SugarRoll.be  Fact Sheet for Healthcare Providers: https://www.woods-mathews.com/  This  test is not yet approved or cleared by the [place] FDA and  has been authorized for detection and/or diagnosis of SARS-CoV-2 by FDA under an Emergency Use Authorization (EUA). This EUA will remain  in effect (meaning this test can be used) for the duration of the COVID-19 declaration under Se ction 564(b)(1) of the Act, 21 U.S.C. section 360bbb-3(b)(1), unless the authorization is terminated or revoked sooner.  Performed at [facility], [place] [address], [place], [place] [address]      Medications:   . allopurinol  300 mg Oral Daily  . apixaban  5 mg Oral BID  . darbepoetin (ARANESP) injection - NON-DIALYSIS  60 mcg Subcutaneous Q14 Days  . doxycycline  100 mg Oral Q12H  . metoprolol tartrate  75 mg Oral BID  . sodium bicarbonate  650 mg Oral BID   Continuous Infusions: . sodium chloride 10 mL/hr at 07/31/20 1152  . cefTRIAXone (ROCEPHIN)  IV 1 g (08/01/20 0909)      LOS: 4 days   Charlynne Cousins  [facility]  08/01/2020, 9:16 AM

## 2020-08-01 NOTE — TOC Transition Note (Signed)
Transition of Care [facility]) - CM/SW Discharge Note   Patient Details  Name: CUTTER PASSEY MRN: [id number] Date of Birth: Dec 07, 1960  Transition of Care [facility]) CM/SW Contact:  Trula Ore, [place] Phone Number: 08/01/2020, 10:39 AM   Clinical Narrative:     Patient will DC to: [facility]  Anticipated DC date: 08/01/2020  Family notified: [name]  Transport by: Corey Harold  ?  Per MD patient ready for DC to [facility] . RN, patient, patient's family, and facility notified of DC. Discharge Summary sent to facility. RN given number for report [phone] ask for 200 hall nurse RM#[phone]. DC packet on chart. Ambulance transport requested for patient.  CSW signing off.  Final next level of care: Skilled Nursing Facility Barriers to Discharge: No Barriers Identified   Patient Goals and CMS Choice Patient states their goals for this hospitalization and ongoing recovery are:: to go to SNF CMS Medicare.gov Compare Post Acute Care list provided to:: Patient Choice offered to / list presented to : Patient  Discharge Placement              Patient chooses bed at: [facility] Patient to be transferred to facility by: [place] Name of family member notified: [name] [phone] Patient and family notified of of transfer: 08/01/20  Discharge Plan and Services                                     Social Determinants of Health (SDOH) Interventions     Readmission Risk Interventions No flowsheet data found.

## 2020-08-01 NOTE — Progress Notes (Signed)
Patient ID: Mark [name], male   DOB: [birth year], 59 y.o.   MRN: [id number] S: Feels well, no complaints.  For discharge to SNF today O:BP 119/85   Pulse (!) 108   Temp 98.2 F (36.8 C) (Oral)   Resp 18   Ht 5\' 9"  (1.753 m)   Wt 112.7 kg   SpO2 100%   BMI 36.69 kg/m   Intake/Output Summary (Last 24 hours) at 08/01/2020 1201 Last data filed at 08/01/2020 0600 Gross per 24 hour  Intake 480 ml  Output 1550 ml  Net -1070 ml   Intake/Output: I/O last 3 completed shifts: In: 1320 [P.O.:1320] Out: 2050 [Urine:2050]  Intake/Output this shift:  No intake/output data recorded. Weight change: -1.6 kg Gen: NAD CVS: tachy at 108, no rub Resp: cta Abd: benign Ext: no edema  Recent Labs  Lab 07/26/20 1409 07/28/20 0307 07/29/20 0315 07/30/20 1422 07/31/20 1532 08/01/20 0836  NA 139 137 135 138 139 138  K 4.5 4.5 4.6 4.4 5.0 4.6  CL 105 107 106 107 105 106  CO2 16* 15* 17* 18* 17* 14*  GLUCOSE 109* 136* 136* 112* 100* 119*  BUN 112* 129* 146* 150* 160* 156*  CREATININE 4.93* 5.30* 5.47* 5.25* 4.88* 4.53*  ALBUMIN 3.2* 2.9*  --   --   --   --   CALCIUM 12.3* 11.7* 11.7* 11.2* 11.1* 11.5*  AST 45* 37  --   --   --   --   ALT 15 17  --   --   --   --    Liver Function Tests: Recent Labs  Lab 07/26/20 1409 07/28/20 0307  AST 45* 37  ALT 15 17  ALKPHOS 73 63  BILITOT 1.0 0.6  PROT 8.7* 7.7  ALBUMIN 3.2* 2.9*   No results for input(s): LIPASE, AMYLASE in the last 168 hours. No results for input(s): AMMONIA in the last 168 hours. CBC: Recent Labs  Lab 07/26/20 1409 07/26/20 1409 07/28/20 0307 07/28/20 0307 07/29/20 0315 07/29/20 0315 07/30/20 1422 07/31/20 1532 08/01/20 0836  WBC 12.0*   < > 12.0*   < > 13.2*   < > 13.7* 13.6* 14.0*  NEUTROABS 9.8*  --   --   --   --   --   --   --   --   HGB 9.1*   < > 8.3*   < > 7.8*   < > 8.3* 8.6* 8.9*  HCT 28.1*   < > 26.6*   < > 24.0*   < > 26.4* 27.4* 28.6*  MCV 102.6*   < > 104.3*  --  104.3*  --  106.0* 105.0*  106.3*  PLT 202   < > 164   < > 157   < > 176 167 173   < > = values in this interval not displayed.   Cardiac Enzymes: Recent Labs  Lab 07/26/20 1409 07/27/20 0212  CKTOTAL 958* 690*   CBG: No results for input(s): GLUCAP in the last 168 hours.  Iron Studies: No results for input(s): IRON, TIBC, TRANSFERRIN, FERRITIN in the last 72 hours. Studies/Results: No results found. [place] Kitchen allopurinol  300 mg Oral Daily  . apixaban  5 mg Oral BID  . darbepoetin (ARANESP) injection - NON-DIALYSIS  60 mcg Subcutaneous Q14 Days  . doxycycline  100 mg Oral Q12H  . metoprolol tartrate  100 mg Oral BID  . polyethylene glycol  17 g Oral BID  . sodium bicarbonate  650 mg  Oral BID    BMET    Component Value Date/Time   NA 138 08/01/2020 0836   K 4.6 08/01/2020 0836   CL 106 08/01/2020 0836   CO2 14 (L) 08/01/2020 0836   GLUCOSE 119 (H) 08/01/2020 0836   BUN 156 (H) 08/01/2020 0836   CREATININE 4.53 (H) 08/01/2020 0836   CALCIUM 11.5 (H) 08/01/2020 0836   GFRNONAA 13 (L) 08/01/2020 0836   GFRAA 15 (L) 08/01/2020 0836   CBC    Component Value Date/Time   WBC 14.0 (H) 08/01/2020 0836   RBC 2.69 (L) 08/01/2020 0836   HGB 8.9 (L) 08/01/2020 0836   HCT 28.6 (L) 08/01/2020 0836   PLT 173 08/01/2020 0836   MCV 106.3 (H) 08/01/2020 0836   MCH 33.1 08/01/2020 0836   MCHC 31.1 08/01/2020 0836   RDW 13.9 08/01/2020 0836   LYMPHSABS 1.2 07/26/2020 1409   MONOABS 0.9 07/26/2020 1409   EOSABS 0.0 07/26/2020 1409   BASOSABS 0.0 07/26/2020 1409    Assessment/Plan: 1. AKI/CKD stage IV- presumably due to ischemic ATN in setting of sepsis from CAP as well as atrial flutter with RVR and hypotension. He is currently without uremic symptoms. Will continue to follow closely. He does have a mature L AVF in place if dialysis needs to be initiated during this hospitalization. 1. Lasix was held due to hypovolemia but may need to resume later at [facility] after discharge. 2. Will arrange for follow up labs  next week and to be seen in our office in 2-3 weeks 3. Stable for discharge to SNF today  4. He remains asymptomatic 2. Sepsis due to CAP- on abx per primary 3. New onset atrial flutter- cardiology to evaluate and possible anticoagulation and cardioversion.  4. Left hip dislocation- s/p closed reduction of left hip 07/27/20. 5. Anemia of CKD stage IV- will check iron stores and will likely need to start esa 1. TSAT ok at 35% 2. Will start aranesp and will need to arrange outpatient ESA once discharged. 6. Metabolic acidosis- was on sodium bicarb at home. Resume and follow.  7. SHPTH- will check phos and iPTH. 8. Perineum ulcerations- will need wound care. 9. Disposition- discharge to SNF today  Donetta Potts, MD [facility] [phone]

## 2020-08-01 NOTE — Progress Notes (Signed)
[facility] SNF and report given to Haze Rushing, LPN. Questions answered and no concerns verbalized.  Awaiting [facility] to transfer patient out. Elita Boone. BSN. RN

## 2020-08-01 NOTE — Discharge Summary (Signed)
Physician Discharge Summary  Mark [name] UXL:[id number] DOB: [birth year] DOA: 07/26/2020  PCP: Delrae Rend, MD  Admit date: 07/26/2020 Discharge date: 08/01/2020  Admitted From: Home Disposition:  SNF  Recommendations for Outpatient Follow-up:  1. Follow up with PCP in 1-2 weeks 2. Please obtain BMP/CBC in one week   Home Health:No Equipment/Devices:None  Discharge Condition:Stable CODE STATUS:Full Diet recommendation: Heart Healthy  Brief/Interim Summary: 59 y.o. male past medical history of chronic kidney disease stage V came into the ED for inability to walk was found to have a dislocated left hip with possible sepsis and pneumonia.  Orthopedic surgery was consulted taken to the OR with reduction of dislocation on 07/27/2020 was found to be in a flutter with RVR and acute kidney injury  Discharge Diagnoses:  Principal Problem:   Closed dislocation of left hip ([name]) Active Problems:   Benign essential HTN   Sepsis ([place])   CKD (chronic kidney disease), stage IV ([facility])   Gout   Pneumonia   Atrial flutter with rapid ventricular response ([place])  Close dislocation of the left hip: Status post reduction on 07/27/2020 likely due to mechanical fall. Physical therapy evaluated patient recommended skilled nursing facility.  Sepsis due to community-acquired pneumonia: He was started empirically on Rocephin and doxycycline and he completed his course in house now on room air.  New onset a flutter: Case discussed with cardiology was started on beta-blocker and Eliquis needs to be reevaluated as an outpatient for possible cardioversion has had rate has been ranging from 100-1 10. A 2D echo was done which showed an EF of 60%.  Acute kidney injury on chronic kidney disease stage V: Asymptomatic his Lasix was decreased yesterday due to a rise in his creatinine. Will need close follow-up appointment with nephrology as an outpatient.  Benign essential hypertension: Continue  metoprolol blood pressure currently stable. Going home off on a lower dose of Lasix.  Discharge Instructions  Discharge Instructions    Diet - low sodium heart healthy   Complete by: As directed    Increase activity slowly   Complete by: As directed    No wound care   Complete by: As directed      Allergies as of 08/01/2020      Reactions   Penicillins Swelling, Rash, Other (See Comments)   Arms and eyes swell & skin breaks out  Has patient had a PCN reaction causing immediate rash, facial/tongue/throat swelling, SOB or lightheadedness with hypotension: Yes Has patient had a PCN reaction causing severe rash involving mucus membranes or skin necrosis: Yes Has patient had a PCN reaction that required hospitalization: No Has patient had a PCN reaction occurring within the last 10 years: No If all of the above answers are "NO", then may proceed with Cephalosporin use.      Medication List    STOP taking these medications   carvedilol 25 MG tablet Commonly known as: COREG     TAKE these medications   acetaminophen 325 MG tablet Commonly known as: TYLENOL Take 325 mg by mouth every 6 (six) hours as needed for mild pain or headache.   allopurinol 300 MG tablet Commonly known as: ZYLOPRIM Take 300 mg by mouth daily after breakfast.   apixaban 5 MG Tabs tablet Commonly known as: ELIQUIS Take 1 tablet (5 mg total) by mouth 2 (two) times daily.   ferrous sulfate 325 (65 FE) MG tablet Take 1 tablet (325 mg total) by mouth daily with breakfast. What changed: when to take this  furosemide 40 MG tablet Commonly known as: LASIX Take 1 tablet (40 mg total) by mouth 2 (two) times daily. Start taking on: August 05, 2020 What changed: These instructions start on August 05, 2020. If you are unsure what to do until then, ask your doctor or other care provider.   metoprolol tartrate 100 MG tablet Commonly known as: LOPRESSOR Take 1 tablet (100 mg total) by mouth 2 (two) times  daily.   multivitamin with minerals Tabs tablet Take 1 tablet by mouth daily after breakfast.   sodium bicarbonate 650 MG tablet Take 650 mg by mouth 3 (three) times daily.   VITAMIN D3 PO Take 1 tablet by mouth daily after breakfast.       Contact information for follow-up providers    [name], MD. Schedule an appointment as soon as possible for a visit in 1 week.   Specialty: Orthopedic Surgery Contact information: [place] [address] [phone]            Contact information for after-discharge care    Destination    [facility] Preferred SNF .   Service: Skilled Nursing Contact information: [address] [address] [phone]                 Allergies  Allergen Reactions  . Penicillins Swelling, Rash and Other (See Comments)    Arms and eyes swell & skin breaks out  Has patient had a PCN reaction causing immediate rash, facial/tongue/throat swelling, SOB or lightheadedness with hypotension: Yes Has patient had a PCN reaction causing severe rash involving mucus membranes or skin necrosis: Yes Has patient had a PCN reaction that required hospitalization: No Has patient had a PCN reaction occurring within the last 10 years: No If all of the above answers are "NO", then may proceed with Cephalosporin use.     Consultations:  Nephrology   Procedures/Studies: DG Chest 2 View  Result Date: 07/27/2020 CLINICAL DATA:  Short of breath. EXAM: CHEST - 2 VIEW COMPARISON:  07/26/2020 FINDINGS: Stable cardiac enlargement. Persistent nodular opacity within the lateral right lung base, along the right border, is noted which may reflect atelectasis or airspace disease. Mild platelike atelectasis in the left midlung is new from previous exam. No findings of pleural effusion or interstitial edema. IMPRESSION: Persistent nodular opacity within the lateral right lung base which may reflect atelectasis  or airspace disease. Continued follow-up imaging is advised to ensure resolution. If this does not improve thin more definitive characterization with CT of the chest would be recommended to assess for underlying lung nodule. Electronically Signed   By: Kerby Moors M.D.   On: 07/27/2020 08:57   DG Lumbar Spine Complete  Result Date: 07/26/2020 CLINICAL DATA:  Pain after fall.  Hip deformity. EXAM: LUMBAR SPINE - COMPLETE 4+ VIEW COMPARISON:  None. FINDINGS: Patient is status post left hip replacement. On limited images, there is a dislocation of the femoral component versus the acetabular component. Pelvic bones otherwise normal. Multilevel degenerative disc disease. No fracture or malalignment. Lower lumbar facet degenerative changes noted. IMPRESSION: 1. Left hip dislocation.  See the dedicated left hip films. 2. Degenerative disc disease and lower lumbar facet degenerative changes in the lumbar spine. 3. No fracture or traumatic malalignment. Electronically Signed   By: Dorise Bullion III M.D   On: 07/26/2020 14:02   CT Head Wo Contrast  Result Date: 07/26/2020 CLINICAL DATA:  Status post fall. EXAM: CT HEAD WITHOUT CONTRAST TECHNIQUE:  Contiguous axial images were obtained from the base of the skull through the vertex without intravenous contrast. COMPARISON:  None. FINDINGS: Brain: There is mild cerebral atrophy with widening of the extra-axial spaces and ventricular dilatation. There are areas of decreased attenuation within the white matter tracts of the supratentorial brain, consistent with microvascular disease changes. Vascular: No hyperdense vessel or unexpected calcification. Skull: Normal. Negative for fracture or focal lesion. Sinuses/Orbits: No acute finding. Other: Mild posterolateral scalp soft tissue swelling is seen along the vertex on the right. IMPRESSION: 1. Mild cerebral atrophy. 2. No acute intracranial abnormality. Electronically Signed   By: Virgina Norfolk M.D.   On: 07/26/2020  15:23   CT Cervical Spine Wo Contrast  Result Date: 07/26/2020 CLINICAL DATA:  Status post fall. EXAM: CT CERVICAL SPINE WITHOUT CONTRAST TECHNIQUE: Multidetector CT imaging of the cervical spine was performed without intravenous contrast. Multiplanar CT image reconstructions were also generated. COMPARISON:  None. FINDINGS: Alignment: Normal. Skull base and vertebrae: No acute fracture. No primary bone lesion or focal pathologic process. Chronic changes are seen along the anterior arch of the C1 vertebral body. Soft tissues and spinal canal: No prevertebral fluid or swelling. No visible canal hematoma. Disc levels: Moderate severity endplate sclerosis is seen at the level of C6-C7. There is moderate severity narrowing of the anterior atlantoaxial articulation. Marked severity intervertebral disc space narrowing is seen at this level. Mild intervertebral disc space narrowing is noted at the level of C7-T1. Mild, bilateral multilevel facet joint hypertrophy is noted. Upper chest: Negative. Other: None. IMPRESSION: 1. No acute osseous abnormality. 2. Marked severity degenerative disc disease at the level of C6-C7. Electronically Signed   By: Virgina Norfolk M.D.   On: 07/26/2020 15:29   DG Chest Portable 1 View  Result Date: 07/26/2020 CLINICAL DATA:  Low-grade fever and fall. EXAM: PORTABLE CHEST 1 VIEW COMPARISON:  10/12/2011 FINDINGS: Lordotic technique is demonstrated. Patient is rotated to the right. Lungs are adequately inflated with opacification over the right midlung which may be due to atelectasis or infection. No effusion. Stable cardiomegaly. Remainder of the exam is unchanged. IMPRESSION: 1. Opacification over the right midlung which may be due to atelectasis or infection. 2. Stable cardiomegaly. Electronically Signed   By: Marin Olp M.D.   On: 07/26/2020 14:30   DG C-Arm 1-60 Min  Result Date: 07/27/2020 CLINICAL DATA:  Post left hip reduction. EXAM: OPERATIVE LEFT HIP (WITH PELVIS IF  PERFORMED) 2 VIEWS TECHNIQUE: Fluoroscopic spot image(s) were submitted for interpretation post-operatively. COMPARISON:  Left hip radiographs-07/26/2020 FINDINGS: Two spot fluoroscopic images of the left hip are provided for review and demonstrate apparent reduction of previously identified dislocated left total hip prosthesis. IMPRESSION: Post reduction of left total hip prosthesis without apparent complication. Electronically Signed   By: Sandi Mariscal M.D.   On: 07/27/2020 09:29   ECHOCARDIOGRAM COMPLETE  Result Date: 07/27/2020    ECHOCARDIOGRAM REPORT   Patient Name:   Mark [name] Date of Exam: 07/27/2020 Medical Rec #:  [id number]        Height:       69.0 in Accession #:    [id number]       Weight:       255.0 lb Date of Birth:  Sep 12, 1961        BSA:          2.291 m Patient Age:    73 years         BP:  157/81 mmHg Patient Gender: M                HR:           114 bpm. Exam Location:  Inpatient Procedure: 2D Echo, Cardiac Doppler, Color Doppler and Intracardiac            Opacification Agent Indications:    I50.33 Acute on chronic diastolic (congestive) heart failure  History:        Patient has no prior history of Echocardiogram examinations.                 Signs/Symptoms:Dyspnea; Risk Factors:Hypertension. CKD.  Sonographer:    Jonelle Sidle Dance Referring Phys: [id number] Caren Griffins  Sonographer Comments: Suboptimal parasternal window and patient is morbidly obese. IMPRESSIONS  1. Left ventricular ejection fraction, by estimation, is 60 to 65%. The left ventricle has normal function. Left ventricular endocardial border not optimally defined to evaluate regional wall motion. Left ventricular diastolic parameters are indeterminate.  2. Right ventricular systolic function was not well visualized. The right ventricular size is not well visualized.  3. Left atrial size was moderately dilated.  4. The mitral valve was not well visualized. No evidence of mitral valve regurgitation. No evidence of  mitral stenosis.  5. The aortic valve was not well visualized. Aortic valve regurgitation is not visualized. No aortic stenosis is present.  6. The inferior vena cava is normal in size with greater than 50% respiratory variability, suggesting right atrial pressure of 3 mmHg. FINDINGS  Left Ventricle: Left ventricular ejection fraction, by estimation, is 60 to 65%. The left ventricle has normal function. Left ventricular endocardial border not optimally defined to evaluate regional wall motion. The left ventricular internal cavity size was normal in size. There is no left ventricular hypertrophy. Left ventricular diastolic parameters are indeterminate. Right Ventricle: Grossly RV appears normal in size and function. The right ventricular size is not well visualized. Right vetricular wall thickness was not well visualized. Right ventricular systolic function was not well visualized. Left Atrium: Left atrial size was moderately dilated. Right Atrium: Right atrial size was not well visualized. Pericardium: There is no evidence of pericardial effusion. Mitral Valve: The mitral valve was not well visualized. No evidence of mitral valve regurgitation. No evidence of mitral valve stenosis. Tricuspid Valve: The tricuspid valve is not well visualized. Tricuspid valve regurgitation is not demonstrated. No evidence of tricuspid stenosis. Aortic Valve: The aortic valve was not well visualized. Aortic valve regurgitation is not visualized. No aortic stenosis is present. Aortic valve mean gradient measures 4.2 mmHg. Aortic valve peak gradient measures 8.1 mmHg. Aortic valve area, by VTI measures 2.58 cm. Pulmonic Valve: The pulmonic valve was not well visualized. Pulmonic valve regurgitation is not visualized. No evidence of pulmonic stenosis. Aorta: The aortic root was not well visualized. Pulmonary Artery: Indeterminant PASP, inadequate TR jet. Venous: The inferior vena cava is normal in size with greater than 50% respiratory  variability, suggesting right atrial pressure of 3 mmHg. IAS/Shunts: No atrial level shunt detected by color flow Doppler.  LEFT VENTRICLE PLAX 2D LVIDd:         4.20 cm LVIDs:         3.60 cm LV PW:         1.10 cm LV IVS:        0.90 cm LVOT diam:     2.00 cm LV SV:         58 LV SV Index:   25 LVOT Area:  3.14 cm  RIGHT VENTRICLE          IVC RV Basal diam:  2.40 cm  IVC diam: 2.20 cm TAPSE (M-mode): 2.1 cm LEFT ATRIUM              Index       RIGHT ATRIUM           Index LA diam:        4.60 cm  2.01 cm/m  RA Area:     24.60 cm LA Vol (A2C):   109.0 ml 47.58 ml/m RA Volume:   68.40 ml  29.86 ml/m LA Vol (A4C):   82.4 ml  35.97 ml/m LA Biplane Vol: 100.0 ml 43.65 ml/m  AORTIC VALVE AV Area (Vmax):    2.47 cm AV Area (Vmean):   2.26 cm AV Area (VTI):     2.58 cm AV Vmax:           142.37 cm/s AV Vmean:          98.965 cm/s AV VTI:            0.223 m AV Peak Grad:      8.1 mmHg AV Mean Grad:      4.2 mmHg LVOT Vmax:         112.09 cm/s LVOT Vmean:        71.096 cm/s LVOT VTI:          0.183 m LVOT/AV VTI ratio: 0.82  AORTA Ao Asc diam: 3.30 cm MITRAL VALVE MV Area (PHT): 4.07 cm     SHUNTS MV Decel Time: 187 msec     Systemic VTI:  0.18 m MV E velocity: 106.50 cm/s  Systemic Diam: 2.00 cm Carlyle Dolly MD Electronically signed by Carlyle Dolly MD Signature Date/Time: 07/27/2020/4:03:17 PM    Final    DG HIP OPERATIVE UNILAT W OR W/O PELVIS LEFT  Result Date: 07/27/2020 CLINICAL DATA:  Post left hip reduction. EXAM: OPERATIVE LEFT HIP (WITH PELVIS IF PERFORMED) 2 VIEWS TECHNIQUE: Fluoroscopic spot image(s) were submitted for interpretation post-operatively. COMPARISON:  Left hip radiographs-07/26/2020 FINDINGS: Two spot fluoroscopic images of the left hip are provided for review and demonstrate apparent reduction of previously identified dislocated left total hip prosthesis. IMPRESSION: Post reduction of left total hip prosthesis without apparent complication. Electronically Signed   By: Sandi Mariscal M.D.   On: 07/27/2020 09:29   DG Hip Unilat W or Wo Pelvis 2-3 Views Left  Result Date: 07/26/2020 CLINICAL DATA:  59 year old male with a history of fall EXAM: DG HIP (WITH OR WITHOUT PELVIS) 2-3V LEFT COMPARISON:  CT 04/29/2020 FINDINGS: Posterior dislocation of the left hip arthroplasty. No fracture identified in the proximal femur. No fracture identified at the pelvis. Osteopenia. IMPRESSION: Posterior dislocation of the left hip arthroplasty components Electronically Signed   By: Corrie Mckusick D.O.   On: 07/26/2020 14:03    Subjective: No new complaints feels great.  Discharge Exam: Vitals:   08/01/20 0551 08/01/20 0742  BP:  112/87  Pulse:  (!) 108  Resp: 20 18  Temp: (!) 97.5 F (36.4 C) 98.2 F (36.8 C)  SpO2:  100%   Vitals:   07/31/20 2114 07/31/20 2117 08/01/20 0551 08/01/20 0742  BP:  113/74  112/87  Pulse:  (!) 113  (!) 108  Resp: 20  20 18   Temp:   (!) 97.5 F (36.4 C) 98.2 F (36.8 C)  TempSrc:   Oral Oral  SpO2: 100%   100%  Weight:   112.7 kg   Height:        General: Pt is alert, awake, not in acute distress Cardiovascular: RRR, S1/S2 +, no rubs, no gallops Respiratory: CTA bilaterally, no wheezing, no rhonchi Abdominal: Soft, NT, ND, bowel sounds + Extremities: no edema, no cyanosis    The results of significant diagnostics from this hospitalization (including imaging, microbiology, ancillary and laboratory) are listed below for reference.     Microbiology: Recent Results (from the past 240 hour(s))  Urine culture     Status: Abnormal   Collection Time: 07/26/20 12:44 PM   Specimen: Urine, Random  Result Value Ref Range Status   Specimen Description URINE, RANDOM  Final   Special Requests   Final    NONE Performed at [facility], [address], [place], [place] [address]    Culture 30,000 COLONIES/mL STAPHYLOCOCCUS EPIDERMIDIS (A)  Final   Report Status 07/28/2020 FINAL  Final   Organism ID, Bacteria STAPHYLOCOCCUS  EPIDERMIDIS (A)  Final      Susceptibility   Staphylococcus epidermidis - MIC*    CIPROFLOXACIN <=0.5 SENSITIVE Sensitive     GENTAMICIN <=0.5 SENSITIVE Sensitive     NITROFURANTOIN <=16 SENSITIVE Sensitive     OXACILLIN >=4 RESISTANT Resistant     TETRACYCLINE >=16 RESISTANT Resistant     VANCOMYCIN 1 SENSITIVE Sensitive     TRIMETH/SULFA 20 SENSITIVE Sensitive     CLINDAMYCIN >=8 RESISTANT Resistant     RIFAMPIN 1 SENSITIVE Sensitive     Inducible Clindamycin NEGATIVE Sensitive     * 30,000 COLONIES/mL STAPHYLOCOCCUS EPIDERMIDIS  SARS Coronavirus 2 by RT PCR (hospital order, performed in [facility] hospital lab) Nasopharyngeal Nasopharyngeal Swab     Status: None   Collection Time: 07/26/20  2:12 PM   Specimen: Nasopharyngeal Swab  Result Value Ref Range Status   SARS Coronavirus 2 NEGATIVE NEGATIVE Final    Comment: (NOTE) SARS-CoV-2 target nucleic acids are NOT DETECTED.  The SARS-CoV-2 RNA is generally detectable in upper and lower respiratory specimens during the acute phase of infection. The lowest concentration of SARS-CoV-2 viral copies this assay can detect is 250 copies / mL. A negative result does not preclude SARS-CoV-2 infection and should not be used as the sole basis for treatment or other patient management decisions.  A negative result may occur with improper specimen collection / handling, submission of specimen other than nasopharyngeal swab, presence of viral mutation(s) within the areas targeted by this assay, and inadequate number of viral copies (<250 copies / mL). A negative result must be combined with clinical observations, patient history, and epidemiological information.  Fact Sheet for Patients:   StrictlyIdeas.no  Fact Sheet for Healthcare Providers: BankingDealers.co.za  This test is not yet approved or  cleared by the [place] FDA and has been authorized for detection and/or diagnosis of  SARS-CoV-2 by FDA under an Emergency Use Authorization (EUA).  This EUA will remain in effect (meaning this test can be used) for the duration of the COVID-19 declaration under Section 564(b)(1) of the Act, 21 U.S.C. section 360bbb-3(b)(1), unless the authorization is terminated or revoked sooner.  Performed at [facility], [place] [address], [place], [place] [address]   Blood culture (routine x 2)     Status: None   Collection Time: 07/26/20  2:57 PM   Specimen: BLOOD  Result Value Ref Range Status   Specimen Description BLOOD RIGHT ANTECUBITAL  Final   Special Requests   Final  BOTTLES DRAWN AEROBIC AND ANAEROBIC Blood Culture adequate volume   Culture   Final    NO GROWTH 5 DAYS Performed at [facility], [place] [address], [place], [place] [address]    Report Status 07/31/2020 FINAL  Final  Blood culture (routine x 2)     Status: None   Collection Time: 07/26/20  3:02 PM   Specimen: BLOOD LEFT HAND  Result Value Ref Range Status   Specimen Description BLOOD LEFT HAND  Final   Special Requests   Final    BOTTLES DRAWN AEROBIC AND ANAEROBIC Blood Culture adequate volume   Culture   Final    NO GROWTH 5 DAYS Performed at [facility], [place] [address] [place], [place] [address]    Report Status 07/31/2020 FINAL  Final  Surgical pcr screen     Status: None   Collection Time: 07/27/20  6:03 AM   Specimen: Nasal Mucosa; Nasal Swab  Result Value Ref Range Status   MRSA, PCR NEGATIVE NEGATIVE Final   Staphylococcus aureus NEGATIVE NEGATIVE Final    Comment: (NOTE) The Xpert SA Assay (FDA approved for NASAL specimens in patients 16 years of age and older), is one component of a comprehensive surveillance program. It is not intended to diagnose infection nor to guide or monitor treatment. Performed at [facility], [place] [address] [place], [place] [address]   SARS CORONAVIRUS 2 (TAT 6-24 HRS) Nasopharyngeal Nasopharyngeal Swab     Status: None    Collection Time: 07/31/20  5:55 PM   Specimen: Nasopharyngeal Swab  Result Value Ref Range Status   SARS Coronavirus 2 NEGATIVE NEGATIVE Final    Comment: (NOTE) SARS-CoV-2 target nucleic acids are NOT DETECTED.  The SARS-CoV-2 RNA is generally detectable in upper and lower respiratory specimens during the acute phase of infection. Negative results do not preclude SARS-CoV-2 infection, do not rule out co-infections with other pathogens, and should not be used as the sole basis for treatment or other patient management decisions. Negative results must be combined with clinical observations, patient history, and epidemiological information. The expected result is Negative.  Fact Sheet for Patients: SugarRoll.be  Fact Sheet for Healthcare Providers: https://www.woods-mathews.com/  This test is not yet approved or cleared by the [place] FDA and  has been authorized for detection and/or diagnosis of SARS-CoV-2 by FDA under an Emergency Use Authorization (EUA). This EUA will remain  in effect (meaning this test can be used) for the duration of the COVID-19 declaration under Se ction 564(b)(1) of the Act, 21 U.S.C. section 360bbb-3(b)(1), unless the authorization is terminated or revoked sooner.  Performed at [facility], [place] [address], [place], [place] [address]      Labs: BNP (last 3 results) No results for input(s): BNP in the last 8760 hours. Basic Metabolic Panel: Recent Labs  Lab 07/26/20 1409 07/28/20 0307 07/29/20 0315 07/30/20 1422 07/31/20 1532  NA 139 137 135 138 139  K 4.5 4.5 4.6 4.4 5.0  CL 105 107 106 107 105  CO2 16* 15* 17* 18* 17*  GLUCOSE 109* 136* 136* 112* 100*  BUN 112* 129* 146* 150* 160*  CREATININE 4.93* 5.30* 5.47* 5.25* 4.88*  CALCIUM 12.3* 11.7* 11.7* 11.2* 11.1*  MG 2.0  --   --   --   --    Liver Function Tests: Recent Labs  Lab 07/26/20 1409 07/28/20 0307  AST 45* 37  ALT 15 17   ALKPHOS 73 63  BILITOT  1.0 0.6  PROT 8.7* 7.7  ALBUMIN 3.2* 2.9*   No results for input(s): LIPASE, AMYLASE in the last 168 hours. No results for input(s): AMMONIA in the last 168 hours. CBC: Recent Labs  Lab 07/26/20 1409 07/26/20 1409 07/28/20 0307 07/29/20 0315 07/30/20 1422 07/31/20 1532 08/01/20 0836  WBC 12.0*   < > 12.0* 13.2* 13.7* 13.6* 14.0*  NEUTROABS 9.8*  --   --   --   --   --   --   HGB 9.1*   < > 8.3* 7.8* 8.3* 8.6* 8.9*  HCT 28.1*   < > 26.6* 24.0* 26.4* 27.4* 28.6*  MCV 102.6*   < > 104.3* 104.3* 106.0* 105.0* 106.3*  PLT 202   < > 164 157 176 167 173   < > = values in this interval not displayed.   Cardiac Enzymes: Recent Labs  Lab 07/26/20 1409 07/27/20 0212  CKTOTAL 958* 690*   BNP: Invalid input(s): POCBNP CBG: No results for input(s): GLUCAP in the last 168 hours. D-Dimer No results for input(s): DDIMER in the last 72 hours. Hgb A1c No results for input(s): HGBA1C in the last 72 hours. Lipid Profile No results for input(s): CHOL, HDL, LDLCALC, TRIG, CHOLHDL, LDLDIRECT in the last 72 hours. Thyroid function studies No results for input(s): TSH, T4TOTAL, T3FREE, THYROIDAB in the last 72 hours.  Invalid input(s): FREET3 Anemia work up No results for input(s): VITAMINB12, FOLATE, FERRITIN, TIBC, IRON, RETICCTPCT in the last 72 hours. Urinalysis    Component Value Date/Time   COLORURINE YELLOW 07/26/2020 [place] 07/26/2020 1244   LABSPEC 1.012 07/26/2020 1244   PHURINE 5.0 07/26/2020 1244   GLUCOSEU NEGATIVE 07/26/2020 1244   HGBUR MODERATE (A) 07/26/2020 [place] 07/26/2020 [place] 07/26/2020 1244   PROTEINUR 100 (A) 07/26/2020 1244   UROBILINOGEN 0.2 09/27/2011 1317   NITRITE NEGATIVE 07/26/2020 1244   LEUKOCYTESUR NEGATIVE 07/26/2020 1244   Sepsis Labs Invalid input(s): PROCALCITONIN,  WBC,  LACTICIDVEN Microbiology Recent Results (from the past 240 hour(s))  Urine culture      Status: Abnormal   Collection Time: 07/26/20 12:44 PM   Specimen: Urine, Random  Result Value Ref Range Status   Specimen Description URINE, RANDOM  Final   Special Requests   Final    NONE Performed at [facility], [place] [address], [place], [place] [address]    Culture 30,000 COLONIES/mL STAPHYLOCOCCUS EPIDERMIDIS (A)  Final   Report Status 07/28/2020 FINAL  Final   Organism ID, Bacteria STAPHYLOCOCCUS EPIDERMIDIS (A)  Final      Susceptibility   Staphylococcus epidermidis - MIC*    CIPROFLOXACIN <=0.5 SENSITIVE Sensitive     GENTAMICIN <=0.5 SENSITIVE Sensitive     NITROFURANTOIN <=16 SENSITIVE Sensitive     OXACILLIN >=4 RESISTANT Resistant     TETRACYCLINE >=16 RESISTANT Resistant     VANCOMYCIN 1 SENSITIVE Sensitive     TRIMETH/SULFA 20 SENSITIVE Sensitive     CLINDAMYCIN >=8 RESISTANT Resistant     RIFAMPIN 1 SENSITIVE Sensitive     Inducible Clindamycin NEGATIVE Sensitive     * 30,000 COLONIES/mL STAPHYLOCOCCUS EPIDERMIDIS  SARS Coronavirus 2 by RT PCR (hospital order, performed in [place] hospital lab) Nasopharyngeal Nasopharyngeal Swab     Status: None   Collection Time: 07/26/20  2:12 PM   Specimen: Nasopharyngeal Swab  Result Value Ref Range Status   SARS Coronavirus 2 NEGATIVE NEGATIVE Final    Comment: (NOTE)  SARS-CoV-2 target nucleic acids are NOT DETECTED.  The SARS-CoV-2 RNA is generally detectable in upper and lower respiratory specimens during the acute phase of infection. The lowest concentration of SARS-CoV-2 viral copies this assay can detect is 250 copies / mL. A negative result does not preclude SARS-CoV-2 infection and should not be used as the sole basis for treatment or other patient management decisions.  A negative result may occur with improper specimen collection / handling, submission of specimen other than nasopharyngeal swab, presence of viral mutation(s) within the areas targeted by this assay, and inadequate number of viral  copies (<250 copies / mL). A negative result must be combined with clinical observations, patient history, and epidemiological information.  Fact Sheet for Patients:   StrictlyIdeas.no  Fact Sheet for Healthcare Providers: BankingDealers.co.za  This test is not yet approved or  cleared by the [place] FDA and has been authorized for detection and/or diagnosis of SARS-CoV-2 by FDA under an Emergency Use Authorization (EUA).  This EUA will remain in effect (meaning this test can be used) for the duration of the COVID-19 declaration under Section 564(b)(1) of the Act, 21 U.S.C. section 360bbb-3(b)(1), unless the authorization is terminated or revoked sooner.  Performed at [facility], [place] [address] [place], [place] [address]   Blood culture (routine x 2)     Status: None   Collection Time: 07/26/20  2:57 PM   Specimen: BLOOD  Result Value Ref Range Status   Specimen Description BLOOD RIGHT ANTECUBITAL  Final   Special Requests   Final    BOTTLES DRAWN AEROBIC AND ANAEROBIC Blood Culture adequate volume   Culture   Final    NO GROWTH 5 DAYS Performed at [facility], [place] [address] [place], [place] [address]    Report Status 07/31/2020 FINAL  Final  Blood culture (routine x 2)     Status: None   Collection Time: 07/26/20  3:02 PM   Specimen: BLOOD LEFT HAND  Result Value Ref Range Status   Specimen Description BLOOD LEFT HAND  Final   Special Requests   Final    BOTTLES DRAWN AEROBIC AND ANAEROBIC Blood Culture adequate volume   Culture   Final    NO GROWTH 5 DAYS Performed at [facility], [place] [address], [place], [place] [address]    Report Status 07/31/2020 FINAL  Final  Surgical pcr screen     Status: None   Collection Time: 07/27/20  6:03 AM   Specimen: Nasal Mucosa; Nasal Swab  Result Value Ref Range Status   MRSA, PCR NEGATIVE NEGATIVE Final   Staphylococcus aureus NEGATIVE NEGATIVE Final     Comment: (NOTE) The Xpert SA Assay (FDA approved for NASAL specimens in patients 95 years of age and older), is one component of a comprehensive surveillance program. It is not intended to diagnose infection nor to guide or monitor treatment. Performed at [facility], [place] [address], [place], [place] [address]   SARS CORONAVIRUS 2 (TAT 6-24 HRS) Nasopharyngeal Nasopharyngeal Swab     Status: None   Collection Time: 07/31/20  5:55 PM   Specimen: Nasopharyngeal Swab  Result Value Ref Range Status   SARS Coronavirus 2 NEGATIVE NEGATIVE Final    Comment: (NOTE) SARS-CoV-2 target nucleic acids are NOT DETECTED.  The SARS-CoV-2 RNA is generally detectable in upper and lower respiratory specimens during the acute phase of infection. Negative results do not preclude SARS-CoV-2 infection, do not rule out co-infections with  other pathogens, and should not be used as the sole basis for treatment or other patient management decisions. Negative results must be combined with clinical observations, patient history, and epidemiological information. The expected result is Negative.  Fact Sheet for Patients: SugarRoll.be  Fact Sheet for Healthcare Providers: https://www.woods-mathews.com/  This test is not yet approved or cleared by the [place] FDA and  has been authorized for detection and/or diagnosis of SARS-CoV-2 by FDA under an Emergency Use Authorization (EUA). This EUA will remain  in effect (meaning this test can be used) for the duration of the COVID-19 declaration under Se ction 564(b)(1) of the Act, 21 U.S.C. section 360bbb-3(b)(1), unless the authorization is terminated or revoked sooner.  Performed at [facility], [place] [address] [place], [place] [address]      Time coordinating discharge: Over 30 minutes  SIGNED:   Charlynne Cousins, MD  [facility] 08/01/2020, 9:59 AM Pager   If 7PM-7AM, please  contact night-coverage [email or web address] Password [email or web address]

## 2020-08-04 ENCOUNTER — Non-Acute Institutional Stay (SKILLED_NURSING_FACILITY): Payer: 59 | Admitting: Adult Health

## 2020-08-04 DIAGNOSIS — M1A9XX0 Chronic gout, unspecified, without tophus (tophi): Secondary | ICD-10-CM

## 2020-08-04 DIAGNOSIS — D638 Anemia in other chronic diseases classified elsewhere: Secondary | ICD-10-CM | POA: Diagnosis not present

## 2020-08-04 DIAGNOSIS — S73005S Unspecified dislocation of left hip, sequela: Secondary | ICD-10-CM

## 2020-08-04 DIAGNOSIS — I4892 Unspecified atrial flutter: Secondary | ICD-10-CM | POA: Diagnosis not present

## 2020-08-04 DIAGNOSIS — J189 Pneumonia, unspecified organism: Secondary | ICD-10-CM

## 2020-08-04 DIAGNOSIS — N184 Chronic kidney disease, stage 4 (severe): Secondary | ICD-10-CM

## 2020-08-04 DIAGNOSIS — I10 Essential (primary) hypertension: Secondary | ICD-10-CM

## 2020-08-05 ENCOUNTER — Non-Acute Institutional Stay (SKILLED_NURSING_FACILITY): Payer: 59 | Admitting: Internal Medicine

## 2020-08-05 ENCOUNTER — Encounter: Payer: Self-pay | Admitting: Internal Medicine

## 2020-08-05 ENCOUNTER — Encounter: Payer: Self-pay | Admitting: Adult Health

## 2020-08-05 DIAGNOSIS — J189 Pneumonia, unspecified organism: Secondary | ICD-10-CM | POA: Diagnosis not present

## 2020-08-05 DIAGNOSIS — N184 Chronic kidney disease, stage 4 (severe): Secondary | ICD-10-CM

## 2020-08-05 DIAGNOSIS — M1A9XX0 Chronic gout, unspecified, without tophus (tophi): Secondary | ICD-10-CM

## 2020-08-05 DIAGNOSIS — I10 Essential (primary) hypertension: Secondary | ICD-10-CM

## 2020-08-05 DIAGNOSIS — S73005D Unspecified dislocation of left hip, subsequent encounter: Secondary | ICD-10-CM | POA: Diagnosis not present

## 2020-08-05 DIAGNOSIS — E1122 Type 2 diabetes mellitus with diabetic chronic kidney disease: Secondary | ICD-10-CM

## 2020-08-05 NOTE — Assessment & Plan Note (Addendum)
9/18-9/24/2021 admission BUN 112 and creatinine 4.93. Lasix dose decreased.   9/24 BUN 156, creatinine 4.53 and GFR 15. Nephrology outpatient follow-up will be verified. He is on high-dose allopurinol.  Uric acid level is not recorded in the chart.  This will be checked and the allopurinol decreased to 50 mg  M,W & F

## 2020-08-05 NOTE — Assessment & Plan Note (Addendum)
Asymptomatic Allopurinol dose is 300 mg; this will be decreased to 50 mg M,W,& F due to CKD Stage 4 Recheck uric acid level.

## 2020-08-05 NOTE — Progress Notes (Signed)
NURSING HOME LOCATION:  [facility] ROOM NUMBER:  [id number]  CODE STATUS:  Full Code  PCP:  Delrae Rend, MD  This is a comprehensive admission note to [facility] performed on this date less than 30 days from date of admission. Included are preadmission medical/surgical history; reconciled medication list; family history; social history and comprehensive review of systems.  Corrections and additions to the records were documented. Comprehensive physical exam was also performed. Additionally a clinical summary was entered for each active diagnosis pertinent to this admission in the Problem List to enhance continuity of care.  HPI: He was hospitalized 9/18-9/24/2021, admitted with inability to walk related to dislocated left hip post mechanical fall associated with possible sepsis and pneumonia.  Orthopedic surgery consulted and reduced the dislocated hip on 9/19.  Perioperatively a flutter was documented.  Cardiology was consulted and beta-blocker and Eliquis initiated with consideration for possible cardioversion as an outpatient.  Heart rate varied from 100-110.  Echo revealed EF of 60%. Empirically Rocephin and doxycycline were initiated; full course was completed while hospitalized. Known CKD stage IV was asymptomatic. Lasix dose was decreased due to a rise in the creatinine.  Nephrology outpatient follow-up was recommended.BMP & CBC were recommended in 1 week post discharge.  Past medical and surgical history: Includes history of gout, iron deficiency anemia, essential hypertension, osteoporosis, CKD stage IV, and history of bleeding ulcer requiring 6 unit transfusion. Surgeries and procedures include AV fistula placement, shoulder arthroscopy with rotator cuff repair, total hip arthroplasty x2, and surgical repair of the bleeding ulcer.  Social history: Social drinker; formerly one fourth pack per day smoker with history of 8 pack years.  He now states that a pack will last him  a week and he may not smoke for days.  Family history: Reviewed   Review of systems:   SLUMS MMSE was performed 9/24 as there was some concern he may have hit his head when he fell.  His score was 28 out of 30 which is within the normal range. He states that there was no cardiac or neurologic prodrome prior to his fall.  He states that he slipped in his kitchen in water which had collected around the refrigerator, apparently from melted ice. He is unaware of any cardiac dysrhythmias. He states he was unaware of his a community-acquired pneumonia.  He does have intermittent phlegm production.  He denies any active infectious symptoms. He denies any bleeding dyscrasias despite his significant anemia. He validates that he is on no medications for diabetes.  He does not check sugars at home.  He sees Dr. Buddy Duty every 6 months and was last seen July 20.  Nephrology appointment with Dr. Posey Pronto at [facility] was approximately 3 weeks ago.  He believes he has a follow-up appointment in December of this year.  Constitutional: No fever, significant weight change, fatigue  Eyes: No redness, discharge, pain, vision change ENT/mouth: No nasal congestion, purulent discharge, earache, change in hearing, sore throat  Cardiovascular: No chest pain, palpitations, paroxysmal nocturnal dyspnea, claudication, edema  Respiratory: No hemoptysis, DOE, significant snoring, apnea Gastrointestinal: No heartburn, dysphagia, abdominal pain, nausea /vomiting, rectal bleeding, melena, change in bowels Genitourinary: No dysuria, hematuria, pyuria, incontinence, nocturia Musculoskeletal: No joint stiffness, joint swelling, weakness,significant  pain Dermatologic: No rash, pruritus, change in appearance of skin except for R thigh lesion Neurologic: No dizziness, headache, syncope, seizures, numbness, tingling Psychiatric: No significant anxiety, depression, insomnia, anorexia Endocrine: No change in hair/skin/nails, excessive  thirst, excessive hunger, excessive  urination  Hematologic/lymphatic: No significant bruising, lymphadenopathy, abnormal bleeding Allergy/immunology: No itchy/watery eyes, significant sneezing, urticaria, angioedema  Physical exam:  Pertinent or positive findings: He is obese.  Hair is disheveled.  He has a mustache; beard is full and white.  Arcus senilis is present.  Gynecomastia is present.  Breath sounds are decreased.  Heart rhythm is irregular.  A vertical well-healed operative scar is present mid abdomen. He has several keratinized papuleshorizontally over the mid abdomen.  Hyperpigmented striae are noted over the abdomen.  Umbilical hernia is present.  He has trace edema at the sock line.  Pedal pulses are strong.  There is a pulse deficit noted.  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  Wound care nurse reports an abrasion in the right medial thigh but no significant ulcer.  General appearance: Adequately nourished; no acute distress, increased work of breathing is present.   Lymphatic: No lymphadenopathy about the head, neck, axilla. Eyes: No conjunctival inflammation or lid edema is present. There is no scleral icterus. Ears:  External ear exam shows no significant lesions or deformities.   Nose:  External nasal examination shows no deformity or inflammation. Nasal mucosa are pink and moist without lesions, exudates Oral exam: Lips and gums are healthy appearing.There is no oropharyngeal erythema or exudate. Neck:  No thyromegaly, masses, tenderness noted.    Heart:  No gallop, murmur, click, rub.  Lungs:without wheezes, rhonchi, rales, rubs. Abdomen: Bowel sounds are normal.  Abdomen is soft and nontender with no organomegaly, masses. GU: Deferred  Extremities:  No cyanosis, clubbing. Neurologic exam:  Strength equal  in upper & lower extremities. Skin: Warm & dry w/o tenting. No significant rash.  See clinical summary under each active problem  in the Problem List with associated updated therapeutic plan

## 2020-08-05 NOTE — Assessment & Plan Note (Addendum)
Serial glucoses ranged from a low of 100 up to a high of 136 while hospitalized.  Last A1c on record in Epic is nondiabetic at 5.4% on 10/06/2011.   Dr. Buddy Duty monitors his diabetes every 6 months; he was last seen July 20.

## 2020-08-05 NOTE — Assessment & Plan Note (Addendum)
He states that the pain is well controlled at this time. PT/OT @ SNF.

## 2020-08-05 NOTE — Progress Notes (Signed)
Location:  [facility] Room Number: 212-A Place of Service:  SNF (31) Provider:  Durenda Age, DNP, FNP-BC  Patient Care Team: Mark Rend, MD as PCP - General (Internal Medicine) Mark Maes, MD as Consulting Physician (Nephrology)  Extended Emergency Contact Information Primary Emergency Contact: Mark [name] [place] of [place] Phone: [phone] Relation: Brother  Code Status:  FULL CODE  Goals of care: Advanced Directive information Advanced Directives 08/05/2020  Does Patient Have a Medical Advance Directive? No  Would patient like information on creating a medical advance directive? No - Patient declined  Pre-existing out of facility DNR order (yellow form or pink MOST form) -     Chief Complaint  Patient presents with  . Acute Visit    Hospital followup, status post hospitalization at [facility] 9/18-9/24/21 for closed left hip dislocation    HPI:  Pt is a 59 y.o. male seen today for hospital follow-up.  He was admitted to [facility] on 08/01/20 post [facility] hospitalization 07/26/20 to 08/01/20 for a closed left hip dislocation.  He has a PMH of chronic kidney disease stage V, gout and essential hypertension. Day before hospitalization, he slipped in some water and fell onto his left hip. He was unable to get up and laid for about 36 hours until such time he was able to crawl to a phone and called EMS and was brought to ED. Imaging showed left hip dislocation. Orthopedic was consulted and closed reduction was performed on 07/27/20. He has had a cough since lying on the floor. He was started empirically on Rocephin and doxycycline and has completed his course. He had a new onset atrial flutter. Heart rate has been ranging from 100-110. Cardiology was consulted and was started on betablocker and Eliquis.    Past Medical History:  Diagnosis Date  . Anemia    labs today indicate (09/27/11)  . Arthritis    right  knee  . Bleeding ulcer    hx of, required 6 units blood  . Blood transfusion    6 units with ulcer repair surgery  . Chronic kidney disease    stage IV, has fistula, never started diaylsis  . Complication of anesthesia    little slow to wake up with last surgery  . Dyspnea    with exertion  . Gout   . Hypertension   . Osteoporosis   . Pneumonia    Past Surgical History:  Procedure Laterality Date  . AV FISTULA PLACEMENT Left 09/07/2016   Procedure: ARTERIOVENOUS (AV) FISTULA CREATION LEFT ARM;  Surgeon: Angelia Mould, MD;  Location: [place];  Service: Vascular;  Laterality: Left;  . COLONOSCOPY    . EXTERNAL EAR SURGERY    . HIP CLOSED REDUCTION Left 07/27/2020   Procedure: CLOSED REDUCTION HIP;  Surgeon: Marchia Bond, MD;  Location: [place];  Service: Orthopedics;  Laterality: Left;  . HIP PINNING  11/2010  . JOINT REPLACEMENT    . OTHER SURGICAL HISTORY  ~ 08/1998   repair bleeding ulcer  . SHOULDER ARTHROSCOPY WITH ROTATOR CUFF REPAIR AND SUBACROMIAL DECOMPRESSION Right 05/26/2017   Procedure: SHOULDER ARTHROSCOPY WITH ROTATOR CUFF REPAIR AND SUBACROMIAL DECOMPRESSION;  Surgeon: Tania Ade, MD;  Location: [place];  Service: Orthopedics;  Laterality: Right;  SHOULDER ARTHROSCOPY WITH ROTATOR CUFF REPAIR AND SUBACROMIAL DECOMPRESSION  . TOTAL HIP ARTHROPLASTY  10/06/11   left  . TOTAL HIP ARTHROPLASTY  10/06/2011   Procedure: TOTAL HIP ARTHROPLASTY;  Surgeon: Kerin Salen;  Location: [place];  Service: Orthopedics;  Laterality: Left;    Allergies  Allergen Reactions  . Penicillins Swelling, Rash and Other (See Comments)    Arms and eyes swell & skin breaks out  Has patient had a PCN reaction causing immediate rash, facial/tongue/throat swelling, SOB or lightheadedness with hypotension: Yes Has patient had a PCN reaction causing severe rash involving mucus membranes or skin necrosis: Yes Has patient had a PCN reaction that required hospitalization: No Has patient had  a PCN reaction occurring within the last 10 years: No If all of the above answers are "NO", then may proceed with Cephalosporin use.     Outpatient Encounter Medications as of 08/04/2020  Medication Sig  . acetaminophen (TYLENOL) 325 MG tablet Take 325 mg by mouth every 6 (six) hours as needed for mild pain or headache.   . allopurinol (ZYLOPRIM) 100 MG tablet Take 50 mg by mouth every Monday, Wednesday, and Friday.  Marland Kitchen apixaban (ELIQUIS) 5 MG TABS tablet Take 1 tablet (5 mg total) by mouth 2 (two) times daily.  . Cholecalciferol (VITAMIN D3 PO) Take 1 tablet by mouth daily after breakfast.  . ferrous sulfate 325 (65 FE) MG tablet Take 325 mg by mouth daily with breakfast.  . furosemide (LASIX) 40 MG tablet Take 1 tablet (40 mg total) by mouth 2 (two) times daily.  . metoprolol tartrate (LOPRESSOR) 100 MG tablet Take 1 tablet (100 mg total) by mouth 2 (two) times daily.  . Multiple Vitamin (MULTIVITAMIN WITH MINERALS) TABS tablet Take 1 tablet by mouth daily after breakfast.  . sodium bicarbonate 650 MG tablet Take 650 mg by mouth 3 (three) times daily.   . [DISCONTINUED] allopurinol (ZYLOPRIM) 300 MG tablet Take 300 mg by mouth daily after breakfast.    No facility-administered encounter medications on file as of 08/04/2020.    Review of Systems  GENERAL: No change in appetite, no fatigue, no weight changes, no fever, chills or weakness MOUTH and THROAT: Denies oral discomfort, gingival pain or bleeding RESPIRATORY: no cough, SOB, DOE, wheezing, hemoptysis CARDIAC: No chest pain, edema or palpitations GI: No abdominal pain, diarrhea, constipation, heart burn, nausea or vomiting GU: Denies dysuria, frequency, hematuria, incontinence, or discharge NEUROLOGICAL: Denies dizziness, syncope, numbness, or headache PSYCHIATRIC: Denies feelings of depression or anxiety. No report of hallucinations, insomnia, paranoia, or agitation   Pertinent  Health Maintenance Due  Topic Date Due  . FOOT  EXAM  Never done  . OPHTHALMOLOGY EXAM  Never done  . URINE MICROALBUMIN  Never done  . COLONOSCOPY  Never done  . HEMOGLOBIN A1C  04/04/2012  . INFLUENZA VACCINE  Never done    Vitals:   08/05/20 1458  BP: 112/74  Pulse: 78  Resp: 20  Temp: (!) 97.3 F (36.3 C)  TempSrc: Oral  Weight: 248 lb (112.5 kg)  Height: 5\' 9"  (1.753 m)   Body mass index is 36.62 kg/m.  Physical Exam  GENERAL APPEARANCE: Well nourished. In no acute distress. Obese SKIN:  Skin is warm and dry.  MOUTH and THROAT: Lips are without lesions. Oral mucosa is moist and without lesions. Tongue is normal in shape, size, and color and without lesions RESPIRATORY: Breathing is even & unlabored, BS CTAB CARDIAC: Irregular heart rate, + murmur,no extra heart sounds, no edema GI: Abdomen soft, normal BS, no masses, no tenderness EXTREMITIES:  Able to move X 4 extremities. NEUROLOGICAL: There is no tremor. Speech is clear. Alert and oriented X 3. PSYCHIATRIC:  Affect and behavior  are appropriate  Labs reviewed: Recent Labs    04/30/20 0431 04/30/20 0431 05/01/20 0453 05/01/20 0453 05/02/20 0538 05/03/20 0918 07/26/20 1409 07/28/20 0307 07/30/20 1422 07/31/20 1532 08/01/20 0836  NA 140   < > 141   < > 140   < > 139   < > 138 139 138  K 3.9   < > 4.0   < > 3.9   < > 4.5   < > 4.4 5.0 4.6  CL 105   < > 110   < > 109   < > 105   < > 107 105 106  CO2 20*   < > 20*   < > 18*   < > 16*   < > 18* 17* 14*  GLUCOSE 84   < > 91   < > 77   < > 109*   < > 112* 100* 119*  BUN 89*   < > 80*   < > 74*   < > 112*   < > 150* 160* 156*  CREATININE 4.73*   < > 4.48*   < > 4.39*   < > 4.93*   < > 5.25* 4.88* 4.53*  CALCIUM 10.9*   < > 10.6*   < > 10.5*   < > 12.3*   < > 11.2* 11.1* 11.5*  MG  --   --   --   --   --   --  2.0  --   --   --   --   PHOS 6.9*  --  5.7*  --  5.2*  --   --   --   --   --   --    < > = values in this interval not displayed.   Recent Labs    04/29/20 0826 07/26/20 1409 07/28/20 0307  AST  20 45* 37  ALT 14 15 17   ALKPHOS 61 73 63  BILITOT 0.8 1.0 0.6  PROT 8.5* 8.7* 7.7  ALBUMIN 3.1* 3.2* 2.9*   Recent Labs    07/26/20 1409 07/28/20 0307 07/30/20 1422 07/31/20 1532 08/01/20 0836  WBC 12.0*   < > 13.7* 13.6* 14.0*  NEUTROABS 9.8*  --   --   --   --   HGB 9.1*   < > 8.3* 8.6* 8.9*  HCT 28.1*   < > 26.4* 27.4* 28.6*  MCV 102.6*   < > 106.0* 105.0* 106.3*  PLT 202   < > 176 167 173   < > = values in this interval not displayed.   Lab Results  Component Value Date   TSH 1.174 07/26/2020   Lab Results  Component Value Date   HGBA1C 5.4 10/06/2011    Significant Diagnostic Results in last 30 days:  DG Chest 2 View  Result Date: 07/27/2020 CLINICAL DATA:  Short of breath. EXAM: CHEST - 2 VIEW COMPARISON:  07/26/2020 FINDINGS: Stable cardiac enlargement. Persistent nodular opacity within the lateral right lung base, along the right border, is noted which may reflect atelectasis or airspace disease. Mild platelike atelectasis in the left midlung is new from previous exam. No findings of pleural effusion or interstitial edema. IMPRESSION: Persistent nodular opacity within the lateral right lung base which may reflect atelectasis or airspace disease. Continued follow-up imaging is advised to ensure resolution. If this does not improve thin more definitive characterization with CT of the chest would be recommended to assess for underlying lung nodule. Electronically Signed   By:  Kerby Moors M.D.   On: 07/27/2020 08:57   DG Lumbar Spine Complete  Result Date: 07/26/2020 CLINICAL DATA:  Pain after fall.  Hip deformity. EXAM: LUMBAR SPINE - COMPLETE 4+ VIEW COMPARISON:  None. FINDINGS: Patient is status post left hip replacement. On limited images, there is a dislocation of the femoral component versus the acetabular component. Pelvic bones otherwise normal. Multilevel degenerative disc disease. No fracture or malalignment. Lower lumbar facet degenerative changes noted.  IMPRESSION: 1. Left hip dislocation.  See the dedicated left hip films. 2. Degenerative disc disease and lower lumbar facet degenerative changes in the lumbar spine. 3. No fracture or traumatic malalignment. Electronically Signed   By: Dorise Bullion III M.D   On: 07/26/2020 14:02   CT Head Wo Contrast  Result Date: 07/26/2020 CLINICAL DATA:  Status post fall. EXAM: CT HEAD WITHOUT CONTRAST TECHNIQUE: Contiguous axial images were obtained from the base of the skull through the vertex without intravenous contrast. COMPARISON:  None. FINDINGS: Brain: There is mild cerebral atrophy with widening of the extra-axial spaces and ventricular dilatation. There are areas of decreased attenuation within the white matter tracts of the supratentorial brain, consistent with microvascular disease changes. Vascular: No hyperdense vessel or unexpected calcification. Skull: Normal. Negative for fracture or focal lesion. Sinuses/Orbits: No acute finding. Other: Mild posterolateral scalp soft tissue swelling is seen along the vertex on the right. IMPRESSION: 1. Mild cerebral atrophy. 2. No acute intracranial abnormality. Electronically Signed   By: Virgina Norfolk M.D.   On: 07/26/2020 15:23   CT Cervical Spine Wo Contrast  Result Date: 07/26/2020 CLINICAL DATA:  Status post fall. EXAM: CT CERVICAL SPINE WITHOUT CONTRAST TECHNIQUE: Multidetector CT imaging of the cervical spine was performed without intravenous contrast. Multiplanar CT image reconstructions were also generated. COMPARISON:  None. FINDINGS: Alignment: Normal. Skull base and vertebrae: No acute fracture. No primary bone lesion or focal pathologic process. Chronic changes are seen along the anterior arch of the C1 vertebral body. Soft tissues and spinal canal: No prevertebral fluid or swelling. No visible canal hematoma. Disc levels: Moderate severity endplate sclerosis is seen at the level of C6-C7. There is moderate severity narrowing of the anterior  atlantoaxial articulation. Marked severity intervertebral disc space narrowing is seen at this level. Mild intervertebral disc space narrowing is noted at the level of C7-T1. Mild, bilateral multilevel facet joint hypertrophy is noted. Upper chest: Negative. Other: None. IMPRESSION: 1. No acute osseous abnormality. 2. Marked severity degenerative disc disease at the level of C6-C7. Electronically Signed   By: Virgina Norfolk M.D.   On: 07/26/2020 15:29   DG Chest Portable 1 View  Result Date: 07/26/2020 CLINICAL DATA:  Low-grade fever and fall. EXAM: PORTABLE CHEST 1 VIEW COMPARISON:  10/12/2011 FINDINGS: Lordotic technique is demonstrated. Patient is rotated to the right. Lungs are adequately inflated with opacification over the right midlung which may be due to atelectasis or infection. No effusion. Stable cardiomegaly. Remainder of the exam is unchanged. IMPRESSION: 1. Opacification over the right midlung which may be due to atelectasis or infection. 2. Stable cardiomegaly. Electronically Signed   By: Marin Olp M.D.   On: 07/26/2020 14:30   DG C-Arm 1-60 Min  Result Date: 07/27/2020 CLINICAL DATA:  Post left hip reduction. EXAM: OPERATIVE LEFT HIP (WITH PELVIS IF PERFORMED) 2 VIEWS TECHNIQUE: Fluoroscopic spot image(s) were submitted for interpretation post-operatively. COMPARISON:  Left hip radiographs-07/26/2020 FINDINGS: Two spot fluoroscopic images of the left hip are provided for review and demonstrate apparent reduction  of previously identified dislocated left total hip prosthesis. IMPRESSION: Post reduction of left total hip prosthesis without apparent complication. Electronically Signed   By: Sandi Mariscal M.D.   On: 07/27/2020 09:29   ECHOCARDIOGRAM COMPLETE  Result Date: 07/27/2020    ECHOCARDIOGRAM REPORT   Patient Name:   Mark [name] Date of Exam: 07/27/2020 Medical Rec #:  [id number]        Height:       69.0 in Accession #:    [id number]       Weight:       255.0 lb Date of Birth:   Aug 15, 1961        BSA:          2.291 m Patient Age:    7 years         BP:           157/81 mmHg Patient Gender: M                HR:           114 bpm. Exam Location:  Inpatient Procedure: 2D Echo, Cardiac Doppler, Color Doppler and Intracardiac            Opacification Agent Indications:    I50.33 Acute on chronic diastolic (congestive) heart failure  History:        Patient has no prior history of Echocardiogram examinations.                 Signs/Symptoms:Dyspnea; Risk Factors:Hypertension. CKD.  Sonographer:    Jonelle Sidle Dance Referring Phys: [id number] Caren Griffins  Sonographer Comments: Suboptimal parasternal window and patient is morbidly obese. IMPRESSIONS  1. Left ventricular ejection fraction, by estimation, is 60 to 65%. The left ventricle has normal function. Left ventricular endocardial border not optimally defined to evaluate regional wall motion. Left ventricular diastolic parameters are indeterminate.  2. Right ventricular systolic function was not well visualized. The right ventricular size is not well visualized.  3. Left atrial size was moderately dilated.  4. The mitral valve was not well visualized. No evidence of mitral valve regurgitation. No evidence of mitral stenosis.  5. The aortic valve was not well visualized. Aortic valve regurgitation is not visualized. No aortic stenosis is present.  6. The inferior vena cava is normal in size with greater than 50% respiratory variability, suggesting right atrial pressure of 3 mmHg. FINDINGS  Left Ventricle: Left ventricular ejection fraction, by estimation, is 60 to 65%. The left ventricle has normal function. Left ventricular endocardial border not optimally defined to evaluate regional wall motion. The left ventricular internal cavity size was normal in size. There is no left ventricular hypertrophy. Left ventricular diastolic parameters are indeterminate. Right Ventricle: Grossly RV appears normal in size and function. The right ventricular size  is not well visualized. Right vetricular wall thickness was not well visualized. Right ventricular systolic function was not well visualized. Left Atrium: Left atrial size was moderately dilated. Right Atrium: Right atrial size was not well visualized. Pericardium: There is no evidence of pericardial effusion. Mitral Valve: The mitral valve was not well visualized. No evidence of mitral valve regurgitation. No evidence of mitral valve stenosis. Tricuspid Valve: The tricuspid valve is not well visualized. Tricuspid valve regurgitation is not demonstrated. No evidence of tricuspid stenosis. Aortic Valve: The aortic valve was not well visualized. Aortic valve regurgitation is not visualized. No aortic stenosis is present. Aortic valve mean gradient measures 4.2 mmHg. Aortic valve peak gradient measures 8.1 mmHg. Aortic  valve area, by VTI measures 2.58 cm. Pulmonic Valve: The pulmonic valve was not well visualized. Pulmonic valve regurgitation is not visualized. No evidence of pulmonic stenosis. Aorta: The aortic root was not well visualized. Pulmonary Artery: Indeterminant PASP, inadequate TR jet. Venous: The inferior vena cava is normal in size with greater than 50% respiratory variability, suggesting right atrial pressure of 3 mmHg. IAS/Shunts: No atrial level shunt detected by color flow Doppler.  LEFT VENTRICLE PLAX 2D LVIDd:         4.20 cm LVIDs:         3.60 cm LV PW:         1.10 cm LV IVS:        0.90 cm LVOT diam:     2.00 cm LV SV:         58 LV SV Index:   25 LVOT Area:     3.14 cm  RIGHT VENTRICLE          IVC RV Basal diam:  2.40 cm  IVC diam: 2.20 cm TAPSE (M-mode): 2.1 cm LEFT ATRIUM              Index       RIGHT ATRIUM           Index LA diam:        4.60 cm  2.01 cm/m  RA Area:     24.60 cm LA Vol (A2C):   109.0 ml 47.58 ml/m RA Volume:   68.40 ml  29.86 ml/m LA Vol (A4C):   82.4 ml  35.97 ml/m LA Biplane Vol: 100.0 ml 43.65 ml/m  AORTIC VALVE AV Area (Vmax):    2.47 cm AV Area (Vmean):    2.26 cm AV Area (VTI):     2.58 cm AV Vmax:           142.37 cm/s AV Vmean:          98.965 cm/s AV VTI:            0.223 m AV Peak Grad:      8.1 mmHg AV Mean Grad:      4.2 mmHg LVOT Vmax:         112.09 cm/s LVOT Vmean:        71.096 cm/s LVOT VTI:          0.183 m LVOT/AV VTI ratio: 0.82  AORTA Ao Asc diam: 3.30 cm MITRAL VALVE MV Area (PHT): 4.07 cm     SHUNTS MV Decel Time: 187 msec     Systemic VTI:  0.18 m MV E velocity: 106.50 cm/s  Systemic Diam: 2.00 cm Carlyle Dolly MD Electronically signed by Carlyle Dolly MD Signature Date/Time: 07/27/2020/4:03:17 PM    Final    DG HIP OPERATIVE UNILAT W OR W/O PELVIS LEFT  Result Date: 07/27/2020 CLINICAL DATA:  Post left hip reduction. EXAM: OPERATIVE LEFT HIP (WITH PELVIS IF PERFORMED) 2 VIEWS TECHNIQUE: Fluoroscopic spot image(s) were submitted for interpretation post-operatively. COMPARISON:  Left hip radiographs-07/26/2020 FINDINGS: Two spot fluoroscopic images of the left hip are provided for review and demonstrate apparent reduction of previously identified dislocated left total hip prosthesis. IMPRESSION: Post reduction of left total hip prosthesis without apparent complication. Electronically Signed   By: Sandi Mariscal M.D.   On: 07/27/2020 09:29   DG Hip Unilat W or Wo Pelvis 2-3 Views Left  Result Date: 07/26/2020 CLINICAL DATA:  59 year old male with a history of fall EXAM: DG HIP (WITH OR WITHOUT PELVIS) 2-3V LEFT  COMPARISON:  CT 04/29/2020 FINDINGS: Posterior dislocation of the left hip arthroplasty. No fracture identified in the proximal femur. No fracture identified at the pelvis. Osteopenia. IMPRESSION: Posterior dislocation of the left hip arthroplasty components Electronically Signed   By: Corrie Mckusick D.O.   On: 07/26/2020 14:03    Assessment/Plan  1. Closed dislocation of left hip, sequela - S/P closed reduction on 07/27/20, for PT and OT for therapeutic strengthening exercises -Follow-up with orthopedics, Dr. [name], in 1  week  2. Atrial flutter with rapid ventricular response (HCC) -New onset, continue metoprolol tartrate for rate control and Eliquis for anticoagulation  3. Benign essential HTN -Stable, continue metoprolol tartrate and furosemide  4. Anemia of chronic disease Lab Results  Component Value Date   HGB 8.9 (L) 08/01/2020   -Continue ferrous sulfate   5. CKD (chronic kidney disease), stage IV [facility]) Lab Results  Component Value Date   NA 138 08/01/2020   K 4.6 08/01/2020   CO2 14 (L) 08/01/2020   GLUCOSE 119 (H) 08/01/2020   BUN 156 (H) 08/01/2020   CREATININE 4.53 (H) 08/01/2020   CALCIUM 11.5 (H) 08/01/2020   GFRNONAA 13 (L) 08/01/2020   GFRAA 15 (L) 08/01/2020   -Continue sodium bicarbonate  6. Chronic gout without tophus, unspecified cause, unspecified site -Continue allopurinol  7. Pneumonia of right middle lobe due to infectious organism -Was a started empirically on Rocephin and doxycycline, completed his course     Family/ staff Communication: Discussed plan of care with resident and charge nurse.  Labs/tests ordered: None  Goals of care:   Short-term care   Durenda Age, DNP, MSN, FNP-BC [facility] [phone] (Monday-Friday 8:00 a.m. - 5:00 p.m.) [phone] (after hours)

## 2020-08-05 NOTE — Assessment & Plan Note (Signed)
He describes occasional phlegm production but denies any constitutional or infectious symptoms at this time.  He states he was unaware that he had CAP.

## 2020-08-05 NOTE — Patient Instructions (Signed)
See assessment and plan under each diagnosis in the problem list and acutely for this visit 

## 2020-08-05 NOTE — Assessment & Plan Note (Signed)
BP controlled; no change in antihypertensive medications  

## 2020-08-07 LAB — COMPREHENSIVE METABOLIC PANEL
Calcium: 11.3 — AB (ref 8.7–10.7)
GFR calc Af Amer: 15.56
GFR calc non Af Amer: 15

## 2020-08-07 LAB — CBC: RBC: 2.38 — AB (ref 3.87–5.11)

## 2020-08-07 LAB — CBC AND DIFFERENTIAL
HCT: 25 — AB (ref 41–53)
Hemoglobin: 8.1 — AB (ref 13.5–17.5)
Neutrophils Absolute: 5
Platelets: 179 (ref 150–399)
WBC: 7.1

## 2020-08-07 LAB — BASIC METABOLIC PANEL
BUN: 121 — AB (ref 4–21)
CO2: 16 (ref 13–22)
Chloride: 105 (ref 99–108)
Creatinine: 4.4 — AB (ref 0.6–1.3)
Glucose: 88
Potassium: 4.1 (ref 3.4–5.3)
Sodium: 138 (ref 137–147)

## 2020-08-08 LAB — CBC AND DIFFERENTIAL
HCT: 26 — AB (ref 41–53)
Hemoglobin: 8.2 — AB (ref 13.5–17.5)
Neutrophils Absolute: 4
Platelets: 177 (ref 150–399)
WBC: 6.4

## 2020-08-08 LAB — COMPREHENSIVE METABOLIC PANEL
Calcium: 11.9 — AB (ref 8.7–10.7)
GFR calc Af Amer: 15
GFR calc non Af Amer: 15

## 2020-08-08 LAB — BASIC METABOLIC PANEL
BUN: 115 — AB (ref 4–21)
CO2: 18 (ref 13–22)
Chloride: 107 (ref 99–108)
Creatinine: 4.8 — AB (ref 0.6–1.3)
Glucose: 84
Potassium: 4.2 (ref 3.4–5.3)
Sodium: 138 (ref 137–147)

## 2020-08-08 LAB — CBC: RBC: 2.47 — AB (ref 3.87–5.11)

## 2020-08-14 ENCOUNTER — Encounter: Payer: Self-pay | Admitting: Adult Health

## 2020-08-14 ENCOUNTER — Non-Acute Institutional Stay (SKILLED_NURSING_FACILITY): Payer: 59 | Admitting: Adult Health

## 2020-08-14 DIAGNOSIS — N185 Chronic kidney disease, stage 5: Secondary | ICD-10-CM | POA: Diagnosis not present

## 2020-08-14 DIAGNOSIS — D638 Anemia in other chronic diseases classified elsewhere: Secondary | ICD-10-CM

## 2020-08-14 DIAGNOSIS — I4892 Unspecified atrial flutter: Secondary | ICD-10-CM

## 2020-08-14 DIAGNOSIS — S73005D Unspecified dislocation of left hip, subsequent encounter: Secondary | ICD-10-CM

## 2020-08-14 NOTE — Progress Notes (Signed)
Location:  [place] Room Number: 212-A Place of Service:  SNF (31) Provider:  Durenda Age, DNP, FNP-BC  Patient Care Team: Delrae Rend, MD as PCP - General (Internal Medicine) Jamal Maes, MD as Consulting Physician (Nephrology)  Extended Emergency Contact Information Primary Emergency Contact: Glennon [place] Phone: [phone] Relation: Brother  Code Status:  FULL CODE  Goals of care: Advanced Directive information Advanced Directives 08/05/2020  Does Patient Have a Medical Advance Directive? Yes  Does patient want to make changes to medical advance directive? No - Patient declined  Would patient like information on creating a medical advance directive? No - Patient declined  Pre-existing out of facility DNR order (yellow form or pink MOST form) -     Chief Complaint  Patient presents with   Medical Management of Chronic Issues    Short-term rehabilitation visit    HPI:  Pt is a 59 y.o. male seen today for medical management of chronic diseases.  He is a short-term rehabilitation resident of [facility].  He has a PMH of chronic kidney disease stage V, gout and essential hypertension. He was seen in his room today. He denies having any pain. He walks with the walker. HRs  ranging from 68 to 98. Current medication for atrial flutter is Eliquis 5 mg BID for anticoagulation and  Metoprolol tratrate 100 mg BID for rate control. Latest hgb 8.2 and creatinine 4.77/GFR <15, taken 08/08/20. He has anemia of chronic kidney disease stage 5. He currently takes FeSO4 325 mg daily for anemia.  He stated that whenever he discharges, he wants his prescriptions sent to [facility].  He was admitted to [place] on 08/01/20 post [facility] hospitalization 07/26/20 to 08/01/20 for a closed left hip dislocation. A day before this hospitalization, he slipped in some water and fell  onto his left hip.  He was unable to get up and laid for about 36 hours until such time he was able to crawl to a phone and called EMS and was brought to the ED.  Imaging showed left hip dislocation.  Orthopedic was consulted and closed reduction was performed on 07/27/2020.  He has had a cough since lying on the floor.  He was a started empirically on Rocephin and doxycycline and has completed his course.  He had a new onset atrial flutter.  Heart rate has been ranging from 100-110.  Cardiology was consulted and was a started on beta-blocker and Eliquis.  He is currently having PT and OT.   Past Medical History:  Diagnosis Date   Anemia    labs today indicate (09/27/11)   Arthritis    right knee   Bleeding ulcer    hx of, required 6 units blood   Blood transfusion    6 units with ulcer repair surgery   Chronic kidney disease    stage IV, has fistula, never started diaylsis   Complication of anesthesia    little slow to wake up with last surgery   Dyspnea    with exertion   Gout    Hypertension    Osteoporosis    Pneumonia    Past Surgical History:  Procedure Laterality Date   AV FISTULA PLACEMENT Left 09/07/2016   Procedure: ARTERIOVENOUS (AV) FISTULA CREATION LEFT ARM;  Surgeon: Angelia Mould, MD;  Location: [place];  Service: Vascular;  Laterality: Left;   COLONOSCOPY     EXTERNAL EAR SURGERY  HIP CLOSED REDUCTION Left 07/27/2020   Procedure: CLOSED REDUCTION HIP;  Surgeon: Marchia Bond, MD;  Location: [place];  Service: Orthopedics;  Laterality: Left;   HIP PINNING  11/2010   JOINT REPLACEMENT     OTHER SURGICAL HISTORY  ~ 08/1998   repair bleeding ulcer   SHOULDER ARTHROSCOPY WITH ROTATOR CUFF REPAIR AND SUBACROMIAL DECOMPRESSION Right 05/26/2017   Procedure: SHOULDER ARTHROSCOPY WITH ROTATOR CUFF REPAIR AND SUBACROMIAL DECOMPRESSION;  Surgeon: Tania Ade, MD;  Location: [place];  Service: Orthopedics;  Laterality: Right;  SHOULDER ARTHROSCOPY  WITH ROTATOR CUFF REPAIR AND SUBACROMIAL DECOMPRESSION   TOTAL HIP ARTHROPLASTY  10/06/11   left   TOTAL HIP ARTHROPLASTY  10/06/2011   Procedure: TOTAL HIP ARTHROPLASTY;  Surgeon: Kerin Salen;  Location: [place];  Service: Orthopedics;  Laterality: Left;    Allergies  Allergen Reactions   Penicillins Swelling, Rash and Other (See Comments)    Arms and eyes swell & skin breaks out  Has patient had a PCN reaction causing immediate rash, facial/tongue/throat swelling, SOB or lightheadedness with hypotension: Yes Has patient had a PCN reaction causing severe rash involving mucus membranes or skin necrosis: Yes Has patient had a PCN reaction that required hospitalization: No Has patient had a PCN reaction occurring within the last 10 years: No If all of the above answers are "NO", then may proceed with Cephalosporin use.     Outpatient Encounter Medications as of 08/14/2020  Medication Sig   acetaminophen (TYLENOL) 325 MG tablet Take 325 mg by mouth every 6 (six) hours as needed for mild pain or headache.    allopurinol (ZYLOPRIM) 100 MG tablet Take 50 mg by mouth every Monday, Wednesday, and Friday.   apixaban (ELIQUIS) 5 MG TABS tablet Take 1 tablet (5 mg total) by mouth 2 (two) times daily.   cholecalciferol (VITAMIN D) 25 MCG (1000 UNIT) tablet Take 1,000 Units by mouth daily.   ferrous sulfate 325 (65 FE) MG tablet Take 325 mg by mouth daily with breakfast.   furosemide (LASIX) 40 MG tablet Take 1 tablet (40 mg total) by mouth 2 (two) times daily.   metoprolol tartrate (LOPRESSOR) 100 MG tablet Take 1 tablet (100 mg total) by mouth 2 (two) times daily.   Multiple Vitamin (MULTIVITAMIN WITH MINERALS) TABS tablet Take 1 tablet by mouth daily after breakfast.   sodium bicarbonate 650 MG tablet Take 650 mg by mouth 3 (three) times daily.    [DISCONTINUED] Cholecalciferol (VITAMIN D3 PO) Take 1 tablet by mouth daily after breakfast.   No facility-administered encounter  medications on file as of 08/14/2020.    Review of Systems  GENERAL: No change in appetite, no fatigue, no weight changes, no fever, chills or weakness MOUTH and THROAT: Denies oral discomfort, gingival pain or bleeding RESPIRATORY: no cough, SOB, DOE, wheezing, hemoptysis CARDIAC: No chest pain or palpitations GI: No abdominal pain, diarrhea, constipation, heart burn, nausea or vomiting GU: Denies dysuria, frequency, hematuria, incontinence, or discharge NEUROLOGICAL: Denies dizziness, syncope, numbness, or headache PSYCHIATRIC: Denies feelings of depression or anxiety. No report of hallucinations, insomnia, paranoia, or agitation   Pertinent  Health Maintenance Due  Topic Date Due   FOOT EXAM  Never done   OPHTHALMOLOGY EXAM  Never done   URINE MICROALBUMIN  Never done   COLONOSCOPY  Never done   HEMOGLOBIN A1C  04/04/2012   INFLUENZA VACCINE  Never done    Vitals:   08/14/20 1000  BP: 117/85  Pulse: 98  Resp: 17  Temp: 98.3 F (36.8 C)  TempSrc: Oral  Weight: 249 lb (112.9 kg)  Height: 5\' 9"  (1.753 m)   Body mass index is 36.77 kg/m.  Physical Exam  GENERAL APPEARANCE: Well nourished. In no acute distress. Obese SKIN:  Skin is warm and dry.  MOUTH and THROAT: Lips are without lesions. Oral mucosa is moist and without lesions. Tongue is normal in shape, size, and color and without lesions RESPIRATORY: Breathing is even & unlabored, BS CTAB CARDIAC: RRR, +murmur,no extra heart sounds, no edema GI: Abdomen soft, normal BS, no masses, no tenderness EXTREMITIES:  Able to move X 4 extremities. NEUROLOGICAL: There is no tremor. Speech is clear. Alert and oriented X 3. PSYCHIATRIC:  Affect and behavior are appropriate   Labs reviewed: Recent Labs    04/30/20 0431 04/30/20 0431 05/01/20 0453 05/01/20 0453 05/02/20 0538 05/03/20 0918 07/26/20 1409 07/28/20 0307 07/30/20 1422 07/30/20 1422 07/31/20 1532 07/31/20 1532 08/01/20 0836 08/07/20 0000  08/08/20 0000  NA 140   < > 141   < > 140   < > 139   < > 138   < > 139   < > 138 138 138  K 3.9   < > 4.0   < > 3.9   < > 4.5   < > 4.4   < > 5.0   < > 4.6 4.1 4.2  CL 105   < > 110   < > 109   < > 105   < > 107   < > 105   < > 106 105 107  CO2 20*   < > 20*   < > 18*   < > 16*   < > 18*   < > 17*   < > 14* 16 18  GLUCOSE 84   < > 91   < > 77   < > 109*   < > 112*  --  100*  --  119*  --   --   BUN 89*   < > 80*   < > 74*   < > 112*   < > 150*   < > 160*   < > 156* 121* 115*  CREATININE 4.73*   < > 4.48*   < > 4.39*   < > 4.93*   < > 5.25*   < > 4.88*   < > 4.53* 4.4* 4.8*  CALCIUM 10.9*   < > 10.6*   < > 10.5*   < > 12.3*   < > 11.2*   < > 11.1*   < > 11.5* 11.3* 11.9*  MG  --   --   --   --   --   --  2.0  --   --   --   --   --   --   --   --   PHOS 6.9*  --  5.7*  --  5.2*  --   --   --   --   --   --   --   --   --   --    < > = values in this interval not displayed.   Recent Labs    04/29/20 0826 07/26/20 1409 07/28/20 0307  AST 20 45* 37  ALT 14 15 17   ALKPHOS 61 73 63  BILITOT 0.8 1.0 0.6  PROT 8.5* 8.7* 7.7  ALBUMIN 3.1* 3.2* 2.9*   Recent Labs    07/26/20 1409 07/28/20 7353  07/30/20 1422 07/30/20 1422 07/31/20 1532 07/31/20 1532 08/01/20 0836 08/07/20 0000 08/08/20 0000  WBC 12.0*   < > 13.7*   < > 13.6*   < > 14.0* 7.1 6.4  NEUTROABS 9.8*  --   --   --   --   --   --  5 4  HGB 9.1*   < > 8.3*   < > 8.6*   < > 8.9* 8.1* 8.2*  HCT 28.1*   < > 26.4*   < > 27.4*   < > 28.6* 25* 26*  MCV 102.6*   < > 106.0*  --  105.0*  --  106.3*  --   --   PLT 202   < > 176   < > 167   < > 173 179 177   < > = values in this interval not displayed.   Lab Results  Component Value Date   TSH 1.174 07/26/2020   Lab Results  Component Value Date   HGBA1C 5.4 10/06/2011   No results found for: CHOL, HDL, LDLCALC, LDLDIRECT, TRIG, CHOLHDL  Significant Diagnostic Results in last 30 days:  DG Chest 2 View  Result Date: 07/27/2020 CLINICAL DATA:  Short of breath. EXAM: CHEST -  2 VIEW COMPARISON:  07/26/2020 FINDINGS: Stable cardiac enlargement. Persistent nodular opacity within the lateral right lung base, along the right border, is noted which may reflect atelectasis or airspace disease. Mild platelike atelectasis in the left midlung is new from previous exam. No findings of pleural effusion or interstitial edema. IMPRESSION: Persistent nodular opacity within the lateral right lung base which may reflect atelectasis or airspace disease. Continued follow-up imaging is advised to ensure resolution. If this does not improve thin more definitive characterization with CT of the chest would be recommended to assess for underlying lung nodule. Electronically Signed   By: Kerby Moors M.D.   On: 07/27/2020 08:57   DG Lumbar Spine Complete  Result Date: 07/26/2020 CLINICAL DATA:  Pain after fall.  Hip deformity. EXAM: LUMBAR SPINE - COMPLETE 4+ VIEW COMPARISON:  None. FINDINGS: Patient is status post left hip replacement. On limited images, there is a dislocation of the femoral component versus the acetabular component. Pelvic bones otherwise normal. Multilevel degenerative disc disease. No fracture or malalignment. Lower lumbar facet degenerative changes noted. IMPRESSION: 1. Left hip dislocation.  See the dedicated left hip films. 2. Degenerative disc disease and lower lumbar facet degenerative changes in the lumbar spine. 3. No fracture or traumatic malalignment. Electronically Signed   By: Dorise Bullion III M.D   On: 07/26/2020 14:02   CT Head Wo Contrast  Result Date: 07/26/2020 CLINICAL DATA:  Status post fall. EXAM: CT HEAD WITHOUT CONTRAST TECHNIQUE: Contiguous axial images were obtained from the base of the skull through the vertex without intravenous contrast. COMPARISON:  None. FINDINGS: Brain: There is mild cerebral atrophy with widening of the extra-axial spaces and ventricular dilatation. There are areas of decreased attenuation within the white matter tracts of the  supratentorial brain, consistent with microvascular disease changes. Vascular: No hyperdense vessel or unexpected calcification. Skull: Normal. Negative for fracture or focal lesion. Sinuses/Orbits: No acute finding. Other: Mild posterolateral scalp soft tissue swelling is seen along the vertex on the right. IMPRESSION: 1. Mild cerebral atrophy. 2. No acute intracranial abnormality. Electronically Signed   By: Virgina Norfolk M.D.   On: 07/26/2020 15:23   CT Cervical Spine Wo Contrast  Result Date: 07/26/2020 CLINICAL DATA:  Status post fall. EXAM:  CT CERVICAL SPINE WITHOUT CONTRAST TECHNIQUE: Multidetector CT imaging of the cervical spine was performed without intravenous contrast. Multiplanar CT image reconstructions were also generated. COMPARISON:  None. FINDINGS: Alignment: Normal. Skull base and vertebrae: No acute fracture. No primary bone lesion or focal pathologic process. Chronic changes are seen along the anterior arch of the C1 vertebral body. Soft tissues and spinal canal: No prevertebral fluid or swelling. No visible canal hematoma. Disc levels: Moderate severity endplate sclerosis is seen at the level of C6-C7. There is moderate severity narrowing of the anterior atlantoaxial articulation. Marked severity intervertebral disc space narrowing is seen at this level. Mild intervertebral disc space narrowing is noted at the level of C7-T1. Mild, bilateral multilevel facet joint hypertrophy is noted. Upper chest: Negative. Other: None. IMPRESSION: 1. No acute osseous abnormality. 2. Marked severity degenerative disc disease at the level of C6-C7. Electronically Signed   By: Virgina Norfolk M.D.   On: 07/26/2020 15:29   DG Chest Portable 1 View  Result Date: 07/26/2020 CLINICAL DATA:  Low-grade fever and fall. EXAM: PORTABLE CHEST 1 VIEW COMPARISON:  10/12/2011 FINDINGS: Lordotic technique is demonstrated. Patient is rotated to the right. Lungs are adequately inflated with opacification over the  right midlung which may be due to atelectasis or infection. No effusion. Stable cardiomegaly. Remainder of the exam is unchanged. IMPRESSION: 1. Opacification over the right midlung which may be due to atelectasis or infection. 2. Stable cardiomegaly. Electronically Signed   By: Marin Olp M.D.   On: 07/26/2020 14:30   DG C-Arm 1-60 Min  Result Date: 07/27/2020 CLINICAL DATA:  Post left hip reduction. EXAM: OPERATIVE LEFT HIP (WITH PELVIS IF PERFORMED) 2 VIEWS TECHNIQUE: Fluoroscopic spot image(s) were submitted for interpretation post-operatively. COMPARISON:  Left hip radiographs-07/26/2020 FINDINGS: Two spot fluoroscopic images of the left hip are provided for review and demonstrate apparent reduction of previously identified dislocated left total hip prosthesis. IMPRESSION: Post reduction of left total hip prosthesis without apparent complication. Electronically Signed   By: Sandi Mariscal M.D.   On: 07/27/2020 09:29   ECHOCARDIOGRAM COMPLETE  Result Date: 07/27/2020    ECHOCARDIOGRAM REPORT   Patient Name:   ZAMIRE WHITEHURST Date of Exam: 07/27/2020 Medical Rec #:  [id number]        Height:       69.0 in Accession #:    [id number]       Weight:       255.0 lb Date of Birth:  1961/06/21        BSA:          2.291 m Patient Age:    9 years         BP:           157/81 mmHg Patient Gender: M                HR:           114 bpm. Exam Location:  Inpatient Procedure: 2D Echo, Cardiac Doppler, Color Doppler and Intracardiac            Opacification Agent Indications:    I50.33 Acute on chronic diastolic (congestive) heart failure  History:        Patient has no prior history of Echocardiogram examinations.                 Signs/Symptoms:Dyspnea; Risk Factors:Hypertension. CKD.  Sonographer:    Jonelle Sidle Dance Referring Phys: [id number] Caren Griffins  Sonographer Comments: Suboptimal parasternal window and patient is  morbidly obese. IMPRESSIONS  1. Left ventricular ejection fraction, by estimation, is 60 to 65%. The  left ventricle has normal function. Left ventricular endocardial border not optimally defined to evaluate regional wall motion. Left ventricular diastolic parameters are indeterminate.  2. Right ventricular systolic function was not well visualized. The right ventricular size is not well visualized.  3. Left atrial size was moderately dilated.  4. The mitral valve was not well visualized. No evidence of mitral valve regurgitation. No evidence of mitral stenosis.  5. The aortic valve was not well visualized. Aortic valve regurgitation is not visualized. No aortic stenosis is present.  6. The inferior vena cava is normal in size with greater than 50% respiratory variability, suggesting right atrial pressure of 3 mmHg. FINDINGS  Left Ventricle: Left ventricular ejection fraction, by estimation, is 60 to 65%. The left ventricle has normal function. Left ventricular endocardial border not optimally defined to evaluate regional wall motion. The left ventricular internal cavity size was normal in size. There is no left ventricular hypertrophy. Left ventricular diastolic parameters are indeterminate. Right Ventricle: Grossly RV appears normal in size and function. The right ventricular size is not well visualized. Right vetricular wall thickness was not well visualized. Right ventricular systolic function was not well visualized. Left Atrium: Left atrial size was moderately dilated. Right Atrium: Right atrial size was not well visualized. Pericardium: There is no evidence of pericardial effusion. Mitral Valve: The mitral valve was not well visualized. No evidence of mitral valve regurgitation. No evidence of mitral valve stenosis. Tricuspid Valve: The tricuspid valve is not well visualized. Tricuspid valve regurgitation is not demonstrated. No evidence of tricuspid stenosis. Aortic Valve: The aortic valve was not well visualized. Aortic valve regurgitation is not visualized. No aortic stenosis is present. Aortic valve mean  gradient measures 4.2 mmHg. Aortic valve peak gradient measures 8.1 mmHg. Aortic valve area, by VTI measures 2.58 cm. Pulmonic Valve: The pulmonic valve was not well visualized. Pulmonic valve regurgitation is not visualized. No evidence of pulmonic stenosis. Aorta: The aortic root was not well visualized. Pulmonary Artery: Indeterminant PASP, inadequate TR jet. Venous: The inferior vena cava is normal in size with greater than 50% respiratory variability, suggesting right atrial pressure of 3 mmHg. IAS/Shunts: No atrial level shunt detected by color flow Doppler.  LEFT VENTRICLE PLAX 2D LVIDd:         4.20 cm LVIDs:         3.60 cm LV PW:         1.10 cm LV IVS:        0.90 cm LVOT diam:     2.00 cm LV SV:         58 LV SV Index:   25 LVOT Area:     3.14 cm  RIGHT VENTRICLE          IVC RV Basal diam:  2.40 cm  IVC diam: 2.20 cm TAPSE (M-mode): 2.1 cm LEFT ATRIUM              Index       RIGHT ATRIUM           Index LA diam:        4.60 cm  2.01 cm/m  RA Area:     24.60 cm LA Vol (A2C):   109.0 ml 47.58 ml/m RA Volume:   68.40 ml  29.86 ml/m LA Vol (A4C):   82.4 ml  35.97 ml/m LA Biplane Vol: 100.0 ml 43.65 ml/m  AORTIC VALVE  AV Area (Vmax):    2.47 cm AV Area (Vmean):   2.26 cm AV Area (VTI):     2.58 cm AV Vmax:           142.37 cm/s AV Vmean:          98.965 cm/s AV VTI:            0.223 m AV Peak Grad:      8.1 mmHg AV Mean Grad:      4.2 mmHg LVOT Vmax:         112.09 cm/s LVOT Vmean:        71.096 cm/s LVOT VTI:          0.183 m LVOT/AV VTI ratio: 0.82  AORTA Ao Asc diam: 3.30 cm MITRAL VALVE MV Area (PHT): 4.07 cm     SHUNTS MV Decel Time: 187 msec     Systemic VTI:  0.18 m MV E velocity: 106.50 cm/s  Systemic Diam: 2.00 cm Carlyle Dolly MD Electronically signed by Carlyle Dolly MD Signature Date/Time: 07/27/2020/4:03:17 PM    Final    DG HIP OPERATIVE UNILAT W OR W/O PELVIS LEFT  Result Date: 07/27/2020 CLINICAL DATA:  Post left hip reduction. EXAM: OPERATIVE LEFT HIP (WITH PELVIS IF  PERFORMED) 2 VIEWS TECHNIQUE: Fluoroscopic spot image(s) were submitted for interpretation post-operatively. COMPARISON:  Left hip radiographs-07/26/2020 FINDINGS: Two spot fluoroscopic images of the left hip are provided for review and demonstrate apparent reduction of previously identified dislocated left total hip prosthesis. IMPRESSION: Post reduction of left total hip prosthesis without apparent complication. Electronically Signed   By: Sandi Mariscal M.D.   On: 07/27/2020 09:29   DG Hip Unilat W or Wo Pelvis 2-3 Views Left  Result Date: 07/26/2020 CLINICAL DATA:  59 year old male with a history of fall EXAM: DG HIP (WITH OR WITHOUT PELVIS) 2-3V LEFT COMPARISON:  CT 04/29/2020 FINDINGS: Posterior dislocation of the left hip arthroplasty. No fracture identified in the proximal femur. No fracture identified at the pelvis. Osteopenia. IMPRESSION: Posterior dislocation of the left hip arthroplasty components Electronically Signed   By: Corrie Mckusick D.O.   On: 07/26/2020 14:03    Assessment/Plan  1. Atrial flutter with rapid ventricular response (HCC) -  Rate-controlled, continue Metoprolol tartrate for rate control and Eliquis for anticoagulation  2. Anemia of chronic disease Lab Results  Component Value Date   HGB 8.2 (A) 08/08/2020   -  Stable, continue FeSO4  3. Chronic kidney disease, stage 5 (HCC) Lab Results  Component Value Date   NA 138 08/08/2020   K 4.2 08/08/2020   CO2 18 08/08/2020   GLUCOSE 119 (H) 08/01/2020   BUN 115 (A) 08/08/2020   CREATININE 4.8 (A) 08/08/2020   CALCIUM 11.9 (A) 08/08/2020   GFRNONAA 15 08/08/2020   GFRAA 15 08/08/2020   -  Continue Sodium bicarbonate  4. Closed dislocation of left hip, subsequent encounter -  S/P reduction on 07/27/20 -  Continue PT and OT, for therapeutic strengthening exercises -  Fall precautions     Family/ staff Communication:  Discussed plan of care with resident and charge nurse.  Labs/tests ordered:  None  Goals  of care:   Short-term care    Durenda Age, DNP, MSN, FNP-BC [facility] [phone] (Monday-Friday 8:00 a.m. - 5:00 p.m.) [phone] (after hours)

## 2020-08-18 ENCOUNTER — Encounter: Payer: Self-pay | Admitting: Adult Health

## 2020-08-18 ENCOUNTER — Non-Acute Institutional Stay (SKILLED_NURSING_FACILITY): Payer: 59 | Admitting: Adult Health

## 2020-08-18 DIAGNOSIS — N185 Chronic kidney disease, stage 5: Secondary | ICD-10-CM

## 2020-08-18 DIAGNOSIS — M1A9XX0 Chronic gout, unspecified, without tophus (tophi): Secondary | ICD-10-CM

## 2020-08-18 DIAGNOSIS — R600 Localized edema: Secondary | ICD-10-CM | POA: Diagnosis not present

## 2020-08-18 DIAGNOSIS — D638 Anemia in other chronic diseases classified elsewhere: Secondary | ICD-10-CM

## 2020-08-18 DIAGNOSIS — S73005D Unspecified dislocation of left hip, subsequent encounter: Secondary | ICD-10-CM

## 2020-08-18 DIAGNOSIS — I4892 Unspecified atrial flutter: Secondary | ICD-10-CM | POA: Diagnosis not present

## 2020-08-18 MED ORDER — SODIUM BICARBONATE 650 MG PO TABS: 650.0000 mg | ORAL_TABLET | Freq: Three times a day (TID) | ORAL | 0 refills | Status: DC

## 2020-08-18 MED ORDER — VITAMIN D3 25 MCG (1000 UNIT) PO TABS
1000.0000 [IU] | ORAL_TABLET | Freq: Every day | ORAL | 0 refills | Status: DC
Start: 2020-08-18 — End: 2020-10-16

## 2020-08-18 MED ORDER — FUROSEMIDE 40 MG PO TABS: 40.0000 mg | ORAL_TABLET | Freq: Two times a day (BID) | ORAL | 0 refills | Status: DC

## 2020-08-18 MED ORDER — METOPROLOL TARTRATE 100 MG PO TABS: 100.0000 mg | ORAL_TABLET | Freq: Two times a day (BID) | ORAL | 0 refills | Status: DC

## 2020-08-18 MED ORDER — APIXABAN 5 MG PO TABS: 5.0000 mg | ORAL_TABLET | Freq: Two times a day (BID) | ORAL | 0 refills | Status: DC

## 2020-08-18 MED ORDER — ALLOPURINOL 100 MG PO TABS: 50.0000 mg | ORAL_TABLET | ORAL | 0 refills | Status: DC

## 2020-08-18 MED ORDER — FERROUS SULFATE 325 (65 FE) MG PO TABS: 325.0000 mg | ORAL_TABLET | Freq: Every day | ORAL | 0 refills | Status: AC

## 2020-08-18 NOTE — Progress Notes (Signed)
Location:  [facility] Room Number: 212-A Place of Service:  SNF (31) Provider:  Durenda Age, DNP, FNP-BC  Patient Care Team: Delrae Rend, MD as PCP - General (Internal Medicine) Jamal Maes, MD as Consulting Physician (Nephrology)  Extended Emergency Contact Information Primary Emergency Contact: [place] Phone: [phone] Relation: Brother  Code Status:  Full Code  Goals of care: Advanced Directive information Advanced Directives 08/14/2020  Does Patient Have a Medical Advance Directive? Yes  Does patient want to make changes to medical advance directive? No - Patient declined  Would patient like information on creating a medical advance directive? No - Patient declined  Pre-existing out of facility DNR order (yellow form or pink MOST form) -     Chief Complaint  Patient presents with  . Discharge Note    Patient is seen for discharge from SNF    HPI:  Pt is a 59 y.o. male seen today for discharge home on 08/19/20 with Home health PT and OT.  He was admitted to [facility] on 08/01/20 post [facility] hospitalization 07/26/2020 to 08/01/2020 for closed left hip dislocation.  I daily for this hospitalization, he slipped in some water and fell onto his left hip.  He was unable to get up and laid for about 36 hours until such time he was able to crawl to a phone and called EMS and was brought to the ED.  Imaging showed left hip dislocation.  Orthopedic was consulted and closed reduction was performed on 07/27/2020.  He has had a cough since lying on the floor.  He was a started empirically on Rocephin and doxycycline and has completed his course.  He had a new onset atrial flutter.  Heart rate has been from 100-110.  Cardiology was consulted and was started on beta-blocker and Eliquis.    Patient was admitted to this facility for short-term rehabilitation after the patient's recent  hospitalization.  Patient has completed SNF rehabilitation and therapy has cleared the patient for discharge.   Past Medical History:  Diagnosis Date  . Anemia    labs today indicate (09/27/11)  . Arthritis    right knee  . Bleeding ulcer    hx of, required 6 units blood  . Blood transfusion    6 units with ulcer repair surgery  . Chronic kidney disease    stage IV, has fistula, never started diaylsis  . Complication of anesthesia    little slow to wake up with last surgery  . Dyspnea    with exertion  . Gout   . Hypertension   . Osteoporosis   . Pneumonia    Past Surgical History:  Procedure Laterality Date  . AV FISTULA PLACEMENT Left 09/07/2016   Procedure: ARTERIOVENOUS (AV) FISTULA CREATION LEFT ARM;  Surgeon: Angelia Mould, MD;  Location: [place];  Service: Vascular;  Laterality: Left;  . COLONOSCOPY    . EXTERNAL EAR SURGERY    . HIP CLOSED REDUCTION Left 07/27/2020   Procedure: CLOSED REDUCTION HIP;  Surgeon: Marchia Bond, MD;  Location: [place];  Service: Orthopedics;  Laterality: Left;  . HIP PINNING  11/2010  . JOINT REPLACEMENT    . OTHER SURGICAL HISTORY  ~ 08/1998   repair bleeding ulcer  . SHOULDER ARTHROSCOPY WITH ROTATOR CUFF REPAIR AND SUBACROMIAL DECOMPRESSION Right 05/26/2017   Procedure: SHOULDER ARTHROSCOPY WITH ROTATOR CUFF REPAIR AND SUBACROMIAL DECOMPRESSION;  Surgeon: Tania Ade, MD;  Location: [place];  Service:  Orthopedics;  Laterality: Right;  SHOULDER ARTHROSCOPY WITH ROTATOR CUFF REPAIR AND SUBACROMIAL DECOMPRESSION  . TOTAL HIP ARTHROPLASTY  10/06/11   left  . TOTAL HIP ARTHROPLASTY  10/06/2011   Procedure: TOTAL HIP ARTHROPLASTY;  Surgeon: Kerin Salen;  Location: [place];  Service: Orthopedics;  Laterality: Left;    Allergies  Allergen Reactions  . Penicillins Swelling, Rash and Other (See Comments)    Arms and eyes swell & skin breaks out  Has patient had a PCN reaction causing immediate rash, facial/tongue/throat swelling, SOB  or lightheadedness with hypotension: Yes Has patient had a PCN reaction causing severe rash involving mucus membranes or skin necrosis: Yes Has patient had a PCN reaction that required hospitalization: No Has patient had a PCN reaction occurring within the last 10 years: No If all of the above answers are "NO", then may proceed with Cephalosporin use.     Outpatient Encounter Medications as of 08/18/2020  Medication Sig  . acetaminophen (TYLENOL) 325 MG tablet Take 325 mg by mouth every 6 (six) hours as needed for mild pain or headache.   . allopurinol (ZYLOPRIM) 100 MG tablet Take 0.5 tablets (50 mg total) by mouth every Monday, Wednesday, and Friday.  Marland Kitchen apixaban (ELIQUIS) 5 MG TABS tablet Take 1 tablet (5 mg total) by mouth 2 (two) times daily.  . cholecalciferol (VITAMIN D) 25 MCG (1000 UNIT) tablet Take 1 tablet (1,000 Units total) by mouth daily.  . ferrous sulfate 325 (65 FE) MG tablet Take 1 tablet (325 mg total) by mouth daily with breakfast.  . furosemide (LASIX) 40 MG tablet Take 1 tablet (40 mg total) by mouth 2 (two) times daily.  . metoprolol tartrate (LOPRESSOR) 100 MG tablet Take 1 tablet (100 mg total) by mouth 2 (two) times daily.  . Multiple Vitamin (MULTIVITAMIN WITH MINERALS) TABS tablet Take 1 tablet by mouth daily after breakfast.  . sodium bicarbonate 650 MG tablet Take 1 tablet (650 mg total) by mouth 3 (three) times daily.  . [DISCONTINUED] allopurinol (ZYLOPRIM) 100 MG tablet Take 50 mg by mouth every Monday, Wednesday, and Friday.  . [DISCONTINUED] apixaban (ELIQUIS) 5 MG TABS tablet Take 1 tablet (5 mg total) by mouth 2 (two) times daily.  . [DISCONTINUED] cholecalciferol (VITAMIN D) 25 MCG (1000 UNIT) tablet Take 1,000 Units by mouth daily.  . [DISCONTINUED] ferrous sulfate 325 (65 FE) MG tablet Take 325 mg by mouth daily with breakfast.  . [DISCONTINUED] furosemide (LASIX) 40 MG tablet Take 1 tablet (40 mg total) by mouth 2 (two) times daily.  . [DISCONTINUED]  metoprolol tartrate (LOPRESSOR) 100 MG tablet Take 1 tablet (100 mg total) by mouth 2 (two) times daily.  . [DISCONTINUED] sodium bicarbonate 650 MG tablet Take 650 mg by mouth 3 (three) times daily.    No facility-administered encounter medications on file as of 08/18/2020.    Review of Systems  GENERAL: No change in appetite, no fatigue, no weight changes, no fever, chills or weakness MOUTH and THROAT: Denies oral discomfort, gingival pain or bleeding RESPIRATORY: no cough, SOB, DOE, wheezing, hemoptysis CARDIAC: No chest pain or palpitations GI: No abdominal pain, diarrhea, constipation, heart burn, nausea or vomiting GU: Denies dysuria, frequency, hematuria, incontinence, or discharge NEUROLOGICAL: Denies dizziness, syncope, numbness, or headache PSYCHIATRIC: Denies feelings of depression or anxiety. No report of hallucinations, insomnia, paranoia, or agitation   There is no immunization history on file for this patient. Pertinent  Health Maintenance Due  Topic Date Due  . FOOT EXAM  Never  done  . OPHTHALMOLOGY EXAM  Never done  . URINE MICROALBUMIN  Never done  . COLONOSCOPY  Never done  . HEMOGLOBIN A1C  04/04/2012  . INFLUENZA VACCINE  Never done   No flowsheet data found.   Vitals:   08/18/20 1529  BP: 98/66  Pulse: 71  Resp: 18  Temp: (!) 97.4 F (36.3 C)  TempSrc: Oral  Weight: 236 lb 3.2 oz (107.1 kg)  Height: 5\' 9"  (1.753 m)   Body mass index is 34.88 kg/m.  Physical Exam  GENERAL APPEARANCE: Well nourished. In no acute distress.  Obese SKIN:  Skin is warm and dry.  MOUTH and THROAT: Lips are without lesions. Oral mucosa is moist and without lesions. Tongue is normal in shape, size, and color and without lesions RESPIRATORY: Breathing is even & unlabored, BS CTAB CARDIAC: RRR, no murmur,no extra heart sounds, no edema GI: Abdomen soft, normal BS, no masses, no tenderness EXTREMITIES:  Able to move X 4 extremities NEUROLOGICAL: There is no tremor.  Speech is clear. Alert and oriented X 3. PSYCHIATRIC:  Affect and behavior are appropriate  Labs reviewed: Recent Labs    04/30/20 0431 04/30/20 0431 05/01/20 0453 05/01/20 0453 05/02/20 0538 05/03/20 0918 07/26/20 1409 07/28/20 0307 07/30/20 1422 07/30/20 1422 07/31/20 1532 07/31/20 1532 08/01/20 0836 08/07/20 0000 08/08/20 0000  NA 140   < > 141   < > 140   < > 139   < > 138   < > 139   < > 138 138 138  K 3.9   < > 4.0   < > 3.9   < > 4.5   < > 4.4   < > 5.0   < > 4.6 4.1 4.2  CL 105   < > 110   < > 109   < > 105   < > 107   < > 105   < > 106 105 107  CO2 20*   < > 20*   < > 18*   < > 16*   < > 18*   < > 17*   < > 14* 16 18  GLUCOSE 84   < > 91   < > 77   < > 109*   < > 112*  --  100*  --  119*  --   --   BUN 89*   < > 80*   < > 74*   < > 112*   < > 150*   < > 160*   < > 156* 121* 115*  CREATININE 4.73*   < > 4.48*   < > 4.39*   < > 4.93*   < > 5.25*   < > 4.88*   < > 4.53* 4.4* 4.8*  CALCIUM 10.9*   < > 10.6*   < > 10.5*   < > 12.3*   < > 11.2*   < > 11.1*   < > 11.5* 11.3* 11.9*  MG  --   --   --   --   --   --  2.0  --   --   --   --   --   --   --   --   PHOS 6.9*  --  5.7*  --  5.2*  --   --   --   --   --   --   --   --   --   --    < > =  values in this interval not displayed.   Recent Labs    04/29/20 0826 07/26/20 1409 07/28/20 0307  AST 20 45* 37  ALT 14 15 17   ALKPHOS 61 73 63  BILITOT 0.8 1.0 0.6  PROT 8.5* 8.7* 7.7  ALBUMIN 3.1* 3.2* 2.9*   Recent Labs    07/26/20 1409 07/28/20 0307 07/30/20 1422 07/30/20 1422 07/31/20 1532 07/31/20 1532 08/01/20 0836 08/07/20 0000 08/08/20 0000  WBC 12.0*   < > 13.7*   < > 13.6*   < > 14.0* 7.1 6.4  NEUTROABS 9.8*  --   --   --   --   --   --  5 4  HGB 9.1*   < > 8.3*   < > 8.6*   < > 8.9* 8.1* 8.2*  HCT 28.1*   < > 26.4*   < > 27.4*   < > 28.6* 25* 26*  MCV 102.6*   < > 106.0*  --  105.0*  --  106.3*  --   --   PLT 202   < > 176   < > 167   < > 173 179 177   < > = values in this interval not displayed.    Lab Results  Component Value Date   TSH 1.174 07/26/2020   Lab Results  Component Value Date   HGBA1C 5.4 10/06/2011   No results found for: CHOL, HDL, LDLCALC, LDLDIRECT, TRIG, CHOLHDL  Significant Diagnostic Results in last 30 days:  DG Chest 2 View  Result Date: 07/27/2020 CLINICAL DATA:  Short of breath. EXAM: CHEST - 2 VIEW COMPARISON:  07/26/2020 FINDINGS: Stable cardiac enlargement. Persistent nodular opacity within the lateral right lung base, along the right border, is noted which may reflect atelectasis or airspace disease. Mild platelike atelectasis in the left midlung is new from previous exam. No findings of pleural effusion or interstitial edema. IMPRESSION: Persistent nodular opacity within the lateral right lung base which may reflect atelectasis or airspace disease. Continued follow-up imaging is advised to ensure resolution. If this does not improve thin more definitive characterization with CT of the chest would be recommended to assess for underlying lung nodule. Electronically Signed   By: Kerby Moors M.D.   On: 07/27/2020 08:57   DG Lumbar Spine Complete  Result Date: 07/26/2020 CLINICAL DATA:  Pain after fall.  Hip deformity. EXAM: LUMBAR SPINE - COMPLETE 4+ VIEW COMPARISON:  None. FINDINGS: Patient is status post left hip replacement. On limited images, there is a dislocation of the femoral component versus the acetabular component. Pelvic bones otherwise normal. Multilevel degenerative disc disease. No fracture or malalignment. Lower lumbar facet degenerative changes noted. IMPRESSION: 1. Left hip dislocation.  See the dedicated left hip films. 2. Degenerative disc disease and lower lumbar facet degenerative changes in the lumbar spine. 3. No fracture or traumatic malalignment. Electronically Signed   By: Dorise Bullion III M.D   On: 07/26/2020 14:02   CT Head Wo Contrast  Result Date: 07/26/2020 CLINICAL DATA:  Status post fall. EXAM: CT HEAD WITHOUT CONTRAST  TECHNIQUE: Contiguous axial images were obtained from the base of the skull through the vertex without intravenous contrast. COMPARISON:  None. FINDINGS: Brain: There is mild cerebral atrophy with widening of the extra-axial spaces and ventricular dilatation. There are areas of decreased attenuation within the white matter tracts of the supratentorial brain, consistent with microvascular disease changes. Vascular: No hyperdense vessel or unexpected calcification. Skull: Normal. Negative for fracture or focal lesion. Sinuses/Orbits:  No acute finding. Other: Mild posterolateral scalp soft tissue swelling is seen along the vertex on the right. IMPRESSION: 1. Mild cerebral atrophy. 2. No acute intracranial abnormality. Electronically Signed   By: Virgina Norfolk M.D.   On: 07/26/2020 15:23   CT Cervical Spine Wo Contrast  Result Date: 07/26/2020 CLINICAL DATA:  Status post fall. EXAM: CT CERVICAL SPINE WITHOUT CONTRAST TECHNIQUE: Multidetector CT imaging of the cervical spine was performed without intravenous contrast. Multiplanar CT image reconstructions were also generated. COMPARISON:  None. FINDINGS: Alignment: Normal. Skull base and vertebrae: No acute fracture. No primary bone lesion or focal pathologic process. Chronic changes are seen along the anterior arch of the C1 vertebral body. Soft tissues and spinal canal: No prevertebral fluid or swelling. No visible canal hematoma. Disc levels: Moderate severity endplate sclerosis is seen at the level of C6-C7. There is moderate severity narrowing of the anterior atlantoaxial articulation. Marked severity intervertebral disc space narrowing is seen at this level. Mild intervertebral disc space narrowing is noted at the level of C7-T1. Mild, bilateral multilevel facet joint hypertrophy is noted. Upper chest: Negative. Other: None. IMPRESSION: 1. No acute osseous abnormality. 2. Marked severity degenerative disc disease at the level of C6-C7. Electronically Signed    By: Virgina Norfolk M.D.   On: 07/26/2020 15:29   DG Chest Portable 1 View  Result Date: 07/26/2020 CLINICAL DATA:  Low-grade fever and fall. EXAM: PORTABLE CHEST 1 VIEW COMPARISON:  10/12/2011 FINDINGS: Lordotic technique is demonstrated. Patient is rotated to the right. Lungs are adequately inflated with opacification over the right midlung which may be due to atelectasis or infection. No effusion. Stable cardiomegaly. Remainder of the exam is unchanged. IMPRESSION: 1. Opacification over the right midlung which may be due to atelectasis or infection. 2. Stable cardiomegaly. Electronically Signed   By: Marin Olp M.D.   On: 07/26/2020 14:30   DG C-Arm 1-60 Min  Result Date: 07/27/2020 CLINICAL DATA:  Post left hip reduction. EXAM: OPERATIVE LEFT HIP (WITH PELVIS IF PERFORMED) 2 VIEWS TECHNIQUE: Fluoroscopic spot image(s) were submitted for interpretation post-operatively. COMPARISON:  Left hip radiographs-07/26/2020 FINDINGS: Two spot fluoroscopic images of the left hip are provided for review and demonstrate apparent reduction of previously identified dislocated left total hip prosthesis. IMPRESSION: Post reduction of left total hip prosthesis without apparent complication. Electronically Signed   By: Sandi Mariscal M.D.   On: 07/27/2020 09:29   ECHOCARDIOGRAM COMPLETE  Result Date: 07/27/2020    ECHOCARDIOGRAM REPORT   Patient Name:   Mark [name] Date of Exam: 07/27/2020 Medical Rec #:  [id number]        Height:       69.0 in Accession #:    [id number]       Weight:       255.0 lb Date of Birth:  1961/05/02        BSA:          2.291 m Patient Age:    48 years         BP:           157/81 mmHg Patient Gender: M                HR:           114 bpm. Exam Location:  Inpatient Procedure: 2D Echo, Cardiac Doppler, Color Doppler and Intracardiac            Opacification Agent Indications:    I50.33 Acute on chronic diastolic (congestive) heart  failure  History:        Patient has no prior history of  Echocardiogram examinations.                 Signs/Symptoms:Dyspnea; Risk Factors:Hypertension. CKD.  Sonographer:    Jonelle Sidle Dance Referring Phys: [id number] Caren Griffins  Sonographer Comments: Suboptimal parasternal window and patient is morbidly obese. IMPRESSIONS  1. Left ventricular ejection fraction, by estimation, is 60 to 65%. The left ventricle has normal function. Left ventricular endocardial border not optimally defined to evaluate regional wall motion. Left ventricular diastolic parameters are indeterminate.  2. Right ventricular systolic function was not well visualized. The right ventricular size is not well visualized.  3. Left atrial size was moderately dilated.  4. The mitral valve was not well visualized. No evidence of mitral valve regurgitation. No evidence of mitral stenosis.  5. The aortic valve was not well visualized. Aortic valve regurgitation is not visualized. No aortic stenosis is present.  6. The inferior vena cava is normal in size with greater than 50% respiratory variability, suggesting right atrial pressure of 3 mmHg. FINDINGS  Left Ventricle: Left ventricular ejection fraction, by estimation, is 60 to 65%. The left ventricle has normal function. Left ventricular endocardial border not optimally defined to evaluate regional wall motion. The left ventricular internal cavity size was normal in size. There is no left ventricular hypertrophy. Left ventricular diastolic parameters are indeterminate. Right Ventricle: Grossly RV appears normal in size and function. The right ventricular size is not well visualized. Right vetricular wall thickness was not well visualized. Right ventricular systolic function was not well visualized. Left Atrium: Left atrial size was moderately dilated. Right Atrium: Right atrial size was not well visualized. Pericardium: There is no evidence of pericardial effusion. Mitral Valve: The mitral valve was not well visualized. No evidence of mitral valve  regurgitation. No evidence of mitral valve stenosis. Tricuspid Valve: The tricuspid valve is not well visualized. Tricuspid valve regurgitation is not demonstrated. No evidence of tricuspid stenosis. Aortic Valve: The aortic valve was not well visualized. Aortic valve regurgitation is not visualized. No aortic stenosis is present. Aortic valve mean gradient measures 4.2 mmHg. Aortic valve peak gradient measures 8.1 mmHg. Aortic valve area, by VTI measures 2.58 cm. Pulmonic Valve: The pulmonic valve was not well visualized. Pulmonic valve regurgitation is not visualized. No evidence of pulmonic stenosis. Aorta: The aortic root was not well visualized. Pulmonary Artery: Indeterminant PASP, inadequate TR jet. Venous: The inferior vena cava is normal in size with greater than 50% respiratory variability, suggesting right atrial pressure of 3 mmHg. IAS/Shunts: No atrial level shunt detected by color flow Doppler.  LEFT VENTRICLE PLAX 2D LVIDd:         4.20 cm LVIDs:         3.60 cm LV PW:         1.10 cm LV IVS:        0.90 cm LVOT diam:     2.00 cm LV SV:         58 LV SV Index:   25 LVOT Area:     3.14 cm  RIGHT VENTRICLE          IVC RV Basal diam:  2.40 cm  IVC diam: 2.20 cm TAPSE (M-mode): 2.1 cm LEFT ATRIUM              Index       RIGHT ATRIUM           Index LA  diam:        4.60 cm  2.01 cm/m  RA Area:     24.60 cm LA Vol (A2C):   109.0 ml 47.58 ml/m RA Volume:   68.40 ml  29.86 ml/m LA Vol (A4C):   82.4 ml  35.97 ml/m LA Biplane Vol: 100.0 ml 43.65 ml/m  AORTIC VALVE AV Area (Vmax):    2.47 cm AV Area (Vmean):   2.26 cm AV Area (VTI):     2.58 cm AV Vmax:           142.37 cm/s AV Vmean:          98.965 cm/s AV VTI:            0.223 m AV Peak Grad:      8.1 mmHg AV Mean Grad:      4.2 mmHg LVOT Vmax:         112.09 cm/s LVOT Vmean:        71.096 cm/s LVOT VTI:          0.183 m LVOT/AV VTI ratio: 0.82  AORTA Ao Asc diam: 3.30 cm MITRAL VALVE MV Area (PHT): 4.07 cm     SHUNTS MV Decel Time: 187 msec      Systemic VTI:  0.18 m MV E velocity: 106.50 cm/s  Systemic Diam: 2.00 cm Carlyle Dolly MD Electronically signed by Carlyle Dolly MD Signature Date/Time: 07/27/2020/4:03:17 PM    Final    DG HIP OPERATIVE UNILAT W OR W/O PELVIS LEFT  Result Date: 07/27/2020 CLINICAL DATA:  Post left hip reduction. EXAM: OPERATIVE LEFT HIP (WITH PELVIS IF PERFORMED) 2 VIEWS TECHNIQUE: Fluoroscopic spot image(s) were submitted for interpretation post-operatively. COMPARISON:  Left hip radiographs-07/26/2020 FINDINGS: Two spot fluoroscopic images of the left hip are provided for review and demonstrate apparent reduction of previously identified dislocated left total hip prosthesis. IMPRESSION: Post reduction of left total hip prosthesis without apparent complication. Electronically Signed   By: Sandi Mariscal M.D.   On: 07/27/2020 09:29   DG Hip Unilat W or Wo Pelvis 2-3 Views Left  Result Date: 07/26/2020 CLINICAL DATA:  59 year old male with a history of fall EXAM: DG HIP (WITH OR WITHOUT PELVIS) 2-3V LEFT COMPARISON:  CT 04/29/2020 FINDINGS: Posterior dislocation of the left hip arthroplasty. No fracture identified in the proximal femur. No fracture identified at the pelvis. Osteopenia. IMPRESSION: Posterior dislocation of the left hip arthroplasty components Electronically Signed   By: Corrie Mckusick D.O.   On: 07/26/2020 14:03    Assessment/Plan  1. Closed dislocation of left hip, subsequent encounter -  S/P reduction on 07/27/2020 -  For home health PT and OT, for therapeutic strengthening exercises -   Follow-up with orthopedic surgery, Dr. [name]  2. Atrial flutter with rapid ventricular response (HCC) -  HRs ranging from 68 to 98 - apixaban (ELIQUIS) 5 MG TABS tablet; Take 1 tablet (5 mg total) by mouth 2 (two) times daily.  Dispense: 30 tablet; Refill: 0 - metoprolol tartrate (LOPRESSOR) 100 MG tablet; Take 1 tablet (100 mg total) by mouth 2 (two) times daily.  Dispense: 60 tablet; Refill: 0  3.  Chronic kidney disease, stage 5 (HCC) Lab Results  Component Value Date   NA 138 08/08/2020   K 4.2 08/08/2020   CO2 18 08/08/2020   GLUCOSE 119 (H) 08/01/2020   BUN 115 (A) 08/08/2020   CREATININE 4.8 (A) 08/08/2020   CALCIUM 11.9 (A) 08/08/2020   GFRNONAA 15 08/08/2020   GFRAA 15 08/08/2020   -  sodium bicarbonate 650 MG tablet; Take 1 tablet (650 mg total) by mouth 3 (three) times daily.  Dispense: 90 tablet; Refill: 0 -Follow-up with nephrology  4. Bilateral lower extremity edema -  No edema at present - furosemide (LASIX) 40 MG tablet; Take 1 tablet (40 mg total) by mouth 2 (two) times daily.  Dispense: 60 tablet; Refill: 0  5. Anemia of chronic disease Lab Results  Component Value Date   HGB 8.2 (A) 08/08/2020   - ferrous sulfate 325 (65 FE) MG tablet; Take 1 tablet (325 mg total) by mouth daily with breakfast.  Dispense: 30 tablet; Refill: 0  6. Chronic gout without tophus, unspecified cause, unspecified site - allopurinol (ZYLOPRIM) 100 MG tablet; Take 0.5 tablets (50 mg total) by mouth every Monday, Wednesday, and Friday.  Dispense: 6 tablet; Refill: 0     I have filled out patient's discharge paperwork and written prescriptions.  Patient will have home health PT and OT.  DME provided:  None  Total discharge time: Greater than 30 minutes Greater than 50% was spent in counseling and coordination of care.  Discharge time involved coordination of the discharge process with social worker, nursing staff and therapy department. Medical justification for home health services verified.    Durenda Age, DNP, MSN, FNP-BC [facility] [phone] (Monday-Friday 8:00 a.m. - 5:00 p.m.) [phone] (after hours)

## 2020-09-11 ENCOUNTER — Emergency Department (HOSPITAL_COMMUNITY): Payer: 59

## 2020-09-11 ENCOUNTER — Other Ambulatory Visit: Payer: Self-pay

## 2020-09-11 ENCOUNTER — Emergency Department (HOSPITAL_COMMUNITY)
Admission: EM | Admit: 2020-09-11 | Discharge: 2020-09-11 | Disposition: A | Discharge status: Home / Self Care | Payer: 59 | Attending: Emergency Medicine | Admitting: Emergency Medicine

## 2020-09-11 DIAGNOSIS — F17210 Nicotine dependence, cigarettes, uncomplicated: Secondary | ICD-10-CM | POA: Diagnosis not present

## 2020-09-11 DIAGNOSIS — E1122 Type 2 diabetes mellitus with diabetic chronic kidney disease: Secondary | ICD-10-CM | POA: Diagnosis not present

## 2020-09-11 DIAGNOSIS — I129 Hypertensive chronic kidney disease with stage 1 through stage 4 chronic kidney disease, or unspecified chronic kidney disease: Secondary | ICD-10-CM | POA: Diagnosis not present

## 2020-09-11 DIAGNOSIS — Y792 Prosthetic and other implants, materials and accessory orthopedic devices associated with adverse incidents: Secondary | ICD-10-CM | POA: Insufficient documentation

## 2020-09-11 DIAGNOSIS — N184 Chronic kidney disease, stage 4 (severe): Secondary | ICD-10-CM | POA: Diagnosis not present

## 2020-09-11 DIAGNOSIS — T84021A Dislocation of internal left hip prosthesis, initial encounter: Secondary | ICD-10-CM | POA: Insufficient documentation

## 2020-09-11 DIAGNOSIS — S73015A Posterior dislocation of left hip, initial encounter: Secondary | ICD-10-CM

## 2020-09-11 DIAGNOSIS — W1839XA Other fall on same level, initial encounter: Secondary | ICD-10-CM | POA: Diagnosis not present

## 2020-09-11 DIAGNOSIS — W19XXXA Unspecified fall, initial encounter: Secondary | ICD-10-CM

## 2020-09-11 DIAGNOSIS — Z79899 Other long term (current) drug therapy: Secondary | ICD-10-CM | POA: Diagnosis not present

## 2020-09-11 DIAGNOSIS — Z96642 Presence of left artificial hip joint: Secondary | ICD-10-CM | POA: Diagnosis not present

## 2020-09-11 DIAGNOSIS — S79912A Unspecified injury of left hip, initial encounter: Secondary | ICD-10-CM | POA: Diagnosis present

## 2020-09-11 DIAGNOSIS — Z20822 Contact with and (suspected) exposure to covid-19: Secondary | ICD-10-CM | POA: Insufficient documentation

## 2020-09-11 LAB — CBC WITH DIFFERENTIAL/PLATELET
Abs Immature Granulocytes: 0.04 10*3/uL (ref 0.00–0.07)
Basophils Absolute: 0 10*3/uL (ref 0.0–0.1)
Basophils Relative: 0 %
Eosinophils Absolute: 0.1 10*3/uL (ref 0.0–0.5)
Eosinophils Relative: 1 %
HCT: 27.3 % — ABNORMAL LOW (ref 39.0–52.0)
Hemoglobin: 8.3 g/dL — ABNORMAL LOW (ref 13.0–17.0)
Immature Granulocytes: 1 %
Lymphocytes Relative: 15 %
Lymphs Abs: 1.1 10*3/uL (ref 0.7–4.0)
MCH: 32.7 pg (ref 26.0–34.0)
MCHC: 30.4 g/dL (ref 30.0–36.0)
MCV: 107.5 fL — ABNORMAL HIGH (ref 80.0–100.0)
Monocytes Absolute: 0.8 10*3/uL (ref 0.1–1.0)
Monocytes Relative: 10 %
Neutro Abs: 5.6 10*3/uL (ref 1.7–7.7)
Neutrophils Relative %: 73 %
Platelets: 162 10*3/uL (ref 150–400)
RBC: 2.54 MIL/uL — ABNORMAL LOW (ref 4.22–5.81)
RDW: 15.2 % (ref 11.5–15.5)
WBC: 7.5 10*3/uL (ref 4.0–10.5)
nRBC: 0 % (ref 0.0–0.2)

## 2020-09-11 LAB — RESPIRATORY PANEL BY RT PCR (FLU A&B, COVID)
Influenza A by PCR: NEGATIVE
Influenza B by PCR: NEGATIVE
SARS Coronavirus 2 by RT PCR: NEGATIVE

## 2020-09-11 LAB — CBG MONITORING, ED
Glucose-Capillary: 64 mg/dL — ABNORMAL LOW (ref 70–99)
Glucose-Capillary: 85 mg/dL (ref 70–99)

## 2020-09-11 LAB — BASIC METABOLIC PANEL
Anion gap: 16 — ABNORMAL HIGH (ref 5–15)
BUN: 107 mg/dL — ABNORMAL HIGH (ref 8–23)
CO2: 14 mmol/L — ABNORMAL LOW (ref 22–32)
Calcium: 11.1 mg/dL — ABNORMAL HIGH (ref 8.9–10.3)
Chloride: 109 mmol/L (ref 98–111)
Creatinine, Ser: 4.25 mg/dL — ABNORMAL HIGH (ref 0.61–1.24)
GFR, Estimated: 15 mL/min — ABNORMAL LOW (ref >60)
Glucose, Bld: 55 mg/dL — ABNORMAL LOW (ref 70–99)
Potassium: 5 mmol/L (ref 3.5–5.1)
Sodium: 139 mmol/L (ref 135–145)

## 2020-09-11 MED ORDER — DEXTROSE 50 % IV SOLN: 1.0000 | Freq: Once | INTRAVENOUS | Status: DC

## 2020-09-11 MED ORDER — PROPOFOL 10 MG/ML IV BOLUS
100.0000 mg | Freq: Once | INTRAVENOUS | Status: AC
  Administered 2020-09-11: 90 mg via INTRAVENOUS
  Filled 2020-09-11: qty 20

## 2020-09-11 MED ORDER — PROPOFOL 10 MG/ML IV BOLUS
INTRAVENOUS | Status: AC
  Filled 2020-09-11: qty 20

## 2020-09-11 MED ORDER — HYDROMORPHONE HCL 1 MG/ML IJ SOLN
1.0000 mg | Freq: Once | INTRAMUSCULAR | Status: AC
  Administered 2020-09-11: 1 mg via INTRAVENOUS
  Filled 2020-09-11: qty 1

## 2020-09-11 MED ORDER — METOPROLOL TARTRATE 25 MG PO TABS
100.0000 mg | ORAL_TABLET | Freq: Once | ORAL | Status: AC
  Administered 2020-09-11: 100 mg via ORAL
  Filled 2020-09-11: qty 4

## 2020-09-11 NOTE — ED Provider Notes (Signed)
[name]ERG[facility] DE[facility]MENT Provider Note   CSN: [id number] Arrival date & time: 09/11/20  1231     History Chief Complaint  Patient presents with  . Hip Pain    [name] is a 59 y.o. male.  HPI   59 year old male with a history of anemia, arthritis, bleeding ulcer, CKD, dyspnea, gout, hypertension, osteoporosis, who presents the emergency department today for evaluation after a fall. Patient states that he was standing when he felt his hip give out. He fell onto his bilateral knees. He denies any head trauma or LOC. He is mainly complaining of pain to the left hip. He denies any other associated symptoms. States he has been in his normal state of health recently not had any fevers, chest pain, cough, vomiting or diarrhea. He did not take his morning meds today.  States that he has a history of A. fib/flutter and was initially started on anticoagulation but states his doctor told him he didn't need to take it so he is no longer anticoagulated.  Past Medical History:  Diagnosis Date  . Anemia    labs today indicate (09/27/11)  . Arthritis    right knee  . Bleeding ulcer    hx of, required 6 units blood  . Blood transfusion    6 units with ulcer repair surgery  . Chronic kidney disease    stage IV, has fistula, never started diaylsis  . Complication of anesthesia    little slow to wake up with last surgery  . Dyspnea    with exertion  . Gout   . Hypertension   . Osteoporosis   . Pneumonia     Patient Active Problem List   Diagnosis Date Noted  . Atrial flutter with rapid ventricular response ([place]) 07/28/2020  . Sepsis ([place]) 07/26/2020  . Closed dislocation of left hip ([place]) 07/26/2020  . CKD (chronic kidney disease), stage IV ([place]) 07/26/2020  . Gout 07/26/2020  . Pneumonia 07/26/2020  . Diverticula of colon 04/29/2020  . Acute renal failure superimposed on chronic kidney disease ([place]) 04/29/2020  . Diverticulitis   . Sinus  tachycardia 10/21/2015  . DM (diabetes mellitus) ([place]) 10/09/2011  . Benign essential HTN 10/09/2011  . Post-traumatic osteoarthritis of left hip 10/06/2011    Past Surgical History:  Procedure Laterality Date  . AV FISTULA PLACEMENT Left 09/07/2016   Procedure: ARTERIOVENOUS (AV) FISTULA CREATION LEFT ARM;  Surgeon: Angelia Mould, MD;  Location: [facility];  Service: Vascular;  Laterality: Left;  . COLONOSCOPY    . EXTERNAL EAR SURGERY    . HIP CLOSED REDUCTION Left 07/27/2020   Procedure: CLOSED REDUCTION HIP;  Surgeon: Marchia Bond, MD;  Location: [facility];  Service: Orthopedics;  Laterality: Left;  . HIP PINNING  11/2010  . JOINT REPLACEMENT    . OTHER SURGICAL HISTORY  ~ 08/1998   repair bleeding ulcer  . SHOULDER ARTHROSCOPY WITH ROTATOR CUFF REPAIR AND SUBACROMIAL DECOMPRESSION Right 05/26/2017   Procedure: SHOULDER ARTHROSCOPY WITH ROTATOR CUFF REPAIR AND SUBACROMIAL DECOMPRESSION;  Surgeon: Tania Ade, MD;  Location: [facility];  Service: Orthopedics;  Laterality: Right;  SHOULDER ARTHROSCOPY WITH ROTATOR CUFF REPAIR AND SUBACROMIAL DECOMPRESSION  . TOTAL HIP ARTHROPLASTY  10/06/11   left  . TOTAL HIP ARTHROPLASTY  10/06/2011   Procedure: TOTAL HIP ARTHROPLASTY;  Surgeon: Kerin Salen;  Location: [facility];  Service: Orthopedics;  Laterality: Left;       Family History  Problem Relation Age of Onset  .  Cancer Mother   . Atrial fibrillation Mother   . Hypertension Mother   . Cancer Father     Social History   Tobacco Use  . Smoking status: Current Some Day Smoker    Packs/day: 0.25    Years: 32.00    Pack years: 8.00    Types: Cigarettes  . Smokeless tobacco: Former Systems developer    Types: Chew    Quit date: 81  . Tobacco comment: smoking cessation consult entered  Vaping Use  . Vaping Use: Never used  Substance Use Topics  . Alcohol use: Yes    Alcohol/week: 5.0 standard drinks    Types: 5 Cans of beer per week    Comment: occasional  . Drug use: No    Home  Medications Prior to Admission medications   Medication Sig Start Date End Date Taking? Authorizing Provider  acetaminophen (TYLENOL) 325 MG tablet Take 325 mg by mouth every 6 (six) hours as needed for mild pain or headache.     [provider]  allopurinol (ZYLOPRIM) 100 MG tablet Take 0.5 tablets (50 mg total) by mouth every Monday, Wednesday, and Friday. 08/18/20   Medina-Vargas, Monina C, NP  apixaban (ELIQUIS) 5 MG TABS tablet Take 1 tablet (5 mg total) by mouth 2 (two) times daily. 08/18/20   Medina-Vargas, Monina C, NP  cholecalciferol (VITAMIN D) 25 MCG (1000 UNIT) tablet Take 1 tablet (1,000 Units total) by mouth daily. 08/18/20   Medina-Vargas, Monina C, NP  ferrous sulfate 325 (65 FE) MG tablet Take 1 tablet (325 mg total) by mouth daily with breakfast. 08/18/20   Medina-Vargas, Monina C, NP  furosemide (LASIX) 40 MG tablet Take 1 tablet (40 mg total) by mouth 2 (two) times daily. 08/18/20   Medina-Vargas, Monina C, NP  metoprolol tartrate (LOPRESSOR) 100 MG tablet Take 1 tablet (100 mg total) by mouth 2 (two) times daily. 08/18/20   Medina-Vargas, Monina C, NP  Multiple Vitamin (MULTIVITAMIN WITH MINERALS) TABS tablet Take 1 tablet by mouth daily after breakfast.    [provider]  sodium bicarbonate 650 MG tablet Take 1 tablet (650 mg total) by mouth 3 (three) times daily. 08/18/20   Medina-Vargas, Monina C, NP    Allergies    Penicillins  Review of Systems   Review of Systems  Constitutional: Negative for fever.  HENT: Negative for ear pain and sore throat.   Eyes: Negative for visual disturbance.  Respiratory: Negative for cough and shortness of breath.   Cardiovascular: Negative for chest pain.  Gastrointestinal: Negative for abdominal pain, constipation, diarrhea, nausea and vomiting.  Genitourinary: Negative for dysuria and hematuria.  Musculoskeletal:       Left hip pain  Skin: Negative for rash.  Neurological:       No head trauma or loc  All  other systems reviewed and are negative.   Physical Exam Updated Vital Signs BP 103/81   Pulse (!) 109   Temp 98.4 F (36.9 C) (Oral)   Resp 18   Ht 5\' 9"  (1.753 m)   Wt 113.4 kg   SpO2 99%   BMI 36.92 kg/m   Physical Exam Vitals and nursing note reviewed.  Constitutional:      Appearance: He is well-developed.  HENT:     Head: Normocephalic and atraumatic.  Eyes:     Conjunctiva/sclera: Conjunctivae normal.  Cardiovascular:     Rate and Rhythm: Regular rhythm. Tachycardia present.     Heart sounds: Normal heart sounds. No murmur heard.  Pulmonary:     Effort: Pulmonary effort is normal. No respiratory distress.     Breath sounds: Normal breath sounds. No wheezing, rhonchi or rales.  Abdominal:     General: Bowel sounds are normal.     Palpations: Abdomen is soft.     Tenderness: There is no abdominal tenderness.  Musculoskeletal:     Cervical back: Neck supple.     Comments: TTP to the left hip, unable to lift LLE  Skin:    General: Skin is warm and dry.  Neurological:     Mental Status: He is alert.     ED Results / Procedures / Treatments   Labs (all labs ordered are listed, but only abnormal results are displayed) Labs Reviewed  CBC WITH DIFFERENTIAL/PLATELET - Abnormal; Notable for the following components:      Result Value   RBC 2.54 (*)    Hemoglobin 8.3 (*)    HCT 27.3 (*)    MCV 107.5 (*)    All other components within normal limits  BASIC METABOLIC PANEL - Abnormal; Notable for the following components:   CO2 14 (*)    Glucose, Bld 55 (*)    BUN 107 (*)    Creatinine, Ser 4.25 (*)    Calcium 11.1 (*)    GFR, Estimated 15 (*)    Anion gap 16 (*)    All other components within normal limits  CBG MONITORING, ED - Abnormal; Notable for the following components:   Glucose-Capillary 64 (*)    All other components within normal limits  RESPIRATORY PANEL BY RT PCR (FLU A&B, COVID)  CBG MONITORING, ED    EKG EKG  Interpretation  Date/Time:  Thursday September 11 2020 13:24:56 EDT Ventricular Rate:  116 PR Interval:    QRS Duration: 102 QT Interval:  332 QTC Calculation: 462 R Axis:   79 Text Interpretation: Atrial flutter with predominant 2:1 AV block similar to Sept 2021 Confirmed by Sherwood Gambler [phone]) on 09/11/2020 3:50:45 PM   Radiology DG Hip [place] W or [place] Pelvis 1 View Left  Result Date: 09/11/2020 CLINICAL DATA:  Postreduction EXAM: DG HIP (WITH OR WITHOUT PELVIS) 1V PORT LEFT COMPARISON:  07/12/2020 FINDINGS: There is been interval reduction of the left dislocation. The femoral head prosthesis is well seated within the acetabular prosthesis. No definite acute fracture is seen. Moderate right hip osteoarthritis is noted. IMPRESSION: Interval reduction of left hip dislocation in near anatomic alignment. Electronically Signed   By: Prudencio Pair M.D.   On: 09/11/2020 16:16   DG Hip Unilat W or Wo Pelvis 2-3 Views Left  Result Date: 09/11/2020 CLINICAL DATA:  Left hip pain.  Fall. EXAM: DG HIP (WITH OR WITHOUT PELVIS) 2-3V LEFT COMPARISON:  07/26/2020. FINDINGS: Superior and slightly posterior dislocation of the left hip arthroplasty. No acute fracture identified. IMPRESSION: Superior and slightly posterior dislocation of the left hip arthroplasty. Electronically Signed   By: Margaretha Sheffield MD   On: 09/11/2020 14:35    Procedures Procedures (including critical care time)  Medications Ordered in ED Medications  propofol (DIPRIVAN) 10 mg/mL bolus/IV push (has no administration in time range)  dextrose 50 % solution 50 mL (has no administration in time range)  metoprolol tartrate (LOPRESSOR) tablet 100 mg (100 mg Oral Given 09/11/20 1411)  HYDROmorphone (DILAUDID) injection 1 mg (1 mg Intravenous Given 09/11/20 1454)  propofol (DIPRIVAN) 10 mg/mL bolus/IV push 100 mg (90 mg Intravenous Given by Other 09/11/20 1518)    ED Course  I have reviewed the triage vital signs and the nursing  notes.  Pertinent labs & imaging results that were available during my care of the patient were reviewed by me and considered in my medical decision making (see chart for details).    MDM Rules/Calculators/A&P                          59 year old male present emergency department today for evaluation after a fall.  Complaining of left hip pain  Heart rate initially elevated and EKG shows a flutter.  He has not taken his home medications.  Metoprolol ordered and heart rate improved by the time of discharge.    CBC and BMP appear to be at baseline other than some hyperglycemia which improved after patient ate in the ED..  Covid test is negative.  X-ray reviewed/interpreted shows superior and slightly posterior dislocation of the left hip  Conscious sedation was performed by Dr. Verner Chol, supervising physician and Orion Crook, PA-C performed close reduction of the left hip.  Postreduction x-ray reviewed/interpreted and shows interval reduction of the left hip dislocation in near anatomic alignment.  Patient was placed in a knee immobilizer.  Offered crutches however he states the last time this occurred he used a walker at home and got around very well with this.  He will use his walker at home instead.  Have advised him to follow-up with his orthopedic doctor and return to the emergency department for new or worsening symptoms.  Final Clinical Impression(s) / ED Diagnoses Final diagnoses:  Fall, initial encounter  Posterior dislocation of left hip, initial encounter [facility])    Rx / DC Orders ED Discharge Orders    None       Rodney Booze, PA-C 09/11/20 1802    Sherwood Gambler, MD 09/12/20 1538

## 2020-09-11 NOTE — Consult Note (Signed)
Reason for Consult:Left hip dislocation Referring Physician: Zaeem [name] is an 59 y.o. male.  HPI: [name] was getting up from a chair when he felt his left hip give way and he fell to his knees. He had severe pain in the hip. He was unable to get up and called his brother to help. He was brought to the ED where x-rays showed an artificial hip dislocation and orthopedic surgery was consulted. He had a dislocation in the same hip a couple of months ago.  Past Medical History:  Diagnosis Date  . Anemia    labs today indicate (09/27/11)  . Arthritis    right knee  . Bleeding ulcer    hx of, required 6 units blood  . Blood transfusion    6 units with ulcer repair surgery  . Chronic kidney disease    stage IV, has fistula, never started diaylsis  . Complication of anesthesia    little slow to wake up with last surgery  . Dyspnea    with exertion  . Gout   . Hypertension   . Osteoporosis   . Pneumonia     Past Surgical History:  Procedure Laterality Date  . AV FISTULA PLACEMENT Left 09/07/2016   Procedure: ARTERIOVENOUS (AV) FISTULA CREATION LEFT ARM;  Surgeon: Angelia Mould, MD;  Location: [facility];  Service: Vascular;  Laterality: Left;  . COLONOSCOPY    . EXTERNAL EAR SURGERY    . HIP CLOSED REDUCTION Left 07/27/2020   Procedure: CLOSED REDUCTION HIP;  Surgeon: Marchia Bond, MD;  Location: [facility];  Service: Orthopedics;  Laterality: Left;  . HIP PINNING  11/2010  . JOINT REPLACEMENT    . OTHER SURGICAL HISTORY  ~ 08/1998   repair bleeding ulcer  . SHOULDER ARTHROSCOPY WITH ROTATOR CUFF REPAIR AND SUBACROMIAL DECOMPRESSION Right 05/26/2017   Procedure: SHOULDER ARTHROSCOPY WITH ROTATOR CUFF REPAIR AND SUBACROMIAL DECOMPRESSION;  Surgeon: Tania Ade, MD;  Location: [facility];  Service: Orthopedics;  Laterality: Right;  SHOULDER ARTHROSCOPY WITH ROTATOR CUFF REPAIR AND SUBACROMIAL DECOMPRESSION  . TOTAL HIP ARTHROPLASTY  10/06/11   left  . TOTAL HIP  ARTHROPLASTY  10/06/2011   Procedure: TOTAL HIP ARTHROPLASTY;  Surgeon: Kerin Salen;  Location: [facility];  Service: Orthopedics;  Laterality: Left;    Family History  Problem Relation Age of Onset  . Cancer Mother   . Atrial fibrillation Mother   . Hypertension Mother   . Cancer Father     Social History:  reports that he has been smoking cigarettes. He has a 8.00 pack-year smoking history. He quit smokeless tobacco use about 46 years ago.  His smokeless tobacco use included chew. He reports current alcohol use of about 5.0 standard drinks of alcohol per week. He reports that he does not use drugs.  Allergies:  Allergies  Allergen Reactions  . Penicillins Swelling, Rash and Other (See Comments)    Arms and eyes swell & skin breaks out  Has patient had a PCN reaction causing immediate rash, facial/tongue/throat swelling, SOB or lightheadedness with hypotension: Yes Has patient had a PCN reaction causing severe rash involving mucus membranes or skin necrosis: Yes Has patient had a PCN reaction that required hospitalization: No Has patient had a PCN reaction occurring within the last 10 years: No If all of the above answers are "NO", then may proceed with Cephalosporin use.     Medications: I have reviewed the patient's current medications.  No results found for this or  any previous visit (from the past 48 hour(s)).  DG Hip Unilat W or Wo Pelvis 2-3 Views Left  Result Date: 09/11/2020 CLINICAL DATA:  Left hip pain.  Fall. EXAM: DG HIP (WITH OR WITHOUT PELVIS) 2-3V LEFT COMPARISON:  07/26/2020. FINDINGS: Superior and slightly posterior dislocation of the left hip arthroplasty. No acute fracture identified. IMPRESSION: Superior and slightly posterior dislocation of the left hip arthroplasty. Electronically Signed   By: Margaretha Sheffield MD   On: 09/11/2020 14:35    Review of Systems  HENT: Negative for ear discharge, ear pain, hearing loss and tinnitus.   Eyes: Negative for  photophobia and pain.  Respiratory: Negative for cough and shortness of breath.   Cardiovascular: Negative for chest pain.  Gastrointestinal: Negative for abdominal pain, nausea and vomiting.  Genitourinary: Negative for dysuria, flank pain, frequency and urgency.  Musculoskeletal: Positive for arthralgias (Left hip). Negative for back pain, myalgias and neck pain.  Neurological: Negative for dizziness and headaches.  Hematological: Does not bruise/bleed easily.  Psychiatric/Behavioral: The patient is not nervous/anxious.    Blood pressure (!) 132/98, pulse (!) 118, temperature 98.2 F (36.8 C), temperature source Oral, resp. rate 19, height 5\' 9"  (1.753 m), weight 113.4 kg, SpO2 100 %. Physical Exam Constitutional:      General: He is not in acute distress.    Appearance: He is well-developed. He is not diaphoretic.  HENT:     Head: Normocephalic and atraumatic.  Eyes:     General: No scleral icterus.       Right eye: No discharge.        Left eye: No discharge.     Conjunctiva/sclera: Conjunctivae normal.  Cardiovascular:     Rate and Rhythm: Normal rate and regular rhythm.  Pulmonary:     Effort: Pulmonary effort is normal. No respiratory distress.  Musculoskeletal:     Cervical back: Normal range of motion.     Comments: LLE No traumatic wounds, ecchymosis, or rash  Minimal TTP hip  No knee or ankle effusion  Knee stable to varus/ valgus and anterior/posterior stress  Sens DPN, SPN, TN intact  Motor EHL, ext, flex, evers 5/5  DP 2+, PT 2+, No significant edema  Skin:    General: Skin is warm and dry.  Neurological:     Mental Status: He is alert.  Psychiatric:        Behavior: Behavior normal.     Assessment/Plan: Left hip dislocation -- Will attempt CR in ED under CS. He is to wear hip abduction brace (he says he has one at home) and f/u with Dr. Mayer Camel in a week or two.    Mark Abu, PA-C Orthopedic Surgery [phone] 09/11/2020, 2:43 PM

## 2020-09-11 NOTE — ED Notes (Signed)
Checked pt BG and it was 64. Pt alert and oriented able to eat. Pt was provided a box lunch and glucose rechecked. BGL 85

## 2020-09-11 NOTE — Progress Notes (Signed)
Orthopedic Tech Progress Note Patient Details:  [name] 1961-08-27 [id number]  Ortho Devices Type of Ortho Device: Knee Immobilizer Ortho Device/Splint Location: LLE Ortho Device/Splint Interventions: Ordered, Application, Adjustment   Post Interventions Patient Tolerated: Well Instructions Provided: Other (comment)   [name] 09/11/2020, 4:10 PM

## 2020-09-11 NOTE — ED Provider Notes (Signed)
.  Sedation  Date/Time: 09/11/2020 3:30 PM Performed by: Sherwood Gambler, MD Authorized by: Sherwood Gambler, MD   Consent:    Consent obtained:  Verbal and written   Consent given by:  Patient   Risks discussed:  Allergic reaction, dysrhythmia, inadequate sedation, nausea, prolonged hypoxia resulting in organ damage, prolonged sedation necessitating reversal, respiratory compromise necessitating ventilatory assistance and intubation and vomiting   Alternatives discussed:  Analgesia without sedation, anxiolysis and regional anesthesia Universal protocol:    Procedure explained and questions answered to patient or proxy's satisfaction: yes     Relevant documents present and verified: yes     Test results available and properly labeled: yes     Imaging studies available: yes     Required blood products, implants, devices, and special equipment available: yes     Site/side marked: yes     Immediately prior to procedure a time out was called: yes     Patient identity confirmation method:  Verbally with patient Indications:    Procedure necessitating sedation performed by:  Different physician Pre-sedation assessment:    Time since last food or drink:  6 hours   ASA classification: class 3 - patient with severe systemic disease     Neck mobility: normal     Mouth opening:  3 or more finger widths   Thyromental distance:  4 finger widths   Mallampati score:  I - soft palate, uvula, fauces, pillars visible   Pre-sedation assessments completed and reviewed: airway patency, cardiovascular function, hydration status, mental status, nausea/vomiting, pain level, respiratory function and temperature   Immediate pre-procedure details:    Reassessment: Patient reassessed immediately prior to procedure     Reviewed: vital signs, relevant labs/tests and NPO status     Verified: bag valve mask available, emergency equipment available, intubation equipment available, IV patency confirmed, oxygen available  and suction available   Procedure details (see MAR for exact dosages):    Preoxygenation:  Nasal cannula   Sedation:  Propofol   Intended level of sedation: deep   Intra-procedure monitoring:  Blood pressure monitoring, cardiac monitor, continuous pulse oximetry, frequent LOC assessments, frequent vital sign checks and continuous capnometry   Intra-procedure events: none     Total Provider sedation time (minutes):  10 Post-procedure details:    Attendance: Constant attendance by certified staff until patient recovered     Recovery: Patient returned to pre-procedure baseline     Post-sedation assessments completed and reviewed: airway patency, cardiovascular function, hydration status, mental status, nausea/vomiting, pain level, respiratory function and temperature     Patient is stable for discharge or admission: yes     Patient tolerance:  Tolerated well, no immediate complications      Sherwood Gambler, MD 09/11/20 1531

## 2020-09-11 NOTE — ED Triage Notes (Signed)
Pt had fall from standing. Reports left hip "gave out"  Pt reports had left hip reduction last month. Alert and oriented did not hit head does not take thinners

## 2020-09-11 NOTE — Procedures (Signed)
Procedure: Left hip closed reduction  Indication: Left hip dislocation  Surgeon: Silvestre Gunner, PA-C  Assist: Sherwood Gambler, MD  Anesthesia: Propofol via EDP  EBL: None  Complications: None  Findings: After risks/benefits explained patient desires to undergo procedure. Consent obtained and time out performed. Sedation administered and confirmed. The hip was relocated. Pt tolerated the procedure well. X-ray pending.    [name], PA-C Orthopedic Surgery [phone]

## 2020-09-11 NOTE — ED Notes (Signed)
Pt discharged via wheelchair. All questions and concerns addressed. No complaints at this time.

## 2020-09-11 NOTE — Discharge Instructions (Signed)
Please use your walker at home and keep the knee immobilizer on.  You will need to call your orthopedic doctor to schedule appointment for follow-up next week.  If you have any new or worsening symptoms in the meantime you need to return to the emergency department immediately.  Additionally your blood sugar was noted to be low while you are in the emergency department.  I suspect that that is because you not eat a lot today.  Please make sure you eat soon as you get home.

## 2020-09-11 NOTE — ED Notes (Signed)
Staff in room and timeout preformed at [place], [place] Dr. Regenia Skeeter  Courtni PA Resp Ortho tech RN x 2  1521- hip reduction successful will confirm with x-ray  Dr. Regenia Skeeter left bedside at 1525. Pt arousalable to light stimuli. Airway intact. All vital signs stable.

## 2020-09-14 ENCOUNTER — Other Ambulatory Visit: Payer: Self-pay | Admitting: Adult Health

## 2020-09-14 DIAGNOSIS — M1A9XX0 Chronic gout, unspecified, without tophus (tophi): Secondary | ICD-10-CM

## 2020-09-14 DIAGNOSIS — I4892 Unspecified atrial flutter: Secondary | ICD-10-CM

## 2020-09-14 DIAGNOSIS — R600 Localized edema: Secondary | ICD-10-CM

## 2020-09-17 ENCOUNTER — Other Ambulatory Visit: Payer: Self-pay | Admitting: Adult Health

## 2020-09-17 DIAGNOSIS — N185 Chronic kidney disease, stage 5: Secondary | ICD-10-CM

## 2020-10-08 ENCOUNTER — Emergency Department (HOSPITAL_COMMUNITY): Payer: 59

## 2020-10-08 ENCOUNTER — Other Ambulatory Visit: Payer: Self-pay

## 2020-10-08 ENCOUNTER — Encounter (HOSPITAL_COMMUNITY): Payer: Self-pay | Admitting: Internal Medicine

## 2020-10-08 ENCOUNTER — Inpatient Hospital Stay (HOSPITAL_COMMUNITY)
Admission: EM | Admit: 2020-10-08 | Discharge: 2020-10-16 | DRG: 308 | Disposition: A | Discharge status: Skilled Nursing Facility | Payer: 59 | Attending: Student | Admitting: Student

## 2020-10-08 DIAGNOSIS — E119 Type 2 diabetes mellitus without complications: Secondary | ICD-10-CM

## 2020-10-08 DIAGNOSIS — Z23 Encounter for immunization: Secondary | ICD-10-CM

## 2020-10-08 DIAGNOSIS — Z20822 Contact with and (suspected) exposure to covid-19: Secondary | ICD-10-CM | POA: Diagnosis present

## 2020-10-08 DIAGNOSIS — E1122 Type 2 diabetes mellitus with diabetic chronic kidney disease: Secondary | ICD-10-CM | POA: Diagnosis present

## 2020-10-08 DIAGNOSIS — R5381 Other malaise: Secondary | ICD-10-CM | POA: Diagnosis present

## 2020-10-08 DIAGNOSIS — T84021A Dislocation of internal left hip prosthesis, initial encounter: Secondary | ICD-10-CM | POA: Diagnosis present

## 2020-10-08 DIAGNOSIS — T447X6A Underdosing of beta-adrenoreceptor antagonists, initial encounter: Secondary | ICD-10-CM | POA: Diagnosis present

## 2020-10-08 DIAGNOSIS — D631 Anemia in chronic kidney disease: Secondary | ICD-10-CM | POA: Diagnosis present

## 2020-10-08 DIAGNOSIS — W1830XA Fall on same level, unspecified, initial encounter: Secondary | ICD-10-CM | POA: Diagnosis present

## 2020-10-08 DIAGNOSIS — I080 Rheumatic disorders of both mitral and aortic valves: Secondary | ICD-10-CM | POA: Diagnosis present

## 2020-10-08 DIAGNOSIS — M109 Gout, unspecified: Secondary | ICD-10-CM | POA: Diagnosis present

## 2020-10-08 DIAGNOSIS — D638 Anemia in other chronic diseases classified elsewhere: Secondary | ICD-10-CM | POA: Diagnosis present

## 2020-10-08 DIAGNOSIS — Z7901 Long term (current) use of anticoagulants: Secondary | ICD-10-CM

## 2020-10-08 DIAGNOSIS — Z8249 Family history of ischemic heart disease and other diseases of the circulatory system: Secondary | ICD-10-CM

## 2020-10-08 DIAGNOSIS — E875 Hyperkalemia: Secondary | ICD-10-CM | POA: Diagnosis present

## 2020-10-08 DIAGNOSIS — I4430 Unspecified atrioventricular block: Secondary | ICD-10-CM | POA: Diagnosis present

## 2020-10-08 DIAGNOSIS — I132 Hypertensive heart and chronic kidney disease with heart failure and with stage 5 chronic kidney disease, or end stage renal disease: Secondary | ICD-10-CM | POA: Diagnosis present

## 2020-10-08 DIAGNOSIS — Z79899 Other long term (current) drug therapy: Secondary | ICD-10-CM

## 2020-10-08 DIAGNOSIS — I4892 Unspecified atrial flutter: Principal | ICD-10-CM | POA: Diagnosis present

## 2020-10-08 DIAGNOSIS — Z91128 Patient's intentional underdosing of medication regimen for other reason: Secondary | ICD-10-CM

## 2020-10-08 DIAGNOSIS — K921 Melena: Secondary | ICD-10-CM | POA: Diagnosis present

## 2020-10-08 DIAGNOSIS — E8809 Other disorders of plasma-protein metabolism, not elsewhere classified: Secondary | ICD-10-CM | POA: Diagnosis not present

## 2020-10-08 DIAGNOSIS — R0602 Shortness of breath: Secondary | ICD-10-CM

## 2020-10-08 DIAGNOSIS — E872 Acidosis: Secondary | ICD-10-CM | POA: Diagnosis present

## 2020-10-08 DIAGNOSIS — Z6836 Body mass index (BMI) 36.0-36.9, adult: Secondary | ICD-10-CM

## 2020-10-08 DIAGNOSIS — S73005A Unspecified dislocation of left hip, initial encounter: Secondary | ICD-10-CM | POA: Diagnosis present

## 2020-10-08 DIAGNOSIS — F17210 Nicotine dependence, cigarettes, uncomplicated: Secondary | ICD-10-CM | POA: Diagnosis present

## 2020-10-08 DIAGNOSIS — Y792 Prosthetic and other implants, materials and accessory orthopedic devices associated with adverse incidents: Secondary | ICD-10-CM | POA: Diagnosis present

## 2020-10-08 DIAGNOSIS — M810 Age-related osteoporosis without current pathological fracture: Secondary | ICD-10-CM | POA: Diagnosis present

## 2020-10-08 DIAGNOSIS — Z96642 Presence of left artificial hip joint: Secondary | ICD-10-CM | POA: Diagnosis present

## 2020-10-08 DIAGNOSIS — I5033 Acute on chronic diastolic (congestive) heart failure: Secondary | ICD-10-CM | POA: Diagnosis present

## 2020-10-08 DIAGNOSIS — Z9181 History of falling: Secondary | ICD-10-CM

## 2020-10-08 DIAGNOSIS — Z7902 Long term (current) use of antithrombotics/antiplatelets: Secondary | ICD-10-CM

## 2020-10-08 DIAGNOSIS — I4891 Unspecified atrial fibrillation: Secondary | ICD-10-CM | POA: Diagnosis present

## 2020-10-08 DIAGNOSIS — N184 Chronic kidney disease, stage 4 (severe): Secondary | ICD-10-CM | POA: Diagnosis present

## 2020-10-08 DIAGNOSIS — N185 Chronic kidney disease, stage 5: Secondary | ICD-10-CM | POA: Diagnosis present

## 2020-10-08 LAB — CBC WITH DIFFERENTIAL/PLATELET
Abs Immature Granulocytes: 0.06 10*3/uL (ref 0.00–0.07)
Basophils Absolute: 0 10*3/uL (ref 0.0–0.1)
Basophils Relative: 0 %
Eosinophils Absolute: 0 10*3/uL (ref 0.0–0.5)
Eosinophils Relative: 0 %
HCT: 26.8 % — ABNORMAL LOW (ref 39.0–52.0)
Hemoglobin: 8 g/dL — ABNORMAL LOW (ref 13.0–17.0)
Immature Granulocytes: 1 %
Lymphocytes Relative: 10 %
Lymphs Abs: 0.9 10*3/uL (ref 0.7–4.0)
MCH: 31.9 pg (ref 26.0–34.0)
MCHC: 29.9 g/dL — ABNORMAL LOW (ref 30.0–36.0)
MCV: 106.8 fL — ABNORMAL HIGH (ref 80.0–100.0)
Monocytes Absolute: 0.7 10*3/uL (ref 0.1–1.0)
Monocytes Relative: 8 %
Neutro Abs: 7 10*3/uL (ref 1.7–7.7)
Neutrophils Relative %: 81 %
Platelets: 181 10*3/uL (ref 150–400)
RBC: 2.51 MIL/uL — ABNORMAL LOW (ref 4.22–5.81)
RDW: 15.2 % (ref 11.5–15.5)
WBC: 8.7 10*3/uL (ref 4.0–10.5)
nRBC: 0 % (ref 0.0–0.2)

## 2020-10-08 LAB — BASIC METABOLIC PANEL
Anion gap: 17 — ABNORMAL HIGH (ref 5–15)
BUN: 110 mg/dL — ABNORMAL HIGH (ref 8–23)
CO2: 14 mmol/L — ABNORMAL LOW (ref 22–32)
Calcium: 11 mg/dL — ABNORMAL HIGH (ref 8.9–10.3)
Chloride: 113 mmol/L — ABNORMAL HIGH (ref 98–111)
Creatinine, Ser: 4.74 mg/dL — ABNORMAL HIGH (ref 0.61–1.24)
GFR, Estimated: 13 mL/min — ABNORMAL LOW (ref >60)
Glucose, Bld: 68 mg/dL — ABNORMAL LOW (ref 70–99)
Potassium: 4.9 mmol/L (ref 3.5–5.1)
Sodium: 144 mmol/L (ref 135–145)

## 2020-10-08 LAB — TSH: TSH: 1.804 u[IU]/mL (ref 0.350–4.500)

## 2020-10-08 LAB — CK: Total CK: 124 U/L (ref 49–397)

## 2020-10-08 LAB — CBG MONITORING, ED: Glucose-Capillary: 102 mg/dL — ABNORMAL HIGH (ref 70–99)

## 2020-10-08 LAB — RESP PANEL BY RT-PCR (FLU A&B, COVID) ARPGX2
Influenza A by PCR: NEGATIVE
Influenza B by PCR: NEGATIVE
SARS Coronavirus 2 by RT PCR: NEGATIVE

## 2020-10-08 LAB — TROPONIN I (HIGH SENSITIVITY): Troponin I (High Sensitivity): 84 ng/L — ABNORMAL HIGH (ref <18)

## 2020-10-08 MED ORDER — PROPOFOL 10 MG/ML IV BOLUS
INTRAVENOUS | Status: AC | PRN
  Administered 2020-10-08: 100 mg via INTRAVENOUS

## 2020-10-08 MED ORDER — ONDANSETRON HCL 4 MG/2ML IJ SOLN
4.0000 mg | Freq: Once | INTRAMUSCULAR | Status: AC
  Administered 2020-10-08: 4 mg via INTRAVENOUS
  Filled 2020-10-08: qty 2

## 2020-10-08 MED ORDER — PROPOFOL 10 MG/ML IV BOLUS
INTRAVENOUS | Status: AC | PRN
  Administered 2020-10-08: 50 mg via INTRAVENOUS
  Administered 2020-10-08: 20 mg via INTRAVENOUS

## 2020-10-08 MED ORDER — SODIUM BICARBONATE 650 MG PO TABS
650.0000 mg | ORAL_TABLET | Freq: Three times a day (TID) | ORAL | Status: DC
  Administered 2020-10-08 – 2020-10-16 (×24): 650 mg via ORAL
  Filled 2020-10-08 (×26): qty 1

## 2020-10-08 MED ORDER — FENTANYL CITRATE (PF) 100 MCG/2ML IJ SOLN: 50.0000 ug | Freq: Once | INTRAMUSCULAR | Status: DC

## 2020-10-08 MED ORDER — ACETAMINOPHEN 325 MG PO TABS: 650.0000 mg | ORAL_TABLET | Freq: Four times a day (QID) | ORAL | Status: DC | PRN

## 2020-10-08 MED ORDER — PROPOFOL 10 MG/ML IV BOLUS
1.0000 mg/kg | Freq: Once | INTRAVENOUS | Status: DC
  Filled 2020-10-08: qty 20

## 2020-10-08 MED ORDER — MORPHINE SULFATE (PF) 4 MG/ML IV SOLN
4.0000 mg | Freq: Once | INTRAVENOUS | Status: AC
  Administered 2020-10-08: 4 mg via INTRAVENOUS
  Filled 2020-10-08: qty 1

## 2020-10-08 MED ORDER — FUROSEMIDE 40 MG PO TABS
40.0000 mg | ORAL_TABLET | Freq: Two times a day (BID) | ORAL | Status: DC
  Administered 2020-10-09 – 2020-10-10 (×3): 40 mg via ORAL
  Filled 2020-10-08: qty 2
  Filled 2020-10-08 (×2): qty 1

## 2020-10-08 MED ORDER — METOPROLOL TARTRATE 5 MG/5ML IV SOLN
5.0000 mg | Freq: Once | INTRAVENOUS | Status: AC
  Administered 2020-10-08: 5 mg via INTRAVENOUS
  Filled 2020-10-08: qty 5

## 2020-10-08 MED ORDER — PROPOFOL 10 MG/ML IV BOLUS
INTRAVENOUS | Status: AC | PRN
  Administered 2020-10-08: 40 mg via INTRAVENOUS

## 2020-10-08 MED ORDER — FERROUS SULFATE 325 (65 FE) MG PO TABS
325.0000 mg | ORAL_TABLET | Freq: Every day | ORAL | Status: DC
  Administered 2020-10-09 – 2020-10-16 (×8): 325 mg via ORAL
  Filled 2020-10-08 (×8): qty 1

## 2020-10-08 MED ORDER — APIXABAN 5 MG PO TABS
5.0000 mg | ORAL_TABLET | Freq: Two times a day (BID) | ORAL | Status: DC
  Administered 2020-10-08: 5 mg via ORAL
  Filled 2020-10-08: qty 1

## 2020-10-08 MED ORDER — DILTIAZEM HCL-DEXTROSE 125-5 MG/125ML-% IV SOLN (PREMIX)
5.0000 mg/h | INTRAVENOUS | Status: DC
  Administered 2020-10-08 – 2020-10-09 (×2): 5 mg/h via INTRAVENOUS
  Filled 2020-10-08 (×2): qty 125

## 2020-10-08 MED ORDER — ACETAMINOPHEN 650 MG RE SUPP: 650.0000 mg | Freq: Four times a day (QID) | RECTAL | Status: DC | PRN

## 2020-10-08 MED ORDER — VITAMIN B-12 1000 MCG PO TABS
1000.0000 ug | ORAL_TABLET | Freq: Every day | ORAL | Status: DC
  Administered 2020-10-08 – 2020-10-16 (×9): 1000 ug via ORAL
  Filled 2020-10-08 (×9): qty 1

## 2020-10-08 MED ORDER — METOPROLOL TARTRATE 25 MG PO TABS
100.0000 mg | ORAL_TABLET | Freq: Once | ORAL | Status: AC
  Administered 2020-10-08: 100 mg via ORAL
  Filled 2020-10-08: qty 4

## 2020-10-08 NOTE — Consult Note (Signed)
Reason for Consult:L hip dislocation Referring Physician: [name] is an 59 y.o. male.  HPI: h/o THA in 2012 with 2 recent dislocations in last 3 mo.  Was getting out of chair tonight and felt hip "buckle".  XR in ER showed recurrent dislocation.  Attempted reduction x 2 by EDP unsuccessful.   Past Medical History:  Diagnosis Date   Anemia    labs today indicate (09/27/11)   Arthritis    right knee   Bleeding ulcer    hx of, required 6 units blood   Blood transfusion    6 units with ulcer repair surgery   Chronic kidney disease    stage IV, has fistula, never started diaylsis   Complication of anesthesia    little slow to wake up with last surgery   Dyspnea    with exertion   Gout    Hypertension    Osteoporosis    Pneumonia     Past Surgical History:  Procedure Laterality Date   AV FISTULA PLACEMENT Left 09/07/2016   Procedure: ARTERIOVENOUS (AV) FISTULA CREATION LEFT ARM;  Surgeon: Angelia Mould, MD;  Location: [place];  Service: Vascular;  Laterality: Left;   COLONOSCOPY     EXTERNAL EAR SURGERY     HIP CLOSED REDUCTION Left 07/27/2020   Procedure: CLOSED REDUCTION HIP;  Surgeon: Marchia Bond, MD;  Location: [place];  Service: Orthopedics;  Laterality: Left;   HIP PINNING  11/2010   JOINT REPLACEMENT     OTHER SURGICAL HISTORY  ~ 08/1998   repair bleeding ulcer   SHOULDER ARTHROSCOPY WITH ROTATOR CUFF REPAIR AND SUBACROMIAL DECOMPRESSION Right 05/26/2017   Procedure: SHOULDER ARTHROSCOPY WITH ROTATOR CUFF REPAIR AND SUBACROMIAL DECOMPRESSION;  Surgeon: Tania Ade, MD;  Location: [place];  Service: Orthopedics;  Laterality: Right;  SHOULDER ARTHROSCOPY WITH ROTATOR CUFF REPAIR AND SUBACROMIAL DECOMPRESSION   TOTAL HIP ARTHROPLASTY  10/06/11   left   TOTAL HIP ARTHROPLASTY  10/06/2011   Procedure: TOTAL HIP ARTHROPLASTY;  Surgeon: Kerin Salen;  Location: [facility];  Service: Orthopedics;  Laterality: Left;    Family History   Problem Relation Age of Onset   Cancer Mother    Atrial fibrillation Mother    Hypertension Mother    Cancer Father     Social History:  reports that he has been smoking cigarettes. He has a 8.00 pack-year smoking history. He quit smokeless tobacco use about 46 years ago.  His smokeless tobacco use included chew. He reports current alcohol use of about 5.0 standard drinks of alcohol per week. He reports that he does not use drugs.  Allergies:  Allergies  Allergen Reactions   Penicillins Swelling, Rash and Other (See Comments)    Arms and eyes swell & skin breaks out  Has patient had a PCN reaction causing immediate rash, facial/tongue/throat swelling, SOB or lightheadedness with hypotension: Yes Has patient had a PCN reaction causing severe rash involving mucus membranes or skin necrosis: Yes Has patient had a PCN reaction that required hospitalization: No Has patient had a PCN reaction occurring within the last 10 years: No If all of the above answers are "NO", then may proceed with Cephalosporin use.     Medications: I have reviewed the patient's current medications.  Results for orders placed or performed during the hospital encounter of 10/08/20 (from the past 48 hour(s))  Basic metabolic panel     Status: Abnormal   Collection Time: 10/08/20  2:46 PM  Result Value Ref  Range   Sodium 144 135 - 145 mmol/L   Potassium 4.9 3.5 - 5.1 mmol/L   Chloride 113 (H) 98 - 111 mmol/L   CO2 14 (L) 22 - 32 mmol/L   Glucose, Bld 68 (L) 70 - 99 mg/dL    Comment: Glucose reference range applies only to samples taken after fasting for at least 8 hours.   BUN 110 (H) 8 - 23 mg/dL   Creatinine, Ser 4.74 (H) 0.61 - 1.24 mg/dL   Calcium 11.0 (H) 8.9 - 10.3 mg/dL   GFR, Estimated 13 (L) >60 mL/min    Comment: (NOTE) Calculated using the CKD-EPI Creatinine Equation (2021)    Anion gap 17 (H) 5 - 15    Comment: Performed at [facility] [address] [place], [place] [address]   CBC with Differential     Status: Abnormal   Collection Time: 10/08/20  2:46 PM  Result Value Ref Range   WBC 8.7 4.0 - 10.5 K/uL   RBC 2.51 (L) 4.22 - 5.81 MIL/uL   Hemoglobin 8.0 (L) 13.0 - 17.0 g/dL   HCT 26.8 (L) 39 - 52 %   MCV 106.8 (H) 80.0 - 100.0 fL   MCH 31.9 26.0 - 34.0 pg   MCHC 29.9 (L) 30.0 - 36.0 g/dL   RDW 15.2 11.5 - 15.5 %   Platelets 181 150 - 400 K/uL   nRBC 0.0 0.0 - 0.2 %   Neutrophils Relative % 81 %   Neutro Abs 7.0 1.7 - 7.7 K/uL   Lymphocytes Relative 10 %   Lymphs Abs 0.9 0.7 - 4.0 K/uL   Monocytes Relative 8 %   Monocytes Absolute 0.7 0.1 - 1.0 K/uL   Eosinophils Relative 0 %   Eosinophils Absolute 0.0 0.0 - 0.5 K/uL   Basophils Relative 0 %   Basophils Absolute 0.0 0.0 - 0.1 K/uL   Immature Granulocytes 1 %   Abs Immature Granulocytes 0.06 0.00 - 0.07 K/uL    Comment: Performed at [facility] [address] [place], [place] [address]  CK     Status: None   Collection Time: 10/08/20  2:46 PM  Result Value Ref Range   Total CK 124 49.0 - 397.0 U/L    Comment: Performed at [facility], [place] [address], [place], [place] [address]    CT Head Wo Contrast  Result Date: 10/08/2020 CLINICAL DATA:  Fall, moderate to severe. EXAM: CT HEAD WITHOUT CONTRAST TECHNIQUE: Contiguous axial images were obtained from the base of the skull through the vertex without intravenous contrast. COMPARISON:  07/26/2020 head CT. FINDINGS: Brain: No evidence of parenchymal hemorrhage or extra-axial fluid collection. No mass lesion, mass effect, or midline shift. No CT evidence of acute infarction. Nonspecific mild subcortical and periventricular white matter hypodensity, most in keeping with chronic small vessel ischemic change. Generalized cerebral volume loss. No ventriculomegaly. Vascular: No acute abnormality. Skull: No evidence of calvarial fracture. Sinuses/Orbits: The visualized paranasal sinuses are essentially clear. Other:  The mastoid air cells are  unopacified. IMPRESSION: 1. No evidence of acute intracranial abnormality. No evidence of calvarial fracture. 2. Generalized cerebral volume loss and mild chronic small vessel ischemic changes in the cerebral white matter. Electronically Signed   By: Ilona Sorrel M.D.   On: 10/08/2020 15:35   DG Hip Port Unilat W or Wo Pelvis 1 View Left  Result Date: 10/08/2020 CLINICAL DATA:  59 year old male status post reduction of left hip dislocation. EXAM: DG  HIP (WITH OR WITHOUT PELVIS) 1V PORT LEFT COMPARISON:  Earlier radiograph dated 10/08/2020. FINDINGS: Persistent superior dislocation of the left hip arthroplasty with femoral component superior to the acetabular cup. There is probable impaction of the femoral component on the superior aspect of the acetabular cup. No acute fracture. The bones are osteopenic. IMPRESSION: Persistent superior dislocation of the left hip arthroplasty with possible impaction. Electronically Signed   By: Anner Crete M.D.   On: 10/08/2020 18:32   DG Hip Unilat W or Wo Pelvis 2-3 Views Left  Result Date: 10/08/2020 CLINICAL DATA:  Left hip arthroplasty dislocation. EXAM: DG HIP (WITH OR WITHOUT PELVIS) 2-3V LEFT COMPARISON:  09/11/2020 FINDINGS: The femoral component of the left hip arthroplasty has dislocated superior to the acetabular component. No acute fracture. No evidence prosthesis component loosening. IMPRESSION: Dislocated left total hip arthroplasty. Electronically Signed   By: Lajean Manes M.D.   On: 10/08/2020 15:05    Review of Systems  All other systems reviewed and are negative.  Blood pressure 115/78, pulse (!) 115, temperature 98.7 F (37.1 C), temperature source Oral, resp. rate (!) 35, height 5\' 9"  (1.753 m), weight 113 kg, SpO2 97 %. Physical Exam Vitals reviewed.  Cardiovascular:     Pulses: Normal pulses.  Pulmonary:     Effort: Pulmonary effort is normal.  Musculoskeletal:     Comments: LLE shortened and internally rotated. NVID. + ADF  APF Pain with hip ROM  Neurological:     Mental Status: He is alert.     Assessment/Plan: Recurrent L hip dislocation s/p THA CR in ED with CS  Procedure: The pt was sedated with propofol by the EDP.  Once adequatedly sedated the hip was flexed and traction was applied with satisfactory reduction felt. Length and rotation were restored. Pt tolerated the procedure well.  Post recuction XR shows concentric reduction  If pt discharged will need to f/u in clinic tomorrow with Dr. Mayer Camel. If pt admitted Dr. Mayer Camel will see tomorrow.  Will need to be medically optimized for revision surgery   Mark [name] 10/08/2020, 7:28 PM

## 2020-10-08 NOTE — ED Triage Notes (Signed)
Pt to ED today for potential left hip dislocation. Pt reports turning in chair and felt hip pop. Pt then slid to floor. Did not hit head. Alert and oriented on RA. EMS gave 245mcg fentanyl IV  Pt has extensive hip hx. Pt seen here 11/4 for same and had successful hip reduction.

## 2020-10-08 NOTE — Progress Notes (Signed)
Orthopedic Tech Progress Note Patient Details:  Mark [name] 02/17/1961 [id number]  Ortho Devices Type of Ortho Device: Knee Immobilizer Ortho Device/Splint Location: LLE Ortho Device/Splint Interventions: Adjustment, Application, Ordered   Post Interventions Patient Tolerated: Well   Zandyr Barnhill A Servando Kyllonen 10/08/2020, 7:51 PM

## 2020-10-08 NOTE — ED Provider Notes (Signed)
[facility] EMERGENCY DEPARTMENT Provider Note   CSN: [id number] Arrival date & time: 10/08/20  1345     History Chief Complaint  Patient presents with  . Hip Pain    Mark [name] is a 59 y.o. male past medical history of anemia, blood transfusion, chronic kidney disease who presents for evaluation of left hip pain after mechanical fall.  He reports this morning about 9:30 AM, he is try to get out of his chair and his left hip gave out, causing him to fall.  He states he did not hit his head or have LOC.  He is on eliquis. He states that he had no preceding chest pain that caused him to fall.  He states that he has not been unable to get up and ambulate on his hip since this happened.  He states he has a history of similar and states that he feels like it is dislocated.  His son came to check on him about an hour or 2 later and found him, calling EMS.  He denies any neck pain, back pain, chest pain, difficulty breathing, abdominal pain, numbness/weakness.  The history is provided by the patient.       Past Medical History:  Diagnosis Date  . Anemia    labs today indicate (09/27/11)  . Arthritis    right knee  . Bleeding ulcer    hx of, required 6 units blood  . Blood transfusion    6 units with ulcer repair surgery  . Chronic kidney disease    stage IV, has fistula, never started diaylsis  . Complication of anesthesia    little slow to wake up with last surgery  . Dyspnea    with exertion  . Gout   . Hypertension   . Osteoporosis   . Pneumonia     Patient Active Problem List   Diagnosis Date Noted  . Atrial flutter with rapid ventricular response ([place]) 07/28/2020  . Sepsis ([place]) 07/26/2020  . Closed dislocation of left hip ([place]) 07/26/2020  . CKD (chronic kidney disease), stage IV ([place]) 07/26/2020  . Gout 07/26/2020  . Pneumonia 07/26/2020  . Diverticula of colon 04/29/2020  . Acute renal failure superimposed on chronic kidney disease ([place])  04/29/2020  . Diverticulitis   . Sinus tachycardia 10/21/2015  . DM (diabetes mellitus) ([place]) 10/09/2011  . Benign essential HTN 10/09/2011  . Post-traumatic osteoarthritis of left hip 10/06/2011    Past Surgical History:  Procedure Laterality Date  . AV FISTULA PLACEMENT Left 09/07/2016   Procedure: ARTERIOVENOUS (AV) FISTULA CREATION LEFT ARM;  Surgeon: Angelia Mould, MD;  Location: [place];  Service: Vascular;  Laterality: Left;  . COLONOSCOPY    . EXTERNAL EAR SURGERY    . HIP CLOSED REDUCTION Left 07/27/2020   Procedure: CLOSED REDUCTION HIP;  Surgeon: Marchia Bond, MD;  Location: [place];  Service: Orthopedics;  Laterality: Left;  . HIP PINNING  11/2010  . JOINT REPLACEMENT    . OTHER SURGICAL HISTORY  ~ 08/1998   repair bleeding ulcer  . SHOULDER ARTHROSCOPY WITH ROTATOR CUFF REPAIR AND SUBACROMIAL DECOMPRESSION Right 05/26/2017   Procedure: SHOULDER ARTHROSCOPY WITH ROTATOR CUFF REPAIR AND SUBACROMIAL DECOMPRESSION;  Surgeon: Tania Ade, MD;  Location: [facility];  Service: Orthopedics;  Laterality: Right;  SHOULDER ARTHROSCOPY WITH ROTATOR CUFF REPAIR AND SUBACROMIAL DECOMPRESSION  . TOTAL HIP ARTHROPLASTY  10/06/11   left  . TOTAL HIP ARTHROPLASTY  10/06/2011   Procedure: TOTAL HIP ARTHROPLASTY;  Surgeon:  Kerin Salen;  Location: [facility];  Service: Orthopedics;  Laterality: Left;       Family History  Problem Relation Age of Onset  . Cancer Mother   . Atrial fibrillation Mother   . Hypertension Mother   . Cancer Father     Social History   Tobacco Use  . Smoking status: Current Some Day Smoker    Packs/day: 0.25    Years: 32.00    Pack years: 8.00    Types: Cigarettes  . Smokeless tobacco: Former Systems developer    Types: Chew    Quit date: 24  . Tobacco comment: smoking cessation consult entered  Vaping Use  . Vaping Use: Never used  Substance Use Topics  . Alcohol use: Yes    Alcohol/week: 5.0 standard drinks    Types: 5 Cans of beer per week    Comment:  occasional  . Drug use: No    Home Medications Prior to Admission medications   Medication Sig Start Date End Date Taking? Authorizing Provider  acetaminophen (TYLENOL) 325 MG tablet Take 325 mg by mouth every 6 (six) hours as needed for mild pain or headache.     [provider]  allopurinol (ZYLOPRIM) 100 MG tablet Take 0.5 tablets (50 mg total) by mouth every Monday, Wednesday, and Friday. 08/18/20   Medina-Vargas, Monina C, NP  apixaban (ELIQUIS) 5 MG TABS tablet Take 1 tablet (5 mg total) by mouth 2 (two) times daily. 08/18/20   Medina-Vargas, Monina C, NP  cholecalciferol (VITAMIN D) 25 MCG (1000 UNIT) tablet Take 1 tablet (1,000 Units total) by mouth daily. 08/18/20   Medina-Vargas, Monina C, NP  ferrous sulfate 325 (65 FE) MG tablet Take 1 tablet (325 mg total) by mouth daily with breakfast. 08/18/20   Medina-Vargas, Monina C, NP  furosemide (LASIX) 40 MG tablet Take 1 tablet (40 mg total) by mouth 2 (two) times daily. 08/18/20   Medina-Vargas, Monina C, NP  metoprolol tartrate (LOPRESSOR) 100 MG tablet Take 1 tablet (100 mg total) by mouth 2 (two) times daily. 08/18/20   Medina-Vargas, Monina C, NP  Multiple Vitamin (MULTIVITAMIN WITH MINERALS) TABS tablet Take 1 tablet by mouth daily after breakfast.    [provider]  sodium bicarbonate 650 MG tablet Take 1 tablet (650 mg total) by mouth 3 (three) times daily. 08/18/20   Medina-Vargas, Monina C, NP    Allergies    Penicillins  Review of Systems   Review of Systems  Constitutional: Negative for fever.  Respiratory: Negative for cough and shortness of breath.   Cardiovascular: Negative for chest pain.  Gastrointestinal: Negative for abdominal pain, nausea and vomiting.  Musculoskeletal:       Left hip pain  Neurological: Negative for weakness, numbness and headaches.  All other systems reviewed and are negative.   Physical Exam Updated Vital Signs BP (!) 123/94   Pulse (!) 105   Temp 98.7 F (37.1 C)  (Oral)   Resp (!) 22   Ht 5\' 9"  (1.753 m)   Wt 113 kg   SpO2 100%   BMI 36.79 kg/m   Physical Exam Vitals and nursing note reviewed.  Constitutional:      Appearance: Normal appearance. He is well-developed.  HENT:     Head: Normocephalic and atraumatic.  Eyes:     General: Lids are normal.     Conjunctiva/sclera: Conjunctivae normal.     Pupils: Pupils are equal, round, and reactive to light.  Neck:     Comments:  Full flexion/extension and lateral movement of neck fully intact. No bony midline tenderness. No deformities or crepitus.  Cardiovascular:     Rate and Rhythm: Normal rate and regular rhythm.     Pulses: Normal pulses.          Radial pulses are 2+ on the right side and 2+ on the left side.       Dorsalis pedis pulses are 2+ on the right side and 2+ on the left side.     Heart sounds: Normal heart sounds. No murmur heard.  No friction rub. No gallop.   Pulmonary:     Effort: Pulmonary effort is normal.     Breath sounds: Normal breath sounds.  Abdominal:     Palpations: Abdomen is soft. Abdomen is not rigid.     Tenderness: There is no abdominal tenderness. There is no guarding.     Comments: Abdomen is soft, non-distended, non-tender. No rigidity, No guarding. No peritoneal signs.  Musculoskeletal:        General: Normal range of motion.     Cervical back: Full passive range of motion without pain.     Comments: Left lower extremity is shortened.  Limited range of motion secondary to pain.  Tenderness palpation of the left hip.  No tightness palpation of left femur, left knee, left tib-fib.  No tenderness palpation of the right lower extremity.  Skin:    General: Skin is warm and dry.     Capillary Refill: Capillary refill takes less than 2 seconds.     Comments: Good distal cap refill. LLE is not dusky in appearance or cool to touch.  Neurological:     Mental Status: He is alert and oriented to person, place, and time.     Comments: Sensation intact along major  nerve distributions of BLE   Psychiatric:        Speech: Speech normal.     ED Results / Procedures / Treatments   Labs (all labs ordered are listed, but only abnormal results are displayed) Labs Reviewed  BASIC METABOLIC PANEL - Abnormal; Notable for the following components:      Result Value   Chloride 113 (*)    CO2 14 (*)    Glucose, Bld 68 (*)    BUN 110 (*)    Creatinine, Ser 4.74 (*)    Calcium 11.0 (*)    GFR, Estimated 13 (*)    Anion gap 17 (*)    All other components within normal limits  CBC WITH DIFFERENTIAL/PLATELET - Abnormal; Notable for the following components:   RBC 2.51 (*)    Hemoglobin 8.0 (*)    HCT 26.8 (*)    MCV 106.8 (*)    MCHC 29.9 (*)    All other components within normal limits  CK    EKG None  Radiology CT Head Wo Contrast  Result Date: 10/08/2020 CLINICAL DATA:  Fall, moderate to severe. EXAM: CT HEAD WITHOUT CONTRAST TECHNIQUE: Contiguous axial images were obtained from the base of the skull through the vertex without intravenous contrast. COMPARISON:  07/26/2020 head CT. FINDINGS: Brain: No evidence of parenchymal hemorrhage or extra-axial fluid collection. No mass lesion, mass effect, or midline shift. No CT evidence of acute infarction. Nonspecific mild subcortical and periventricular white matter hypodensity, most in keeping with chronic small vessel ischemic change. Generalized cerebral volume loss. No ventriculomegaly. Vascular: No acute abnormality. Skull: No evidence of calvarial fracture. Sinuses/Orbits: The visualized paranasal sinuses are essentially clear. Other:  The  mastoid air cells are unopacified. IMPRESSION: 1. No evidence of acute intracranial abnormality. No evidence of calvarial fracture. 2. Generalized cerebral volume loss and mild chronic small vessel ischemic changes in the cerebral white matter. Electronically Signed   By: Ilona Sorrel M.D.   On: 10/08/2020 15:35   DG Hip Unilat W or Wo Pelvis 2-3 Views Left  Result  Date: 10/08/2020 CLINICAL DATA:  Left hip arthroplasty dislocation. EXAM: DG HIP (WITH OR WITHOUT PELVIS) 2-3V LEFT COMPARISON:  09/11/2020 FINDINGS: The femoral component of the left hip arthroplasty has dislocated superior to the acetabular component. No acute fracture. No evidence prosthesis component loosening. IMPRESSION: Dislocated left total hip arthroplasty. Electronically Signed   By: Lajean Manes M.D.   On: 10/08/2020 15:05    Procedures Procedures (including critical care time)  Medications Ordered in ED Medications  propofol (DIPRIVAN) 10 mg/mL bolus/IV push 113 mg (has no administration in time range)  ondansetron (ZOFRAN) injection 4 mg (4 mg Intravenous Given 10/08/20 1422)  morphine 4 MG/ML injection 4 mg (4 mg Intravenous Given 10/08/20 1421)    ED Course  I have reviewed the triage vital signs and the nursing notes.  Pertinent labs & imaging results that were available during my care of the patient were reviewed by me and considered in my medical decision making (see chart for details).    MDM Rules/Calculators/A&P                          59 year old male brought in by EMS for evaluation of fall.  Reports he fell about 9:30 AM this morning.  He was not found by his son until 1-1:30 PM.  States his hip gave out.  Does not think he hit his head but he is on Eliquis.  He has not been able to ambulate since this happened.  He sees Dr. Mayer Camel (Ortho) for hip issues.  On initially arrival, he is slightly tachycardic, hypertensive.  His left lower extremity shortened.  Concern for dislocation versus fracture.  He is neurovascularly intact.  Plan check x-rays, labs.  BMP shows BUN 110, creatinine 4.74.  CBC shows no leukocytosis.  Hemoglobin is 8.0.  CK is 124.  Patient signed out to [facility], PA-C pending XRs and CT scan of head.   Portions of this note were generated with [employer]. Dictation errors may occur despite best attempts at proofreading.  Final  Clinical Impression(s) / ED Diagnoses Final diagnoses:  Dislocation of left hip, initial encounter [facility])    Rx / DC Orders ED Discharge Orders    None       Desma Mcgregor 10/08/20 1612    Tegeler, Gwenyth Allegra, MD 10/09/20 [id number]

## 2020-10-08 NOTE — ED Provider Notes (Signed)
Physical Exam  BP 118/88    Pulse (!) 114    Temp 98.7 F (37.1 C) (Oral)    Resp (!) 21    Ht 5\' 9"  (1.753 m)    Wt 113 kg    SpO2 100%    BMI 36.79 kg/m   Physical Exam Vitals and nursing note reviewed.  Constitutional:      General: He is not in acute distress.    Appearance: He is well-developed. He is obese. He is not diaphoretic.  HENT:     Head: Normocephalic and atraumatic.  Eyes:     General: No scleral icterus.    Conjunctiva/sclera: Conjunctivae normal.  Cardiovascular:     Rate and Rhythm: Tachycardia present.  Pulmonary:     Effort: Pulmonary effort is normal. No respiratory distress.  Musculoskeletal:     Cervical back: Normal range of motion.  Skin:    Findings: No rash.  Neurological:     Mental Status: He is alert.     ED Course/Procedures      Procedures   CRITICAL CARE Performed by: Delia Heady   Total critical care time: 35 minutes  Critical care time was exclusive of separately billable procedures and treating other patients.  Critical care was necessary to treat or prevent imminent or life-threatening deterioration.  Critical care was time spent personally by me on the following activities: development of treatment plan with patient and/or surrogate as well as nursing, discussions with consultants, evaluation of patient's response to treatment, examination of patient, obtaining history from patient or surrogate, ordering and performing treatments and interventions, ordering and review of laboratory studies, ordering and review of radiographic studies, pulse oximetry and re-evaluation of patient's condition.  MDM   Care of patient assumed from PA Layden at 3:30 PM.  Agree with history, physical exam and plan.  See their note for further details.  Briefly, 59 y.o. male with PMH/PSH as below who presents with left hip pain after mechanical fall.  Had a left hip replacement several years ago by Dr. Mayer Camel.  He did lay on the ground for several hours  before his son found him.  Had a left hip dislocation about 1 month ago that was successfully reduced in the ER.  He denies chest pain. Lab work here including CBC unremarkable.  BMP does show findings consistent with his history of CKD.  CK is normal.  Past Medical History:  Diagnosis Date   Anemia    labs today indicate (09/27/11)   Arthritis    right knee   Bleeding ulcer    hx of, required 6 units blood   Blood transfusion    6 units with ulcer repair surgery   Chronic kidney disease    stage IV, has fistula, never started diaylsis   Complication of anesthesia    little slow to wake up with last surgery   Dyspnea    with exertion   Gout    Hypertension    Osteoporosis    Pneumonia    Past Surgical History:  Procedure Laterality Date   AV FISTULA PLACEMENT Left 09/07/2016   Procedure: ARTERIOVENOUS (AV) FISTULA CREATION LEFT ARM;  Surgeon: Angelia Mould, MD;  Location: [facility];  Service: Vascular;  Laterality: Left;   COLONOSCOPY     EXTERNAL EAR SURGERY     HIP CLOSED REDUCTION Left 07/27/2020   Procedure: CLOSED REDUCTION HIP;  Surgeon: Marchia Bond, MD;  Location: [facility];  Service: Orthopedics;  Laterality: Left;  HIP PINNING  11/2010   JOINT REPLACEMENT     OTHER SURGICAL HISTORY  ~ 08/1998   repair bleeding ulcer   SHOULDER ARTHROSCOPY WITH ROTATOR CUFF REPAIR AND SUBACROMIAL DECOMPRESSION Right 05/26/2017   Procedure: SHOULDER ARTHROSCOPY WITH ROTATOR CUFF REPAIR AND SUBACROMIAL DECOMPRESSION;  Surgeon: Tania Ade, MD;  Location: [facility];  Service: Orthopedics;  Laterality: Right;  SHOULDER ARTHROSCOPY WITH ROTATOR CUFF REPAIR AND SUBACROMIAL DECOMPRESSION   TOTAL HIP ARTHROPLASTY  10/06/11   left   TOTAL HIP ARTHROPLASTY  10/06/2011   Procedure: TOTAL HIP ARTHROPLASTY;  Surgeon: Kerin Salen;  Location: [facility];  Service: Orthopedics;  Laterality: Left;      Current Plan: Obtain CT of the head, x-ray of the left hip and perform  reduction. Dispo home if improved.   MDM/ED Course: 6:00 PM Attempted reduction at the bedside without success.  Patient was adequately sedated with propofol. Patient with heart rates in the 120s.  EKG shows atrial flutter. Chart review shows that he has a history of a flutter.  He ran out of his metoprolol and Eliquis approximately 2 months ago and has not yet followed up with cardiology for refills.  Will give IV and p.o. metoprolol and reassess.  6:45 PM Spoke to Dr. Tamera Punt, on-call orthopedist for [facility].  He will attempt reduction at the bedside. Patient HR remains >110 with EKG showing /  8:00 PM Attempted reduction at the bedside successful by Dr. Tamera Punt.  Postreduction film shows successful reduction. He was placed in a knee immobilizer I have ordered Cardizem infusion for patient.  Unfortunately he remains tachycardia most likely due to his noncompliance with his metoprolol.  Will admit to hospitalist service for further management of his symptoms.    8:34 PM Patient remains tachycardic.  Spoke to hospitalist Dr. Hal Hope who will admit the patient and asked that we dose his Eliquis per pharmacy consult to restart this. I have informed Dr. Tamera Punt that patient will be here overnight, Ortho to consult in the morning.  Consults: Ortho surgery- Dr. Tamera Punt Hospitalist- Dr. Hal Hope   Significant labs/images: CT Head Wo Contrast  Result Date: 10/08/2020 CLINICAL DATA:  Fall, moderate to severe. EXAM: CT HEAD WITHOUT CONTRAST TECHNIQUE: Contiguous axial images were obtained from the base of the skull through the vertex without intravenous contrast. COMPARISON:  07/26/2020 head CT. FINDINGS: Brain: No evidence of parenchymal hemorrhage or extra-axial fluid collection. No mass lesion, mass effect, or midline shift. No CT evidence of acute infarction. Nonspecific mild subcortical and periventricular white matter hypodensity, most in keeping with chronic small vessel  ischemic change. Generalized cerebral volume loss. No ventriculomegaly. Vascular: No acute abnormality. Skull: No evidence of calvarial fracture. Sinuses/Orbits: The visualized paranasal sinuses are essentially clear. Other:  The mastoid air cells are unopacified. IMPRESSION: 1. No evidence of acute intracranial abnormality. No evidence of calvarial fracture. 2. Generalized cerebral volume loss and mild chronic small vessel ischemic changes in the cerebral white matter. Electronically Signed   By: Ilona Sorrel M.D.   On: 10/08/2020 15:35   DG Hip Port Unilat W or Wo Pelvis 1 View Left  Result Date: 10/08/2020 CLINICAL DATA:  Status post reduction EXAM: DG HIP (WITH OR WITHOUT PELVIS) 1V PORT LEFT COMPARISON:  Film from earlier in the same day. FINDINGS: Left hip prosthesis has now been reduced into the acetabular component. No acute fracture is seen. No soft tissue abnormality is noted. IMPRESSION: Status post reduction. Electronically Signed   By: Linus Mako.D.  On: 10/08/2020 19:55   DG Hip Port Unilat W or Wo Pelvis 1 View Left  Result Date: 10/08/2020 CLINICAL DATA:  59 year old male status post reduction of left hip dislocation. EXAM: DG HIP (WITH OR WITHOUT PELVIS) 1V PORT LEFT COMPARISON:  Earlier radiograph dated 10/08/2020. FINDINGS: Persistent superior dislocation of the left hip arthroplasty with femoral component superior to the acetabular cup. There is probable impaction of the femoral component on the superior aspect of the acetabular cup. No acute fracture. The bones are osteopenic. IMPRESSION: Persistent superior dislocation of the left hip arthroplasty with possible impaction. Electronically Signed   By: Anner Crete M.D.   On: 10/08/2020 18:32   DG Hip Port Unilat W or Wo Pelvis 1 View Left  Result Date: 09/11/2020 CLINICAL DATA:  Postreduction EXAM: DG HIP (WITH OR WITHOUT PELVIS) 1V PORT LEFT COMPARISON:  07/12/2020 FINDINGS: There is been interval reduction of the left  dislocation. The femoral head prosthesis is well seated within the acetabular prosthesis. No definite acute fracture is seen. Moderate right hip osteoarthritis is noted. IMPRESSION: Interval reduction of left hip dislocation in near anatomic alignment. Electronically Signed   By: Prudencio Pair M.D.   On: 09/11/2020 16:16   DG Hip Unilat W or Wo Pelvis 2-3 Views Left  Result Date: 10/08/2020 CLINICAL DATA:  Left hip arthroplasty dislocation. EXAM: DG HIP (WITH OR WITHOUT PELVIS) 2-3V LEFT COMPARISON:  09/11/2020 FINDINGS: The femoral component of the left hip arthroplasty has dislocated superior to the acetabular component. No acute fracture. No evidence prosthesis component loosening. IMPRESSION: Dislocated left total hip arthroplasty. Electronically Signed   By: Lajean Manes M.D.   On: 10/08/2020 15:05   DG Hip Unilat W or Wo Pelvis 2-3 Views Left  Result Date: 09/11/2020 CLINICAL DATA:  Left hip pain.  Fall. EXAM: DG HIP (WITH OR WITHOUT PELVIS) 2-3V LEFT COMPARISON:  07/26/2020. FINDINGS: Superior and slightly posterior dislocation of the left hip arthroplasty. No acute fracture identified. IMPRESSION: Superior and slightly posterior dislocation of the left hip arthroplasty. Electronically Signed   By: Margaretha Sheffield MD   On: 09/11/2020 14:35    I personally reviewed and interpreted all labs.  The plan for this patient was discussed with Dr. Roslynn Amble, who voiced agreement and who oversaw evaluation and treatment of this patient.      Delia Heady, PA-C 10/08/20 2035    Lucrezia Starch, MD 10/09/20 1225

## 2020-10-08 NOTE — H&P (Signed)
History and Physical    Mark [name] EVO:[id number] DOB: 08/01/1961 DOA: 10/08/2020  PCP: Delrae Rend, MD  Patient coming from: Home.  Chief Complaint: Left hip pop.  HPI: Mark [name] is a 59 y.o. 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  Patient also has been having mild shortness of breath last couple of days denies chest pain productive cough fever or chills.  Did not hit his head or fall.  ED Course: In the ER x-rays revealed left hip dislocation which was reduced by orthopedic surgeon.  Per orthopedics patient will need definite surgery for revision.  While in the ER patient was found to be in atrial flutter with RVR was given p.o. metoprolol and IV metoprolol despite which patient's heart rate did not improve and was started on Cardizem drip.  On my exam patient denies chest pain but has mild shortness of breath.  EKG shows some ST-T changes in the inferior leads which I discussed with on-call cardiologist.  Metabolic panel and CBC are largely at baseline.  Chest x-ray is pending.  Review of Systems: As per HPI, rest all negative.   Past Medical History:  Diagnosis Date  . Anemia    labs today indicate (09/27/11)  . Arthritis    right knee  . Bleeding ulcer    hx of, required 6 units blood  . Blood transfusion    6 units with ulcer repair surgery  . Chronic kidney disease    stage IV, has fistula, never started diaylsis  . Complication of anesthesia    little slow to wake up with last surgery  . Dyspnea    with exertion  . Gout   . Hypertension   . Osteoporosis   . Pneumonia     Past  Surgical History:  Procedure Laterality Date  . AV FISTULA PLACEMENT Left 09/07/2016   Procedure: ARTERIOVENOUS (AV) FISTULA CREATION LEFT ARM;  Surgeon: Angelia Mould, MD;  Location: [place];  Service: Vascular;  Laterality: Left;  . COLONOSCOPY    . EXTERNAL EAR SURGERY    . HIP CLOSED REDUCTION Left 07/27/2020   Procedure: CLOSED REDUCTION HIP;  Surgeon: Marchia Bond, MD;  Location: [facility];  Service: Orthopedics;  Laterality: Left;  . HIP PINNING  11/2010  . JOINT REPLACEMENT    . OTHER SURGICAL HISTORY  ~ 08/1998   repair bleeding ulcer  . SHOULDER ARTHROSCOPY WITH ROTATOR CUFF REPAIR AND SUBACROMIAL DECOMPRESSION Right 05/26/2017   Procedure: SHOULDER ARTHROSCOPY WITH ROTATOR CUFF REPAIR AND SUBACROMIAL DECOMPRESSION;  Surgeon: Tania Ade, MD;  Location: [place];  Service: Orthopedics;  Laterality: Right;  SHOULDER ARTHROSCOPY WITH ROTATOR CUFF REPAIR AND SUBACROMIAL DECOMPRESSION  . TOTAL HIP ARTHROPLASTY  10/06/11   left  . TOTAL HIP ARTHROPLASTY  10/06/2011   Procedure: TOTAL HIP ARTHROPLASTY;  Surgeon: Kerin Salen;  Location: [place];  Service: Orthopedics;  Laterality: Left;     reports that he has been smoking cigarettes. He has a 8.00 pack-year smoking history. He quit smokeless tobacco use about 46 years ago.  His smokeless tobacco use included chew. He reports current alcohol use of  about 5.0 standard drinks of alcohol per week. He reports that he does not use drugs.  Allergies  Allergen Reactions  . Penicillins Swelling, Rash and Other (See Comments)    Arms and eyes swell & skin breaks out  Has patient had a PCN reaction causing immediate rash, facial/tongue/throat swelling, SOB or lightheadedness with hypotension: Yes Has patient had a PCN reaction causing severe rash involving mucus membranes or skin necrosis: Yes Has patient had a PCN reaction that required hospitalization: No Has patient had a PCN reaction occurring within the last 10 years: No If all of the  above answers are "NO", then may proceed with Cephalosporin use.     Family History  Problem Relation Age of Onset  . Cancer Mother   . Atrial fibrillation Mother   . Hypertension Mother   . Cancer Father     Prior to Admission medications   Medication Sig Start Date End Date Taking? Authorizing Provider  allopurinol (ZYLOPRIM) 300 MG tablet Take 300 mg by mouth 2 (two) times daily.  08/12/20  Yes [provider]  cholecalciferol (VITAMIN D) 25 MCG (1000 UNIT) tablet Take 1 tablet (1,000 Units total) by mouth daily. 08/18/20  Yes Medina-Vargas, Monina C, NP  ferrous sulfate 325 (65 FE) MG tablet Take 1 tablet (325 mg total) by mouth daily with breakfast. 08/18/20  Yes Medina-Vargas, Monina C, NP  furosemide (LASIX) 40 MG tablet Take 1 tablet (40 mg total) by mouth 2 (two) times daily. 08/18/20  Yes Medina-Vargas, Monina C, NP  Multiple Vitamin (MULTIVITAMIN WITH MINERALS) TABS tablet Take 1 tablet by mouth daily after breakfast.   Yes [provider]  sodium bicarbonate 650 MG tablet Take 1 tablet (650 mg total) by mouth 3 (three) times daily. 08/18/20  Yes Medina-Vargas, Monina C, NP  vitamin B-12 (CYANOCOBALAMIN) 1000 MCG tablet Take 1,000 mcg by mouth daily.   Yes [provider]  allopurinol (ZYLOPRIM) 100 MG tablet Take 0.5 tablets (50 mg total) by mouth every Monday, Wednesday, and Friday. Patient not taking: Reported on 10/08/2020 08/18/20   Medina-Vargas, Monina C, NP  apixaban (ELIQUIS) 5 MG TABS tablet Take 1 tablet (5 mg total) by mouth 2 (two) times daily. Patient not taking: Reported on 10/08/2020 08/18/20   Medina-Vargas, Monina C, NP  metoprolol tartrate (LOPRESSOR) 100 MG tablet Take 1 tablet (100 mg total) by mouth 2 (two) times daily. Patient not taking: Reported on 10/08/2020 08/18/20   Medina-Vargas, Senaida Lange, NP    Physical Exam: Constitutional: Moderately built and nourished. Vitals:   10/08/20 2015 10/08/20 2030 10/08/20 2045 10/08/20 2100   BP: (!) 134/92 (!) 141/94 (!) 133/102 (!) 132/91  Pulse: (!) 112 (!) 111 (!) 107 (!) 115  Resp: 17 (!) 26 (!) 22 (!) 25  Temp:      TempSrc:      SpO2: 99% 97%  93%  Weight:      Height:       Eyes: Anicteric no pallor. ENMT: No discharge from the ears eyes nose or mouth. Neck: No mass felt.  No neck rigidity. Respiratory: No rhonchi or crepitations. Cardiovascular: S1-S2 heard. Abdomen: Soft nontender bowel sounds present. Musculoskeletal: No edema.  Left leg is in brace. Skin: No rash. Neurologic: Alert awake oriented to time place and person.  Moves all extremities. Psychiatric: Appears normal.  Normal affect.   Labs on Admission: I have personally reviewed following labs and imaging studies  CBC: Recent Labs  Lab 10/08/20 1446  WBC 8.7  NEUTROABS 7.0  HGB 8.0*  HCT 26.8*  MCV 106.8*  PLT 814   Basic Metabolic Panel: Recent Labs  Lab 10/08/20 1446  NA 144  K 4.9  CL 113*  CO2 14*  GLUCOSE 68*  BUN 110*  CREATININE 4.74*  CALCIUM 11.0*   GFR: Estimated Creatinine Clearance: 20.3 mL/min (A) (by C-G formula based on SCr of 4.74 mg/dL (H)). Liver Function Tests: No results for input(s): AST, ALT, ALKPHOS, BILITOT, PROT, ALBUMIN in the last 168 hours. No results for input(s): LIPASE, AMYLASE in the last 168 hours. No results for input(s): AMMONIA in the last 168 hours. Coagulation Profile: No results for input(s): INR, PROTIME in the last 168 hours. Cardiac Enzymes: Recent Labs  Lab 10/08/20 1446  CKTOTAL 124   BNP (last 3 results) No results for input(s): PROBNP in the last 8760 hours. HbA1C: No results for input(s): HGBA1C in the last 72 hours. CBG: No results for input(s): GLUCAP in the last 168 hours. Lipid Profile: No results for input(s): CHOL, HDL, LDLCALC, TRIG, CHOLHDL, LDLDIRECT in the last 72 hours. Thyroid Function Tests: No results for input(s): TSH, T4TOTAL, FREET4, T3FREE, THYROIDAB in the last 72 hours. Anemia Panel: No results  for input(s): VITAMINB12, FOLATE, FERRITIN, TIBC, IRON, RETICCTPCT in the last 72 hours. Urine analysis:    Component Value Date/Time   COLORURINE YELLOW 07/26/2020 [place] 07/26/2020 1244   LABSPEC 1.012 07/26/2020 1244   PHURINE 5.0 07/26/2020 1244   GLUCOSEU NEGATIVE 07/26/2020 1244   HGBUR MODERATE (A) 07/26/2020 [place] 07/26/2020 1244   KETONESUR NEGATIVE 07/26/2020 1244   PROTEINUR 100 (A) 07/26/2020 1244   UROBILINOGEN 0.2 09/27/2011 1317   NITRITE NEGATIVE 07/26/2020 1244   LEUKOCYTESUR NEGATIVE 07/26/2020 1244   Sepsis Labs: @LABRCNTIP (procalcitonin:4,lacticidven:4) ) Recent Results (from the past 240 hour(s))  Resp Panel by RT-PCR (Flu A&B, Covid) Nasopharyngeal Swab     Status: None   Collection Time: 10/08/20  6:19 PM   Specimen: Nasopharyngeal Swab; Nasopharyngeal(NP) swabs in vial transport medium  Result Value Ref Range Status   SARS Coronavirus 2 by RT PCR NEGATIVE NEGATIVE Final    Comment: (NOTE) SARS-CoV-2 target nucleic acids are NOT DETECTED.  The SARS-CoV-2 RNA is generally detectable in upper respiratory specimens during the acute phase of infection. The lowest concentration of SARS-CoV-2 viral copies this assay can detect is 138 copies/mL. A negative result does not preclude SARS-Cov-2 infection and should not be used as the sole basis for treatment or other patient management decisions. A negative result may occur with  improper specimen collection/handling, submission of specimen other than nasopharyngeal swab, presence of viral mutation(s) within the areas targeted by this assay, and inadequate number of viral copies(<138 copies/mL). A negative result must be combined with clinical observations, patient history, and epidemiological information. The expected result is Negative.  Fact Sheet for Patients:  EntrepreneurPulse.com.au  Fact Sheet for Healthcare Providers:   IncredibleEmployment.be  This test is no t yet approved or cleared by the [place] FDA and  has been authorized for detection and/or diagnosis of SARS-CoV-2 by FDA under an Emergency Use Authorization (EUA). This EUA will remain  in effect (meaning this test can be used) for the duration of the COVID-19 declaration under Section 564(b)(1) of the Act, 21 U.S.C.section 360bbb-3(b)(1), unless the authorization is terminated  or revoked sooner.       Influenza A by PCR NEGATIVE NEGATIVE Final   Influenza B by PCR NEGATIVE NEGATIVE Final  Comment: (NOTE) The Xpert Xpress SARS-CoV-2/FLU/RSV plus assay is intended as an aid in the diagnosis of influenza from Nasopharyngeal swab specimens and should not be used as a sole basis for treatment. Nasal washings and aspirates are unacceptable for Xpert Xpress SARS-CoV-2/FLU/RSV testing.  Fact Sheet for Patients: EntrepreneurPulse.com.au  Fact Sheet for Healthcare Providers: IncredibleEmployment.be  This test is not yet approved or cleared by the [place] FDA and has been authorized for detection and/or diagnosis of SARS-CoV-2 by FDA under an Emergency Use Authorization (EUA). This EUA will remain in effect (meaning this test can be used) for the duration of the COVID-19 declaration under Section 564(b)(1) of the Act, 21 U.S.C. section 360bbb-3(b)(1), unless the authorization is terminated or revoked.  Performed at [facility], [place] [address], [place], [place] [address]      Radiological Exams on Admission: CT Head Wo Contrast  Result Date: 10/08/2020 CLINICAL DATA:  Fall, moderate to severe. EXAM: CT HEAD WITHOUT CONTRAST TECHNIQUE: Contiguous axial images were obtained from the base of the skull through the vertex without intravenous contrast. COMPARISON:  07/26/2020 head CT. FINDINGS: Brain: No evidence of parenchymal hemorrhage or extra-axial fluid collection.  No mass lesion, mass effect, or midline shift. No CT evidence of acute infarction. Nonspecific mild subcortical and periventricular white matter hypodensity, most in keeping with chronic small vessel ischemic change. Generalized cerebral volume loss. No ventriculomegaly. Vascular: No acute abnormality. Skull: No evidence of calvarial fracture. Sinuses/Orbits: The visualized paranasal sinuses are essentially clear. Other:  The mastoid air cells are unopacified. IMPRESSION: 1. No evidence of acute intracranial abnormality. No evidence of calvarial fracture. 2. Generalized cerebral volume loss and mild chronic small vessel ischemic changes in the cerebral white matter. Electronically Signed   By: Ilona Sorrel M.D.   On: 10/08/2020 15:35   DG Hip Port Unilat W or Wo Pelvis 1 View Left  Result Date: 10/08/2020 CLINICAL DATA:  Status post reduction EXAM: DG HIP (WITH OR WITHOUT PELVIS) 1V PORT LEFT COMPARISON:  Film from earlier in the same day. FINDINGS: Left hip prosthesis has now been reduced into the acetabular component. No acute fracture is seen. No soft tissue abnormality is noted. IMPRESSION: Status post reduction. Electronically Signed   By: Inez Catalina M.D.   On: 10/08/2020 19:55   DG Hip Port Unilat W or Wo Pelvis 1 View Left  Result Date: 10/08/2020 CLINICAL DATA:  59 year old male status post reduction of left hip dislocation. EXAM: DG HIP (WITH OR WITHOUT PELVIS) 1V PORT LEFT COMPARISON:  Earlier radiograph dated 10/08/2020. FINDINGS: Persistent superior dislocation of the left hip arthroplasty with femoral component superior to the acetabular cup. There is probable impaction of the femoral component on the superior aspect of the acetabular cup. No acute fracture. The bones are osteopenic. IMPRESSION: Persistent superior dislocation of the left hip arthroplasty with possible impaction. Electronically Signed   By: Anner Crete M.D.   On: 10/08/2020 18:32   DG Hip Unilat W or Wo Pelvis 2-3 Views  Left  Result Date: 10/08/2020 CLINICAL DATA:  Left hip arthroplasty dislocation. EXAM: DG HIP (WITH OR WITHOUT PELVIS) 2-3V LEFT COMPARISON:  09/11/2020 FINDINGS: The femoral component of the left hip arthroplasty has dislocated superior to the acetabular component. No acute fracture. No evidence prosthesis component loosening. IMPRESSION: Dislocated left total hip arthroplasty. Electronically Signed   By: Lajean Manes M.D.   On: 10/08/2020 15:05    EKG: Independently reviewed.  Atrial flutter with RVR.  Inferior leads ST-T changes.  Assessment/Plan Principal Problem:   Atrial flutter with rapid ventricular response (HCC) Active Problems:   DM (diabetes mellitus) ([place])   Closed dislocation of left hip (HCC)   CKD (chronic kidney disease), stage IV (HCC)   Atrial flutter ([place])    1. Atrial flutter with RVR presently on Cardizem infusion will check TSH cardiac markers and 2D echo.  Will consult cardiology.  Was started on apixaban for anticoagulation but since patient EKG was showing some abnormal changes in the inferior leads cardiologist recommended changing to heparin for now. 2. Abnormal EKG changes in inferior leads concerning for ST-T changes which I discussed with on-call cardiologist Dr. Herold Harms at this time advised to trend cardiac markers check 2D echo and since patient does not have active chest pain no active intervention required at this time.  To keep patient on heparin instead of apixaban since patient indicates needs procedure. 3. Recurrent left hip dislocation -appreciate orthopedics consult.  Orthopedic is planning definite revision surgery eventually. 4. Chronic anemia likely from renal disease follow CBC. 5. Chronic kidney disease stage V being followed by nephrologist.  Has a left forearm fistula. 6. Diabetes mellitus type 2 presently not on any medication.  Check CBGs closely. 7. History of gout on allopurinol.   DVT prophylaxis: Heparin infusion. Code Status: Full  code. Family Communication: Discussed with patient. Disposition Plan: Home. Consults called: Cardiology and orthopedics. Admission status: Observation.   Rise Patience MD [facility] Pager [phone].  If 7PM-7AM, please contact night-coverage [email or web address] Password [facility]  10/08/2020, 9:25 PM

## 2020-10-09 ENCOUNTER — Observation Stay (HOSPITAL_COMMUNITY): Payer: 59

## 2020-10-09 ENCOUNTER — Encounter (HOSPITAL_COMMUNITY): Payer: Self-pay | Admitting: Internal Medicine

## 2020-10-09 ENCOUNTER — Observation Stay (HOSPITAL_BASED_OUTPATIENT_CLINIC_OR_DEPARTMENT_OTHER): Payer: 59

## 2020-10-09 DIAGNOSIS — I4892 Unspecified atrial flutter: Principal | ICD-10-CM

## 2020-10-09 DIAGNOSIS — S73005S Unspecified dislocation of left hip, sequela: Secondary | ICD-10-CM | POA: Diagnosis not present

## 2020-10-09 DIAGNOSIS — E1122 Type 2 diabetes mellitus with diabetic chronic kidney disease: Secondary | ICD-10-CM

## 2020-10-09 DIAGNOSIS — I350 Nonrheumatic aortic (valve) stenosis: Secondary | ICD-10-CM

## 2020-10-09 DIAGNOSIS — I340 Nonrheumatic mitral (valve) insufficiency: Secondary | ICD-10-CM

## 2020-10-09 DIAGNOSIS — S73005A Unspecified dislocation of left hip, initial encounter: Secondary | ICD-10-CM

## 2020-10-09 DIAGNOSIS — N184 Chronic kidney disease, stage 4 (severe): Secondary | ICD-10-CM | POA: Diagnosis not present

## 2020-10-09 HISTORY — DX: Unspecified dislocation of left hip, initial encounter: S73.005A

## 2020-10-09 LAB — BASIC METABOLIC PANEL
Anion gap: 16 — ABNORMAL HIGH (ref 5–15)
BUN: 106 mg/dL — ABNORMAL HIGH (ref 8–23)
CO2: 16 mmol/L — ABNORMAL LOW (ref 22–32)
Calcium: 11.4 mg/dL — ABNORMAL HIGH (ref 8.9–10.3)
Chloride: 111 mmol/L (ref 98–111)
Creatinine, Ser: 4.62 mg/dL — ABNORMAL HIGH (ref 0.61–1.24)
GFR, Estimated: 14 mL/min — ABNORMAL LOW (ref >60)
Glucose, Bld: 113 mg/dL — ABNORMAL HIGH (ref 70–99)
Potassium: 5.4 mmol/L — ABNORMAL HIGH (ref 3.5–5.1)
Sodium: 143 mmol/L (ref 135–145)

## 2020-10-09 LAB — CBC
HCT: 25.2 % — ABNORMAL LOW (ref 39.0–52.0)
Hemoglobin: 7.8 g/dL — ABNORMAL LOW (ref 13.0–17.0)
MCH: 32.4 pg (ref 26.0–34.0)
MCHC: 31 g/dL (ref 30.0–36.0)
MCV: 104.6 fL — ABNORMAL HIGH (ref 80.0–100.0)
Platelets: 181 10*3/uL (ref 150–400)
RBC: 2.41 MIL/uL — ABNORMAL LOW (ref 4.22–5.81)
RDW: 15.5 % (ref 11.5–15.5)
WBC: 9 10*3/uL (ref 4.0–10.5)
nRBC: 0 % (ref 0.0–0.2)

## 2020-10-09 LAB — GLUCOSE, CAPILLARY
Glucose-Capillary: 100 mg/dL — ABNORMAL HIGH (ref 70–99)
Glucose-Capillary: 222 mg/dL — ABNORMAL HIGH (ref 70–99)

## 2020-10-09 LAB — CBG MONITORING, ED
Glucose-Capillary: 99 mg/dL (ref 70–99)
Glucose-Capillary: 112 mg/dL — ABNORMAL HIGH (ref 70–99)

## 2020-10-09 LAB — APTT: aPTT: 70 seconds — ABNORMAL HIGH (ref 24–36)

## 2020-10-09 LAB — TROPONIN I (HIGH SENSITIVITY): Troponin I (High Sensitivity): 79 ng/L — ABNORMAL HIGH (ref <18)

## 2020-10-09 MED ORDER — HEPARIN (PORCINE) 25000 UT/250ML-% IV SOLN
1850.0000 [IU]/h | INTRAVENOUS | Status: DC
  Administered 2020-10-09 (×2): 1600 [IU]/h via INTRAVENOUS
  Filled 2020-10-09 (×3): qty 250

## 2020-10-09 MED ORDER — ALLOPURINOL 100 MG PO TABS
50.0000 mg | ORAL_TABLET | ORAL | Status: DC
  Administered 2020-10-10 – 2020-10-15 (×5): 50 mg via ORAL
  Filled 2020-10-09 (×6): qty 1

## 2020-10-09 MED ORDER — SODIUM ZIRCONIUM CYCLOSILICATE 5 G PO PACK
5.0000 g | PACK | Freq: Once | ORAL | Status: DC
  Filled 2020-10-09 (×2): qty 1

## 2020-10-09 MED ORDER — METOPROLOL SUCCINATE ER 50 MG PO TB24
50.0000 mg | ORAL_TABLET | Freq: Every day | ORAL | Status: DC
  Administered 2020-10-09 – 2020-10-13 (×5): 50 mg via ORAL
  Filled 2020-10-09: qty 2
  Filled 2020-10-09 (×4): qty 1
  Filled 2020-10-09: qty 2

## 2020-10-09 MED ORDER — PERFLUTREN LIPID MICROSPHERE
1.0000 mL | INTRAVENOUS | Status: AC | PRN
  Administered 2020-10-09: 4 mL via INTRAVENOUS
  Filled 2020-10-09: qty 10

## 2020-10-09 MED ORDER — INFLUENZA VAC SPLIT QUAD 0.5 ML IM SUSY
0.5000 mL | PREFILLED_SYRINGE | INTRAMUSCULAR | Status: AC
  Administered 2020-10-10: 0.5 mL via INTRAMUSCULAR
  Filled 2020-10-09: qty 0.5

## 2020-10-09 MED ORDER — INSULIN ASPART 100 UNIT/ML ~~LOC~~ SOLN: 0.0000 [IU] | Freq: Three times a day (TID) | SUBCUTANEOUS | Status: DC

## 2020-10-09 MED ORDER — FUROSEMIDE 10 MG/ML IJ SOLN
60.0000 mg | Freq: Once | INTRAMUSCULAR | Status: AC
  Administered 2020-10-09: 60 mg via INTRAVENOUS
  Filled 2020-10-09: qty 6

## 2020-10-09 NOTE — ED Provider Notes (Signed)
.Sedation  Date/Time: 10/09/2020 12:29 PM Performed by: Lucrezia Starch, MD Authorized by: Lucrezia Starch, MD   Consent:    Consent obtained:  Verbal and written   Consent given by:  Patient   Risks discussed:  Allergic reaction, dysrhythmia, inadequate sedation, nausea, prolonged hypoxia resulting in organ damage, prolonged sedation necessitating reversal, respiratory compromise necessitating ventilatory assistance and intubation and vomiting   Alternatives discussed:  Analgesia without sedation, anxiolysis and regional anesthesia Universal protocol:    Procedure explained and questions answered to patient or proxy's satisfaction: yes     Relevant documents present and verified: yes     Test results available and properly labeled: yes     Imaging studies available: yes     Required blood products, implants, devices, and special equipment available: yes     Site/side marked: yes     Immediately prior to procedure a time out was called: yes     Patient identity confirmation method:  Verbally with patient Indications:    Procedure necessitating sedation performed by:  Physician performing sedation Pre-sedation assessment:    Time since last food or drink:  > 6 hours   ASA classification: class 3 - patient with severe systemic disease     Neck mobility: reduced     Mouth opening:  2 finger widths   Thyromental distance:  3 finger widths   Mallampati score:  I - soft palate, uvula, fauces, pillars visible   Pre-sedation assessments completed and reviewed: airway patency, cardiovascular function, hydration status, mental status, nausea/vomiting, pain level, respiratory function and temperature   Immediate pre-procedure details:    Reassessment: Patient reassessed immediately prior to procedure     Reviewed: vital signs, relevant labs/tests and NPO status     Verified: bag valve mask available, emergency equipment available, intubation equipment available, IV patency confirmed,  oxygen available and suction available   Procedure details (see MAR for exact dosages):    Preoxygenation:  Nasal cannula   Sedation:  Propofol   Intended level of sedation: deep   Intra-procedure monitoring:  Blood pressure monitoring, cardiac monitor, continuous pulse oximetry, frequent LOC assessments, frequent vital sign checks and continuous capnometry   Intra-procedure events: respiratory depression     Intra-procedure management:  Airway repositioning and BVM ventilation   Total Provider sedation time (minutes):  25 Post-procedure details:    Attendance: Constant attendance by certified staff until patient recovered     Recovery: Patient returned to pre-procedure baseline     Post-sedation assessments completed and reviewed: airway patency, cardiovascular function, hydration status, mental status, nausea/vomiting, pain level, respiratory function and temperature     Patient is stable for discharge or admission: yes     Patient tolerance:  Tolerated well, no immediate complications Comments:     Procedural sedation for posterior hip reduction.  See separate procedure note for hip reduction.  After receiving initial dose of propofol (100 mg, 0.9 mg/kg), patient had episode of apnea.  He required brief BVM and airway repositioning.  There were no episodes of hypoxia.  Initial reduction attempt was unsuccessful.  Patient had to be resedated for attempt with orthopedics.  Utilized much lower dose (50mg ) and patient tolerated well.    [facility] Injury Treatment  Date/Time: 10/09/2020 12:34 PM Performed by: Lucrezia Starch, MD Authorized by: Lucrezia Starch, MD   Consent:    Consent obtained:  Written   Consent given by:  Patient   Risks discussed:  Fracture, nerve damage, restricted joint movement, stiffness,  recurrent dislocation and irreducible dislocation   Alternatives discussed:  Referral, immobilization and delayed treatmentInjury location: hip Location details: left  hip Injury type: dislocation Dislocation type: posterior Spontaneous dislocation: no Prosthesis: yes Pre-procedure distal perfusion: normal Pre-procedure neurological function: normal Pre-procedure range of motion: normal  Anesthesia: Local anesthesia used: no  Patient sedated: Yes. Refer to sedation procedure documentation for details of sedation. Manipulation performed: yes Reduction method: traction and counter traction, abduction and external rotation Reduction successful: no Post-procedure neurovascular assessment: post-procedure neurovascularly intact Post-procedure distal perfusion: normal Post-procedure neurological function: normal Post-procedure range of motion: normal Patient tolerance: patient tolerated the procedure well with no immediate complications Comments: Attempted reduction by myself and by Dr. Ron Parker, unsuccessful.  Patient tolerated well, remained neurovascularly intact throughout.  Orthopedics, Dr. Tamera Punt came to bedside and was able to reduce the hip.       Lucrezia Starch, MD 10/09/20 1236

## 2020-10-09 NOTE — Progress Notes (Signed)
Orthopedic Tech Progress Note Patient Details:  Mark [name] [birth year] [id number] Called in brace Patient ID: Mark [name], male   DOB: 12-08-1960, 59 y.o.   MRN: [id number]   [name] 10/09/2020, 8:33 AM

## 2020-10-09 NOTE — Progress Notes (Signed)
ANTICOAGULATION CONSULT NOTE - Initial Consult  Pharmacy Consult for Heparin Indication: atrial fibrillation  Allergies  Allergen Reactions  . Penicillins Swelling, Rash and Other (See Comments)    Arms and eyes swell & skin breaks out  Has patient had a PCN reaction causing immediate rash, facial/tongue/throat swelling, SOB or lightheadedness with hypotension: Yes Has patient had a PCN reaction causing severe rash involving mucus membranes or skin necrosis: Yes Has patient had a PCN reaction that required hospitalization: No Has patient had a PCN reaction occurring within the last 10 years: No If all of the above answers are "NO", then may proceed with Cephalosporin use.     Patient Measurements: Height: 5\' 9"  (175.3 cm) Weight: 113 kg (249 lb 1.9 oz) IBW/kg (Calculated) : 70.7 Heparin Dosing Weight: 100 kg  Vital Signs: Temp: 98.3 F (36.8 C) (12/02 0528) Temp Source: Oral (12/02 0528) BP: 112/78 (12/02 0528) Pulse Rate: 67 (12/02 0528)  Labs: Recent Labs    10/08/20 1446 10/08/20 2121 10/08/20 2337 10/09/20 0343  HGB 8.0*  --   --  7.8*  HCT 26.8*  --   --  25.2*  PLT 181  --   --  181  CREATININE 4.74*  --   --  4.62*  CKTOTAL 124  --   --   --   TROPONINIHS  --  84* 79*  --     Estimated Creatinine Clearance: 20.8 mL/min (A) (by C-G formula based on SCr of 4.62 mg/dL (H)).   Medical History: Past Medical History:  Diagnosis Date  . Anemia    labs today indicate (09/27/11)  . Arthritis    right knee  . Bleeding ulcer    hx of, required 6 units blood  . Blood transfusion    6 units with ulcer repair surgery  . Chronic kidney disease    stage IV, has fistula, never started diaylsis  . Complication of anesthesia    little slow to wake up with last surgery  . Dyspnea    with exertion  . Gout   . Hypertension   . Osteoporosis   . Pneumonia     Medications:  No current facility-administered medications on file prior to encounter.   Current  Outpatient Medications on File Prior to Encounter  Medication Sig Dispense Refill  . allopurinol (ZYLOPRIM) 300 MG tablet Take 300 mg by mouth 2 (two) times daily.     . cholecalciferol (VITAMIN D) 25 MCG (1000 UNIT) tablet Take 1 tablet (1,000 Units total) by mouth daily. 30 tablet 0  . ferrous sulfate 325 (65 FE) MG tablet Take 1 tablet (325 mg total) by mouth daily with breakfast. 30 tablet 0  . furosemide (LASIX) 40 MG tablet Take 1 tablet (40 mg total) by mouth 2 (two) times daily. 60 tablet 0  . Multiple Vitamin (MULTIVITAMIN WITH MINERALS) TABS tablet Take 1 tablet by mouth daily after breakfast.    . sodium bicarbonate 650 MG tablet Take 1 tablet (650 mg total) by mouth 3 (three) times daily. 90 tablet 0  . vitamin B-12 (CYANOCOBALAMIN) 1000 MCG tablet Take 1,000 mcg by mouth daily.    Marland Kitchen allopurinol (ZYLOPRIM) 100 MG tablet Take 0.5 tablets (50 mg total) by mouth every Monday, Wednesday, and Friday. (Patient not taking: Reported on 10/08/2020) 6 tablet 0  . apixaban (ELIQUIS) 5 MG TABS tablet Take 1 tablet (5 mg total) by mouth 2 (two) times daily. (Patient not taking: Reported on 10/08/2020) 30 tablet 0  . metoprolol  tartrate (LOPRESSOR) 100 MG tablet Take 1 tablet (100 mg total) by mouth 2 (two) times daily. (Patient not taking: Reported on 10/08/2020) 60 tablet 0     Assessment: 59 y.o. male with Aflutter, Eliquis on hold, for heparin.  Eliquis given in ED at midnight  Goal of Therapy:  APTT 66-102 sec Heparin level 0.3-0.7 units/ml Monitor platelets by anticoagulation protocol: Yes   Plan:  Start heparin 1600 units/hr at noon APTT in 8 hours  Caryl Pina 10/09/2020,6:07 AM

## 2020-10-09 NOTE — Progress Notes (Signed)
PROGRESS NOTE    Mark [name]  ZOX:[id number] DOB: [birth year] DOA: 10/08/2020 PCP: Delrae Rend, MD    Brief Narrative:  59 year old male with history of atrial flutter, chronic kidney disease stage V not on dialysis, chronic anemia, previous left hip surgery, presents to the emergency room with left hip pain.  Imaging revealed left hip dislocation.  He was also noted to be tachycardic in rapid atrial flutter.  He was seen by orthopedic and hip was reduced.  Started on Cardizem infusion.  Cardiology following.   Assessment & Plan:   Principal Problem:   Atrial flutter with rapid ventricular response (HCC) Active Problems:   DM (diabetes mellitus) ([place])   Closed dislocation of left hip (HCC)   CKD (chronic kidney disease), stage IV (HCC)   Atrial flutter (HCC)   Atrial flutter with RVR.   -Patient was unable to take metoprolol at home -Started on Cardizem infusion in the hospital -Also started on Toprol -Overall heart rate appears to be improving -Monitor to ensure that he does not need to go back on drip -Currently anticoagulated with heparin  Abnormal EKG -Noted to have ST-T changes on admission -Echocardiogram performed did not show any wall motion abnormalities and troponins -Cardiology consulted  Recurrent left hip dislocation -Seen by orthopedics and underwent reduction -We will need outpatient follow-up to discuss further operative management -Currently in brace -Deferred weightbearing status to Ortho -Likely physical therapy evaluation  Chronic anemia -Likely related to kidney disease -Hemoglobin is currently at her baseline -No signs of bleeding  Chronic kidney disease stage IV-V -Patient has AV fistula in place, although he has not undergone dialysis -Creatinine appears to be near baseline -Continue to monitor  Hyperkalemia -Received a dose of Lokelma  Metabolic acidosis -Likely related to renal failure -He is on chronic oral bicarbonate  replacement  Acute on chronic diastolic congestive heart failure -He is noted to have some congestion on chest x-ray -Does feel short of breath and is on supplemental oxygen -1+ lower extremity edema bilaterally -He is on his home dose of Lasix -We will give 1 dose of IV Lasix  Diabetes type 2 -He is not on any chronic medications -Blood sugars have been stable -Continue on sliding scale  DVT prophylaxis: heparin infusion  Code Status: full code Family Communication: discussed with patient Disposition Plan: Status is: Observation  The patient will require care spanning > 2 midnights and should be moved to inpatient because: Ongoing active pain requiring inpatient pain management and Ongoing diagnostic testing needed not appropriate for outpatient work up  Dispo: The patient is from: Home              Anticipated d/c is to: TBD              Anticipated d/c date is: 2 days              Patient currently is not medically stable to d/c.    Consultants:   Ortho, Dr. Mayer Camel  Cardiology  Procedures:   Reduction of dislocation of left hip  Antimicrobials:      Subjective: Patient reports feeling better since admission. Shortness of breath is better. Hip pain is better since reduction  Objective: Vitals:   10/09/20 1315 10/09/20 1330 10/09/20 1345 10/09/20 1400  BP: 116/69 126/79 126/81 133/81  Pulse: 66 60 (!) 110 (!) 53  Resp: (!) 25 (!) 21 (!) 31 (!) 24  Temp:      TempSrc:      SpO2: 92%  98% 99% 97%  Weight:      Height:        Intake/Output Summary (Last 24 hours) at 10/09/2020 1510 Last data filed at 10/09/2020 1406 Gross per 24 hour  Intake 138.86 ml  Output --  Net 138.86 ml   Filed Weights   10/08/20 1349  Weight: 113 kg    Examination:  General exam: Appears calm and comfortable  Respiratory system: Clear to auscultation. Respiratory effort normal. Cardiovascular system: S1 & S2 heard, irregular. No JVD, murmurs, rubs, gallops or clicks. No  pedal edema. Gastrointestinal system: Abdomen is nondistended, soft and nontender. No organomegaly or masses felt. Normal bowel sounds heard. Central nervous system: Alert and oriented. No focal neurological deficits. Extremities: LLE in brace, 1+ edema in LE bilaterally Skin: No rashes, lesions or ulcers Psychiatry: Judgement and insight appear normal. Mood & affect appropriate.     Data Reviewed: I have personally reviewed following labs and imaging studies  CBC: Recent Labs  Lab 10/08/20 1446 10/09/20 0343  WBC 8.7 9.0  NEUTROABS 7.0  --   HGB 8.0* 7.8*  HCT 26.8* 25.2*  MCV 106.8* 104.6*  PLT 181 326   Basic Metabolic Panel: Recent Labs  Lab 10/08/20 1446 10/09/20 0343  NA 144 143  K 4.9 5.4*  CL 113* 111  CO2 14* 16*  GLUCOSE 68* 113*  BUN 110* 106*  CREATININE 4.74* 4.62*  CALCIUM 11.0* 11.4*   GFR: Estimated Creatinine Clearance: 20.8 mL/min (A) (by C-G formula based on SCr of 4.62 mg/dL (H)). Liver Function Tests: No results for input(s): AST, ALT, ALKPHOS, BILITOT, PROT, ALBUMIN in the last 168 hours. No results for input(s): LIPASE, AMYLASE in the last 168 hours. No results for input(s): AMMONIA in the last 168 hours. Coagulation Profile: No results for input(s): INR, PROTIME in the last 168 hours. Cardiac Enzymes: Recent Labs  Lab 10/08/20 1446  CKTOTAL 124   BNP (last 3 results) No results for input(s): PROBNP in the last 8760 hours. HbA1C: No results for input(s): HGBA1C in the last 72 hours. CBG: Recent Labs  Lab 10/08/20 2336 10/09/20 0750 10/09/20 1328  GLUCAP 102* 99 112*   Lipid Profile: No results for input(s): CHOL, HDL, LDLCALC, TRIG, CHOLHDL, LDLDIRECT in the last 72 hours. Thyroid Function Tests: Recent Labs    10/08/20 2233  TSH 1.804   Anemia Panel: No results for input(s): VITAMINB12, FOLATE, FERRITIN, TIBC, IRON, RETICCTPCT in the last 72 hours. Sepsis Labs: No results for input(s): PROCALCITON, LATICACIDVEN in the  last 168 hours.  Recent Results (from the past 240 hour(s))  Resp Panel by RT-PCR (Flu A&B, Covid) Nasopharyngeal Swab     Status: None   Collection Time: 10/08/20  6:19 PM   Specimen: Nasopharyngeal Swab; Nasopharyngeal(NP) swabs in vial transport medium  Result Value Ref Range Status   SARS Coronavirus 2 by RT PCR NEGATIVE NEGATIVE Final    Comment: (NOTE) SARS-CoV-2 target nucleic acids are NOT DETECTED.  The SARS-CoV-2 RNA is generally detectable in upper respiratory specimens during the acute phase of infection. The lowest concentration of SARS-CoV-2 viral copies this assay can detect is 138 copies/mL. A negative result does not preclude SARS-Cov-2 infection and should not be used as the sole basis for treatment or other patient management decisions. A negative result may occur with  improper specimen collection/handling, submission of specimen other than nasopharyngeal swab, presence of viral mutation(s) within the areas targeted by this assay, and inadequate number of viral copies(<138 copies/mL). A negative result  must be combined with clinical observations, patient history, and epidemiological information. The expected result is Negative.  Fact Sheet for Patients:  EntrepreneurPulse.com.au  Fact Sheet for Healthcare Providers:  IncredibleEmployment.be  This test is no t yet approved or cleared by the [place] FDA and  has been authorized for detection and/or diagnosis of SARS-CoV-2 by FDA under an Emergency Use Authorization (EUA). This EUA will remain  in effect (meaning this test can be used) for the duration of the COVID-19 declaration under Section 564(b)(1) of the Act, 21 U.S.C.section 360bbb-3(b)(1), unless the authorization is terminated  or revoked sooner.       Influenza A by PCR NEGATIVE NEGATIVE Final   Influenza B by PCR NEGATIVE NEGATIVE Final    Comment: (NOTE) The Xpert Xpress SARS-CoV-2/FLU/RSV plus assay is  intended as an aid in the diagnosis of influenza from Nasopharyngeal swab specimens and should not be used as a sole basis for treatment. Nasal washings and aspirates are unacceptable for Xpert Xpress SARS-CoV-2/FLU/RSV testing.  Fact Sheet for Patients: EntrepreneurPulse.com.au  Fact Sheet for Healthcare Providers: IncredibleEmployment.be  This test is not yet approved or cleared by the [place] FDA and has been authorized for detection and/or diagnosis of SARS-CoV-2 by FDA under an Emergency Use Authorization (EUA). This EUA will remain in effect (meaning this test can be used) for the duration of the COVID-19 declaration under Section 564(b)(1) of the Act, 21 U.S.C. section 360bbb-3(b)(1), unless the authorization is terminated or revoked.  Performed at [facility], [place] [address], [place], [place] [address]          Radiology Studies: CT Head Wo Contrast  Result Date: 10/08/2020 CLINICAL DATA:  Fall, moderate to severe. EXAM: CT HEAD WITHOUT CONTRAST TECHNIQUE: Contiguous axial images were obtained from the base of the skull through the vertex without intravenous contrast. COMPARISON:  07/26/2020 head CT. FINDINGS: Brain: No evidence of parenchymal hemorrhage or extra-axial fluid collection. No mass lesion, mass effect, or midline shift. No CT evidence of acute infarction. Nonspecific mild subcortical and periventricular white matter hypodensity, most in keeping with chronic small vessel ischemic change. Generalized cerebral volume loss. No ventriculomegaly. Vascular: No acute abnormality. Skull: No evidence of calvarial fracture. Sinuses/Orbits: The visualized paranasal sinuses are essentially clear. Other:  The mastoid air cells are unopacified. IMPRESSION: 1. No evidence of acute intracranial abnormality. No evidence of calvarial fracture. 2. Generalized cerebral volume loss and mild chronic small vessel ischemic changes in the  cerebral white matter. Electronically Signed   By: Ilona Sorrel M.D.   On: 10/08/2020 15:35   DG CHEST PORT 1 VIEW  Result Date: 10/09/2020 CLINICAL DATA:  Shortness of breath EXAM: PORTABLE CHEST 1 VIEW COMPARISON:  07/27/2020 FINDINGS: Cardiac shadow is enlarged stable. Lungs are well aerated bilaterally. Mild central vascular congestion is noted without interstitial edema. No focal infiltrate is noted. Rotation of the patient to the right accentuates the mediastinal markings. IMPRESSION: Increased vascular congestion without edema greater no other focal abnormality is noted. Electronically Signed   By: Inez Catalina M.D.   On: 10/09/2020 00:36   ECHOCARDIOGRAM COMPLETE  Result Date: 10/09/2020    ECHOCARDIOGRAM REPORT   Patient Name:   DYRON KAWANO Date of Exam: 10/09/2020 Medical Rec #:  [id number]        Height:       69.0 in Accession #:    [id number]       Weight:       249.1 lb Date of Birth:  06/27/1961        BSA:          2.268 m Patient Age:    30 years         BP:           113/70 mmHg Patient Gender: M                HR:           74 bpm. Exam Location:  Inpatient Procedure: 2D Echo, Cardiac Doppler, Color Doppler and Intracardiac            Opacification Agent Indications:    Atrial fibrillation  History:        Patient has prior history of Echocardiogram examinations, most                 recent 07/27/2020. Signs/Symptoms:Shortness of Breath; Risk                 Factors:Hypertension. CKD.  Sonographer:    Clayton Lefort RDCS (AE) Referring Phys: [place]  Sonographer Comments: Technically challenging study due to limited acoustic windows, Technically difficult study due to poor echo windows, patient is morbidly obese and no subcostal window. Image acquisition challenging due to respiratory motion. Imaging  windows limited by belt device surrounding abdomen in apical and subcostal areas. IMPRESSIONS  1. No subcostal views.  2. Left ventricular ejection fraction, by estimation, is  60 to 65%. The left ventricle has normal function. The left ventricle has no regional wall motion abnormalities. There is mild left ventricular hypertrophy. Left ventricular diastolic parameters are consistent with Grade III diastolic dysfunction (restrictive).  3. Right ventricular systolic function is normal. The right ventricular size is normal.  4. Left atrial size was severely dilated.  5. The mitral valve is normal in structure. Mild mitral valve regurgitation. No evidence of mitral stenosis. Severe mitral annular calcification.  6. The aortic valve is normal in structure. Aortic valve regurgitation is trivial. Moderate aortic valve stenosis. FINDINGS  Left Ventricle: Left ventricular ejection fraction, by estimation, is 60 to 65%. The left ventricle has normal function. The left ventricle has no regional wall motion abnormalities. Definity contrast agent was given IV to delineate the left ventricular  endocardial borders. The left ventricular internal cavity size was normal in size. There is mild left ventricular hypertrophy. Left ventricular diastolic parameters are consistent with Grade III diastolic dysfunction (restrictive). Right Ventricle: The right ventricular size is normal. Right ventricular systolic function is normal. Left Atrium: Left atrial size was severely dilated. Right Atrium: Right atrial size was normal in size. Pericardium: There is no evidence of pericardial effusion. Mitral Valve: The mitral valve is normal in structure. Severe mitral annular calcification. Mild mitral valve regurgitation. No evidence of mitral valve stenosis. Tricuspid Valve: The tricuspid valve is normal in structure. Tricuspid valve regurgitation is trivial. No evidence of tricuspid stenosis. Aortic Valve: The aortic valve is normal in structure. Aortic valve regurgitation is trivial. Moderate aortic stenosis is present. Aortic valve mean gradient measures 29.2 mmHg. Aortic valve peak gradient measures 53.9 mmHg.  Aortic valve area, by VTI measures 1.14 cm. Pulmonic Valve: The pulmonic valve was not well visualized. Pulmonic valve regurgitation is not visualized. No evidence of pulmonic stenosis. Aorta: The aortic root was not well visualized. Venous: The inferior vena cava was not well visualized.  Additional Comments: No subcostal views.  LEFT VENTRICLE PLAX 2D LVIDd:         4.70 cm LVIDs:  3.60 cm LV PW:         1.40 cm LV IVS:        1.30 cm LVOT diam:     2.00 cm LV SV:         90 LV SV Index:   40 LVOT Area:     3.14 cm  RIGHT VENTRICLE RV Basal diam:  3.30 cm RV S prime:     12.80 cm/s TAPSE (M-mode): 1.7 cm LEFT ATRIUM              Index       RIGHT ATRIUM           Index LA diam:        5.10 cm  2.25 cm/m  RA Area:     31.30 cm LA Vol (A2C):   152.0 ml 67.02 ml/m RA Volume:   113.00 ml 49.82 ml/m LA Vol (A4C):   226.0 ml 99.64 ml/m LA Biplane Vol: 186.0 ml 82.01 ml/m  AORTIC VALVE AV Area (Vmax):    0.98 cm AV Area (Vmean):   1.04 cm AV Area (VTI):     1.14 cm AV Vmax:           367.20 cm/s AV Vmean:          246.200 cm/s AV VTI:            0.786 m AV Peak Grad:      53.9 mmHg AV Mean Grad:      29.2 mmHg LVOT Vmax:         114.00 cm/s LVOT Vmean:        81.560 cm/s LVOT VTI:          0.285 m LVOT/AV VTI ratio: 0.36  AORTA Ao Root diam: 3.50 cm TRICUSPID VALVE TR Peak grad:   21.3 mmHg TR Vmax:        231.00 cm/s  SHUNTS Systemic VTI:  0.29 m Systemic Diam: 2.00 cm Kirk Ruths MD Electronically signed by Kirk Ruths MD Signature Date/Time: 10/09/2020/1:54:43 PM    Final    DG Hip Port Unilat W or Wo Pelvis 1 View Left  Result Date: 10/08/2020 CLINICAL DATA:  Status post reduction EXAM: DG HIP (WITH OR WITHOUT PELVIS) 1V PORT LEFT COMPARISON:  Film from earlier in the same day. FINDINGS: Left hip prosthesis has now been reduced into the acetabular component. No acute fracture is seen. No soft tissue abnormality is noted. IMPRESSION: Status post reduction. Electronically Signed   By: Inez Catalina M.D.   On: 10/08/2020 19:55   DG Hip Port Unilat W or Wo Pelvis 1 View Left  Result Date: 10/08/2020 CLINICAL DATA:  59 year old male status post reduction of left hip dislocation. EXAM: DG HIP (WITH OR WITHOUT PELVIS) 1V PORT LEFT COMPARISON:  Earlier radiograph dated 10/08/2020. FINDINGS: Persistent superior dislocation of the left hip arthroplasty with femoral component superior to the acetabular cup. There is probable impaction of the femoral component on the superior aspect of the acetabular cup. No acute fracture. The bones are osteopenic. IMPRESSION: Persistent superior dislocation of the left hip arthroplasty with possible impaction. Electronically Signed   By: Anner Crete M.D.   On: 10/08/2020 18:32   DG Hip Unilat W or Wo Pelvis 2-3 Views Left  Result Date: 10/08/2020 CLINICAL DATA:  Left hip arthroplasty dislocation. EXAM: DG HIP (WITH OR WITHOUT PELVIS) 2-3V LEFT COMPARISON:  09/11/2020 FINDINGS: The femoral component of the left hip arthroplasty has dislocated superior to the  acetabular component. No acute fracture. No evidence prosthesis component loosening. IMPRESSION: Dislocated left total hip arthroplasty. Electronically Signed   By: Lajean Manes M.D.   On: 10/08/2020 15:05        Scheduled Meds:  [START ON 10/10/2020] allopurinol  50 mg Oral Q M,W,F   ferrous sulfate  325 mg Oral Q breakfast   furosemide  60 mg Intravenous Once   furosemide  40 mg Oral BID   insulin aspart  0-6 Units Subcutaneous TID WC   metoprolol succinate  50 mg Oral Daily   sodium bicarbonate  650 mg Oral TID   sodium zirconium cyclosilicate  5 g Oral Once   vitamin B-12  1,000 mcg Oral Daily   Continuous Infusions:  diltiazem (CARDIZEM) infusion Stopped (10/09/20 1404)   heparin 1,600 Units/hr (10/09/20 1234)     LOS: 0 days    Time spent: 87mins    Kathie Dike, MD [facility]   If 7PM-7AM, please contact night-coverage [email or web address]  10/09/2020,  3:10 PM

## 2020-10-09 NOTE — Progress Notes (Signed)
[place] for Heparin Indication: atrial fibrillation  Allergies  Allergen Reactions  . Penicillins Swelling, Rash and Other (See Comments)    Arms and eyes swell & skin breaks out  Has patient had a PCN reaction causing immediate rash, facial/tongue/throat swelling, SOB or lightheadedness with hypotension: Yes Has patient had a PCN reaction causing severe rash involving mucus membranes or skin necrosis: Yes Has patient had a PCN reaction that required hospitalization: No Has patient had a PCN reaction occurring within the last 10 years: No If all of the above answers are "NO", then may proceed with Cephalosporin use.     Patient Measurements: Height: 5\' 9"  (175.3 cm) Weight: 113 kg (249 lb 1.9 oz) IBW/kg (Calculated) : 70.7 Heparin Dosing Weight: 100 kg  Vital Signs: Temp: 98 F (36.7 C) (12/02 1617) Temp Source: Oral (12/02 1617) BP: 98/64 (12/02 1617) Pulse Rate: 65 (12/02 1617)  Labs: Recent Labs    10/08/20 1446 10/08/20 2121 10/08/20 2337 10/09/20 0343 10/09/20 1940  HGB 8.0*  --   --  7.8*  --   HCT 26.8*  --   --  25.2*  --   PLT 181  --   --  181  --   APTT  --   --   --   --  70*  CREATININE 4.74*  --   --  4.62*  --   CKTOTAL 124  --   --   --   --   TROPONINIHS  --  84* 79*  --   --     Estimated Creatinine Clearance: 20.8 mL/min (A) (by C-G formula based on SCr of 4.62 mg/dL (H)).   Medical History: Past Medical History:  Diagnosis Date  . Anemia    labs today indicate (09/27/11)  . Arthritis    right knee  . Bleeding ulcer    hx of, required 6 units blood  . Blood transfusion    6 units with ulcer repair surgery  . Chronic kidney disease    stage IV, has fistula, never started diaylsis  . Complication of anesthesia    little slow to wake up with last surgery  . Dyspnea    with exertion  . Gout   . Hip dislocation, left ([place]) 10/09/2020  . Hypertension   . Osteoporosis   . Pneumonia      Medications:  No current facility-administered medications on file prior to encounter.   Current Outpatient Medications on File Prior to Encounter  Medication Sig Dispense Refill  . allopurinol (ZYLOPRIM) 300 MG tablet Take 300 mg by mouth 2 (two) times daily.     . cholecalciferol (VITAMIN D) 25 MCG (1000 UNIT) tablet Take 1 tablet (1,000 Units total) by mouth daily. 30 tablet 0  . ferrous sulfate 325 (65 FE) MG tablet Take 1 tablet (325 mg total) by mouth daily with breakfast. 30 tablet 0  . furosemide (LASIX) 40 MG tablet Take 1 tablet (40 mg total) by mouth 2 (two) times daily. 60 tablet 0  . Multiple Vitamin (MULTIVITAMIN WITH MINERALS) TABS tablet Take 1 tablet by mouth daily after breakfast.    . sodium bicarbonate 650 MG tablet Take 1 tablet (650 mg total) by mouth 3 (three) times daily. 90 tablet 0  . vitamin B-12 (CYANOCOBALAMIN) 1000 MCG tablet Take 1,000 mcg by mouth daily.    Marland Kitchen allopurinol (ZYLOPRIM) 100 MG tablet Take 0.5 tablets (50 mg total) by mouth every Monday, Wednesday, and Friday. (Patient  not taking: Reported on 10/08/2020) 6 tablet 0  . apixaban (ELIQUIS) 5 MG TABS tablet Take 1 tablet (5 mg total) by mouth 2 (two) times daily. (Patient not taking: Reported on 10/08/2020) 30 tablet 0  . metoprolol tartrate (LOPRESSOR) 100 MG tablet Take 1 tablet (100 mg total) by mouth 2 (two) times daily. (Patient not taking: Reported on 10/08/2020) 60 tablet 0     Assessment: 59 y.o. male with Aflutter, Eliquis on hold, for heparin.  Eliquis given in ED at midnight 12/1.   Initial aptt at goal (70s), hgb this am low at 7.8 but appears stable from previous draw. No bleeding issues noted. Will continue to monitor and dose adjust based on aptt's until apixaban is cleared.   Goal of Therapy:  APTT 66-102 sec Heparin level 0.3-0.7 units/ml Monitor platelets by anticoagulation protocol: Yes   Plan:  Continue heparin at 1600 units/hr Aptt and heparin level in am and daily until they  correlate Daily CBC  Erin Hearing PharmD., BCPS Clinical Pharmacist 10/09/2020 8:42 PM

## 2020-10-09 NOTE — Progress Notes (Signed)
  Echocardiogram 2D Echocardiogram has been performed.  Mark [name] 10/09/2020, 11:14 AM

## 2020-10-09 NOTE — Consult Note (Addendum)
Cardiology Consultation:   Patient ID: Mark [name]; [id number]; 02-21-1961   Admit date: 10/08/2020 Date of Consult: 10/09/2020  Primary Care Provider: Delrae Rend, MD Primary Cardiologist: Dr. Darl Householder 2016  Patient Profile:   Mark [name] is a 59 y.o. male with a hx of atrial flutter during a hospitalization 07/2020 (cariology curb-sided), CKD Stage V follows with nephrology, anemia, HTN and gout who is being seen today for the evaluation of recurrent atrial flutter with RVR at the request of Dr. Hal Hope.  History of Present Illness:   Mark [name] is a 59yo M with a hx as stated above who presented to [facility] 10/09/20 with concerns for left hip location.  Patient reports he was attempting to get out of a chair when he felt his hip pop similar to last hospital presentation at which time he presented to the ED for further evaluation.  On arrival, imaging revealed left hip dislocation which was reduced by orthopedic MD with recommendations for surgery for hip revision in several months.  He was incidentally also found to be in atrial flutter with RVR.  He was given PO metoprolol as well as IV metoprolol with no HR improvement. He was then trialed on IV diltiazem with heart rate improvement. CXR with increased vascular congestion without edema and no other focal abnormality.  Telemetry currently shows rate controlled atrial flutter.  He is completely asymptomatic without chest pain, shortness of breath, LE edema, orthopnea, or dizziness.  ED labs: Creatinine-4.62, K+- 5.4, Ca+-11.4, Hb 7.8, HsT at 84>>79. Echocardiogram performed today with a EF at 60-65% with no RWMA, mild LVH, G3DD, severely dilated LA and moderate AS.   He was hospitalized 07/2020 at which time cardiology was curb-sided for tachycardia which was ultimately found to be atrial flutter. He was started on Lopressor 25mg  BID.  He was determined to have a low CHADS2Vasc score however anticoagulation was still  recommended with Eliquis 5mg  BID in case cardioversion was needed in the future.  Plan was for outpatient follow-up and potential DCCV after adequate anticoagulation.  He was not felt to need long-term anticoagulation after 1 month post cardioversion.  Unfortunately, patient states he took 1 month supply of both metoprolol as well as Eliquis however given no refills, he stopped both of these and was not seen for follow-up.   Past Medical History:  Diagnosis Date   Anemia    labs today indicate (09/27/11)   Arthritis    right knee   Bleeding ulcer    hx of, required 6 units blood   Blood transfusion    6 units with ulcer repair surgery   Chronic kidney disease    stage IV, has fistula, never started diaylsis   Complication of anesthesia    little slow to wake up with last surgery   Dyspnea    with exertion   Gout    Hypertension    Osteoporosis    Pneumonia     Past Surgical History:  Procedure Laterality Date   AV FISTULA PLACEMENT Left 09/07/2016   Procedure: ARTERIOVENOUS (AV) FISTULA CREATION LEFT ARM;  Surgeon: Angelia Mould, MD;  Location: [place];  Service: Vascular;  Laterality: Left;   COLONOSCOPY     EXTERNAL EAR SURGERY     HIP CLOSED REDUCTION Left 07/27/2020   Procedure: CLOSED REDUCTION HIP;  Surgeon: Marchia Bond, MD;  Location: [place];  Service: Orthopedics;  Laterality: Left;   HIP PINNING  11/2010   JOINT REPLACEMENT  OTHER SURGICAL HISTORY  ~ 08/1998   repair bleeding ulcer   SHOULDER ARTHROSCOPY WITH ROTATOR CUFF REPAIR AND SUBACROMIAL DECOMPRESSION Right 05/26/2017   Procedure: SHOULDER ARTHROSCOPY WITH ROTATOR CUFF REPAIR AND SUBACROMIAL DECOMPRESSION;  Surgeon: Tania Ade, MD;  Location: [place];  Service: Orthopedics;  Laterality: Right;  SHOULDER ARTHROSCOPY WITH ROTATOR CUFF REPAIR AND SUBACROMIAL DECOMPRESSION   TOTAL HIP ARTHROPLASTY  10/06/11   left   TOTAL HIP ARTHROPLASTY  10/06/2011   Procedure: TOTAL HIP  ARTHROPLASTY;  Surgeon: Kerin Salen;  Location: [place];  Service: Orthopedics;  Laterality: Left;     Prior to Admission medications   Medication Sig Start Date End Date Taking? Authorizing Provider  allopurinol (ZYLOPRIM) 300 MG tablet Take 300 mg by mouth 2 (two) times daily.  08/12/20  Yes [provider]  cholecalciferol (VITAMIN D) 25 MCG (1000 UNIT) tablet Take 1 tablet (1,000 Units total) by mouth daily. 08/18/20  Yes Medina-Vargas, Monina C, NP  ferrous sulfate 325 (65 FE) MG tablet Take 1 tablet (325 mg total) by mouth daily with breakfast. 08/18/20  Yes Medina-Vargas, Monina C, NP  furosemide (LASIX) 40 MG tablet Take 1 tablet (40 mg total) by mouth 2 (two) times daily. 08/18/20  Yes Medina-Vargas, Monina C, NP  Multiple Vitamin (MULTIVITAMIN WITH MINERALS) TABS tablet Take 1 tablet by mouth daily after breakfast.   Yes [provider]  sodium bicarbonate 650 MG tablet Take 1 tablet (650 mg total) by mouth 3 (three) times daily. 08/18/20  Yes Medina-Vargas, Monina C, NP  vitamin B-12 (CYANOCOBALAMIN) 1000 MCG tablet Take 1,000 mcg by mouth daily.   Yes [provider]  allopurinol (ZYLOPRIM) 100 MG tablet Take 0.5 tablets (50 mg total) by mouth every Monday, Wednesday, and Friday. Patient not taking: Reported on 10/08/2020 08/18/20   Medina-Vargas, Monina C, NP  apixaban (ELIQUIS) 5 MG TABS tablet Take 1 tablet (5 mg total) by mouth 2 (two) times daily. Patient not taking: Reported on 10/08/2020 08/18/20   Medina-Vargas, Monina C, NP  metoprolol tartrate (LOPRESSOR) 100 MG tablet Take 1 tablet (100 mg total) by mouth 2 (two) times daily. Patient not taking: Reported on 10/08/2020 08/18/20   Medina-Vargas, Senaida Lange, NP    Inpatient Medications: Scheduled Meds:  [START ON 10/10/2020] allopurinol  50 mg Oral Q M,W,F   ferrous sulfate  325 mg Oral Q breakfast   furosemide  60 mg Intravenous Once   furosemide  40 mg Oral BID   insulin aspart  0-6 Units  Subcutaneous TID WC   metoprolol succinate  50 mg Oral Daily   sodium bicarbonate  650 mg Oral TID   sodium zirconium cyclosilicate  5 g Oral Once   vitamin B-12  1,000 mcg Oral Daily   Continuous Infusions:  diltiazem (CARDIZEM) infusion Stopped (10/09/20 1404)   heparin 1,600 Units/hr (10/09/20 1234)   PRN Meds: acetaminophen **OR** acetaminophen  Allergies:    Allergies  Allergen Reactions   Penicillins Swelling, Rash and Other (See Comments)    Arms and eyes swell & skin breaks out  Has patient had a PCN reaction causing immediate rash, facial/tongue/throat swelling, SOB or lightheadedness with hypotension: Yes Has patient had a PCN reaction causing severe rash involving mucus membranes or skin necrosis: Yes Has patient had a PCN reaction that required hospitalization: No Has patient had a PCN reaction occurring within the last 10 years: No If all of the above answers are "NO", then may proceed with Cephalosporin use.  Social History:   Social History   Socioeconomic History   Marital status: Single    Spouse name: Not on file   Number of children: Not on file   Years of education: Not on file   Highest education level: Not on file  Occupational History   Not on file  Tobacco Use   Smoking status: Current Some Day Smoker    Packs/day: 0.25    Years: 32.00    Pack years: 8.00    Types: Cigarettes   Smokeless tobacco: Former Systems developer    Types: Chew    Quit date: 1975   Tobacco comment: smoking cessation consult entered  Media planner   Vaping Use: Never used  Substance and Sexual Activity   Alcohol use: Yes    Alcohol/week: 5.0 standard drinks    Types: 5 Cans of beer per week    Comment: occasional   Drug use: No   Sexual activity: Yes  Other Topics Concern   Not on file  Social History Narrative   Not on file   Social Determinants of Health   Financial Resource Strain:    Difficulty of Paying Living Expenses: Not on file  Food  Insecurity:    Worried About [place] in the Last Year: Not on file   Ran Out of Food in the Last Year: Not on file  Transportation Needs:    Lack of Transportation (Medical): Not on file   Lack of Transportation (Non-Medical): Not on file  Physical Activity:    Days of Exercise per Week: Not on file   Minutes of Exercise per Session: Not on file  Stress:    Feeling of Stress : Not on file  Social Connections:    Frequency of Communication with Friends and Family: Not on file   Frequency of Social Gatherings with Friends and Family: Not on file   Attends Religious Services: Not on file   Active Member of Clubs or Organizations: Not on file   Attends Archivist Meetings: Not on file   Marital Status: Not on file  Intimate Partner Violence:    Fear of Current or Ex-Partner: Not on file   Emotionally Abused: Not on file   Physically Abused: Not on file   Sexually Abused: Not on file    Family History:   Family History  Problem Relation Age of Onset   Cancer Mother    Atrial fibrillation Mother    Hypertension Mother    Cancer Father    Family Status:  Family Status  Relation Name Status   Mother  Deceased   Father  Deceased    ROS:  Please see the history of present illness.  All other ROS reviewed and negative.     Physical Exam/Data:   Vitals:   10/09/20 1315 10/09/20 1330 10/09/20 1345 10/09/20 1400  BP: 116/69 126/79 126/81 133/81  Pulse: 66 60 (!) 110 (!) 53  Resp: (!) 25 (!) 21 (!) 31 (!) 24  Temp:      TempSrc:      SpO2: 92% 98% 99% 97%  Weight:      Height:        Intake/Output Summary (Last 24 hours) at 10/09/2020 1520 Last data filed at 10/09/2020 1406 Gross per 24 hour  Intake 138.86 ml  Output --  Net 138.86 ml   Filed Weights   10/08/20 1349  Weight: 113 kg   Body mass index is 36.79 kg/m.   General: Obese,  NAD Neck: Negative for carotid bruits. No JVD Lungs: Diminished bilaterally. No  wheezes, rales, or rhonchi. Breathing is unlabored. Cardiovascular: Regularly irregular. + murmur Abdomen: Soft, non-tender, non-distended. No obvious abdominal masses. Extremities: Mild edema.  Radial pulses 2+ bilaterally Neuro: Alert and oriented. No focal deficits. No facial asymmetry. MAE spontaneously. Psych: Responds to questions appropriately with normal affect.     EKG:  The EKG was personally reviewed and demonstrates: 10/08/2020 atrial flutter with HR 115 bpm Telemetry:  Telemetry was personally reviewed and demonstrates: 10/09/2020 atrial flutter with HR in the 60s  Relevant CV Studies:  Echocardiogram 10/09/2020:  1. No subcostal views.  2. Left ventricular ejection fraction, by estimation, is 60 to 65%. The  left ventricle has normal function. The left ventricle has no regional  wall motion abnormalities. There is mild left ventricular hypertrophy.  Left ventricular diastolic parameters  are consistent with Grade III diastolic dysfunction (restrictive).  3. Right ventricular systolic function is normal. The right ventricular  size is normal.  4. Left atrial size was severely dilated.  5. The mitral valve is normal in structure. Mild mitral valve  regurgitation. No evidence of mitral stenosis. Severe mitral annular  calcification.  6. The aortic valve is normal in structure. Aortic valve regurgitation is  trivial. Moderate aortic valve stenosis.   Echocardiogram 07/27/2020:  1. Left ventricular ejection fraction, by estimation, is 60 to 65%. The  left ventricle has normal function. Left ventricular endocardial border  not optimally defined to evaluate regional wall motion. Left ventricular  diastolic parameters are  indeterminate.  2. Right ventricular systolic function was not well visualized. The right  ventricular size is not well visualized.  3. Left atrial size was moderately dilated.  4. The mitral valve was not well visualized. No evidence of mitral  valve  regurgitation. No evidence of mitral stenosis.  5. The aortic valve was not well visualized. Aortic valve regurgitation  is not visualized. No aortic stenosis is present.  6. The inferior vena cava is normal in size with greater than 50%  respiratory variability, suggesting right atrial pressure of 3 mmHg.   Laboratory Data:  Chemistry Recent Labs  Lab 10/08/20 1446 10/09/20 0343  NA 144 143  K 4.9 5.4*  CL 113* 111  CO2 14* 16*  GLUCOSE 68* 113*  BUN 110* 106*  CREATININE 4.74* 4.62*  CALCIUM 11.0* 11.4*  GFRNONAA 13* 14*  ANIONGAP 17* 16*    Total Protein  Date Value Ref Range Status  07/28/2020 7.7 6.5 - 8.1 g/dL Final   Albumin  Date Value Ref Range Status  07/28/2020 2.9 (L) 3.5 - 5.0 g/dL Final   AST  Date Value Ref Range Status  07/28/2020 37 15 - 41 U/L Final   ALT  Date Value Ref Range Status  07/28/2020 17 0 - 44 U/L Final   Alkaline Phosphatase  Date Value Ref Range Status  07/28/2020 63 38 - 126 U/L Final   Total Bilirubin  Date Value Ref Range Status  07/28/2020 0.6 0.3 - 1.2 mg/dL Final   Hematology Recent Labs  Lab 10/08/20 1446 10/09/20 0343  WBC 8.7 9.0  RBC 2.51* 2.41*  HGB 8.0* 7.8*  HCT 26.8* 25.2*  MCV 106.8* 104.6*  MCH 31.9 32.4  MCHC 29.9* 31.0  RDW 15.2 15.5  PLT 181 181   Cardiac EnzymesNo results for input(s): TROPONINI in the last 168 hours. No results for input(s): TROPIPOC in the last 168 hours.  BNPNo results for input(s): BNP, PROBNP  in the last 168 hours.  DDimer No results for input(s): DDIMER in the last 168 hours. TSH:  Lab Results  Component Value Date   TSH 1.804 10/08/2020   Lipids:No results found for: CHOL, HDL, LDLCALC, LDLDIRECT, TRIG, CHOLHDL HgbA1c: Lab Results  Component Value Date   HGBA1C 5.4 10/06/2011    Radiology/Studies:  CT Head Wo Contrast  Result Date: 10/08/2020 CLINICAL DATA:  Fall, moderate to severe. EXAM: CT HEAD WITHOUT CONTRAST TECHNIQUE: Contiguous axial images  were obtained from the base of the skull through the vertex without intravenous contrast. COMPARISON:  07/26/2020 head CT. FINDINGS: Brain: No evidence of parenchymal hemorrhage or extra-axial fluid collection. No mass lesion, mass effect, or midline shift. No CT evidence of acute infarction. Nonspecific mild subcortical and periventricular white matter hypodensity, most in keeping with chronic small vessel ischemic change. Generalized cerebral volume loss. No ventriculomegaly. Vascular: No acute abnormality. Skull: No evidence of calvarial fracture. Sinuses/Orbits: The visualized paranasal sinuses are essentially clear. Other:  The mastoid air cells are unopacified. IMPRESSION: 1. No evidence of acute intracranial abnormality. No evidence of calvarial fracture. 2. Generalized cerebral volume loss and mild chronic small vessel ischemic changes in the cerebral white matter. Electronically Signed   By: Ilona Sorrel M.D.   On: 10/08/2020 15:35   DG CHEST PORT 1 VIEW  Result Date: 10/09/2020 CLINICAL DATA:  Shortness of breath EXAM: PORTABLE CHEST 1 VIEW COMPARISON:  07/27/2020 FINDINGS: Cardiac shadow is enlarged stable. Lungs are well aerated bilaterally. Mild central vascular congestion is noted without interstitial edema. No focal infiltrate is noted. Rotation of the patient to the right accentuates the mediastinal markings. IMPRESSION: Increased vascular congestion without edema greater no other focal abnormality is noted. Electronically Signed   By: Inez Catalina M.D.   On: 10/09/2020 00:36   ECHOCARDIOGRAM COMPLETE  Result Date: 10/09/2020    ECHOCARDIOGRAM REPORT   Patient Name:   Mark [name] Date of Exam: 10/09/2020 Medical Rec #:  [id number]        Height:       69.0 in Accession #:    [id number]       Weight:       249.1 lb Date of Birth:  [birth year]        BSA:          2.268 m Patient Age:    62 years         BP:           113/70 mmHg Patient Gender: M                HR:           74 bpm. Exam  Location:  Inpatient Procedure: 2D Echo, Cardiac Doppler, Color Doppler and Intracardiac            Opacification Agent Indications:    Atrial fibrillation  History:        Patient has prior history of Echocardiogram examinations, most                 recent 07/27/2020. Signs/Symptoms:Shortness of Breath; Risk                 Factors:Hypertension. CKD.  Sonographer:    Clayton Lefort RDCS (AE) Referring Phys: [place]  Sonographer Comments: Technically challenging study due to limited acoustic windows, Technically difficult study due to poor echo windows, patient is morbidly obese and no subcostal window. Image acquisition challenging due to respiratory motion. Imaging  windows limited by belt device surrounding abdomen in apical and subcostal areas. IMPRESSIONS  1. No subcostal views.  2. Left ventricular ejection fraction, by estimation, is 60 to 65%. The left ventricle has normal function. The left ventricle has no regional wall motion abnormalities. There is mild left ventricular hypertrophy. Left ventricular diastolic parameters are consistent with Grade III diastolic dysfunction (restrictive).  3. Right ventricular systolic function is normal. The right ventricular size is normal.  4. Left atrial size was severely dilated.  5. The mitral valve is normal in structure. Mild mitral valve regurgitation. No evidence of mitral stenosis. Severe mitral annular calcification.  6. The aortic valve is normal in structure. Aortic valve regurgitation is trivial. Moderate aortic valve stenosis. FINDINGS  Left Ventricle: Left ventricular ejection fraction, by estimation, is 60 to 65%. The left ventricle has normal function. The left ventricle has no regional wall motion abnormalities. Definity contrast agent was given IV to delineate the left ventricular  endocardial borders. The left ventricular internal cavity size was normal in size. There is mild left ventricular hypertrophy. Left ventricular diastolic  parameters are consistent with Grade III diastolic dysfunction (restrictive). Right Ventricle: The right ventricular size is normal. Right ventricular systolic function is normal. Left Atrium: Left atrial size was severely dilated. Right Atrium: Right atrial size was normal in size. Pericardium: There is no evidence of pericardial effusion. Mitral Valve: The mitral valve is normal in structure. Severe mitral annular calcification. Mild mitral valve regurgitation. No evidence of mitral valve stenosis. Tricuspid Valve: The tricuspid valve is normal in structure. Tricuspid valve regurgitation is trivial. No evidence of tricuspid stenosis. Aortic Valve: The aortic valve is normal in structure. Aortic valve regurgitation is trivial. Moderate aortic stenosis is present. Aortic valve mean gradient measures 29.2 mmHg. Aortic valve peak gradient measures 53.9 mmHg. Aortic valve area, by VTI measures 1.14 cm. Pulmonic Valve: The pulmonic valve was not well visualized. Pulmonic valve regurgitation is not visualized. No evidence of pulmonic stenosis. Aorta: The aortic root was not well visualized. Venous: The inferior vena cava was not well visualized.  Additional Comments: No subcostal views.  LEFT VENTRICLE PLAX 2D LVIDd:         4.70 cm LVIDs:         3.60 cm LV PW:         1.40 cm LV IVS:        1.30 cm LVOT diam:     2.00 cm LV SV:         90 LV SV Index:   40 LVOT Area:     3.14 cm  RIGHT VENTRICLE RV Basal diam:  3.30 cm RV S prime:     12.80 cm/s TAPSE (M-mode): 1.7 cm LEFT ATRIUM              Index       RIGHT ATRIUM           Index LA diam:        5.10 cm  2.25 cm/m  RA Area:     31.30 cm LA Vol (A2C):   152.0 ml 67.02 ml/m RA Volume:   113.00 ml 49.82 ml/m LA Vol (A4C):   226.0 ml 99.64 ml/m LA Biplane Vol: 186.0 ml 82.01 ml/m  AORTIC VALVE AV Area (Vmax):    0.98 cm AV Area (Vmean):   1.04 cm AV Area (VTI):     1.14 cm AV Vmax:           367.20 cm/s AV  Vmean:          246.200 cm/s AV VTI:            0.786  m AV Peak Grad:      53.9 mmHg AV Mean Grad:      29.2 mmHg LVOT Vmax:         114.00 cm/s LVOT Vmean:        81.560 cm/s LVOT VTI:          0.285 m LVOT/AV VTI ratio: 0.36  AORTA Ao Root diam: 3.50 cm TRICUSPID VALVE TR Peak grad:   21.3 mmHg TR Vmax:        231.00 cm/s  SHUNTS Systemic VTI:  0.29 m Systemic Diam: 2.00 cm Kirk Ruths MD Electronically signed by Kirk Ruths MD Signature Date/Time: 10/09/2020/1:54:43 PM    Final    DG Hip Port Unilat W or Wo Pelvis 1 View Left  Result Date: 10/08/2020 CLINICAL DATA:  Status post reduction EXAM: DG HIP (WITH OR WITHOUT PELVIS) 1V PORT LEFT COMPARISON:  Film from earlier in the same day. FINDINGS: Left hip prosthesis has now been reduced into the acetabular component. No acute fracture is seen. No soft tissue abnormality is noted. IMPRESSION: Status post reduction. Electronically Signed   By: Inez Catalina M.D.   On: 10/08/2020 19:55   DG Hip Port Unilat W or Wo Pelvis 1 View Left  Result Date: 10/08/2020 CLINICAL DATA:  59 year old male status post reduction of left hip dislocation. EXAM: DG HIP (WITH OR WITHOUT PELVIS) 1V PORT LEFT COMPARISON:  Earlier radiograph dated 10/08/2020. FINDINGS: Persistent superior dislocation of the left hip arthroplasty with femoral component superior to the acetabular cup. There is probable impaction of the femoral component on the superior aspect of the acetabular cup. No acute fracture. The bones are osteopenic. IMPRESSION: Persistent superior dislocation of the left hip arthroplasty with possible impaction. Electronically Signed   By: Anner Crete M.D.   On: 10/08/2020 18:32   DG Hip Unilat W or Wo Pelvis 2-3 Views Left  Result Date: 10/08/2020 CLINICAL DATA:  Left hip arthroplasty dislocation. EXAM: DG HIP (WITH OR WITHOUT PELVIS) 2-3V LEFT COMPARISON:  09/11/2020 FINDINGS: The femoral component of the left hip arthroplasty has dislocated superior to the acetabular component. No acute fracture. No evidence  prosthesis component loosening. IMPRESSION: Dislocated left total hip arthroplasty. Electronically Signed   By: Lajean Manes M.D.   On: 10/08/2020 15:05   Assessment and Plan:   1.  Atrial flutter with RVR: -Patient has a known history of atrial flutter during her recent hospitalization 07/2020.  Incidentally, he was here for similar issues at that time with a left hip dislocation found to have atrial flutter with RVR as a new diagnosis.  Cardiology was curb sided at which time plan was to start metoprolol tartrate 25 mg twice daily along with Eliquis 5 mg twice daily despite a low CHA2DS2-VASc score with plans for outpatient follow-up and potential DCCV if patient maintained AF.  Unfortunately, patient reports he took the 1 month supply of both metoprolol and Eliquis and given that there were no refills, he stopped both of these.  He was not seen in cardiology follow-up.  He then presented today with a left hip dislocation and found once again to be in atrial flutter with RVR.  He was initially treated with metoprolol p.o. as well as IV with poor response therefore IV diltiazem was initiated.  He remains in atrial flutter at this time however rates  are much more controlled.  Unfortunately, given his persistent hip dislocation, he will need surgery per patient report however this will be in several months after stabilization. -Given CHA2DS2VASc at least 2 (HTN, vascular disease) he will need long-term anticoagulation given to incidence of atrial flutter. -Tentative plan would be for at least 3 weeks of Eliquis then consideration for OP DCCV  -Continue diltiazem for now   2.  Chronic diastolic CHF: -Echocardiogram performed this admission with stable LVEF. -Appears to be volume balanced at this time  -Continue PO Lasix for now and follow I&Os and weight   3. CKD Stage V: -Creatinine, 4.62 -Has AV fistula, followed by [facility] -Sees OP nephrology   4. HTN: -Stable, 133/81>>>126/81>>126/ -Continue  Toprol, IV diltiazem    For questions or updates, please contact [place] Please consult [email or web address] for contact info under Cardiology/STEMI.   Lyndel Safe NP-C [facility] Pager: [phone] 10/09/2020 3:20 PM  Patient seen and examined with Kathyrn Drown NP-C.  Agree as above, with the following exceptions and changes as noted below.  Mark [name] is a 59 year old gentleman with a left hip dislocation, who we are seeing for atrial flutter now with controlled ventricular response.  He has been previously noted to have atrial flutter and was placed on rate control with metoprolol and Eliquis for anticoagulation with a plan for cardioversion as an outpatient.  Patient took 1 month of metoprolol and Eliquis due to lack of refills.  On presentation to the ER with repeat hip dislocation, he was again incidentally noted to be in atrial flutter with rapid ventricular response.  Diltiazem infusion was initiated and he is now rate controlled.  He is also on metoprolol for rate control at this time.he denies significant cardiovascular symptoms Gen: NAD, CV: Irregular rhythm with normal rate, heart sounds are distant but a 2/6 systolic ejection murmur is heard.  Lungs: clear, Abd: soft, Extrem: Extremities are warm, left leg is in a brace.  Trace edema.  Neuro/Psych: alert and oriented x 3, normal mood and affect, but drowsy. All available labs, radiology testing, previous records reviewed.   Given patient's body habitus and atrial size, I suspect he may intermittently be in atrial flutter at home as well.  He tells me that there is plan for elective surgical management of his hip dislocation several months from now.  Therefore given that he is hemodynamically stable and well rate controlled in atrial flutter, there is no urgency to cardiovert.  With that in mind we will continue the plan for 3 to 4 weeks of outpatient anticoagulation without interruption and consider cardioversion afterward if he  remains in atrial flutter.  Recommend continuing metoprolol, and consider transition to oral diltiazem to facilitate hospital dismissal when ready.  Would be happy to see him in my office prior to his planned outpatient cardioversion in approximately 1 month.  His CHA2DS2-VASc or is approximately 2 for hypertension and evidence of vascular disease on CT, therefore anticoagulation with Eliquis is indicated, and will be required prior to planned cardioversion.  With severe left atrial dilation, suspect he will intermittently have atrial arrhythmia, and cardioversion will need to be discussed and carefully considered.  [name], MD 10/10/20 5:16 AM

## 2020-10-09 NOTE — Progress Notes (Signed)
Patient ID: Mark [name], male   DOB: [birth year], 59 y.o.   MRN: [id number] Subjective: Patient sustained a left femoral neck fracture in the year 2012 had a hip pinning that failed was referred to our practice and had a left total hip arthroplasty placed.  Did well until September of this year when he slipped and fell and dislocated his left total hip which is a DePuy Pinnacle shell an S-ROM prosthesis.  Since then he has had a couple more slips and falls with dislocations most recently last night.  He underwent closed reduction with Dr. Tamera Punt which was successful and is presently in a knee immobilizer.  More importantly he is in atrial flutter has stage IV renal failure when I saw him today he was actually short of breath.  Reviewing his chart he has significant chronic anemia probably from his kidney disease with hemoglobin is running in the low eights.  That also compounds his weakness.  On further questioning his had problems with weakness and falling over the last year as his renal failure has progressed as well as his cardiac issues.  Objective: Surgical wound of the left hip is well-healed internal and external rotation causes minimal discomfort leg lengths are equal foot tap is negative nontender to palpation over the groin and greater trochanter I do not believe there is any evidence of infection.  I did review his post reduction x-ray showing well-placed well fixed implants with normal geometry.  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  The hip is geometrically stable the reasons for his dislocations are weakness with falls as well as difficulty following posterior hip guidelines.  Plan: Posterior hip guidelines were discussed with the patient.  We will also get him set up for a hinged abduction brace set at 60 flexion and 15 of abduction.  This can be placed on him when he is in the hospital.  I would like to see him back  in the office in 1 week to have a further discussion specifically the possibility of revising him to a dual mobility bearing system.  That will be a big operation and he will have to be medically tuned up prior to that.

## 2020-10-10 DIAGNOSIS — F17210 Nicotine dependence, cigarettes, uncomplicated: Secondary | ICD-10-CM | POA: Diagnosis present

## 2020-10-10 DIAGNOSIS — R7989 Other specified abnormal findings of blood chemistry: Secondary | ICD-10-CM | POA: Diagnosis not present

## 2020-10-10 DIAGNOSIS — S73005S Unspecified dislocation of left hip, sequela: Secondary | ICD-10-CM | POA: Diagnosis not present

## 2020-10-10 DIAGNOSIS — N184 Chronic kidney disease, stage 4 (severe): Secondary | ICD-10-CM | POA: Diagnosis not present

## 2020-10-10 DIAGNOSIS — E872 Acidosis: Secondary | ICD-10-CM | POA: Diagnosis present

## 2020-10-10 DIAGNOSIS — W1830XA Fall on same level, unspecified, initial encounter: Secondary | ICD-10-CM | POA: Diagnosis present

## 2020-10-10 DIAGNOSIS — I5033 Acute on chronic diastolic (congestive) heart failure: Secondary | ICD-10-CM | POA: Diagnosis present

## 2020-10-10 DIAGNOSIS — Z20822 Contact with and (suspected) exposure to covid-19: Secondary | ICD-10-CM | POA: Diagnosis present

## 2020-10-10 DIAGNOSIS — Y792 Prosthetic and other implants, materials and accessory orthopedic devices associated with adverse incidents: Secondary | ICD-10-CM | POA: Diagnosis present

## 2020-10-10 DIAGNOSIS — Z7901 Long term (current) use of anticoagulants: Secondary | ICD-10-CM | POA: Diagnosis not present

## 2020-10-10 DIAGNOSIS — I4892 Unspecified atrial flutter: Secondary | ICD-10-CM | POA: Diagnosis present

## 2020-10-10 DIAGNOSIS — D631 Anemia in chronic kidney disease: Secondary | ICD-10-CM | POA: Diagnosis present

## 2020-10-10 DIAGNOSIS — T84021A Dislocation of internal left hip prosthesis, initial encounter: Secondary | ICD-10-CM | POA: Diagnosis present

## 2020-10-10 DIAGNOSIS — Z96642 Presence of left artificial hip joint: Secondary | ICD-10-CM | POA: Diagnosis present

## 2020-10-10 DIAGNOSIS — Z7902 Long term (current) use of antithrombotics/antiplatelets: Secondary | ICD-10-CM | POA: Diagnosis not present

## 2020-10-10 DIAGNOSIS — E1122 Type 2 diabetes mellitus with diabetic chronic kidney disease: Secondary | ICD-10-CM | POA: Diagnosis present

## 2020-10-10 DIAGNOSIS — S73005A Unspecified dislocation of left hip, initial encounter: Secondary | ICD-10-CM | POA: Diagnosis not present

## 2020-10-10 DIAGNOSIS — N189 Chronic kidney disease, unspecified: Secondary | ICD-10-CM | POA: Diagnosis not present

## 2020-10-10 DIAGNOSIS — N185 Chronic kidney disease, stage 5: Secondary | ICD-10-CM | POA: Diagnosis present

## 2020-10-10 DIAGNOSIS — T447X6A Underdosing of beta-adrenoreceptor antagonists, initial encounter: Secondary | ICD-10-CM | POA: Diagnosis present

## 2020-10-10 DIAGNOSIS — D638 Anemia in other chronic diseases classified elsewhere: Secondary | ICD-10-CM | POA: Diagnosis present

## 2020-10-10 DIAGNOSIS — Z91128 Patient's intentional underdosing of medication regimen for other reason: Secondary | ICD-10-CM | POA: Diagnosis not present

## 2020-10-10 DIAGNOSIS — I4430 Unspecified atrioventricular block: Secondary | ICD-10-CM | POA: Diagnosis present

## 2020-10-10 DIAGNOSIS — S73005D Unspecified dislocation of left hip, subsequent encounter: Secondary | ICD-10-CM | POA: Diagnosis not present

## 2020-10-10 DIAGNOSIS — Z79899 Other long term (current) drug therapy: Secondary | ICD-10-CM | POA: Diagnosis not present

## 2020-10-10 DIAGNOSIS — I132 Hypertensive heart and chronic kidney disease with heart failure and with stage 5 chronic kidney disease, or end stage renal disease: Secondary | ICD-10-CM | POA: Diagnosis present

## 2020-10-10 DIAGNOSIS — K921 Melena: Secondary | ICD-10-CM | POA: Diagnosis present

## 2020-10-10 DIAGNOSIS — Z6836 Body mass index (BMI) 36.0-36.9, adult: Secondary | ICD-10-CM | POA: Diagnosis not present

## 2020-10-10 DIAGNOSIS — Z23 Encounter for immunization: Secondary | ICD-10-CM | POA: Diagnosis not present

## 2020-10-10 LAB — GLUCOSE, CAPILLARY
Glucose-Capillary: 92 mg/dL (ref 70–99)
Glucose-Capillary: 119 mg/dL — ABNORMAL HIGH (ref 70–99)
Glucose-Capillary: 110 mg/dL — ABNORMAL HIGH (ref 70–99)
Glucose-Capillary: 99 mg/dL (ref 70–99)

## 2020-10-10 LAB — ECHOCARDIOGRAM COMPLETE
AR max vel: 0.98 cm2
AV Area VTI: 1.14 cm2
AV Area mean vel: 1.04 cm2
AV Mean grad: 29.2 mmHg
AV Peak grad: 53.9 mmHg
Ao pk vel: 3.67 m/s
Height: 69 in
S' Lateral: 3.6 cm
Weight: 3985.92 oz

## 2020-10-10 LAB — RENAL FUNCTION PANEL
Albumin: 2.8 g/dL — ABNORMAL LOW (ref 3.5–5.0)
Anion gap: 15 (ref 5–15)
BUN: 112 mg/dL — ABNORMAL HIGH (ref 8–23)
CO2: 15 mmol/L — ABNORMAL LOW (ref 22–32)
Calcium: 11.2 mg/dL — ABNORMAL HIGH (ref 8.9–10.3)
Chloride: 109 mmol/L (ref 98–111)
Creatinine, Ser: 4.92 mg/dL — ABNORMAL HIGH (ref 0.61–1.24)
GFR, Estimated: 13 mL/min — ABNORMAL LOW (ref >60)
Glucose, Bld: 111 mg/dL — ABNORMAL HIGH (ref 70–99)
Phosphorus: 8.2 mg/dL — ABNORMAL HIGH (ref 2.5–4.6)
Potassium: 5.2 mmol/L — ABNORMAL HIGH (ref 3.5–5.1)
Sodium: 139 mmol/L (ref 135–145)

## 2020-10-10 LAB — CBC
HCT: 24.1 % — ABNORMAL LOW (ref 39.0–52.0)
Hemoglobin: 7.2 g/dL — ABNORMAL LOW (ref 13.0–17.0)
MCH: 31.9 pg (ref 26.0–34.0)
MCHC: 29.9 g/dL — ABNORMAL LOW (ref 30.0–36.0)
MCV: 106.6 fL — ABNORMAL HIGH (ref 80.0–100.0)
Platelets: 123 10*3/uL — ABNORMAL LOW (ref 150–400)
RBC: 2.26 MIL/uL — ABNORMAL LOW (ref 4.22–5.81)
RDW: 15.4 % (ref 11.5–15.5)
WBC: 10.7 10*3/uL — ABNORMAL HIGH (ref 4.0–10.5)
nRBC: 0.2 % (ref 0.0–0.2)

## 2020-10-10 LAB — APTT
aPTT: 45 seconds — ABNORMAL HIGH (ref 24–36)
aPTT: 65 seconds — ABNORMAL HIGH (ref 24–36)

## 2020-10-10 LAB — HEPARIN LEVEL (UNFRACTIONATED): Heparin Unfractionated: >2.2 IU/mL — ABNORMAL HIGH (ref 0.30–0.70)

## 2020-10-10 LAB — PREPARE RBC (CROSSMATCH)

## 2020-10-10 MED ORDER — SODIUM CHLORIDE 0.9% IV SOLUTION: Freq: Once | INTRAVENOUS | Status: DC

## 2020-10-10 MED ORDER — FUROSEMIDE 10 MG/ML IJ SOLN
40.0000 mg | Freq: Two times a day (BID) | INTRAMUSCULAR | Status: DC
  Administered 2020-10-11: 40 mg via INTRAVENOUS
  Filled 2020-10-10: qty 4

## 2020-10-10 MED ORDER — APIXABAN 5 MG PO TABS
5.0000 mg | ORAL_TABLET | Freq: Two times a day (BID) | ORAL | Status: DC
  Administered 2020-10-10 – 2020-10-16 (×12): 5 mg via ORAL
  Filled 2020-10-10 (×8): qty 1
  Filled 2020-10-10: qty 2
  Filled 2020-10-10 (×3): qty 1

## 2020-10-10 MED ORDER — FUROSEMIDE 10 MG/ML IJ SOLN
40.0000 mg | Freq: Once | INTRAMUSCULAR | Status: AC
  Administered 2020-10-10: 40 mg via INTRAVENOUS
  Filled 2020-10-10: qty 4

## 2020-10-10 MED ORDER — SODIUM ZIRCONIUM CYCLOSILICATE 5 G PO PACK
5.0000 g | PACK | Freq: Once | ORAL | Status: AC
  Administered 2020-10-10: 5 g via ORAL
  Filled 2020-10-10: qty 1

## 2020-10-10 NOTE — Progress Notes (Signed)
[facility] for Heparin Indication: atrial fibrillation  Allergies  Allergen Reactions  . Penicillins Swelling, Rash and Other (See Comments)    Arms and eyes swell & skin breaks out  Has patient had a PCN reaction causing immediate rash, facial/tongue/throat swelling, SOB or lightheadedness with hypotension: Yes Has patient had a PCN reaction causing severe rash involving mucus membranes or skin necrosis: Yes Has patient had a PCN reaction that required hospitalization: No Has patient had a PCN reaction occurring within the last 10 years: No If all of the above answers are "NO", then may proceed with Cephalosporin use.     Patient Measurements: Height: 5\' 9"  (175.3 cm) Weight: 120.4 kg (265 lb 6.9 oz) IBW/kg (Calculated) : 70.7 Heparin Dosing Weight: 100 kg  Vital Signs: Temp: 97.5 F (36.4 C) (12/03 1120) Temp Source: Oral (12/03 1120) BP: 103/71 (12/03 1120) Pulse Rate: 91 (12/03 1120)  Labs: Recent Labs    10/08/20 1446 10/08/20 1446 10/08/20 2121 10/08/20 2337 10/09/20 0343 10/09/20 1940 10/10/20 0150 10/10/20 1345  HGB 8.0*   < >  --   --  7.8*  --  7.2*  --   HCT 26.8*  --   --   --  25.2*  --  24.1*  --   PLT 181  --   --   --  181  --  123*  --   APTT  --   --   --   --   --  70* 45* 65*  HEPARINUNFRC  --   --   --   --   --   --  >2.20*  --   CREATININE 4.74*  --   --   --  4.62*  --  4.92*  --   CKTOTAL 124  --   --   --   --   --   --   --   TROPONINIHS  --   --  84* 79*  --   --   --   --    < > = values in this interval not displayed.    Estimated Creatinine Clearance: 20.2 mL/min (A) (by C-G formula based on SCr of 4.92 mg/dL (H)).   Medical History: Past Medical History:  Diagnosis Date  . Anemia    labs today indicate (09/27/11)  . Arthritis    right knee  . Bleeding ulcer    hx of, required 6 units blood  . Blood transfusion    6 units with ulcer repair surgery  . Chronic kidney disease    stage  IV, has fistula, never started diaylsis  . Complication of anesthesia    little slow to wake up with last surgery  . Dyspnea    with exertion  . Gout   . Hip dislocation, left ([place]) 10/09/2020  . Hypertension   . Osteoporosis   . Pneumonia     Medications:  No current facility-administered medications on file prior to encounter.   Current Outpatient Medications on File Prior to Encounter  Medication Sig Dispense Refill  . allopurinol (ZYLOPRIM) 300 MG tablet Take 300 mg by mouth 2 (two) times daily.     . cholecalciferol (VITAMIN D) 25 MCG (1000 UNIT) tablet Take 1 tablet (1,000 Units total) by mouth daily. 30 tablet 0  . ferrous sulfate 325 (65 FE) MG tablet Take 1 tablet (325 mg total) by mouth daily with breakfast. 30 tablet 0  . furosemide (LASIX) 40 MG tablet  Take 1 tablet (40 mg total) by mouth 2 (two) times daily. 60 tablet 0  . Multiple Vitamin (MULTIVITAMIN WITH MINERALS) TABS tablet Take 1 tablet by mouth daily after breakfast.    . sodium bicarbonate 650 MG tablet Take 1 tablet (650 mg total) by mouth 3 (three) times daily. 90 tablet 0  . vitamin B-12 (CYANOCOBALAMIN) 1000 MCG tablet Take 1,000 mcg by mouth daily.    Marland Kitchen allopurinol (ZYLOPRIM) 100 MG tablet Take 0.5 tablets (50 mg total) by mouth every Monday, Wednesday, and Friday. (Patient not taking: Reported on 10/08/2020) 6 tablet 0  . apixaban (ELIQUIS) 5 MG TABS tablet Take 1 tablet (5 mg total) by mouth 2 (two) times daily. (Patient not taking: Reported on 10/08/2020) 30 tablet 0  . metoprolol tartrate (LOPRESSOR) 100 MG tablet Take 1 tablet (100 mg total) by mouth 2 (two) times daily. (Patient not taking: Reported on 10/08/2020) 60 tablet 0     Assessment: 59 y.o. male with Aflutter, Eliquis on hold, for heparin.  Eliquis given in ED at midnight 12/1.   APTT this afternoon came back slightly subtherapeutic at 65, on 1750 units/hr. Hgb 7.2 (trending down), plt 123. No s/sx of bleeding or infusion issues.   Goal of  Therapy:  APTT 66-102 sec Heparin level 0.3-0.7 units/ml Monitor platelets by anticoagulation protocol: Yes   Plan:  Increase heparin to 1850 units/hr Order aPTT in 8 hr to ensure in therapeutic range  Monitor daily HL/aPTT until levels correlate, CBC, for s/sx of bleeding   Antonietta Jewel, PharmD, [facility] Pharmacist  Phone: [phone] 10/10/2020 3:02 PM  Please check AMION for all [place] phone numbers After 10:00 PM, call [place] [phone]

## 2020-10-10 NOTE — Progress Notes (Signed)
[facility] for Heparin Indication: atrial fibrillation  Allergies  Allergen Reactions   Penicillins Swelling, Rash and Other (See Comments)    Arms and eyes swell & skin breaks out  Has patient had a PCN reaction causing immediate rash, facial/tongue/throat swelling, SOB or lightheadedness with hypotension: Yes Has patient had a PCN reaction causing severe rash involving mucus membranes or skin necrosis: Yes Has patient had a PCN reaction that required hospitalization: No Has patient had a PCN reaction occurring within the last 10 years: No If all of the above answers are "NO", then may proceed with Cephalosporin use.     Patient Measurements: Height: 5\' 9"  (175.3 cm) Weight: 120.4 kg (265 lb 6.9 oz) IBW/kg (Calculated) : 70.7 Heparin Dosing Weight: 100 kg  Vital Signs: Temp: 98 F (36.7 C) (12/03 0416) Temp Source: Oral (12/03 0416) BP: 107/83 (12/03 0416) Pulse Rate: 80 (12/03 0416)  Labs: Recent Labs    10/08/20 1446 10/08/20 1446 10/08/20 2121 10/08/20 2337 10/09/20 0343 10/09/20 1940 10/10/20 0150  HGB 8.0*   < >  --   --  7.8*  --  7.2*  HCT 26.8*  --   --   --  25.2*  --  24.1*  PLT 181  --   --   --  181  --  123*  APTT  --   --   --   --   --  70* 45*  HEPARINUNFRC  --   --   --   --   --   --  >2.20*  CREATININE 4.74*  --   --   --  4.62*  --  4.92*  CKTOTAL 124  --   --   --   --   --   --   TROPONINIHS  --   --  84* 79*  --   --   --    < > = values in this interval not displayed.    Estimated Creatinine Clearance: 20.2 mL/min (A) (by C-G formula based on SCr of 4.92 mg/dL (H)).   Medical History: Past Medical History:  Diagnosis Date   Anemia    labs today indicate (09/27/11)   Arthritis    right knee   Bleeding ulcer    hx of, required 6 units blood   Blood transfusion    6 units with ulcer repair surgery   Chronic kidney disease    stage IV, has fistula, never started diaylsis   Complication  of anesthesia    little slow to wake up with last surgery   Dyspnea    with exertion   Gout    Hip dislocation, left ([place]) 10/09/2020   Hypertension    Osteoporosis    Pneumonia     Medications:  No current facility-administered medications on file prior to encounter.   Current Outpatient Medications on File Prior to Encounter  Medication Sig Dispense Refill   allopurinol (ZYLOPRIM) 300 MG tablet Take 300 mg by mouth 2 (two) times daily.      cholecalciferol (VITAMIN D) 25 MCG (1000 UNIT) tablet Take 1 tablet (1,000 Units total) by mouth daily. 30 tablet 0   ferrous sulfate 325 (65 FE) MG tablet Take 1 tablet (325 mg total) by mouth daily with breakfast. 30 tablet 0   furosemide (LASIX) 40 MG tablet Take 1 tablet (40 mg total) by mouth 2 (two) times daily. 60 tablet 0   Multiple Vitamin (MULTIVITAMIN WITH MINERALS) TABS tablet  Take 1 tablet by mouth daily after breakfast.     sodium bicarbonate 650 MG tablet Take 1 tablet (650 mg total) by mouth 3 (three) times daily. 90 tablet 0   vitamin B-12 (CYANOCOBALAMIN) 1000 MCG tablet Take 1,000 mcg by mouth daily.     allopurinol (ZYLOPRIM) 100 MG tablet Take 0.5 tablets (50 mg total) by mouth every Monday, Wednesday, and Friday. (Patient not taking: Reported on 10/08/2020) 6 tablet 0   apixaban (ELIQUIS) 5 MG TABS tablet Take 1 tablet (5 mg total) by mouth 2 (two) times daily. (Patient not taking: Reported on 10/08/2020) 30 tablet 0   metoprolol tartrate (LOPRESSOR) 100 MG tablet Take 1 tablet (100 mg total) by mouth 2 (two) times daily. (Patient not taking: Reported on 10/08/2020) 60 tablet 0     Assessment: 59 y.o. male with Aflutter, Eliquis on hold, for heparin.  Eliquis given in ED at midnight 12/1.   Initial aptt at goal (70s), hgb this am low at 7.8 but appears stable from previous draw. No bleeding issues noted. Will continue to monitor and dose adjust based on aptt's until apixaban is cleared.   12/3 AM update:  APTT  low this AM Noted drop in Hgb-no bleeding per RN  Goal of Therapy:  APTT 66-102 sec Heparin level 0.3-0.7 units/ml Monitor platelets by anticoagulation protocol: Yes   Plan:  Inc heparin to 1750 units/hr Re-check aPTT at [place], PharmD, [facility] Pharmacist Phone: [phone]

## 2020-10-10 NOTE — Progress Notes (Addendum)
Progress Note  Patient Name: Mark [name] Date of Encounter: 10/10/2020  [facility] Cardiologist: Dr. Margaretann Loveless (remotly seen Dr. Gwenlyn Found)  Subjective   No chest pain or palpitations   Inpatient Medications    Scheduled Meds: . allopurinol  50 mg Oral Q M,W,F  . ferrous sulfate  325 mg Oral Q breakfast  . furosemide  40 mg Oral BID  . insulin aspart  0-6 Units Subcutaneous TID WC  . metoprolol succinate  50 mg Oral Daily  . sodium bicarbonate  650 mg Oral TID  . vitamin B-12  1,000 mcg Oral Daily   Continuous Infusions: . diltiazem (CARDIZEM) infusion Stopped (10/09/20 1404)  . heparin 1,750 Units/hr (10/10/20 0501)   PRN Meds: acetaminophen **OR** acetaminophen   Vital Signs    Vitals:   10/10/20 0012 10/10/20 0416 10/10/20 0716 10/10/20 1120  BP: 107/83 107/83 98/74 103/71  Pulse: 80 80 73 91  Resp: 19 20 20 20   Temp: 98 F (36.7 C) 98 F (36.7 C) 97.9 F (36.6 C) (!) 97.5 F (36.4 C)  TempSrc:  Oral Oral Oral  SpO2:  100% 100% 92%  Weight:  120.4 kg    Height:        Intake/Output Summary (Last 24 hours) at 10/10/2020 1202 Last data filed at 10/10/2020 0600 Gross per 24 hour  Intake 547.52 ml  Output 1350 ml  Net -802.48 ml   Last 3 Weights 10/10/2020 10/08/2020 09/11/2020  Weight (lbs) 265 lb 6.9 oz 249 lb 1.9 oz 250 lb  Weight (kg) 120.4 kg 113 kg 113.399 kg      Telemetry    Atrial flutter at 100s- Personally Reviewed  ECG    N/A  Physical Exam   GEN: No acute distress.   Neck: No JVD Cardiac: Regular tachycardic , no murmurs, rubs, or gallops.  Respiratory: Clear to auscultation bilaterally. GI: Soft, nontender, non-distended  MS: No edema; No deformity. Neuro:  Nonfocal  Psych: Normal affect   Labs    High Sensitivity Troponin:   Recent Labs  Lab 10/08/20 2121 10/08/20 2337  TROPONINIHS 84* 79*      Chemistry Recent Labs  Lab 10/08/20 1446 10/09/20 0343 10/10/20 0150  NA 144 143 139  K 4.9 5.4* 5.2*  CL 113* 111  109  CO2 14* 16* 15*  GLUCOSE 68* 113* 111*  BUN 110* 106* 112*  CREATININE 4.74* 4.62* 4.92*  CALCIUM 11.0* 11.4* 11.2*  ALBUMIN  --   --  2.8*  GFRNONAA 13* 14* 13*  ANIONGAP 17* 16* 15     Hematology Recent Labs  Lab 10/08/20 1446 10/09/20 0343 10/10/20 0150  WBC 8.7 9.0 10.7*  RBC 2.51* 2.41* 2.26*  HGB 8.0* 7.8* 7.2*  HCT 26.8* 25.2* 24.1*  MCV 106.8* 104.6* 106.6*  MCH 31.9 32.4 31.9  MCHC 29.9* 31.0 29.9*  RDW 15.2 15.5 15.4  PLT 181 181 123*    Radiology    CT Head Wo Contrast  Result Date: 10/08/2020 CLINICAL DATA:  Fall, moderate to severe. EXAM: CT HEAD WITHOUT CONTRAST TECHNIQUE: Contiguous axial images were obtained from the base of the skull through the vertex without intravenous contrast. COMPARISON:  07/26/2020 head CT. FINDINGS: Brain: No evidence of parenchymal hemorrhage or extra-axial fluid collection. No mass lesion, mass effect, or midline shift. No CT evidence of acute infarction. Nonspecific mild subcortical and periventricular white matter hypodensity, most in keeping with chronic small vessel ischemic change. Generalized cerebral volume loss. No ventriculomegaly. Vascular: No acute abnormality.  Skull: No evidence of calvarial fracture. Sinuses/Orbits: The visualized paranasal sinuses are essentially clear. Other:  The mastoid air cells are unopacified. IMPRESSION: 1. No evidence of acute intracranial abnormality. No evidence of calvarial fracture. 2. Generalized cerebral volume loss and mild chronic small vessel ischemic changes in the cerebral white matter. Electronically Signed   By: Ilona Sorrel M.D.   On: 10/08/2020 15:35   DG CHEST PORT 1 VIEW  Result Date: 10/09/2020 CLINICAL DATA:  Shortness of breath EXAM: PORTABLE CHEST 1 VIEW COMPARISON:  07/27/2020 FINDINGS: Cardiac shadow is enlarged stable. Lungs are well aerated bilaterally. Mild central vascular congestion is noted without interstitial edema. No focal infiltrate is noted. Rotation of the  patient to the right accentuates the mediastinal markings. IMPRESSION: Increased vascular congestion without edema greater no other focal abnormality is noted. Electronically Signed   By: Inez Catalina M.D.   On: 10/09/2020 00:36   ECHOCARDIOGRAM COMPLETE  Result Date: 10/10/2020    ECHOCARDIOGRAM REPORT   Patient Name:   Mark [name] Date of Exam: 10/09/2020 Medical Rec #:  [id number]        Height:       69.0 in Accession #:    [id number]       Weight:       249.1 lb Date of Birth:  August 02, 1961        BSA:          2.268 m Patient Age:    59 years         BP:           113/70 mmHg Patient Gender: M                HR:           74 bpm. Exam Location:  Inpatient Procedure: 2D Echo, Cardiac Doppler, Color Doppler and Intracardiac            Opacification Agent                             MODIFIED REPORT: This report was modified by Kirk Ruths MD on 10/10/2020 due to Change.  Indications:     Atrial fibrillation  History:         Patient has prior history of Echocardiogram examinations, most                  recent 07/27/2020. Signs/Symptoms:Shortness of Breath; Risk                  Factors:Hypertension. CKD.  Sonographer:     Clayton Lefort RDCS (AE) Referring Phys:  [place] Diagnosing Phys: Kirk Ruths MD  Sonographer Comments: Technically challenging study due to limited acoustic windows, Technically difficult study due to poor echo windows, patient is morbidly obese and no subcostal window. Image acquisition challenging due to respiratory motion. Imaging  windows limited by belt device surrounding abdomen in apical and subcostal areas. IMPRESSIONS  1. No subcostal views.  2. Left ventricular ejection fraction, by estimation, is 60 to 65%. The left ventricle has normal function. The left ventricle has no regional wall motion abnormalities. There is mild left ventricular hypertrophy. Left ventricular diastolic parameters are indeterminate.  3. Right ventricular systolic function is normal. The  right ventricular size is normal.  4. Left atrial size was severely dilated.  5. The mitral valve is normal in structure. Mild mitral valve regurgitation. No evidence of mitral stenosis. Severe  mitral annular calcification.  6. The aortic valve is normal in structure. Aortic valve regurgitation is trivial. Moderate aortic valve stenosis. FINDINGS  Left Ventricle: Left ventricular ejection fraction, by estimation, is 60 to 65%. The left ventricle has normal function. The left ventricle has no regional wall motion abnormalities. Definity contrast agent was given IV to delineate the left ventricular  endocardial borders. The left ventricular internal cavity size was normal in size. There is mild left ventricular hypertrophy. Left ventricular diastolic parameters are indeterminate. Right Ventricle: The right ventricular size is normal. Right ventricular systolic function is normal. Left Atrium: Left atrial size was severely dilated. Right Atrium: Right atrial size was normal in size. Pericardium: There is no evidence of pericardial effusion. Mitral Valve: The mitral valve is normal in structure. Severe mitral annular calcification. Mild mitral valve regurgitation. No evidence of mitral valve stenosis. Tricuspid Valve: The tricuspid valve is normal in structure. Tricuspid valve regurgitation is trivial. No evidence of tricuspid stenosis. Aortic Valve: The aortic valve is normal in structure. Aortic valve regurgitation is trivial. Moderate aortic stenosis is present. Aortic valve mean gradient measures 29.2 mmHg. Aortic valve peak gradient measures 53.9 mmHg. Aortic valve area, by VTI measures 1.14 cm. Pulmonic Valve: The pulmonic valve was not well visualized. Pulmonic valve regurgitation is not visualized. No evidence of pulmonic stenosis. Aorta: The aortic root was not well visualized. Venous: The inferior vena cava was not well visualized.  Additional Comments: No subcostal views.  LEFT VENTRICLE PLAX 2D LVIDd:          4.70 cm LVIDs:         3.60 cm LV PW:         1.40 cm LV IVS:        1.30 cm LVOT diam:     2.00 cm LV SV:         90 LV SV Index:   40 LVOT Area:     3.14 cm  RIGHT VENTRICLE RV Basal diam:  3.30 cm RV S prime:     12.80 cm/s TAPSE (M-mode): 1.7 cm LEFT ATRIUM              Index       RIGHT ATRIUM           Index LA diam:        5.10 cm  2.25 cm/m  RA Area:     31.30 cm LA Vol (A2C):   152.0 ml 67.02 ml/m RA Volume:   113.00 ml 49.82 ml/m LA Vol (A4C):   226.0 ml 99.64 ml/m LA Biplane Vol: 186.0 ml 82.01 ml/m  AORTIC VALVE AV Area (Vmax):    0.98 cm AV Area (Vmean):   1.04 cm AV Area (VTI):     1.14 cm AV Vmax:           367.20 cm/s AV Vmean:          246.200 cm/s AV VTI:            0.786 m AV Peak Grad:      53.9 mmHg AV Mean Grad:      29.2 mmHg LVOT Vmax:         114.00 cm/s LVOT Vmean:        81.560 cm/s LVOT VTI:          0.285 m LVOT/AV VTI ratio: 0.36  AORTA Ao Root diam: 3.50 cm TRICUSPID VALVE TR Peak grad:   21.3 mmHg TR Vmax:  231.00 cm/s  SHUNTS Systemic VTI:  0.29 m Systemic Diam: 2.00 cm Kirk Ruths MD Electronically signed by Kirk Ruths MD Signature Date/Time: 10/09/2020/1:54:43 PM    Final (Updated)    DG Hip Port Unilat W or [place] Pelvis 1 View Left  Result Date: 10/08/2020 CLINICAL DATA:  Status post reduction EXAM: DG HIP (WITH OR WITHOUT PELVIS) 1V PORT LEFT COMPARISON:  Film from earlier in the same day. FINDINGS: Left hip prosthesis has now been reduced into the acetabular component. No acute fracture is seen. No soft tissue abnormality is noted. IMPRESSION: Status post reduction. Electronically Signed   By: Inez Catalina M.D.   On: 10/08/2020 19:55   DG Hip Port Unilat W or Wo Pelvis 1 View Left  Result Date: 10/08/2020 CLINICAL DATA:  59 year old male status post reduction of left hip dislocation. EXAM: DG HIP (WITH OR WITHOUT PELVIS) 1V PORT LEFT COMPARISON:  Earlier radiograph dated 10/08/2020. FINDINGS: Persistent superior dislocation of the left hip  arthroplasty with femoral component superior to the acetabular cup. There is probable impaction of the femoral component on the superior aspect of the acetabular cup. No acute fracture. The bones are osteopenic. IMPRESSION: Persistent superior dislocation of the left hip arthroplasty with possible impaction. Electronically Signed   By: Anner Crete M.D.   On: 10/08/2020 18:32   DG Hip Unilat W or Wo Pelvis 2-3 Views Left  Result Date: 10/08/2020 CLINICAL DATA:  Left hip arthroplasty dislocation. EXAM: DG HIP (WITH OR WITHOUT PELVIS) 2-3V LEFT COMPARISON:  09/11/2020 FINDINGS: The femoral component of the left hip arthroplasty has dislocated superior to the acetabular component. No acute fracture. No evidence prosthesis component loosening. IMPRESSION: Dislocated left total hip arthroplasty. Electronically Signed   By: Lajean Manes M.D.   On: 10/08/2020 15:05    Cardiac Studies   Echo 10/09/2020 1. No subcostal views.  2. Left ventricular ejection fraction, by estimation, is 60 to 65%. The  left ventricle has normal function. The left ventricle has no regional  wall motion abnormalities. There is mild left ventricular hypertrophy.  Left ventricular diastolic parameters  are indeterminate.  3. Right ventricular systolic function is normal. The right ventricular  size is normal.  4. Left atrial size was severely dilated.  5. The mitral valve is normal in structure. Mild mitral valve  regurgitation. No evidence of mitral stenosis. Severe mitral annular  calcification.  6. The aortic valve is normal in structure. Aortic valve regurgitation is  trivial. Moderate aortic valve stenosis.   Patient Profile     59 y.o. male with a hx of atrial flutter during a hospitalization 07/2020 (cariology curb-sided), CKD Stage V follows with nephrology, anemia, HTN and gout who is here with left hip dislocation and cardiology is being seen  for the evaluation of recurrent atrial flutter with RVR at the  request of Dr. Hal Hope.   Assessment & Plan    1. Atrial flutter with RVR - Hx of aflutter 07/2020>>on metoprolol and Eliquis (only took for one month due to lack of refills) for anticoagulation with a plan for cardioversion as an outpatient.   - Noted aflutter with RVR on admit and treated with IV cardizem with improved rate.  - Echo with preserved LVEF without WM abnormality - Currently on Topxol XL 50mg  qd and off of IV cardizem >> HR in 100s - Plan was for outpatient cardioversion after 3 weeks of anticoagulation however pt told me that there may be hip surgery in 4-6 weeks after discharge. Pt  needs 4 weeks ago anticoagulation post cardioversion as well.  - Will timing of cardioversion with MD options are 1. TEE/cardioverion while admitted 2.  Cardioversion after 3 weeks of anticoagulation and then wait another 4 weeks for surgery  3. Surgery first (will need clearance for holding) then cardioversion after 3 weeks of anticoagulation - pharmacy to help with cost of Eliquis  Otherwise per primary team   Appointment made with Dr. Margaretann Loveless 11/11/2020 @ 1:40 pm.   For questions or updates, please contact [place] Please consult [email or web address] for contact info under      Jarrett Soho, PA  10/10/2020, 12:02 PM    Patient seen and examined with [facility] PA.  Agree as above, with the following exceptions and changes as noted below. No CP/palp. Gen: NAD, CV: irregular, normal rate., no murmurs, Lungs: clear, Abd: soft, Extrem: Warm, well perfused, no edema, Neuro/Psych: alert and oriented x 3, normal mood and affect. All available labs, radiology testing, previous records reviewed. Will plan on outpatient follow up and cardioversion as an outpatient. There does not appear to be an urgency to cardioversion, though would be optimal to evaluate as outpatient prior to surgery. He will need to take 4 weeks of uninterrupted anticoagulation before AND after cardioversion. I would  be happy to follow up with patient as outpatient, but we will also arrange afib clinic follow up. Continue metoprolol, he appears well rate controlled. Continue eliquis, pharmacy helping with cost.   [name], MD 10/10/20 8:11 PM

## 2020-10-10 NOTE — Consult Note (Signed)
WOC Nurse Consult Note: Patient receiving care in [facility] 4E10 Reason for Consult: Right upper thigh wound Wound type: MASD to right posterior upper thigh Pressure Injury POA: NA Measurement: 1 cm x 1 cm  Wound bed: Pink moist Drainage (amount, consistency, odor) None Periwound: Intact Dressing procedure/placement/frequency: Place small piece of Xeroform gauze Kellie Simmering # 294) on upper right thigh wound. Secure with small foam dressing. Change daily.   Monitor the wound area(s) for worsening of condition such as: Signs/symptoms of infection, increase in size, development of or worsening of odor, development of pain, or increased pain at the affected locations.   Notify the medical team if any of these develop.  Thank you for the consult. [place] nurse will not follow at this time.   Please re-consult the [place] team if needed.  Cathlean Marseilles Tamala Julian, MSN, RN, [place], Lysle Pearl, [facility] Wound Treatment Associate Pager [phone]

## 2020-10-10 NOTE — Evaluation (Signed)
Physical Therapy Evaluation Patient Details Name: Mark [name] MRN: [id number] DOB: [birth year] Today's Date: 10/10/2020   History of Present Illness  Pt is a 59 y.o. male admitted for L hip dislocation. PMH consists of recurrent L hip dislocation, h/o L THA 2014, a flutter, CKD, HTN, osteoporosis, tobacco use, and arthritis.    Clinical Impression  Pt admitted with above diagnosis. PTA pt lived at home alone, mod I mobility using RW. On eval, pt required min assist bed mobility, min guard assist transfers, and min guard assist ambulation 20' with RW. L hip abduction brace in place during session. Pt currently with functional limitations due to the deficits listed below. Pt will benefit from skilled PT to increase their independence and safety with mobility to allow discharge to the venue listed below.       Follow Up Recommendations Home health PT;Supervision for mobility/OOB    Equipment Recommendations  None recommended by PT    Recommendations for Other Services       Precautions / Restrictions Precautions Precautions: Fall;Posterior Hip Precaution Comments: strict posterior hip precautions Required Braces or Orthoses: Other Brace Other Brace: hinged abduction brace Restrictions Weight Bearing Restrictions: Yes LLE Weight Bearing: Weight bearing as tolerated      Mobility  Bed Mobility Overal bed mobility: Needs Assistance Bed Mobility: Supine to Sit;Sit to Supine     Supine to sit: Modified independent (Device/Increase time);HOB elevated Sit to supine: Min assist;HOB elevated   General bed mobility comments: +rail, assist with LLE back to bed    Transfers Overall transfer level: Needs assistance Equipment used: Rolling walker (2 wheeled) Transfers: Sit to/from Stand Sit to Stand: Min guard         General transfer comment: increased time  Ambulation/Gait Ambulation/Gait assistance: Min guard Gait Distance (Feet): 20 Feet Assistive device: Rolling  walker (2 wheeled) Gait Pattern/deviations: Step-to pattern;Decreased stride length Gait velocity: decreased Gait velocity interpretation: <1.31 ft/sec, indicative of household ambulator General Gait Details: steady gait with RW  Stairs            Wheelchair Mobility    Modified Rankin (Stroke Patients Only)       Balance Overall balance assessment: Needs assistance Sitting-balance support: No upper extremity supported;Feet supported Sitting balance-Leahy Scale: Good     Standing balance support: Bilateral upper extremity supported;During functional activity Standing balance-Leahy Scale: Fair Standing balance comment: static stand without UE support. RW for amb.                             Pertinent Vitals/Pain Pain Assessment: Faces Faces Pain Scale: Hurts a little bit Pain Location: L hip Pain Descriptors / Indicators: Discomfort Pain Intervention(s): Monitored during session;Repositioned    Home Living Family/patient expects to be discharged to:: Private residence Living Arrangements: Alone (Pt's brother has been staying with him since his fall.) Available Help at Discharge: Family;Available PRN/intermittently Type of Home: House Home Access: Stairs to enter Entrance Stairs-Rails: None Entrance Stairs-Number of Steps: 3 Home Layout: One level Home Equipment: Grab bars - tub/shower;Shower seat;Cane - quad;Cane - single point;Walker - 2 wheels;Bedside commode      Prior Function Level of Independence: Independent with assistive device(s)         Comments: RW for ambulation.     Hand Dominance   Dominant Hand: Right    Extremity/Trunk Assessment   Upper Extremity Assessment Upper Extremity Assessment: Overall WFL for tasks assessed    Lower Extremity Assessment  Lower Extremity Assessment: Generalized weakness    Cervical / Trunk Assessment Cervical / Trunk Assessment: Normal  Communication   Communication: No difficulties   Cognition Arousal/Alertness: Awake/alert Behavior During Therapy: WFL for tasks assessed/performed Overall Cognitive Status: Within Functional Limits for tasks assessed                                        General Comments General comments (skin integrity, edema, etc.): Pt on 1L O2 on arrival, SpO2 93%. O2 removed and SpO2 improved to 96% on RA. Amb on RA, SpO2 90%. 1/4 DOE.    Exercises     Assessment/Plan    PT Assessment Patient needs continued PT services  PT Problem List Decreased mobility;Decreased knowledge of precautions;Decreased balance;Pain       PT Treatment Interventions Therapeutic activities;Gait training;Therapeutic exercise;Patient/family education;Balance training;Stair training;Functional mobility training    PT Goals (Current goals can be found in the Care Plan section)  Acute Rehab PT Goals Patient Stated Goal: home PT Goal Formulation: With patient Time For Goal Achievement: 10/24/20 Potential to Achieve Goals: Good    Frequency Min 5X/week   Barriers to discharge        Co-evaluation               AM-PAC PT "6 Clicks" Mobility  Outcome Measure Help needed turning from your back to your side while in a flat bed without using bedrails?: None Help needed moving from lying on your back to sitting on the side of a flat bed without using bedrails?: A Little Help needed moving to and from a bed to a chair (including a wheelchair)?: A Little Help needed standing up from a chair using your arms (e.g., wheelchair or bedside chair)?: A Little Help needed to walk in hospital room?: A Little Help needed climbing 3-5 steps with a railing? : A Lot 6 Click Score: 18    End of Session   Activity Tolerance: Patient tolerated treatment well Patient left: in chair;with call bell/phone within reach Nurse Communication: Mobility status;Other (comment) (Alarm pad in chair but no alarm box in room.) PT Visit Diagnosis: Difficulty in walking,  not elsewhere classified (R26.2)    Time: 9678-9381 PT Time Calculation (min) (ACUTE ONLY): 15 min   Charges:   PT Evaluation $PT Eval Moderate Complexity: 1 Mod          Lorrin Goodell, PT  Office # [phone] Pager [phone]   Lorriane Shire 10/10/2020, 10:13 AM

## 2020-10-10 NOTE — Plan of Care (Signed)
  Problem: Education: Goal: Knowledge of General Education information will improve Description: Including pain rating scale, medication(s)/side effects and non-pharmacologic comfort measures Outcome: Progressing   Problem: Clinical Measurements: Goal: Ability to maintain clinical measurements within normal limits will improve Outcome: Progressing Goal: Will remain free from infection Outcome: Progressing Goal: Diagnostic test results will improve Outcome: Progressing Goal: Respiratory complications will improve Outcome: Progressing Goal: Cardiovascular complication will be avoided Outcome: Progressing   Problem: Pain Managment: Goal: General experience of comfort will improve Outcome: Progressing   Problem: Safety: Goal: Ability to remain free from injury will improve Outcome: Progressing   

## 2020-10-10 NOTE — Progress Notes (Signed)
PROGRESS NOTE    Mark [name]  VZD:[id number] DOB: 1960/12/25 DOA: 10/08/2020 PCP: Delrae Rend, MD    Brief Narrative:  59 year old male with history of atrial flutter, chronic kidney disease stage V not on dialysis, chronic anemia, previous left hip surgery, presents to the emergency room with left hip pain.  Imaging revealed left hip dislocation.  He was also noted to be tachycardic in rapid atrial flutter.  He was seen by orthopedic and hip was reduced.  Started on Cardizem infusion.  Cardiology following.   Assessment & Plan:   Principal Problem:   Atrial flutter with rapid ventricular response (HCC) Active Problems:   DM (diabetes mellitus) ([place])   Closed dislocation of left hip (HCC)   CKD (chronic kidney disease), stage IV (HCC)   Atrial flutter (HCC)   Atrial flutter with RVR.   -Patient was unable to take metoprolol at home -Started on Cardizem infusion in the hospital -Also started on Toprol -Overall heart rate appears to be improving -He has been weaned off cardizem -will change heparin infusion to eliquis -plans are for outpatient cardiology follow up and cardioversion next month  Recurrent left hip dislocation -Seen by orthopedics and underwent reduction -We will need outpatient follow-up to discuss further operative management -Currently in brace -Deferred weightbearing status to Ortho -seen by physical therapy with recommendations for HHPT -will need to ask PT to re evaluate, since patient reports difficulty getting out of bed, and he does not have good support system at home  Chronic anemia -Likely related to kidney disease -Hemoglobin down to 7.2 -will transfuse 1 unit prbc since this may also help with his shortness of breath  Chronic kidney disease stage IV-V -Patient had AV fistula placed several years ago, although he has not undergone dialysis -Creatinine appears to be near baseline -Continue to monitor  Hyperkalemia -Received a dose of  Lokelma  Metabolic acidosis -Likely related to renal failure -He is on chronic oral bicarbonate replacement  Acute on chronic diastolic congestive heart failure -He is noted to have some congestion on chest x-ray -Does feel short of breath and is on supplemental oxygen -1+ lower extremity edema bilaterally -continue on IV lasix -will likely need to tolerate higher BUN  Diabetes type 2 -He is not on any chronic medications -Blood sugars have been stable -Continue on sliding scale  DVT prophylaxis: heparin infusion  Code Status: full code Family Communication: discussed with patient Disposition Plan: Status is: Observation  The patient will require care spanning > 2 midnights and should be moved to inpatient because: Ongoing active pain requiring inpatient pain management and Ongoing diagnostic testing needed not appropriate for outpatient work up  Dispo: The patient is from: Home              Anticipated d/c is to: TBD              Anticipated d/c date is: 2 days              Patient currently is not medically stable to d/c.    Consultants:   Ortho, Dr. Mayer Camel  Cardiology  Procedures:   Reduction of dislocation of left hip  Antimicrobials:      Subjective: Feels that breathing is improving. Reports noting intermittent blood in stool when he wipes, but has not noted any dark stools or blood mixed in stool  Objective: Vitals:   10/10/20 1120 10/10/20 1737 10/10/20 1840 10/10/20 1855  BP: 103/71 99/75 90/77  94/65  Pulse: 91 78 97  86  Resp: 20 20 18 18   Temp: (!) 97.5 F (36.4 C) 98 F (36.7 C) (!) 97.4 F (36.3 C) 98 F (36.7 C)  TempSrc: Oral Oral Oral   SpO2: 92% 99% 100%   Weight:      Height:        Intake/Output Summary (Last 24 hours) at 10/10/2020 1949 Last data filed at 10/10/2020 1847 Gross per 24 hour  Intake 1092.21 ml  Output 1550 ml  Net -457.79 ml   Filed Weights   10/08/20 1349 10/10/20 0416  Weight: 113 kg 120.4 kg     Examination:  General exam: Alert, awake, oriented x 3 Respiratory system: crackles at bases. Seems to become short of breath in conversation Cardiovascular system:irregular. No murmurs, rubs, gallops. Gastrointestinal system: Abdomen is nondistended, soft and nontender. No organomegaly or masses felt. Normal bowel sounds heard. Central nervous system: Alert and oriented. No focal neurological deficits. Extremities: LLE in brace, 1+ LE edema bilaterally Skin: No rashes, lesions or ulcers Psychiatry: Judgement and insight appear normal. Mood & affect appropriate.   Data Reviewed: I have personally reviewed following labs and imaging studies  CBC: Recent Labs  Lab 10/08/20 1446 10/09/20 0343 10/10/20 0150  WBC 8.7 9.0 10.7*  NEUTROABS 7.0  --   --   HGB 8.0* 7.8* 7.2*  HCT 26.8* 25.2* 24.1*  MCV 106.8* 104.6* 106.6*  PLT 181 181 902*   Basic Metabolic Panel: Recent Labs  Lab 10/08/20 1446 10/09/20 0343 10/10/20 0150  NA 144 143 139  K 4.9 5.4* 5.2*  CL 113* 111 109  CO2 14* 16* 15*  GLUCOSE 68* 113* 111*  BUN 110* 106* 112*  CREATININE 4.74* 4.62* 4.92*  CALCIUM 11.0* 11.4* 11.2*  PHOS  --   --  8.2*   GFR: Estimated Creatinine Clearance: 20.2 mL/min (A) (by C-G formula based on SCr of 4.92 mg/dL (H)). Liver Function Tests: Recent Labs  Lab 10/10/20 0150  ALBUMIN 2.8*   No results for input(s): LIPASE, AMYLASE in the last 168 hours. No results for input(s): AMMONIA in the last 168 hours. Coagulation Profile: No results for input(s): INR, PROTIME in the last 168 hours. Cardiac Enzymes: Recent Labs  Lab 10/08/20 1446  CKTOTAL 124   BNP (last 3 results) No results for input(s): PROBNP in the last 8760 hours. HbA1C: No results for input(s): HGBA1C in the last 72 hours. CBG: Recent Labs  Lab 10/09/20 1611 10/09/20 2125 10/10/20 0605 10/10/20 1131 10/10/20 1639  GLUCAP 100* 222* 92 119* 110*   Lipid Profile: No results for input(s): CHOL, HDL,  LDLCALC, TRIG, CHOLHDL, LDLDIRECT in the last 72 hours. Thyroid Function Tests: Recent Labs    10/08/20 2233  TSH 1.804   Anemia Panel: No results for input(s): VITAMINB12, FOLATE, FERRITIN, TIBC, IRON, RETICCTPCT in the last 72 hours. Sepsis Labs: No results for input(s): PROCALCITON, LATICACIDVEN in the last 168 hours.  Recent Results (from the past 240 hour(s))  Resp Panel by RT-PCR (Flu A&B, Covid) Nasopharyngeal Swab     Status: None   Collection Time: 10/08/20  6:19 PM   Specimen: Nasopharyngeal Swab; Nasopharyngeal(NP) swabs in vial transport medium  Result Value Ref Range Status   SARS Coronavirus 2 by RT PCR NEGATIVE NEGATIVE Final    Comment: (NOTE) SARS-CoV-2 target nucleic acids are NOT DETECTED.  The SARS-CoV-2 RNA is generally detectable in upper respiratory specimens during the acute phase of infection. The lowest concentration of SARS-CoV-2 viral copies this assay can detect is 138  copies/mL. A negative result does not preclude SARS-Cov-2 infection and should not be used as the sole basis for treatment or other patient management decisions. A negative result may occur with  improper specimen collection/handling, submission of specimen other than nasopharyngeal swab, presence of viral mutation(s) within the areas targeted by this assay, and inadequate number of viral copies(<138 copies/mL). A negative result must be combined with clinical observations, patient history, and epidemiological information. The expected result is Negative.  Fact Sheet for Patients:  EntrepreneurPulse.com.au  Fact Sheet for Healthcare Providers:  IncredibleEmployment.be  This test is no t yet approved or cleared by the [place] FDA and  has been authorized for detection and/or diagnosis of SARS-CoV-2 by FDA under an Emergency Use Authorization (EUA). This EUA will remain  in effect (meaning this test can be used) for the duration of  the COVID-19 declaration under Section 564(b)(1) of the Act, 21 U.S.C.section 360bbb-3(b)(1), unless the authorization is terminated  or revoked sooner.       Influenza A by PCR NEGATIVE NEGATIVE Final   Influenza B by PCR NEGATIVE NEGATIVE Final    Comment: (NOTE) The Xpert Xpress SARS-CoV-2/FLU/RSV plus assay is intended as an aid in the diagnosis of influenza from Nasopharyngeal swab specimens and should not be used as a sole basis for treatment. Nasal washings and aspirates are unacceptable for Xpert Xpress SARS-CoV-2/FLU/RSV testing.  Fact Sheet for Patients: EntrepreneurPulse.com.au  Fact Sheet for Healthcare Providers: IncredibleEmployment.be  This test is not yet approved or cleared by the [place] FDA and has been authorized for detection and/or diagnosis of SARS-CoV-2 by FDA under an Emergency Use Authorization (EUA). This EUA will remain in effect (meaning this test can be used) for the duration of the COVID-19 declaration under Section 564(b)(1) of the Act, 21 U.S.C. section 360bbb-3(b)(1), unless the authorization is terminated or revoked.  Performed at [facility], [place] [address], [place],  [address]          Radiology Studies: DG CHEST PORT 1 VIEW  Result Date: 10/09/2020 CLINICAL DATA:  Shortness of breath EXAM: PORTABLE CHEST 1 VIEW COMPARISON:  07/27/2020 FINDINGS: Cardiac shadow is enlarged stable. Lungs are well aerated bilaterally. Mild central vascular congestion is noted without interstitial edema. No focal infiltrate is noted. Rotation of the patient to the right accentuates the mediastinal markings. IMPRESSION: Increased vascular congestion without edema greater no other focal abnormality is noted. Electronically Signed   By: Inez Catalina M.D.   On: 10/09/2020 00:36   ECHOCARDIOGRAM COMPLETE  Result Date: 10/10/2020    ECHOCARDIOGRAM REPORT   Patient Name:   Mark [name] Date of Exam:  10/09/2020 Medical Rec #:  [id number]        Height:       69.0 in Accession #:    [id number]       Weight:       249.1 lb Date of Birth:  10-Mar-1961        BSA:          2.268 m Patient Age:    65 years         BP:           113/70 mmHg Patient Gender: M                HR:           74 bpm. Exam Location:  Inpatient Procedure: 2D Echo, Cardiac Doppler, Color Doppler and Intracardiac  Opacification Agent                             MODIFIED REPORT: This report was modified by Kirk Ruths MD on 10/10/2020 due to Change.  Indications:     Atrial fibrillation  History:         Patient has prior history of Echocardiogram examinations, most                  recent 07/27/2020. Signs/Symptoms:Shortness of Breath; Risk                  Factors:Hypertension. CKD.  Sonographer:     Clayton Lefort RDCS (AE) Referring Phys:  [place] Diagnosing Phys: Kirk Ruths MD  Sonographer Comments: Technically challenging study due to limited acoustic windows, Technically difficult study due to poor echo windows, patient is morbidly obese and no subcostal window. Image acquisition challenging due to respiratory motion. Imaging  windows limited by belt device surrounding abdomen in apical and subcostal areas. IMPRESSIONS  1. No subcostal views.  2. Left ventricular ejection fraction, by estimation, is 60 to 65%. The left ventricle has normal function. The left ventricle has no regional wall motion abnormalities. There is mild left ventricular hypertrophy. Left ventricular diastolic parameters are indeterminate.  3. Right ventricular systolic function is normal. The right ventricular size is normal.  4. Left atrial size was severely dilated.  5. The mitral valve is normal in structure. Mild mitral valve regurgitation. No evidence of mitral stenosis. Severe mitral annular calcification.  6. The aortic valve is normal in structure. Aortic valve regurgitation is trivial. Moderate aortic valve stenosis. FINDINGS  Left  Ventricle: Left ventricular ejection fraction, by estimation, is 60 to 65%. The left ventricle has normal function. The left ventricle has no regional wall motion abnormalities. Definity contrast agent was given IV to delineate the left ventricular  endocardial borders. The left ventricular internal cavity size was normal in size. There is mild left ventricular hypertrophy. Left ventricular diastolic parameters are indeterminate. Right Ventricle: The right ventricular size is normal. Right ventricular systolic function is normal. Left Atrium: Left atrial size was severely dilated. Right Atrium: Right atrial size was normal in size. Pericardium: There is no evidence of pericardial effusion. Mitral Valve: The mitral valve is normal in structure. Severe mitral annular calcification. Mild mitral valve regurgitation. No evidence of mitral valve stenosis. Tricuspid Valve: The tricuspid valve is normal in structure. Tricuspid valve regurgitation is trivial. No evidence of tricuspid stenosis. Aortic Valve: The aortic valve is normal in structure. Aortic valve regurgitation is trivial. Moderate aortic stenosis is present. Aortic valve mean gradient measures 29.2 mmHg. Aortic valve peak gradient measures 53.9 mmHg. Aortic valve area, by VTI measures 1.14 cm. Pulmonic Valve: The pulmonic valve was not well visualized. Pulmonic valve regurgitation is not visualized. No evidence of pulmonic stenosis. Aorta: The aortic root was not well visualized. Venous: The inferior vena cava was not well visualized.  Additional Comments: No subcostal views.  LEFT VENTRICLE PLAX 2D LVIDd:         4.70 cm LVIDs:         3.60 cm LV PW:         1.40 cm LV IVS:        1.30 cm LVOT diam:     2.00 cm LV SV:         90 LV SV Index:   40 LVOT Area:  3.14 cm  RIGHT VENTRICLE RV Basal diam:  3.30 cm RV S prime:     12.80 cm/s TAPSE (M-mode): 1.7 cm LEFT ATRIUM              Index       RIGHT ATRIUM           Index LA diam:        5.10 cm  2.25 cm/m   RA Area:     31.30 cm LA Vol (A2C):   152.0 ml 67.02 ml/m RA Volume:   113.00 ml 49.82 ml/m LA Vol (A4C):   226.0 ml 99.64 ml/m LA Biplane Vol: 186.0 ml 82.01 ml/m  AORTIC VALVE AV Area (Vmax):    0.98 cm AV Area (Vmean):   1.04 cm AV Area (VTI):     1.14 cm AV Vmax:           367.20 cm/s AV Vmean:          246.200 cm/s AV VTI:            0.786 m AV Peak Grad:      53.9 mmHg AV Mean Grad:      29.2 mmHg LVOT Vmax:         114.00 cm/s LVOT Vmean:        81.560 cm/s LVOT VTI:          0.285 m LVOT/AV VTI ratio: 0.36  AORTA Ao Root diam: 3.50 cm TRICUSPID VALVE TR Peak grad:   21.3 mmHg TR Vmax:        231.00 cm/s  SHUNTS Systemic VTI:  0.29 m Systemic Diam: 2.00 cm Kirk Ruths MD Electronically signed by Kirk Ruths MD Signature Date/Time: 10/09/2020/1:54:43 PM    Final (Updated)    DG Hip Port Unilat W or [place] Pelvis 1 View Left  Result Date: 10/08/2020 CLINICAL DATA:  Status post reduction EXAM: DG HIP (WITH OR WITHOUT PELVIS) 1V PORT LEFT COMPARISON:  Film from earlier in the same day. FINDINGS: Left hip prosthesis has now been reduced into the acetabular component. No acute fracture is seen. No soft tissue abnormality is noted. IMPRESSION: Status post reduction. Electronically Signed   By: Inez Catalina M.D.   On: 10/08/2020 19:55        Scheduled Meds: . sodium chloride   Intravenous Once  . allopurinol  50 mg Oral Q M,W,F  . ferrous sulfate  325 mg Oral Q breakfast  . [START ON 10/11/2020] furosemide  40 mg Intravenous BID  . insulin aspart  0-6 Units Subcutaneous TID WC  . metoprolol succinate  50 mg Oral Daily  . sodium bicarbonate  650 mg Oral TID  . vitamin B-12  1,000 mcg Oral Daily   Continuous Infusions: . heparin 1,850 Units/hr (10/10/20 1530)     LOS: 0 days    Time spent: 28mins    Kathie Dike, MD [facility]   If 7PM-7AM, please contact night-coverage [email or web address]  10/10/2020, 7:49 PM

## 2020-10-10 NOTE — Progress Notes (Signed)
Appointment rescheduled with Dr. Margaretann Loveless to 11/12/19 @ 1:40pm and plan for outpatient cardioversion on 11/19/20.

## 2020-10-10 NOTE — Progress Notes (Signed)
[place] for Heparin>>apixaban Indication: atrial fibrillation  Allergies  Allergen Reactions  . Penicillins Swelling, Rash and Other (See Comments)    Arms and eyes swell & skin breaks out  Has patient had a PCN reaction causing immediate rash, facial/tongue/throat swelling, SOB or lightheadedness with hypotension: Yes Has patient had a PCN reaction causing severe rash involving mucus membranes or skin necrosis: Yes Has patient had a PCN reaction that required hospitalization: No Has patient had a PCN reaction occurring within the last 10 years: No If all of the above answers are "NO", then may proceed with Cephalosporin use.     Patient Measurements: Height: 5\' 9"  (175.3 cm) Weight: 120.4 kg (265 lb 6.9 oz) IBW/kg (Calculated) : 70.7 Heparin Dosing Weight: 100 kg  Vital Signs: Temp: 98 F (36.7 C) (12/03 1855) Temp Source: Oral (12/03 1840) BP: 94/65 (12/03 1855) Pulse Rate: 86 (12/03 1855)  Labs: Recent Labs    10/08/20 1446 10/08/20 1446 10/08/20 2121 10/08/20 2337 10/09/20 0343 10/09/20 1940 10/10/20 0150 10/10/20 1345  HGB 8.0*   < >  --   --  7.8*  --  7.2*  --   HCT 26.8*  --   --   --  25.2*  --  24.1*  --   PLT 181  --   --   --  181  --  123*  --   APTT  --   --   --   --   --  70* 45* 65*  HEPARINUNFRC  --   --   --   --   --   --  >2.20*  --   CREATININE 4.74*  --   --   --  4.62*  --  4.92*  --   CKTOTAL 124  --   --   --   --   --   --   --   TROPONINIHS  --   --  84* 79*  --   --   --   --    < > = values in this interval not displayed.    Estimated Creatinine Clearance: 20.2 mL/min (A) (by C-G formula based on SCr of 4.92 mg/dL (H)).   Medical History: Past Medical History:  Diagnosis Date  . Anemia    labs today indicate (09/27/11)  . Arthritis    right knee  . Bleeding ulcer    hx of, required 6 units blood  . Blood transfusion    6 units with ulcer repair surgery  . Chronic kidney disease     stage IV, has fistula, never started diaylsis  . Complication of anesthesia    little slow to wake up with last surgery  . Dyspnea    with exertion  . Gout   . Hip dislocation, left ([place]) 10/09/2020  . Hypertension   . Osteoporosis   . Pneumonia     Medications:  No current facility-administered medications on file prior to encounter.   Current Outpatient Medications on File Prior to Encounter  Medication Sig Dispense Refill  . allopurinol (ZYLOPRIM) 300 MG tablet Take 300 mg by mouth 2 (two) times daily.     . cholecalciferol (VITAMIN D) 25 MCG (1000 UNIT) tablet Take 1 tablet (1,000 Units total) by mouth daily. 30 tablet 0  . ferrous sulfate 325 (65 FE) MG tablet Take 1 tablet (325 mg total) by mouth daily with breakfast. 30 tablet 0  . furosemide (LASIX) 40 MG tablet  Take 1 tablet (40 mg total) by mouth 2 (two) times daily. 60 tablet 0  . Multiple Vitamin (MULTIVITAMIN WITH MINERALS) TABS tablet Take 1 tablet by mouth daily after breakfast.    . sodium bicarbonate 650 MG tablet Take 1 tablet (650 mg total) by mouth 3 (three) times daily. 90 tablet 0  . vitamin B-12 (CYANOCOBALAMIN) 1000 MCG tablet Take 1,000 mcg by mouth daily.    Marland Kitchen allopurinol (ZYLOPRIM) 100 MG tablet Take 0.5 tablets (50 mg total) by mouth every Monday, Wednesday, and Friday. (Patient not taking: Reported on 10/08/2020) 6 tablet 0  . apixaban (ELIQUIS) 5 MG TABS tablet Take 1 tablet (5 mg total) by mouth 2 (two) times daily. (Patient not taking: Reported on 10/08/2020) 30 tablet 0  . metoprolol tartrate (LOPRESSOR) 100 MG tablet Take 1 tablet (100 mg total) by mouth 2 (two) times daily. (Patient not taking: Reported on 10/08/2020) 60 tablet 0     Assessment: 59 y.o. male with Aflutter, Eliquis on hold, for heparin.  Eliquis given in ED at midnight 12/1.   APTT this afternoon came back slightly subtherapeutic at 65, on 1750 units/hr. Hgb 7.2 (trending down), plt 123. No s/sx of bleeding or infusion issues.    Transition back to apixaban tonight. Plan for outpt cardioversion. Age<80, wt>60 kg  Goal of Therapy:  Monitor platelets by anticoagulation protocol: Yes   Plan:  Dc heparin Apixaban 5mg  PO BID  Onnie Boer, PharmD, [place], AAHIVP, CPP Infectious Disease Pharmacist 10/10/2020 8:12 PM

## 2020-10-11 DIAGNOSIS — S73005A Unspecified dislocation of left hip, initial encounter: Secondary | ICD-10-CM | POA: Diagnosis not present

## 2020-10-11 DIAGNOSIS — N185 Chronic kidney disease, stage 5: Secondary | ICD-10-CM | POA: Diagnosis not present

## 2020-10-11 DIAGNOSIS — I4892 Unspecified atrial flutter: Secondary | ICD-10-CM | POA: Diagnosis not present

## 2020-10-11 LAB — TYPE AND SCREEN
ABO/RH(D): O POS
Antibody Screen: NEGATIVE
Unit division: 0

## 2020-10-11 LAB — CBC
HCT: 29.1 % — ABNORMAL LOW (ref 39.0–52.0)
Hemoglobin: 8.9 g/dL — ABNORMAL LOW (ref 13.0–17.0)
MCH: 31.3 pg (ref 26.0–34.0)
MCHC: 30.6 g/dL (ref 30.0–36.0)
MCV: 102.5 fL — ABNORMAL HIGH (ref 80.0–100.0)
Platelets: 168 10*3/uL (ref 150–400)
RBC: 2.84 MIL/uL — ABNORMAL LOW (ref 4.22–5.81)
RDW: 16.2 % — ABNORMAL HIGH (ref 11.5–15.5)
WBC: 12 10*3/uL — ABNORMAL HIGH (ref 4.0–10.5)
nRBC: 0.4 % — ABNORMAL HIGH (ref 0.0–0.2)

## 2020-10-11 LAB — BASIC METABOLIC PANEL
Anion gap: 15 (ref 5–15)
BUN: 113 mg/dL — ABNORMAL HIGH (ref 8–23)
CO2: 17 mmol/L — ABNORMAL LOW (ref 22–32)
Calcium: 10.7 mg/dL — ABNORMAL HIGH (ref 8.9–10.3)
Chloride: 106 mmol/L (ref 98–111)
Creatinine, Ser: 4.94 mg/dL — ABNORMAL HIGH (ref 0.61–1.24)
GFR, Estimated: 13 mL/min — ABNORMAL LOW (ref >60)
Glucose, Bld: 99 mg/dL (ref 70–99)
Potassium: 4.4 mmol/L (ref 3.5–5.1)
Sodium: 138 mmol/L (ref 135–145)

## 2020-10-11 LAB — GLUCOSE, CAPILLARY
Glucose-Capillary: 91 mg/dL (ref 70–99)
Glucose-Capillary: 92 mg/dL (ref 70–99)
Glucose-Capillary: 103 mg/dL — ABNORMAL HIGH (ref 70–99)
Glucose-Capillary: 165 mg/dL — ABNORMAL HIGH (ref 70–99)

## 2020-10-11 LAB — HEMOGLOBIN A1C
Hgb A1c MFr Bld: 5.5 % (ref 4.8–5.6)
Mean Plasma Glucose: 111.15 mg/dL

## 2020-10-11 LAB — BPAM RBC
Blood Product Expiration Date: 202201042359
ISSUE DATE / TIME: 202112031833
Unit Type and Rh: 5100

## 2020-10-11 MED ORDER — FUROSEMIDE 40 MG PO TABS
40.0000 mg | ORAL_TABLET | Freq: Two times a day (BID) | ORAL | Status: DC
  Administered 2020-10-11 – 2020-10-16 (×11): 40 mg via ORAL
  Filled 2020-10-11 (×11): qty 1

## 2020-10-11 MED ORDER — POLYETHYLENE GLYCOL 3350 17 G PO PACK
17.0000 g | PACK | Freq: Every day | ORAL | Status: DC
  Administered 2020-10-11 – 2020-10-12 (×2): 17 g via ORAL
  Filled 2020-10-11 (×2): qty 1

## 2020-10-11 MED ORDER — RISAQUAD PO CAPS
1.0000 | ORAL_CAPSULE | Freq: Every day | ORAL | Status: DC
  Administered 2020-10-11 – 2020-10-16 (×6): 1 via ORAL
  Filled 2020-10-11 (×6): qty 1

## 2020-10-11 NOTE — Progress Notes (Addendum)
Physical Therapy Treatment Patient Details Name: Mark [name] MRN: [id number] DOB: [birth year] Today's Date: 10/11/2020    History of Present Illness Pt is a 59 y.o. male admitted for L hip dislocation. PMH consists of recurrent L hip dislocation, h/o L THA 2014, a flutter, CKD, HTN, osteoporosis, tobacco use, and arthritis.    PT Comments    Pt was evaluated by PT yesterday morning but stated he didn't remember getting up to amb with PT. On arrival today pt sleeping soundly and had to have sternal rub to awaken. Pt groggy and initially disoriented to place. He said he felt confused. Nurse in to assess. Pt able to recall all events of the AM and said he just felt groggy. Strength normal and pt more alert. Reiterated need to wear hip abduction brace (pt was in the bed without brace when I arrived). Pt able to verbalize hip precautions. Pt moves fairly well but I suspect compliance with the hip abduction brace at home will not be good and will be high risk to dislocate. May need SNF.    Follow Up Recommendations  SNF     Equipment Recommendations  None recommended by PT    Recommendations for Other Services       Precautions / Restrictions Precautions Precautions: Fall;Posterior Hip Precaution Comments: strict posterior hip precautions Required Braces or Orthoses: Other Brace Other Brace: hinged abduction brace Restrictions Weight Bearing Restrictions: No LLE Weight Bearing: Weight bearing as tolerated    Mobility  Bed Mobility Overal bed mobility: Needs Assistance Bed Mobility: Supine to Sit;Sit to Supine;Rolling Rolling: Min assist (and pillow between knees)   Supine to sit: Supervision Sit to supine: Min assist;HOB elevated   General bed mobility comments: Supervision for safety to come to sitting. Assist to bring LLE back into the bed returning to supine.  Transfers Overall transfer level: Needs assistance Equipment used: Rolling walker (2 wheeled) Transfers: Sit  to/from Stand Sit to Stand: Min guard         General transfer comment: increased time  Ambulation/Gait Ambulation/Gait assistance: Min guard Gait Distance (Feet): 150 Feet Assistive device: Rolling walker (2 wheeled) Gait Pattern/deviations: Step-to pattern;Decreased stride length Gait velocity: decreased Gait velocity interpretation: 1.31 - 2.62 ft/sec, indicative of limited community ambulator General Gait Details: Assist for safety   Stairs             Wheelchair Mobility    Modified Rankin (Stroke Patients Only)       Balance Overall balance assessment: Needs assistance Sitting-balance support: No upper extremity supported;Feet supported Sitting balance-Leahy Scale: Good     Standing balance support: Bilateral upper extremity supported;During functional activity Standing balance-Leahy Scale: Fair Standing balance comment: static stand without UE support. RW for amb.                            Cognition Arousal/Alertness: Awake/alert;Lethargic Behavior During Therapy: WFL for tasks assessed/performed Overall Cognitive Status: Impaired/Different from baseline Area of Impairment: Orientation;Problem solving;Memory                 Orientation Level: Disoriented to;Place;Time   Memory: Decreased short-term memory       Problem Solving: Slow processing General Comments: On arrival pt sleeping soundly requiring sternal rub to arouse. Pt remained groggy and said he felt confused. Was talking about something and when I asked what he was talking about he said he thinks he was dreaming. Became more alert and could recall all  events from the morning. Nurse aware and assessed.       Exercises      General Comments        Pertinent Vitals/Pain Pain Assessment: No/denies pain    Home Living                      Prior Function            PT Goals (current goals can now be found in the care plan section) Acute Rehab PT  Goals Patient Stated Goal: home Progress towards PT goals: Progressing toward goals    Frequency    Min 5X/week      PT Plan Current plan remains appropriate    Co-evaluation              AM-PAC PT "6 Clicks" Mobility   Outcome Measure  Help needed turning from your back to your side while in a flat bed without using bedrails?: A Little Help needed moving from lying on your back to sitting on the side of a flat bed without using bedrails?: A Little Help needed moving to and from a bed to a chair (including a wheelchair)?: A Little Help needed standing up from a chair using your arms (e.g., wheelchair or bedside chair)?: A Little Help needed to walk in hospital room?: A Little Help needed climbing 3-5 steps with a railing? : A Lot 6 Click Score: 17    End of Session Equipment Utilized During Treatment: Gait belt;Other (comment) (hip abduction brace) Activity Tolerance: Patient tolerated treatment well Patient left: in bed;with call bell/phone within reach;with bed alarm set Nurse Communication: Mobility status;Other (comment) (confusion) PT Visit Diagnosis: Difficulty in walking, not elsewhere classified (R26.2)     Time: 4481-8563 PT Time Calculation (min) (ACUTE ONLY): 31 min  Charges:  $Gait Training: 23-37 mins                     [place] Pager [phone] Office [place] 10/11/2020, 12:34 PM

## 2020-10-11 NOTE — Progress Notes (Signed)
ORTHO:  Seen today for Dr Mayer Camel.  Has had brace applied but could not sleep in it.  Seen now in chair beside bed without brace.  Has not yet had PT.  Suppose it is OK to sleep without brace on but must be in knee immobilizer in bed if he cannot tolerate abduction brace.  Must apply brace again before getting OOB especially when sitting in chair as he is most prone to dislocate again when getting up from the chair.  Still needs PT for ambulation/education.

## 2020-10-11 NOTE — Progress Notes (Signed)
PROGRESS NOTE    Hewitt Garner Richens  YNW:[id number] DOB: [birth year] DOA: 10/08/2020 PCP: Delrae Rend, MD    Brief Narrative:  59 year old male with history of atrial flutter, chronic kidney disease stage V not on dialysis, chronic anemia, previous left hip surgery, presents to the emergency room with left hip pain.  Imaging revealed left hip dislocation.  He was also noted to be tachycardic in rapid atrial flutter.  He was seen by orthopedic and hip was reduced.  Started on Cardizem infusion.  Cardiology following.   Assessment & Plan:   Principal Problem:   Atrial flutter with rapid ventricular response (HCC) Active Problems:   DM (diabetes mellitus) ([place])   Closed dislocation of left hip (HCC)   CKD (chronic kidney disease), stage IV (HCC)   Atrial flutter (HCC)   Atrial flutter with RVR.   -Patient was unable to take metoprolol at home -Started on Cardizem infusion in the hospital -Also started on Toprol -Overall heart rate appears to be improving -He has been weaned off cardizem infusion -Heart rate stable on Toprol -Anticoagulated with Eliquis -Echo with preserved EF -plans are for outpatient cardiology follow up and cardioversion next month  Recurrent left hip dislocation -Seen by orthopedics and underwent reduction -Patient will be followed up by orthopedics in 1 week in the outpatient setting -Recommendations are for patient to wear hinged abduction brace set at 60 degrees flexion and 15 degrees of abduction -Weightbearing as tolerated with strict posterior hip precautions -Seen by physical therapy and it was agreed that patient would be high risk for recurrent dislocation.  He does not have adequate help at home.  Since he frequently removes his brace for discomfort, he would be high risk for fall/dislocation -Agree that he would benefit from short-term rehab until he can be seen by orthopedics in a week to discuss definitive operative management  Chronic  anemia -Likely related to kidney disease -Hemoglobin down to 7.2 -Patient was transfused 1 unit of PRBC after discussion with nephrology -Follow-up hemoglobin increased to 8.9 -Continue to monitor  Chronic kidney disease stage IV-V -Patient had AV fistula placed several years ago, although he has not undergone dialysis -Creatinine appears to be near baseline -Continue to monitor -Follow-up with primary nephrologist, Dr. Posey Pronto  Hyperkalemia -Improved with Lokelma and Lasix  Metabolic acidosis -Likely related to renal failure -He is on chronic oral bicarbonate replacement  Acute on chronic diastolic congestive heart failure -Overall volume status appears to be better -Lungs are clear and lower extremity edema has resolved -He is on room air -We will transition back to home dose of oral Lasix 40 mg twice daily  Diabetes type 2 -He is not on any chronic medications -Blood sugars have been stable -Continue on sliding scale -A1c pending  DVT prophylaxis: Eliquis  Code Status: full code Family Communication: discussed with patient Disposition Plan: Status is: Inpatient  The patient will require care spanning > 2 midnights and should be moved to inpatient because: Unsafe d/c plan  Dispo: The patient is from: Home              Anticipated d/c is to: SNF              Anticipated d/c date is: 1 day              Patient currently is medically stable to d/c.    Consultants:   Ortho, Dr. Mayer Camel  Cardiology  Procedures:   Reduction of dislocation of left hip  Antimicrobials:  Subjective: Feels her shortness of breath is improving.  Overall fatigue and weakness better after transfusion of PRBC yesterday.  Reports that brace is uncomfortable.  Found not wearing brace this morning.  He says he is willing to wear it.  Objective: Vitals:   10/11/20 0050 10/11/20 0454 10/11/20 0822 10/11/20 1122  BP: 126/83 119/80 103/67 103/75  Pulse: 79 82 87 77  Resp: 18 14 16  17   Temp: 97.9 F (36.6 C) 97.7 F (36.5 C) 97.8 F (36.6 C) 98 F (36.7 C)  TempSrc: Oral Oral Oral Oral  SpO2: 96% 99% 96% 97%  Weight:  115.1 kg    Height:        Intake/Output Summary (Last 24 hours) at 10/11/2020 1300 Last data filed at 10/11/2020 1142 Gross per 24 hour  Intake 1155 ml  Output 1820 ml  Net -665 ml   Filed Weights   10/08/20 1349 10/10/20 0416 10/11/20 0454  Weight: 113 kg 120.4 kg 115.1 kg    Examination:  General exam: Alert, awake, oriented x 3 Respiratory system: Clear to auscultation. Respiratory effort normal. Cardiovascular system:RRR. No murmurs, rubs, gallops. Gastrointestinal system: Abdomen is nondistended, soft and nontender. No organomegaly or masses felt. Normal bowel sounds heard. Central nervous system: Alert and oriented. No focal neurological deficits. Extremities: no edema bilaterally Skin: No rashes, lesions or ulcers Psychiatry: Judgement and insight appear normal. Mood & affect appropriate.    Data Reviewed: I have personally reviewed following labs and imaging studies  CBC: Recent Labs  Lab 10/08/20 1446 10/09/20 0343 10/10/20 0150 10/11/20 0154  WBC 8.7 9.0 10.7* 12.0*  NEUTROABS 7.0  --   --   --   HGB 8.0* 7.8* 7.2* 8.9*  HCT 26.8* 25.2* 24.1* 29.1*  MCV 106.8* 104.6* 106.6* 102.5*  PLT 181 181 123* 622   Basic Metabolic Panel: Recent Labs  Lab 10/08/20 1446 10/09/20 0343 10/10/20 0150 10/11/20 0154  NA 144 143 139 138  K 4.9 5.4* 5.2* 4.4  CL 113* 111 109 106  CO2 14* 16* 15* 17*  GLUCOSE 68* 113* 111* 99  BUN 110* 106* 112* 113*  CREATININE 4.74* 4.62* 4.92* 4.94*  CALCIUM 11.0* 11.4* 11.2* 10.7*  PHOS  --   --  8.2*  --    GFR: Estimated Creatinine Clearance: 19.7 mL/min (A) (by C-G formula based on SCr of 4.94 mg/dL (H)). Liver Function Tests: Recent Labs  Lab 10/10/20 0150  ALBUMIN 2.8*   No results for input(s): LIPASE, AMYLASE in the last 168 hours. No results for input(s): AMMONIA in the  last 168 hours. Coagulation Profile: No results for input(s): INR, PROTIME in the last 168 hours. Cardiac Enzymes: Recent Labs  Lab 10/08/20 1446  CKTOTAL 124   BNP (last 3 results) No results for input(s): PROBNP in the last 8760 hours. HbA1C: No results for input(s): HGBA1C in the last 72 hours. CBG: Recent Labs  Lab 10/10/20 1131 10/10/20 1639 10/10/20 2134 10/11/20 0603 10/11/20 1139  GLUCAP 119* 110* 99 91 92   Lipid Profile: No results for input(s): CHOL, HDL, LDLCALC, TRIG, CHOLHDL, LDLDIRECT in the last 72 hours. Thyroid Function Tests: Recent Labs    10/08/20 2233  TSH 1.804   Anemia Panel: No results for input(s): VITAMINB12, FOLATE, FERRITIN, TIBC, IRON, RETICCTPCT in the last 72 hours. Sepsis Labs: No results for input(s): PROCALCITON, LATICACIDVEN in the last 168 hours.  Recent Results (from the past 240 hour(s))  Resp Panel by RT-PCR (Flu A&B, Covid) Nasopharyngeal Swab  Status: None   Collection Time: 10/08/20  6:19 PM   Specimen: Nasopharyngeal Swab; Nasopharyngeal(NP) swabs in vial transport medium  Result Value Ref Range Status   SARS Coronavirus 2 by RT PCR NEGATIVE NEGATIVE Final    Comment: (NOTE) SARS-CoV-2 target nucleic acids are NOT DETECTED.  The SARS-CoV-2 RNA is generally detectable in upper respiratory specimens during the acute phase of infection. The lowest concentration of SARS-CoV-2 viral copies this assay can detect is 138 copies/mL. A negative result does not preclude SARS-Cov-2 infection and should not be used as the sole basis for treatment or other patient management decisions. A negative result may occur with  improper specimen collection/handling, submission of specimen other than nasopharyngeal swab, presence of viral mutation(s) within the areas targeted by this assay, and inadequate number of viral copies(<138 copies/mL). A negative result must be combined with clinical observations, patient history, and  epidemiological information. The expected result is Negative.  Fact Sheet for Patients:  EntrepreneurPulse.com.au  Fact Sheet for Healthcare Providers:  IncredibleEmployment.be  This test is no t yet approved or cleared by the [place] FDA and  has been authorized for detection and/or diagnosis of SARS-CoV-2 by FDA under an Emergency Use Authorization (EUA). This EUA will remain  in effect (meaning this test can be used) for the duration of the COVID-19 declaration under Section 564(b)(1) of the Act, 21 U.S.C.section 360bbb-3(b)(1), unless the authorization is terminated  or revoked sooner.       Influenza A by PCR NEGATIVE NEGATIVE Final   Influenza B by PCR NEGATIVE NEGATIVE Final    Comment: (NOTE) The Xpert Xpress SARS-CoV-2/FLU/RSV plus assay is intended as an aid in the diagnosis of influenza from Nasopharyngeal swab specimens and should not be used as a sole basis for treatment. Nasal washings and aspirates are unacceptable for Xpert Xpress SARS-CoV-2/FLU/RSV testing.  Fact Sheet for Patients: EntrepreneurPulse.com.au  Fact Sheet for Healthcare Providers: IncredibleEmployment.be  This test is not yet approved or cleared by the [place] FDA and has been authorized for detection and/or diagnosis of SARS-CoV-2 by FDA under an Emergency Use Authorization (EUA). This EUA will remain in effect (meaning this test can be used) for the duration of the COVID-19 declaration under Section 564(b)(1) of the Act, 21 U.S.C. section 360bbb-3(b)(1), unless the authorization is terminated or revoked.  Performed at [facility], [place] [address] [place], [place] [address]          Radiology Studies: No results found.      Scheduled Meds: . sodium chloride   Intravenous Once  . allopurinol  50 mg Oral Q M,W,F  . apixaban  5 mg Oral BID  . ferrous sulfate  325 mg Oral Q breakfast  .  furosemide  40 mg Oral BID  . insulin aspart  0-6 Units Subcutaneous TID WC  . metoprolol succinate  50 mg Oral Daily  . sodium bicarbonate  650 mg Oral TID  . vitamin B-12  1,000 mcg Oral Daily   Continuous Infusions:    LOS: 1 day    Time spent: 3mins    Kathie Dike, MD [facility]   If 7PM-7AM, please contact night-coverage [email or web address]  10/11/2020, 1:00 PM

## 2020-10-11 NOTE — Progress Notes (Signed)
Patient evaluated for new yellow mews. Rapid, charge nurse and this RN at bedside. Patient, once roused from sleep, appropriate with no pain complaint. Patient is tachy with all other vitals wnl. Will continue to monitor and follow yellow mews protocol.

## 2020-10-11 NOTE — Progress Notes (Signed)
Progress Note  Patient Name: Mark [name] Date of Encounter: 10/11/2020  [facility] Cardiologist: Dr. Margaretann Loveless  Subjective   No acute events overnight. Awaiting PT re-evaluation. No specific complaints at this time other than he is tired.  Inpatient Medications    Scheduled Meds: . sodium chloride   Intravenous Once  . allopurinol  50 mg Oral Q M,W,F  . apixaban  5 mg Oral BID  . ferrous sulfate  325 mg Oral Q breakfast  . furosemide  40 mg Oral BID  . insulin aspart  0-6 Units Subcutaneous TID WC  . metoprolol succinate  50 mg Oral Daily  . sodium bicarbonate  650 mg Oral TID  . vitamin B-12  1,000 mcg Oral Daily   Continuous Infusions:  PRN Meds: acetaminophen **OR** acetaminophen   Vital Signs    Vitals:   10/11/20 0050 10/11/20 0454 10/11/20 0822 10/11/20 1122  BP: 126/83 119/80 103/67 103/75  Pulse: 79 82 87 77  Resp: 18 14 16 17   Temp: 97.9 F (36.6 C) 97.7 F (36.5 C) 97.8 F (36.6 C) 98 F (36.7 C)  TempSrc: Oral Oral Oral Oral  SpO2: 96% 99% 96% 97%  Weight:  115.1 kg    Height:        Intake/Output Summary (Last 24 hours) at 10/11/2020 1355 Last data filed at 10/11/2020 1142 Gross per 24 hour  Intake 1155 ml  Output 1820 ml  Net -665 ml   Last 3 Weights 10/11/2020 10/10/2020 10/08/2020  Weight (lbs) 253 lb 11.2 oz 265 lb 6.9 oz 249 lb 1.9 oz  Weight (kg) 115.078 kg 120.4 kg 113 kg      Telemetry    Atrial flutter, largely rate controlled in the 80s with occasional bursts in the 110s- Personally Reviewed  ECG    No new tracings since 10/09/20  Physical Exam   GEN: Well nourished, well developed in no acute distress NECK: No JVD CARDIAC: regular rhythm, normal S1 and S2, no rubs or gallops. No murmur. VASCULAR: Radial pulses 2+ bilaterally.  RESPIRATORY:  Clear to auscultation without rales, wheezing or rhonchi  ABDOMEN: Soft, non-tender, non-distended MUSCULOSKELETAL:  Moves all 4 limbs independently SKIN: Warm and dry, no  edema NEUROLOGIC:  No focal neuro deficits noted. PSYCHIATRIC:  Normal affect, falls asleep easily  Labs    High Sensitivity Troponin:   Recent Labs  Lab 10/08/20 2121 10/08/20 2337  TROPONINIHS 84* 79*      Chemistry Recent Labs  Lab 10/09/20 0343 10/10/20 0150 10/11/20 0154  NA 143 139 138  K 5.4* 5.2* 4.4  CL 111 109 106  CO2 16* 15* 17*  GLUCOSE 113* 111* 99  BUN 106* 112* 113*  CREATININE 4.62* 4.92* 4.94*  CALCIUM 11.4* 11.2* 10.7*  ALBUMIN  --  2.8*  --   GFRNONAA 14* 13* 13*  ANIONGAP 16* 15 15     Hematology Recent Labs  Lab 10/09/20 0343 10/10/20 0150 10/11/20 0154  WBC 9.0 10.7* 12.0*  RBC 2.41* 2.26* 2.84*  HGB 7.8* 7.2* 8.9*  HCT 25.2* 24.1* 29.1*  MCV 104.6* 106.6* 102.5*  MCH 32.4 31.9 31.3  MCHC 31.0 29.9* 30.6  RDW 15.5 15.4 16.2*  PLT 181 123* 168    Radiology    No results found.  Cardiac Studies   Echo 10/09/2020 1. No subcostal views.  2. Left ventricular ejection fraction, by estimation, is 60 to 65%. The  left ventricle has normal function. The left ventricle has no regional  wall motion abnormalities. There is mild left ventricular hypertrophy.  Left ventricular diastolic parameters  are indeterminate.  3. Right ventricular systolic function is normal. The right ventricular  size is normal.  4. Left atrial size was severely dilated.  5. The mitral valve is normal in structure. Mild mitral valve  regurgitation. No evidence of mitral stenosis. Severe mitral annular  calcification.  6. The aortic valve is normal in structure. Aortic valve regurgitation is  trivial. Moderate aortic valve stenosis.   Patient Profile     59 y.o. male with a hx of atrial flutter during a hospitalization 07/2020 (cariology curb-sided), CKD Stage V follows with nephrology, anemia, HTN and gout who is here with left hip dislocation and cardiology is being seen  for the evaluation of recurrent atrial flutter with RVR at the request of Dr.  Hal Hope.   Assessment & Plan    Atrial flutter with RVR -diagnosed 07/2020, previously planned for outpatient cardioversion but only received anticoagulation for one month -Echo with preserved LVEF -tolerating metoprolol succinate 50mg  daily. Was on metoprolol tartrate 100 mg BID as an outpatient but does not appear he was taking prior to admission. Blood pressure has been borderline, will hold at this dose of metoprolol -see prior notes re: timing of cardioversion. This largely hinges on plan for surgery. Will need at least 3 weeks of uninterrupted anticoagulation prior to cardioversion and then 4 weeks after. If surgery needs to occur in this window, will need to reassess plan. Has follow up scheduled with Dr. Margaretann Loveless 11/12/19 and cardioversion 11/19/20. -CHA2DS2/VAS Stroke Risk Points= 3, on apixaban (as below)  Hip dislocation: -per primary team and orthopedics -per Dr. Damita Dunnings note 10/09/20, likely will need to be medically optimized before considering hip surgery  Chronic kidney disease, stage 5 Chronic anemia -On apixaban 5 mg BID, does not meet age/weight criteria for reduced dose. With chronic anemia, monitor closely. -Cr 4.94 today -H/H 8.9/29.1 today. Received transfusion this admission -he is currently on IV lasix BID. Does not appear grossly volume overloaded. He was on 40 mg oral BID at home. Given his renal disease, suspect he will need a higher dose of lasix. This should be followed closely by nephrology, can be done as an outpatient.  Reported history of type II diabetes: -no A1c available since 2012, will add to labs today  For questions or updates, please contact [place] Please consult [email or web address] for contact info under   Buford Dresser, MD 10/11/20 1:55 PM

## 2020-10-12 DIAGNOSIS — N189 Chronic kidney disease, unspecified: Secondary | ICD-10-CM

## 2020-10-12 DIAGNOSIS — D631 Anemia in chronic kidney disease: Secondary | ICD-10-CM

## 2020-10-12 DIAGNOSIS — R7989 Other specified abnormal findings of blood chemistry: Secondary | ICD-10-CM

## 2020-10-12 DIAGNOSIS — I4892 Unspecified atrial flutter: Secondary | ICD-10-CM | POA: Diagnosis not present

## 2020-10-12 LAB — BASIC METABOLIC PANEL
Anion gap: 13 (ref 5–15)
BUN: 115 mg/dL — ABNORMAL HIGH (ref 8–23)
CO2: 21 mmol/L — ABNORMAL LOW (ref 22–32)
Calcium: 11.1 mg/dL — ABNORMAL HIGH (ref 8.9–10.3)
Chloride: 104 mmol/L (ref 98–111)
Creatinine, Ser: 4.68 mg/dL — ABNORMAL HIGH (ref 0.61–1.24)
GFR, Estimated: 13 mL/min — ABNORMAL LOW (ref >60)
Glucose, Bld: 102 mg/dL — ABNORMAL HIGH (ref 70–99)
Potassium: 4.4 mmol/L (ref 3.5–5.1)
Sodium: 138 mmol/L (ref 135–145)

## 2020-10-12 LAB — CBC
HCT: 27.1 % — ABNORMAL LOW (ref 39.0–52.0)
Hemoglobin: 8.3 g/dL — ABNORMAL LOW (ref 13.0–17.0)
MCH: 31.3 pg (ref 26.0–34.0)
MCHC: 30.6 g/dL (ref 30.0–36.0)
MCV: 102.3 fL — ABNORMAL HIGH (ref 80.0–100.0)
Platelets: 188 10*3/uL (ref 150–400)
RBC: 2.65 MIL/uL — ABNORMAL LOW (ref 4.22–5.81)
RDW: 16 % — ABNORMAL HIGH (ref 11.5–15.5)
WBC: 11.7 10*3/uL — ABNORMAL HIGH (ref 4.0–10.5)
nRBC: 0.2 % (ref 0.0–0.2)

## 2020-10-12 LAB — GLUCOSE, CAPILLARY
Glucose-Capillary: 88 mg/dL (ref 70–99)
Glucose-Capillary: 118 mg/dL — ABNORMAL HIGH (ref 70–99)
Glucose-Capillary: 109 mg/dL — ABNORMAL HIGH (ref 70–99)
Glucose-Capillary: 101 mg/dL — ABNORMAL HIGH (ref 70–99)

## 2020-10-12 NOTE — Progress Notes (Signed)
Progress Note  Patient Name: Mark [name] Date of Encounter: 10/12/2020  [facility] Cardiologist: Dr. Margaretann Loveless  Subjective   Feels well after having a BM. No CP, palpitations or SOB.  Inpatient Medications    Scheduled Meds: . sodium chloride   Intravenous Once  . acidophilus  1 capsule Oral Daily  . allopurinol  50 mg Oral Q M,W,F  . apixaban  5 mg Oral BID  . ferrous sulfate  325 mg Oral Q breakfast  . furosemide  40 mg Oral BID  . insulin aspart  0-6 Units Subcutaneous TID WC  . metoprolol succinate  50 mg Oral Daily  . polyethylene glycol  17 g Oral Daily  . sodium bicarbonate  650 mg Oral TID  . vitamin B-12  1,000 mcg Oral Daily   Continuous Infusions:  PRN Meds: acetaminophen **OR** acetaminophen   Vital Signs    Vitals:   10/12/20 0211 10/12/20 0510 10/12/20 0818 10/12/20 1121  BP: 102/70 127/90 (!) 115/92 98/75  Pulse: 78 96 87 (!) 116  Resp: 16 18 20 20   Temp: 97.8 F (36.6 C) 97.7 F (36.5 C) 97.6 F (36.4 C) 97.8 F (36.6 C)  TempSrc: Oral Oral Oral Oral  SpO2: 98% 100% 100% 100%  Weight:      Height:        Intake/Output Summary (Last 24 hours) at 10/12/2020 1420 Last data filed at 10/12/2020 1122 Gross per 24 hour  Intake 240 ml  Output 795 ml  Net -555 ml   Last 3 Weights 10/11/2020 10/10/2020 10/08/2020  Weight (lbs) 253 lb 11.2 oz 265 lb 6.9 oz 249 lb 1.9 oz  Weight (kg) 115.078 kg 120.4 kg 113 kg      Telemetry    Atrial flutter, rate around 100- Personally Reviewed  ECG    No new tracings since 10/09/20  Physical Exam   GEN: No acute distress.   Neck: No JVD Cardiac: irregular and mild tachycardia  Respiratory: Clear to auscultation bilaterally. GI: Soft, nontender, non-distended  MS: No edema; L Leg in brace Neuro:  Nonfocal  Psych: Normal affect    Labs    High Sensitivity Troponin:   Recent Labs  Lab 10/08/20 2121 10/08/20 2337  TROPONINIHS 84* 79*      Chemistry Recent Labs  Lab 10/10/20 0150  10/11/20 0154 10/12/20 0202  NA 139 138 138  K 5.2* 4.4 4.4  CL 109 106 104  CO2 15* 17* 21*  GLUCOSE 111* 99 102*  BUN 112* 113* 115*  CREATININE 4.92* 4.94* 4.68*  CALCIUM 11.2* 10.7* 11.1*  ALBUMIN 2.8*  --   --   GFRNONAA 13* 13* 13*  ANIONGAP 15 15 13      Hematology Recent Labs  Lab 10/10/20 0150 10/11/20 0154 10/12/20 0202  WBC 10.7* 12.0* 11.7*  RBC 2.26* 2.84* 2.65*  HGB 7.2* 8.9* 8.3*  HCT 24.1* 29.1* 27.1*  MCV 106.6* 102.5* 102.3*  MCH 31.9 31.3 31.3  MCHC 29.9* 30.6 30.6  RDW 15.4 16.2* 16.0*  PLT 123* 168 188    Radiology    No results found.  Cardiac Studies   Echo 10/09/2020 1. No subcostal views.  2. Left ventricular ejection fraction, by estimation, is 60 to 65%. The  left ventricle has normal function. The left ventricle has no regional  wall motion abnormalities. There is mild left ventricular hypertrophy.  Left ventricular diastolic parameters  are indeterminate.  3. Right ventricular systolic function is normal. The right ventricular  size  is normal.  4. Left atrial size was severely dilated.  5. The mitral valve is normal in structure. Mild mitral valve  regurgitation. No evidence of mitral stenosis. Severe mitral annular  calcification.  6. The aortic valve is normal in structure. Aortic valve regurgitation is  trivial. Moderate aortic valve stenosis.   Patient Profile     59 y.o. male with a hx of atrial flutter during a hospitalization 07/2020 (cariology curb-sided), CKD Stage V follows with nephrology, anemia, HTN and gout who is here with left hip dislocation and cardiology is being seen  for the evaluation of recurrent atrial flutter with RVR at the request of Dr. Hal Hope.  Assessment & Plan    Atrial flutter with RVR -diagnosed 07/2020, previously planned for outpatient cardioversion but only received anticoagulation for one month -Echo with preserved LVEF -continue metoprolol succinate 50 mg daily - follow up with  me, Dr. Margaretann Loveless 11/12/19 and tentatively plan cardioversion 11/19/20 if compliance with AC -CHA2DS2VASC 3, on apixaban   Hip dislocation: -per primary team and orthopedics -per Dr. Damita Dunnings note 10/09/20, likely will need to be medically optimized before considering hip surgery  Chronic kidney disease, stage 5 Chronic anemia -On apixaban 5 mg BID, does not meet age/weight criteria for reduced dose. With chronic anemia, monitor closely. -H/H 8.3/27.1 today. Received transfusion this admission. No overt bleeding. - on lasix 40 mg po BID, this may be ineffective dose in setting of CKD 5, observe for UOP.  Reported history of type II diabetes: - A1C 5.5%  For questions or updates, please contact [place] Please consult [email or web address] for contact info under   [name], MD 10/12/20 2:20 PM

## 2020-10-12 NOTE — Progress Notes (Signed)
PROGRESS NOTE  Mark [name] JME:[id number] DOB: [birth year]   PCP: Delrae Rend, MD  Patient is from: Home.  Lives alone.  DOA: 10/08/2020 LOS: 2  Chief complaints: Left hip pain  Brief Narrative / Interim history: 59 year old male with history of atrial flutter on Eliquis, CKD-5, DM-2, diastolic CHF, anemia of renal disease and recurrent left hip dislocation presenting with left hip pain.  Imaging revealed left hip dislocation that was reduced by orthopedic surgery.  He was also in atrial flutter with RVR and started on IV Cardizem.  Cardiology consulted.  Eventually, heart rate improved.  He was transitioned to p.o. metoprolol.  Therapy recommended SNF.   Subjective: Seen and examined earlier this morning.  No major events overnight of this morning.  He says he slept well after he took off his brace which was uncomfortable and painful to wear.  No complaints.  He denies chest pain, dyspnea, palpitation, GI or UTI symptoms.  Objective: Vitals:   10/12/20 0211 10/12/20 0510 10/12/20 0818 10/12/20 1121  BP: 102/70 127/90 (!) 115/92 98/75  Pulse: 78 96 87 (!) 116  Resp: 16 18 20 20   Temp: 97.8 F (36.6 C) 97.7 F (36.5 C) 97.6 F (36.4 C) 97.8 F (36.6 C)  TempSrc: Oral Oral Oral Oral  SpO2: 98% 100% 100% 100%  Weight:      Height:        Intake/Output Summary (Last 24 hours) at 10/12/2020 1134 Last data filed at 10/12/2020 1122 Gross per 24 hour  Intake 480 ml  Output 1015 ml  Net -535 ml   Filed Weights   10/08/20 1349 10/10/20 0416 10/11/20 0454  Weight: 113 kg 120.4 kg 115.1 kg    Examination:  GENERAL: No apparent distress.  Nontoxic. HEENT: MMM.  Vision and hearing grossly intact.  NECK: Supple.  No apparent JVD.  RESP:  No IWOB.  Fair aeration bilaterally. CVS:  RRR.  HR 90-100.  Heart sounds normal.  ABD/GI/GU: BS+. Abd soft, NTND.  MSK/EXT: Hardly moves LLE on to bed. SKIN: no apparent skin lesion or wound NEURO: Awake, alert and oriented  appropriately.  No apparent focal neuro deficit. PSYCH: Calm. Normal affect.  Procedures:  Closed reduction of left hip  Microbiology summarized: COVID-19 and influenza PCR nonreactive.  Assessment & Plan: Atrial flutter with RVR: RVR improved.  HR 90-100.  TTE with LVEF of 60 to 65%, severe LAE, but no significant valvular disease. -On metoprolol 50 mg twice daily (on 100 mg twice daily at home). -On Eliquis for anticoagulation. -Plan for DCCV next month -Cardiology following.  Recurrent left hip dislocation: s/p closed hip reduction by orthopedic surgery on 12/2. -Ortho recommended hinged abduction brace set to 60 degrees flexion and 15 degrees abduction but patient having difficulty tolerating the brace due to pain -WBAT with a straight posterior hip precautions -Outpatient follow-up with Ortho in 1 week -Therapy recommended SNF given risk for fall and recurrent dislocation.  He lives alone.  CKD-5/azotemia: Renal function at baseline.  Has AV fistula placed several years ago but never been on HD.  Followed by Dr. Posey Pronto.  Not uremic.  No hyperkalemia. -Continue monitoring  Anemia of renal disease: Hgb down to 7.2.  Baseline Hgb 8-9.  Transfused 1 unit and responded appropriately. -Continue monitoring -Check anemia panel -Continue p.o. ferrous sulfate  Hyperkalemia: Resolved after Lokelma Lasix.  Metabolic acidosis: Bicarb 21.  Improved.  Likely due to renal failure. -Continue oral bicarb replacement  Acute on chronic diastolic congestive heart failure: Diuresed  with IV Lasix and transition to p.o.  TTE as above.  Fairly euvolemic on exam now. -Continue p.o. Lasix 40 mg twice daily -Monitor fluid status and renal functions  Controlled DM-2 with CKD-5: A1c 5.5%. Recent Labs  Lab 10/11/20 1139 10/11/20 1559 10/11/20 2112 10/12/20 0619 10/12/20 1118  GLUCAP 92 103* 165* 88 118*  -Continue current insulin regimen  Morbid obesity in the setting of diabetes Body  mass index is 37.46 kg/m.  -Encourage lifestyle change to lose weight       DVT prophylaxis:   apixaban (ELIQUIS) tablet 5 mg  Code Status: Full code Family Communication: Patient and/or RN. Available if any question.  Status is: Inpatient  Remains inpatient appropriate because:Hemodynamically unstable   Dispo: The patient is from: Home              Anticipated d/c is to: SNF              Anticipated d/c date is: 1 day              Patient currently is not medically stable to d/c.       Consultants:  Cardiology   Sch Meds:  Scheduled Meds: . sodium chloride   Intravenous Once  . acidophilus  1 capsule Oral Daily  . allopurinol  50 mg Oral Q M,W,F  . apixaban  5 mg Oral BID  . ferrous sulfate  325 mg Oral Q breakfast  . furosemide  40 mg Oral BID  . insulin aspart  0-6 Units Subcutaneous TID WC  . metoprolol succinate  50 mg Oral Daily  . polyethylene glycol  17 g Oral Daily  . sodium bicarbonate  650 mg Oral TID  . vitamin B-12  1,000 mcg Oral Daily   Continuous Infusions: PRN Meds:.acetaminophen **OR** acetaminophen  Antimicrobials: Anti-infectives (From admission, onward)   None       I have personally reviewed the following labs and images: CBC: Recent Labs  Lab 10/08/20 1446 10/09/20 0343 10/10/20 0150 10/11/20 0154 10/12/20 0202  WBC 8.7 9.0 10.7* 12.0* 11.7*  NEUTROABS 7.0  --   --   --   --   HGB 8.0* 7.8* 7.2* 8.9* 8.3*  HCT 26.8* 25.2* 24.1* 29.1* 27.1*  MCV 106.8* 104.6* 106.6* 102.5* 102.3*  PLT 181 181 123* 168 188   BMP &GFR Recent Labs  Lab 10/08/20 1446 10/09/20 0343 10/10/20 0150 10/11/20 0154 10/12/20 0202  NA 144 143 139 138 138  K 4.9 5.4* 5.2* 4.4 4.4  CL 113* 111 109 106 104  CO2 14* 16* 15* 17* 21*  GLUCOSE 68* 113* 111* 99 102*  BUN 110* 106* 112* 113* 115*  CREATININE 4.74* 4.62* 4.92* 4.94* 4.68*  CALCIUM 11.0* 11.4* 11.2* 10.7* 11.1*  PHOS  --   --  8.2*  --   --    Estimated Creatinine Clearance: 20.7  mL/min (A) (by C-G formula based on SCr of 4.68 mg/dL (H)). Liver & Pancreas: Recent Labs  Lab 10/10/20 0150  ALBUMIN 2.8*   No results for input(s): LIPASE, AMYLASE in the last 168 hours. No results for input(s): AMMONIA in the last 168 hours. Diabetic: Recent Labs    10/11/20 1430  HGBA1C 5.5   Recent Labs  Lab 10/11/20 1139 10/11/20 1559 10/11/20 2112 10/12/20 0619 10/12/20 1118  GLUCAP 92 103* 165* 88 118*   Cardiac Enzymes: Recent Labs  Lab 10/08/20 1446  CKTOTAL 124   No results for input(s): PROBNP in the last 8760  hours. Coagulation Profile: No results for input(s): INR, PROTIME in the last 168 hours. Thyroid Function Tests: No results for input(s): TSH, T4TOTAL, FREET4, T3FREE, THYROIDAB in the last 72 hours. Lipid Profile: No results for input(s): CHOL, HDL, LDLCALC, TRIG, CHOLHDL, LDLDIRECT in the last 72 hours. Anemia Panel: No results for input(s): VITAMINB12, FOLATE, FERRITIN, TIBC, IRON, RETICCTPCT in the last 72 hours. Urine analysis:    Component Value Date/Time   COLORURINE YELLOW 07/26/2020 [place] 07/26/2020 1244   LABSPEC 1.012 07/26/2020 1244   PHURINE 5.0 07/26/2020 1244   GLUCOSEU NEGATIVE 07/26/2020 1244   HGBUR MODERATE (A) 07/26/2020 1244   BILIRUBINUR NEGATIVE 07/26/2020 1244   KETONESUR NEGATIVE 07/26/2020 1244   PROTEINUR 100 (A) 07/26/2020 1244   UROBILINOGEN 0.2 09/27/2011 1317   NITRITE NEGATIVE 07/26/2020 [place] 07/26/2020 1244   Sepsis Labs: Invalid input(s): PROCALCITONIN, [place]  Microbiology: Recent Results (from the past 240 hour(s))  Resp Panel by RT-PCR (Flu A&B, Covid) Nasopharyngeal Swab     Status: None   Collection Time: 10/08/20  6:19 PM   Specimen: Nasopharyngeal Swab; Nasopharyngeal(NP) swabs in vial transport medium  Result Value Ref Range Status   SARS Coronavirus 2 by RT PCR NEGATIVE NEGATIVE Final    Comment: (NOTE) SARS-CoV-2 target nucleic acids are NOT  DETECTED.  The SARS-CoV-2 RNA is generally detectable in upper respiratory specimens during the acute phase of infection. The lowest concentration of SARS-CoV-2 viral copies this assay can detect is 138 copies/mL. A negative result does not preclude SARS-Cov-2 infection and should not be used as the sole basis for treatment or other patient management decisions. A negative result may occur with  improper specimen collection/handling, submission of specimen other than nasopharyngeal swab, presence of viral mutation(s) within the areas targeted by this assay, and inadequate number of viral copies(<138 copies/mL). A negative result must be combined with clinical observations, patient history, and epidemiological information. The expected result is Negative.  Fact Sheet for Patients:  EntrepreneurPulse.com.au  Fact Sheet for Healthcare Providers:  IncredibleEmployment.be  This test is no t yet approved or cleared by the [place] FDA and  has been authorized for detection and/or diagnosis of SARS-CoV-2 by FDA under an Emergency Use Authorization (EUA). This EUA will remain  in effect (meaning this test can be used) for the duration of the COVID-19 declaration under Section 564(b)(1) of the Act, 21 U.S.C.section 360bbb-3(b)(1), unless the authorization is terminated  or revoked sooner.       Influenza A by PCR NEGATIVE NEGATIVE Final   Influenza B by PCR NEGATIVE NEGATIVE Final    Comment: (NOTE) The Xpert Xpress SARS-CoV-2/FLU/RSV plus assay is intended as an aid in the diagnosis of influenza from Nasopharyngeal swab specimens and should not be used as a sole basis for treatment. Nasal washings and aspirates are unacceptable for Xpert Xpress SARS-CoV-2/FLU/RSV testing.  Fact Sheet for Patients: EntrepreneurPulse.com.au  Fact Sheet for Healthcare Providers: IncredibleEmployment.be  This test is not yet  approved or cleared by the [place] FDA and has been authorized for detection and/or diagnosis of SARS-CoV-2 by FDA under an Emergency Use Authorization (EUA). This EUA will remain in effect (meaning this test can be used) for the duration of the COVID-19 declaration under Section 564(b)(1) of the Act, 21 U.S.C. section 360bbb-3(b)(1), unless the authorization is terminated or revoked.  Performed at [facility], [place] [address], [place], [place] [address]     Radiology Studies: No results found.  Saydie Gerdts T. Cuming  If 7PM-7AM, please contact night-coverage [email or web address] 10/12/2020, 11:34 AM

## 2020-10-13 DIAGNOSIS — N184 Chronic kidney disease, stage 4 (severe): Secondary | ICD-10-CM

## 2020-10-13 DIAGNOSIS — I4892 Unspecified atrial flutter: Secondary | ICD-10-CM | POA: Diagnosis not present

## 2020-10-13 LAB — GLUCOSE, CAPILLARY
Glucose-Capillary: 81 mg/dL (ref 70–99)
Glucose-Capillary: 88 mg/dL (ref 70–99)
Glucose-Capillary: 88 mg/dL (ref 70–99)

## 2020-10-13 LAB — MAGNESIUM: Magnesium: 1.6 mg/dL — ABNORMAL LOW (ref 1.7–2.4)

## 2020-10-13 LAB — CBC
HCT: 26 % — ABNORMAL LOW (ref 39.0–52.0)
Hemoglobin: 8.5 g/dL — ABNORMAL LOW (ref 13.0–17.0)
MCH: 32.3 pg (ref 26.0–34.0)
MCHC: 32.7 g/dL (ref 30.0–36.0)
MCV: 98.9 fL (ref 80.0–100.0)
Platelets: 180 10*3/uL (ref 150–400)
RBC: 2.63 MIL/uL — ABNORMAL LOW (ref 4.22–5.81)
RDW: 15.7 % — ABNORMAL HIGH (ref 11.5–15.5)
WBC: 9.4 10*3/uL (ref 4.0–10.5)
nRBC: 0 % (ref 0.0–0.2)

## 2020-10-13 LAB — IRON AND TIBC
Iron: 28 ug/dL — ABNORMAL LOW (ref 45–182)
Saturation Ratios: 13 % — ABNORMAL LOW (ref 17.9–39.5)
TIBC: 216 ug/dL — ABNORMAL LOW (ref 250–450)
UIBC: 188 ug/dL

## 2020-10-13 LAB — VITAMIN B12: Vitamin B-12: 4049 pg/mL — ABNORMAL HIGH (ref 180–914)

## 2020-10-13 LAB — RENAL FUNCTION PANEL
Albumin: 2.9 g/dL — ABNORMAL LOW (ref 3.5–5.0)
Anion gap: 13 (ref 5–15)
BUN: 111 mg/dL — ABNORMAL HIGH (ref 8–23)
CO2: 20 mmol/L — ABNORMAL LOW (ref 22–32)
Calcium: 11.1 mg/dL — ABNORMAL HIGH (ref 8.9–10.3)
Chloride: 105 mmol/L (ref 98–111)
Creatinine, Ser: 4.47 mg/dL — ABNORMAL HIGH (ref 0.61–1.24)
GFR, Estimated: 14 mL/min — ABNORMAL LOW (ref >60)
Glucose, Bld: 92 mg/dL (ref 70–99)
Phosphorus: 5 mg/dL — ABNORMAL HIGH (ref 2.5–4.6)
Potassium: 4.1 mmol/L (ref 3.5–5.1)
Sodium: 138 mmol/L (ref 135–145)

## 2020-10-13 LAB — RETICULOCYTES
Immature Retic Fract: 27.2 % — ABNORMAL HIGH (ref 2.3–15.9)
RBC.: 2.64 MIL/uL — ABNORMAL LOW (ref 4.22–5.81)
Retic Count, Absolute: 70.8 10*3/uL (ref 19.0–186.0)
Retic Ct Pct: 2.7 % (ref 0.4–3.1)

## 2020-10-13 LAB — FOLATE: Folate: 10.8 ng/mL (ref >5.9)

## 2020-10-13 LAB — FERRITIN: Ferritin: 599 ng/mL — ABNORMAL HIGH (ref 24–336)

## 2020-10-13 MED ORDER — METOPROLOL SUCCINATE ER 50 MG PO TB24
75.0000 mg | ORAL_TABLET | Freq: Every day | ORAL | Status: DC
  Administered 2020-10-14: 75 mg via ORAL
  Filled 2020-10-13: qty 1

## 2020-10-13 MED ORDER — POLYETHYLENE GLYCOL 3350 17 G PO PACK: 17.0000 g | PACK | Freq: Two times a day (BID) | ORAL | Status: DC | PRN

## 2020-10-13 MED ORDER — MAGNESIUM SULFATE 2 GM/50ML IV SOLN
2.0000 g | Freq: Once | INTRAVENOUS | Status: AC
  Administered 2020-10-13: 2 g via INTRAVENOUS
  Filled 2020-10-13: qty 50

## 2020-10-13 MED ORDER — METOPROLOL SUCCINATE ER 25 MG PO TB24
25.0000 mg | ORAL_TABLET | Freq: Once | ORAL | Status: AC
  Administered 2020-10-13: 25 mg via ORAL
  Filled 2020-10-13: qty 1

## 2020-10-13 MED ORDER — SENNOSIDES-DOCUSATE SODIUM 8.6-50 MG PO TABS: 1.0000 | ORAL_TABLET | Freq: Two times a day (BID) | ORAL | Status: DC | PRN

## 2020-10-13 NOTE — Progress Notes (Signed)
PATIENT ID: Mark [name]  MRN: [id number]  DOB/AGE:  Jan 26, 1961 / 59 y.o.        PROGRESS NOTE Subjective:   Patient is alert, oriented, no Nausea, no Vomiting, yes passing gas, yes Bowel Movement. Taking PO well. Denies SOB, Chest or Calf Pain. Using Incentive Spirometer, PAS in place. Ambulate WBAT with pt walking 150 ft with therapy , Patient reports pain as mild    Objective: Vital signs in last 24 hours: Temp:  [97.8 F (36.6 C)-98.2 F (36.8 C)] 98.2 F (36.8 C) (12/06 0750) Pulse Rate:  [100-120] 119 (12/06 0750) Resp:  [19-23] 19 (12/06 0750) BP: (98-124)/(75-99) 98/79 (12/06 0750) SpO2:  [98 %-100 %] 100 % (12/06 0750) Weight:  [112.5 kg] 112.5 kg (12/06 0613)    Intake/Output from previous day: I/O last 3 completed shifts: In: 240 [P.O.:240] Out: 2025 [Urine:2025]   Intake/Output this shift: No intake/output data recorded.   LABORATORY DATA: Recent Labs    10/12/20 0202 10/12/20 0202 10/12/20 1275 10/12/20 1611 10/12/20 2215 10/13/20 0323 10/13/20 0620  WBC 11.7*  --   --   --   --  9.4  --   HGB 8.3*  --   --   --   --  8.5*  --   HCT 27.1*  --   --   --   --  26.0*  --   PLT 188  --   --   --   --  180  --   NA 138  --   --   --   --  138  --   K 4.4  --   --   --   --  4.1  --   CL 104  --   --   --   --  105  --   CO2 21*  --   --   --   --  20*  --   BUN 115*  --   --   --   --  111*  --   CREATININE 4.68*  --   --   --   --  4.47*  --   GLUCOSE 102*  --   --   --   --  92  --   GLUCAP  --   --    < > 109* 101*  --  81  CALCIUM 11.1*   < >  --   --   --  11.1*  --    < > = values in this interval not displayed.    Examination: Neurovascular intact Sensation intact distally Dorsiflexion/Plantar flexion intact Pt laying in bed without knee immobilizer or hip hinged abduction brace}  Assessment:   Recurrent Left Hip Dislocations  ADDITIONAL DIAGNOSIS: atrial flutter on Eliquis, CKD-5, DM-2, diastolic CHF, anemia of renal  disease  Plan:  Must apply brace again before getting OOB especially when sitting in chair as he is most prone to dislocate again when getting up from the chair.  Still needs PT for ambulation/education.  If pt can not tolerate brace at night, then he must be in a knee immobilizer.  This has been reiterated to the pt and her stated he understood.    Joanell Rising 10/13/2020, 9:03 AM

## 2020-10-13 NOTE — Progress Notes (Signed)
PROGRESS NOTE  Mark [name] DUK:[id number] DOB: 08/30/1961   PCP: Delrae Rend, MD  Patient is from: Home.  Lives alone.  DOA: 10/08/2020 LOS: 3  Chief complaints: Left hip pain  Brief Narrative / Interim history: 59 year old male with history of atrial flutter on Eliquis, CKD-5, DM-2, diastolic CHF, anemia of renal disease and recurrent left hip dislocation presenting with left hip pain.  Imaging revealed left hip dislocation that was reduced by orthopedic surgery.  He was also in atrial flutter with RVR and started on IV Cardizem.  Cardiology consulted.  Eventually, heart rate improved.  He was transitioned to p.o. metoprolol.  Cardiology adjusting metoprolol dose for better rate control which is limited by soft blood pressures.  Cardiology thinking about TEE cardioversion this admission if heart rate remains elevated.  Therapy recommended SNF.   Subjective: Seen and examined earlier this morning.  No major events overnight or this morning.  No complaints.  He denies chest pain, dyspnea, dizziness, lightheadedness, GI or UTI symptoms.  He felt some palpitation intermittently.   Objective: Vitals:   10/13/20 0313 10/13/20 0613 10/13/20 0749 10/13/20 0750  BP: (!) 123/93  98/79 98/79  Pulse: (!) 106  (!) 120 (!) 119  Resp: 20 (!) 23 20 19   Temp: 97.9 F (36.6 C)  98.2 F (36.8 C) 98.2 F (36.8 C)  TempSrc: Oral  Oral   SpO2: 100%  100% 100%  Weight:  112.5 kg    Height:        Intake/Output Summary (Last 24 hours) at 10/13/2020 1228 Last data filed at 10/13/2020 0900 Gross per 24 hour  Intake 160 ml  Output 2150 ml  Net -1990 ml   Filed Weights   10/10/20 0416 10/11/20 0454 10/13/20 3762  Weight: 120.4 kg 115.1 kg 112.5 kg    Examination:  GENERAL: No apparent distress.  Nontoxic. HEENT: MMM.  Vision and hearing grossly intact.  NECK: Supple.  No apparent JVD.  RESP: On RA.  No IWOB.  Fair aeration bilaterally. CVS: HR 110-120s. Heart sounds normal.  ABD/GI/GU:  BS+. Abd soft, NTND.  MSK/EXT: Hardly moves LLE SKIN: no apparent skin lesion or wound NEURO: Awake, alert and oriented appropriately.  No apparent focal neuro deficit other than LLE weakness PSYCH: Calm. Normal affect.  Procedures:  Closed reduction of left hip  Microbiology summarized: COVID-19 and influenza PCR nonreactive.  Assessment & Plan: Atrial flutter with RVR: RVR improved.  HR 110-120.  TTE with LVEF of 60 to 65%, severe LAE, but no significant valvular disease. -Cardiology managing   -Increase metoprolol XL to 75 mg daily (on 100 mg twice daily at home).  -On Eliquis for anticoagulation.  -Possible TEE cardioversion with HR not well controlled -Optimize electrolytes-replacing magnesium.  Recurrent left hip dislocation: s/p closed hip reduction by orthopedic surgery on 12/2. -Ortho recommended hinged abduction brace set to 60 degrees flexion and 15 degrees abduction  -Knee immobilizer if difficulty tolerating brace at night. -Outpatient follow-up with Ortho in 1 week -Therapy recommended SNF given risk for fall and recurrent dislocation.  He lives alone.  CKD-5/azotemia: Renal function at baseline.  Has AV fistula placed several years ago but never been on HD.  Followed by Dr. Posey Pronto.  Not uremic.  No hyperkalemia.  Creatinine is slightly improved. -Continue monitoring  Hypercalcemia: Ca 11.1 and corrects to 12 for hypoalbuminemia.  Seems chronic -Check PTH and PTHrp  Mild hyperphosphatemia: P 5.0. -Recheck in the morning -May need Phos binder and nephrology involvement if no  improvement  Anemia of renal disease: Hgb down to 7.2.  Baseline Hgb 8-9.  Transfused 1 unit and responded appropriately.  Anemia panel consistent with anemia of chronic disease -Continue monitoring -Continue p.o. ferrous sulfate  Hyperkalemia: Resolved after Lokelma Lasix.  Metabolic acidosis: Bicarb 20.  Improved.  Likely due to renal failure. -Continue oral bicarb replacement  Acute  on chronic diastolic congestive heart failure: Diuresed with IV Lasix and transition to p.o.  TTE as above.  Heart 2 L UOP/24 hours.  Net -3 L charted.  Fairly euvolemic on exam now. -Continue p.o. Lasix 40 mg twice daily -Monitor fluid status and renal functions  Controlled DM-2 with CKD-5: A1c 5.5%. Recent Labs  Lab 10/12/20 1118 10/12/20 1611 10/12/20 2215 10/13/20 0620 10/13/20 1132  GLUCAP 118* 109* 101* 81 88  -Discontinue CBG monitoring and SSI insulin  Morbid obesity in the setting of diabetes Body mass index is 36.62 kg/m.  -Encourage lifestyle change to lose weight  Debility/physical deconditioning -Continue PT/OT-eventual SNF.       DVT prophylaxis:   apixaban (ELIQUIS) tablet 5 mg  Code Status: Full code Family Communication: Patient and/or RN. Available if any question.  Status is: Inpatient  Remains inpatient appropriate because:Hemodynamically unstable   Dispo: The patient is from: Home              Anticipated d/c is to: SNF              Anticipated d/c date is: 2 days              Patient currently is not medically stable to d/c.       Consultants:  Cardiology Orthopedic surgery   Sch Meds:  Scheduled Meds: . sodium chloride   Intravenous Once  . acidophilus  1 capsule Oral Daily  . allopurinol  50 mg Oral Q M,W,F  . apixaban  5 mg Oral BID  . ferrous sulfate  325 mg Oral Q breakfast  . furosemide  40 mg Oral BID  . insulin aspart  0-6 Units Subcutaneous TID WC  . metoprolol succinate  25 mg Oral Once  . [START ON 10/14/2020] metoprolol succinate  75 mg Oral Daily  . polyethylene glycol  17 g Oral Daily  . sodium bicarbonate  650 mg Oral TID  . vitamin B-12  1,000 mcg Oral Daily   Continuous Infusions: PRN Meds:.acetaminophen **OR** acetaminophen  Antimicrobials: Anti-infectives (From admission, onward)   None       I have personally reviewed the following labs and images: CBC: Recent Labs  Lab 10/08/20 1446  10/08/20 1446 10/09/20 0343 10/10/20 0150 10/11/20 0154 10/12/20 0202 10/13/20 0323  WBC 8.7   < > 9.0 10.7* 12.0* 11.7* 9.4  NEUTROABS 7.0  --   --   --   --   --   --   HGB 8.0*   < > 7.8* 7.2* 8.9* 8.3* 8.5*  HCT 26.8*   < > 25.2* 24.1* 29.1* 27.1* 26.0*  MCV 106.8*   < > 104.6* 106.6* 102.5* 102.3* 98.9  PLT 181   < > 181 123* 168 188 180   < > = values in this interval not displayed.   BMP &GFR Recent Labs  Lab 10/09/20 0343 10/10/20 0150 10/11/20 0154 10/12/20 0202 10/13/20 0323  NA 143 139 138 138 138  K 5.4* 5.2* 4.4 4.4 4.1  CL 111 109 106 104 105  CO2 16* 15* 17* 21* 20*  GLUCOSE 113* 111* 99 102* 92  BUN 106* 112* 113* 115* 111*  CREATININE 4.62* 4.92* 4.94* 4.68* 4.47*  CALCIUM 11.4* 11.2* 10.7* 11.1* 11.1*  MG  --   --   --   --  1.6*  PHOS  --  8.2*  --   --  5.0*   Estimated Creatinine Clearance: 21.5 mL/min (A) (by C-G formula based on SCr of 4.47 mg/dL (H)). Liver & Pancreas: Recent Labs  Lab 10/10/20 0150 10/13/20 0323  ALBUMIN 2.8* 2.9*   No results for input(s): LIPASE, AMYLASE in the last 168 hours. No results for input(s): AMMONIA in the last 168 hours. Diabetic: Recent Labs    10/11/20 1430  HGBA1C 5.5   Recent Labs  Lab 10/12/20 1118 10/12/20 1611 10/12/20 2215 10/13/20 0620 10/13/20 1132  GLUCAP 118* 109* 101* 81 88   Cardiac Enzymes: Recent Labs  Lab 10/08/20 1446  CKTOTAL 124   No results for input(s): PROBNP in the last 8760 hours. Coagulation Profile: No results for input(s): INR, PROTIME in the last 168 hours. Thyroid Function Tests: No results for input(s): TSH, T4TOTAL, FREET4, T3FREE, THYROIDAB in the last 72 hours. Lipid Profile: No results for input(s): CHOL, HDL, LDLCALC, TRIG, CHOLHDL, LDLDIRECT in the last 72 hours. Anemia Panel: Recent Labs    10/13/20 0323  VITAMINB12 4,049*  FOLATE 10.8  FERRITIN 599*  TIBC 216*  IRON 28*  RETICCTPCT 2.7   Urine analysis:    Component Value Date/Time    COLORURINE YELLOW 07/26/2020 [place] 07/26/2020 1244   LABSPEC 1.012 07/26/2020 1244   PHURINE 5.0 07/26/2020 1244   GLUCOSEU NEGATIVE 07/26/2020 1244   HGBUR MODERATE (A) 07/26/2020 1244   BILIRUBINUR NEGATIVE 07/26/2020 1244   KETONESUR NEGATIVE 07/26/2020 1244   PROTEINUR 100 (A) 07/26/2020 1244   UROBILINOGEN 0.2 09/27/2011 1317   NITRITE NEGATIVE 07/26/2020 1244   LEUKOCYTESUR NEGATIVE 07/26/2020 1244   Sepsis Labs: Invalid input(s): PROCALCITONIN, [place]  Microbiology: Recent Results (from the past 240 hour(s))  Resp Panel by RT-PCR (Flu A&B, Covid) Nasopharyngeal Swab     Status: None   Collection Time: 10/08/20  6:19 PM   Specimen: Nasopharyngeal Swab; Nasopharyngeal(NP) swabs in vial transport medium  Result Value Ref Range Status   SARS Coronavirus 2 by RT PCR NEGATIVE NEGATIVE Final    Comment: (NOTE) SARS-CoV-2 target nucleic acids are NOT DETECTED.  The SARS-CoV-2 RNA is generally detectable in upper respiratory specimens during the acute phase of infection. The lowest concentration of SARS-CoV-2 viral copies this assay can detect is 138 copies/mL. A negative result does not preclude SARS-Cov-2 infection and should not be used as the sole basis for treatment or other patient management decisions. A negative result may occur with  improper specimen collection/handling, submission of specimen other than nasopharyngeal swab, presence of viral mutation(s) within the areas targeted by this assay, and inadequate number of viral copies(<138 copies/mL). A negative result must be combined with clinical observations, patient history, and epidemiological information. The expected result is Negative.  Fact Sheet for Patients:  EntrepreneurPulse.com.au  Fact Sheet for Healthcare Providers:  IncredibleEmployment.be  This test is no t yet approved or cleared by the [place] FDA and  has been authorized for  detection and/or diagnosis of SARS-CoV-2 by FDA under an Emergency Use Authorization (EUA). This EUA will remain  in effect (meaning this test can be used) for the duration of the COVID-19 declaration under Section 564(b)(1) of the Act, 21 U.S.C.section 360bbb-3(b)(1), unless the authorization is terminated  or revoked sooner.  Influenza A by PCR NEGATIVE NEGATIVE Final   Influenza B by PCR NEGATIVE NEGATIVE Final    Comment: (NOTE) The Xpert Xpress SARS-CoV-2/FLU/RSV plus assay is intended as an aid in the diagnosis of influenza from Nasopharyngeal swab specimens and should not be used as a sole basis for treatment. Nasal washings and aspirates are unacceptable for Xpert Xpress SARS-CoV-2/FLU/RSV testing.  Fact Sheet for Patients: EntrepreneurPulse.com.au  Fact Sheet for Healthcare Providers: IncredibleEmployment.be  This test is not yet approved or cleared by the [place] FDA and has been authorized for detection and/or diagnosis of SARS-CoV-2 by FDA under an Emergency Use Authorization (EUA). This EUA will remain in effect (meaning this test can be used) for the duration of the COVID-19 declaration under Section 564(b)(1) of the Act, 21 U.S.C. section 360bbb-3(b)(1), unless the authorization is terminated or revoked.  Performed at [facility], [place] [address] [place], [place] [address]     Radiology Studies: No results found.     Hezikiah Retzloff T. [place]  If 7PM-7AM, please contact night-coverage [email or web address] 10/13/2020, 12:28 PM

## 2020-10-13 NOTE — Progress Notes (Signed)
Physical Therapy Treatment Patient Details Name: Mark [name] MRN: [id number] DOB: 30-Sep-1961 Today's Date: 10/13/2020    History of Present Illness Pt is a 59 y.o. male admitted for L hip dislocation. PMH consists of recurrent L hip dislocation, h/o L THA 2014, a flutter, CKD, HTN, osteoporosis, tobacco use, and arthritis.    PT Comments    Pt seated edge of bed on arrival.  Pt not wearing his brace this session.  Pt educated that he is to have hip abduction brace on when sitting or standing.  MD did write he can wear knee immobilizer in supine. Pt returned to bed and hip abduction brace donned.  Pt required assistance to mobilize and continues to benefit from snf placement.  Follow Up Recommendations  SNF     Equipment Recommendations  None recommended by PT    Recommendations for Other Services       Precautions / Restrictions Precautions Precautions: Fall;Posterior Hip Precaution Comments: strict posterior hip precautions Required Braces or Orthoses: Other Brace Other Brace: hinged abduction brace- when up walking or sitting in recliner.  He may wear knee immobilizer instead in bed for comfort. Restrictions Weight Bearing Restrictions: Yes LLE Weight Bearing: Weight bearing as tolerated    Mobility  Bed Mobility Overal bed mobility: Needs Assistance Bed Mobility: Supine to Sit Rolling: Min assist (pillow between knees)   Supine to sit: Min assist     General bed mobility comments: Min assistance to roll and to move LE to edge of bed to come to sitting.  Pt required placement of hip abduction brace in supine  Transfers Overall transfer level: Needs assistance Equipment used: Rolling walker (2 wheeled) Transfers: Sit to/from Stand Sit to Stand: Min guard         General transfer comment: Cues for hand placement to and from seated surface.  Cues to keep L LE forward to maintain hip precautions.  Ambulation/Gait Ambulation/Gait assistance: Min guard Gait  Distance (Feet): 120 Feet Assistive device: Rolling walker (2 wheeled) Gait Pattern/deviations: Step-to pattern;Decreased stride length;Trunk flexed Gait velocity: decreased   General Gait Details: Cues for upper trunk control and RW safety.  Adjusted RW for comfort to allow for UE use to reduce discomfort.   Stairs             Wheelchair Mobility    Modified Rankin (Stroke Patients Only)       Balance Overall balance assessment: Needs assistance Sitting-balance support: No upper extremity supported;Feet supported Sitting balance-Leahy Scale: Good       Standing balance-Leahy Scale: Fair                              Cognition Arousal/Alertness: Awake/alert;Lethargic Behavior During Therapy: WFL for tasks assessed/performed Overall Cognitive Status: Impaired/Different from baseline Area of Impairment: Problem solving;Memory                     Memory: Decreased recall of precautions       Problem Solving: Slow processing        Exercises      General Comments        Pertinent Vitals/Pain Pain Assessment: Faces Faces Pain Scale: Hurts a little bit Pain Location: L hip Pain Descriptors / Indicators: Discomfort Pain Intervention(s): Monitored during session;Repositioned    Home Living                      Prior Function  PT Goals (current goals can now be found in the care plan section) Acute Rehab PT Goals Patient Stated Goal: home Potential to Achieve Goals: Good Progress towards PT goals: Progressing toward goals    Frequency    Min 5X/week      PT Plan Current plan remains appropriate    Co-evaluation              AM-PAC PT "6 Clicks" Mobility   Outcome Measure  Help needed turning from your back to your side while in a flat bed without using bedrails?: A Little Help needed moving from lying on your back to sitting on the side of a flat bed without using bedrails?: A Little Help  needed moving to and from a bed to a chair (including a wheelchair)?: A Little Help needed standing up from a chair using your arms (e.g., wheelchair or bedside chair)?: A Little Help needed to walk in hospital room?: A Little Help needed climbing 3-5 steps with a railing? : A Lot 6 Click Score: 17    End of Session Equipment Utilized During Treatment: Gait belt (hip abduction brace) Activity Tolerance: Patient tolerated treatment well Patient left: in bed;with call bell/phone within reach;with bed alarm set Nurse Communication: Mobility status;Other (comment) (confusion) PT Visit Diagnosis: Difficulty in walking, not elsewhere classified (R26.2)     Time: 2841-3244 PT Time Calculation (min) (ACUTE ONLY): 31 min  Charges:  $Gait Training: 8-22 mins $Therapeutic Activity: 8-22 mins                     Erasmo Leventhal , PTA Acute Rehabilitation Services Pager [phone] Office [phone]     Rogena Deupree Eli Hose 10/13/2020, 5:35 PM

## 2020-10-13 NOTE — Progress Notes (Signed)
Progress Note  Patient Name: Mark [name] Date of Encounter: 10/13/2020  [facility] Cardiologist: No primary care provider on file.   Subjective   Feeling tired.  Gets short of breath with ambulation.  Inpatient Medications    Scheduled Meds: . sodium chloride   Intravenous Once  . acidophilus  1 capsule Oral Daily  . allopurinol  50 mg Oral Q M,W,F  . apixaban  5 mg Oral BID  . ferrous sulfate  325 mg Oral Q breakfast  . furosemide  40 mg Oral BID  . insulin aspart  0-6 Units Subcutaneous TID WC  . metoprolol succinate  50 mg Oral Daily  . polyethylene glycol  17 g Oral Daily  . sodium bicarbonate  650 mg Oral TID  . vitamin B-12  1,000 mcg Oral Daily   Continuous Infusions:  PRN Meds: acetaminophen **OR** acetaminophen   Vital Signs    Vitals:   10/13/20 0313 10/13/20 0613 10/13/20 0749 10/13/20 0750  BP: (!) 123/93  98/79 98/79  Pulse: (!) 106  (!) 120 (!) 119  Resp: 20 (!) 23 20 19   Temp: 97.9 F (36.6 C)  98.2 F (36.8 C) 98.2 F (36.8 C)  TempSrc: Oral  Oral   SpO2: 100%  100% 100%  Weight:  112.5 kg    Height:        Intake/Output Summary (Last 24 hours) at 10/13/2020 1101 Last data filed at 10/13/2020 0900 Gross per 24 hour  Intake 400 ml  Output 2350 ml  Net -1950 ml   Last 3 Weights 10/13/2020 10/11/2020 10/10/2020  Weight (lbs) 248 lb 253 lb 11.2 oz 265 lb 6.9 oz  Weight (kg) 112.492 kg 115.078 kg 120.4 kg      Telemetry   Atrial flutter.  Rates around 120 bpm.- Personally Reviewed  ECG    n/a - Personally Reviewed  Physical Exam   VS:  BP 98/79   Pulse (!) 119   Temp 98.2 F (36.8 C)   Resp 19   Ht 5\' 9"  (1.753 m)   Wt 112.5 kg   SpO2 100%   BMI 36.62 kg/m  , BMI Body mass index is 36.62 kg/m. GENERAL:  Well appearing HEENT: Pupils equal round and reactive, fundi not visualized, oral mucosa unremarkable NECK:  No jugular venous distention, waveform within normal limits, carotid upstroke brisk and symmetric, no  bruits, LUNGS:  Clear to auscultation bilaterally HEART:  Tachycardic.  Regula rhythm.  PMI not displaced or sustained,S1 and S2 within normal limits, no S3, no S4, no clicks, no rubs, III/VI systoic murmur ABD:  Flat, positive bowel sounds normal in frequency in pitch, no bruits, no rebound, no guarding, no midline pulsatile mass, no hepatomegaly, no splenomegaly EXT:  2 plus pulses throughout, no edema, no cyanosis no clubbing SKIN:  No rashes no nodules NEURO:  Cranial nerves II through XII grossly intact, motor grossly intact throughout [facility]:  Cognitively intact, oriented to person place and time   Labs    High Sensitivity Troponin:   Recent Labs  Lab 10/08/20 2121 10/08/20 2337  TROPONINIHS 84* 79*      Chemistry Recent Labs  Lab 10/10/20 0150 10/10/20 0150 10/11/20 0154 10/12/20 0202 10/13/20 0323  NA 139   < > 138 138 138  K 5.2*   < > 4.4 4.4 4.1  CL 109   < > 106 104 105  CO2 15*   < > 17* 21* 20*  GLUCOSE 111*   < >  99 102* 92  BUN 112*   < > 113* 115* 111*  CREATININE 4.92*   < > 4.94* 4.68* 4.47*  CALCIUM 11.2*   < > 10.7* 11.1* 11.1*  ALBUMIN 2.8*  --   --   --  2.9*  GFRNONAA 13*   < > 13* 13* 14*  ANIONGAP 15   < > 15 13 13    < > = values in this interval not displayed.     Hematology Recent Labs  Lab 10/11/20 0154 10/11/20 0154 10/12/20 0202 10/13/20 0323  WBC 12.0*  --  11.7* 9.4  RBC 2.84*   < > 2.65* 2.63*  2.64*  HGB 8.9*  --  8.3* 8.5*  HCT 29.1*  --  27.1* 26.0*  MCV 102.5*  --  102.3* 98.9  MCH 31.3  --  31.3 32.3  MCHC 30.6  --  30.6 32.7  RDW 16.2*  --  16.0* 15.7*  PLT 168  --  188 180   < > = values in this interval not displayed.    BNPNo results for input(s): BNP, PROBNP in the last 168 hours.   DDimer No results for input(s): DDIMER in the last 168 hours.   Radiology    No results found.  Cardiac Studies   Echo 10/09/2020 1. No subcostal views.  2. Left ventricular ejection fraction, by estimation, is 60 to 65%.  The  left ventricle has normal function. The left ventricle has no regional  wall motion abnormalities. There is mild left ventricular hypertrophy.  Left ventricular diastolic parameters  are indeterminate.  3. Right ventricular systolic function is normal. The right ventricular  size is normal.  4. Left atrial size was severely dilated.  5. The mitral valve is normal in structure. Mild mitral valve  regurgitation. No evidence of mitral stenosis. Severe mitral annular  calcification.  6. The aortic valve is normal in structure. Aortic valve regurgitation is  trivial. Moderate aortic valve stenosis.   Patient Profile     59 y.o. male with moderate aortic stenosis, CKD V, hypertension, gout, atrial flutter, and recurrent hip dislocations admitted with hip dislocation and atrial flutter with RVR.  Assessment & Plan    # Atrial flutter with RVR:  Newly diagnosed 07/2020.  There were plans for outpatient cardioversion but Eliquis was not properly continued.  Heart rate is higher today.  We will try to increase his metoprolol to 75 mg daily.  However his blood pressure has been soft at times.  It is currently back up in the 120s.  If his heart rates remain elevated we may need to go ahead with TEE cardioversion this admission.  Continue Eliquis.  # Moderate aortic stenosis:  Continue to monitor.  Mean gradient 29 mmHg.  # Hip dislocation: Recurrent problem.  Bracing recommended by orthopedics.   # CKD 5:  # Chronic anemia: No active bleeding.  H/h stable at 8.5 after transfusion.  Continue Eliquis and monitor closely.   # DM:  A1c 5.5%.       For questions or updates, please contact [place] Please consult [email or web address] for contact info under        Signed, Skeet Latch, MD  10/13/2020, 11:01 AM

## 2020-10-14 DIAGNOSIS — I4892 Unspecified atrial flutter: Secondary | ICD-10-CM | POA: Diagnosis not present

## 2020-10-14 DIAGNOSIS — E210 Primary hyperparathyroidism: Secondary | ICD-10-CM

## 2020-10-14 DIAGNOSIS — S73005D Unspecified dislocation of left hip, subsequent encounter: Secondary | ICD-10-CM

## 2020-10-14 DIAGNOSIS — N184 Chronic kidney disease, stage 4 (severe): Secondary | ICD-10-CM | POA: Diagnosis not present

## 2020-10-14 LAB — PTH, INTACT AND CALCIUM
Calcium, Total (PTH): 11 mg/dL — ABNORMAL HIGH (ref 8.6–10.2)
PTH: 214 pg/mL — ABNORMAL HIGH (ref 15–65)

## 2020-10-14 LAB — CBC
HCT: 28.2 % — ABNORMAL LOW (ref 39.0–52.0)
Hemoglobin: 8.7 g/dL — ABNORMAL LOW (ref 13.0–17.0)
MCH: 31.1 pg (ref 26.0–34.0)
MCHC: 30.9 g/dL (ref 30.0–36.0)
MCV: 100.7 fL — ABNORMAL HIGH (ref 80.0–100.0)
Platelets: 177 10*3/uL (ref 150–400)
RBC: 2.8 MIL/uL — ABNORMAL LOW (ref 4.22–5.81)
RDW: 15.8 % — ABNORMAL HIGH (ref 11.5–15.5)
WBC: 8.6 10*3/uL (ref 4.0–10.5)
nRBC: 0 % (ref 0.0–0.2)

## 2020-10-14 LAB — RENAL FUNCTION PANEL
Albumin: 2.8 g/dL — ABNORMAL LOW (ref 3.5–5.0)
Anion gap: 13 (ref 5–15)
BUN: 102 mg/dL — ABNORMAL HIGH (ref 8–23)
CO2: 22 mmol/L (ref 22–32)
Calcium: 11 mg/dL — ABNORMAL HIGH (ref 8.9–10.3)
Chloride: 104 mmol/L (ref 98–111)
Creatinine, Ser: 4.55 mg/dL — ABNORMAL HIGH (ref 0.61–1.24)
GFR, Estimated: 14 mL/min — ABNORMAL LOW (ref >60)
Glucose, Bld: 101 mg/dL — ABNORMAL HIGH (ref 70–99)
Phosphorus: 5.2 mg/dL — ABNORMAL HIGH (ref 2.5–4.6)
Potassium: 3.9 mmol/L (ref 3.5–5.1)
Sodium: 139 mmol/L (ref 135–145)

## 2020-10-14 LAB — SARS CORONAVIRUS 2 BY RT PCR (HOSPITAL ORDER, PERFORMED IN ~~LOC~~ HOSPITAL LAB): SARS Coronavirus 2: NEGATIVE

## 2020-10-14 LAB — MAGNESIUM: Magnesium: 1.9 mg/dL (ref 1.7–2.4)

## 2020-10-14 LAB — VITAMIN D 25 HYDROXY (VIT D DEFICIENCY, FRACTURES): Vit D, 25-Hydroxy: 52.42 ng/mL (ref 30–100)

## 2020-10-14 MED ORDER — HYDROCERIN EX CREA
1.0000 "application " | TOPICAL_CREAM | Freq: Two times a day (BID) | CUTANEOUS | Status: DC
  Administered 2020-10-14 – 2020-10-16 (×4): 1 via TOPICAL
  Filled 2020-10-14: qty 113

## 2020-10-14 MED ORDER — METOPROLOL SUCCINATE ER 100 MG PO TB24
100.0000 mg | ORAL_TABLET | Freq: Every day | ORAL | Status: DC
  Administered 2020-10-15 – 2020-10-16 (×2): 100 mg via ORAL
  Filled 2020-10-14 (×2): qty 1

## 2020-10-14 MED ORDER — METOPROLOL SUCCINATE ER 25 MG PO TB24
25.0000 mg | ORAL_TABLET | Freq: Once | ORAL | Status: AC
  Administered 2020-10-14: 25 mg via ORAL
  Filled 2020-10-14: qty 1

## 2020-10-14 NOTE — TOC Initial Note (Signed)
Transition of Care [facility]) - Initial/Assessment Note    Patient Details  Name: Mark [name] MRN: [id number] Date of Birth: [birth year]  Transition of Care [facility] CM/SW Contact:    Mark [name], [place] Phone Number: 10/14/2020, 2:15 PM  Clinical Narrative:                  CSW met with patient at bedside. CSW introduced self and explained role. CSW discussed with patient PT recommendation of short term rehab at [facility]. Patient states he lives at home alone and is agreeable to SNF placement. Patient states she has been to [facility] (in September) and would like to go back there "it's close to the hospital'. CSW was given permission to send referrals in the [place] area as back up to [place]. CSW explained the SNF process. Patient has received covid vaccine. No question or concerns noted at this time.   CSW has contacted [facility]-waiting on response  CSW has started insurance Cabin crew updated and covid test requested  Mark [name], MSW, [employer] Clinical Social Worker     Expected Discharge Plan: Skilled Nursing Facility Barriers to Discharge: Continued Medical Work up, Ship broker, SNF Pending bed offer   Patient Goals and CMS Choice        Expected Discharge Plan and Services Expected Discharge Plan: [place] In-house Referral: Clinical Social Work                                            Prior Living Arrangements/Services   Lives with:: Self Patient language and need for interpreter reviewed:: No        Need for Family Participation in Patient Care: Yes (Comment) Care giver support system in place?: Yes (comment)   Criminal Activity/Legal Involvement Pertinent to Current Situation/Hospitalization: No - Comment as needed  Activities of Daily Living Home Assistive Devices/Equipment: Cane (specify quad or straight) ADL Screening (condition at time of admission) Patient's cognitive ability adequate to safely complete  daily activities?: Yes Is the patient deaf or have difficulty hearing?: No Does the patient have difficulty seeing, even when wearing glasses/contacts?: No Does the patient have difficulty concentrating, remembering, or making decisions?: No Patient able to express need for assistance with ADLs?: Yes Does the patient have difficulty dressing or bathing?: No Independently performs ADLs?: Yes (appropriate for developmental age) Does the patient have difficulty walking or climbing stairs?: Yes Weakness of Legs: Left Weakness of Arms/Hands: None  Permission Sought/Granted   Permission granted to share information with : Yes, Verbal Permission Granted  Share Information with NAME: Mark [name]  Permission granted to share info w AGENCY: SNFs  Permission granted to share info w Relationship: brother  Permission granted to share info w Contact Information: [phone]  Emotional Assessment Appearance:: Appears stated age Attitude/Demeanor/Rapport: Engaged Affect (typically observed): Accepting, Appropriate Orientation: : Oriented to Self, Oriented to Place, Oriented to  Time, Oriented to Situation Alcohol / Substance Use: Not Applicable Psych Involvement: No (comment)  Admission diagnosis:  Atrial flutter (HCC) [I48.92] SOB (shortness of breath) [R06.02] Atrial flutter with rapid ventricular response (HCC) [I48.92] Dislocation of left hip, initial encounter [facility]) [S73.005A] Patient Active Problem List   Diagnosis Date Noted  . Atrial flutter ([place]) 10/08/2020  . Atrial flutter with rapid ventricular response ([place]) 07/28/2020  . Sepsis ([place]) 07/26/2020  . Closed dislocation of left hip ([place]) 07/26/2020  . CKD (  chronic kidney disease), stage IV ([place]) 07/26/2020  . Gout 07/26/2020  . Pneumonia 07/26/2020  . Diverticula of colon 04/29/2020  . Acute renal failure superimposed on chronic kidney disease ([place]) 04/29/2020  . Diverticulitis   . Sinus tachycardia 10/21/2015  . DM (diabetes  mellitus) ([place]) 10/09/2011  . Benign essential HTN 10/09/2011  . Post-traumatic osteoarthritis of left hip 10/06/2011   PCP:  Delrae Rend, MD Pharmacy:   [id number] [place] [id number]: [phone]: [phone]    Social Determinants of Health (SDOH) Interventions    Readmission Risk Interventions No flowsheet data found.

## 2020-10-14 NOTE — Progress Notes (Signed)
Progress Note  Patient Name: Mark [name] Date of Encounter: 10/14/2020  [facility] Cardiologist: No primary care provider on file.   Subjective   Feeling tired.  No palpitations at rest.  He got short of breath walking down the hallway.  He does feel his heart racing more when he walks but not at rest.  Denies lightheadedness or dizziness.  Inpatient Medications    Scheduled Meds: . sodium chloride   Intravenous Once  . acidophilus  1 capsule Oral Daily  . allopurinol  50 mg Oral Q M,W,F  . apixaban  5 mg Oral BID  . ferrous sulfate  325 mg Oral Q breakfast  . furosemide  40 mg Oral BID  . metoprolol succinate  75 mg Oral Daily  . sodium bicarbonate  650 mg Oral TID  . vitamin B-12  1,000 mcg Oral Daily   Continuous Infusions:  PRN Meds: acetaminophen **OR** acetaminophen, polyethylene glycol, senna-docusate   Vital Signs    Vitals:   10/13/20 1950 10/13/20 2314 10/14/20 0434 10/14/20 0742  BP: 114/90 (!) 112/93 117/86 107/78  Pulse: 99 (!) 116 (!) 109 100  Resp: 18 17 18 14   Temp: 97.8 F (36.6 C) 97.8 F (36.6 C) 98 F (36.7 C) 97.6 F (36.4 C)  TempSrc: Oral Oral Oral Oral  SpO2: 100% 94% 96% 98%  Weight:   113.5 kg   Height:        Intake/Output Summary (Last 24 hours) at 10/14/2020 1053 Last data filed at 10/14/2020 1000 Gross per 24 hour  Intake 240 ml  Output 1625 ml  Net -1385 ml   Last 3 Weights 10/14/2020 10/13/2020 10/11/2020  Weight (lbs) 250 lb 3.2 oz 248 lb 253 lb 11.2 oz  Weight (kg) 113.49 kg 112.492 kg 115.078 kg      Telemetry   Atrial flutter.  Rates around 120 bpm.- Personally Reviewed  ECG    n/a - Personally Reviewed  Physical Exam   VS:  BP 107/78 (BP Location: Right Arm)   Pulse 100   Temp 97.6 F (36.4 C) (Oral)   Resp 14   Ht 5\' 9"  (1.753 m)   Wt 113.5 kg   SpO2 98%   BMI 36.95 kg/m  , BMI Body mass index is 36.95 kg/m. GENERAL:  Well appearing HEENT: Pupils equal round and reactive, fundi not  visualized, oral mucosa unremarkable NECK:  No jugular venous distention, waveform within normal limits, carotid upstroke brisk and symmetric, no bruits, LUNGS:  Clear to auscultation bilaterally HEART:  Tachycardic.  Irregularly irregular.  PMI not displaced or sustained,S1 and S2 within normal limits, no S3, no S4, no clicks, no rubs, III/VI systoic murmur ABD:  Flat, positive bowel sounds normal in frequency in pitch, no bruits, no rebound, no guarding, no midline pulsatile mass, no hepatomegaly, no splenomegaly EXT:  2 plus pulses throughout, no edema, no cyanosis no clubbing SKIN:  No rashes no nodules NEURO:  Cranial nerves II through XII grossly intact, motor grossly intact throughout PSYCH:  Cognitively intact, oriented to person place and time   Labs    High Sensitivity Troponin:   Recent Labs  Lab 10/08/20 2121 10/08/20 2337  TROPONINIHS 84* 79*      Chemistry Recent Labs  Lab 10/10/20 0150 10/11/20 0154 10/12/20 0202 10/12/20 0202 10/13/20 0323 10/13/20 0805 10/14/20 0157  NA 139   < > 138  --  138  --  139  K 5.2*   < > 4.4  --  4.1  --  3.9  CL 109   < > 104  --  105  --  104  CO2 15*   < > 21*  --  20*  --  22  GLUCOSE 111*   < > 102*  --  92  --  101*  BUN 112*   < > 115*  --  111*  --  102*  CREATININE 4.92*   < > 4.68*  --  4.47*  --  4.55*  CALCIUM 11.2*   < > 11.1*   < > 11.1* 11.0* 11.0*  ALBUMIN 2.8*  --   --   --  2.9*  --  2.8*  GFRNONAA 13*   < > 13*  --  14*  --  14*  ANIONGAP 15   < > 13  --  13  --  13   < > = values in this interval not displayed.     Hematology Recent Labs  Lab 10/12/20 0202 10/13/20 0323 10/14/20 0157  WBC 11.7* 9.4 8.6  RBC 2.65* 2.63*  2.64* 2.80*  HGB 8.3* 8.5* 8.7*  HCT 27.1* 26.0* 28.2*  MCV 102.3* 98.9 100.7*  MCH 31.3 32.3 31.1  MCHC 30.6 32.7 30.9  RDW 16.0* 15.7* 15.8*  PLT 188 180 177    BNPNo results for input(s): BNP, PROBNP in the last 168 hours.   DDimer No results for input(s): DDIMER in the  last 168 hours.   Radiology    No results found.  Cardiac Studies   Echo 10/09/2020 1. No subcostal views.  2. Left ventricular ejection fraction, by estimation, is 60 to 65%. The  left ventricle has normal function. The left ventricle has no regional  wall motion abnormalities. There is mild left ventricular hypertrophy.  Left ventricular diastolic parameters  are indeterminate.  3. Right ventricular systolic function is normal. The right ventricular  size is normal.  4. Left atrial size was severely dilated.  5. The mitral valve is normal in structure. Mild mitral valve  regurgitation. No evidence of mitral stenosis. Severe mitral annular  calcification.  6. The aortic valve is normal in structure. Aortic valve regurgitation is  trivial. Moderate aortic valve stenosis.   Patient Profile     59 y.o. male with moderate aortic stenosis, CKD V, hypertension, gout, atrial flutter, and recurrent hip dislocations admitted with hip dislocation and atrial flutter with RVR.  Assessment & Plan    # Atrial flutter with RVR:  Newly diagnosed 07/2020.  There were plans for outpatient cardioversion but Eliquis was not properly continued.  Heart rate is improving with increased dose of metoprolol but is still above goal.  We will increase to 100 mg daily.  Continue Eliquis.  Plan for outpatient cardioversion 11/2020.  # Moderate aortic stenosis:  Continue to monitor.  Mean gradient 29 mmHg.  # Hip dislocation: Recurrent problem.  Bracing recommended by orthopedics.   # CKD 5:  # Chronic anemia: No active bleeding.  H/h stable at 8.5 after transfusion.  Continue Eliquis and monitor closely.   # DM:  A1c 5.5%.       For questions or updates, please contact [place] Please consult [email or web address] for contact info under        Signed, Skeet Latch, MD  10/14/2020, 10:53 AM

## 2020-10-14 NOTE — Progress Notes (Signed)
Physical Therapy Treatment Patient Details Name: Mark [name] MRN: [id number] DOB: [birth year] Today's Date: 10/14/2020    History of Present Illness Pt is a 59 y.o. male admitted for L hip dislocation. PMH consists of recurrent L hip dislocation, h/o L THA 2014, a flutter, CKD, HTN, osteoporosis, tobacco use, and arthritis.    PT Comments    Pt supine in bed with hip AB brace on, with c/o of itching. Pt states RN aware. Pt reports that he is okay to go for a walk to take his mind of the itching. Pt is able to recall 2/3 hip precautions and the 3rd with prompting. Pt is min guard for transfers and ambulation of 150 feet. VC for sequencing with turns to maintain hip precautions. D/c plan remains appropriate. PT will continue to follow acutely.      Follow Up Recommendations  SNF     Equipment Recommendations  None recommended by PT       Precautions / Restrictions Precautions Precautions: Fall;Posterior Hip Precaution Comments: strict posterior hip precautions Required Braces or Orthoses: Other Brace Other Brace: hinged abduction brace Restrictions Weight Bearing Restrictions: Yes LLE Weight Bearing: Weight bearing as tolerated    Mobility  Bed Mobility Overal bed mobility: Needs Assistance Bed Mobility: Supine to Sit;Sit to Supine     Supine to sit: HOB elevated;Min guard Sit to supine: HOB elevated;Min guard   General bed mobility comments: HoB elevated min guard for pt to  heliocopter LE around to EoB, min guard for bringing LE back into bed and for scooting up in bed with use of bedrails  Transfers Overall transfer level: Needs assistance Equipment used: Rolling walker (2 wheeled) Transfers: Sit to/from Stand Sit to Stand: Min guard         General transfer comment: increased time and effort, good hand placement and alignment of L LE for power up   Ambulation/Gait Ambulation/Gait assistance: Min guard Gait Distance (Feet): 150 Feet Assistive device:  Rolling walker (2 wheeled) Gait Pattern/deviations: Step-to pattern;Decreased stride length Gait velocity: decreased Gait velocity interpretation: 1.31 - 2.62 ft/sec, indicative of limited community ambulator General Gait Details: min guard for safety, vc for sequencing to maintain precautions for turning       Balance Overall balance assessment: Needs assistance Sitting-balance support: No upper extremity supported;Feet supported Sitting balance-Leahy Scale: Good     Standing balance support: Bilateral upper extremity supported;During functional activity Standing balance-Leahy Scale: Fair Standing balance comment: static stand without UE support. RW for amb.                            Cognition Arousal/Alertness: Awake/alert;Lethargic Behavior During Therapy: WFL for tasks assessed/performed Overall Cognitive Status: Impaired/Different from baseline Area of Impairment: Problem solving;Memory                     Memory: Decreased short-term memory       Problem Solving: Slow processing General Comments: pt able to remember 2/3 posterior hip precautions and could recall the last with hint         General Comments General comments (skin integrity, edema, etc.): VSS on RA, pt with increased itching, RN aware      Pertinent Vitals/Pain Pain Assessment: Faces Faces Pain Scale: Hurts a little bit Pain Location: L hip Pain Descriptors / Indicators: Discomfort Pain Intervention(s): Monitored during session;Limited activity within patient's tolerance;Repositioned           PT Goals (  current goals can now be found in the care plan section) Acute Rehab PT Goals Patient Stated Goal: home PT Goal Formulation: With patient Time For Goal Achievement: 10/24/20 Potential to Achieve Goals: Good Progress towards PT goals: Progressing toward goals    Frequency    Min 5X/week      PT Plan Current plan remains appropriate       AM-PAC PT "6 Clicks"  Mobility   Outcome Measure  Help needed turning from your back to your side while in a flat bed without using bedrails?: A Little Help needed moving from lying on your back to sitting on the side of a flat bed without using bedrails?: A Little Help needed moving to and from a bed to a chair (including a wheelchair)?: A Little Help needed standing up from a chair using your arms (e.g., wheelchair or bedside chair)?: A Little Help needed to walk in hospital room?: A Little Help needed climbing 3-5 steps with a railing? : A Lot 6 Click Score: 17    End of Session Equipment Utilized During Treatment: Gait belt;Other (comment) (hip abduction brace) Activity Tolerance: Patient tolerated treatment well Patient left: in bed;with call bell/phone within reach;with bed alarm set Nurse Communication: Mobility status;Other (comment) (itching) PT Visit Diagnosis: Difficulty in walking, not elsewhere classified (R26.2)     Time: 6578-4696 PT Time Calculation (min) (ACUTE ONLY): 21 min  Charges:  $Gait Training: 8-22 mins                     Wilmarie Sparlin B. Migdalia Dk PT, DPT Acute Rehabilitation Services Pager [phone] Office [phone]    [place] 10/14/2020, 10:29 AM

## 2020-10-14 NOTE — Progress Notes (Signed)
Patient with abdominal wound. mepilex foam dressing in place. Dressing changed. 2 small circular areas of broken skin noted. Foam dressing replaced. Will monitor. Tessi Eustache, Bettina Gavia RN

## 2020-10-14 NOTE — NC FL2 (Signed)
[place] LEVEL OF CARE SCREENING TOOL     IDENTIFICATION  Patient Name: Mark [name] Birthdate: 03-13-1961 Sex: male Admission Date (Current Location): 10/08/2020  [facility] [address] and [place] Number:  Herbalist and Address:  [facility] [place], [place] [address], [place], [place] [address]      Provider Number: [id number]  Attending Physician Name and Address:  Mercy Riding, MD  Relative Name and Phone Number:       Current Level of Care: Hospital Recommended Level of Care: [place] Prior Approval Number:    Date Approved/Denied:   PASRR Number: [id number]  Discharge Plan: SNF    Current Diagnoses: Patient Active Problem List   Diagnosis Date Noted  . Atrial flutter ([place]) 10/08/2020  . Atrial flutter with rapid ventricular response ([place]) 07/28/2020  . Sepsis ([place]) 07/26/2020  . Closed dislocation of left hip ([place]) 07/26/2020  . CKD (chronic kidney disease), stage IV ([place]) 07/26/2020  . Gout 07/26/2020  . Pneumonia 07/26/2020  . Diverticula of colon 04/29/2020  . Acute renal failure superimposed on chronic kidney disease ([place]) 04/29/2020  . Diverticulitis   . Sinus tachycardia 10/21/2015  . DM (diabetes mellitus) ([place]) 10/09/2011  . Benign essential HTN 10/09/2011  . Post-traumatic osteoarthritis of left hip 10/06/2011    Orientation RESPIRATION BLADDER Height & Weight     Self, Time, Situation, Place  Normal Continent Weight: 250 lb 3.2 oz (113.5 kg) Height:  5\' 9"  (175.3 cm)  BEHAVIORAL SYMPTOMS/MOOD NEUROLOGICAL BOWEL NUTRITION STATUS      Continent Diet (please see discharge summary)  AMBULATORY STATUS COMMUNICATION OF NEEDS Skin   Limited Assist Verbally  (excoriated RT/FT thigh Medial)                       Personal Care Assistance Level of Assistance  Bathing, Feeding, Dressing Bathing Assistance: Limited assistance Feeding assistance: Independent Dressing Assistance: Limited assistance      Functional Limitations Info  Sight, Hearing, Speech Sight Info: Adequate Hearing Info: Adequate Speech Info: Adequate    SPECIAL CARE FACTORS FREQUENCY  PT (By licensed PT), OT (By licensed OT)     PT Frequency: 5x per week OT Frequency: 5x per week            Contractures Contractures Info: Not present    Additional Factors Info  Code Status, Allergies Code Status Info: Full Allergies Info: Penicillins           Current Medications (10/14/2020):  This is the current hospital active medication list Current Facility-Administered Medications  Medication Dose Route Frequency Provider Last Rate Last Admin  . 0.9 %  sodium chloride infusion (Manually program via Guardrails IV Fluids)   Intravenous Once Kathie Dike, MD      . acetaminophen (TYLENOL) tablet 650 mg  650 mg Oral Q6H PRN Rise Patience, MD       Or  . acetaminophen (TYLENOL) suppository 650 mg  650 mg Rectal Q6H PRN Rise Patience, MD      . acidophilus (RISAQUAD) capsule 1 capsule  1 capsule Oral Daily Kathie Dike, MD   1 capsule at 10/14/20 0800  . allopurinol (ZYLOPRIM) tablet 50 mg  50 mg Oral Q M,W,F Rise Patience, MD   50 mg at 10/13/20 0809  . apixaban (ELIQUIS) tablet 5 mg  5 mg Oral BID Pham, Minh Q, RPH-CPP   5 mg at 10/14/20 0756  . ferrous sulfate tablet 325  mg  325 mg Oral Q breakfast Rise Patience, MD   325 mg at 10/14/20 0756  . furosemide (LASIX) tablet 40 mg  40 mg Oral BID Kathie Dike, MD   40 mg at 10/14/20 0756  . [START ON 10/15/2020] metoprolol succinate (TOPROL-XL) 24 hr tablet 100 mg  100 mg Oral Daily Skeet Latch, MD      . polyethylene glycol (MIRALAX / GLYCOLAX) packet 17 g  17 g Oral BID PRN Gonfa, Taye T, MD      . senna-docusate (Senokot-S) tablet 1 tablet  1 tablet Oral BID PRN Wendee Beavers T, MD      . sodium bicarbonate tablet 650 mg  650 mg Oral TID Rise Patience, MD   650 mg at 10/14/20 0800  . vitamin B-12 (CYANOCOBALAMIN) tablet  1,000 mcg  1,000 mcg Oral Daily Rise Patience, MD   1,000 mcg at 10/14/20 0800     Discharge Medications: Please see discharge summary for a list of discharge medications.  Relevant Imaging Results:  Relevant Lab Results:   Additional Information SSN  [id number]  Vinie Sill, LCSWA

## 2020-10-14 NOTE — Progress Notes (Signed)
PROGRESS NOTE  Mark [name] SJG:[id number] DOB: 29-Jul-1961   PCP: Delrae Rend, MD  Patient is from: Home.  Lives alone.  DOA: 10/08/2020 LOS: 4  Chief complaints: Left hip pain  Brief Narrative / Interim history: 59 year old male with history of atrial flutter on Eliquis, CKD-5, DM-2, diastolic CHF, anemia of renal disease and recurrent left hip dislocation presenting with left hip pain.  Imaging revealed left hip dislocation that was reduced by orthopedic surgery.  He was also in atrial flutter with RVR and started on IV Cardizem.  Cardiology consulted.  Eventually, heart rate improved.  He was transitioned to p.o. metoprolol.  Cardiology adjusting metoprolol dose for better rate control which is limited by soft blood pressures.  Plan for outpatient cardioversion next months.    Therapy recommended SNF.  Could be discharged once heart rate fairly controlled and cleared by cardiology.  Subjective: Seen and examined earlier this morning.  No major events overnight of this morning.  Could not sleep well due to discomfort with brace.  Denies chest pain, dyspnea, palpitation, GI or UTI symptoms.  Objective: Vitals:   10/14/20 0434 10/14/20 0742 10/14/20 1155 10/14/20 1156  BP: 117/86 107/78  101/77  Pulse: (!) 109 100 (!) 103 83  Resp: 18 14  20   Temp: 98 F (36.7 C) 97.6 F (36.4 C)    TempSrc: Oral Oral    SpO2: 96% 98%  100%  Weight: 113.5 kg     Height:        Intake/Output Summary (Last 24 hours) at 10/14/2020 1212 Last data filed at 10/14/2020 1000 Gross per 24 hour  Intake 240 ml  Output 1625 ml  Net -1385 ml   Filed Weights   10/11/20 0454 10/13/20 0613 10/14/20 0434  Weight: 115.1 kg 112.5 kg 113.5 kg    Examination:  GENERAL: No apparent distress.  Nontoxic. HEENT: MMM.  Vision and hearing grossly intact.  NECK: Supple.  No apparent JVD.  RESP:  No IWOB.  Fair aeration bilaterally. CVS: HR 100-110. Heart sounds normal.  ABD/GI/GU: BS+. Abd soft, NTND.   MSK/EXT:  Moves extremities.  Brace over LLE. SKIN: no apparent skin lesion or wound NEURO: Awake, alert and oriented appropriately.  No apparent focal neuro deficit. PSYCH: Calm. Normal affect.   Procedures:  Closed reduction of left hip  Microbiology summarized: COVID-19 and influenza PCR nonreactive.  Assessment & Plan: Atrial flutter with RVR: RVR improved, 100-110 but not at goal.  BP better today.  TTE with LVEF of 60 to 65%, severe LAE, but no significant valvular disease. -Cardiology managing   -Increase metoprolol XL to 100 mg daily (home dose).  -On Eliquis for anticoagulation.  -Plan for outpatient cardioversion next month -Optimize electrolytes-replacing magnesium.  Recurrent left hip dislocation: s/p closed hip reduction by orthopedic surgery on 12/2. -Ortho recommended hinged abduction brace set to 60 degrees flexion and 15 degrees abduction  -Knee immobilizer if difficulty tolerating brace at night. -Outpatient follow-up with Ortho in 1 week -Therapy recommended SNF given risk for fall and recurrent dislocation.  He lives alone.  CKD-5/azotemia: Renal function at baseline.  Has AV fistula placed several years ago but never been on HD.  Followed by Dr. Posey Pronto.  Not uremic.  No hyperkalemia.  Creatinine is slightly improved. -Continue monitoring  Hypercalcemia/possible primary hyperparathyroidism: Ca 11 and corrects to 12 for hypoalbuminemia.  Seems chronic.  PTH elevated to 214 suggesting primary hyperparathyroidism. -Check urine calcium and vitamin D -He may need bone density scan -Needs outpatient  follow-up with general surgery  Mild hyperphosphatemia: P 5.2 -Recheck in the morning -May need Phos binder and nephrology involvement if no improvement  Anemia of renal disease: Baseline Hgb 8-9>> 7.2>1u> 8.7.  Anemia panel consistent with anemia of chronic disease -Continue monitoring -Continue p.o. ferrous sulfate  Hyperkalemia: Resolved after Lokelma  Lasix.  Metabolic acidosis: Bicarb 22. Likely due to renal failure. Resolved. -Continue oral bicarb replacement  Acute on chronic diastolic congestive heart failure: Diuresed with IV Lasix and transition to p.o.  TTE as above. Had 1.6 L UOP/24 hours. Net -4.5 L. Fairly euvolemic on exam now. -Continue p.o. Lasix 40 mg twice daily -Monitor fluid status and renal functions  Controlled DM-2 with CKD-5: A1c 5.5%. Recent Labs  Lab 10/12/20 1611 10/12/20 2215 10/13/20 0620 10/13/20 1132 10/13/20 1638  GLUCAP 109* 101* 81 88 88  -Discontinued CBG monitoring and SSI insulin  Morbid obesity in the setting of diabetes Body mass index is 36.95 kg/m.  -Encourage lifestyle change to lose weight  Debility/physical deconditioning -Continue PT/OT-eventual SNF.       DVT prophylaxis:   apixaban (ELIQUIS) tablet 5 mg  Code Status: Full code Family Communication: Patient and/or RN. Available if any question.  Status is: Inpatient  Remains inpatient appropriate because:Hemodynamically unstable   Dispo: The patient is from: Home              Anticipated d/c is to: SNF              Anticipated d/c date is: 2 days              Patient currently is not medically stable to d/c.       Consultants:  Cardiology-following. Orthopedic surgery-signed off General surgery over the phone for possible primary hypothyroidism   Sch Meds:  Scheduled Meds: . sodium chloride   Intravenous Once  . acidophilus  1 capsule Oral Daily  . allopurinol  50 mg Oral Q M,W,F  . apixaban  5 mg Oral BID  . ferrous sulfate  325 mg Oral Q breakfast  . furosemide  40 mg Oral BID  . [START ON 10/15/2020] metoprolol succinate  100 mg Oral Daily  . sodium bicarbonate  650 mg Oral TID  . vitamin B-12  1,000 mcg Oral Daily   Continuous Infusions: PRN Meds:.acetaminophen **OR** acetaminophen, polyethylene glycol, senna-docusate  Antimicrobials: Anti-infectives (From admission, onward)   None        I have personally reviewed the following labs and images: CBC: Recent Labs  Lab 10/08/20 1446 10/09/20 0343 10/10/20 0150 10/11/20 0154 10/12/20 0202 10/13/20 0323 10/14/20 0157  WBC 8.7   < > 10.7* 12.0* 11.7* 9.4 8.6  NEUTROABS 7.0  --   --   --   --   --   --   HGB 8.0*   < > 7.2* 8.9* 8.3* 8.5* 8.7*  HCT 26.8*   < > 24.1* 29.1* 27.1* 26.0* 28.2*  MCV 106.8*   < > 106.6* 102.5* 102.3* 98.9 100.7*  PLT 181   < > 123* 168 188 180 177   < > = values in this interval not displayed.   BMP &GFR Recent Labs  Lab 10/10/20 0150 10/10/20 0150 10/11/20 0154 10/12/20 0202 10/13/20 0323 10/13/20 0805 10/14/20 0157  NA 139  --  138 138 138  --  139  K 5.2*  --  4.4 4.4 4.1  --  3.9  CL 109  --  106 104 105  --  104  CO2 15*  --  17* 21* 20*  --  22  GLUCOSE 111*  --  99 102* 92  --  101*  BUN 112*  --  113* 115* 111*  --  102*  CREATININE 4.92*  --  4.94* 4.68* 4.47*  --  4.55*  CALCIUM 11.2*   < > 10.7* 11.1* 11.1* 11.0* 11.0*  MG  --   --   --   --  1.6*  --  1.9  PHOS 8.2*  --   --   --  5.0*  --  5.2*   < > = values in this interval not displayed.   Estimated Creatinine Clearance: 21.2 mL/min (A) (by C-G formula based on SCr of 4.55 mg/dL (H)). Liver & Pancreas: Recent Labs  Lab 10/10/20 0150 10/13/20 0323 10/14/20 0157  ALBUMIN 2.8* 2.9* 2.8*   No results for input(s): LIPASE, AMYLASE in the last 168 hours. No results for input(s): AMMONIA in the last 168 hours. Diabetic: Recent Labs    10/11/20 1430  HGBA1C 5.5   Recent Labs  Lab 10/12/20 1611 10/12/20 2215 10/13/20 0620 10/13/20 1132 10/13/20 1638  GLUCAP 109* 101* 81 88 88   Cardiac Enzymes: Recent Labs  Lab 10/08/20 1446  CKTOTAL 124   No results for input(s): PROBNP in the last 8760 hours. Coagulation Profile: No results for input(s): INR, PROTIME in the last 168 hours. Thyroid Function Tests: No results for input(s): TSH, T4TOTAL, FREET4, T3FREE, THYROIDAB in the last 72  hours. Lipid Profile: No results for input(s): CHOL, HDL, LDLCALC, TRIG, CHOLHDL, LDLDIRECT in the last 72 hours. Anemia Panel: Recent Labs    10/13/20 0323  VITAMINB12 4,049*  FOLATE 10.8  FERRITIN 599*  TIBC 216*  IRON 28*  RETICCTPCT 2.7   Urine analysis:    Component Value Date/Time   COLORURINE YELLOW 07/26/2020 [place] 07/26/2020 1244   LABSPEC 1.012 07/26/2020 1244   PHURINE 5.0 07/26/2020 1244   GLUCOSEU NEGATIVE 07/26/2020 1244   HGBUR MODERATE (A) 07/26/2020 1244   BILIRUBINUR NEGATIVE 07/26/2020 1244   KETONESUR NEGATIVE 07/26/2020 1244   PROTEINUR 100 (A) 07/26/2020 1244   UROBILINOGEN 0.2 09/27/2011 1317   NITRITE NEGATIVE 07/26/2020 1244   LEUKOCYTESUR NEGATIVE 07/26/2020 1244   Sepsis Labs: Invalid input(s): PROCALCITONIN, [place]  Microbiology: Recent Results (from the past 240 hour(s))  Resp Panel by RT-PCR (Flu A&B, Covid) Nasopharyngeal Swab     Status: None   Collection Time: 10/08/20  6:19 PM   Specimen: Nasopharyngeal Swab; Nasopharyngeal(NP) swabs in vial transport medium  Result Value Ref Range Status   SARS Coronavirus 2 by RT PCR NEGATIVE NEGATIVE Final    Comment: (NOTE) SARS-CoV-2 target nucleic acids are NOT DETECTED.  The SARS-CoV-2 RNA is generally detectable in upper respiratory specimens during the acute phase of infection. The lowest concentration of SARS-CoV-2 viral copies this assay can detect is 138 copies/mL. A negative result does not preclude SARS-Cov-2 infection and should not be used as the sole basis for treatment or other patient management decisions. A negative result may occur with  improper specimen collection/handling, submission of specimen other than nasopharyngeal swab, presence of viral mutation(s) within the areas targeted by this assay, and inadequate number of viral copies(<138 copies/mL). A negative result must be combined with clinical observations, patient history, and  epidemiological information. The expected result is Negative.  Fact Sheet for Patients:  EntrepreneurPulse.com.au  Fact Sheet for Healthcare Providers:  IncredibleEmployment.be  This test is no t yet  approved or cleared by the [place] and  has been authorized for detection and/or diagnosis of SARS-CoV-2 by FDA under an Emergency Use Authorization (EUA). This EUA will remain  in effect (meaning this test can be used) for the duration of the COVID-19 declaration under Section 564(b)(1) of the Act, 21 U.S.C.section 360bbb-3(b)(1), unless the authorization is terminated  or revoked sooner.       Influenza A by PCR NEGATIVE NEGATIVE Final   Influenza B by PCR NEGATIVE NEGATIVE Final    Comment: (NOTE) The Xpert Xpress SARS-CoV-2/FLU/RSV plus assay is intended as an aid in the diagnosis of influenza from Nasopharyngeal swab specimens and should not be used as a sole basis for treatment. Nasal washings and aspirates are unacceptable for Xpert Xpress SARS-CoV-2/FLU/RSV testing.  Fact Sheet for Patients: EntrepreneurPulse.com.au  Fact Sheet for Healthcare Providers: IncredibleEmployment.be  This test is not yet approved or cleared by the [place] FDA and has been authorized for detection and/or diagnosis of SARS-CoV-2 by FDA under an Emergency Use Authorization (EUA). This EUA will remain in effect (meaning this test can be used) for the duration of the COVID-19 declaration under Section 564(b)(1) of the Act, 21 U.S.C. section 360bbb-3(b)(1), unless the authorization is terminated or revoked.  Performed at [facility], [place] [address], [place], [place] [address]     Radiology Studies: No results found.     Adelene Polivka T. Winigan  If 7PM-7AM, please contact night-coverage [email or web address] 10/14/2020, 12:12 PM

## 2020-10-15 LAB — RENAL FUNCTION PANEL
Albumin: 2.8 g/dL — ABNORMAL LOW (ref 3.5–5.0)
Anion gap: 13 (ref 5–15)
BUN: 109 mg/dL — ABNORMAL HIGH (ref 8–23)
CO2: 21 mmol/L — ABNORMAL LOW (ref 22–32)
Calcium: 11 mg/dL — ABNORMAL HIGH (ref 8.9–10.3)
Chloride: 103 mmol/L (ref 98–111)
Creatinine, Ser: 4.1 mg/dL — ABNORMAL HIGH (ref 0.61–1.24)
GFR, Estimated: 16 mL/min — ABNORMAL LOW (ref >60)
Glucose, Bld: 90 mg/dL (ref 70–99)
Phosphorus: 5.1 mg/dL — ABNORMAL HIGH (ref 2.5–4.6)
Potassium: 4.1 mmol/L (ref 3.5–5.1)
Sodium: 137 mmol/L (ref 135–145)

## 2020-10-15 LAB — CBC
HCT: 26.4 % — ABNORMAL LOW (ref 39.0–52.0)
Hemoglobin: 8.5 g/dL — ABNORMAL LOW (ref 13.0–17.0)
MCH: 32.2 pg (ref 26.0–34.0)
MCHC: 32.2 g/dL (ref 30.0–36.0)
MCV: 100 fL (ref 80.0–100.0)
Platelets: 190 10*3/uL (ref 150–400)
RBC: 2.64 MIL/uL — ABNORMAL LOW (ref 4.22–5.81)
RDW: 15.9 % — ABNORMAL HIGH (ref 11.5–15.5)
WBC: 9.4 10*3/uL (ref 4.0–10.5)
nRBC: 0 % (ref 0.0–0.2)

## 2020-10-15 LAB — MAGNESIUM: Magnesium: 1.9 mg/dL (ref 1.7–2.4)

## 2020-10-15 MED ORDER — METOPROLOL SUCCINATE ER 100 MG PO TB24
100.0000 mg | ORAL_TABLET | Freq: Every day | ORAL | Status: DC
Start: 2020-10-16 — End: 2020-10-29

## 2020-10-15 MED ORDER — POLYETHYLENE GLYCOL 3350 17 G PO PACK: 17.0000 g | PACK | Freq: Two times a day (BID) | ORAL | 0 refills | Status: AC | PRN

## 2020-10-15 MED ORDER — SENNOSIDES-DOCUSATE SODIUM 8.6-50 MG PO TABS: 1.0000 | ORAL_TABLET | Freq: Two times a day (BID) | ORAL | Status: DC | PRN

## 2020-10-15 NOTE — TOC Progression Note (Signed)
Transition of Care [facility]) - Progression Note    Patient Details  Name: Mark [name] MRN: [id number] Date of Birth: 1960-11-16  Transition of Care [facility] CM/SW [place], [place] Phone Number: 10/15/2020, 9:13 AM  Clinical Narrative:     CSW contacted [facility]/SNF - waiting on response.  Thurmond Butts, MSW, [place] Clinical Social Worker   Expected Discharge Plan: Skilled Nursing Facility Barriers to Discharge: Continued Medical Work up, Ship broker, SNF Pending bed offer  Expected Discharge Plan and Services Expected Discharge Plan: [place] In-house Referral: Clinical Social Work                                             Social Determinants of Health (SDOH) Interventions    Readmission Risk Interventions No flowsheet data found.

## 2020-10-15 NOTE — Progress Notes (Signed)
PROGRESS NOTE  Mark [name] YPP:[id number] DOB: [birth year]   PCP: Delrae Rend, MD  Patient is from: Home.  Lives alone.  DOA: 10/08/2020 LOS: 5  Chief complaints: Left hip pain  Brief Narrative / Interim history: 59 year old male with history of atrial flutter on Eliquis, CKD-5, DM-2, diastolic CHF, anemia of renal disease and recurrent left hip dislocation presenting with left hip pain.  Imaging revealed left hip dislocation that was reduced by orthopedic surgery.  He was also in atrial flutter with RVR and started on IV Cardizem.  Cardiology consulted.  Eventually, transitioned to p.o. metoprolol.  Heart rate improved with titration of his metoprolol.  He is on Eliquis for anticoagulation.  Patient noted to have persistent hypercalcemia.  Work-up concerning for primary hyperparathyroidism.  Vitamin D level 52.  Urine calcium pending.  General surgery consulted, and will arrange outpatient follow-up.   Therapy recommended SNF.  Cleared for discharge by cardiology with a plan for outpatient cardioversion next month.  Subjective: Seen and examined earlier this morning.  No major events overnight of this morning.  No complaints.  Somewhat sleepy but awakes to voice easily.  He is oriented x4.  Objective: Vitals:   10/14/20 1933 10/15/20 0044 10/15/20 0409 10/15/20 0736  BP: 106/84 (!) 137/97 109/81 113/89  Pulse: 99 99 100 100  Resp: 18 18 20 13   Temp: (!) 97.5 F (36.4 C) 98 F (36.7 C) 97.6 F (36.4 C) 98 F (36.7 C)  TempSrc: Oral Oral Oral Oral  SpO2: 100% 98% 100% 100%  Weight:   111.3 kg   Height:        Intake/Output Summary (Last 24 hours) at 10/15/2020 1454 Last data filed at 10/15/2020 0736 Gross per 24 hour  Intake 240 ml  Output 1000 ml  Net -760 ml   Filed Weights   10/13/20 0613 10/14/20 0434 10/15/20 0409  Weight: 112.5 kg 113.5 kg 111.3 kg    Examination:  GENERAL: No apparent distress.  Nontoxic. HEENT: MMM.  Vision and hearing grossly intact.   NECK: Supple.  No apparent JVD.  RESP: On RA.  No IWOB.  Fair aeration bilaterally. CVS: HR in 90s.  Regular rhythm.. Heart sounds normal.  ABD/GI/GU: BS+. Abd soft, NTND.  MSK/EXT:  Moves extremities.  Brace over left hip and left knee. SKIN: no apparent skin lesion or wound NEURO: Awake, alert and oriented appropriately.  No apparent focal neuro deficit. PSYCH: Calm. Normal affect. Procedures:  Closed reduction of left hip  Microbiology summarized: COVID-19 and influenza PCR nonreactive.  Assessment & Plan: Atrial flutter with RVR: RVR improved, 100-110 but not at goal.  BP better today.  TTE with LVEF of 60 to 65%, severe LAE, but no significant valvular disease. -Cardiology managing   -Increased metoprolol XL to 100 mg daily (home dose). HR in the 90s now.  -On Eliquis for anticoagulation.  -Plan for outpatient cardioversion next month -Optimize electrolytes  Recurrent left hip dislocation: s/p closed hip reduction by orthopedic surgery on 12/2. -Ortho recommended hinged abduction brace set to 60 degrees flexion and 15 degrees abduction  -Knee immobilizer if difficulty tolerating brace at night. -Outpatient follow-up with Ortho in 1 week -Therapy recommended SNF given risk for fall and recurrent dislocation.  He lives alone.  CKD-5/azotemia: Renal function at baseline.  Has AV fistula placed several years ago but never been on HD.  Followed by Dr. Posey Pronto.  Not uremic.  No hyperkalemia.  Creatinine improved and better than baseline.  Excellent urine output on  p.o. Lasix. -Continue monitoring -Outpatient follow-up with nephrology  Hypercalcemia/possible primary hyperparathyroidism: Ca 11 and corrects to 12 for hypoalbuminemia.  Seems chronic.  PTH elevated to 214 suggesting primary hyperparathyroidism.  Vitamin D level 52. -24-hour urine collection for urine calcium ongoing. -He may need bone density scan -General surgery to arrange outpatient follow-up.  Mild  hyperphosphatemia: P 5.1 -Recheck in the morning -May need Phos binder and nephrology involvement if no improvement  Anemia of renal disease: Baseline Hgb 8-9>> 7.2>1u> 8.7.  Anemia panel consistent with anemia of chronic disease -Continue monitoring -Continue p.o. ferrous sulfate  Hyperkalemia: Resolved after Lokelma Lasix.  Metabolic acidosis: Bicarb 21. Likely due to renal failure.  Stable. -Continue oral bicarb replacement  Acute on chronic diastolic congestive heart failure: Diuresed with IV Lasix and transition to p.o.  TTE as above.  About 2.0 L UOP/24 hours.  Net -6 L.  Creatinine improving.  Appears euvolemic. -Continue p.o. Lasix 40 mg twice daily -Monitor fluid status and renal functions  Controlled DM-2 with CKD-5: A1c 5.5%. Recent Labs  Lab 10/12/20 1611 10/12/20 2215 10/13/20 0620 10/13/20 1132 10/13/20 1638  GLUCAP 109* 101* 81 88 88  -Discontinued CBG monitoring and SSI insulin  Morbid obesity in the setting of diabetes Body mass index is 36.22 kg/m.  -Encourage lifestyle change to lose weight  Debility/physical deconditioning -Continue PT/OT-eventual SNF.       DVT prophylaxis:   apixaban (ELIQUIS) tablet 5 mg  Code Status: Full code Family Communication: Patient and/or RN. Available if any question.  Status is: Inpatient  Remains inpatient appropriate because:Unsafe d/c plan   Dispo: The patient is from: Home              Anticipated d/c is to: SNF              Anticipated d/c date is: 1 day              Patient currently is medically stable to d/c.       Consultants:  Cardiology-signed off. Orthopedic surgery-signed off General surgery over the phone for possible primary hypothyroidism   Sch Meds:  Scheduled Meds: . sodium chloride   Intravenous Once  . acidophilus  1 capsule Oral Daily  . allopurinol  50 mg Oral Q M,W,F  . apixaban  5 mg Oral BID  . ferrous sulfate  325 mg Oral Q breakfast  . furosemide  40 mg Oral BID  .  hydrocerin  1 application Topical BID  . metoprolol succinate  100 mg Oral Daily  . sodium bicarbonate  650 mg Oral TID  . vitamin B-12  1,000 mcg Oral Daily   Continuous Infusions: PRN Meds:.acetaminophen **OR** acetaminophen, polyethylene glycol, senna-docusate  Antimicrobials: Anti-infectives (From admission, onward)   None       I have personally reviewed the following labs and images: CBC: Recent Labs  Lab 10/11/20 0154 10/12/20 0202 10/13/20 0323 10/14/20 0157 10/15/20 0158  WBC 12.0* 11.7* 9.4 8.6 9.4  HGB 8.9* 8.3* 8.5* 8.7* 8.5*  HCT 29.1* 27.1* 26.0* 28.2* 26.4*  MCV 102.5* 102.3* 98.9 100.7* 100.0  PLT 168 188 180 177 190   BMP &GFR Recent Labs  Lab 10/10/20 0150 10/10/20 0150 10/11/20 0154 10/11/20 0154 10/12/20 0202 10/13/20 0323 10/13/20 0805 10/14/20 0157 10/15/20 0158  NA 139   < > 138  --  138 138  --  139 137  K 5.2*   < > 4.4  --  4.4 4.1  --  3.9 4.1  CL 109   < > 106  --  104 105  --  104 103  CO2 15*   < > 17*  --  21* 20*  --  22 21*  GLUCOSE 111*   < > 99  --  102* 92  --  101* 90  BUN 112*   < > 113*  --  115* 111*  --  102* 109*  CREATININE 4.92*   < > 4.94*  --  4.68* 4.47*  --  4.55* 4.10*  CALCIUM 11.2*   < > 10.7*   < > 11.1* 11.1* 11.0* 11.0* 11.0*  MG  --   --   --   --   --  1.6*  --  1.9 1.9  PHOS 8.2*  --   --   --   --  5.0*  --  5.2* 5.1*   < > = values in this interval not displayed.   Estimated Creatinine Clearance: 23.3 mL/min (A) (by C-G formula based on SCr of 4.1 mg/dL (H)). Liver & Pancreas: Recent Labs  Lab 10/10/20 0150 10/13/20 0323 10/14/20 0157 10/15/20 0158  ALBUMIN 2.8* 2.9* 2.8* 2.8*   No results for input(s): LIPASE, AMYLASE in the last 168 hours. No results for input(s): AMMONIA in the last 168 hours. Diabetic: No results for input(s): HGBA1C in the last 72 hours. Recent Labs  Lab 10/12/20 1611 10/12/20 2215 10/13/20 0620 10/13/20 1132 10/13/20 1638  GLUCAP 109* 101* 81 88 88   Cardiac  Enzymes: No results for input(s): CKTOTAL, CKMB, CKMBINDEX, TROPONINI in the last 168 hours. No results for input(s): PROBNP in the last 8760 hours. Coagulation Profile: No results for input(s): INR, PROTIME in the last 168 hours. Thyroid Function Tests: No results for input(s): TSH, T4TOTAL, FREET4, T3FREE, THYROIDAB in the last 72 hours. Lipid Profile: No results for input(s): CHOL, HDL, LDLCALC, TRIG, CHOLHDL, LDLDIRECT in the last 72 hours. Anemia Panel: Recent Labs    10/13/20 0323  VITAMINB12 4,049*  FOLATE 10.8  FERRITIN 599*  TIBC 216*  IRON 28*  RETICCTPCT 2.7   Urine analysis:    Component Value Date/Time   COLORURINE YELLOW 07/26/2020 [place] 07/26/2020 1244   LABSPEC 1.012 07/26/2020 1244   PHURINE 5.0 07/26/2020 1244   GLUCOSEU NEGATIVE 07/26/2020 1244   HGBUR MODERATE (A) 07/26/2020 1244   BILIRUBINUR NEGATIVE 07/26/2020 1244   KETONESUR NEGATIVE 07/26/2020 1244   PROTEINUR 100 (A) 07/26/2020 1244   UROBILINOGEN 0.2 09/27/2011 1317   NITRITE NEGATIVE 07/26/2020 1244   LEUKOCYTESUR NEGATIVE 07/26/2020 1244   Sepsis Labs: Invalid input(s): PROCALCITONIN, [place]  Microbiology: Recent Results (from the past 240 hour(s))  Resp Panel by RT-PCR (Flu A&B, Covid) Nasopharyngeal Swab     Status: None   Collection Time: 10/08/20  6:19 PM   Specimen: Nasopharyngeal Swab; Nasopharyngeal(NP) swabs in vial transport medium  Result Value Ref Range Status   SARS Coronavirus 2 by RT PCR NEGATIVE NEGATIVE Final    Comment: (NOTE) SARS-CoV-2 target nucleic acids are NOT DETECTED.  The SARS-CoV-2 RNA is generally detectable in upper respiratory specimens during the acute phase of infection. The lowest concentration of SARS-CoV-2 viral copies this assay can detect is 138 copies/mL. A negative result does not preclude SARS-Cov-2 infection and should not be used as the sole basis for treatment or other patient management decisions. A negative result  may occur with  improper specimen collection/handling, submission of specimen other than nasopharyngeal swab, presence of viral mutation(s) within  the areas targeted by this assay, and inadequate number of viral copies(<138 copies/mL). A negative result must be combined with clinical observations, patient history, and epidemiological information. The expected result is Negative.  Fact Sheet for Patients:  EntrepreneurPulse.com.au  Fact Sheet for Healthcare Providers:  IncredibleEmployment.be  This test is no t yet approved or cleared by the [place] FDA and  has been authorized for detection and/or diagnosis of SARS-CoV-2 by FDA under an Emergency Use Authorization (EUA). This EUA will remain  in effect (meaning this test can be used) for the duration of the COVID-19 declaration under Section 564(b)(1) of the Act, 21 U.S.C.section 360bbb-3(b)(1), unless the authorization is terminated  or revoked sooner.       Influenza A by PCR NEGATIVE NEGATIVE Final   Influenza B by PCR NEGATIVE NEGATIVE Final    Comment: (NOTE) The Xpert Xpress SARS-CoV-2/FLU/RSV plus assay is intended as an aid in the diagnosis of influenza from Nasopharyngeal swab specimens and should not be used as a sole basis for treatment. Nasal washings and aspirates are unacceptable for Xpert Xpress SARS-CoV-2/FLU/RSV testing.  Fact Sheet for Patients: EntrepreneurPulse.com.au  Fact Sheet for Healthcare Providers: IncredibleEmployment.be  This test is not yet approved or cleared by the [place] FDA and has been authorized for detection and/or diagnosis of SARS-CoV-2 by FDA under an Emergency Use Authorization (EUA). This EUA will remain in effect (meaning this test can be used) for the duration of the COVID-19 declaration under Section 564(b)(1) of the Act, 21 U.S.C. section 360bbb-3(b)(1), unless the authorization is terminated  or revoked.  Performed at [facility], [place] [address], [place], [place] [address]   SARS Coronavirus 2 by RT PCR (hospital order, performed in [facility] hospital lab) Nasopharyngeal Nasopharyngeal Swab     Status: None   Collection Time: 10/14/20  3:33 PM   Specimen: Nasopharyngeal Swab  Result Value Ref Range Status   SARS Coronavirus 2 NEGATIVE NEGATIVE Final    Comment: (NOTE) SARS-CoV-2 target nucleic acids are NOT DETECTED.  The SARS-CoV-2 RNA is generally detectable in upper and lower respiratory specimens during the acute phase of infection. The lowest concentration of SARS-CoV-2 viral copies this assay can detect is 250 copies / mL. A negative result does not preclude SARS-CoV-2 infection and should not be used as the sole basis for treatment or other patient management decisions.  A negative result may occur with improper specimen collection / handling, submission of specimen other than nasopharyngeal swab, presence of viral mutation(s) within the areas targeted by this assay, and inadequate number of viral copies (<250 copies / mL). A negative result must be combined with clinical observations, patient history, and epidemiological information.  Fact Sheet for Patients:   StrictlyIdeas.no  Fact Sheet for Healthcare Providers: BankingDealers.co.za  This test is not yet approved or  cleared by the [place] FDA and has been authorized for detection and/or diagnosis of SARS-CoV-2 by FDA under an Emergency Use Authorization (EUA).  This EUA will remain in effect (meaning this test can be used) for the duration of the COVID-19 declaration under Section 564(b)(1) of the Act, 21 U.S.C. section 360bbb-3(b)(1), unless the authorization is terminated or revoked sooner.  Performed at [facility], [place] [address] [place], [place] [address]     Radiology Studies: No results found.     Jeffren Dombek T. [place]  If 7PM-7AM, please contact night-coverage [email or web address] 10/15/2020, 2:54 PM

## 2020-10-15 NOTE — Progress Notes (Signed)
Physical Therapy Treatment Patient Details Name: Mark [name] MRN: [id number] DOB: September 11, 1961 Today's Date: 10/15/2020    History of Present Illness Pt is a 59 y.o. male admitted for L hip dislocation. PMH consists of recurrent L hip dislocation, h/o L THA 2014, a flutter, CKD, HTN, osteoporosis, tobacco use, and arthritis.    PT Comments    Pt asleep in bed with hip AB brace in place on entry, reluctantly agreeable to working with therapy. Pt min guard for bed mobility, transfers. Pt requires min A due to slight instability with gait this afternoon. Pt hopeful for discharge to [facility] tomorrow. PT will continue to follow acutely.    Follow Up Recommendations  SNF     Equipment Recommendations  None recommended by PT       Precautions / Restrictions Precautions Precautions: Fall;Posterior Hip Precaution Comments: strict posterior hip precautions Required Braces or Orthoses: Other Brace Other Brace: hinged abduction brace Restrictions Weight Bearing Restrictions: Yes LLE Weight Bearing: Weight bearing as tolerated    Mobility  Bed Mobility Overal bed mobility: Needs Assistance Bed Mobility: Supine to Sit;Sit to Supine     Supine to sit: HOB elevated;Min guard Sit to supine: HOB elevated;Min guard   General bed mobility comments: min guard for coming to EOB and for managaging LE back into bed  Transfers Overall transfer level: Needs assistance Equipment used: Rolling walker (2 wheeled) Transfers: Sit to/from Stand Sit to Stand: Min guard         General transfer comment: increased time and effortto power up and achieve balance  Ambulation/Gait Ambulation/Gait assistance: Min guard Gait Distance (Feet): 250 Feet Assistive device: Rolling walker (2 wheeled) Gait Pattern/deviations: Step-to pattern;Decreased stride length Gait velocity: decreased Gait velocity interpretation: 1.31 - 2.62 ft/sec, indicative of limited community ambulator General Gait Details:  min guard for safety, reminder of precautions with turning        Balance Overall balance assessment: Needs assistance Sitting-balance support: No upper extremity supported;Feet supported Sitting balance-Leahy Scale: Good     Standing balance support: Bilateral upper extremity supported;During functional activity Standing balance-Leahy Scale: Fair Standing balance comment: static stand without UE support. RW for amb.                            Cognition Arousal/Alertness: Awake/alert;Lethargic Behavior During Therapy: WFL for tasks assessed/performed Overall Cognitive Status: Impaired/Different from baseline Area of Impairment: Problem solving;Memory                             Problem Solving: Slow processing General Comments: able to recall 3/3 precautions         General Comments General comments (skin integrity, edema, etc.): VSS on RA      Pertinent Vitals/Pain Faces Pain Scale: Hurts a little bit Pain Location: L hip Pain Descriptors / Indicators: Discomfort           PT Goals (current goals can now be found in the care plan section) Acute Rehab PT Goals Patient Stated Goal: home PT Goal Formulation: With patient Time For Goal Achievement: 10/24/20 Potential to Achieve Goals: Good Progress towards PT goals: Progressing toward goals    Frequency    Min 5X/week      PT Plan Current plan remains appropriate       AM-PAC PT "6 Clicks" Mobility   Outcome Measure  Help needed turning from your back to your side while  in a flat bed without using bedrails?: A Little Help needed moving from lying on your back to sitting on the side of a flat bed without using bedrails?: A Little Help needed moving to and from a bed to a chair (including a wheelchair)?: A Little Help needed standing up from a chair using your arms (e.g., wheelchair or bedside chair)?: A Little Help needed to walk in hospital room?: A Little Help needed climbing  3-5 steps with a railing? : A Lot 6 Click Score: 17    End of Session Equipment Utilized During Treatment: Gait belt;Other (comment) (hip abduction brace) Activity Tolerance: Patient tolerated treatment well Patient left: in bed;with call bell/phone within reach;with bed alarm set Nurse Communication: Mobility status PT Visit Diagnosis: Difficulty in walking, not elsewhere classified (R26.2)     Time: 0045-9977 PT Time Calculation (min) (ACUTE ONLY): 24 min  Charges:  $Gait Training: 8-22 mins $Therapeutic Exercise: 8-22 mins                     Joliene Salvador B. Migdalia Dk PT, DPT Acute Rehabilitation Services Pager [phone] Office [phone]    [place] 10/15/2020, 4:43 PM

## 2020-10-15 NOTE — TOC Progression Note (Signed)
Transition of Care [facility]) - Progression Note    Patient Details  Name: MARKELL SCIASCIA MRN: [id number] Date of Birth: 01-25-1961  Transition of Care [facility]) CM/SW [place], [place] Phone Number: 10/15/2020, 1:11 PM  Clinical Narrative:     [facility] has confirmed bed offer- patient is agreeable to pay co-payment fee. Insurance remains pending.  Thurmond Butts, MSW, [place] Clinical Social Worker   Expected Discharge Plan: Skilled Nursing Facility Barriers to Discharge: Continued Medical Work up, Ship broker, SNF Pending bed offer  Expected Discharge Plan and Services Expected Discharge Plan: [place] In-house Referral: Clinical Social Work                                             Social Determinants of Health (SDOH) Interventions    Readmission Risk Interventions No flowsheet data found.

## 2020-10-15 NOTE — Progress Notes (Signed)
Progress Note  Patient Name: Mark [name] Date of Encounter: 10/15/2020  [facility] Cardiologist: Quay Burow, MD   Subjective   Pt drowsy this morning, but does answer questions. No complaints  Inpatient Medications    Scheduled Meds: . sodium chloride   Intravenous Once  . acidophilus  1 capsule Oral Daily  . allopurinol  50 mg Oral Q M,W,F  . apixaban  5 mg Oral BID  . ferrous sulfate  325 mg Oral Q breakfast  . furosemide  40 mg Oral BID  . hydrocerin  1 application Topical BID  . metoprolol succinate  100 mg Oral Daily  . sodium bicarbonate  650 mg Oral TID  . vitamin B-12  1,000 mcg Oral Daily   Continuous Infusions:  PRN Meds: acetaminophen **OR** acetaminophen, polyethylene glycol, senna-docusate   Vital Signs    Vitals:   10/14/20 1933 10/15/20 0044 10/15/20 0409 10/15/20 0736  BP: 106/84 (!) 137/97 109/81 113/89  Pulse: 99 99 100 100  Resp: 18 18 20 13   Temp: (!) 97.5 F (36.4 C) 98 F (36.7 C) 97.6 F (36.4 C) 98 F (36.7 C)  TempSrc: Oral Oral Oral Oral  SpO2: 100% 98% 100% 100%  Weight:   111.3 kg   Height:        Intake/Output Summary (Last 24 hours) at 10/15/2020 0842 Last data filed at 10/15/2020 0736 Gross per 24 hour  Intake 380 ml  Output 1750 ml  Net -1370 ml   Last 3 Weights 10/15/2020 10/14/2020 10/13/2020  Weight (lbs) 245 lb 4.8 oz 250 lb 3.2 oz 248 lb  Weight (kg) 111.267 kg 113.49 kg 112.492 kg      Telemetry    Atrial flutter with ventricular rate 100s - Personally Reviewed  ECG    No new tracings - Personally Reviewed  Physical Exam   GEN: obese male in no acute distress.   Neck: No JVD Cardiac:irregular rhythm, tachycardic rate, 3/6 systolic murmur Respiratory: Clear to auscultation bilaterally. GI: Soft, nontender, non-distended  MS: No edema; left leg with brace for hip Neuro:  Nonfocal  Psych: Normal affect   Labs    High Sensitivity Troponin:   Recent Labs  Lab 10/08/20 2121 10/08/20 2337   TROPONINIHS 84* 79*      Chemistry Recent Labs  Lab 10/13/20 0323 10/13/20 0805 10/14/20 0157 10/15/20 0158  NA 138  --  139 137  K 4.1  --  3.9 4.1  CL 105  --  104 103  CO2 20*  --  22 21*  GLUCOSE 92  --  101* 90  BUN 111*  --  102* 109*  CREATININE 4.47*  --  4.55* 4.10*  CALCIUM 11.1* 11.0* 11.0* 11.0*  ALBUMIN 2.9*  --  2.8* 2.8*  GFRNONAA 14*  --  14* 16*  ANIONGAP 13  --  13 13     Hematology Recent Labs  Lab 10/13/20 0323 10/14/20 0157 10/15/20 0158  WBC 9.4 8.6 9.4  RBC 2.63*  2.64* 2.80* 2.64*  HGB 8.5* 8.7* 8.5*  HCT 26.0* 28.2* 26.4*  MCV 98.9 100.7* 100.0  MCH 32.3 31.1 32.2  MCHC 32.7 30.9 32.2  RDW 15.7* 15.8* 15.9*  PLT 180 177 190    BNPNo results for input(s): BNP, PROBNP in the last 168 hours.   DDimer No results for input(s): DDIMER in the last 168 hours.   Radiology    No results found.  Cardiac Studies   Echo 10/09/2020 1. No subcostal  views.  2. Left ventricular ejection fraction, by estimation, is 60 to 65%. The  left ventricle has normal function. The left ventricle has no regional  wall motion abnormalities. There is mild left ventricular hypertrophy.  Left ventricular diastolic parameters  are indeterminate.  3. Right ventricular systolic function is normal. The right ventricular  size is normal.  4. Left atrial size was severely dilated.  5. The mitral valve is normal in structure. Mild mitral valve  regurgitation. No evidence of mitral stenosis. Severe mitral annular  calcification.  6. The aortic valve is normal in structure. Aortic valve regurgitation is  trivial. Moderate aortic valve stenosis.   Patient Profile     59 y.o. male with moderate aortic stenosis, CKD V, hypertension, gout, atrial flutter, and recurrent hip dislocations admitted with hip dislocation and atrial flutter with RVR.  Assessment & Plan    Atrial flutter with RVR - first diagnosed 07/2020  - he was placed on eliquis and lopressor  with plans for OP DCCV, but he ran out of both medications after 30 days without refills and was not seen in follow up - he presented back with another hip dislocation and found to be in Afib RVR again - eliquis was restarted and toprol has been titrated to 100 mg daily - This patients CHA2DS2-VASc Score and unadjusted Ischemic Stroke Rate (% per year) is equal to 0.6 % stroke rate/year from a score of 1 (DM) - will need to complete at least 4 weeks of anticoagulation after DCCV, scheduled in Januart 2022 - he is already setup with an appt in the Afib clinic - 12/16 - to help with arrangement for cardioversion   Moderate aortic stenosis - will need to follow this with an echo in 1 year - mean gradient 29 mmHg   CKD stage 5 - sCr 4.10 - improving   DM - A1c 5.5%   Chronic anemia - Hb 8.5 - stable on eliquis      For questions or updates, please contact [place] Please consult [email or web address] for contact info under        Signed, Ledora Bottcher, PA  10/15/2020, 8:42 AM

## 2020-10-16 DIAGNOSIS — R5381 Other malaise: Secondary | ICD-10-CM

## 2020-10-16 LAB — RENAL FUNCTION PANEL
Albumin: 2.8 g/dL — ABNORMAL LOW (ref 3.5–5.0)
Anion gap: 14 (ref 5–15)
BUN: 120 mg/dL — ABNORMAL HIGH (ref 8–23)
CO2: 21 mmol/L — ABNORMAL LOW (ref 22–32)
Calcium: 11.2 mg/dL — ABNORMAL HIGH (ref 8.9–10.3)
Chloride: 102 mmol/L (ref 98–111)
Creatinine, Ser: 4.48 mg/dL — ABNORMAL HIGH (ref 0.61–1.24)
GFR, Estimated: 14 mL/min — ABNORMAL LOW (ref >60)
Glucose, Bld: 91 mg/dL (ref 70–99)
Phosphorus: 5.9 mg/dL — ABNORMAL HIGH (ref 2.5–4.6)
Potassium: 3.9 mmol/L (ref 3.5–5.1)
Sodium: 137 mmol/L (ref 135–145)

## 2020-10-16 LAB — CBC
HCT: 27.4 % — ABNORMAL LOW (ref 39.0–52.0)
Hemoglobin: 8.5 g/dL — ABNORMAL LOW (ref 13.0–17.0)
MCH: 31.1 pg (ref 26.0–34.0)
MCHC: 31 g/dL (ref 30.0–36.0)
MCV: 100.4 fL — ABNORMAL HIGH (ref 80.0–100.0)
Platelets: 181 10*3/uL (ref 150–400)
RBC: 2.73 MIL/uL — ABNORMAL LOW (ref 4.22–5.81)
RDW: 15.7 % — ABNORMAL HIGH (ref 11.5–15.5)
WBC: 9 10*3/uL (ref 4.0–10.5)
nRBC: 0 % (ref 0.0–0.2)

## 2020-10-16 LAB — GLUCOSE, CAPILLARY
Glucose-Capillary: 99 mg/dL (ref 70–99)
Glucose-Capillary: 132 mg/dL — ABNORMAL HIGH (ref 70–99)

## 2020-10-16 LAB — MAGNESIUM: Magnesium: 1.8 mg/dL (ref 1.7–2.4)

## 2020-10-16 MED ORDER — SEVELAMER CARBONATE 800 MG PO TABS: 800.0000 mg | ORAL_TABLET | Freq: Three times a day (TID) | ORAL | Status: DC

## 2020-10-16 NOTE — Discharge Summary (Signed)
Physician Discharge Summary  Mark [name] UVO:[id number] DOB: 05/27/1961 DOA: 10/08/2020  PCP: Delrae Rend, MD  Admit date: 10/08/2020 Discharge date: 10/16/2020  Admitted From: Home Disposition: SNF  Recommendations for Outpatient Follow-up:  1. Follow ups as below. 2. Please obtain CBC/BMP/Mag at follow up 3. Please follow up on the following pending results: Urine calcium   Discharge Condition: Stable CODE STATUS: Full code   Follow-up Information    Mark Munroe, MD. Go on 11/11/2020.   Specialties: Cardiology, Radiology Why: @1 :40pm for hospital follow up  Contact information: [place] [address] [phone]        Sherran Needs, NP Follow up on 10/23/2020.   Specialties: Nurse Practitioner, Cardiology Why: Please arrive 15 minutes early for your 11am post-hospital Afib clinic appointment Contact information: [place] [address] [place] Follow up in 5 week(s).   Why: For primary hyperparathyroidism Contact information: [address] [place] [address]       [place], [place]. Schedule an appointment as soon as possible for a visit in 1 week(s).   Why: With Dr. Glendale Chard information: [place] [address] [phone]        Mark Pear, MD. Schedule an appointment as soon as possible for a visit in 1 week(s).   Specialty: Orthopedic Surgery Contact information: [place] [address] [phone]                Hospital Course: 59 year old male with history of atrial flutter on Eliquis, CKD-5, DM-2, diastolic CHF, anemia of renal disease and recurrent left hip dislocation presenting with left hip pain.  Imaging revealed left hip dislocation that was reduced by orthopedic surgery.  He was also in atrial flutter with RVR and started on IV Cardizem.  Cardiology consulted.   Eventually, transitioned to p.o. metoprolol.  Heart rate improved with titration of his metoprolol.  He is on Eliquis for anticoagulation. Patient was cleared for discharge by cardiology with a plan for outpatient cardioversion next month.  In regards to recurrent hip dislocation, orthopedic surgery recommended hinged abduction brace set to 60 degrees flexion and 15 degrees abduction or knee immobilizer if difficulty tolerating brace at night, and outpatient follow-up in 1 week.  Patient noted to have persistent hypercalcemia.  Work-up concerning for primary hyperparathyroidism. Vitamin D level 52.  Urine calcium pending.  General surgery consulted, and recommended outpatient follow-up after cardioversion.   Therapy recommended SNF.   Outpatient follow-ups as above.  See individual problem list below for more hospital course.  Discharge Diagnoses:  Atrial flutter with RVR: RVR improved, 90-100. TTE with LVEF of 60 to 65%, severe LAE, but no significant valvular disease. -Cardiology managing              -Increased metoprolol XL to 100 mg daily (home dose). HR in the 90s now.             -On Eliquis for anticoagulation.             -Plan for outpatient cardioversion next month -Optimize electrolytes-check BMP and magnesium in 1 week  Recurrent left hip dislocation: s/p closed hip reduction by orthopedic surgery on 12/2. -Ortho recommended hinged abduction brace set to 60 degrees flexion and 15 degrees abduction  -Knee immobilizer if difficulty tolerating brace at night. -Outpatient follow-up with Ortho in 1 week -Therapy recommended  SNF given risk for fall and recurrent dislocation.  He lives alone.  CKD-5/azotemia: Renal function at baseline.  Has AV fistula placed several years ago but never been on HD.  Followed by Dr. Posey Pronto.  Not uremic.  No hyperkalemia.  Creatinine improved and better than baseline.  Excellent urine output on p.o. Lasix. -Continue monitoring -Outpatient follow-up  with nephrology  Hypercalcemia/possible primary hyperparathyroidism: Ca 11 and corrects to 12 for hypoalbuminemia.  Seems chronic.  PTH elevated to 214 suggesting primary hyperparathyroidism.  Vitamin D level 52. -24-hour urine calcium pending -He may need bone density scan -Outpatient follow-up with general surgery  Mild hyperphosphatemia: P 5.1 -Start Phos binder  Anemia of renal disease: Baseline Hgb 8-9>> 7.2>1u> 8.7.  Anemia panel consistent with anemia of chronic disease -Continue p.o. ferrous sulfate -Check CBC at follow-up.  Hyperkalemia: Resolved after Lokelma Lasix.  Metabolic acidosis: Bicarb 21. Likely due to renal failure.  Stable. -Continue oral bicarb replacement  Acute on chronic diastolic congestive heart failure: Diuresed with IV Lasix and transition to p.o.  TTE as above.  About 2.0 L UOP/24 hours.  Net -6 L.  Creatinine improving.  Appears euvolemic. -Continue p.o. Lasix 40 mg twice daily -Monitor fluid status and renal functions  Controlled DM-2 with CKD-5: A1c 5.5%. -Discontinued CBG monitoring and SSI insulin  Morbid obesity in the setting of diabetes Body mass index is 36.22 kg/m.  -Encourage lifestyle change to lose weight  Debility/physical deconditioning -Continue therapy at SNF   Body mass index is 36.51 kg/m.            Discharge Exam: Vitals:   10/16/20 0855 10/16/20 0900  BP: 111/83   Pulse:    Resp: 20 20  Temp: 97.8 F (36.6 C)   SpO2: 100%     GENERAL: No apparent distress.  Nontoxic. HEENT: MMM.  Vision and hearing grossly intact.  NECK: Supple.  No apparent JVD.  RESP: On RA.  No IWOB.  Fair aeration bilaterally. CVS: HR in 90s.  Regular.Marland Kitchen Heart sounds normal.  ABD/GI/GU: Bowel sounds present. Soft. Non tender.  MSK/EXT:  Moves extremities.  LLE in brace. SKIN: no apparent skin lesion or wound NEURO: Awake, alert and oriented appropriately.  No apparent focal neuro deficit. PSYCH: Calm. Normal  affect.  Discharge Instructions  Discharge Instructions    (HEART FAILURE PATIENTS) Call MD:  Anytime you have any of the following symptoms: 1) 3 pound weight gain in 24 hours or 5 pounds in 1 week 2) shortness of breath, with or without a dry hacking cough 3) swelling in the hands, feet or stomach 4) if you have to sleep on extra pillows at night in order to breathe.   Complete by: As directed    Call MD for:  extreme fatigue   Complete by: As directed    Call MD for:  persistant dizziness or light-headedness   Complete by: As directed    Call MD for:  redness, tenderness, or signs of infection (pain, swelling, redness, odor or green/yellow discharge around incision site)   Complete by: As directed    Call MD for:  severe uncontrolled pain   Complete by: As directed    Diet - low sodium heart healthy   Complete by: As directed    Discharge wound care:   Complete by: As directed    Place small piece of Xeroform gauze Kellie Simmering # 294) on upper right thigh wound. Secure with small foam dressing. Change daily.   Discharge wound care:  Complete by: As directed    Place small piece of Xeroform gauze Kellie Simmering # 294) on upper right thigh wound. Secure with small foam dressing. Change daily.   Increase activity slowly   Complete by: As directed    Increase activity slowly   Complete by: As directed      Allergies as of 10/16/2020      Reactions   Penicillins Swelling, Rash, Other (See Comments)   Arms and eyes swell & skin breaks out  Has patient had a PCN reaction causing immediate rash, facial/tongue/throat swelling, SOB or lightheadedness with hypotension: Yes Has patient had a PCN reaction causing severe rash involving mucus membranes or skin necrosis: Yes Has patient had a PCN reaction that required hospitalization: No Has patient had a PCN reaction occurring within the last 10 years: No If all of the above answers are "NO", then may proceed with Cephalosporin use.      Medication  List    STOP taking these medications   cholecalciferol 25 MCG (1000 UNIT) tablet Commonly known as: VITAMIN D   metoprolol tartrate 100 MG tablet Commonly known as: LOPRESSOR     TAKE these medications   allopurinol 100 MG tablet Commonly known as: ZYLOPRIM Take 0.5 tablets (50 mg total) by mouth every Monday, Wednesday, and Friday. What changed: Another medication with the same name was removed. Continue taking this medication, and follow the directions you see here.   apixaban 5 MG Tabs tablet Commonly known as: ELIQUIS Take 1 tablet (5 mg total) by mouth 2 (two) times daily.   ferrous sulfate 325 (65 FE) MG tablet Take 1 tablet (325 mg total) by mouth daily with breakfast.   furosemide 40 MG tablet Commonly known as: LASIX Take 1 tablet (40 mg total) by mouth 2 (two) times daily.   metoprolol succinate 100 MG 24 hr tablet Commonly known as: TOPROL-XL Take 1 tablet (100 mg total) by mouth daily. Take with or immediately following a meal.   multivitamin with minerals Tabs tablet Take 1 tablet by mouth daily after breakfast.   polyethylene glycol 17 g packet Commonly known as: MIRALAX / GLYCOLAX Take 17 g by mouth 2 (two) times daily as needed for mild constipation.   senna-docusate 8.6-50 MG tablet Commonly known as: Senokot-S Take 1 tablet by mouth 2 (two) times daily as needed for moderate constipation.   sevelamer carbonate 800 MG tablet Commonly known as: RENVELA Take 1 tablet (800 mg total) by mouth 3 (three) times daily with meals.   sodium bicarbonate 650 MG tablet Take 1 tablet (650 mg total) by mouth 3 (three) times daily.   vitamin B-12 1000 MCG tablet Commonly known as: CYANOCOBALAMIN Take 1,000 mcg by mouth daily.            Discharge Care Instructions  (From admission, onward)         Start     Ordered   10/16/20 0000  Discharge wound care:       Comments: Place small piece of Xeroform gauze Kellie Simmering # 294) on upper right thigh wound.  Secure with small foam dressing. Change daily.   10/16/20 0800   10/15/20 0000  Discharge wound care:       Comments: Place small piece of Xeroform gauze Kellie Simmering # 294) on upper right thigh wound. Secure with small foam dressing. Change daily.   10/15/20 2214          Consultations:  Cardiology  Orthopedic surgery  Procedures/Studies:  2D Echo on  10/09/2020 1. No subcostal views.  2. Left ventricular ejection fraction, by estimation, is 60 to 65%. The  left ventricle has normal function. The left ventricle has no regional  wall motion abnormalities. There is mild left ventricular hypertrophy.  Left ventricular diastolic parameters  are indeterminate.  3. Right ventricular systolic function is normal. The right ventricular  size is normal.  4. Left atrial size was severely dilated.  5. The mitral valve is normal in structure. Mild mitral valve  regurgitation. No evidence of mitral stenosis. Severe mitral annular  calcification.  6. The aortic valve is normal in structure. Aortic valve regurgitation is  trivial. Moderate aortic valve stenosis.    CT Head Wo Contrast  Result Date: 10/08/2020 CLINICAL DATA:  Fall, moderate to severe. EXAM: CT HEAD WITHOUT CONTRAST TECHNIQUE: Contiguous axial images were obtained from the base of the skull through the vertex without intravenous contrast. COMPARISON:  07/26/2020 head CT. FINDINGS: Brain: No evidence of parenchymal hemorrhage or extra-axial fluid collection. No mass lesion, mass effect, or midline shift. No CT evidence of acute infarction. Nonspecific mild subcortical and periventricular white matter hypodensity, most in keeping with chronic small vessel ischemic change. Generalized cerebral volume loss. No ventriculomegaly. Vascular: No acute abnormality. Skull: No evidence of calvarial fracture. Sinuses/Orbits: The visualized paranasal sinuses are essentially clear. Other:  The mastoid air cells are unopacified. IMPRESSION: 1. No  evidence of acute intracranial abnormality. No evidence of calvarial fracture. 2. Generalized cerebral volume loss and mild chronic small vessel ischemic changes in the cerebral white matter. Electronically Signed   By: Ilona Sorrel M.D.   On: 10/08/2020 15:35   DG CHEST PORT 1 VIEW  Result Date: 10/09/2020 CLINICAL DATA:  Shortness of breath EXAM: PORTABLE CHEST 1 VIEW COMPARISON:  07/27/2020 FINDINGS: Cardiac shadow is enlarged stable. Lungs are well aerated bilaterally. Mild central vascular congestion is noted without interstitial edema. No focal infiltrate is noted. Rotation of the patient to the right accentuates the mediastinal markings. IMPRESSION: Increased vascular congestion without edema greater no other focal abnormality is noted. Electronically Signed   By: Inez Catalina M.D.   On: 10/09/2020 00:36   ECHOCARDIOGRAM COMPLETE  Result Date: 10/10/2020    ECHOCARDIOGRAM REPORT   Patient Name:   Mark [name] Date of Exam: 10/09/2020 Medical Rec #:  [id number]        Height:       69.0 in Accession #:    [id number]       Weight:       249.1 lb Date of Birth:  26-Jul-1961        BSA:          2.268 m Patient Age:    58 years         BP:           113/70 mmHg Patient Gender: M                HR:           74 bpm. Exam Location:  Inpatient Procedure: 2D Echo, Cardiac Doppler, Color Doppler and Intracardiac            Opacification Agent                             MODIFIED REPORT: This report was modified by Kirk Ruths MD on 10/10/2020 due to Change.  Indications:     Atrial fibrillation  History:  Patient has prior history of Echocardiogram examinations, most                  recent 07/27/2020. Signs/Symptoms:Shortness of Breath; Risk                  Factors:Hypertension. CKD.  Sonographer:     Clayton Lefort RDCS (AE) Referring Phys:  [place] Diagnosing Phys: Kirk Ruths MD  Sonographer Comments: Technically challenging study due to limited acoustic windows, Technically  difficult study due to poor echo windows, patient is morbidly obese and no subcostal window. Image acquisition challenging due to respiratory motion. Imaging  windows limited by belt device surrounding abdomen in apical and subcostal areas. IMPRESSIONS  1. No subcostal views.  2. Left ventricular ejection fraction, by estimation, is 60 to 65%. The left ventricle has normal function. The left ventricle has no regional wall motion abnormalities. There is mild left ventricular hypertrophy. Left ventricular diastolic parameters are indeterminate.  3. Right ventricular systolic function is normal. The right ventricular size is normal.  4. Left atrial size was severely dilated.  5. The mitral valve is normal in structure. Mild mitral valve regurgitation. No evidence of mitral stenosis. Severe mitral annular calcification.  6. The aortic valve is normal in structure. Aortic valve regurgitation is trivial. Moderate aortic valve stenosis. FINDINGS  Left Ventricle: Left ventricular ejection fraction, by estimation, is 60 to 65%. The left ventricle has normal function. The left ventricle has no regional wall motion abnormalities. Definity contrast agent was given IV to delineate the left ventricular  endocardial borders. The left ventricular internal cavity size was normal in size. There is mild left ventricular hypertrophy. Left ventricular diastolic parameters are indeterminate. Right Ventricle: The right ventricular size is normal. Right ventricular systolic function is normal. Left Atrium: Left atrial size was severely dilated. Right Atrium: Right atrial size was normal in size. Pericardium: There is no evidence of pericardial effusion. Mitral Valve: The mitral valve is normal in structure. Severe mitral annular calcification. Mild mitral valve regurgitation. No evidence of mitral valve stenosis. Tricuspid Valve: The tricuspid valve is normal in structure. Tricuspid valve regurgitation is trivial. No evidence of tricuspid  stenosis. Aortic Valve: The aortic valve is normal in structure. Aortic valve regurgitation is trivial. Moderate aortic stenosis is present. Aortic valve mean gradient measures 29.2 mmHg. Aortic valve peak gradient measures 53.9 mmHg. Aortic valve area, by VTI measures 1.14 cm. Pulmonic Valve: The pulmonic valve was not well visualized. Pulmonic valve regurgitation is not visualized. No evidence of pulmonic stenosis. Aorta: The aortic root was not well visualized. Venous: The inferior vena cava was not well visualized.  Additional Comments: No subcostal views.  LEFT VENTRICLE PLAX 2D LVIDd:         4.70 cm LVIDs:         3.60 cm LV PW:         1.40 cm LV IVS:        1.30 cm LVOT diam:     2.00 cm LV SV:         90 LV SV Index:   40 LVOT Area:     3.14 cm  RIGHT VENTRICLE RV Basal diam:  3.30 cm RV S prime:     12.80 cm/s TAPSE (M-mode): 1.7 cm LEFT ATRIUM              Index       RIGHT ATRIUM           Index LA  diam:        5.10 cm  2.25 cm/m  RA Area:     31.30 cm LA Vol (A2C):   152.0 ml 67.02 ml/m RA Volume:   113.00 ml 49.82 ml/m LA Vol (A4C):   226.0 ml 99.64 ml/m LA Biplane Vol: 186.0 ml 82.01 ml/m  AORTIC VALVE AV Area (Vmax):    0.98 cm AV Area (Vmean):   1.04 cm AV Area (VTI):     1.14 cm AV Vmax:           367.20 cm/s AV Vmean:          246.200 cm/s AV VTI:            0.786 m AV Peak Grad:      53.9 mmHg AV Mean Grad:      29.2 mmHg LVOT Vmax:         114.00 cm/s LVOT Vmean:        81.560 cm/s LVOT VTI:          0.285 m LVOT/AV VTI ratio: 0.36  AORTA Ao Root diam: 3.50 cm TRICUSPID VALVE TR Peak grad:   21.3 mmHg TR Vmax:        231.00 cm/s  SHUNTS Systemic VTI:  0.29 m Systemic Diam: 2.00 cm Kirk Ruths MD Electronically signed by Kirk Ruths MD Signature Date/Time: 10/09/2020/1:54:43 PM    Final (Updated)    DG Hip Port Unilat W or [place] Pelvis 1 View Left  Result Date: 10/08/2020 CLINICAL DATA:  Status post reduction EXAM: DG HIP (WITH OR WITHOUT PELVIS) 1V PORT LEFT COMPARISON:  Film  from earlier in the same day. FINDINGS: Left hip prosthesis has now been reduced into the acetabular component. No acute fracture is seen. No soft tissue abnormality is noted. IMPRESSION: Status post reduction. Electronically Signed   By: Inez Catalina M.D.   On: 10/08/2020 19:55   DG Hip Port Unilat W or Wo Pelvis 1 View Left  Result Date: 10/08/2020 CLINICAL DATA:  59 year old male status post reduction of left hip dislocation. EXAM: DG HIP (WITH OR WITHOUT PELVIS) 1V PORT LEFT COMPARISON:  Earlier radiograph dated 10/08/2020. FINDINGS: Persistent superior dislocation of the left hip arthroplasty with femoral component superior to the acetabular cup. There is probable impaction of the femoral component on the superior aspect of the acetabular cup. No acute fracture. The bones are osteopenic. IMPRESSION: Persistent superior dislocation of the left hip arthroplasty with possible impaction. Electronically Signed   By: Anner Crete M.D.   On: 10/08/2020 18:32   DG Hip Unilat W or Wo Pelvis 2-3 Views Left  Result Date: 10/08/2020 CLINICAL DATA:  Left hip arthroplasty dislocation. EXAM: DG HIP (WITH OR WITHOUT PELVIS) 2-3V LEFT COMPARISON:  09/11/2020 FINDINGS: The femoral component of the left hip arthroplasty has dislocated superior to the acetabular component. No acute fracture. No evidence prosthesis component loosening. IMPRESSION: Dislocated left total hip arthroplasty. Electronically Signed   By: Lajean Manes M.D.   On: 10/08/2020 15:05       The results of significant diagnostics from this hospitalization (including imaging, microbiology, ancillary and laboratory) are listed below for reference.     Microbiology: Recent Results (from the past 240 hour(s))  Resp Panel by RT-PCR (Flu A&B, Covid) Nasopharyngeal Swab     Status: None   Collection Time: 10/08/20  6:19 PM   Specimen: Nasopharyngeal Swab; Nasopharyngeal(NP) swabs in vial transport medium  Result Value Ref Range Status   SARS  Coronavirus 2 by  RT PCR NEGATIVE NEGATIVE Final    Comment: (NOTE) SARS-CoV-2 target nucleic acids are NOT DETECTED.  The SARS-CoV-2 RNA is generally detectable in upper respiratory specimens during the acute phase of infection. The lowest concentration of SARS-CoV-2 viral copies this assay can detect is 138 copies/mL. A negative result does not preclude SARS-Cov-2 infection and should not be used as the sole basis for treatment or other patient management decisions. A negative result may occur with  improper specimen collection/handling, submission of specimen other than nasopharyngeal swab, presence of viral mutation(s) within the areas targeted by this assay, and inadequate number of viral copies(<138 copies/mL). A negative result must be combined with clinical observations, patient history, and epidemiological information. The expected result is Negative.  Fact Sheet for Patients:  EntrepreneurPulse.com.au  Fact Sheet for Healthcare Providers:  IncredibleEmployment.be  This test is no t yet approved or cleared by the [place] FDA and  has been authorized for detection and/or diagnosis of SARS-CoV-2 by FDA under an Emergency Use Authorization (EUA). This EUA will remain  in effect (meaning this test can be used) for the duration of the COVID-19 declaration under Section 564(b)(1) of the Act, 21 U.S.C.section 360bbb-3(b)(1), unless the authorization is terminated  or revoked sooner.       Influenza A by PCR NEGATIVE NEGATIVE Final   Influenza B by PCR NEGATIVE NEGATIVE Final    Comment: (NOTE) The Xpert Xpress SARS-CoV-2/FLU/RSV plus assay is intended as an aid in the diagnosis of influenza from Nasopharyngeal swab specimens and should not be used as a sole basis for treatment. Nasal washings and aspirates are unacceptable for Xpert Xpress SARS-CoV-2/FLU/RSV testing.  Fact Sheet for  Patients: EntrepreneurPulse.com.au  Fact Sheet for Healthcare Providers: IncredibleEmployment.be  This test is not yet approved or cleared by the [place] FDA and has been authorized for detection and/or diagnosis of SARS-CoV-2 by FDA under an Emergency Use Authorization (EUA). This EUA will remain in effect (meaning this test can be used) for the duration of the COVID-19 declaration under Section 564(b)(1) of the Act, 21 U.S.C. section 360bbb-3(b)(1), unless the authorization is terminated or revoked.  Performed at [facility], [place] [address] [place], [place] [address]   SARS Coronavirus 2 by RT PCR (hospital order, performed in [facility] hospital lab) Nasopharyngeal Nasopharyngeal Swab     Status: None   Collection Time: 10/14/20  3:33 PM   Specimen: Nasopharyngeal Swab  Result Value Ref Range Status   SARS Coronavirus 2 NEGATIVE NEGATIVE Final    Comment: (NOTE) SARS-CoV-2 target nucleic acids are NOT DETECTED.  The SARS-CoV-2 RNA is generally detectable in upper and lower respiratory specimens during the acute phase of infection. The lowest concentration of SARS-CoV-2 viral copies this assay can detect is 250 copies / mL. A negative result does not preclude SARS-CoV-2 infection and should not be used as the sole basis for treatment or other patient management decisions.  A negative result may occur with improper specimen collection / handling, submission of specimen other than nasopharyngeal swab, presence of viral mutation(s) within the areas targeted by this assay, and inadequate number of viral copies (<250 copies / mL). A negative result must be combined with clinical observations, patient history, and epidemiological information.  Fact Sheet for Patients:   StrictlyIdeas.no  Fact Sheet for Healthcare Providers: BankingDealers.co.za  This test is not yet approved or   cleared by the [place] FDA and has been authorized for detection and/or diagnosis of SARS-CoV-2 by FDA under an  Emergency Use Authorization (EUA).  This EUA will remain in effect (meaning this test can be used) for the duration of the COVID-19 declaration under Section 564(b)(1) of the Act, 21 U.S.C. section 360bbb-3(b)(1), unless the authorization is terminated or revoked sooner.  Performed at [facility], [place] [address], [place], [place] [address]      Labs: BNP (last 3 results) No results for input(s): BNP in the last 8760 hours. Basic Metabolic Panel: Recent Labs  Lab 10/10/20 0150 10/11/20 0154 10/12/20 0202 10/13/20 0323 10/13/20 0805 10/14/20 0157 10/15/20 0158 10/16/20 0102  NA 139   < > 138 138  --  139 137 137  K 5.2*   < > 4.4 4.1  --  3.9 4.1 3.9  CL 109   < > 104 105  --  104 103 102  CO2 15*   < > 21* 20*  --  22 21* 21*  GLUCOSE 111*   < > 102* 92  --  101* 90 91  BUN 112*   < > 115* 111*  --  102* 109* 120*  CREATININE 4.92*   < > 4.68* 4.47*  --  4.55* 4.10* 4.48*  CALCIUM 11.2*   < > 11.1* 11.1* 11.0* 11.0* 11.0* 11.2*  MG  --   --   --  1.6*  --  1.9 1.9 1.8  PHOS 8.2*  --   --  5.0*  --  5.2* 5.1* 5.9*   < > = values in this interval not displayed.   Liver Function Tests: Recent Labs  Lab 10/10/20 0150 10/13/20 0323 10/14/20 0157 10/15/20 0158 10/16/20 0102  ALBUMIN 2.8* 2.9* 2.8* 2.8* 2.8*   No results for input(s): LIPASE, AMYLASE in the last 168 hours. No results for input(s): AMMONIA in the last 168 hours. CBC: Recent Labs  Lab 10/12/20 0202 10/13/20 0323 10/14/20 0157 10/15/20 0158 10/16/20 0102  WBC 11.7* 9.4 8.6 9.4 9.0  HGB 8.3* 8.5* 8.7* 8.5* 8.5*  HCT 27.1* 26.0* 28.2* 26.4* 27.4*  MCV 102.3* 98.9 100.7* 100.0 100.4*  PLT 188 180 177 190 181   Cardiac Enzymes: No results for input(s): CKTOTAL, CKMB, CKMBINDEX, TROPONINI in the last 168 hours. BNP: Invalid input(s): POCBNP CBG: Recent Labs  Lab  10/12/20 1611 10/12/20 2215 10/13/20 0620 10/13/20 1132 10/13/20 1638  GLUCAP 109* 101* 81 88 88   D-Dimer No results for input(s): DDIMER in the last 72 hours. Hgb A1c No results for input(s): HGBA1C in the last 72 hours. Lipid Profile No results for input(s): CHOL, HDL, LDLCALC, TRIG, CHOLHDL, LDLDIRECT in the last 72 hours. Thyroid function studies No results for input(s): TSH, T4TOTAL, T3FREE, THYROIDAB in the last 72 hours.  Invalid input(s): FREET3 Anemia work up No results for input(s): VITAMINB12, FOLATE, FERRITIN, TIBC, IRON, RETICCTPCT in the last 72 hours. Urinalysis    Component Value Date/Time   COLORURINE YELLOW 07/26/2020 [place] 07/26/2020 1244   LABSPEC 1.012 07/26/2020 1244   PHURINE 5.0 07/26/2020 1244   GLUCOSEU NEGATIVE 07/26/2020 1244   HGBUR MODERATE (A) 07/26/2020 [place] 07/26/2020 [place] 07/26/2020 1244   PROTEINUR 100 (A) 07/26/2020 1244   UROBILINOGEN 0.2 09/27/2011 1317   NITRITE NEGATIVE 07/26/2020 1244   LEUKOCYTESUR NEGATIVE 07/26/2020 1244   Sepsis Labs Invalid input(s): PROCALCITONIN,  WBC,  LACTICIDVEN   Time coordinating discharge: 45 minutes  SIGNED:  Mercy Riding, MD  [facility] 10/16/2020, 11:10 AM  If 7PM-7AM,  please contact night-coverage [email or web address]

## 2020-10-16 NOTE — Progress Notes (Signed)
Physical Therapy Treatment Patient Details Name: Mark [name] MRN: [id number] DOB: Jun 16, 1961 Today's Date: 10/16/2020    History of Present Illness Pt is a 59 y.o. male admitted for L hip dislocation. PMH consists of recurrent L hip dislocation, h/o L THA 2014, a flutter, CKD, HTN, osteoporosis, tobacco use, and arthritis.    PT Comments    Pt continues to improve this session.  Focused on education on maintaining hip precautions and corrections to gt deviations.  Pt to d/c to snf this pm for further rehab before returning home.    Follow Up Recommendations  SNF     Equipment Recommendations  None recommended by PT    Recommendations for Other Services       Precautions / Restrictions Precautions Precautions: Fall;Posterior Hip Precaution Comments: strict posterior hip precautions Required Braces or Orthoses: Other Brace Other Brace: hinged abduction brace Restrictions Weight Bearing Restrictions: Yes LLE Weight Bearing: Weight bearing as tolerated    Mobility  Bed Mobility               General bed mobility comments: Pt seated in recliner with hip abduction brace donned  Transfers Overall transfer level: Needs assistance Equipment used: Rolling walker (2 wheeled) Transfers: Sit to/from Stand Sit to Stand: Supervision         General transfer comment: increased time and effort to power up and achieve balance  Ambulation/Gait Ambulation/Gait assistance: Min assist Gait Distance (Feet): 150 Feet Assistive device: Rolling walker (2 wheeled) Gait Pattern/deviations: Step-to pattern;Decreased stride length;Trunk flexed Gait velocity: decreased   General Gait Details: min A for steadying with slightly unsteady gait, reminders for precautions with turns, cues for RW safety and upright posture.   Stairs             Wheelchair Mobility    Modified Rankin (Stroke Patients Only)       Balance Overall balance assessment: Needs  assistance Sitting-balance support: No upper extremity supported;Feet supported Sitting balance-Leahy Scale: Good     Standing balance support: Bilateral upper extremity supported;During functional activity Standing balance-Leahy Scale: Fair Standing balance comment: static stand without UE support. RW for amb.                            Cognition Arousal/Alertness: Awake/alert Behavior During Therapy: WFL for tasks assessed/performed Overall Cognitive Status: Within Functional Limits for tasks assessed                                 General Comments: able to recall 3/3 precautions with cues.      Exercises General Exercises - Lower Extremity Ankle Circles/Pumps: AROM;Both;10 reps;Supine Quad Sets: AROM;Both;10 reps;Supine Long Arc Quad: AROM;Both;10 reps;Seated    General Comments        Pertinent Vitals/Pain Pain Assessment: Faces Faces Pain Scale: Hurts a little bit Pain Location: L hip Pain Descriptors / Indicators: Discomfort Pain Intervention(s): Monitored during session    Home Living                      Prior Function            PT Goals (current goals can now be found in the care plan section) Acute Rehab PT Goals Patient Stated Goal: home Potential to Achieve Goals: Good Progress towards PT goals: Progressing toward goals    Frequency    Min 5X/week  PT Plan Current plan remains appropriate    Co-evaluation              AM-PAC PT "6 Clicks" Mobility   Outcome Measure  Help needed turning from your back to your side while in a flat bed without using bedrails?: A Little Help needed moving from lying on your back to sitting on the side of a flat bed without using bedrails?: A Little Help needed moving to and from a bed to a chair (including a wheelchair)?: A Little Help needed standing up from a chair using your arms (e.g., wheelchair or bedside chair)?: A Little Help needed to walk in hospital  room?: A Little Help needed climbing 3-5 steps with a railing? : A Little 6 Click Score: 18    End of Session Equipment Utilized During Treatment: Gait belt (hip abduction brace.) Activity Tolerance: Patient tolerated treatment well Patient left: with call bell/phone within reach;in chair Nurse Communication: Mobility status PT Visit Diagnosis: Difficulty in walking, not elsewhere classified (R26.2)     Time: 7793-9030 PT Time Calculation (min) (ACUTE ONLY): 20 min  Charges:  $Gait Training: 8-22 mins                     Erasmo Leventhal , PTA Acute Rehabilitation Services Pager [phone] Office [phone]     Noelle Sease Eli Hose 10/16/2020, 2:42 PM

## 2020-10-16 NOTE — Plan of Care (Signed)
Pt. Discharging to SNF for continued rehab. Pt. Is currently complaint wearing ortho brace on left hip/leg. Patient is min assist with ADLs. No complaints of pain. Vital signs have been stable within normal limits. Patient verbalizes understanding of importance of wearing orthopedic brace as well as complaince taking new medications for diagnosed atrial fibrillation.

## 2020-10-16 NOTE — TOC Transition Note (Signed)
Transition of Care [facility]) - CM/SW Discharge Note *[facility] *Room 224    Patient Details  Name: DEAVIN FORST MRN: [id number] Date of Birth: 08/07/1961  Transition of Care [facility]) CM/SW Contact:  Sable Feil, LCSW Phone Number: 10/16/2020, 3:39 PM   Clinical Narrative:  Patient medically stable for discharge and going to [facility] for [facility] services. Discharge clinicals transmitted to facility and non-emergency ambulance transport arranged. Call made to brother Sully Dyment [phone]) and left HIPPA compliant voicemail.    Final next level of care: Skilled Nursing Facility Barriers to Discharge: Barriers Resolved   Patient Goals and CMS Choice Patient states their goals for this hospitalization and ongoing recovery are:: Patient agreeable to [facility] CMS Medicare.gov Compare Post Acute Care list provided to:: Other (Comment Required) (not needed, as patient from [facility]) Choice offered to / list presented to : NA  Discharge Placement PASRR number recieved: 10/14/20            Patient chooses bed at: [facility] Patient to be transferred to facility by: Non-emergency ambulance transport Name of family member notified: Brother Machi Whittaker [phone]) left HIPPA compliant VM Patient and family notified of of transfer: 10/16/20  Discharge Plan and Services In-house Referral: Clinical Social Work                                 Social Determinants of Health (SDOH) Interventions  No SDOH interventions requested or needed at discharge   Readmission Risk Interventions No flowsheet data found.

## 2020-10-17 ENCOUNTER — Encounter: Payer: Self-pay | Admitting: Adult Health

## 2020-10-17 ENCOUNTER — Non-Acute Institutional Stay (SKILLED_NURSING_FACILITY): Payer: 59 | Admitting: Adult Health

## 2020-10-17 DIAGNOSIS — I4892 Unspecified atrial flutter: Secondary | ICD-10-CM

## 2020-10-17 DIAGNOSIS — M24452 Recurrent dislocation, left hip: Secondary | ICD-10-CM

## 2020-10-17 DIAGNOSIS — E872 Acidosis, unspecified: Secondary | ICD-10-CM

## 2020-10-17 DIAGNOSIS — N185 Chronic kidney disease, stage 5: Secondary | ICD-10-CM

## 2020-10-17 DIAGNOSIS — R5381 Other malaise: Secondary | ICD-10-CM

## 2020-10-17 DIAGNOSIS — D638 Anemia in other chronic diseases classified elsewhere: Secondary | ICD-10-CM

## 2020-10-17 LAB — CALCIUM, URINE, 24 HOUR
Calcium, 24 hour urine: 29 mg/24 hr — ABNORMAL LOW (ref 0–320)
Calcium, Ur: 2.9 mg/dL
Total Volume: 1000

## 2020-10-17 LAB — PTH-RELATED PEPTIDE: PTH-related peptide: <2 pmol/L

## 2020-10-17 NOTE — Progress Notes (Signed)
Location:  [name] of Service:  SNF (31) Provider:  Durenda Age, DNP, FNP-BC  Patient Care Team: Delrae Rend, MD as PCP - General (Internal Medicine) Lorretta Harp, MD as PCP - Cardiology (Cardiology) Jamal Maes, MD as Consulting Physician (Nephrology)  Extended Emergency Contact Information Primary Emergency Contact: Glennon [place] Phone: [phone] Relation: Brother  Code Status:  FULL CODE  Goals of care: Advanced Directive information Advanced Directives 10/09/2020  Does Patient Have a Medical Advance Directive? No  Does patient want to make changes to medical advance directive? -  Would patient like information on creating a medical advance directive? No - Patient declined  Pre-existing out of facility DNR order (yellow form or pink MOST form) -     Chief Complaint  Patient presents with  . Acute Visit    Patient is seen for hospital followup, status post admission at [facility] 12/1-12/9 for hip dislocation and atrial flutter with RVR    HPI:  Pt is a 59 y.o. male seen today for hospital follow-up.  He was admitted to [facility] on 10/16/2020 post [facility] hospitalization 10/08/2020 to 10/16/20.  He presented to the hospital with left hip pain and imaging revealed left hip dislocation that was reduced by orthopedic surgery.  He was also in atrial flutter with RVR and was started on IV Cardizem.  Cardiology was consulted.  He was then transitioned to oral metoprolol.  His heart rate improved with titration of his metoprolol.  He is on Eliquis for anticoagulation.  Orthopedic surgery recommended hinged abduction brace set to 60 degrees flexion and 15 degrees abduction or knee immobilizer if difficulty tolerating brace at night.  He has a PMH of right knee arthritis, anemia, bleeding ulcer, chronic kidney disease is stage IV, gout, hypertension, osteoporosis and  pneumonia.  He was seen in his room today. He denies being uncomfortable with his  left hip hinge brace.   Past Medical History:  Diagnosis Date  . Anemia    labs today indicate (09/27/11)  . Arthritis    right knee  . Bleeding ulcer    hx of, required 6 units blood  . Blood transfusion    6 units with ulcer repair surgery  . Chronic kidney disease    stage IV, has fistula, never started diaylsis  . Complication of anesthesia    little slow to wake up with last surgery  . Dyspnea    with exertion  . Gout   . Hip dislocation, left ([place]) 10/09/2020  . Hypertension   . Osteoporosis   . Pneumonia    Past Surgical History:  Procedure Laterality Date  . AV FISTULA PLACEMENT Left 09/07/2016   Procedure: ARTERIOVENOUS (AV) FISTULA CREATION LEFT ARM;  Surgeon: Angelia Mould, MD;  Location: [place];  Service: Vascular;  Laterality: Left;  . COLONOSCOPY    . EXTERNAL EAR SURGERY    . HIP CLOSED REDUCTION Left 07/27/2020   Procedure: CLOSED REDUCTION HIP;  Surgeon: Marchia Bond, MD;  Location: [place];  Service: Orthopedics;  Laterality: Left;  . HIP PINNING  11/2010  . JOINT REPLACEMENT    . OTHER SURGICAL HISTORY  ~ 08/1998   repair bleeding ulcer  . SHOULDER ARTHROSCOPY WITH ROTATOR CUFF REPAIR AND SUBACROMIAL DECOMPRESSION Right 05/26/2017   Procedure: SHOULDER ARTHROSCOPY WITH ROTATOR CUFF REPAIR AND SUBACROMIAL DECOMPRESSION;  Surgeon: Tania Ade, MD;  Location: [place];  Service: Orthopedics;  Laterality: Right;  SHOULDER ARTHROSCOPY WITH ROTATOR CUFF REPAIR AND SUBACROMIAL DECOMPRESSION  . TOTAL HIP ARTHROPLASTY  10/06/11   left  . TOTAL HIP ARTHROPLASTY  10/06/2011   Procedure: TOTAL HIP ARTHROPLASTY;  Surgeon: Kerin Salen;  Location: [place];  Service: Orthopedics;  Laterality: Left;    Allergies  Allergen Reactions  . Penicillins Swelling, Rash and Other (See Comments)    Arms and eyes swell & skin breaks out  Has patient had a PCN reaction causing immediate  rash, facial/tongue/throat swelling, SOB or lightheadedness with hypotension: Yes Has patient had a PCN reaction causing severe rash involving mucus membranes or skin necrosis: Yes Has patient had a PCN reaction that required hospitalization: No Has patient had a PCN reaction occurring within the last 10 years: No If all of the above answers are "NO", then may proceed with Cephalosporin use.     Outpatient Encounter Medications as of 10/17/2020  Medication Sig  . allopurinol (ZYLOPRIM) 100 MG tablet Take 0.5 tablets (50 mg total) by mouth every Monday, Wednesday, and Friday. (Patient not taking: Reported on 10/08/2020)  . apixaban (ELIQUIS) 5 MG TABS tablet Take 1 tablet (5 mg total) by mouth 2 (two) times daily. (Patient not taking: Reported on 10/08/2020)  . ferrous sulfate 325 (65 FE) MG tablet Take 1 tablet (325 mg total) by mouth daily with breakfast.  . furosemide (LASIX) 40 MG tablet Take 1 tablet (40 mg total) by mouth 2 (two) times daily.  . metoprolol succinate (TOPROL-XL) 100 MG 24 hr tablet Take 1 tablet (100 mg total) by mouth daily. Take with or immediately following a meal.  . Multiple Vitamin (MULTIVITAMIN WITH MINERALS) TABS tablet Take 1 tablet by mouth daily after breakfast.  . polyethylene glycol (MIRALAX / GLYCOLAX) 17 g packet Take 17 g by mouth 2 (two) times daily as needed for mild constipation.  . senna-docusate (SENOKOT-S) 8.6-50 MG tablet Take 1 tablet by mouth 2 (two) times daily as needed for moderate constipation.  . sevelamer carbonate (RENVELA) 800 MG tablet Take 1 tablet (800 mg total) by mouth 3 (three) times daily with meals.  . sodium bicarbonate 650 MG tablet Take 1 tablet (650 mg total) by mouth 3 (three) times daily.  . vitamin B-12 (CYANOCOBALAMIN) 1000 MCG tablet Take 1,000 mcg by mouth daily.   No facility-administered encounter medications on file as of 10/17/2020.    Review of Systems  GENERAL: No change in appetite, no fatigue, no weight changes,  no fever, chills or weakness MOUTH and THROAT: Denies oral discomfort, gingival pain or bleeding RESPIRATORY: no cough, SOB, DOE, wheezing, hemoptysis CARDIAC: No chest pain, edema or palpitations GI: No abdominal pain, diarrhea, constipation, heart burn, nausea or vomiting GU: Denies dysuria, frequency, hematuria, incontinence, or discharge NEUROLOGICAL: Denies dizziness, syncope, numbness, or headache PSYCHIATRIC: Denies feelings of depression or anxiety. No report of hallucinations, insomnia, paranoia, or agitation   Immunization History  Administered Date(s) Administered  . Influenza,inj,Quad PF,6+ Mos 10/10/2020   Pertinent  Health Maintenance Due  Topic Date Due  . FOOT EXAM  Never done  . OPHTHALMOLOGY EXAM  Never done  . URINE MICROALBUMIN  Never done  . COLONOSCOPY  Never done  . HEMOGLOBIN A1C  04/11/2021  . INFLUENZA VACCINE  Completed    Vitals:   10/17/20 0855  BP: 100/70  Pulse: 84  Resp: 18  Temp: 97.8 F (36.6 C)  TempSrc: Oral  SpO2: 99%  Weight: 254 lb 4.8 oz (115.3 kg)  Height: 5\' 9"  (1.753 m)  Body mass index is 37.55 kg/m.    Physical Exam  GENERAL APPEARANCE: Well nourished. In no acute distress.  Morbid obesity. SKIN:  Skin is warm and dry.  MOUTH and THROAT: Lips are without lesions. Oral mucosa is moist and without lesions. Tongue is normal in shape, size, and color and without lesions RESPIRATORY: Breathing is even & unlabored, BS CTAB CARDIAC: RRR, no murmur,no extra heart sounds, no edema GI: Abdomen soft, normal BS, no masses, no tenderness EXTREMITIES:  Has hinge brace to left hip NEUROLOGICAL: There is no tremor. Speech is clear. Alert and oriented X 3. PSYCHIATRIC:  Affect and behavior are appropriate  Labs reviewed: Recent Labs    10/14/20 0157 10/15/20 0158 10/16/20 0102  NA 139 137 137  K 3.9 4.1 3.9  CL 104 103 102  CO2 22 21* 21*  GLUCOSE 101* 90 91  BUN 102* 109* 120*  CREATININE 4.55* 4.10* 4.48*  CALCIUM 11.0*  11.0* 11.2*  MG 1.9 1.9 1.8  PHOS 5.2* 5.1* 5.9*   Recent Labs    04/29/20 0826 07/26/20 1409 07/28/20 0307 10/10/20 0150 10/14/20 0157 10/15/20 0158 10/16/20 0102  AST 20 45* 37  --   --   --   --   ALT 14 15 17   --   --   --   --   ALKPHOS 61 73 63  --   --   --   --   BILITOT 0.8 1.0 0.6  --   --   --   --   PROT 8.5* 8.7* 7.7  --   --   --   --   ALBUMIN 3.1* 3.2* 2.9*   < > 2.8* 2.8* 2.8*   < > = values in this interval not displayed.   Recent Labs    08/08/20 0000 09/11/20 1618 10/08/20 1446 10/09/20 0343 10/14/20 0157 10/15/20 0158 10/16/20 0102  WBC 6.4 7.5 8.7   < > 8.6 9.4 9.0  NEUTROABS 4 5.6 7.0  --   --   --   --   HGB 8.2* 8.3* 8.0*   < > 8.7* 8.5* 8.5*  HCT 26* 27.3* 26.8*   < > 28.2* 26.4* 27.4*  MCV  --  107.5* 106.8*   < > 100.7* 100.0 100.4*  PLT 177 162 181   < > 177 190 181   < > = values in this interval not displayed.   Lab Results  Component Value Date   TSH 1.804 10/08/2020   Lab Results  Component Value Date   HGBA1C 5.5 10/11/2020   Significant Diagnostic Results in last 30 days:  CT Head Wo Contrast  Result Date: 10/08/2020 CLINICAL DATA:  Fall, moderate to severe. EXAM: CT HEAD WITHOUT CONTRAST TECHNIQUE: Contiguous axial images were obtained from the base of the skull through the vertex without intravenous contrast. COMPARISON:  07/26/2020 head CT. FINDINGS: Brain: No evidence of parenchymal hemorrhage or extra-axial fluid collection. No mass lesion, mass effect, or midline shift. No CT evidence of acute infarction. Nonspecific mild subcortical and periventricular white matter hypodensity, most in keeping with chronic small vessel ischemic change. Generalized cerebral volume loss. No ventriculomegaly. Vascular: No acute abnormality. Skull: No evidence of calvarial fracture. Sinuses/Orbits: The visualized paranasal sinuses are essentially clear. Other:  The mastoid air cells are unopacified. IMPRESSION: 1. No evidence of acute intracranial  abnormality. No evidence of calvarial fracture. 2. Generalized cerebral volume loss and mild chronic small vessel ischemic changes in the cerebral white matter. Electronically  Signed   By: Ilona Sorrel M.D.   On: 10/08/2020 15:35   DG CHEST PORT 1 VIEW  Result Date: 10/09/2020 CLINICAL DATA:  Shortness of breath EXAM: PORTABLE CHEST 1 VIEW COMPARISON:  07/27/2020 FINDINGS: Cardiac shadow is enlarged stable. Lungs are well aerated bilaterally. Mild central vascular congestion is noted without interstitial edema. No focal infiltrate is noted. Rotation of the patient to the right accentuates the mediastinal markings. IMPRESSION: Increased vascular congestion without edema greater no other focal abnormality is noted. Electronically Signed   By: Inez Catalina M.D.   On: 10/09/2020 00:36   ECHOCARDIOGRAM COMPLETE  Result Date: 10/10/2020    ECHOCARDIOGRAM REPORT   Patient Name:   SILVER PARKEY Date of Exam: 10/09/2020 Medical Rec #:  [id number]        Height:       69.0 in Accession #:    [id number]       Weight:       249.1 lb Date of Birth:  01-May-1961        BSA:          2.268 m Patient Age:    68 years         BP:           113/70 mmHg Patient Gender: M                HR:           74 bpm. Exam Location:  Inpatient Procedure: 2D Echo, Cardiac Doppler, Color Doppler and Intracardiac            Opacification Agent                             MODIFIED REPORT: This report was modified by Kirk Ruths MD on 10/10/2020 due to Change.  Indications:     Atrial fibrillation  History:         Patient has prior history of Echocardiogram examinations, most                  recent 07/27/2020. Signs/Symptoms:Shortness of Breath; Risk                  Factors:Hypertension. CKD.  Sonographer:     Clayton Lefort RDCS (AE) Referring Phys:  [place] Diagnosing Phys: Kirk Ruths MD  Sonographer Comments: Technically challenging study due to limited acoustic windows, Technically difficult study due to poor echo  windows, patient is morbidly obese and no subcostal window. Image acquisition challenging due to respiratory motion. Imaging  windows limited by belt device surrounding abdomen in apical and subcostal areas. IMPRESSIONS  1. No subcostal views.  2. Left ventricular ejection fraction, by estimation, is 60 to 65%. The left ventricle has normal function. The left ventricle has no regional wall motion abnormalities. There is mild left ventricular hypertrophy. Left ventricular diastolic parameters are indeterminate.  3. Right ventricular systolic function is normal. The right ventricular size is normal.  4. Left atrial size was severely dilated.  5. The mitral valve is normal in structure. Mild mitral valve regurgitation. No evidence of mitral stenosis. Severe mitral annular calcification.  6. The aortic valve is normal in structure. Aortic valve regurgitation is trivial. Moderate aortic valve stenosis. FINDINGS  Left Ventricle: Left ventricular ejection fraction, by estimation, is 60 to 65%. The left ventricle has normal function. The left ventricle has no regional wall motion abnormalities. Definity contrast agent was  given IV to delineate the left ventricular  endocardial borders. The left ventricular internal cavity size was normal in size. There is mild left ventricular hypertrophy. Left ventricular diastolic parameters are indeterminate. Right Ventricle: The right ventricular size is normal. Right ventricular systolic function is normal. Left Atrium: Left atrial size was severely dilated. Right Atrium: Right atrial size was normal in size. Pericardium: There is no evidence of pericardial effusion. Mitral Valve: The mitral valve is normal in structure. Severe mitral annular calcification. Mild mitral valve regurgitation. No evidence of mitral valve stenosis. Tricuspid Valve: The tricuspid valve is normal in structure. Tricuspid valve regurgitation is trivial. No evidence of tricuspid stenosis. Aortic Valve: The  aortic valve is normal in structure. Aortic valve regurgitation is trivial. Moderate aortic stenosis is present. Aortic valve mean gradient measures 29.2 mmHg. Aortic valve peak gradient measures 53.9 mmHg. Aortic valve area, by VTI measures 1.14 cm. Pulmonic Valve: The pulmonic valve was not well visualized. Pulmonic valve regurgitation is not visualized. No evidence of pulmonic stenosis. Aorta: The aortic root was not well visualized. Venous: The inferior vena cava was not well visualized.  Additional Comments: No subcostal views.  LEFT VENTRICLE PLAX 2D LVIDd:         4.70 cm LVIDs:         3.60 cm LV PW:         1.40 cm LV IVS:        1.30 cm LVOT diam:     2.00 cm LV SV:         90 LV SV Index:   40 LVOT Area:     3.14 cm  RIGHT VENTRICLE RV Basal diam:  3.30 cm RV S prime:     12.80 cm/s TAPSE (M-mode): 1.7 cm LEFT ATRIUM              Index       RIGHT ATRIUM           Index LA diam:        5.10 cm  2.25 cm/m  RA Area:     31.30 cm LA Vol (A2C):   152.0 ml 67.02 ml/m RA Volume:   113.00 ml 49.82 ml/m LA Vol (A4C):   226.0 ml 99.64 ml/m LA Biplane Vol: 186.0 ml 82.01 ml/m  AORTIC VALVE AV Area (Vmax):    0.98 cm AV Area (Vmean):   1.04 cm AV Area (VTI):     1.14 cm AV Vmax:           367.20 cm/s AV Vmean:          246.200 cm/s AV VTI:            0.786 m AV Peak Grad:      53.9 mmHg AV Mean Grad:      29.2 mmHg LVOT Vmax:         114.00 cm/s LVOT Vmean:        81.560 cm/s LVOT VTI:          0.285 m LVOT/AV VTI ratio: 0.36  AORTA Ao Root diam: 3.50 cm TRICUSPID VALVE TR Peak grad:   21.3 mmHg TR Vmax:        231.00 cm/s  SHUNTS Systemic VTI:  0.29 m Systemic Diam: 2.00 cm Kirk Ruths MD Electronically signed by Kirk Ruths MD Signature Date/Time: 10/09/2020/1:54:43 PM    Final (Updated)    DG Hip Independence W or [place] Pelvis 1 View Left  Result Date: 10/08/2020 CLINICAL DATA:  Status post reduction EXAM: DG HIP (WITH OR WITHOUT PELVIS) 1V PORT LEFT COMPARISON:  Film from earlier in the same day.  FINDINGS: Left hip prosthesis has now been reduced into the acetabular component. No acute fracture is seen. No soft tissue abnormality is noted. IMPRESSION: Status post reduction. Electronically Signed   By: Inez Catalina M.D.   On: 10/08/2020 19:55   DG Hip Port Unilat W or Wo Pelvis 1 View Left  Result Date: 10/08/2020 CLINICAL DATA:  59 year old male status post reduction of left hip dislocation. EXAM: DG HIP (WITH OR WITHOUT PELVIS) 1V PORT LEFT COMPARISON:  Earlier radiograph dated 10/08/2020. FINDINGS: Persistent superior dislocation of the left hip arthroplasty with femoral component superior to the acetabular cup. There is probable impaction of the femoral component on the superior aspect of the acetabular cup. No acute fracture. The bones are osteopenic. IMPRESSION: Persistent superior dislocation of the left hip arthroplasty with possible impaction. Electronically Signed   By: Anner Crete M.D.   On: 10/08/2020 18:32   DG Hip Unilat W or Wo Pelvis 2-3 Views Left  Result Date: 10/08/2020 CLINICAL DATA:  Left hip arthroplasty dislocation. EXAM: DG HIP (WITH OR WITHOUT PELVIS) 2-3V LEFT COMPARISON:  09/11/2020 FINDINGS: The femoral component of the left hip arthroplasty has dislocated superior to the acetabular component. No acute fracture. No evidence prosthesis component loosening. IMPRESSION: Dislocated left total hip arthroplasty. Electronically Signed   By: Lajean Manes M.D.   On: 10/08/2020 15:05    Assessment/Plan  1. Atrial flutter with rapid ventricular response (HCC) -   Rate controlled, metoprolol XL was titrated to home dose,100 mg daily for rate control -   Continue Eliquis for anticoagulation -    will follow up with cardiology  2. Recurrent dislocation of left hip -  S/P closed hip reduction by orthopedic surgery on 10/09/2020 -   Orthopedic recommended hinged abduction brace set to 60 degrees flexion and 15 degrees abduction -    Follow-up with orthopedics in 1  week  3. Chronic kidney disease, stage 5 (HCC) Lab Results  Component Value Date   NA 137 10/16/2020   K 3.9 10/16/2020   CO2 21 (L) 10/16/2020   GLUCOSE 91 10/16/2020   BUN 120 (H) 10/16/2020   CREATININE 4.48 (H) 10/16/2020   CALCIUM 11.2 (H) 10/16/2020   GFRNONAA 14 (L) 10/16/2020   GFRAA 15 08/08/2020   -   Has AV fistula placed several years ago but never been on HD. -    will follow up with Dr. Posey Pronto, nephrology  4. Hypercalcemia Lab Results  Component Value Date   CALCIUM 11.2 (H) 10/16/2020   PHOS 5.9 (H) 10/16/2020   -   Thought to be chronic -   PTH elevated at 214 suggesting primary hyperparathyroidism with vitamin D level of 52 - will schedule for bone density -Follow-up with [facility] [place] [facility]  5. Hyperphosphatemia Lab Results  Component Value Date   CALCIUM 11.2 (H) 10/16/2020   PHOS 5.9 (H) 10/16/2020   -  Continue Sevelamer as phos binder  6. Anemia of chronic disease Lab Results  Component Value Date   WBC 9.0 10/16/2020   HGB 8.5 (L) 10/16/2020   HCT 27.4 (L) 10/16/2020   MCV 100.4 (H) 10/16/2020   PLT 181 10/16/2020   -  S/P transfusion of 1 unit PRBC -  Continue ferrous sulfate -  Follow repeat CBC in 1 week  7. Metabolic acidosis -Continue sodium bicarbonate  8. Morbid obesity ([place])  Body mass index is 37.55 kg/m. -  Discussed food choices  9. Physical deconditioning -  For PT and OT, for therapeutic and strengthening exercises -  Fall precautions    Family/ staff Communication: Discussed plan of care with resident and charge nurse.  Labs/tests ordered: Bone density  Goals of care:   Short-term care   Durenda Age, DNP, MSN, FNP-BC [facility] [phone] (Monday-Friday 8:00 a.m. - 5:00 p.m.) [phone] (after hours)

## 2020-10-21 ENCOUNTER — Encounter: Payer: Self-pay | Admitting: Internal Medicine

## 2020-10-21 ENCOUNTER — Non-Acute Institutional Stay (SKILLED_NURSING_FACILITY): Payer: 59 | Admitting: Internal Medicine

## 2020-10-21 DIAGNOSIS — I5033 Acute on chronic diastolic (congestive) heart failure: Secondary | ICD-10-CM | POA: Insufficient documentation

## 2020-10-21 DIAGNOSIS — N184 Chronic kidney disease, stage 4 (severe): Secondary | ICD-10-CM

## 2020-10-21 DIAGNOSIS — S73005D Unspecified dislocation of left hip, subsequent encounter: Secondary | ICD-10-CM | POA: Diagnosis not present

## 2020-10-21 DIAGNOSIS — I4892 Unspecified atrial flutter: Secondary | ICD-10-CM

## 2020-10-21 DIAGNOSIS — E1122 Type 2 diabetes mellitus with diabetic chronic kidney disease: Secondary | ICD-10-CM

## 2020-10-21 NOTE — Assessment & Plan Note (Addendum)
A1c is current and 5.5%.  This may represent some discordance due to his CKD.  Insulin discontinued during his hospitalization. Copy of H&P to Dr Buddy Duty

## 2020-10-21 NOTE — Assessment & Plan Note (Signed)
PT/OT at SNF.  Orthopedic follow-up approximately 1 week post discharge.

## 2020-10-21 NOTE — Assessment & Plan Note (Signed)
Rhythm irregular but rate controlled

## 2020-10-21 NOTE — Assessment & Plan Note (Signed)
BMP 1 week post discharge. Dr. Posey Pronto will continue to follow him as scheduled.

## 2020-10-21 NOTE — Progress Notes (Signed)
NURSING HOME LOCATION:  [facility] ROOM NUMBER:  [id number]  CODE STATUS:  FULL CODE  PCP:  Delrae Rend, MD  [address] [place] [address]  This is a comprehensive admission note to [facility]: [facility] Rhonda W performed on this date less than 30 days from date of admission. Included are preadmission medical/surgical history; reconciled medication list; family history; social history and comprehensive review of systems.  Corrections and additions to the records were documented. Comprehensive physical exam was also performed. Additionally a clinical summary was entered for each active diagnosis pertinent to this admission in the Problem List to enhance continuity of care.  HPI: Patient was hospitalized 12/1-12/07/2020 admitted from home with left hip pain in the context of recurrent left hip dislocation.  Imaging documented hip dislocation which was reduced by Dr. Mayer Camel.   He was found to be in atrial flutter with RVR and IV Cardizem was initiated and Cardiology consulted.  Subsequently he was able to be transitioned to oral metoprolol as the heart rate improved with titration of the beta-blocker.  He remained on Eliquis for anticoagulation.    He received IV Lasix for acute on chronic diastolic congestive heart failure.  This altered an output of approximately 2 L over 24 hours with a net loss of 6 L during hospitalization.  He was then transitioned to Lasix 40 mg twice daily. Orthopedics recommended hinged abduction brace set to 60 degrees flexion and 15 degrees abduction or knee immobilizer were he unable to tolerate the brace at night.  Outpatient follow-up was to be approximately 1 week post discharge. Incidental finding was persistent hypercalcemia with clinical concern for primary hyperparathyroidism.  Vitamin D level was 52.  General surgery will follow him after cardioversion. He has CKD 5/azotemia; renal function was at baseline.  AV fistula had been placed several years  ago but he has never been on hemodialysis.  Dr. Posey Pronto of [facility] follows him.  Creatinine improved and he exhibited no hyperkalemia.  Phosphate binder was initiated because of elevated phosphate at 5.1.  His CKD is associated with chronic anemia with baseline hemoglobin 8-9.  He received 1 unit of packed cells when the hemoglobin dropped to 7.2.  Oral ferrous sulfate was continued.  Oral bicarbonate replacement was continued because of metabolic acidosis most likely due to the CKD. Patient was cleared for discharge by Cardiology with planned outpatient cardioversion approximately 1 month post discharge. PT recommended SNF placement for rehab because of the recurrent dislocation and high risk for fall. BMP and magnesium were to be checked 1 week following discharge.  Past medical and surgical history: Includes type 2 diabetes with nephrologic complications, morbid obesity, history of gout, history of B12 deficiency, osteoporosis, essential hypertension, and history of bleeding ulcer. Surgeries and procedures include colonoscopy, shoulder arthroscopy with rotator cuff repair, and TKA.  Social history: He drinks occasionally.  He states he has not smoked at all for approximately 6 weeks.  Prior to that he states that he smoked "off and on".    Family history: Reviewed.   Review of systems: He is articulate and provides an excellent history.  At this time he states he has no pain in his hip even ambulating with the brace.  He states that the brace is successful in keeping the leg from turning in.  Initially he had some difficulty as he felt it compressed his chest and affected breathing but not now.  He describes pruritus over the posterior thorax and chest due to  dry skin.  He also has initial dysuria with urination and nocturia.  Constitutional: No fever, significant weight change, fatigue  Eyes: No redness, discharge, pain, vision change ENT/mouth: No nasal congestion, purulent discharge,  earache, change in hearing, sore throat  Cardiovascular: No chest pain, palpitations, paroxysmal nocturnal dyspnea, claudication, edema  Respiratory: No cough, sputum production, hemoptysis, DOE, significant snoring, apnea Gastrointestinal: No heartburn, dysphagia, abdominal pain, nausea /vomiting, rectal bleeding, melena, change in bowels Genitourinary: No  hematuria, pyuria, incontinence Musculoskeletal: No joint stiffness, joint swelling, weakness, pain Dermatologic: No rash Neurologic: No dizziness, headache, syncope, seizures, numbness, tingling Psychiatric: No significant anxiety, depression, insomnia, anorexia Endocrine: No change in hair/skin/nails, excessive thirst, excessive hunger, excessive urination  Hematologic/lymphatic: No significant bruising, lymphadenopathy, abnormal bleeding Allergy/immunology: No itchy/watery eyes, significant sneezing, urticaria, angioedema  Physical exam:  Pertinent or positive findings: He is morbidly obese.  Hair is full and disheveled.  He has a mustache and beard.  Dental hygiene is good.  Heart rhythm is irregular; he has a grade 1.5 systolic murmur at the base.  There is slight bronchovesicular breath sounds over the posterior chest.  Pedal pulses are decreased.  There is no significant edema.  General appearance: no acute distress, increased work of breathing is present.   Lymphatic: No lymphadenopathy about the head, neck, axilla. Eyes: No conjunctival inflammation or lid edema is present. There is no scleral icterus. Ears:  External ear exam shows no significant lesions or deformities.   Nose:  External nasal examination shows no deformity or inflammation. Nasal mucosa are pink and moist without lesions, exudates Oral exam: Lips and gums are healthy appearing.There is no oropharyngeal erythema or exudate. Neck:  No thyromegaly, masses, tenderness noted.    Heart:  Normal rate and regular rhythm. S1 and S2 normal without gallop, click, rub.   Lungs:  without wheezes, rhonchi, rales, rubs. Abdomen: Bowel sounds are normal.  Abdomen is soft and nontender with no organomegaly, masses. GU: Deferred  Extremities:  No cyanosis, clubbing, edema. Neurologic exam:Balance, Rhomberg, finger to nose testing could not be completed due to clinical state Skin: Warm & dry w/o tenting. No significant lesions or rash.  See clinical summary under each active problem in the Problem List with associated updated therapeutic plan

## 2020-10-21 NOTE — Patient Instructions (Signed)
See assessment and plan under each diagnosis in the problem list and acutely for this visit 

## 2020-10-23 ENCOUNTER — Ambulatory Visit (HOSPITAL_COMMUNITY): Payer: 59 | Admitting: Nurse Practitioner

## 2020-10-23 ENCOUNTER — Encounter (HOSPITAL_COMMUNITY): Payer: Self-pay

## 2020-10-27 ENCOUNTER — Non-Acute Institutional Stay (SKILLED_NURSING_FACILITY): Payer: 59 | Admitting: Adult Health

## 2020-10-27 ENCOUNTER — Encounter: Payer: Self-pay | Admitting: Adult Health

## 2020-10-27 DIAGNOSIS — D638 Anemia in other chronic diseases classified elsewhere: Secondary | ICD-10-CM

## 2020-10-27 DIAGNOSIS — L0292 Furuncle, unspecified: Secondary | ICD-10-CM

## 2020-10-27 NOTE — Progress Notes (Signed)
Location:  [facility] Room Number: 315-V Place of Service:  SNF (31) Provider:  Durenda Age, DNP, FNP-BC  Patient Care Team: Delrae Rend, MD as PCP - General (Internal Medicine) Lorretta Harp, MD as PCP - Cardiology (Cardiology) Jamal Maes, MD as Consulting Physician (Nephrology)  Extended Emergency Contact Information Primary Emergency Contact: Glennon Hamilton [place] of [place] Phone: [phone] Relation: Brother  Code Status:  FULL CODE  Goals of care: Advanced Directive information Advanced Directives 10/09/2020  Does Patient Have a Medical Advance Directive? No  Does patient want to make changes to medical advance directive? -  Would patient like information on creating a medical advance directive? No - Patient declined  Pre-existing out of facility DNR order (yellow form or pink MOST form) -     Chief Complaint  Patient presents with  . Acute Visit    Patient is seen for a boil on his scrotum.     HPI:  Mark [name] is a 59 y.o. male seen today for a boil on the left side of scrotum.  He is a short-term care resident of [facility].  He has a PMH of right knee arthritis, anemia, bleeding ulcer, chronic kidney disease is stage IV, gout, hypertension, osteoporosis and pneumonia. He was seen in the room today. Noted a hard nodule on the left side of his scrotum with serous drainage. He denies pain to site. No reported fever, body malaise nor chills. Latest hgb 8.5, 10/16/20. He is currently taking FeSO4 325 mg. He had blood transfusion of 1 unit PRBC on 12/03 due to hbg down to 7.2.   Past Medical History:  Diagnosis Date  . Anemia    labs today indicate (09/27/11)  . Arthritis    right knee  . Bleeding ulcer    hx of, required 6 units blood  . Blood transfusion    6 units with ulcer repair surgery  . Chronic kidney disease    stage IV, has fistula, never started diaylsis  . Complication of  anesthesia    little slow to wake up with last surgery  . Dyspnea    with exertion  . Gout   . Hip dislocation, left ([facility]) 10/09/2020  . Hypertension   . Osteoporosis   . Pneumonia    Past Surgical History:  Procedure Laterality Date  . AV FISTULA PLACEMENT Left 09/07/2016   Procedure: ARTERIOVENOUS (AV) FISTULA CREATION LEFT ARM;  Surgeon: Angelia Mould, MD;  Location: [facility];  Service: Vascular;  Laterality: Left;  . COLONOSCOPY    . EXTERNAL EAR SURGERY    . HIP CLOSED REDUCTION Left 07/27/2020   Procedure: CLOSED REDUCTION HIP;  Surgeon: Marchia Bond, MD;  Location: [facility];  Service: Orthopedics;  Laterality: Left;  . HIP PINNING  11/2010  . JOINT REPLACEMENT    . OTHER SURGICAL HISTORY  ~ 08/1998   repair bleeding ulcer  . SHOULDER ARTHROSCOPY WITH ROTATOR CUFF REPAIR AND SUBACROMIAL DECOMPRESSION Right 05/26/2017   Procedure: SHOULDER ARTHROSCOPY WITH ROTATOR CUFF REPAIR AND SUBACROMIAL DECOMPRESSION;  Surgeon: Tania Ade, MD;  Location: [facility];  Service: Orthopedics;  Laterality: Right;  SHOULDER ARTHROSCOPY WITH ROTATOR CUFF REPAIR AND SUBACROMIAL DECOMPRESSION  . TOTAL HIP ARTHROPLASTY  10/06/11   left  . TOTAL HIP ARTHROPLASTY  10/06/2011   Procedure: TOTAL HIP ARTHROPLASTY;  Surgeon: Kerin Salen;  Location: [facility];  Service: Orthopedics;  Laterality: Left;    Allergies  Allergen Reactions  . Penicillins Swelling,  Rash and Other (See Comments)    Arms and eyes swell & skin breaks out  Has patient had a PCN reaction causing immediate rash, facial/tongue/throat swelling, SOB or lightheadedness with hypotension: Yes Has patient had a PCN reaction causing severe rash involving mucus membranes or skin necrosis: Yes Has patient had a PCN reaction that required hospitalization: No Has patient had a PCN reaction occurring within the last 10 years: No If all of the above answers are "NO", then may proceed with Cephalosporin use.   . Atorvastatin     Other  reaction(s): Unknown  . Penicillin G     Unknown  . Rosuvastatin     Other reaction(s): Unknown    Outpatient Encounter Medications as of 10/27/2020  Medication Sig  . allopurinol (ZYLOPRIM) 100 MG tablet Take 0.5 tablets (50 mg total) by mouth every Monday, Wednesday, and Friday.  Marland Kitchen apixaban (ELIQUIS) 5 MG TABS tablet Take 1 tablet (5 mg total) by mouth 2 (two) times daily.  . ferrous sulfate 325 (65 FE) MG tablet Take 1 tablet (325 mg total) by mouth daily with breakfast.  . furosemide (LASIX) 40 MG tablet Take 1 tablet (40 mg total) by mouth 2 (two) times daily.  . metoprolol succinate (TOPROL-XL) 100 MG 24 hr tablet Take 1 tablet (100 mg total) by mouth daily. Take with or immediately following a meal.  . Multiple Vitamin (MULTIVITAMIN WITH MINERALS) TABS tablet Take 1 tablet by mouth daily after breakfast.  . polyethylene glycol (MIRALAX / GLYCOLAX) 17 g packet Take 17 g by mouth 2 (two) times daily as needed for mild constipation.  . senna-docusate (SENOKOT-S) 8.6-50 MG tablet Take 1 tablet by mouth 2 (two) times daily as needed for moderate constipation.  . sevelamer carbonate (RENVELA) 800 MG tablet Take 1 tablet (800 mg total) by mouth 3 (three) times daily with meals.  . sodium bicarbonate 650 MG tablet Take 1 tablet (650 mg total) by mouth 3 (three) times daily.  . vitamin B-12 (CYANOCOBALAMIN) 1000 MCG tablet Take 1,000 mcg by mouth daily.   No facility-administered encounter medications on file as of 10/27/2020.    Review of Systems  GENERAL: No change in appetite, no fatigue, no weight changes, no fever, chills or weakness MOUTH and THROAT: Denies oral discomfort, gingival pain or bleeding, RESPIRATORY: no cough, SOB, DOE, wheezing, hemoptysis CARDIAC: No chest pain, edema or palpitations GI: No abdominal pain, diarrhea, constipation, heart burn, nausea or vomiting GU: Denies dysuria, frequency, hematuria, incontinence, or discharge NEUROLOGICAL: Denies dizziness,  syncope, numbness, or headache PSYCHIATRIC: Denies feelings of depression or anxiety. No report of hallucinations, insomnia, paranoia, or agitation   Immunization History  Administered Date(s) Administered  . Hepatitis B, adult 04/29/2015, 06/06/2015  . Influenza Split 09/05/2015, 10/10/2020  . Influenza,inj,Quad PF,6+ Mos 07/14/2011, 10/10/2020  . PFIZER SARS-COV-2 Vaccination 03/05/2020, 03/26/2020  . Pneumococcal Polysaccharide-23 07/14/2011   Pertinent  Health Maintenance Due  Topic Date Due  . FOOT EXAM  Never done  . OPHTHALMOLOGY EXAM  Never done  . URINE MICROALBUMIN  Never done  . COLONOSCOPY  Never done  . HEMOGLOBIN A1C  04/11/2021  . INFLUENZA VACCINE  Completed    Vitals:   10/27/20 1429  BP: 102/60  Pulse: 77  Resp: 16  Temp: (!) 97.5 F (36.4 C)  TempSrc: Oral  Weight: 252 lb (114.3 kg)  Height: 5\' 9"  (1.753 m)   Body mass index is 37.21 kg/m.  Physical Exam  GENERAL APPEARANCE: Well nourished. In no acute distress.  Morbidly obese. SKIN:  Has a nodule with drainage on left side of scrotum. MOUTH and THROAT: Lips are without lesions. Oral mucosa is moist and without lesions.  RESPIRATORY: Breathing is even & unlabored, BS CTAB CARDIAC: RRR, no murmur,no extra heart sounds, no edema GI: Abdomen soft, normal BS, no masses, no tenderness EXTREMITIES:  Able to move X 4 extremities.  Left decreased knee AV fistula. NEUROLOGICAL: There is no tremor. Speech is clear. Alert and oriented X 3. PSYCHIATRIC:  Affect and behavior are appropriate  Labs reviewed: Recent Labs    10/14/20 0157 10/15/20 0158 10/16/20 0102  NA 139 137 137  K 3.9 4.1 3.9  CL 104 103 102  CO2 22 21* 21*  GLUCOSE 101* 90 91  BUN 102* 109* 120*  CREATININE 4.55* 4.10* 4.48*  CALCIUM 11.0* 11.0* 11.2*  MG 1.9 1.9 1.8  PHOS 5.2* 5.1* 5.9*   Recent Labs    04/29/20 0826 07/26/20 1409 07/28/20 0307 10/10/20 0150 10/14/20 0157 10/15/20 0158 10/16/20 0102  AST 20 45* 37  --    --   --   --   ALT 14 15 17   --   --   --   --   ALKPHOS 61 73 63  --   --   --   --   BILITOT 0.8 1.0 0.6  --   --   --   --   PROT 8.5* 8.7* 7.7  --   --   --   --   ALBUMIN 3.1* 3.2* 2.9*   < > 2.8* 2.8* 2.8*   < > = values in this interval not displayed.   Recent Labs    08/08/20 0000 09/11/20 1618 10/08/20 1446 10/09/20 0343 10/14/20 0157 10/15/20 0158 10/16/20 0102  WBC 6.4 7.5 8.7   < > 8.6 9.4 9.0  NEUTROABS 4 5.6 7.0  --   --   --   --   HGB 8.2* 8.3* 8.0*   < > 8.7* 8.5* 8.5*  HCT 26* 27.3* 26.8*   < > 28.2* 26.4* 27.4*  MCV  --  107.5* 106.8*   < > 100.7* 100.0 100.4*  PLT 177 162 181   < > 177 190 181   < > = values in this interval not displayed.   Lab Results  Component Value Date   TSH 1.804 10/08/2020   Lab Results  Component Value Date   HGBA1C 5.5 10/11/2020    Significant Diagnostic Results in last 30 days:  CT Head Wo Contrast  Result Date: 10/08/2020 CLINICAL DATA:  Fall, moderate to severe. EXAM: CT HEAD WITHOUT CONTRAST TECHNIQUE: Contiguous axial images were obtained from the base of the skull through the vertex without intravenous contrast. COMPARISON:  07/26/2020 head CT. FINDINGS: Brain: No evidence of parenchymal hemorrhage or extra-axial fluid collection. No mass lesion, mass effect, or midline shift. No CT evidence of acute infarction. Nonspecific mild subcortical and periventricular white matter hypodensity, most in keeping with chronic small vessel ischemic change. Generalized cerebral volume loss. No ventriculomegaly. Vascular: No acute abnormality. Skull: No evidence of calvarial fracture. Sinuses/Orbits: The visualized paranasal sinuses are essentially clear. Other:  The mastoid air cells are unopacified. IMPRESSION: 1. No evidence of acute intracranial abnormality. No evidence of calvarial fracture. 2. Generalized cerebral volume loss and mild chronic small vessel ischemic changes in the cerebral white matter. Electronically Signed   By: Ilona Sorrel M.D.   On: 10/08/2020 15:35   DG CHEST PORT 1 VIEW  Result Date: 10/09/2020 CLINICAL DATA:  Shortness of breath EXAM: PORTABLE CHEST 1 VIEW COMPARISON:  07/27/2020 FINDINGS: Cardiac shadow is enlarged stable. Lungs are well aerated bilaterally. Mild central vascular congestion is noted without interstitial edema. No focal infiltrate is noted. Rotation of the patient to the right accentuates the mediastinal markings. IMPRESSION: Increased vascular congestion without edema greater no other focal abnormality is noted. Electronically Signed   By: Inez Catalina M.D.   On: 10/09/2020 00:36   ECHOCARDIOGRAM COMPLETE  Result Date: 10/10/2020    ECHOCARDIOGRAM REPORT   Patient Name:   Mark [name] Date of Exam: 10/09/2020 Medical Rec #:  [id number]        Height:       69.0 in Accession #:    [id number]       Weight:       249.1 lb Date of Birth:  July 25, 1961        BSA:          2.268 m Patient Age:    31 years         BP:           113/70 mmHg Patient Gender: M                HR:           74 bpm. Exam Location:  Inpatient Procedure: 2D Echo, Cardiac Doppler, Color Doppler and Intracardiac            Opacification Agent                             MODIFIED REPORT: This report was modified by Kirk Ruths MD on 10/10/2020 due to Change.  Indications:     Atrial fibrillation  History:         Patient has prior history of Echocardiogram examinations, most                  recent 07/27/2020. Signs/Symptoms:Shortness of Breath; Risk                  Factors:Hypertension. CKD.  Sonographer:     Clayton Lefort RDCS (AE) Referring Phys:  [place] Diagnosing Phys: Kirk Ruths MD  Sonographer Comments: Technically challenging study due to limited acoustic windows, Technically difficult study due to poor echo windows, patient is morbidly obese and no subcostal window. Image acquisition challenging due to respiratory motion. Imaging  windows limited by belt device surrounding abdomen in apical and  subcostal areas. IMPRESSIONS  1. No subcostal views.  2. Left ventricular ejection fraction, by estimation, is 60 to 65%. The left ventricle has normal function. The left ventricle has no regional wall motion abnormalities. There is mild left ventricular hypertrophy. Left ventricular diastolic parameters are indeterminate.  3. Right ventricular systolic function is normal. The right ventricular size is normal.  4. Left atrial size was severely dilated.  5. The mitral valve is normal in structure. Mild mitral valve regurgitation. No evidence of mitral stenosis. Severe mitral annular calcification.  6. The aortic valve is normal in structure. Aortic valve regurgitation is trivial. Moderate aortic valve stenosis. FINDINGS  Left Ventricle: Left ventricular ejection fraction, by estimation, is 60 to 65%. The left ventricle has normal function. The left ventricle has no regional wall motion abnormalities. Definity contrast agent was given IV to delineate the left ventricular  endocardial borders. The left ventricular internal cavity size was normal in size. There  is mild left ventricular hypertrophy. Left ventricular diastolic parameters are indeterminate. Right Ventricle: The right ventricular size is normal. Right ventricular systolic function is normal. Left Atrium: Left atrial size was severely dilated. Right Atrium: Right atrial size was normal in size. Pericardium: There is no evidence of pericardial effusion. Mitral Valve: The mitral valve is normal in structure. Severe mitral annular calcification. Mild mitral valve regurgitation. No evidence of mitral valve stenosis. Tricuspid Valve: The tricuspid valve is normal in structure. Tricuspid valve regurgitation is trivial. No evidence of tricuspid stenosis. Aortic Valve: The aortic valve is normal in structure. Aortic valve regurgitation is trivial. Moderate aortic stenosis is present. Aortic valve mean gradient measures 29.2 mmHg. Aortic valve peak gradient measures  53.9 mmHg. Aortic valve area, by VTI measures 1.14 cm. Pulmonic Valve: The pulmonic valve was not well visualized. Pulmonic valve regurgitation is not visualized. No evidence of pulmonic stenosis. Aorta: The aortic root was not well visualized. Venous: The inferior vena cava was not well visualized.  Additional Comments: No subcostal views.  LEFT VENTRICLE PLAX 2D LVIDd:         4.70 cm LVIDs:         3.60 cm LV PW:         1.40 cm LV IVS:        1.30 cm LVOT diam:     2.00 cm LV SV:         90 LV SV Index:   40 LVOT Area:     3.14 cm  RIGHT VENTRICLE RV Basal diam:  3.30 cm RV S prime:     12.80 cm/s TAPSE (M-mode): 1.7 cm LEFT ATRIUM              Index       RIGHT ATRIUM           Index LA diam:        5.10 cm  2.25 cm/m  RA Area:     31.30 cm LA Vol (A2C):   152.0 ml 67.02 ml/m RA Volume:   113.00 ml 49.82 ml/m LA Vol (A4C):   226.0 ml 99.64 ml/m LA Biplane Vol: 186.0 ml 82.01 ml/m  AORTIC VALVE AV Area (Vmax):    0.98 cm AV Area (Vmean):   1.04 cm AV Area (VTI):     1.14 cm AV Vmax:           367.20 cm/s AV Vmean:          246.200 cm/s AV VTI:            0.786 m AV Peak Grad:      53.9 mmHg AV Mean Grad:      29.2 mmHg LVOT Vmax:         114.00 cm/s LVOT Vmean:        81.560 cm/s LVOT VTI:          0.285 m LVOT/AV VTI ratio: 0.36  AORTA Ao Root diam: 3.50 cm TRICUSPID VALVE TR Peak grad:   21.3 mmHg TR Vmax:        231.00 cm/s  SHUNTS Systemic VTI:  0.29 m Systemic Diam: 2.00 cm Kirk Ruths MD Electronically signed by Kirk Ruths MD Signature Date/Time: 10/09/2020/1:54:43 PM    Final (Updated)    DG Hip Port Unilat W or [place] Pelvis 1 View Left  Result Date: 10/08/2020 CLINICAL DATA:  Status post reduction EXAM: DG HIP (WITH OR WITHOUT PELVIS) 1V PORT LEFT COMPARISON:  Film from earlier in the same  day. FINDINGS: Left hip prosthesis has now been reduced into the acetabular component. No acute fracture is seen. No soft tissue abnormality is noted. IMPRESSION: Status post reduction. Electronically  Signed   By: Inez Catalina M.D.   On: 10/08/2020 19:55   DG Hip Port Unilat W or Wo Pelvis 1 View Left  Result Date: 10/08/2020 CLINICAL DATA:  59 year old male status post reduction of left hip dislocation. EXAM: DG HIP (WITH OR WITHOUT PELVIS) 1V PORT LEFT COMPARISON:  Earlier radiograph dated 10/08/2020. FINDINGS: Persistent superior dislocation of the left hip arthroplasty with femoral component superior to the acetabular cup. There is probable impaction of the femoral component on the superior aspect of the acetabular cup. No acute fracture. The bones are osteopenic. IMPRESSION: Persistent superior dislocation of the left hip arthroplasty with possible impaction. Electronically Signed   By: Anner Crete M.D.   On: 10/08/2020 18:32   DG Hip Unilat W or Wo Pelvis 2-3 Views Left  Result Date: 10/08/2020 CLINICAL DATA:  Left hip arthroplasty dislocation. EXAM: DG HIP (WITH OR WITHOUT PELVIS) 2-3V LEFT COMPARISON:  09/11/2020 FINDINGS: The femoral component of the left hip arthroplasty has dislocated superior to the acetabular component. No acute fracture. No evidence prosthesis component loosening. IMPRESSION: Dislocated left total hip arthroplasty. Electronically Signed   By: Lajean Manes M.D.   On: 10/08/2020 15:05    Assessment/Plan  1. Furuncle -  Will start on doxycycline 100 mg twice a day x10 days and Florastor 250 mg twice a day x13 days -Keep scrotal area clean and dry  2. Anemia of chronic disease Lab Results  Component Value Date   HGB 8.5 (L) 10/16/2020   - S/P transfusion of 1 unit PRBC in the hospital -Stable, continue ferrous sulfate 325 mg daily    Family/ staff Communication: Discussed plan of care with resident and charge nurse.  Labs/tests ordered: None  Goals of care:   Short-term care  Durenda Age, DNP, MSN, FNP-BC [facility] [phone] (Monday-Friday 8:00 a.m. - 5:00 p.m.) [phone] (after hours)

## 2020-10-29 ENCOUNTER — Non-Acute Institutional Stay (SKILLED_NURSING_FACILITY): Payer: 59 | Admitting: Adult Health

## 2020-10-29 ENCOUNTER — Encounter: Payer: Self-pay | Admitting: Adult Health

## 2020-10-29 DIAGNOSIS — N184 Chronic kidney disease, stage 4 (severe): Secondary | ICD-10-CM

## 2020-10-29 DIAGNOSIS — D638 Anemia in other chronic diseases classified elsewhere: Secondary | ICD-10-CM

## 2020-10-29 DIAGNOSIS — M1A9XX0 Chronic gout, unspecified, without tophus (tophi): Secondary | ICD-10-CM

## 2020-10-29 DIAGNOSIS — L0292 Furuncle, unspecified: Secondary | ICD-10-CM | POA: Diagnosis not present

## 2020-10-29 DIAGNOSIS — S73005D Unspecified dislocation of left hip, subsequent encounter: Secondary | ICD-10-CM

## 2020-10-29 DIAGNOSIS — I4892 Unspecified atrial flutter: Secondary | ICD-10-CM

## 2020-10-29 DIAGNOSIS — I5033 Acute on chronic diastolic (congestive) heart failure: Secondary | ICD-10-CM

## 2020-10-29 MED ORDER — APIXABAN 5 MG PO TABS: 5.0000 mg | ORAL_TABLET | Freq: Two times a day (BID) | ORAL | 0 refills | Status: DC

## 2020-10-29 MED ORDER — SODIUM BICARBONATE 650 MG PO TABS: 650.0000 mg | ORAL_TABLET | Freq: Three times a day (TID) | ORAL | 0 refills | Status: DC

## 2020-10-29 MED ORDER — DOXYCYCLINE HYCLATE 100 MG PO TABS: 100.0000 mg | ORAL_TABLET | Freq: Two times a day (BID) | ORAL | 0 refills | Status: AC

## 2020-10-29 MED ORDER — METOPROLOL SUCCINATE ER 100 MG PO TB24: 100.0000 mg | ORAL_TABLET | Freq: Every day | ORAL | 0 refills | Status: DC

## 2020-10-29 MED ORDER — SACCHAROMYCES BOULARDII 250 MG PO CAPS: 250.0000 mg | ORAL_CAPSULE | Freq: Two times a day (BID) | ORAL | 0 refills | Status: AC

## 2020-10-29 MED ORDER — FUROSEMIDE 40 MG PO TABS: 40.0000 mg | ORAL_TABLET | Freq: Two times a day (BID) | ORAL | 0 refills | Status: DC

## 2020-10-29 MED ORDER — ALLOPURINOL 100 MG PO TABS: 50.0000 mg | ORAL_TABLET | ORAL | 0 refills | Status: DC

## 2020-10-29 MED ORDER — SEVELAMER CARBONATE 800 MG PO TABS: 800.0000 mg | ORAL_TABLET | Freq: Three times a day (TID) | ORAL | 0 refills | Status: AC

## 2020-10-29 NOTE — Progress Notes (Signed)
Location:  [place] Room Number: 662-H Place of Service:  SNF (31) Provider:  Durenda Age, DNP, FNP-BC  Patient Care Team: Delrae Rend, MD as PCP - General (Internal Medicine) Lorretta Harp, MD as PCP - Cardiology (Cardiology) Jamal Maes, MD as Consulting Physician (Nephrology)  Extended Emergency Contact Information Primary Emergency Contact: Glennon [place] Phone: [phone] Relation: Brother  Code Status:  FULL CODE  Goals of care: Advanced Directive information Advanced Directives 10/09/2020  Does Patient Have a Medical Advance Directive? No  Does patient want to make changes to medical advance directive? -  Would patient like information on creating a medical advance directive? No - Patient declined  Pre-existing out of facility DNR order (yellow form or pink MOST form) -     Chief Complaint  Patient presents with  . Discharge Note    Patient is seen for discharge from SNF    HPI:  Pt is a 59 y.o. male seen to at day for discharge home on 10/30/2020 with Home health PT and OT.    He was admitted to [facility] on 10/16/20 post [facility] hospitalization 10/08/20 to 10/16/20.  He presented to the hospital with left hip pain and imaging revealed left hip dislocation that was reduced by orthopedic surgery.  He was also in atrial flutter with RVR and was started on IV Cardizem.  Cardiology was consulted.  He was then transitioned to oral metoprolol.  His heart rate improved with titration of his metoprolol.  He is on Eliquis for anticoagulation.  Orthopedic surgery recommended hinged abduction brace set to 60 degrees flexion and 15 degrees abduction or knee immobilizer if difficulty tolerating brace at night.  He has a PMH of right knee arthritis, anemia, bleeding ulcer, chronic kidney disease stage IV, gout, hypertension, osteoporosis and pneumonia.  He developed Furuncle in his  scrotum while at [facility]. He was started on Doxycycline.   Patient was admitted to this facility for short-term rehabilitation after the patient's recent hospitalization.  Patient has completed SNF rehabilitation and therapy has cleared the patient for discharge.   Past Medical History:  Diagnosis Date  . Anemia    labs today indicate (09/27/11)  . Arthritis    right knee  . Bleeding ulcer    hx of, required 6 units blood  . Blood transfusion    6 units with ulcer repair surgery  . Chronic kidney disease    stage IV, has fistula, never started diaylsis  . Complication of anesthesia    little slow to wake up with last surgery  . Dyspnea    with exertion  . Gout   . Hip dislocation, left ([place]) 10/09/2020  . Hypertension   . Osteoporosis   . Pneumonia    Past Surgical History:  Procedure Laterality Date  . AV FISTULA PLACEMENT Left 09/07/2016   Procedure: ARTERIOVENOUS (AV) FISTULA CREATION LEFT ARM;  Surgeon: Angelia Mould, MD;  Location: [place];  Service: Vascular;  Laterality: Left;  . COLONOSCOPY    . EXTERNAL EAR SURGERY    . HIP CLOSED REDUCTION Left 07/27/2020   Procedure: CLOSED REDUCTION HIP;  Surgeon: Marchia Bond, MD;  Location: [place];  Service: Orthopedics;  Laterality: Left;  . HIP PINNING  11/2010  . JOINT REPLACEMENT    . OTHER SURGICAL HISTORY  ~ 08/1998   repair bleeding ulcer  . SHOULDER ARTHROSCOPY WITH ROTATOR CUFF REPAIR AND SUBACROMIAL DECOMPRESSION Right 05/26/2017   Procedure:  SHOULDER ARTHROSCOPY WITH ROTATOR CUFF REPAIR AND SUBACROMIAL DECOMPRESSION;  Surgeon: Tania Ade, MD;  Location: [place];  Service: Orthopedics;  Laterality: Right;  SHOULDER ARTHROSCOPY WITH ROTATOR CUFF REPAIR AND SUBACROMIAL DECOMPRESSION  . TOTAL HIP ARTHROPLASTY  10/06/11   left  . TOTAL HIP ARTHROPLASTY  10/06/2011   Procedure: TOTAL HIP ARTHROPLASTY;  Surgeon: Kerin Salen;  Location: [place];  Service: Orthopedics;  Laterality: Left;    Allergies  Allergen  Reactions  . Penicillins Swelling, Rash and Other (See Comments)    Arms and eyes swell & skin breaks out  Has patient had a PCN reaction causing immediate rash, facial/tongue/throat swelling, SOB or lightheadedness with hypotension: Yes Has patient had a PCN reaction causing severe rash involving mucus membranes or skin necrosis: Yes Has patient had a PCN reaction that required hospitalization: No Has patient had a PCN reaction occurring within the last 10 years: No If all of the above answers are "NO", then may proceed with Cephalosporin use.   . Atorvastatin     Other reaction(s): Unknown  . Penicillin G     Unknown  . Rosuvastatin     Other reaction(s): Unknown    Outpatient Encounter Medications as of 10/29/2020  Medication Sig  . allopurinol (ZYLOPRIM) 100 MG tablet Take 0.5 tablets (50 mg total) by mouth every Monday, Wednesday, and Friday.  Marland Kitchen apixaban (ELIQUIS) 5 MG TABS tablet Take 1 tablet (5 mg total) by mouth 2 (two) times daily.  Marland Kitchen doxycycline (VIBRA-TABS) 100 MG tablet Take 100 mg by mouth 2 (two) times daily.  . ferrous sulfate 325 (65 FE) MG tablet Take 1 tablet (325 mg total) by mouth daily with breakfast.  . furosemide (LASIX) 40 MG tablet Take 1 tablet (40 mg total) by mouth 2 (two) times daily.  . metoprolol succinate (TOPROL-XL) 100 MG 24 hr tablet Take 1 tablet (100 mg total) by mouth daily. Take with or immediately following a meal.  . Multiple Vitamin (MULTIVITAMIN WITH MINERALS) TABS tablet Take 1 tablet by mouth daily after breakfast.  . polyethylene glycol (MIRALAX / GLYCOLAX) 17 g packet Take 17 g by mouth 2 (two) times daily as needed for mild constipation.  . saccharomyces boulardii (FLORASTOR) 250 MG capsule Take 250 mg by mouth 2 (two) times daily.  Marland Kitchen senna-docusate (SENOKOT-S) 8.6-50 MG tablet Take 1 tablet by mouth 2 (two) times daily as needed for moderate constipation.  . sevelamer carbonate (RENVELA) 800 MG tablet Take 1 tablet (800 mg total) by mouth  3 (three) times daily with meals.  . sodium bicarbonate 650 MG tablet Take 1 tablet (650 mg total) by mouth 3 (three) times daily.  . vitamin B-12 (CYANOCOBALAMIN) 1000 MCG tablet Take 1,000 mcg by mouth daily.   No facility-administered encounter medications on file as of 10/29/2020.    Review of Systems  GENERAL: No change in appetite, no fatigue, no weight changes, no fever, chills or weakness MOUTH and THROAT: Denies oral discomfort, gingival pain or bleeding RESPIRATORY: no cough, SOB, DOE, wheezing, hemoptysis CARDIAC: No chest pain, edema or palpitations GI: No abdominal pain, diarrhea, constipation, heart burn, nausea or vomiting GU: Denies dysuria, frequency, hematuria, incontinence, or discharge NEUROLOGICAL: Denies dizziness, syncope, numbness, or headache PSYCHIATRIC: Denies feelings of depression or anxiety. No report of hallucinations, insomnia, paranoia, or agitation   Immunization History  Administered Date(s) Administered  . Hepatitis B, adult 04/29/2015, 06/06/2015  . Influenza Split 09/05/2015, 10/10/2020  . Influenza,inj,Quad PF,6+ Mos 07/14/2011, 10/10/2020  . PFIZER SARS-COV-2 Vaccination  03/05/2020, 03/26/2020  . Pneumococcal Polysaccharide-23 07/14/2011   Pertinent  Health Maintenance Due  Topic Date Due  . FOOT EXAM  Never done  . OPHTHALMOLOGY EXAM  Never done  . URINE MICROALBUMIN  Never done  . COLONOSCOPY  Never done  . HEMOGLOBIN A1C  04/11/2021  . INFLUENZA VACCINE  Completed    Vitals:   10/29/20 0948  BP: 105/67  Pulse: 88  Resp: 18  Temp: (!) 97.5 F (36.4 C)  TempSrc: Oral  Weight: 252 lb (114.3 kg)  Height: 5\' 9"  (1.753 m)   Body mass index is 37.21 kg/m.  Physical Exam  GENERAL APPEARANCE: Well nourished. In no acute distress. Morbidly obese. SKIN:  Lesion on left side of scrotum with serous drainage MOUTH and THROAT: Lips are without lesions. Oral mucosa is moist and without lesions. Tongue is normal in shape, size, and  color and without lesions RESPIRATORY: Breathing is even & unlabored, BS CTAB CARDIAC: RRR, no murmur,no extra heart sounds, no edema GI: Abdomen soft, normal BS, no masses, no tenderness EXTREMITIES:  Able to move X 4 extremities NEUROLOGICAL: There is no tremor. Speech is clear. Alert and oriented X 3. PSYCHIATRIC:  Affect and behavior are appropriate  Labs reviewed: Recent Labs    10/14/20 0157 10/15/20 0158 10/16/20 0102  NA 139 137 137  K 3.9 4.1 3.9  CL 104 103 102  CO2 22 21* 21*  GLUCOSE 101* 90 91  BUN 102* 109* 120*  CREATININE 4.55* 4.10* 4.48*  CALCIUM 11.0* 11.0* 11.2*  MG 1.9 1.9 1.8  PHOS 5.2* 5.1* 5.9*   Recent Labs    04/29/20 0826 07/26/20 1409 07/28/20 0307 10/10/20 0150 10/14/20 0157 10/15/20 0158 10/16/20 0102  AST 20 45* 37  --   --   --   --   ALT 14 15 17   --   --   --   --   ALKPHOS 61 73 63  --   --   --   --   BILITOT 0.8 1.0 0.6  --   --   --   --   PROT 8.5* 8.7* 7.7  --   --   --   --   ALBUMIN 3.1* 3.2* 2.9*   < > 2.8* 2.8* 2.8*   < > = values in this interval not displayed.   Recent Labs    08/08/20 0000 09/11/20 1618 10/08/20 1446 10/09/20 0343 10/14/20 0157 10/15/20 0158 10/16/20 0102  WBC 6.4 7.5 8.7   < > 8.6 9.4 9.0  NEUTROABS 4 5.6 7.0  --   --   --   --   HGB 8.2* 8.3* 8.0*   < > 8.7* 8.5* 8.5*  HCT 26* 27.3* 26.8*   < > 28.2* 26.4* 27.4*  MCV  --  107.5* 106.8*   < > 100.7* 100.0 100.4*  PLT 177 162 181   < > 177 190 181   < > = values in this interval not displayed.   Lab Results  Component Value Date   TSH 1.804 10/08/2020   Lab Results  Component Value Date   HGBA1C 5.5 10/11/2020   Significant Diagnostic Results in last 30 days:  CT Head Wo Contrast  Result Date: 10/08/2020 CLINICAL DATA:  Fall, moderate to severe. EXAM: CT HEAD WITHOUT CONTRAST TECHNIQUE: Contiguous axial images were obtained from the base of the skull through the vertex without intravenous contrast. COMPARISON:  07/26/2020 head CT.  FINDINGS: Brain: No evidence of parenchymal hemorrhage  or extra-axial fluid collection. No mass lesion, mass effect, or midline shift. No CT evidence of acute infarction. Nonspecific mild subcortical and periventricular white matter hypodensity, most in keeping with chronic small vessel ischemic change. Generalized cerebral volume loss. No ventriculomegaly. Vascular: No acute abnormality. Skull: No evidence of calvarial fracture. Sinuses/Orbits: The visualized paranasal sinuses are essentially clear. Other:  The mastoid air cells are unopacified. IMPRESSION: 1. No evidence of acute intracranial abnormality. No evidence of calvarial fracture. 2. Generalized cerebral volume loss and mild chronic small vessel ischemic changes in the cerebral white matter. Electronically Signed   By: Ilona Sorrel M.D.   On: 10/08/2020 15:35   DG CHEST PORT 1 VIEW  Result Date: 10/09/2020 CLINICAL DATA:  Shortness of breath EXAM: PORTABLE CHEST 1 VIEW COMPARISON:  07/27/2020 FINDINGS: Cardiac shadow is enlarged stable. Lungs are well aerated bilaterally. Mild central vascular congestion is noted without interstitial edema. No focal infiltrate is noted. Rotation of the patient to the right accentuates the mediastinal markings. IMPRESSION: Increased vascular congestion without edema greater no other focal abnormality is noted. Electronically Signed   By: Inez Catalina M.D.   On: 10/09/2020 00:36   ECHOCARDIOGRAM COMPLETE  Result Date: 10/10/2020    ECHOCARDIOGRAM REPORT   Patient Name:   NOEL RODIER Date of Exam: 10/09/2020 Medical Rec #:  [id number]        Height:       69.0 in Accession #:    [id number]       Weight:       249.1 lb Date of Birth:  Jul 09, 1961        BSA:          2.268 m Patient Age:    70 years         BP:           113/70 mmHg Patient Gender: M                HR:           74 bpm. Exam Location:  Inpatient Procedure: 2D Echo, Cardiac Doppler, Color Doppler and Intracardiac            Opacification Agent                              MODIFIED REPORT: This report was modified by Kirk Ruths MD on 10/10/2020 due to Change.  Indications:     Atrial fibrillation  History:         Patient has prior history of Echocardiogram examinations, most                  recent 07/27/2020. Signs/Symptoms:Shortness of Breath; Risk                  Factors:Hypertension. CKD.  Sonographer:     Clayton Lefort RDCS (AE) Referring Phys:  [place] Diagnosing Phys: Kirk Ruths MD  Sonographer Comments: Technically challenging study due to limited acoustic windows, Technically difficult study due to poor echo windows, patient is morbidly obese and no subcostal window. Image acquisition challenging due to respiratory motion. Imaging  windows limited by belt device surrounding abdomen in apical and subcostal areas. IMPRESSIONS  1. No subcostal views.  2. Left ventricular ejection fraction, by estimation, is 60 to 65%. The left ventricle has normal function. The left ventricle has no regional wall motion abnormalities. There is mild left ventricular hypertrophy. Left ventricular diastolic  parameters are indeterminate.  3. Right ventricular systolic function is normal. The right ventricular size is normal.  4. Left atrial size was severely dilated.  5. The mitral valve is normal in structure. Mild mitral valve regurgitation. No evidence of mitral stenosis. Severe mitral annular calcification.  6. The aortic valve is normal in structure. Aortic valve regurgitation is trivial. Moderate aortic valve stenosis. FINDINGS  Left Ventricle: Left ventricular ejection fraction, by estimation, is 60 to 65%. The left ventricle has normal function. The left ventricle has no regional wall motion abnormalities. Definity contrast agent was given IV to delineate the left ventricular  endocardial borders. The left ventricular internal cavity size was normal in size. There is mild left ventricular hypertrophy. Left ventricular diastolic parameters are  indeterminate. Right Ventricle: The right ventricular size is normal. Right ventricular systolic function is normal. Left Atrium: Left atrial size was severely dilated. Right Atrium: Right atrial size was normal in size. Pericardium: There is no evidence of pericardial effusion. Mitral Valve: The mitral valve is normal in structure. Severe mitral annular calcification. Mild mitral valve regurgitation. No evidence of mitral valve stenosis. Tricuspid Valve: The tricuspid valve is normal in structure. Tricuspid valve regurgitation is trivial. No evidence of tricuspid stenosis. Aortic Valve: The aortic valve is normal in structure. Aortic valve regurgitation is trivial. Moderate aortic stenosis is present. Aortic valve mean gradient measures 29.2 mmHg. Aortic valve peak gradient measures 53.9 mmHg. Aortic valve area, by VTI measures 1.14 cm. Pulmonic Valve: The pulmonic valve was not well visualized. Pulmonic valve regurgitation is not visualized. No evidence of pulmonic stenosis. Aorta: The aortic root was not well visualized. Venous: The inferior vena cava was not well visualized.  Additional Comments: No subcostal views.  LEFT VENTRICLE PLAX 2D LVIDd:         4.70 cm LVIDs:         3.60 cm LV PW:         1.40 cm LV IVS:        1.30 cm LVOT diam:     2.00 cm LV SV:         90 LV SV Index:   40 LVOT Area:     3.14 cm  RIGHT VENTRICLE RV Basal diam:  3.30 cm RV S prime:     12.80 cm/s TAPSE (M-mode): 1.7 cm LEFT ATRIUM              Index       RIGHT ATRIUM           Index LA diam:        5.10 cm  2.25 cm/m  RA Area:     31.30 cm LA Vol (A2C):   152.0 ml 67.02 ml/m RA Volume:   113.00 ml 49.82 ml/m LA Vol (A4C):   226.0 ml 99.64 ml/m LA Biplane Vol: 186.0 ml 82.01 ml/m  AORTIC VALVE AV Area (Vmax):    0.98 cm AV Area (Vmean):   1.04 cm AV Area (VTI):     1.14 cm AV Vmax:           367.20 cm/s AV Vmean:          246.200 cm/s AV VTI:            0.786 m AV Peak Grad:      53.9 mmHg AV Mean Grad:      29.2 mmHg  LVOT Vmax:         114.00 cm/s LVOT Vmean:  81.560 cm/s LVOT VTI:          0.285 m LVOT/AV VTI ratio: 0.36  AORTA Ao Root diam: 3.50 cm TRICUSPID VALVE TR Peak grad:   21.3 mmHg TR Vmax:        231.00 cm/s  SHUNTS Systemic VTI:  0.29 m Systemic Diam: 2.00 cm Kirk Ruths MD Electronically signed by Kirk Ruths MD Signature Date/Time: 10/09/2020/1:54:43 PM    Final (Updated)    DG Hip [place] W or [place] Pelvis 1 View Left  Result Date: 10/08/2020 CLINICAL DATA:  Status post reduction EXAM: DG HIP (WITH OR WITHOUT PELVIS) 1V PORT LEFT COMPARISON:  Film from earlier in the same day. FINDINGS: Left hip prosthesis has now been reduced into the acetabular component. No acute fracture is seen. No soft tissue abnormality is noted. IMPRESSION: Status post reduction. Electronically Signed   By: Inez Catalina M.D.   On: 10/08/2020 19:55   DG Hip Port Unilat W or Wo Pelvis 1 View Left  Result Date: 10/08/2020 CLINICAL DATA:  59 year old male status post reduction of left hip dislocation. EXAM: DG HIP (WITH OR WITHOUT PELVIS) 1V PORT LEFT COMPARISON:  Earlier radiograph dated 10/08/2020. FINDINGS: Persistent superior dislocation of the left hip arthroplasty with femoral component superior to the acetabular cup. There is probable impaction of the femoral component on the superior aspect of the acetabular cup. No acute fracture. The bones are osteopenic. IMPRESSION: Persistent superior dislocation of the left hip arthroplasty with possible impaction. Electronically Signed   By: Anner Crete M.D.   On: 10/08/2020 18:32   DG Hip Unilat W or Wo Pelvis 2-3 Views Left  Result Date: 10/08/2020 CLINICAL DATA:  Left hip arthroplasty dislocation. EXAM: DG HIP (WITH OR WITHOUT PELVIS) 2-3V LEFT COMPARISON:  09/11/2020 FINDINGS: The femoral component of the left hip arthroplasty has dislocated superior to the acetabular component. No acute fracture. No evidence prosthesis component loosening. IMPRESSION: Dislocated  left total hip arthroplasty. Electronically Signed   By: Lajean Manes M.D.   On: 10/08/2020 15:05    Assessment/Plan  1. Closed dislocation of left hip, subsequent encounter -  S/P hip reduction by orthopedic surgery on 10/09/2020 -Orthopedic recommended hinged abduction brace set to 60 degrees flexion and 15 degrees abduction. Follow-up with orthopedics   2. Atrial flutter with rapid ventricular response (HCC) -Follow-up with cardiology - apixaban (ELIQUIS) 5 MG TABS tablet; Take 1 tablet (5 mg total) by mouth 2 (two) times daily.  Dispense: 60 tablet; Refill: 0 - metoprolol succinate (TOPROL-XL) 100 MG 24 hr tablet; Take 1 tablet (100 mg total) by mouth daily. Take with or immediately following a meal.  Dispense: 30 tablet; Refill: 0  3. Furuncle - doxycycline (VIBRA-TABS) 100 MG tablet; Take 1 tablet (100 mg total) by mouth 2 (two) times daily for 9 days.  Dispense: 18 tablet; Refill: 0 - saccharomyces boulardii (FLORASTOR) 250 MG capsule; Take 1 capsule (250 mg total) by mouth 2 (two) times daily for 12 days.  Dispense: 24 capsule; Refill: 0  4. Anemia of chronic disease Lab Results  Component Value Date   HGB 8.5 (L) 10/16/2020   -  Stable  -   Continue ferrous sulfate 325 mg daily  5. CKD (chronic kidney disease), stage IV ([place]) -  Follow up with nephrology - sevelamer carbonate (RENVELA) 800 MG tablet; Take 1 tablet (800 mg total) by mouth 3 (three) times daily with meals.  Dispense: 90 tablet; Refill: 0 - sodium bicarbonate 650 MG tablet; Take 1  tablet (650 mg total) by mouth 3 (three) times daily.  Dispense: 90 tablet; Refill: 0  6. Chronic gout without tophus, unspecified cause, unspecified site - allopurinol (ZYLOPRIM) 100 MG tablet; Take 0.5 tablets (50 mg total) by mouth every Monday, Wednesday, and Friday.  Dispense: 6 tablet; Refill: 0  7. Acute on chronic diastolic CHF (congestive heart failure) (HCC) - furosemide (LASIX) 40 MG tablet; Take 1 tablet (40 mg total)  by mouth 2 (two) times daily.  Dispense: 60 tablet; Refill: 0 - metoprolol succinate (TOPROL-XL) 100 MG 24 hr tablet; Take 1 tablet (100 mg total) by mouth daily. Take with or immediately following a meal.  Dispense: 30 tablet; Refill: 0      I have filled out patient's discharge paperwork and e-prescribed medications.  Patient will have home health PT and OT.   DME provided:  None   Total discharge time: Greater than 30 minutes Greater than 50% was spent in counseling and coordination of care.    Discharge time involved coordination of the discharge process with social worker, nursing staff and therapy department. Medical justification for home health services verified.    Durenda Age, DNP, MSN, FNP-BC [facility] and [facility] [phone] (Monday-Friday 8:00 a.m. - 5:00 p.m.) [phone] (after hours)

## 2020-11-11 ENCOUNTER — Ambulatory Visit: Payer: 59 | Admitting: Internal Medicine

## 2020-11-18 ENCOUNTER — Ambulatory Visit: Payer: 59 | Admitting: Internal Medicine

## 2020-11-24 ENCOUNTER — Other Ambulatory Visit: Payer: Self-pay | Admitting: Adult Health

## 2020-11-24 DIAGNOSIS — I4892 Unspecified atrial flutter: Secondary | ICD-10-CM

## 2020-11-24 DIAGNOSIS — I5033 Acute on chronic diastolic (congestive) heart failure: Secondary | ICD-10-CM

## 2020-11-24 DIAGNOSIS — N184 Chronic kidney disease, stage 4 (severe): Secondary | ICD-10-CM

## 2020-12-05 ENCOUNTER — Ambulatory Visit: Payer: 59 | Admitting: Internal Medicine

## 2020-12-05 ENCOUNTER — Other Ambulatory Visit: Payer: Self-pay

## 2020-12-05 ENCOUNTER — Encounter: Payer: Self-pay | Admitting: Internal Medicine

## 2020-12-05 VITALS — BP 115/75 | HR 92 | Ht 69.0 in | Wt 254.8 lb

## 2020-12-05 DIAGNOSIS — N185 Chronic kidney disease, stage 5: Secondary | ICD-10-CM

## 2020-12-05 DIAGNOSIS — I4892 Unspecified atrial flutter: Secondary | ICD-10-CM

## 2020-12-05 DIAGNOSIS — I10 Essential (primary) hypertension: Secondary | ICD-10-CM | POA: Diagnosis not present

## 2020-12-05 DIAGNOSIS — I4819 Other persistent atrial fibrillation: Secondary | ICD-10-CM | POA: Diagnosis not present

## 2020-12-05 MED ORDER — APIXABAN 5 MG PO TABS: 5.0000 mg | ORAL_TABLET | Freq: Two times a day (BID) | ORAL | 0 refills | Status: AC

## 2020-12-05 NOTE — Progress Notes (Signed)
Cardiology Office Note:    Date:  12/05/2020   ID:  Mark [name], DOB Apr 07, 1961, MRN [id number]  PCP:  Delrae Rend, MD  Cardiologist:  Quay Burow, MD  Electrophysiologist:  None   Referring MD: Delrae Rend, MD   Chief Complaint/Reason for Referral: Persistent atrial fibrillation  History of Present Illness:    Mark [name] is a 60 y.o. male with a history of moderate aortic stenosis, CKD V, hypertension, gout, atrial flutter, and recurrent hip dislocations recently admitted with hip dislocation likely prompting atrial flutter with RVR. He was planned for outpatient cardioversion to be performed after our office visit today due to interruptions in anticoagulation prior to hospitalization.   He stopped his Eliquis approximately 5-6 days ago due to a superficial skin lesion on his abdomen that was oozing blood. I reminded him that to plan cardioversion we will need 30 days of uninterrupted AC prior to procedure, and at least 30 days of uninterrupted AC afterward due to atrial stunning. He understands and will try to do so. He is in agreement about continued anticoagulation for stroke prevention. He notes dyspnea on exertion but no chest pain. He is largely unaware of the atrial flutter/atrial fibrillation, with no palpitations. No syncope or presyncope.   His left knee is hurting today, but his right hip which was the source of trouble during his hospitalization has improved.   He continues to make some urine, and has a long standing left forearm fistula that is unused. He has some LE swelling, diuretics primarily managed by nephrology.   Past Medical History:  Diagnosis Date  . Anemia    labs today indicate (09/27/11)  . Arthritis    right knee  . Bleeding ulcer    hx of, required 6 units blood  . Blood transfusion    6 units with ulcer repair surgery  . Chronic kidney disease    stage IV, has fistula, never started diaylsis  . Complication of anesthesia    little  slow to wake up with last surgery  . Dyspnea    with exertion  . Gout   . Hip dislocation, left ([place]) 10/09/2020  . Hypertension   . Osteoporosis   . Pneumonia     Past Surgical History:  Procedure Laterality Date  . AV FISTULA PLACEMENT Left 09/07/2016   Procedure: ARTERIOVENOUS (AV) FISTULA CREATION LEFT ARM;  Surgeon: Angelia Mould, MD;  Location: [place];  Service: Vascular;  Laterality: Left;  . COLONOSCOPY    . EXTERNAL EAR SURGERY    . HIP CLOSED REDUCTION Left 07/27/2020   Procedure: CLOSED REDUCTION HIP;  Surgeon: Marchia Bond, MD;  Location: [place];  Service: Orthopedics;  Laterality: Left;  . HIP PINNING  11/2010  . JOINT REPLACEMENT    . OTHER SURGICAL HISTORY  ~ 08/1998   repair bleeding ulcer  . SHOULDER ARTHROSCOPY WITH ROTATOR CUFF REPAIR AND SUBACROMIAL DECOMPRESSION Right 05/26/2017   Procedure: SHOULDER ARTHROSCOPY WITH ROTATOR CUFF REPAIR AND SUBACROMIAL DECOMPRESSION;  Surgeon: Tania Ade, MD;  Location: [place];  Service: Orthopedics;  Laterality: Right;  SHOULDER ARTHROSCOPY WITH ROTATOR CUFF REPAIR AND SUBACROMIAL DECOMPRESSION  . TOTAL HIP ARTHROPLASTY  10/06/11   left  . TOTAL HIP ARTHROPLASTY  10/06/2011   Procedure: TOTAL HIP ARTHROPLASTY;  Surgeon: Kerin Salen;  Location: [place];  Service: Orthopedics;  Laterality: Left;    Current Medications: Current Meds  Medication Sig  . allopurinol (ZYLOPRIM) 100 MG tablet Take 0.5 tablets (50 mg total)  by mouth every Monday, Wednesday, and Friday.  . ferrous sulfate 325 (65 FE) MG tablet Take 1 tablet (325 mg total) by mouth daily with breakfast.  . furosemide (LASIX) 40 MG tablet Take 1 tablet (40 mg total) by mouth 2 (two) times daily.  . metoprolol succinate (TOPROL-XL) 100 MG 24 hr tablet Take 1 tablet (100 mg total) by mouth daily. Take with or immediately following a meal.  . Multiple Vitamin (MULTIVITAMIN WITH MINERALS) TABS tablet Take 1 tablet by mouth daily after breakfast.  . polyethylene  glycol (MIRALAX / GLYCOLAX) 17 g packet Take 17 g by mouth 2 (two) times daily as needed for mild constipation.  . sevelamer carbonate (RENVELA) 800 MG tablet Take 1 tablet (800 mg total) by mouth 3 (three) times daily with meals.  . sodium bicarbonate 650 MG tablet Take 1 tablet (650 mg total) by mouth 3 (three) times daily.  . vitamin B-12 (CYANOCOBALAMIN) 1000 MCG tablet Take 1,000 mcg by mouth daily.     Allergies:   Penicillins, Atorvastatin, Penicillin g, and Rosuvastatin   Social History   Tobacco Use  . Smoking status: Current Some Day Smoker    Packs/day: 0.25    Years: 32.00    Pack years: 8.00    Types: Cigarettes  . Smokeless tobacco: Former Systems developer    Types: Chew    Quit date: 25  . Tobacco comment: smoking cessation consult entered  Vaping Use  . Vaping Use: Never used  Substance Use Topics  . Alcohol use: Yes    Alcohol/week: 5.0 standard drinks    Types: 5 Cans of beer per week    Comment: occasional  . Drug use: No     Family History: The patient's family history includes Atrial fibrillation in his mother; Cancer in his father and mother; Hypertension in his mother.  ROS:   Please see the history of present illness.    All other systems reviewed and are negative.  EKGs/Labs/Other Studies Reviewed:    The following studies were reviewed today:  EKG:  Atrial fibrillation, rate 92, poor R wave progression.    Recent Labs: 07/28/2020: ALT 17 10/08/2020: TSH 1.804 10/16/2020: BUN 120; Creatinine, Ser 4.48; Hemoglobin 8.5; Magnesium 1.8; Platelets 181; Potassium 3.9; Sodium 137  Recent Lipid Panel No results found for: CHOL, TRIG, HDL, CHOLHDL, VLDL, LDLCALC, LDLDIRECT  Physical Exam:    VS:  BP 115/75 (BP Location: Right Arm, Patient Position: Sitting)   Pulse 92   Ht '5\' 9"'$  (1.753 m)   Wt 254 lb 12.8 oz (115.6 kg)   SpO2 99%   BMI 37.63 kg/m     Wt Readings from Last 5 Encounters:  12/05/20 254 lb 12.8 oz (115.6 kg)  10/29/20 252 lb (114.3 kg)   10/27/20 252 lb (114.3 kg)  10/21/20 254 lb 4.8 oz (115.3 kg)  10/17/20 254 lb 4.8 oz (115.3 kg)    Constitutional: No acute distress Eyes: sclera non-icteric, normal conjunctiva and lids Cardiovascular: irregular rhythm, normal rate, no murmurs. S1 and S2 normal. Radial pulses normal bilaterally. No jugular venous distention.  Respiratory: clear to auscultation bilaterally GI : normal bowel sounds, soft and nontender. No distention.   MSK: extremities warm, well perfused. Mild bilateral LE edema. L forearm fistula.  NEURO: grossly nonfocal exam, moves all extremities. PSYCH: alert and oriented x 3, normal mood and affect.   ASSESSMENT:    1. Atrial flutter, unspecified type ([place])   2. Persistent atrial fibrillation ([place])   3. CKD (chronic  kidney disease) stage 5, GFR less than 15 ml/min (HCC)   4. Essential hypertension    PLAN:    Atrial flutter, unspecified type ([place]) - Plan: EKG 12-Lead Persistent atrial fibrillation ([place]) - Plan: apixaban (ELIQUIS) 5 MG TABS tablet - would resume eliquis for now given attempt to cardiovert upcoming. With renal disease and history of bleeding ulcer requiring transfusion, may be difficult to anticoagulate ongoing. Will follow closely. Consider referral to Afib clinic. Will see him back in 1 mo to ensure still in afib and compliance with AC. - continue metoprolol succinate. Rates well controlled. - This patients CHA2DS2-VASc Score and unadjusted Ischemic Stroke Rate (% per year) is equal to 0.6 % stroke rate/year from a score of 1 Above score calculated as 1 point each if present [CHF, HTN, DM, Vascular=MI/PAD/Aortic Plaque, Age if 65-74, or Male] Above score calculated as 2 points each if present [Age > 75, or Stroke/TIA/TE]  Essential hypertension BP well controlled today. Continue current regimen.  CKD (chronic kidney disease) stage 5, GFR less than 15 ml/min (HCC) - per nephrology, still makes urine, follows closely.     Total time of  encounter: 30 minutes total time of encounter, including 20 minutes spent in face-to-face patient care on the date of this encounter. This time includes coordination of care and counseling regarding above mentioned problem list. Remainder of non-face-to-face time involved reviewing chart documents/testing relevant to the patient encounter and documentation in the medical record. I have independently reviewed documentation from referring provider.   Cherlynn Kaiser, MD [place]  [facility]    Medication Adjustments/Labs and Tests Ordered: Current medicines are reviewed at length with the patient today.  Concerns regarding medicines are outlined above.   Orders Placed This Encounter  Procedures  . EKG 12-Lead   Meds ordered this encounter  Medications  . apixaban (ELIQUIS) 5 MG TABS tablet    Sig: Take 1 tablet (5 mg total) by mouth 2 (two) times daily.    Dispense:  60 tablet    Refill:  0    Patient Instructions  Medication Instructions:  PLEASE RESTART ELIQUIS '5mg'$  TWICE DAILY  *If you need a refill on your cardiac medications before your next appointment, please call your pharmacy*  Follow-Up: At [facility], you and your health needs are our priority.  As part of our continuing mission to provide you with exceptional heart care, we have created designated Provider Care Teams.  These Care Teams include your primary Cardiologist (physician) and Advanced Practice Providers (APPs -  Physician Assistants and Nurse Practitioners) who all work together to provide you with the care you need, when you need it.  Your next appointment:   1 month(s)  The format for your next appointment:   In Person  Provider:   Cherlynn Kaiser, MD or APP

## 2020-12-05 NOTE — Patient Instructions (Signed)
Medication Instructions:  PLEASE RESTART ELIQUIS '5mg'$  TWICE DAILY  *If you need a refill on your cardiac medications before your next appointment, please call your pharmacy*  Follow-Up: At [facility], you and your health needs are our priority.  As part of our continuing mission to provide you with exceptional heart care, we have created designated Provider Care Teams.  These Care Teams include your primary Cardiologist (physician) and Advanced Practice Providers (APPs -  Physician Assistants and Nurse Practitioners) who all work together to provide you with the care you need, when you need it.  Your next appointment:   1 month(s)  The format for your next appointment:   In Person  Provider:   Cherlynn Kaiser, MD or APP

## 2020-12-14 ENCOUNTER — Encounter (HOSPITAL_COMMUNITY): Payer: Self-pay | Admitting: Emergency Medicine

## 2020-12-14 ENCOUNTER — Inpatient Hospital Stay (HOSPITAL_COMMUNITY)
Admission: EM | Admit: 2020-12-14 | Discharge: 2020-12-23 | DRG: 291 | Disposition: A | Discharge status: Home Health | Payer: 59 | Attending: Internal Medicine | Admitting: Internal Medicine

## 2020-12-14 ENCOUNTER — Other Ambulatory Visit: Payer: Self-pay

## 2020-12-14 ENCOUNTER — Emergency Department (HOSPITAL_COMMUNITY): Payer: 59

## 2020-12-14 DIAGNOSIS — E1122 Type 2 diabetes mellitus with diabetic chronic kidney disease: Secondary | ICD-10-CM | POA: Diagnosis present

## 2020-12-14 DIAGNOSIS — M898X9 Other specified disorders of bone, unspecified site: Secondary | ICD-10-CM | POA: Diagnosis present

## 2020-12-14 DIAGNOSIS — E669 Obesity, unspecified: Secondary | ICD-10-CM | POA: Diagnosis present

## 2020-12-14 DIAGNOSIS — Z880 Allergy status to penicillin: Secondary | ICD-10-CM

## 2020-12-14 DIAGNOSIS — J9601 Acute respiratory failure with hypoxia: Secondary | ICD-10-CM | POA: Diagnosis present

## 2020-12-14 DIAGNOSIS — F17210 Nicotine dependence, cigarettes, uncomplicated: Secondary | ICD-10-CM | POA: Diagnosis present

## 2020-12-14 DIAGNOSIS — Z22322 Carrier or suspected carrier of Methicillin resistant Staphylococcus aureus: Secondary | ICD-10-CM

## 2020-12-14 DIAGNOSIS — M109 Gout, unspecified: Secondary | ICD-10-CM | POA: Diagnosis present

## 2020-12-14 DIAGNOSIS — I10 Essential (primary) hypertension: Secondary | ICD-10-CM | POA: Diagnosis present

## 2020-12-14 DIAGNOSIS — Z8249 Family history of ischemic heart disease and other diseases of the circulatory system: Secondary | ICD-10-CM

## 2020-12-14 DIAGNOSIS — I959 Hypotension, unspecified: Secondary | ICD-10-CM | POA: Diagnosis present

## 2020-12-14 DIAGNOSIS — D638 Anemia in other chronic diseases classified elsewhere: Secondary | ICD-10-CM | POA: Diagnosis present

## 2020-12-14 DIAGNOSIS — N401 Enlarged prostate with lower urinary tract symptoms: Secondary | ICD-10-CM | POA: Diagnosis present

## 2020-12-14 DIAGNOSIS — Z96642 Presence of left artificial hip joint: Secondary | ICD-10-CM | POA: Diagnosis present

## 2020-12-14 DIAGNOSIS — E871 Hypo-osmolality and hyponatremia: Secondary | ICD-10-CM | POA: Diagnosis present

## 2020-12-14 DIAGNOSIS — I130 Hypertensive heart and chronic kidney disease with heart failure and stage 1 through stage 4 chronic kidney disease, or unspecified chronic kidney disease: Principal | ICD-10-CM | POA: Diagnosis present

## 2020-12-14 DIAGNOSIS — Z79899 Other long term (current) drug therapy: Secondary | ICD-10-CM

## 2020-12-14 DIAGNOSIS — Z20822 Contact with and (suspected) exposure to covid-19: Secondary | ICD-10-CM | POA: Diagnosis present

## 2020-12-14 DIAGNOSIS — D6859 Other primary thrombophilia: Secondary | ICD-10-CM | POA: Diagnosis present

## 2020-12-14 DIAGNOSIS — N179 Acute kidney failure, unspecified: Secondary | ICD-10-CM | POA: Diagnosis present

## 2020-12-14 DIAGNOSIS — D6869 Other thrombophilia: Secondary | ICD-10-CM | POA: Diagnosis present

## 2020-12-14 DIAGNOSIS — R339 Retention of urine, unspecified: Secondary | ICD-10-CM

## 2020-12-14 DIAGNOSIS — Z6839 Body mass index (BMI) 39.0-39.9, adult: Secondary | ICD-10-CM

## 2020-12-14 DIAGNOSIS — I509 Heart failure, unspecified: Secondary | ICD-10-CM

## 2020-12-14 DIAGNOSIS — I4892 Unspecified atrial flutter: Secondary | ICD-10-CM | POA: Diagnosis present

## 2020-12-14 DIAGNOSIS — D631 Anemia in chronic kidney disease: Secondary | ICD-10-CM | POA: Diagnosis present

## 2020-12-14 DIAGNOSIS — N2581 Secondary hyperparathyroidism of renal origin: Secondary | ICD-10-CM | POA: Diagnosis present

## 2020-12-14 DIAGNOSIS — I5033 Acute on chronic diastolic (congestive) heart failure: Secondary | ICD-10-CM | POA: Diagnosis present

## 2020-12-14 DIAGNOSIS — Z809 Family history of malignant neoplasm, unspecified: Secondary | ICD-10-CM

## 2020-12-14 DIAGNOSIS — E119 Type 2 diabetes mellitus without complications: Secondary | ICD-10-CM

## 2020-12-14 DIAGNOSIS — G9341 Metabolic encephalopathy: Secondary | ICD-10-CM | POA: Diagnosis present

## 2020-12-14 DIAGNOSIS — R338 Other retention of urine: Secondary | ICD-10-CM | POA: Diagnosis present

## 2020-12-14 DIAGNOSIS — N189 Chronic kidney disease, unspecified: Secondary | ICD-10-CM | POA: Diagnosis present

## 2020-12-14 DIAGNOSIS — N184 Chronic kidney disease, stage 4 (severe): Secondary | ICD-10-CM | POA: Diagnosis present

## 2020-12-14 DIAGNOSIS — Z7901 Long term (current) use of anticoagulants: Secondary | ICD-10-CM

## 2020-12-14 LAB — CBC
HCT: 29.6 % — ABNORMAL LOW (ref 39.0–52.0)
Hemoglobin: 9.2 g/dL — ABNORMAL LOW (ref 13.0–17.0)
MCH: 32.1 pg (ref 26.0–34.0)
MCHC: 31.1 g/dL (ref 30.0–36.0)
MCV: 103.1 fL — ABNORMAL HIGH (ref 80.0–100.0)
Platelets: 161 10*3/uL (ref 150–400)
RBC: 2.87 MIL/uL — ABNORMAL LOW (ref 4.22–5.81)
RDW: 16.6 % — ABNORMAL HIGH (ref 11.5–15.5)
WBC: 9.2 10*3/uL (ref 4.0–10.5)
nRBC: 0.2 % (ref 0.0–0.2)

## 2020-12-14 LAB — BASIC METABOLIC PANEL
Anion gap: 17 — ABNORMAL HIGH (ref 5–15)
BUN: 99 mg/dL — ABNORMAL HIGH (ref 8–23)
CO2: 24 mmol/L (ref 22–32)
Calcium: 10.6 mg/dL — ABNORMAL HIGH (ref 8.9–10.3)
Chloride: 91 mmol/L — ABNORMAL LOW (ref 98–111)
Creatinine, Ser: 5.49 mg/dL — ABNORMAL HIGH (ref 0.61–1.24)
GFR, Estimated: 11 mL/min — ABNORMAL LOW (ref >60)
Glucose, Bld: 102 mg/dL — ABNORMAL HIGH (ref 70–99)
Potassium: 4.6 mmol/L (ref 3.5–5.1)
Sodium: 132 mmol/L — ABNORMAL LOW (ref 135–145)

## 2020-12-14 LAB — TROPONIN I (HIGH SENSITIVITY)
Troponin I (High Sensitivity): 37 ng/L — ABNORMAL HIGH (ref <18)
Troponin I (High Sensitivity): 34 ng/L — ABNORMAL HIGH (ref <18)

## 2020-12-14 LAB — BRAIN NATRIURETIC PEPTIDE: B Natriuretic Peptide: 1930.2 pg/mL — ABNORMAL HIGH (ref 0.0–100.0)

## 2020-12-14 LAB — SARS CORONAVIRUS 2 (TAT 6-24 HRS): SARS Coronavirus 2: NEGATIVE

## 2020-12-14 MED ORDER — ONDANSETRON HCL 4 MG PO TABS: 4.0000 mg | ORAL_TABLET | Freq: Four times a day (QID) | ORAL | Status: DC | PRN

## 2020-12-14 MED ORDER — ACETAMINOPHEN 325 MG PO TABS: 650.0000 mg | ORAL_TABLET | Freq: Four times a day (QID) | ORAL | Status: DC | PRN

## 2020-12-14 MED ORDER — FUROSEMIDE 10 MG/ML IJ SOLN
40.0000 mg | Freq: Two times a day (BID) | INTRAMUSCULAR | Status: DC
  Administered 2020-12-14 – 2020-12-16 (×5): 40 mg via INTRAVENOUS
  Filled 2020-12-14 (×5): qty 4

## 2020-12-14 MED ORDER — METOPROLOL SUCCINATE ER 100 MG PO TB24
100.0000 mg | ORAL_TABLET | Freq: Every day | ORAL | Status: DC
  Administered 2020-12-15 – 2020-12-16 (×2): 100 mg via ORAL
  Filled 2020-12-14 (×2): qty 1

## 2020-12-14 MED ORDER — ONDANSETRON HCL 4 MG/2ML IJ SOLN: 4.0000 mg | Freq: Four times a day (QID) | INTRAMUSCULAR | Status: DC | PRN

## 2020-12-14 MED ORDER — ACETAMINOPHEN 650 MG RE SUPP: 650.0000 mg | Freq: Four times a day (QID) | RECTAL | Status: DC | PRN

## 2020-12-14 MED ORDER — FUROSEMIDE 10 MG/ML IJ SOLN
80.0000 mg | Freq: Once | INTRAMUSCULAR | Status: AC
  Administered 2020-12-14: 80 mg via INTRAVENOUS
  Filled 2020-12-14: qty 8

## 2020-12-14 MED ORDER — SODIUM BICARBONATE 650 MG PO TABS
650.0000 mg | ORAL_TABLET | Freq: Three times a day (TID) | ORAL | Status: DC
Start: 2020-12-14 — End: 2020-12-16
  Administered 2020-12-14 – 2020-12-16 (×5): 650 mg via ORAL
  Filled 2020-12-14 (×6): qty 1

## 2020-12-14 MED ORDER — APIXABAN 2.5 MG PO TABS
2.5000 mg | ORAL_TABLET | Freq: Two times a day (BID) | ORAL | Status: DC
  Administered 2020-12-14 – 2020-12-19 (×10): 2.5 mg via ORAL
  Filled 2020-12-14 (×11): qty 1

## 2020-12-14 MED ORDER — SEVELAMER CARBONATE 800 MG PO TABS
800.0000 mg | ORAL_TABLET | Freq: Three times a day (TID) | ORAL | Status: DC
  Administered 2020-12-15 – 2020-12-23 (×22): 800 mg via ORAL
  Filled 2020-12-14 (×25): qty 1

## 2020-12-14 NOTE — H&P (Signed)
History and Physical    Mark [name] [id number] DOB: [birth year] DOA: 12/14/2020  PCP: Mark Rend, MD  Patient coming from: Home via EMS  I have personally briefly reviewed patient's old medical records in [place]  Chief Complaint: Shortness of breath, orthopnea, edema  HPI: SOSUKE [name] is a 60 y.o. male with medical history significant for chronic atrial flutter on Eliquis, CKD stage V, chronic diastolic CHF (EF [id number] by TTE 10/09/2020), anemia of chronic kidney disease, diet-controlled type 2 diabetes, recurrent left hip dislocation who presents to the ED for evaluation of dyspnea.  Patient reports 1 week of progressive shortness of breath, dyspnea on exertion, orthopnea, and lower extremity swelling.  He has been taking his home Lasix with adequate to slightly diminished urine output.  He has not been able to sleep well the last 3 days therefore came to the ED for further evaluation and management.  He denies any chest pain, palpitations, nausea, vomiting, abdominal pain, or dysuria.  ED Course:  Initial vitals showed BP 106/81, pulse 78, RR 17, temp 97.4 degrees Fahrenheit, SPO2 97% on room air.  Labs show BUN 99, creatinine 5.49 (previously 4.48 on 10/16/2020), sodium 132, potassium 4.6, bicarb 24, serum glucose 102, calcium 10.6, WBC 9.2, hemoglobin 9.2, platelets 161,000, high-sensitivity troponin I 37 > 34, BNP 1930.2.  SARS-CoV-2 PCR obtained and pending.  2 view chest x-ray shows cardiomegaly with elevation of left hemidiaphragm and streaky airspace opacities in the lingula.  Patient was given IV Lasix 80 mg once and the hospitalist service was consulted to admit for further evaluation and management.  Review of Systems: All systems reviewed and are negative except as documented in history of present illness above.   Past Medical History:  Diagnosis Date  . Anemia    labs today indicate (09/27/11)  . Arthritis    right knee  . Bleeding ulcer    hx  of, required 6 units blood  . Blood transfusion    6 units with ulcer repair surgery  . Chronic kidney disease    stage IV, has fistula, never started diaylsis  . Complication of anesthesia    little slow to wake up with last surgery  . Dyspnea    with exertion  . Gout   . Hip dislocation, left ([place]) 10/09/2020  . Hypertension   . Osteoporosis   . Pneumonia     Past Surgical History:  Procedure Laterality Date  . AV FISTULA PLACEMENT Left 09/07/2016   Procedure: ARTERIOVENOUS (AV) FISTULA CREATION LEFT ARM;  Surgeon: Angelia Mould, MD;  Location: [place];  Service: Vascular;  Laterality: Left;  . COLONOSCOPY    . EXTERNAL EAR SURGERY    . HIP CLOSED REDUCTION Left 07/27/2020   Procedure: CLOSED REDUCTION HIP;  Surgeon: Marchia Bond, MD;  Location: [place];  Service: Orthopedics;  Laterality: Left;  . HIP PINNING  11/2010  . JOINT REPLACEMENT    . OTHER SURGICAL HISTORY  ~ 08/1998   repair bleeding ulcer  . SHOULDER ARTHROSCOPY WITH ROTATOR CUFF REPAIR AND SUBACROMIAL DECOMPRESSION Right 05/26/2017   Procedure: SHOULDER ARTHROSCOPY WITH ROTATOR CUFF REPAIR AND SUBACROMIAL DECOMPRESSION;  Surgeon: Tania Ade, MD;  Location: [place];  Service: Orthopedics;  Laterality: Right;  SHOULDER ARTHROSCOPY WITH ROTATOR CUFF REPAIR AND SUBACROMIAL DECOMPRESSION  . TOTAL HIP ARTHROPLASTY  10/06/11   left  . TOTAL HIP ARTHROPLASTY  10/06/2011   Procedure: TOTAL HIP ARTHROPLASTY;  Surgeon: Kerin Salen;  [facility];  Service:  Orthopedics;  Laterality: Left;    Social History:  reports that he has been smoking cigarettes. He has a 8.00 pack-year smoking history. He quit smokeless tobacco use about 47 years ago.  His smokeless tobacco use included chew. He reports current alcohol use of about 5.0 standard drinks of alcohol per week. He reports that he does not use drugs.  Allergies  Allergen Reactions  . Penicillins Swelling, Rash and Other (See Comments)    Arms and eyes swell &  skin breaks out  Has patient had a PCN reaction causing immediate rash, facial/tongue/throat swelling, SOB or lightheadedness with hypotension: Yes Has patient had a PCN reaction causing severe rash involving mucus membranes or skin necrosis: Yes Has patient had a PCN reaction that required hospitalization: No Has patient had a PCN reaction occurring within the last 10 years: No If all of the above answers are "NO", then may proceed with Cephalosporin use.   . Atorvastatin     Other reaction(s): Unknown  . Penicillin G     Unknown  . Rosuvastatin     Other reaction(s): Unknown    Family History  Problem Relation Age of Onset  . Cancer Mother   . Atrial fibrillation Mother   . Hypertension Mother   . Cancer Father      Prior to Admission medications   Medication Sig Start Date End Date Taking? Authorizing Provider  apixaban (ELIQUIS) 5 MG TABS tablet Take 1 tablet (5 mg total) by mouth 2 (two) times daily. 12/05/20  Yes Mark Munroe, MD  ferrous sulfate 325 (65 FE) MG tablet Take 1 tablet (325 mg total) by mouth daily with breakfast. 08/18/20  Yes Medina-Vargas, Monina C, NP  furosemide (LASIX) 40 MG tablet Take 1 tablet (40 mg total) by mouth 2 (two) times daily. 10/29/20  Yes Medina-Vargas, Monina C, NP  loratadine (CLARITIN) 10 MG tablet Take 10 mg by mouth daily.   Yes [provider]  metoprolol succinate (TOPROL-XL) 100 MG 24 hr tablet Take 1 tablet (100 mg total) by mouth daily. Take with or immediately following a meal. 10/29/20  Yes Medina-Vargas, Monina C, NP  Multiple Vitamin (MULTIVITAMIN WITH MINERALS) TABS tablet Take 1 tablet by mouth daily after breakfast.   Yes [provider]  sevelamer carbonate (RENVELA) 800 MG tablet Take 1 tablet (800 mg total) by mouth 3 (three) times daily with meals. 10/29/20  Yes Medina-Vargas, Monina C, NP  sodium bicarbonate 650 MG tablet Take 1 tablet (650 mg total) by mouth 3 (three) times daily. 10/29/20  Yes  Medina-Vargas, Monina C, NP  vitamin B-12 (CYANOCOBALAMIN) 1000 MCG tablet Take 1,000 mcg by mouth daily.   Yes [provider]  allopurinol (ZYLOPRIM) 100 MG tablet Take 0.5 tablets (50 mg total) by mouth every Monday, Wednesday, and Friday. 10/29/20   Medina-Vargas, Monina C, NP  polyethylene glycol (MIRALAX / GLYCOLAX) 17 g packet Take 17 g by mouth 2 (two) times daily as needed for mild constipation. 10/15/20   Mercy Riding, MD    Physical Exam: Vitals:   12/14/20 1459 12/14/20 1630 12/14/20 1700 12/14/20 1715  BP: 105/68 102/80 108/76 104/82  Pulse: 97  98 91  Resp: (!) '23 17 20 '$ (!) 23  Temp:      SpO2: 99%  100% 100%   Constitutional: Obese man resting in bed with head elevated, NAD, calm, comfortable Eyes: PERRL, lids and conjunctivae normal ENMT: Mucous membranes are moist. Posterior pharynx clear of any exudate or lesions.Normal dentition.  Neck: normal, supple, no masses. Respiratory: Bibasilar inspiratory crackles.  Normal respiratory effort. No accessory muscle use.  Cardiovascular: Regular rate and rhythm, systolic flow murmur present.  +2 bilateral lower extremity edema. 2+ pedal pulses.  aVF left wrist with palpable thrill. Abdomen: no tenderness, no masses palpated. No hepatosplenomegaly. Bowel sounds positive.  Musculoskeletal: no clubbing / cyanosis. No joint deformity upper and lower extremities. Good ROM, no contractures. Normal muscle tone.  Skin: no rashes, lesions, ulcers. No induration Neurologic: CN 2-12 grossly intact. Sensation intact. Strength 5/5 in all 4.  Psychiatric: Normal judgment and insight. Alert and oriented x 3. Normal mood.   Labs on Admission: I have personally reviewed following labs and imaging studies  CBC: Recent Labs  Lab 12/14/20 1431  WBC 9.2  HGB 9.2*  HCT 29.6*  MCV 103.1*  PLT [id number]   Basic Metabolic Panel: Recent Labs  Lab 12/14/20 1431  NA 132*  K 4.6  CL 91*  CO2 24  GLUCOSE 102*  BUN 99*  CREATININE 5.49*   CALCIUM 10.6*   GFR: Estimated Creatinine Clearance: 17.7 mL/min (A) (by C-G formula based on SCr of 5.49 mg/dL (H)). Liver Function Tests: No results for input(s): AST, ALT, ALKPHOS, BILITOT, PROT, ALBUMIN in the last 168 hours. No results for input(s): LIPASE, AMYLASE in the last 168 hours. No results for input(s): AMMONIA in the last 168 hours. Coagulation Profile: No results for input(s): INR, PROTIME in the last 168 hours. Cardiac Enzymes: No results for input(s): CKTOTAL, CKMB, CKMBINDEX, TROPONINI in the last 168 hours. BNP (last 3 results) No results for input(s): PROBNP in the last 8760 hours. HbA1C: No results for input(s): HGBA1C in the last 72 hours. CBG: No results for input(s): GLUCAP in the last 168 hours. Lipid Profile: No results for input(s): CHOL, HDL, LDLCALC, TRIG, CHOLHDL, LDLDIRECT in the last 72 hours. Thyroid Function Tests: No results for input(s): TSH, T4TOTAL, FREET4, T3FREE, THYROIDAB in the last 72 hours. Anemia Panel: No results for input(s): VITAMINB12, FOLATE, FERRITIN, TIBC, IRON, RETICCTPCT in the last 72 hours. Urine analysis:    Component Value Date/Time   COLORURINE YELLOW 07/26/2020 [place] 07/26/2020 1244   LABSPEC 1.012 07/26/2020 1244   PHURINE 5.0 07/26/2020 1244   GLUCOSEU NEGATIVE 07/26/2020 1244   HGBUR MODERATE (A) 07/26/2020 1244   BILIRUBINUR NEGATIVE 07/26/2020 [place] 07/26/2020 1244   PROTEINUR 100 (A) 07/26/2020 1244   UROBILINOGEN 0.2 09/27/2011 1317   NITRITE NEGATIVE 07/26/2020 1244   LEUKOCYTESUR NEGATIVE 07/26/2020 1244    Radiological Exams on Admission: DG Chest 2 View  Result Date: 12/14/2020 CLINICAL DATA:  Shortness of breath on exertion. EXAM: CHEST - 2 VIEW COMPARISON:  October 09, 2020 FINDINGS: Cardiac silhouette is enlarged.  Mediastinal contours appear intact. Elevation of the left hemidiaphragm. Streaky airspace opacities in the lingula. Osseous structures are without  acute abnormality. Soft tissues are grossly normal. IMPRESSION: Streaky airspace opacities in the lingula, may represent atelectasis or peribronchial airspace consolidation. Enlarged cardiac silhouette. Electronically Signed   By: Fidela Salisbury M.D.   On: 12/14/2020 15:52    EKG: Personally reviewed. Atrial flutter, rate 95  Assessment/Plan Principal Problem:   Acute on chronic diastolic (congestive) heart failure (HCC) Active Problems:   Acute renal failure superimposed on chronic kidney disease (HCC)   Atrial flutter (HCC)  BANNON MIDYETTE is a 60 y.o. male with medical history significant for chronic atrial flutter on Eliquis, CKD stage V, chronic diastolic CHF (EF  60-65% by TTE 10/09/2020), anemia of chronic kidney disease, diet-controlled type 2 diabetes, recurrent left hip dislocation who is admitted with acute on chronic diastolic CHF exacerbation.  Acute on chronic diastolic CHF exacerbation: Presenting with 1 week progressive shortness of breath, DOE, orthopnea, peripheral edema and elevated BNP on arrival consistent with acute on chronic CHF exacerbation.  He was given IV Lasix 80 mg once in the ED. -Continue with IV Lasix 40 mg twice daily -Monitor I/O's and daily weights -Supplement O2 if needed  AKI on CKD stage V: Creatinine elevated compared to recent baseline, suspect related to hypervolemia.  Continue Lasix diuresis as above and monitor closely.  Patient has a left wrist aVF placed in 2017 but has not required initiation of dialysis.  Chronic atrial flutter: Remains in atrial flutter with controlled rate.  Will reduce Eliquis to 2.5 mg twice daily given degree of renal dysfunction.  Continue Toprol-XL.  Anemia of chronic kidney disease: Chronic and stable.  Patient reported seeing some minor bleeding several days ago therefore he held his Eliquis.  Reducing Eliquis dose to 2.5 mg twice daily as above.  Continue to monitor.  DVT prophylaxis: Eliquis Code Status:  Full code, confirmed with patient Family Communication: Discussed with patient, he has discussed with family Disposition Plan: From home and likely discharge to home pending adequate diuresis  Consults called: None Level of care: Telemetry Cardiac Admission status:  Status is: Observation  The patient remains OBS appropriate and will d/c before 2 midnights.  Dispo: The patient is from: Home              Anticipated d/c is to: Home              Anticipated d/c date is: 1 day              Patient currently is not medically stable to d/c.   Difficult to place patient No    Zada Finders MD [facility]  If 7PM-7AM, please contact night-coverage [email or web address]  12/14/2020, 6:39 PM

## 2020-12-14 NOTE — ED Provider Notes (Signed)
[facility] EMERGENCY DEPARTMENT Provider Note   CSN: [id number] Arrival date & time: 12/14/20  1339     History Chief Complaint  Patient presents with  . Shortness of Breath    Mark [name] is a 60 y.o. male.  The history is provided by the patient.  Shortness of Breath Severity:  Severe Onset quality:  Gradual Timing:  Constant Progression:  Worsening Chronicity:  Recurrent Context: activity   Relieved by:  Rest and sitting up Worsened by:  Activity Ineffective treatments:  Rest and diuretics Associated symptoms: cough and sputum production   Associated symptoms: no abdominal pain, no chest pain, no fever, no vomiting and no wheezing   Associated symptoms comment:  Orthopnea.  Can only walk 5 feet and severe SOB.  Worsening swelling in the lower legs over the last week and eating out more than usual.  Medication compliance.   Risk factors: obesity and tobacco use   Risk factors comment:  Ckd, htn, '80mg'$  of lasix daily, afib on eliquis      Past Medical History:  Diagnosis Date  . Anemia    labs today indicate (09/27/11)  . Arthritis    right knee  . Bleeding ulcer    hx of, required 6 units blood  . Blood transfusion    6 units with ulcer repair surgery  . Chronic kidney disease    stage IV, has fistula, never started diaylsis  . Complication of anesthesia    little slow to wake up with last surgery  . Dyspnea    with exertion  . Gout   . Hip dislocation, left ([place]) 10/09/2020  . Hypertension   . Osteoporosis   . Pneumonia     Patient Active Problem List   Diagnosis Date Noted  . Acute on chronic diastolic CHF (congestive heart failure) ([place]) 10/21/2020  . Atrial flutter ([place]) 10/08/2020  . Atrial flutter with rapid ventricular response ([place]) 07/28/2020  . Sepsis ([place]) 07/26/2020  . Closed dislocation of left hip ([place]) 07/26/2020  . CKD (chronic kidney disease), stage IV ([place]) 07/26/2020  . Gout 07/26/2020  . Pneumonia  07/26/2020  . Diverticula of colon 04/29/2020  . Acute renal failure superimposed on chronic kidney disease ([place]) 04/29/2020  . Diverticulitis   . Sinus tachycardia 10/21/2015  . DM (diabetes mellitus) ([place]) 10/09/2011  . Benign essential HTN 10/09/2011  . Post-traumatic osteoarthritis of left hip 10/06/2011    Past Surgical History:  Procedure Laterality Date  . AV FISTULA PLACEMENT Left 09/07/2016   Procedure: ARTERIOVENOUS (AV) FISTULA CREATION LEFT ARM;  Surgeon: Angelia Mould, MD;  Location: [facility];  Service: Vascular;  Laterality: Left;  . COLONOSCOPY    . EXTERNAL EAR SURGERY    . HIP CLOSED REDUCTION Left 07/27/2020   Procedure: CLOSED REDUCTION HIP;  Surgeon: Marchia Bond, MD;  Location: [facility];  Service: Orthopedics;  Laterality: Left;  . HIP PINNING  11/2010  . JOINT REPLACEMENT    . OTHER SURGICAL HISTORY  ~ 08/1998   repair bleeding ulcer  . SHOULDER ARTHROSCOPY WITH ROTATOR CUFF REPAIR AND SUBACROMIAL DECOMPRESSION Right 05/26/2017   Procedure: SHOULDER ARTHROSCOPY WITH ROTATOR CUFF REPAIR AND SUBACROMIAL DECOMPRESSION;  Surgeon: Tania Ade, MD;  Location: [facility];  Service: Orthopedics;  Laterality: Right;  SHOULDER ARTHROSCOPY WITH ROTATOR CUFF REPAIR AND SUBACROMIAL DECOMPRESSION  . TOTAL HIP ARTHROPLASTY  10/06/11   left  . TOTAL HIP ARTHROPLASTY  10/06/2011   Procedure: TOTAL HIP ARTHROPLASTY;  Surgeon: Kerin Salen;  Location: [facility];  Service: Orthopedics;  Laterality: Left;       Family History  Problem Relation Age of Onset  . Cancer Mother   . Atrial fibrillation Mother   . Hypertension Mother   . Cancer Father     Social History   Tobacco Use  . Smoking status: Current Some Day Smoker    Packs/day: 0.25    Years: 32.00    Pack years: 8.00    Types: Cigarettes  . Smokeless tobacco: Former Systems developer    Types: Chew    Quit date: 2  . Tobacco comment: smoking cessation consult entered  Vaping Use  . Vaping Use: Never used  Substance  Use Topics  . Alcohol use: Yes    Alcohol/week: 5.0 standard drinks    Types: 5 Cans of beer per week    Comment: occasional  . Drug use: No    Home Medications Prior to Admission medications   Medication Sig Start Date End Date Taking? Authorizing Provider  allopurinol (ZYLOPRIM) 100 MG tablet Take 0.5 tablets (50 mg total) by mouth every Monday, Wednesday, and Friday. 10/29/20   Medina-Vargas, Monina C, NP  apixaban (ELIQUIS) 5 MG TABS tablet Take 1 tablet (5 mg total) by mouth 2 (two) times daily. 12/05/20   [name], MD  ferrous sulfate 325 (65 FE) MG tablet Take 1 tablet (325 mg total) by mouth daily with breakfast. 08/18/20   Medina-Vargas, Monina C, NP  furosemide (LASIX) 40 MG tablet Take 1 tablet (40 mg total) by mouth 2 (two) times daily. 10/29/20   Medina-Vargas, Monina C, NP  metoprolol succinate (TOPROL-XL) 100 MG 24 hr tablet Take 1 tablet (100 mg total) by mouth daily. Take with or immediately following a meal. 10/29/20   Medina-Vargas, Monina C, NP  Multiple Vitamin (MULTIVITAMIN WITH MINERALS) TABS tablet Take 1 tablet by mouth daily after breakfast.    [provider]  polyethylene glycol (MIRALAX / GLYCOLAX) 17 g packet Take 17 g by mouth 2 (two) times daily as needed for mild constipation. 10/15/20   Mercy Riding, MD  sevelamer carbonate (RENVELA) 800 MG tablet Take 1 tablet (800 mg total) by mouth 3 (three) times daily with meals. 10/29/20   Medina-Vargas, Monina C, NP  sodium bicarbonate 650 MG tablet Take 1 tablet (650 mg total) by mouth 3 (three) times daily. 10/29/20   Medina-Vargas, Monina C, NP  vitamin B-12 (CYANOCOBALAMIN) 1000 MCG tablet Take 1,000 mcg by mouth daily.    [provider]    Allergies    Penicillins, Atorvastatin, Penicillin g, and Rosuvastatin  Review of Systems   Review of Systems  Constitutional: Negative for fever.  Respiratory: Positive for cough, sputum production and shortness of breath. Negative for wheezing.    Cardiovascular: Negative for chest pain.  Gastrointestinal: Negative for abdominal pain and vomiting.  All other systems reviewed and are negative.   Physical Exam Updated Vital Signs BP 105/68   Pulse 97   Temp (!) 97.4 F (36.3 C)   Resp (!) 23   SpO2 99%   Physical Exam Vitals and nursing note reviewed.  Constitutional:      General: He is not in acute distress.    Appearance: He is well-developed and well-nourished.  HENT:     Head: Normocephalic and atraumatic.     Mouth/Throat:     Mouth: Oropharynx is clear and moist.  Eyes:     Extraocular Movements: EOM normal.     Conjunctiva/sclera: Conjunctivae  normal.     Pupils: Pupils are equal, round, and reactive to light.  Cardiovascular:     Rate and Rhythm: Tachycardia present. Rhythm irregularly irregular.     Pulses: Normal pulses and intact distal pulses.     Heart sounds: No murmur heard.   Pulmonary:     Effort: Tachypnea present. No respiratory distress.     Breath sounds: Examination of the right-middle field reveals rales. Examination of the left-middle field reveals rales. Examination of the right-lower field reveals decreased breath sounds. Examination of the left-lower field reveals decreased breath sounds. Decreased breath sounds and rales present. No wheezing.  Abdominal:     General: There is no distension.     Palpations: Abdomen is soft.     Tenderness: There is no abdominal tenderness. There is no guarding or rebound.  Musculoskeletal:        General: No tenderness. Normal range of motion.     Cervical back: Normal range of motion and neck supple.     Right lower leg: Edema present.     Left lower leg: Edema present.     Comments: 3+ pitting edema to the knees bilaterally  Skin:    General: Skin is warm and dry.     Findings: No erythema or rash.  Neurological:     General: No focal deficit present.     Mental Status: He is alert and oriented to person, place, and time. Mental status is at  baseline.  Psychiatric:        Mood and Affect: Mood and affect and mood normal.        Behavior: Behavior normal.        Thought Content: Thought content normal.     ED Results / Procedures / Treatments   Labs (all labs ordered are listed, but only abnormal results are displayed) Labs Reviewed  BASIC METABOLIC PANEL - Abnormal; Notable for the following components:      Result Value   Sodium 132 (*)    Chloride 91 (*)    Glucose, Bld 102 (*)    BUN 99 (*)    Creatinine, Ser 5.49 (*)    Calcium 10.6 (*)    GFR, Estimated 11 (*)    Anion gap 17 (*)    All other components within normal limits  CBC - Abnormal; Notable for the following components:   RBC 2.87 (*)    Hemoglobin 9.2 (*)    HCT 29.6 (*)    MCV 103.1 (*)    RDW 16.6 (*)    All other components within normal limits  TROPONIN I (HIGH SENSITIVITY) - Abnormal; Notable for the following components:   Troponin I (High Sensitivity) 37 (*)    All other components within normal limits  BRAIN NATRIURETIC PEPTIDE  TROPONIN I (HIGH SENSITIVITY)    EKG EKG Interpretation  Date/Time:  Sunday December 14 2020 15:00:15 EST Ventricular Rate:  95 PR Interval:    QRS Duration: 111 QT Interval:  363 QTC Calculation: 457 R Axis:   37 Text Interpretation: Atrial fibrillation RSR' in V1 or V2, probably normal variant Inferior infarct, acute (LCx) Minimal ST elevation, anterior leads Lateral leads are also involved Baseline wander in lead(s) V3 Confirmed by Blanchie Dessert [phone]) on 12/14/2020 3:05:38 PM   Radiology DG Chest 2 View  Result Date: 12/14/2020 CLINICAL DATA:  Shortness of breath on exertion. EXAM: CHEST - 2 VIEW COMPARISON:  October 09, 2020 FINDINGS: Cardiac silhouette is enlarged.  Mediastinal contours appear  intact. Elevation of the left hemidiaphragm. Streaky airspace opacities in the lingula. Osseous structures are without acute abnormality. Soft tissues are grossly normal. IMPRESSION: Streaky airspace  opacities in the lingula, may represent atelectasis or peribronchial airspace consolidation. Enlarged cardiac silhouette. Electronically Signed   By: Fidela Salisbury M.D.   On: 12/14/2020 15:52    Procedures Procedures   Medications Ordered in ED Medications - No data to display  ED Course  I have reviewed the triage vital signs and the nursing notes.  Pertinent labs & imaging results that were available during my care of the patient were reviewed by me and considered in my medical decision making (see chart for details).    MDM Rules/Calculators/A&P                          Patient is a 60 year old gentleman presenting today with a worsening shortness of breath over the last 1 week.  He is also had significant swelling in his legs.  He reports there is not been significant change in his weight but he has not weighed in about 6 days.  He has had a cough with some sputum production but denies any fever.  He is still taking 80 mg of Lasix daily but does not feel like it is helping.  He denies any chest pain dyspnea is present at rest but worse with exertion and orthopnea.  On exam patient appears fluid overloaded.  Concern for pleural effusion given decreased breath sounds in the bases and rales in the middle lobes.  Patient has a history of CKD and has a mature fistula in his left arm but he reports that has been there for years and they have never had to use it. Patient's oxygen saturation is 99% on room air but due to work of breathing he was placed on 2 L.  BMP today with mildly worsening creatinine of 5.4 from 4.5 from last check, CBC with chronic anemia with hemoglobin of 9.2 but normal white count initial troponin was 37 which is not significantly different from his baseline.  EKG showing atrial fibrillation with some mild ST changes inferiorly but patient is not describing symptoms of acute MI at this time.  BNP is pending.  Chest x-ray appears to be fluid overload.  Covid test pending.   Will give IV Lasix.  Suspect patient will need admission for CHF exacerbation.  Low suspicion for PE or pneumonia at this time.  5:51 PM Patient's troponin is flat at 34 BNP today is 1930 and suspect that is most likely the cause of his shortness of breath.  Will admit for IV diuresis.  MDM Number of Diagnoses or Management Options   Amount and/or Complexity of Data Reviewed Clinical lab tests: ordered and reviewed Tests in the radiology section of CPT: ordered and reviewed Tests in the medicine section of CPT: reviewed and ordered Decide to obtain previous medical records or to obtain history from someone other than the patient: yes Obtain history from someone other than the patient: yes Review and summarize past medical records: yes Discuss the patient with other providers: yes Independent visualization of images, tracings, or specimens: yes  Risk of Complications, Morbidity, and/or Mortality Presenting problems: high Diagnostic procedures: moderate Management options: moderate  Patient Progress Patient progress: stable   Final Clinical Impression(s) / ED Diagnoses Final diagnoses:  Acute on chronic congestive heart failure, unspecified heart failure type ([place])    Rx / DC Orders ED Discharge  Orders    None       Blanchie Dessert, MD 12/14/20 1753

## 2020-12-14 NOTE — ED Triage Notes (Signed)
Pt to triage via [facility] from home.  Reports SOB with exertion and lying flat x 5 days.  Sleeping in recliner.  Also reports swelling to feet x 1 week.  Denies chest pain.

## 2020-12-15 DIAGNOSIS — D6859 Other primary thrombophilia: Secondary | ICD-10-CM | POA: Diagnosis present

## 2020-12-15 DIAGNOSIS — I130 Hypertensive heart and chronic kidney disease with heart failure and stage 1 through stage 4 chronic kidney disease, or unspecified chronic kidney disease: Secondary | ICD-10-CM | POA: Diagnosis present

## 2020-12-15 DIAGNOSIS — D631 Anemia in chronic kidney disease: Secondary | ICD-10-CM | POA: Diagnosis present

## 2020-12-15 DIAGNOSIS — J9601 Acute respiratory failure with hypoxia: Secondary | ICD-10-CM | POA: Diagnosis present

## 2020-12-15 DIAGNOSIS — G9341 Metabolic encephalopathy: Secondary | ICD-10-CM | POA: Diagnosis present

## 2020-12-15 DIAGNOSIS — E871 Hypo-osmolality and hyponatremia: Secondary | ICD-10-CM | POA: Diagnosis present

## 2020-12-15 DIAGNOSIS — Z96642 Presence of left artificial hip joint: Secondary | ICD-10-CM | POA: Diagnosis present

## 2020-12-15 DIAGNOSIS — N179 Acute kidney failure, unspecified: Secondary | ICD-10-CM

## 2020-12-15 DIAGNOSIS — E669 Obesity, unspecified: Secondary | ICD-10-CM | POA: Diagnosis present

## 2020-12-15 DIAGNOSIS — I509 Heart failure, unspecified: Secondary | ICD-10-CM | POA: Diagnosis present

## 2020-12-15 DIAGNOSIS — Z6839 Body mass index (BMI) 39.0-39.9, adult: Secondary | ICD-10-CM | POA: Diagnosis not present

## 2020-12-15 DIAGNOSIS — I10 Essential (primary) hypertension: Secondary | ICD-10-CM | POA: Diagnosis not present

## 2020-12-15 DIAGNOSIS — D638 Anemia in other chronic diseases classified elsewhere: Secondary | ICD-10-CM | POA: Diagnosis present

## 2020-12-15 DIAGNOSIS — I4892 Unspecified atrial flutter: Secondary | ICD-10-CM | POA: Diagnosis present

## 2020-12-15 DIAGNOSIS — Z7901 Long term (current) use of anticoagulants: Secondary | ICD-10-CM | POA: Diagnosis not present

## 2020-12-15 DIAGNOSIS — Z20822 Contact with and (suspected) exposure to covid-19: Secondary | ICD-10-CM | POA: Diagnosis present

## 2020-12-15 DIAGNOSIS — Z22322 Carrier or suspected carrier of Methicillin resistant Staphylococcus aureus: Secondary | ICD-10-CM | POA: Diagnosis not present

## 2020-12-15 DIAGNOSIS — N401 Enlarged prostate with lower urinary tract symptoms: Secondary | ICD-10-CM | POA: Diagnosis present

## 2020-12-15 DIAGNOSIS — F17210 Nicotine dependence, cigarettes, uncomplicated: Secondary | ICD-10-CM | POA: Diagnosis present

## 2020-12-15 DIAGNOSIS — R338 Other retention of urine: Secondary | ICD-10-CM | POA: Diagnosis present

## 2020-12-15 DIAGNOSIS — E1122 Type 2 diabetes mellitus with diabetic chronic kidney disease: Secondary | ICD-10-CM

## 2020-12-15 DIAGNOSIS — N184 Chronic kidney disease, stage 4 (severe): Secondary | ICD-10-CM | POA: Diagnosis present

## 2020-12-15 DIAGNOSIS — M898X9 Other specified disorders of bone, unspecified site: Secondary | ICD-10-CM | POA: Diagnosis present

## 2020-12-15 DIAGNOSIS — N2581 Secondary hyperparathyroidism of renal origin: Secondary | ICD-10-CM | POA: Diagnosis present

## 2020-12-15 DIAGNOSIS — Z79899 Other long term (current) drug therapy: Secondary | ICD-10-CM | POA: Diagnosis not present

## 2020-12-15 DIAGNOSIS — I5033 Acute on chronic diastolic (congestive) heart failure: Secondary | ICD-10-CM | POA: Diagnosis present

## 2020-12-15 LAB — CBC
HCT: 30.7 % — ABNORMAL LOW (ref 39.0–52.0)
Hemoglobin: 9.7 g/dL — ABNORMAL LOW (ref 13.0–17.0)
MCH: 33.2 pg (ref 26.0–34.0)
MCHC: 31.6 g/dL (ref 30.0–36.0)
MCV: 105.1 fL — ABNORMAL HIGH (ref 80.0–100.0)
Platelets: 205 10*3/uL (ref 150–400)
RBC: 2.92 MIL/uL — ABNORMAL LOW (ref 4.22–5.81)
RDW: 16.6 % — ABNORMAL HIGH (ref 11.5–15.5)
WBC: 9.1 10*3/uL (ref 4.0–10.5)
nRBC: 0.2 % (ref 0.0–0.2)

## 2020-12-15 LAB — BASIC METABOLIC PANEL
Anion gap: 14 (ref 5–15)
BUN: 104 mg/dL — ABNORMAL HIGH (ref 8–23)
CO2: 27 mmol/L (ref 22–32)
Calcium: 10.8 mg/dL — ABNORMAL HIGH (ref 8.9–10.3)
Chloride: 92 mmol/L — ABNORMAL LOW (ref 98–111)
Creatinine, Ser: 5.52 mg/dL — ABNORMAL HIGH (ref 0.61–1.24)
GFR, Estimated: 11 mL/min — ABNORMAL LOW (ref >60)
Glucose, Bld: 86 mg/dL (ref 70–99)
Potassium: 4.5 mmol/L (ref 3.5–5.1)
Sodium: 133 mmol/L — ABNORMAL LOW (ref 135–145)

## 2020-12-15 LAB — PHOSPHORUS: Phosphorus: 7.5 mg/dL — ABNORMAL HIGH (ref 2.5–4.6)

## 2020-12-15 NOTE — ED Notes (Signed)
Transport requested for pt  

## 2020-12-15 NOTE — Progress Notes (Signed)
Progress Note    CURTEZ HUBLEY  [id number] DOB: [birth year]  DOA: 12/14/2020 PCP: Delrae Rend, MD    Brief Narrative:   Chief complaint: Shortness of breath, orthopnea, edema.  Medical records reviewed and are as summarized below:  JENESIS HOT is an 60 y.o. male with a PMH of chronic atrial flutter on Eliquis, CKD stage V, chronic diastolic CHF (EF [id number] by TTE 10/09/2020), anemia of chronic kidney disease, diet-controlled type 2 diabetes, recurrent left hip dislocation who presents to the ED via EMS for evaluation of a 5 day h/o worsening dyspnea. In the ED, initial vitals were stable with a SPO2 of 97% on room air.Labs showed BUN 99, creatinine 5.49 (previously 4.48 on 10/16/2020), sodium 132, potassium 4.6, bicarb 24, serum glucose 102, calcium 10.6, WBC 9.2, hemoglobin 9.2, platelets 161,000, high-sensitivity troponin I 37 > 34, BNP 1930.2. 2 view chest x-ray showed cardiomegaly with elevation of left hemidiaphragm and streaky airspace opacities in the lingula. The patient was given IV Lasix 80 mg once and the hospitalist service was consulted to admit for further evaluation and management.  Assessment/Plan:   Principle Problem: Acute on chronic diastolic CHF exacerbation, POA associated with hyponatremia He was given IV Lasix 80 mg once in the ED, and started on IV Lasix 40 mg BID. Unfortunately, no I/O recorded. Tachypnea appears to be improving. Sodium slightly low, most likely due to hypervolemia in the setting of CHF. Continue to diurese. Monitor chemistries. Remains tachypneic, tachycardic and volume overloaded. ACE/ARB contraindicated due to AKI.  Active problems: AKI on CKD stage IV, POA Creatinine elevated compared to recent baseline, suspected to be related to hypervolemia.  Continue Lasix diuresis as above and monitor closely.  Patient has a left wrist aVF placed in 2017 but has not required initiation of dialysis. Creatinine essentially unchanged today. Continue  Renvela and sodium bicarbonate tabletes.  Chronic atrial flutter, POA Remains in atrial flutter with controlled rate.  Continue reduced dose Eliquis (2.5 mg) twice daily given degree of renal dysfunction.  Continue Toprol-XL.  Anemia of chronic kidney disease, chronic, POA Chronic and stable.  Patient reported seeing some minor bleeding several days ago therefore he held his Eliquis.  Reducing Eliquis dose to 2.5 mg twice daily as above.  Continue to monitor. Hgb stable.  Diet controlled DM with renal complications, POA Chronic, controlled.  Hypercalcemia, acute, POA Likely from secondary hyperparathyroidism related to CKD. Check phosphorus levels.  May need to be on a binder.  Benign essential HTN, chronic, POA BP controlled.  Nutritional status        There is no height or weight on file to calculate BMI.   Family Communication/Anticipated D/C date and plan/Code Status   DVT prophylaxis: apixaban (ELIQUIS) tablet 2.5 mg Start: 12/14/20 2200 apixaban (ELIQUIS) tablet 2.5 mg   Current Level of Care:: Telemetry Cardiac Code Status: Full Code.  Family Communication: Brother updated by phone. Disposition Plan: Status is: Inpatient  The patient will require care spanning > 2 midnights and should be moved to inpatient because: Inpatient level of care appropriate due to severity of illness, volume overloaded, dyspneic and still requiring IV lasix in the setting of CKD/AKI.   Dispo: The patient is from: Home              Anticipated d/c is to: Home              Anticipated d/c date is: 2 days  Patient currently is not medically stable to d/c.   Difficult to place patient No    Medical Consultants:    None.   Anti-Infectives:    None  Subjective:   Very short of breath.  Gets winded with limited activity.  No chest pain. Reports worsening leg swelling over the past few weeks, and has not been able to lay flat without feeling "choked." + nausea, no  vomiting.  Objective:    Vitals:   12/15/20 0415 12/15/20 0500 12/15/20 0930 12/15/20 1024  BP: 90/76 101/82 98/80 109/81  Pulse: (!) 102 (!) 105 (!) 107 (!) 106  Resp: '20 20 18 '$ (!) 21  Temp:   98.5 F (36.9 C)   TempSrc:   Oral   SpO2: 100% 100% 98% 100%   No intake or output data in the 24 hours ending 12/15/20 1331 There were no vitals filed for this visit.  Exam: General: Obese male, sitting up with obvious shortness of breath. Cardiovascular: Heart sounds are tachycardic, regular. No gallops or rubs. No murmurs. No JVD. Lungs: Only able to speak in short sentences, tachypneic with bibasilar rales. Abdomen: Soft, nontender, nondistended with normal active bowel sounds. No masses. No hepatosplenomegaly. Neurological: Alert and oriented 3. Moves all extremities 4 with equal strength. Cranial nerves II through XII grossly intact. Skin: Warm and dry. No rashes or lesions. Extremities: No clubbing or cyanosis. 3+ pitting edema. Pedal pulses 2+. Psychiatric: Mood and affect are flat. Insight and judgment are fair.     Data Reviewed:   I have personally reviewed following labs and imaging studies:  Labs: Labs show the following:   Basic Metabolic Panel: Recent Labs  Lab 12/14/20 1431 12/15/20 0305  NA 132* 133*  K 4.6 4.5  CL 91* 92*  CO2 24 27  GLUCOSE 102* 86  BUN 99* 104*  CREATININE 5.49* 5.52*  CALCIUM 10.6* 10.8*   GFR Estimated Creatinine Clearance: 17.6 mL/min (A) (by C-G formula based on SCr of 5.52 mg/dL (H)).  CBC: Recent Labs  Lab 12/14/20 1431 12/15/20 0305  WBC 9.2 9.1  HGB 9.2* 9.7*  HCT 29.6* 30.7*  MCV 103.1* 105.1*  PLT 161 205    Microbiology Recent Results (from the past 240 hour(s))  SARS CORONAVIRUS 2 (TAT 6-24 HRS) Nasopharyngeal Nasopharyngeal Swab     Status: None   Collection Time: 12/14/20  5:47 PM   Specimen: Nasopharyngeal Swab  Result Value Ref Range Status   SARS Coronavirus 2 NEGATIVE NEGATIVE Final    Comment:  (NOTE) SARS-CoV-2 target nucleic acids are NOT DETECTED.  The SARS-CoV-2 RNA is generally detectable in upper and lower respiratory specimens during the acute phase of infection. Negative results do not preclude SARS-CoV-2 infection, do not rule out co-infections with other pathogens, and should not be used as the sole basis for treatment or other patient management decisions. Negative results must be combined with clinical observations, patient history, and epidemiological information. The expected result is Negative.  Fact Sheet for Patients: SugarRoll.be  Fact Sheet for Healthcare Providers: https://www.woods-mathews.com/  This test is not yet approved or cleared by the [place] FDA and  has been authorized for detection and/or diagnosis of SARS-CoV-2 by FDA under an Emergency Use Authorization (EUA). This EUA will remain  in effect (meaning this test can be used) for the duration of the COVID-19 declaration under Se ction 564(b)(1) of the Act, 21 U.S.C. section 360bbb-3(b)(1), unless the authorization is terminated or revoked sooner.  Performed at [facility],  [address], [place], [place] [address]     Procedures and diagnostic studies:  DG Chest 2 View  Result Date: 12/14/2020 CLINICAL DATA:  Shortness of breath on exertion. EXAM: CHEST - 2 VIEW COMPARISON:  October 09, 2020 FINDINGS: Cardiac silhouette is enlarged.  Mediastinal contours appear intact. Elevation of the left hemidiaphragm. Streaky airspace opacities in the lingula. Osseous structures are without acute abnormality. Soft tissues are grossly normal. IMPRESSION: Streaky airspace opacities in the lingula, may represent atelectasis or peribronchial airspace consolidation. Enlarged cardiac silhouette. Electronically Signed   By: Fidela Salisbury M.D.   On: 12/14/2020 15:52    Medications:   . apixaban  2.5 mg Oral BID  . furosemide  40 mg Intravenous Q12H   . metoprolol succinate  100 mg Oral Daily  . sevelamer carbonate  800 mg Oral TID WC  . sodium bicarbonate  650 mg Oral TID   Continuous Infusions:   LOS: 0 days   Jacquelynn Cree, MD  [facility]   [facility] How to contact the [facility] Attending or Consulting provider [place] or covering provider during after hours [place], for this patient?  1. Check the care team in [facility] and look for a) attending/consulting [facility] provider listed and b) the [facility] team listed 2. Log into [email or web address] and use [place]'s universal password to access. If you do not have the password, please contact the hospital operator. 3. Locate the [facility] provider you are looking for under [facility] and page to a number that you can be directly reached. 4. If you still have difficulty reaching the provider, please page the [facility] (Director on Call) for the Hospitalists listed on amion for assistance.  12/15/2020, 1:31 PM

## 2020-12-15 NOTE — ED Notes (Signed)
Tele Breakfast order placed 

## 2020-12-16 DIAGNOSIS — N179 Acute kidney failure, unspecified: Secondary | ICD-10-CM | POA: Diagnosis not present

## 2020-12-16 DIAGNOSIS — E669 Obesity, unspecified: Secondary | ICD-10-CM | POA: Diagnosis present

## 2020-12-16 DIAGNOSIS — D6869 Other thrombophilia: Secondary | ICD-10-CM | POA: Diagnosis present

## 2020-12-16 DIAGNOSIS — I4892 Unspecified atrial flutter: Secondary | ICD-10-CM | POA: Diagnosis not present

## 2020-12-16 DIAGNOSIS — Z22322 Carrier or suspected carrier of Methicillin resistant Staphylococcus aureus: Secondary | ICD-10-CM

## 2020-12-16 DIAGNOSIS — G9341 Metabolic encephalopathy: Secondary | ICD-10-CM | POA: Diagnosis present

## 2020-12-16 DIAGNOSIS — I10 Essential (primary) hypertension: Secondary | ICD-10-CM | POA: Diagnosis not present

## 2020-12-16 DIAGNOSIS — I5033 Acute on chronic diastolic (congestive) heart failure: Secondary | ICD-10-CM | POA: Diagnosis not present

## 2020-12-16 LAB — MRSA PCR SCREENING: MRSA by PCR: POSITIVE — AB

## 2020-12-16 LAB — RENAL FUNCTION PANEL
Albumin: 2.6 g/dL — ABNORMAL LOW (ref 3.5–5.0)
Anion gap: 12 (ref 5–15)
BUN: 109 mg/dL — ABNORMAL HIGH (ref 8–23)
CO2: 30 mmol/L (ref 22–32)
Calcium: 10.5 mg/dL — ABNORMAL HIGH (ref 8.9–10.3)
Chloride: 93 mmol/L — ABNORMAL LOW (ref 98–111)
Creatinine, Ser: 5.85 mg/dL — ABNORMAL HIGH (ref 0.61–1.24)
GFR, Estimated: 10 mL/min — ABNORMAL LOW (ref >60)
Glucose, Bld: 118 mg/dL — ABNORMAL HIGH (ref 70–99)
Phosphorus: 7.7 mg/dL — ABNORMAL HIGH (ref 2.5–4.6)
Potassium: 3.9 mmol/L (ref 3.5–5.1)
Sodium: 135 mmol/L (ref 135–145)

## 2020-12-16 LAB — URINALYSIS, ROUTINE W REFLEX MICROSCOPIC
Bilirubin Urine: NEGATIVE
Glucose, UA: NEGATIVE mg/dL
Ketones, ur: NEGATIVE mg/dL
Leukocytes,Ua: NEGATIVE
Nitrite: NEGATIVE
Protein, ur: NEGATIVE mg/dL
Specific Gravity, Urine: 1.011 (ref 1.005–1.030)
pH: 5 (ref 5.0–8.0)

## 2020-12-16 LAB — GLUCOSE, CAPILLARY: Glucose-Capillary: 91 mg/dL (ref 70–99)

## 2020-12-16 MED ORDER — ALBUMIN HUMAN 25 % IV SOLN
25.0000 g | Freq: Four times a day (QID) | INTRAVENOUS | Status: AC
  Administered 2020-12-16 – 2020-12-17 (×3): 25 g via INTRAVENOUS
  Filled 2020-12-16 (×5): qty 100

## 2020-12-16 MED ORDER — MUPIROCIN 2 % EX OINT
1.0000 "application " | TOPICAL_OINTMENT | Freq: Two times a day (BID) | CUTANEOUS | Status: AC
  Administered 2020-12-16 – 2020-12-20 (×10): 1 via NASAL
  Filled 2020-12-16 (×2): qty 22

## 2020-12-16 MED ORDER — CHLORHEXIDINE GLUCONATE CLOTH 2 % EX PADS
6.0000 | MEDICATED_PAD | Freq: Every day | CUTANEOUS | Status: AC
  Administered 2020-12-16 – 2020-12-20 (×5): 6 via TOPICAL

## 2020-12-16 MED ORDER — MIDODRINE HCL 5 MG PO TABS
10.0000 mg | ORAL_TABLET | Freq: Three times a day (TID) | ORAL | Status: DC
  Administered 2020-12-16 – 2020-12-17 (×2): 10 mg via ORAL
  Filled 2020-12-16 (×3): qty 2

## 2020-12-16 MED ORDER — TAMSULOSIN HCL 0.4 MG PO CAPS
0.4000 mg | ORAL_CAPSULE | Freq: Every day | ORAL | Status: DC
Start: 2020-12-16 — End: 2020-12-23
  Administered 2020-12-16 – 2020-12-23 (×8): 0.4 mg via ORAL
  Filled 2020-12-16 (×8): qty 1

## 2020-12-16 NOTE — Progress Notes (Signed)
Positive MRSA protocol started due to lab results.

## 2020-12-16 NOTE — Progress Notes (Addendum)
Progress Note    Mark [name]  [id number] DOB: Jan 01, 1961  DOA: 12/14/2020 PCP: Delrae Rend, MD    Brief Narrative:   Chief complaint: Shortness of breath, orthopnea, edema.  Medical records reviewed and are as summarized below:  Mark [name] is an 60 y.o. male with a PMH of chronic atrial flutter on Eliquis, CKD stage V, chronic diastolic CHF (EF 123456 by TTE 10/09/2020), anemia of chronic kidney disease, diet-controlled type 2 diabetes, recurrent left hip dislocation who presents to the ED via EMS for evaluation of a 5 day h/o worsening dyspnea. In the ED, initial vitals were stable with a SPO2 of 97% on room air.Labs showed BUN 99, creatinine 5.49 (previously 4.48 on 10/16/2020), sodium 132, potassium 4.6, bicarb 24, serum glucose 102, calcium 10.6, WBC 9.2, hemoglobin 9.2, platelets 161,000, high-sensitivity troponin I 37 > 34, BNP 1930.2. 2 view chest x-ray showed cardiomegaly with elevation of left hemidiaphragm and streaky airspace opacities in the lingula. The patient was given IV Lasix 80 mg once and the hospitalist service was consulted to admit for further evaluation and management. Cardiology and nephrology have been consulted to assist with management. Cardiology feels nothing to add given normal EF and advanced renal disease and prefers to let nephrology manage volume status issues.  Assessment/Plan:   Principle Problem: Acute on chronic diastolic CHF exacerbation, POA associated with hyponatremia and acute encephalopathy (metabolic) EF 123456 by echo 10/09/20, with indeterminate diastolic parameters. He was given IV Lasix 80 mg once in the ED, and started on IV Lasix 40 mg BID.Tachypnea improving. Sodium improved but noted to be confused/disoriented over night.  Normally is completely oriented, but intermittently confused over night, likely from uremia/metabolic causes. Continue to diurese. Monitor chemistries. Mildly hypotensive overnight. I/O - 1.2 L.  Weight  unchanged. Creatinine up. Discussed with cardiology, given normal EF, prefer to let nephrology manage volume issues. Discussed care with [name] who will see the patient in consultation.   Active problems: Urinary retention, Acute, POA Will try Flomax. Check renal ultrasound.  MRSA Carrier, POA Decontamination prescribed.  AKI on CKD stage IV, POA Creatinine elevated compared to recent baseline, suspected to be related to hypervolemia.  Continue Lasix diuresis as above and monitor closely.  Patient has a left wrist aVF placed in 2017 but has not required initiation of dialysis. Creatinine essentially slightly up today. Continue Renvela and sodium bicarbonate tabletes. Check renal ultrasound given urinary retention. Spoke with [name] who will consult given the patient's deteriorating renal function.  Given encephalopathy over night, worried he is becoming uremic.  Chronic atrial flutter/acquired thrombophilia, POA Remains in atrial flutter with controlled rate.  Continue reduced dose Eliquis (2.5 mg) twice daily given degree of renal dysfunction.  Continue Toprol-XL.  Anemia of chronic kidney disease, chronic, POA Chronic and stable.  Patient reported seeing some minor bleeding several days ago therefore he held his Eliquis.  Continue reduced dose Eliquis dose (2.5 mg twice daily).  Continue to monitor. Hgb stable.  Diet controlled DM with renal complications, POA Chronic, controlled. Glucoses 86-118 on am chemistries.  Hypercalcemia/hyperphosphatemia, chronic, related to CKD, POA Likely from secondary hyperparathyroidism related to CKD. Phosphorus elevated.  Already on Renvela.    Benign essential HTN, chronic, POA BP low.  D/C metoprolol for now.  Obesity  Body mass index is 39.1 kg/m.   Family Communication/Anticipated D/C date and plan/Code Status   DVT prophylaxis: apixaban (ELIQUIS) tablet 2.5 mg Start: 12/14/20 2200 apixaban (ELIQUIS) tablet 2.5 mg  Current  Level of Care:: Telemetry Cardiac Code Status: Full Code.  Family Communication: Brother updated by phone 12/15/20, no answer today. Disposition Plan: Status is: Inpatient  The patient will require care spanning > 2 midnights and should be moved to inpatient because: Inpatient level of care appropriate due to severity of illness, volume overloaded, dyspneic and still requiring IV lasix in the setting of CKD/AKI.   Dispo: The patient is from: Home              Anticipated d/c is to: Home              Anticipated d/c date is: 2 days              Patient currently is not medically stable to d/c.   Difficult to place patient No    Medical Consultants:    Nephrology   Anti-Infectives:    None  Subjective:   Still short of breath, only mildly improved.  Became confused overnight.  RN reports that he is "forgetful", and has poor insight. Also had to be I&O cathed due to acute urinary retention.  [name] reports he has had "kidney problems" in the past.  Appetite OK, eating lunch.  Objective:    Vitals:   12/16/20 0000 12/16/20 0005 12/16/20 0316 12/16/20 0500  BP: (!) 86/69  90/69 95/69  Pulse: 74  70   Resp: '20 16 17 19  '$ Temp:   97.6 F (36.4 C)   TempSrc:   Oral   SpO2: 100%  100%   Weight:   120.1 kg   Height:        Intake/Output Summary (Last 24 hours) at 12/16/2020 0718 Last data filed at 12/16/2020 0426 Gross per 24 hour  Intake 240 ml  Output 1500 ml  Net -1260 ml   Filed Weights   12/15/20 2207 12/16/20 0316  Weight: 120.1 kg 120.1 kg    Exam:  General: Obese male, sitting up in bed eating lunch. Cardiovascular: Heart sounds show a regular rate, and rhythm. II/VI systolic murmur. No obvious JVD but difficult to assess given body habitus. Lungs: Decreased with a few rales. Able to speak in short phrases, but becomes winded. Abdomen: Soft, nontender, nondistended with normal active bowel sounds. No masses. No hepatosplenomegaly. Neurological: Alert and  oriented to self, place, month. Moves all extremities 4 with equal but diminished strength. Cranial nerves II through XII grossly intact. Skin: Warm and dry. No rashes or lesions. Extremities: No clubbing or cyanosis. 3+ edema. Pedal pulses 2+. Psychiatric: Mood and affect are flat. Insight and judgment are fair.     Data Reviewed:   I have personally reviewed following labs and imaging studies:  Labs: Labs show the following:   Basic Metabolic Panel: Recent Labs  Lab 12/14/20 1431 12/15/20 0305 12/15/20 2219 12/16/20 0103  NA 132* 133*  --  135  K 4.6 4.5  --  3.9  CL 91* 92*  --  93*  CO2 24 27  --  30  GLUCOSE 102* 86  --  118*  BUN 99* 104*  --  109*  CREATININE 5.49* 5.52*  --  5.85*  CALCIUM 10.6* 10.8*  --  10.5*  PHOS  --   --  7.5* 7.7*   GFR Estimated Creatinine Clearance: 17 mL/min (A) (by C-G formula based on SCr of 5.85 mg/dL (H)).  CBC: Recent Labs  Lab 12/14/20 1431 12/15/20 0305  WBC 9.2 9.1  HGB 9.2* 9.7*  HCT 29.6*  30.7*  MCV 103.1* 105.1*  PLT 161 205    Microbiology Recent Results (from the past 240 hour(s))  SARS CORONAVIRUS 2 (TAT 6-24 HRS) Nasopharyngeal Nasopharyngeal Swab     Status: None   Collection Time: 12/14/20  5:47 PM   Specimen: Nasopharyngeal Swab  Result Value Ref Range Status   SARS Coronavirus 2 NEGATIVE NEGATIVE Final    Comment: (NOTE) SARS-CoV-2 target nucleic acids are NOT DETECTED.  The SARS-CoV-2 RNA is generally detectable in upper and lower respiratory specimens during the acute phase of infection. Negative results do not preclude SARS-CoV-2 infection, do not rule out co-infections with other pathogens, and should not be used as the sole basis for treatment or other patient management decisions. Negative results must be combined with clinical observations, patient history, and epidemiological information. The expected result is Negative.  Fact Sheet for  Patients: SugarRoll.be  Fact Sheet for Healthcare Providers: https://www.woods-mathews.com/  This test is not yet approved or cleared by the [place] FDA and  has been authorized for detection and/or diagnosis of SARS-CoV-2 by FDA under an Emergency Use Authorization (EUA). This EUA will remain  in effect (meaning this test can be used) for the duration of the COVID-19 declaration under Se ction 564(b)(1) of the Act, 21 U.S.C. section 360bbb-3(b)(1), unless the authorization is terminated or revoked sooner.  Performed at [facility], [place] [address], [place], [place] [address]   MRSA PCR Screening     Status: Abnormal   Collection Time: 12/15/20 11:30 PM   Specimen: Nasopharyngeal  Result Value Ref Range Status   MRSA by PCR POSITIVE (A) NEGATIVE Final    Comment:        The GeneXpert MRSA Assay (FDA approved for NASAL specimens only), is one component of a comprehensive MRSA colonization surveillance program. It is not intended to diagnose MRSA infection nor to guide or monitor treatment for MRSA infections. RESULT CALLED TO, READ BACK BY AND VERIFIED WITH: HAGERMAN,K RN [id number] 12/16/2020 MITCHELL,L Performed at [facility], [place] [address], [place], [place] [address]     Procedures and diagnostic studies:  DG Chest 2 View  Result Date: 12/14/2020 CLINICAL DATA:  Shortness of breath on exertion. EXAM: CHEST - 2 VIEW COMPARISON:  October 09, 2020 FINDINGS: Cardiac silhouette is enlarged.  Mediastinal contours appear intact. Elevation of the left hemidiaphragm. Streaky airspace opacities in the lingula. Osseous structures are without acute abnormality. Soft tissues are grossly normal. IMPRESSION: Streaky airspace opacities in the lingula, may represent atelectasis or peribronchial airspace consolidation. Enlarged cardiac silhouette. Electronically Signed   By: Fidela Salisbury M.D.   On: 12/14/2020 15:52     Medications:   . apixaban  2.5 mg Oral BID  . Chlorhexidine Gluconate Cloth  6 each Topical Q0600  . furosemide  40 mg Intravenous Q12H  . metoprolol succinate  100 mg Oral Daily  . mupirocin ointment  1 application Nasal BID  . sevelamer carbonate  800 mg Oral TID WC  . sodium bicarbonate  650 mg Oral TID   Continuous Infusions:   LOS: 1 day   Jacquelynn Cree, MD  [facility]   [facility] How to contact the [facility] Attending or Consulting provider [place] or covering provider during after hours [place], for this patient?  1. Check the care team in [facility] and look for a) attending/consulting [facility] provider listed and b) the [facility] team listed 2. Log into [email or web address] and use 's universal password to access.  If you do not have the password, please contact the hospital operator. 3. Locate the [facility] provider you are looking for under [facility] and page to a number that you can be directly reached. 4. If you still have difficulty reaching the provider, please page the [facility] (Director on Call) for the Hospitalists listed on amion for assistance.  12/16/2020, 7:18 AM

## 2020-12-16 NOTE — Consult Note (Addendum)
Nephrology Consult   Requesting provider: Jacquelynn Cree, MD Service requesting consult: Internal medicine Reason for consult: Worsening renal function   Assessment/Recommendations: Mark [name] is a/an 60 y.o. male with a past medical history significant for atrial flutter on Eliquis, CKD stage V, HFpEF with most recent TTE 10/09/2020 ejection fraction 60-65%, anemia of chronic kidney disease, T2DM presents to the emergency department with 5 days of worsening dyspnea.  AKI on CKD stage IV: Baseline creatinine appears to be 4.25-4.75 on the most recent hospitalizations, however as outpatient creatinine seems to be around 3.9.  Initial creatinine on presentation of 5.49, worsened to 5.85 today.  Likely secondary to hypervolemia in the setting of CHF exacerbation/cardiorenal. Patient does have a left wrist aVF that was placed in 2017 though has not required initiating dialysis. Initial labs significant for elevated creatinine as above, phosphorus elevated 7.7, calcium elevated 10.5. Patient has a decreased albumin of 2.6 and a bicarb of 30.  Renal ultrasound ordered.  Home medications include 40 mg oral Lasix twice daily. Patient currently receiving 40 mg IV Lasix twice daily with 1500 mL urine output in the past 24 hours.  The patient has had hypotension with most recent blood pressure 89/64 and this does make diuresis somewhat challenging as we do want to maintain good perfusion to his kidneys.  We will recommend close monitoring of his blood pressures and continue diuresis of 40 mg IV Lasix twice daily as he has had good urine output so far. -Continue 40 mg IV Lasix twice daily as above -Midodrine 10 mg 3 times daily -Close monitoring of his blood pressures to maintain renal perfusion -We will discontinue home sodium bicarb -We will give albumin -Continue to monitor daily Cr -Monitor Daily I/Os, Daily weight  -Avoid nephrotoxic medications, current medications reviewed. -Renal ultrasound as  above -Urinalysis ordered -Can consider HD as patient does already have a fistula, however this is somewhat limited due to blood pressures  Uremia: BUN elevated at 109.  Patient is endorsing some symptoms of nausea and appears drowsy on exam.  We will continue to monitor on morning labs and will consider hemodialysis if worsens and if blood pressure tolerates.  Volume Status: Appears volume overloaded on exam with 1-2+ pitting edema to the level of the knee and some rales on pulmonary auscultation.  His picture is somewhat challenging as we want to maintain good renal perfusion and he does have hypotensive blood pressures at this time, however we also need to diurese due to his volume status.  Based on our examination and review of available imaging, our recommendation is continuation of diuresis with 40 mg IV Lasix twice daily as above and close monitoring with strict I's&O's/daily weights. -Diurese as above -Midodrine as above  Hypotension: Blood pressure range over past 24 hours 84-99/61-71.  Most recent blood pressure 89/64.  Patient does note some dizziness.  We will start midodrine, 10 mg 3 times per day. -Midodrine as above  Anemia due to CKD: Most recent hemoglobin 9.7.  Baseline appears to be 8-8.5.  Iron studies completed on 10/13/2020 with decreased iron at 28, decreased sat ratio of 13, increased ferritin of 599, decreased TIBC of 216.  Home medications include ferrous sulfate 325 daily.  On Eliquis for history of atrial flutter.  No complaints of bleeding at this time. -We will check iron studies in the a.m.  Acute on chronic diastolic CHF exacerbation: Most recent echo 60-65% in December of last year with mild left ventricular hypertrophy and indeterminate left  ventricular diastolic parameters.  Currently on 2 L nasal cannula.  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  Calcium elevated at 10.5.  PTH checked in December 2021 shows intact PTH elevated at 214, consistent with primary  hyperparathyroidism.  Continue Renvela.  Urinary retention:  Likely secondary to BPH. Agree with trial of flomax per primary. Renal ultrasound ordered. -Every 12 hours bladder scans -Diuresis as above, consider increasing furosemide if blood pressure can tolerate it after starting midodrine  Diet-controlled diabetes: Treatment per primary  [facility] 12/16/2020 2:28 PM   _____________________________________________________________________________________ CC: Shortness of breath  History of Present Illness: Mark [name] is a/an 60 y.o. male with a past medical history of atrial flutter on Eliquis, CKD stage V, HFpEF with most recent TTE 10/09/2020 ejection fraction 60-65%, anemia of chronic kidney disease, T2DM presents to the emergency department with 5 days of worsening dyspnea.  Patient states that over the past week he noticed more leg swelling and shortness of breath that progressed over the week.  He states that he does have some swelling at baseline in his legs but it got significantly worse than usual.  He states that today his legs are a bit improved from yesterday.  He does not normally use oxygen at home and has been using 2 to 3 L of oxygen during his hospitalization here.  He states he does not have a known history of low blood pressure and often has blood pressures ranging on the higher side.  Medications:  Current Facility-Administered Medications  Medication Dose Route Frequency Provider Last Rate Last Admin  . acetaminophen (TYLENOL) tablet 650 mg  650 mg Oral Q6H PRN Lenore Cordia, MD       Or  . acetaminophen (TYLENOL) suppository 650 mg  650 mg Rectal Q6H PRN Lenore Cordia, MD      . apixaban (ELIQUIS) tablet 2.5 mg  2.5 mg Oral BID Zada Finders R, MD   2.5 mg at 12/16/20 1058  . Chlorhexidine Gluconate Cloth 2 % PADS 6 each  6 each Topical Q0600 Rama, Venetia Maxon, MD   6 each at 12/16/20 [phone]  . furosemide (LASIX) injection 40 mg   40 mg Intravenous Q12H Zada Finders R, MD   40 mg at 12/16/20 1058  . mupirocin ointment (BACTROBAN) 2 % 1 application  1 application Nasal BID Rama, Venetia Maxon, MD   1 application at 0000000 [phone]  . ondansetron (ZOFRAN) tablet 4 mg  4 mg Oral Q6H PRN Lenore Cordia, MD       Or  . ondansetron (ZOFRAN) injection 4 mg  4 mg Intravenous Q6H PRN Zada Finders R, MD      . sevelamer carbonate (RENVELA) tablet 800 mg  800 mg Oral TID WC Zada Finders R, MD   800 mg at 12/16/20 1133  . sodium bicarbonate tablet 650 mg  650 mg Oral TID Lenore Cordia, MD   650 mg at 12/16/20 1057  . tamsulosin (FLOMAX) capsule 0.4 mg  0.4 mg Oral Daily Rama, Venetia Maxon, MD   0.4 mg at 12/16/20 1312     ALLERGIES Penicillins, Atorvastatin, Penicillin g, and Rosuvastatin  MEDICAL HISTORY Past Medical History:  Diagnosis Date  . Anemia    labs today indicate (09/27/11)  . Arthritis    right knee  . Bleeding ulcer    hx of, required 6 units blood  . Blood transfusion    6 units with ulcer repair surgery  . Chronic kidney disease  stage IV, has fistula, never started diaylsis  . Complication of anesthesia    little slow to wake up with last surgery  . Dyspnea    with exertion  . Gout   . Hip dislocation, left ([place]) 10/09/2020  . Hypertension   . Osteoporosis   . Pneumonia      SOCIAL HISTORY Social History   Socioeconomic History  . Marital status: Single    Spouse name: Not on file  . Number of children: Not on file  . Years of education: Not on file  . Highest education level: Not on file  Occupational History  . Not on file  Tobacco Use  . Smoking status: Current Some Day Smoker    Packs/day: 0.25    Years: 32.00    Pack years: 8.00    Types: Cigarettes  . Smokeless tobacco: Former Systems developer    Types: Chew    Quit date: 59  . Tobacco comment: smoking cessation consult entered  Vaping Use  . Vaping Use: Never used  Substance and Sexual Activity  . Alcohol use: Yes     Alcohol/week: 5.0 standard drinks    Types: 5 Cans of beer per week    Comment: occasional  . Drug use: No  . Sexual activity: Yes  Other Topics Concern  . Not on file  Social History Narrative  . Not on file   Social Determinants of Health   Financial Resource Strain: Not on file  Food Insecurity: Not on file  Transportation Needs: Not on file  Physical Activity: Not on file  Stress: Not on file  Social Connections: Not on file  Intimate Partner Violence: Not on file     FAMILY HISTORY Family History  Problem Relation Age of Onset  . Cancer Mother   . Atrial fibrillation Mother   . Hypertension Mother   . Cancer Father       Review of Systems: 12 systems reviewed Otherwise as per HPI, all other systems reviewed and negative  Physical Exam: Vitals:   12/16/20 1000 12/16/20 1057  BP: 99/71 (!) 89/64  Pulse: 87 77  Resp: 20   Temp: (!) 97.5 F (36.4 C)   SpO2: 100%    No intake/output data recorded.  Intake/Output Summary (Last 24 hours) at 12/16/2020 1428 Last data filed at 12/16/2020 0426 Gross per 24 hour  Intake 240 ml  Output 1500 ml  Net -1260 ml   General: Elderly male lying in bed, nasal cannula in place, tired appearing but no acute distress HEENT: anicteric sclera CV: S1, S2 present with no murmurs appreciated. Patient with 1-2+ pitting edema bilaterally to the knee.  AV fistula present in left wrist with appropriate bruit and palpable thrill. Lungs: Patient with breath sounds decreased but no respiratory distress on 3 L nasal cannula, does have some rales present Abd: soft, non-tender Skin: no visible lesions or rashes Psych: alert, engaged, appropriate mood and affect Musculoskeletal: No obvious deformities Neuro: normal speech, no gross focal deficits   Test Results Reviewed Lab Results  Component Value Date   NA 135 12/16/2020   K 3.9 12/16/2020   CL 93 (L) 12/16/2020   CO2 30 12/16/2020   BUN 109 (H) 12/16/2020   CREATININE 5.85 (H)  12/16/2020   GLU 84 08/08/2020   CALCIUM 10.5 (H) 12/16/2020   ALBUMIN 2.6 (L) 12/16/2020   PHOS 7.7 (H) 12/16/2020     I have reviewed all relevant outside healthcare records related to the patient's current hospitalization.  Lurline Del, DO

## 2020-12-16 NOTE — Progress Notes (Addendum)
RN paged MD to advise of patient's continued urine retention.  Pt was bladder scanned with scan showing 304 ml.  MD returned paged and advise RN to place foley cath overnight and remove in the morning. RN placed foley. RN will continue to monitor.

## 2020-12-17 ENCOUNTER — Encounter (HOSPITAL_COMMUNITY): Payer: Self-pay

## 2020-12-17 ENCOUNTER — Inpatient Hospital Stay (HOSPITAL_COMMUNITY): Admission: RE | Admit: 2020-12-17 | Payer: 59 | Source: Ambulatory Visit

## 2020-12-17 DIAGNOSIS — I5033 Acute on chronic diastolic (congestive) heart failure: Secondary | ICD-10-CM | POA: Diagnosis not present

## 2020-12-17 LAB — RENAL FUNCTION PANEL
Albumin: 2.9 g/dL — ABNORMAL LOW (ref 3.5–5.0)
Anion gap: 12 (ref 5–15)
BUN: 112 mg/dL — ABNORMAL HIGH (ref 8–23)
CO2: 29 mmol/L (ref 22–32)
Calcium: 10.2 mg/dL (ref 8.9–10.3)
Chloride: 91 mmol/L — ABNORMAL LOW (ref 98–111)
Creatinine, Ser: 5.7 mg/dL — ABNORMAL HIGH (ref 0.61–1.24)
GFR, Estimated: 11 mL/min — ABNORMAL LOW (ref >60)
Glucose, Bld: 91 mg/dL (ref 70–99)
Phosphorus: 7.2 mg/dL — ABNORMAL HIGH (ref 2.5–4.6)
Potassium: 4.1 mmol/L (ref 3.5–5.1)
Sodium: 132 mmol/L — ABNORMAL LOW (ref 135–145)

## 2020-12-17 LAB — CBC
HCT: 26.5 % — ABNORMAL LOW (ref 39.0–52.0)
Hemoglobin: 8.1 g/dL — ABNORMAL LOW (ref 13.0–17.0)
MCH: 32.8 pg (ref 26.0–34.0)
MCHC: 30.6 g/dL (ref 30.0–36.0)
MCV: 107.3 fL — ABNORMAL HIGH (ref 80.0–100.0)
Platelets: 159 10*3/uL (ref 150–400)
RBC: 2.47 MIL/uL — ABNORMAL LOW (ref 4.22–5.81)
RDW: 16.4 % — ABNORMAL HIGH (ref 11.5–15.5)
WBC: 9.2 10*3/uL (ref 4.0–10.5)
nRBC: 0 % (ref 0.0–0.2)

## 2020-12-17 LAB — FERRITIN: Ferritin: 339 ng/mL — ABNORMAL HIGH (ref 24–336)

## 2020-12-17 LAB — IRON AND TIBC
Iron: 25 ug/dL — ABNORMAL LOW (ref 45–182)
Saturation Ratios: 12 % — ABNORMAL LOW (ref 17.9–39.5)
TIBC: 209 ug/dL — ABNORMAL LOW (ref 250–450)
UIBC: 184 ug/dL

## 2020-12-17 MED ORDER — FUROSEMIDE 10 MG/ML IJ SOLN
60.0000 mg | Freq: Two times a day (BID) | INTRAMUSCULAR | Status: DC
  Administered 2020-12-17 – 2020-12-18 (×3): 60 mg via INTRAVENOUS
  Filled 2020-12-17 (×3): qty 6

## 2020-12-17 MED ORDER — MIDODRINE HCL 5 MG PO TABS
15.0000 mg | ORAL_TABLET | Freq: Three times a day (TID) | ORAL | Status: DC
  Administered 2020-12-17 – 2020-12-23 (×18): 15 mg via ORAL
  Filled 2020-12-17 (×19): qty 3

## 2020-12-17 MED ORDER — DARBEPOETIN ALFA 100 MCG/0.5ML IJ SOSY
100.0000 ug | PREFILLED_SYRINGE | Freq: Once | INTRAMUSCULAR | Status: AC
Start: 2020-12-17 — End: 2020-12-17
  Administered 2020-12-17: 100 ug via SUBCUTANEOUS
  Filled 2020-12-17: qty 0.5

## 2020-12-17 MED ORDER — SODIUM CHLORIDE 0.9 % IV SOLN
250.0000 mg | Freq: Every day | INTRAVENOUS | Status: AC
  Administered 2020-12-17 – 2020-12-18 (×2): 250 mg via INTRAVENOUS
  Filled 2020-12-17 (×2): qty 20

## 2020-12-17 NOTE — Progress Notes (Signed)
[facility] Progress Note    Assessment/ Plan:    AKI on CKD stage IV: Baseline creatinine appears to be 4.25-4.75 on the most recent hospitalizations, however as outpatient creatinine seems to be around 3.9.  Initial creatinine on presentation of 5.49>5.85>5.70.  Likely secondary to hypervolemia in the setting of CHF exacerbation/cardiorenal. Patient does have a left wrist aVF that was placed in 2017 though has not required initiating dialysis. The discussion was had with the patient yesterday about the possibility of needing to start HD during this hospitalization for worsening labs.  BUN increasing from 109-112.  Phos elevated at 7.2 but improved from yesterday, albumin 2.9.  I's and O's show [id number] urine output over last 24 hours.  Urinalysis shows moderate hemoglobin, negative leuk esterase and nitrite, rare bacteria, 21-50 RBCs per high-power field.  Patient has had multiple in and out caths as well as a Foley overnight for urinary retention. - Possible HD today pending discussion with nephro attending -Continue 40 mg IV Lasix twice daily as above -Midodrine 15 mg 3 times daily -Close monitoring of his blood pressures to maintain renal perfusion -Continue to monitor daily Cr -Monitor Daily I/Os, Daily weight  -Urinalysis ordered -Can consider HD as patient does already have a fistula, however this is somewhat limited due to blood pressures  Uremia: BUN elevated at 109>112. Patient denies nausea but states he has felt dizzy and tired today.  Consider hemodialysis if worsens and if blood pressure tolerates.  Volume Status: Still appears fluid up on exam with 1+ pitting edema to level of the knee.   Hypotension: Blood pressure range 84-95/59-67 since last night.  Most recent blood pressure 105/74.  Patient does note dizziness. Currently on midodrine 10 mg 3 times per day  Anemia due to CKD: Most recent hemoglobin 8.1.  Baseline appears to be 8-8.5.   Iron studies show iron  decreased to 25, TIBC reduced at 209, sat ratio reduced at 12. Home medications include ferrous sulfate 325 daily.  On Eliquis for history of atrial flutter.  No complaints of bleeding at this time.  We will give IV iron.  Acute on chronic diastolic CHF exacerbation: Most recent echo 60-65% in December of last year with mild left ventricular hypertrophy and indeterminate left ventricular diastolic parameters.  Currently on 2L nasal cannula.  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  Calcium elevated at 10.2.  PTH checked in December 2021 shows intact PTH elevated at 214, consistent with primary hyperparathyroidism.  Continue Renvela.  Urinary retention:  Likely secondary to BPH. Agree with trial of flomax per primary.  Patient did receive a Foley overnight for urinary retention, with plans to discontinue it this morning, and has had in and out caths during this hospitalization so far. -Every 12 hours bladder scans after Foley is discontinued -Diuresis as above, consider increasing furosemide if blood pressure can tolerate it after starting midodrine  Diet-controlled diabetes: Treatment per primary  Subjective:   Patient states he has felt more dizzy today. He denies nausea. No shortness of breath on 2L [place].   Objective:   BP (!) 87/59 (BP Location: Right Arm)   Pulse 76   Temp 98 F (36.7 C) (Oral)   Resp 16   Ht '5\' 9"'$  (1.753 m)   Wt 121.9 kg   SpO2 99%   BMI 39.69 kg/m   Intake/Output Summary (Last 24 hours) at 12/17/2020 0751 Last data filed at 12/17/2020 0500 Gross per 24 hour  Intake 264.25 ml  Output 1675 ml  Net -1410.75 ml   Weight change: 1.8 kg  Physical Exam: General: Tired appearing elderly male sitting in bed Heart: Tachycardic rate with no murmurs appreciated. AVF in left forearm with appropriate bruit and palpable thrill. Lungs: Some crackles present bilaterally.  Skin: Warm and dry Extremities: 1-2+ pitting edema to the level of the knee.   Imaging: No results  found.  Labs: BMET Recent Labs  Lab 12/14/20 1431 12/15/20 0305 12/15/20 2219 12/16/20 0103 12/17/20 0037  NA 132* 133*  --  135 132*  K 4.6 4.5  --  3.9 4.1  CL 91* 92*  --  93* 91*  CO2 24 27  --  30 29  GLUCOSE 102* 86  --  118* 91  BUN 99* 104*  --  109* 112*  CREATININE 5.49* 5.52*  --  5.85* 5.70*  CALCIUM 10.6* 10.8*  --  10.5* 10.2  PHOS  --   --  7.5* 7.7* 7.2*   CBC Recent Labs  Lab 12/14/20 1431 12/15/20 0305 12/17/20 0037  WBC 9.2 9.1 9.2  HGB 9.2* 9.7* 8.1*  HCT 29.6* 30.7* 26.5*  MCV 103.1* 105.1* 107.3*  PLT 161 205 159    Medications:    . apixaban  2.5 mg Oral BID  . Chlorhexidine Gluconate Cloth  6 each Topical Q0600  . furosemide  40 mg Intravenous Q12H  . midodrine  10 mg Oral TID WC  . mupirocin ointment  1 application Nasal BID  . sevelamer carbonate  800 mg Oral TID WC  . tamsulosin  0.4 mg Oral Daily     Lurline Del, DO [facility] Family Medicine Resident, PGY2 12/17/2020, 7:51 AM

## 2020-12-17 NOTE — Progress Notes (Signed)
Progress Note    Mark [name]  [id number] DOB: 29-Jan-1961  DOA: 12/14/2020 PCP: Delrae Rend, MD    Brief Narrative:     Medical records reviewed and are as summarized below:  Mark [name] is an 60 y.o. male with a PMH ofchronic atrial flutter on Eliquis, CKD stage V, chronic diastolic CHF (EF 123456 by TTE 10/09/2020), anemia of chronic kidney disease, diet-controlled type 2 diabetes, recurrent left hip dislocationwho presents to the ED via EMS for evaluation of a 5 day h/o worsening dyspnea.  chest x-ray showed cardiomegaly with elevation of left hemidiaphragm and streaky airspace opacities in the lingula. The patient was given IV Lasix 80 mg once and the hospitalist service was consulted to admit for further evaluation and management. Cardiology and nephrology have been consulted to assist with management. Cardiology feels nothing to add given normal EF and advanced renal disease and prefers to let nephrology manage volume status issues.  Assessment/Plan:   Principal Problem:   Acute on chronic diastolic (congestive) heart failure (HCC) Active Problems:   DM (diabetes mellitus) ([place]) with renal complications without long term use of insulin   Benign essential HTN   Acute renal failure superimposed on chronic kidney disease (HCC)   CKD (chronic kidney disease), stage IV (HCC)   Atrial flutter (HCC)   Hyponatremia   Hypercalcemia   Anemia of chronic disease   MRSA carrier   Hyperphosphatemia   Acute metabolic encephalopathy   Acquired thrombophilia ([place])    Acute on chronic diastolic CHF exacerbation, POA associated with hyponatremia and acute encephalopathy (metabolic) EF 123456 by echo 10/09/20, with indeterminate diastolic parameters.  -nephrology diuresing -may need HD in next few days -started on midodrine for BP Support so diuretics can be given  Active problems: Urinary retention, Acute, POA -foley placed 2/8 for acute urinary retention -while  aggressively diuresing will leave in -flomax  MRSA Carrier, POA Decontamination prescribed.  AKI on CKD stage IV, POA Creatinine elevated compared to recent baseline, suspected to be related to hypervolemia. Continue Lasix diuresis as above and monitor closely. Patient has a left wrist aVF placed in 2017 but has not required initiation of dialysis. -nephrology consult appreciated  Chronic atrialflutter/acquired thrombophilia, POA Remains in atrial flutter with controlled rate. Continue reduced dose Eliquis (2.5 mg) twice daily given degree of renal dysfunction.  -holding Toprol for low BP  Anemia of chronic kidney disease, chronic, POA Chronic and stable. Patient reported seeing some minor bleeding several days ago therefore he held his Eliquis. Continue reduced dose Eliquis dose (2.5 mg twice daily). Continue to monitor. Hgb stable.  Diet controlled DM with renal complications, POA Chronic, controlled.  Hypercalcemia/hyperphosphatemia, chronic, related to CKD, POA Likely from secondary hyperparathyroidism related to CKD. Phosphorus elevated.   sevelamer     Benign essential HTN, chronic, POA BP low.  D/C metoprolol for now.  Obesity Estimated body mass index is 39.69 kg/m as calculated from the following:   Height as of this encounter: '5\' 9"'$  (1.753 m).   Weight as of this encounter: 121.9 kg.  Family Communication/Anticipated D/C date and plan/Code Status   DVT prophylaxis: eliquis Code Status: Full Code.  Disposition Plan: Status is: Inpatient  Remains inpatient appropriate because:Inpatient level of care appropriate due to severity of illness   Dispo: The patient is from: Home              Anticipated d/c is to: Home  Anticipated d/c date is: > 3 days              Patient currently is not medically stable to d/c.   Difficult to place patient No   Medical Consultants:    renal.    Subjective:   No complaints other than feeling  swollen still  Objective:    Vitals:   12/17/20 0340 12/17/20 0512 12/17/20 0804 12/17/20 1141  BP: (!) 87/59  105/74 115/81  Pulse: 76  (!) 111 79  Resp: 16  (!) 21 (!) 21  Temp: 98 F (36.7 C)  (!) 97.4 F (36.3 C) (!) 97.5 F (36.4 C)  TempSrc: Oral  Oral Oral  SpO2: 99%  99% 100%  Weight:  121.9 kg    Height:        Intake/Output Summary (Last 24 hours) at 12/17/2020 1310 Last data filed at 12/17/2020 1235 Gross per 24 hour  Intake 884.25 ml  Output 2425 ml  Net -1540.75 ml   Filed Weights   12/15/20 2207 12/16/20 0316 12/17/20 0512  Weight: 120.1 kg 120.1 kg 121.9 kg    Exam:  General: Appearance:    Obese male in no acute distress     Lungs:      respirations unlabored  Heart:    Normal heart rate. Normal rhythm. No murmurs, rubs, or gallops.   MS:   All extremities are intact. + edema  Neurologic:   Awake, alert, oriented x 3. No apparent focal neurological           defect.     Data Reviewed:   I have personally reviewed following labs and imaging studies:  Labs: Labs show the following:   Basic Metabolic Panel: Recent Labs  Lab 12/14/20 1431 12/15/20 0305 12/15/20 2219 12/16/20 0103 12/17/20 0037  NA 132* 133*  --  135 132*  K 4.6 4.5  --  3.9 4.1  CL 91* 92*  --  93* 91*  CO2 24 27  --  30 29  GLUCOSE 102* 86  --  118* 91  BUN 99* 104*  --  109* 112*  CREATININE 5.49* 5.52*  --  5.85* 5.70*  CALCIUM 10.6* 10.8*  --  10.5* 10.2  PHOS  --   --  7.5* 7.7* 7.2*   GFR Estimated Creatinine Clearance: 17.6 mL/min (A) (by C-G formula based on SCr of 5.7 mg/dL (H)). Liver Function Tests: Recent Labs  Lab 12/16/20 0103 12/17/20 0037  ALBUMIN 2.6* 2.9*   No results for input(s): LIPASE, AMYLASE in the last 168 hours. No results for input(s): AMMONIA in the last 168 hours. Coagulation profile No results for input(s): INR, PROTIME in the last 168 hours.  CBC: Recent Labs  Lab 12/14/20 1431 12/15/20 0305 12/17/20 0037  WBC 9.2 9.1 9.2   HGB 9.2* 9.7* 8.1*  HCT 29.6* 30.7* 26.5*  MCV 103.1* 105.1* 107.3*  PLT 161 205 159   Cardiac Enzymes: No results for input(s): CKTOTAL, CKMB, CKMBINDEX, TROPONINI in the last 168 hours. BNP (last 3 results) No results for input(s): PROBNP in the last 8760 hours. CBG: Recent Labs  Lab 12/16/20 0332  GLUCAP 91   D-Dimer: No results for input(s): DDIMER in the last 72 hours. Hgb A1c: No results for input(s): HGBA1C in the last 72 hours. Lipid Profile: No results for input(s): CHOL, HDL, LDLCALC, TRIG, CHOLHDL, LDLDIRECT in the last 72 hours. Thyroid function studies: No results for input(s): TSH, T4TOTAL, T3FREE, THYROIDAB in the last 72 hours.  Invalid input(s): FREET3 Anemia work up: Recent Labs    12/17/20 0037  FERRITIN 339*  TIBC 209*  IRON 25*   Sepsis Labs: Recent Labs  Lab 12/14/20 1431 12/15/20 0305 12/17/20 0037  WBC 9.2 9.1 9.2    Microbiology Recent Results (from the past 240 hour(s))  SARS CORONAVIRUS 2 (TAT 6-24 HRS) Nasopharyngeal Nasopharyngeal Swab     Status: None   Collection Time: 12/14/20  5:47 PM   Specimen: Nasopharyngeal Swab  Result Value Ref Range Status   SARS Coronavirus 2 NEGATIVE NEGATIVE Final    Comment: (NOTE) SARS-CoV-2 target nucleic acids are NOT DETECTED.  The SARS-CoV-2 RNA is generally detectable in upper and lower respiratory specimens during the acute phase of infection. Negative results do not preclude SARS-CoV-2 infection, do not rule out co-infections with other pathogens, and should not be used as the sole basis for treatment or other patient management decisions. Negative results must be combined with clinical observations, patient history, and epidemiological information. The expected result is Negative.  Fact Sheet for Patients: SugarRoll.be  Fact Sheet for Healthcare Providers: https://www.woods-mathews.com/  This test is not yet approved or cleared by the  [place] FDA and  has been authorized for detection and/or diagnosis of SARS-CoV-2 by FDA under an Emergency Use Authorization (EUA). This EUA will remain  in effect (meaning this test can be used) for the duration of the COVID-19 declaration under Se ction 564(b)(1) of the Act, 21 U.S.C. section 360bbb-3(b)(1), unless the authorization is terminated or revoked sooner.  Performed at [facility], [place] [address] [place], [place] [address]   MRSA PCR Screening     Status: Abnormal   Collection Time: 12/15/20 11:30 PM   Specimen: Nasopharyngeal  Result Value Ref Range Status   MRSA by PCR POSITIVE (A) NEGATIVE Final    Comment:        The GeneXpert MRSA Assay (FDA approved for NASAL specimens only), is one component of a comprehensive MRSA colonization surveillance program. It is not intended to diagnose MRSA infection nor to guide or monitor treatment for MRSA infections. RESULT CALLED TO, READ BACK BY AND VERIFIED WITH: HAGERMAN,K RN [id number] 12/16/2020 MITCHELL,L Performed at [facility], [place] [address], [place], [place] [address]     Procedures and diagnostic studies:  No results found.  Medications:   . apixaban  2.5 mg Oral BID  . Chlorhexidine Gluconate Cloth  6 each Topical Q0600  . furosemide  60 mg Intravenous Q12H  . midodrine  15 mg Oral TID WC  . mupirocin ointment  1 application Nasal BID  . sevelamer carbonate  800 mg Oral TID WC  . tamsulosin  0.4 mg Oral Daily   Continuous Infusions: . ferric gluconate (FERRLECIT/NULECIT) IV 250 mg (12/17/20 1235)     LOS: 2 days   Geradine Girt  [facility]   How to contact the [facility] Attending or Consulting provider [place] or covering provider during after hours [place], for this patient?  1. Check the care team in [facility] and look for a) attending/consulting [facility] provider listed and b) the [facility] team listed 2. Log into [email or web address] and use 's universal password to access. If you do not  have the password, please contact the hospital operator. 3. Locate the [facility] provider you are looking for under [facility] and page to a number that you can be directly reached. 4. If you still have difficulty reaching the provider, please page the [facility] (  Director on Call) for the Hospitalists listed on amion for assistance.  12/17/2020, 1:10 PM

## 2020-12-18 DIAGNOSIS — I5033 Acute on chronic diastolic (congestive) heart failure: Secondary | ICD-10-CM | POA: Diagnosis not present

## 2020-12-18 LAB — RENAL FUNCTION PANEL
Albumin: 3 g/dL — ABNORMAL LOW (ref 3.5–5.0)
Anion gap: 13 (ref 5–15)
BUN: 111 mg/dL — ABNORMAL HIGH (ref 8–23)
CO2: 28 mmol/L (ref 22–32)
Calcium: 10.9 mg/dL — ABNORMAL HIGH (ref 8.9–10.3)
Chloride: 94 mmol/L — ABNORMAL LOW (ref 98–111)
Creatinine, Ser: 5.38 mg/dL — ABNORMAL HIGH (ref 0.61–1.24)
GFR, Estimated: 11 mL/min — ABNORMAL LOW (ref >60)
Glucose, Bld: 107 mg/dL — ABNORMAL HIGH (ref 70–99)
Phosphorus: 6.7 mg/dL — ABNORMAL HIGH (ref 2.5–4.6)
Potassium: 4.2 mmol/L (ref 3.5–5.1)
Sodium: 135 mmol/L (ref 135–145)

## 2020-12-18 LAB — CBC
HCT: 29 % — ABNORMAL LOW (ref 39.0–52.0)
Hemoglobin: 8.8 g/dL — ABNORMAL LOW (ref 13.0–17.0)
MCH: 32.8 pg (ref 26.0–34.0)
MCHC: 30.3 g/dL (ref 30.0–36.0)
MCV: 108.2 fL — ABNORMAL HIGH (ref 80.0–100.0)
Platelets: 146 10*3/uL — ABNORMAL LOW (ref 150–400)
RBC: 2.68 MIL/uL — ABNORMAL LOW (ref 4.22–5.81)
RDW: 16.6 % — ABNORMAL HIGH (ref 11.5–15.5)
WBC: 10.4 10*3/uL (ref 4.0–10.5)
nRBC: 0 % (ref 0.0–0.2)

## 2020-12-18 MED ORDER — FUROSEMIDE 10 MG/ML IJ SOLN
80.0000 mg | Freq: Two times a day (BID) | INTRAMUSCULAR | Status: DC
  Administered 2020-12-18 – 2020-12-20 (×5): 80 mg via INTRAVENOUS
  Filled 2020-12-18 (×6): qty 8

## 2020-12-18 NOTE — Plan of Care (Signed)

## 2020-12-18 NOTE — Progress Notes (Signed)
Progress Note    Mark [name]  [id number] DOB: 02/11/1961  DOA: 12/14/2020 PCP: Delrae Rend, MD    Brief Narrative:     Medical records reviewed and are as summarized below:  Mark [name] is an 60 y.o. male with a PMH ofchronic atrial flutter on Eliquis, CKD stage V, chronic diastolic CHF (EF 123456 by TTE 10/09/2020), anemia of chronic kidney disease, diet-controlled type 2 diabetes, recurrent left hip dislocationwho presents to the ED via EMS for evaluation of a 5 day h/o worsening dyspnea.  chest x-ray showed cardiomegaly with elevation of left hemidiaphragm and streaky airspace opacities in the lingula. The patient was given IV Lasix 80 mg once and the hospitalist service was consulted to admit for further evaluation and management. Cardiology and nephrology have been consulted to assist with management. Cardiology feels nothing to add given normal EF and advanced renal disease and prefers to let nephrology manage volume status issues.   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  -nephrology diuresing -may need HD in next few days -started on midodrine for BP Support so diuretics can be given  Acute respiratory failure -wean to room air -81% documented on RA prior   Urinary retention, Acute,  POA -foley placed 2/8 for acute urinary retention -while aggressively diuresing will leave in -flomax  MRSA Carrier, POA Decontamination prescribed.  AKI on CKD stage IV, POA Creatinine elevated compared to recent baseline, suspected to be related to hypervolemia. Continue Lasix diuresis as above and monitor closely. Patient has a left wrist aVF placed in 2017 but has not required initiation of dialysis. -nephrology consult appreciated  Chronic atrialflutter/acquired thrombophilia, POA Remains in atrial flutter with controlled rate. Continue reduced dose Eliquis (2.5 mg) twice daily given degree of renal dysfunction.  -holding Toprol for low BP  Anemia of chronic kidney disease, chronic, POA Chronic and stable. Patient reported seeing some minor bleeding several days ago therefore he held his Eliquis. Continue reduced dose Eliquis dose (2.5 mg twice daily). Continue to monitor. Hgb stable.  Diet controlled DM with renal complications, POA Chronic, controlled.  Hypercalcemia/hyperphosphatemia, chronic, related to CKD, POA Likely from secondary hyperparathyroidism related to CKD. Phosphorus elevated.   sevelamer     Benign essential HTN, chronic, POA BP low.  D/C metoprolol for now.  Obesity Estimated body mass index is 39.17 kg/m as calculated from the following:   Height as of this encounter: '5\' 9"'$  (1.753 m).   Weight as of this encounter: 120.3 kg.  Family Communication/Anticipated D/C date and plan/Code Status   DVT prophylaxis: eliquis Code Status: Full Code.  Disposition Plan: Status is: Inpatient  Remains inpatient appropriate because:Inpatient level of care appropriate due to severity of illness   Dispo: The patient is from: Home              Anticipated d/c is  to: Home              Anticipated d/c date is: > 3 days              Patient currently is not medically stable to d/c.   Difficult to place patient No   Medical Consultants:     renal.    Subjective:   Feeling tired but also feels less swollen  Objective:    Vitals:   12/18/20 0406 12/18/20 0600 12/18/20 0800 12/18/20 1111  BP: (!) 97/54 102/66 109/80 118/72  Pulse: 81 71 83 77  Resp: '20 15 18 18  '$ Temp: 97.8 F (36.6 C)   (!) 97.5 F (36.4 C)  TempSrc: Oral   Oral  SpO2: 92% 100% 99% (!) 81%  Weight: 120.3 kg     Height:        Intake/Output Summary (Last 24 hours) at 12/18/2020 1258 Last data filed at 12/18/2020 0400 Gross per 24 hour  Intake 419.65 ml  Output 2275 ml  Net -1855.35 ml   Filed Weights   12/16/20 0316 12/17/20 0512 12/18/20 0406  Weight: 120.1 kg 121.9 kg 120.3 kg    Exam:  General: Appearance:    Obese male in no acute distress     Lungs:     On [place], diminished due to size, respirations unlabored  Heart:    Normal heart rate. Normal rhythm. No murmurs, rubs, or gallops.   MS:   All extremities are intact. +edema but less than prior  Neurologic:   Awake, alert, oriented x 3    Data Reviewed:   I have personally reviewed following labs and imaging studies:  Labs: Labs show the following:   Basic Metabolic Panel: Recent Labs  Lab 12/14/20 1431 12/15/20 0305 12/15/20 2219 12/16/20 0103 12/17/20 0037 12/18/20 0026  NA 132* 133*  --  135 132* 135  K 4.6 4.5  --  3.9 4.1 4.2  CL 91* 92*  --  93* 91* 94*  CO2 24 27  --  '30 29 28  '$ GLUCOSE 102* 86  --  118* 91 107*  BUN 99* 104*  --  109* 112* 111*  CREATININE 5.49* 5.52*  --  5.85* 5.70* 5.38*  CALCIUM 10.6* 10.8*  --  10.5* 10.2 10.9*  PHOS  --   --  7.5* 7.7* 7.2* 6.7*   GFR Estimated Creatinine Clearance: 18.5 mL/min (A) (by C-G formula based on SCr of 5.38 mg/dL (H)). Liver Function Tests: Recent Labs  Lab 12/16/20 0103 12/17/20 0037 12/18/20 0026  ALBUMIN 2.6* 2.9* 3.0*   No results for input(s): LIPASE, AMYLASE in the last 168 hours. No results for input(s): AMMONIA in the last 168 hours. Coagulation profile No results for input(s): INR,  PROTIME in the last 168 hours.  CBC: Recent Labs  Lab 12/14/20 1431 12/15/20 0305 12/17/20 0037 12/18/20 0457  WBC 9.2 9.1 9.2 10.4  HGB 9.2* 9.7* 8.1* 8.8*  HCT 29.6* 30.7* 26.5* 29.0*  MCV 103.1* 105.1* 107.3* 108.2*  PLT 161 205 159 146*   Cardiac Enzymes: No results for input(s): CKTOTAL, CKMB, CKMBINDEX, TROPONINI in the last 168 hours. BNP (last 3 results) No results for input(s): PROBNP in the last 8760 hours. CBG: Recent Labs  Lab 12/16/20 0332  GLUCAP 91   D-Dimer: No results for input(s): DDIMER in the last 72 hours. Hgb A1c: No results for input(s): HGBA1C in the last 72 hours. Lipid Profile: No results for input(s): CHOL, HDL, LDLCALC, TRIG, CHOLHDL,  LDLDIRECT in the last 72 hours. Thyroid function studies: No results for input(s): TSH, T4TOTAL, T3FREE, THYROIDAB in the last 72 hours.  Invalid input(s): FREET3 Anemia work up: Recent Labs    12/17/20 0037  FERRITIN 339*  TIBC 209*  IRON 25*   Sepsis Labs: Recent Labs  Lab 12/14/20 1431 12/15/20 0305 12/17/20 0037 12/18/20 0457  WBC 9.2 9.1 9.2 10.4    Microbiology Recent Results (from the past 240 hour(s))  SARS CORONAVIRUS 2 (TAT 6-24 HRS) Nasopharyngeal Nasopharyngeal Swab     Status: None   Collection Time: 12/14/20  5:47 PM   Specimen: Nasopharyngeal Swab  Result Value Ref Range Status   SARS Coronavirus 2 NEGATIVE NEGATIVE Final    Comment: (NOTE) SARS-CoV-2 target nucleic acids are NOT DETECTED.  The SARS-CoV-2 RNA is generally detectable in upper and lower respiratory specimens during the acute phase of infection. Negative results do not preclude SARS-CoV-2 infection, do not rule out co-infections with other pathogens, and should not be used as the sole basis for treatment or other patient management decisions. Negative results must be combined with clinical observations, patient history, and epidemiological information. The expected result is Negative.  Fact Sheet for  Patients: SugarRoll.be  Fact Sheet for Healthcare Providers: https://www.woods-mathews.com/  This test is not yet approved or cleared by the [place] FDA and  has been authorized for detection and/or diagnosis of SARS-CoV-2 by FDA under an Emergency Use Authorization (EUA). This EUA will remain  in effect (meaning this test can be used) for the duration of the COVID-19 declaration under Se ction 564(b)(1) of the Act, 21 U.S.C. section 360bbb-3(b)(1), unless the authorization is terminated or revoked sooner.  Performed at [facility], [place] [address], [place], [place] [address]   MRSA PCR Screening     Status: Abnormal   Collection Time: 12/15/20 11:30 PM   Specimen: Nasopharyngeal  Result Value Ref Range Status   MRSA by PCR POSITIVE (A) NEGATIVE Final    Comment:        The GeneXpert MRSA Assay (FDA approved for NASAL specimens only), is one component of a comprehensive MRSA colonization surveillance program. It is not intended to diagnose MRSA infection nor to guide or monitor treatment for MRSA infections. RESULT CALLED TO, READ BACK BY AND VERIFIED WITH: HAGERMAN,K RN [id number] 12/16/2020 MITCHELL,L Performed at [facility], [place] [address] [place], [place] [address]     Procedures and diagnostic studies:  No results found.  Medications:   . apixaban  2.5 mg Oral BID  . Chlorhexidine Gluconate Cloth  6 each Topical Q0600  . furosemide  80 mg Intravenous Q12H  . midodrine  15 mg Oral TID WC  . mupirocin ointment  1 application Nasal BID  . sevelamer carbonate  800 mg Oral TID WC  . tamsulosin  0.4 mg Oral Daily   Continuous Infusions:    LOS: 3 days   Geradine Girt  [facility]   How to contact the [facility] Attending or Consulting provider [place] or covering provider during after hours [place], for this patient?  1. Check the care team in [facility] and look for a) attending/consulting TR[facility] provider listed  and b) the [facility] team listed 2. Log into [email or web address] and use [place]'s universal password to access. If you do not have the password, please contact the hospital operator. 3. Locate the [facility] provider you are looking for under [facility] and page to a number that you can  be directly reached. 4. If you still have difficulty reaching the provider, please page the [facility] (Director on Call) for the Hospitalists listed on amion for assistance.  12/18/2020, 12:58 PM

## 2020-12-18 NOTE — Progress Notes (Addendum)
[facility] Progress Note    Assessment/ Plan:    AKI on CKD stage IV: Baseline creatinine appears to be 4.25-4.75 on the most recent hospitalizations, however as outpatient creatinine seems to be around 3.9.  Initial creatinine on presentation of 5.49>5.85>5.70>5.38. BUN increasing from 109>112>111.  Phos elevated at 6.7 but improved from yesterday, albumin 3.0.  I's and O's show 3Lurine output over last 24 hours.   -IV Lasix 60 mg twice daily -Midodrine 15 mg 3 times daily -We will continue to monitor for the need for HD. Patient is vocal about the fact that he doesn't want to have HD unless absolutely necessary.  Uremia: BUN elevated at 109>112 but stable today at 111.   Patient does endorse feeling a bit more "queasy" than he did yesterday which may be due to uremia.  Consider hemodialysis if worsens and if blood pressure tolerates.  Volume Status: Still appears fluid up on exam with 1-2+ pitting edema in the bilateral lower extremities.  Patient has had significant urine output over the past 24 hours after initiating Lasix.  Hypotension: Blood pressure range 97-102003 D4-76 since last night.  Most recent blood pressure 102/66.  Patient does note dizziness. Currently on midodrine 15 mg 3 times per day  Anemia due to CKD: Most recent hemoglobin 8.8.  Baseline appears to be 8-8.5.   Iron studies show iron decreased to 25, TIBC reduced at 209, sat ratio reduced at 12. Home medications include ferrous sulfate 325 daily.  On Eliquis for history of atrial flutter.  No complaints of bleeding at this time.  We will give IV iron.  Acute on chronic diastolic CHF exacerbation: Most recent echo 60-65% in December of last year with mild left ventricular hypertrophy and indeterminate left ventricular diastolic parameters.  Currently on 2L nasal cannula.  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  Calcium elevated at 10.9.  PTH checked in December 2021 shows intact PTH elevated at 214, consistent  with primary hyperparathyroidism. -Continue Renvela.  Urinary retention:  Likely secondary to BPH. Agree with trial of flomax per primary.  Currently with Foley catheter in place. -Every 12 hours bladder scans after Foley is discontinued -Diuresis as above, consider increasing furosemide if blood pressure can tolerate it after starting midodrine  Diet-controlled diabetes: Treatment per primary  Subjective:   Patient states this morning that he feels a bit more queasy than he did yesterday.  He denies any aches or pains and states that he he did not feel confused today but also feels a bit drowsy.  He states he has had significant urine output over the past 24 hours and continues to have Foley catheter in place.  Objective:   BP 102/66   Pulse 71   Temp 97.8 F (36.6 C) (Oral)   Resp 15   Ht '5\' 9"'$  (1.753 m)   Wt 120.3 kg   SpO2 100%   BMI 39.17 kg/m   Intake/Output Summary (Last 24 hours) at 12/18/2020 0745 Last data filed at 12/18/2020 0400 Gross per 24 hour  Intake 1039.65 ml  Output 3025 ml  Net -1985.35 ml   Weight change: -1.6 kg  Physical Exam: General: Alert and oriented in no apparent distress Heart: Regular rate and rhythm, AV fistula left forearm with appropriate bruit and palpable thrill Lungs: Fine crackles present bilateral bases Skin: Warm and dry Extremities:1-2+ pitting edema in bilateral lower extremities  Imaging: No results found.  Labs: BMET Recent Labs  Lab 12/14/20 1431 12/15/20 0305 12/15/20 2219 12/16/20 0103  12/17/20 0037 12/18/20 0026  NA 132* 133*  --  135 132* 135  K 4.6 4.5  --  3.9 4.1 4.2  CL 91* 92*  --  93* 91* 94*  CO2 24 27  --  '30 29 28  '$ GLUCOSE 102* 86  --  118* 91 107*  BUN 99* 104*  --  109* 112* 111*  CREATININE 5.49* 5.52*  --  5.85* 5.70* 5.38*  CALCIUM 10.6* 10.8*  --  10.5* 10.2 10.9*  PHOS  --   --  7.5* 7.7* 7.2* 6.7*   CBC Recent Labs  Lab 12/14/20 1431 12/15/20 0305 12/17/20 0037 12/18/20 0457  WBC 9.2  9.1 9.2 10.4  HGB 9.2* 9.7* 8.1* 8.8*  HCT 29.6* 30.7* 26.5* 29.0*  MCV 103.1* 105.1* 107.3* 108.2*  PLT 161 205 159 146*    Medications:    . apixaban  2.5 mg Oral BID  . Chlorhexidine Gluconate Cloth  6 each Topical Q0600  . furosemide  60 mg Intravenous Q12H  . midodrine  15 mg Oral TID WC  . mupirocin ointment  1 application Nasal BID  . sevelamer carbonate  800 mg Oral TID WC  . tamsulosin  0.4 mg Oral Daily     Lurline Del, DO [facility] Family Medicine Resident, PGY2 12/18/2020, 7:45 AM

## 2020-12-18 NOTE — Evaluation (Signed)
Physical Therapy Evaluation Patient Details Name: Mark [name] MRN: [id number] DOB: [birth year] Today's Date: 12/18/2020   History of Present Illness  Mark [name] is an 60 y.o. male admitted due to worsening dyspnea. Pt with a PMH of chronic atrial flutter on Eliquis, CKD stage V, chronic diastolic CHF (EF 123456 by TTE 10/09/2020), anemia of chronic kidney disease, diet-controlled type 2 diabetes, recurrent left hip dislocation a  Clinical Impression  Pt admitted due to worsening dyspnea. Pt was I prior to admission with intermittent use of cane. Pt states in the last few months L hip has dislocated and required use of cane. Pt lethargic during session, pt requiring O2 and does not require O2 at home. Pt demonstrating deficits in balance, strength, coordination, gait and endurance and will benefit from skilled PT to address deficits to maximzie independence with functional mobility prior to discharge.     Follow Up Recommendations Home health PT;Supervision for mobility/OOB    Equipment Recommendations       Recommendations for Other Services       Precautions / Restrictions Precautions Precautions: Fall Precaution Comments: L hip dislocates Restrictions Weight Bearing Restrictions: No      Mobility  Bed Mobility Overal bed mobility: Needs Assistance Bed Mobility: Supine to Sit     Supine to sit: Supervision;HOB elevated          Transfers Overall transfer level: Needs assistance Equipment used: Rolling walker (2 wheeled) Transfers: Sit to/from [facility] Sit to Stand: Min guard Stand pivot transfers: Min guard          Ambulation/Gait                Stairs            Wheelchair Mobility    Modified Rankin (Stroke Patients Only)       Balance Overall balance assessment: Needs assistance Sitting-balance support: Feet supported;Single extremity supported Sitting balance-Leahy Scale: Fair     Standing balance  support: Bilateral upper extremity supported Standing balance-Leahy Scale: Poor                               Pertinent Vitals/Pain Pain Assessment: No/denies pain    Home Living Family/patient expects to be discharged to:: Private residence Living Arrangements: Alone Available Help at Discharge: Family;Available PRN/intermittently Type of Home: House Home Access: Stairs to enter Entrance Stairs-Rails: None Entrance Stairs-Number of Steps: 3 Home Layout: One level Home Equipment: Grab bars - tub/shower;Shower seat;Cane - quad;Cane - single point;Walker - 2 wheels;Bedside commode Additional Comments: does not use equipment    Prior Function Level of Independence: Independent with assistive device(s)         Comments: uses the cane     Hand Dominance        Extremity/Trunk Assessment   Upper Extremity Assessment Upper Extremity Assessment: Generalized weakness    Lower Extremity Assessment Lower Extremity Assessment: Generalized weakness       Communication   Communication: No difficulties  Cognition Arousal/Alertness: Lethargic Behavior During Therapy: WFL for tasks assessed/performed Overall Cognitive Status: Within Functional Limits for tasks assessed                                        General Comments General comments (skin integrity, edema, etc.): Pt states feels better sitting up in chair. Informed RN of  increased lethargy according to family present in room    Exercises     Assessment/Plan    PT Assessment Patient needs continued PT services  PT Problem List Decreased strength;Decreased mobility;Decreased safety awareness;Decreased coordination;Decreased activity tolerance;Decreased balance       PT Treatment Interventions Therapeutic exercise;Gait training;Balance training;Stair training;Functional mobility training;Therapeutic activities;Patient/family education    PT Goals (Current goals can be found in the  Care Plan section)  Acute Rehab PT Goals Patient Stated Goal: I want to get better to go home PT Goal Formulation: With patient Time For Goal Achievement: 01/01/21 Potential to Achieve Goals: Good    Frequency Min 3X/week   Barriers to discharge        Co-evaluation               AM-PAC PT "6 Clicks" Mobility  Outcome Measure Help needed turning from your back to your side while in a flat bed without using bedrails?: None Help needed moving from lying on your back to sitting on the side of a flat bed without using bedrails?: A Little Help needed moving to and from a bed to a chair (including a wheelchair)?: A Little Help needed standing up from a chair using your arms (e.g., wheelchair or bedside chair)?: A Little Help needed to walk in hospital room?: A Little Help needed climbing 3-5 steps with a railing? : A Lot 6 Click Score: 18    End of Session Equipment Utilized During Treatment: Gait belt;Oxygen Activity Tolerance: Patient tolerated treatment well;Patient limited by lethargy Patient left: in chair;with call bell/phone within reach;with family/visitor present Nurse Communication: Mobility status PT Visit Diagnosis: Unsteadiness on feet (R26.81);Other abnormalities of gait and mobility (R26.89);Muscle weakness (generalized) (M62.81)    Time: [id number] PT Time Calculation (min) (ACUTE ONLY): 13 min   Charges:   PT Evaluation $PT Eval Low Complexity: [place], DPT Acute Rehabilitation Services [id number]  [name] 12/18/2020, 2:14 PM

## 2020-12-19 DIAGNOSIS — I5033 Acute on chronic diastolic (congestive) heart failure: Secondary | ICD-10-CM | POA: Diagnosis not present

## 2020-12-19 LAB — CBC
HCT: 29.2 % — ABNORMAL LOW (ref 39.0–52.0)
Hemoglobin: 9.2 g/dL — ABNORMAL LOW (ref 13.0–17.0)
MCH: 33.3 pg (ref 26.0–34.0)
MCHC: 31.5 g/dL (ref 30.0–36.0)
MCV: 105.8 fL — ABNORMAL HIGH (ref 80.0–100.0)
Platelets: 160 10*3/uL (ref 150–400)
RBC: 2.76 MIL/uL — ABNORMAL LOW (ref 4.22–5.81)
RDW: 16.8 % — ABNORMAL HIGH (ref 11.5–15.5)
WBC: 9.9 10*3/uL (ref 4.0–10.5)
nRBC: 0 % (ref 0.0–0.2)

## 2020-12-19 LAB — RENAL FUNCTION PANEL
Albumin: 3.2 g/dL — ABNORMAL LOW (ref 3.5–5.0)
Anion gap: 14 (ref 5–15)
BUN: 107 mg/dL — ABNORMAL HIGH (ref 8–23)
CO2: 29 mmol/L (ref 22–32)
Calcium: 11.1 mg/dL — ABNORMAL HIGH (ref 8.9–10.3)
Chloride: 93 mmol/L — ABNORMAL LOW (ref 98–111)
Creatinine, Ser: 4.94 mg/dL — ABNORMAL HIGH (ref 0.61–1.24)
GFR, Estimated: 13 mL/min — ABNORMAL LOW (ref >60)
Glucose, Bld: 82 mg/dL (ref 70–99)
Phosphorus: 6.5 mg/dL — ABNORMAL HIGH (ref 2.5–4.6)
Potassium: 4.1 mmol/L (ref 3.5–5.1)
Sodium: 136 mmol/L (ref 135–145)

## 2020-12-19 MED ORDER — CALCITONIN (SALMON) 200 UNIT/ACT NA SOLN
1.0000 | Freq: Every day | NASAL | Status: AC
  Administered 2020-12-19 – 2020-12-20 (×2): 1 via NASAL
  Filled 2020-12-19: qty 3.7

## 2020-12-19 MED ORDER — METOPROLOL TARTRATE 12.5 MG HALF TABLET
12.5000 mg | ORAL_TABLET | Freq: Two times a day (BID) | ORAL | Status: DC
  Administered 2020-12-19 – 2020-12-23 (×8): 12.5 mg via ORAL
  Filled 2020-12-19 (×9): qty 1

## 2020-12-19 MED ORDER — APIXABAN 5 MG PO TABS
5.0000 mg | ORAL_TABLET | Freq: Two times a day (BID) | ORAL | Status: DC
  Administered 2020-12-19 – 2020-12-23 (×8): 5 mg via ORAL
  Filled 2020-12-19 (×8): qty 1

## 2020-12-19 NOTE — Progress Notes (Addendum)
[facility] Progress Note    Assessment/ Plan:    AKI on CKD stage IV: Baseline creatinine appears to be 4.25-4.75 on the most recent hospitalizations, however as outpatient creatinine seems to be around 3.9.  Initial creatinine on presentation of 5.49>5.85>5.70>5.38>4.94. BUN improving from 111 to 107.Marland Kitchen  Phos elevated at 6.5 but improved from yesterday, albumin 3.2.  I's and O's show 4L urine output over last 24 hours.   -Continue IV Lasix 80 mg twice daily -Strict I's and O's -Midodrine 15 mg 3 times daily -We will continue to monitor for the need for HD. Patient is vocal about the fact that he doesn't want to have HD unless absolutely necessary.  Uremia: BUN elevated at 109>112>107.  States that his nausea and dizziness have improved from yesterday.  Consider hemodialysis if worsens and if blood pressure tolerates.  Volume Status: Still appears volume overloaded with 1+ pitting edema of the bilateral lower extremities.  Patient has had significant urine output over the past 24 hours after initiating Lasix.  Hypotension: Blood pressure range 103-120/73-76 since last night.  Most recent blood pressure 120/76. Currently on midodrine 15 mg 3 times per day  Anemia due to CKD: Most recent hemoglobin 9.2.  Baseline appears to be 8-8.5.   Iron studies show iron decreased to 25, TIBC reduced at 209, sat ratio reduced at 12. Home medications include ferrous sulfate 325 daily.  On Eliquis for history of atrial flutter.  No complaints of bleeding at this time.  We will give IV iron.  Acute on chronic diastolic CHF exacerbation: Most recent echo 60-65% in December of last year with mild left ventricular hypertrophy and indeterminate left ventricular diastolic parameters.  Currently on 2L nasal cannula.  CKD MBD: Phosphorus of 6.5.  Calcium elevated at 11.1.  PTH checked in December 2021 shows intact PTH elevated at 214, consistent with primary hyperparathyroidism.  -Continue  Renvela.  Urinary retention:  Likely secondary to BPH. Agree with trial of flomax per primary.  Currently with Foley catheter in place. -Every 12 hours bladder scans after Foley is discontinued -Diuresis as above, consider increasing furosemide if blood pressure can tolerate it after starting midodrine  Diet-controlled diabetes: Treatment per primary  Subjective:   Patient states he feels better today.  He denies feeling nauseated or queasy as he stated yesterday, he states his dizziness has also improved somewhat.    Objective:   BP 120/76 (BP Location: Right Arm)   Pulse 81   Temp 97.7 F (36.5 C) (Oral)   Resp 16   Ht '5\' 9"'$  (1.753 m)   Wt 115.6 kg   SpO2 95%   BMI 37.64 kg/m   Intake/Output Summary (Last 24 hours) at 12/19/2020 0641 Last data filed at 12/19/2020 0256 Gross per 24 hour  Intake -  Output 4075 ml  Net -4075 ml   Weight change: -4.7 kg  Physical Exam: General: Alert and oriented in no apparent distress Heart: Regular rate, AV fistula left forearm with appropriate bruit and palpable thrill. Lungs: Some fine crackles present in bilateral bases Skin: Warm and dry Extremities: 1+ pitting edema of the bilateral lower extremity   Imaging: No results found.  Labs: BMET Recent Labs  Lab 12/14/20 1431 12/15/20 0305 12/15/20 2219 12/16/20 0103 12/17/20 0037 12/18/20 0026 12/19/20 0006  NA 132* 133*  --  135 132* 135 136  K 4.6 4.5  --  3.9 4.1 4.2 4.1  CL 91* 92*  --  93* 91* 94* 93*  CO2 24 27  --  '30 29 28 29  '$ GLUCOSE 102* 86  --  118* 91 107* 82  BUN 99* 104*  --  109* 112* 111* 107*  CREATININE 5.49* 5.52*  --  5.85* 5.70* 5.38* 4.94*  CALCIUM 10.6* 10.8*  --  10.5* 10.2 10.9* 11.1*  PHOS  --   --  7.5* 7.7* 7.2* 6.7* 6.5*   CBC Recent Labs  Lab 12/15/20 0305 12/17/20 0037 12/18/20 0457 12/19/20 0006  WBC 9.1 9.2 10.4 9.9  HGB 9.7* 8.1* 8.8* 9.2*  HCT 30.7* 26.5* 29.0* 29.2*  MCV 105.1* 107.3* 108.2* 105.8*  PLT 205 159 146* 160     Medications:    . apixaban  2.5 mg Oral BID  . Chlorhexidine Gluconate Cloth  6 each Topical Q0600  . furosemide  80 mg Intravenous Q12H  . midodrine  15 mg Oral TID WC  . mupirocin ointment  1 application Nasal BID  . sevelamer carbonate  800 mg Oral TID WC  . tamsulosin  0.4 mg Oral Daily     Lurline Del, DO [facility] Family Medicine Resident, PGY2 12/19/2020, 6:41 AM

## 2020-12-19 NOTE — TOC Initial Note (Signed)
Transition of Care [facility]) - Initial/Assessment Note    Patient Details  Name: Mark [name] MRN: [id number] Date of Birth: April 21, 1961  Transition of Care [facility]) CM/SW Contact:    Zenon Mayo, RN Phone Number: 12/19/2020, 4:47 PM  Clinical Narrative:                 Patient is from home alone.  He states his brother will transport him home at dc. NCM offered choice, he states Al[facility] Lemmings will be fine.  NCM made referral to [facility] . He can take referral . Soc will begin 24 to 48 hrs post dc.    Expected Discharge Plan: [place] Barriers to Discharge: Continued Medical Work up   Patient Goals and CMS Choice Patient states their goals for this hospitalization and ongoing recovery are:: get better CMS Medicare.gov Compare Post Acute Care list provided to:: Patient Choice offered to / list presented to : Patient  Expected Discharge Plan and Services Expected Discharge Plan: [place] In-house Referral: NA Discharge Planning Services: CM Consult Post Acute Care Choice: [place] arrangements for the past 2 months: Single Family Home                   DME Agency: NA       HH Arranged: PT HH Agency: [place] Date [facility] Agency Contacted: 12/19/20 Time HH Agency Contacted: 1647 Representative spoke with at [place]: Tommi Rumps  Prior Living Arrangements/Services Living arrangements for the past 2 months: Single Family Home Lives with:: Self Patient language and need for interpreter reviewed:: Yes Do you feel safe going back to the place where you live?: Yes      Need for Family Participation in Patient Care: No (Comment) Care giver support system in place?: No (comment) Current home services: DME (has walker, cane , l3 n 1,) Criminal Activity/Legal Involvement Pertinent to Current Situation/Hospitalization: No - Comment as needed  Activities of Daily Living      Permission Sought/Granted                   Emotional Assessment Appearance:: Appears stated age Attitude/Demeanor/Rapport: Engaged Affect (typically observed): Appropriate Orientation: : Oriented to Self,Oriented to Place,Oriented to  Time,Oriented to Situation   Psych Involvement: No (comment)  Admission diagnosis:  CHF (congestive heart failure) ([place]) [I50.9] Acute on chronic diastolic (congestive) heart failure ([place]) [I50.33] Acute on chronic congestive heart failure, unspecified heart failure type ([place]) [I50.9] Patient Active Problem List   Diagnosis Date Noted  . 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 Hyperphosphatemia 12/16/2020  . Acute metabolic encephalopathy [id number]  . Acquired thrombophilia ([place]) 12/16/2020  . Hyponatremia 12/15/2020  . Hypercalcemia 12/15/2020  . Anemia of chronic disease 12/15/2020  . Acute on chronic diastolic (congestive) heart failure ([place]) 12/14/2020  . Atrial flutter ([place]) 10/08/2020  . Closed dislocation of left hip ([place]) 07/26/2020  . CKD (chronic kidney disease), stage IV ([place]) 07/26/2020  . Gout 07/26/2020  . Diverticula of colon 04/29/2020  . Acute renal failure superimposed on chronic kidney disease ([place]) 04/29/2020  . Diverticulitis   . 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 Benign essential HTN 10/09/2011  . Post-traumatic osteoarthritis of left hip 10/06/2011   PCP:  Delrae Rend, MD Pharmacy:   [id number] [id number]: [phone]  Fax: [phone]     Social Determinants of Health (SDOH) Interventions    Readmission Risk Interventions No flowsheet data found.

## 2020-12-19 NOTE — Progress Notes (Signed)
ANTICOAGULATION CONSULT NOTE - Initial Consult  Pharmacy Consult for apixaban Indication: atrial fibrillation  Allergies  Allergen Reactions  . Penicillins Swelling, Rash and Other (See Comments)    Arms and eyes swell & skin breaks out  Has patient had a PCN reaction causing immediate rash, facial/tongue/throat swelling, SOB or lightheadedness with hypotension: Yes Has patient had a PCN reaction causing severe rash involving mucus membranes or skin necrosis: Yes Has patient had a PCN reaction that required hospitalization: No Has patient had a PCN reaction occurring within the last 10 years: No If all of the above answers are "NO", then may proceed with Cephalosporin use.   . Atorvastatin     Other reaction(s): Unknown  . Penicillin G     Unknown  . Rosuvastatin     Other reaction(s): Unknown    Patient Measurements: Height: '5\' 9"'$  (175.3 cm) Weight: 115.6 kg (254 lb 13.6 oz) IBW/kg (Calculated) : 70.7   Vital Signs: Temp: 98 F (36.7 C) (02/11 0753) Temp Source: Oral (02/11 0753) BP: 111/84 (02/11 0753) Pulse Rate: 99 (02/11 0753)  Labs: Recent Labs    12/17/20 0037 12/18/20 0026 12/18/20 0457 12/19/20 0006  HGB 8.1*  --  8.8* 9.2*  HCT 26.5*  --  29.0* 29.2*  PLT 159  --  146* 160  CREATININE 5.70* 5.38*  --  4.94*    Estimated Creatinine Clearance: 19.7 mL/min (A) (by C-G formula based on SCr of 4.94 mg/dL (H)).   Medical History: Past Medical History:  Diagnosis Date  . Anemia    labs today indicate (09/27/11)  . Arthritis    right knee  . Bleeding ulcer    hx of, required 6 units blood  . Blood transfusion    6 units with ulcer repair surgery  . Chronic kidney disease    stage IV, has fistula, never started diaylsis  . Complication of anesthesia    little slow to wake up with last surgery  . Dyspnea    with exertion  . Gout   . Hip dislocation, left ([place]) 10/09/2020  . Hypertension   . Osteoporosis   . Pneumonia        Assessment: 59yom admitted with volume overload.  Hx Afib in SR - PTA metoprolol on hold for hypotension.   Apixaban '5mg'$  BID pta for anticoagulation dose dropped on admit with Cr 5.9> improved 4.9 close to  BL 4.5  Wt >60kg, age<80yo will resume PTA apixaban dose as pt does not meet criteria for dose reduction.  H/h stable no bleeding noted.   Goal of Therapy:  Monitor platelets by anticoagulation protocol: Yes   Plan:  Increase apixaban '5mg'$  BID Monitor s/s bleeding CBC and renal function daily   Bonnita Nasuti Pharm.D. CPP, BCPS Clinical Pharmacist [phone] 12/19/2020 9:57 AM

## 2020-12-19 NOTE — Plan of Care (Signed)

## 2020-12-19 NOTE — Progress Notes (Signed)
Progress Note    Mark [name]  [id number] DOB: Oct 31, 1961  DOA: 12/14/2020 PCP: Delrae Rend, MD    Brief Narrative:     Medical records reviewed and are as summarized below:  Mark [name] is an 60 y.o. male with a PMH ofchronic atrial flutter on Eliquis, CKD stage V, chronic diastolic CHF (EF 123456 by TTE 10/09/2020), anemia of chronic kidney disease, diet-controlled type 2 diabetes, recurrent left hip dislocationwho presents to the ED via EMS for evaluation of a 5 day h/o worsening dyspnea.  chest x-ray showed cardiomegaly with elevation of left hemidiaphragm and streaky airspace opacities in the lingula. The patient was given IV Lasix 80 mg once and the hospitalist service was consulted to admit for further evaluation and management. Cardiology and nephrology have been consulted to assist with management. Cardiology feels nothing to add given normal EF and advanced renal disease and prefers to let nephrology manage volume status issues.   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  -nephrology diuresing -no need for urgent HD -started on midodrine for BP Support so diuretics can be given  Acute respiratory failure -wean to room air -81% documented on RA prior -mobilize  Urinary retention, Acute,  POA -foley placed 2/8 for acute urinary retention -voiding trial today -flomax  MRSA Carrier, POA Decontamination prescribed.  AKI on CKD stage IV, POA Creatinine elevated compared to recent baseline, suspected to be related to hypervolemia. Continue Lasix diuresis as above and monitor closely. Patient has a left wrist aVF placed in 2017 but has not required initiation of dialysis. -nephrology consult appreciated  Chronic atrialflutter/acquired thrombophilia, POA Remains in atrial flutter with controlled rate. Continue reduced dose Eliquis (2.5 mg) twice daily given degree of renal dysfunction.  -holding Toprol for low BP  Anemia of chronic kidney disease, chronic, POA Chronic and stable. Patient reported seeing some minor bleeding several days ago therefore he held his Eliquis. increase eliquis to pharmacy recommend dose  Diet controlled DM with renal complications, POA Chronic, controlled.  Hypercalcemia/hyperphosphatemia, chronic, related to CKD, POA Likely from secondary hyperparathyroidism related to CKD. Phosphorus elevated.   sevelamer     Benign essential HTN, chronic, POA BP low.  D/C metoprolol for now.  Obesity Estimated body mass index is 37.64 kg/m as calculated from the following:   Height as of this encounter: '5\' 9"'$  (1.753 m).   Weight as of this encounter: 115.6 kg.  -prob need outpatient sleep apnea study   Family Communication/Anticipated D/C date and plan/Code Status   DVT prophylaxis: eliquis Code Status: Full Code.  Disposition Plan: Status is: Inpatient  Remains inpatient appropriate because:Inpatient level of care appropriate due to severity of illness   Dispo: The patient is from: Home              Anticipated d/c is to: Home  Anticipated d/c date is: > 3 days              Patient currently is not medically stable to d/c.   Difficult to place patient No   Medical Consultants:    renal.    Subjective:    Slept in chair last night  Objective:    Vitals:   12/18/20 2244 12/19/20 0255 12/19/20 0523 12/19/20 0753  BP: 114/78 120/76  111/84  Pulse: 81 81  99  Resp: '20 16  18  '$ Temp: 97.7 F (36.5 C) 97.7 F (36.5 C)  98 F (36.7 C)  TempSrc: Oral Oral  Oral  SpO2: 99% 95%  100%  Weight:   115.6 kg   Height:        Intake/Output Summary (Last 24 hours) at 12/19/2020 1104 Last data filed at 12/19/2020 0256 Gross per 24 hour  Intake -  Output 4075 ml  Net -4075 ml   Filed Weights   12/17/20 0512 12/18/20 0406 12/19/20 0523  Weight: 121.9 kg 120.3 kg 115.6 kg    Exam:  General: Appearance:    Obese male in no acute distress, sitting in chair   Foley in place- pink tinged urine  Lungs:     Diminished due to size, respirations unlabored  Heart:    Normal heart rate. Normal rhythm. No murmurs, rubs, or gallops.   MS:   All extremities are intact.   Neurologic:   Awake, alert, oriented x 3.      Data Reviewed:   I have personally reviewed following labs and imaging studies:  Labs: Labs show the following:   Basic Metabolic Panel: Recent Labs  Lab 12/15/20 0305 12/15/20 2219 12/16/20 0103 12/17/20 0037 12/18/20 0026 12/19/20 0006  NA 133*  --  135 132* 135 136  K 4.5  --  3.9 4.1 4.2 4.1  CL 92*  --  93* 91* 94* 93*  CO2 27  --  '30 29 28 29  '$ GLUCOSE 86  --  118* 91 107* 82  BUN 104*  --  109* 112* 111* 107*  CREATININE 5.52*  --  5.85* 5.70* 5.38* 4.94*  CALCIUM 10.8*  --  10.5* 10.2 10.9* 11.1*  PHOS  --  7.5* 7.7* 7.2* 6.7* 6.5*   GFR Estimated Creatinine Clearance: 19.7 mL/min (A) (by C-G formula based on SCr of 4.94 mg/dL (H)). Liver Function Tests: Recent Labs  Lab 12/16/20 0103 12/17/20 0037 12/18/20 0026 12/19/20 0006  ALBUMIN 2.6* 2.9* 3.0* 3.2*   No results for input(s): LIPASE, AMYLASE in the last 168 hours. No results for input(s): AMMONIA in the last 168 hours. Coagulation profile No results for input(s): INR, PROTIME in the last 168  hours.  CBC: Recent Labs  Lab 12/14/20 1431 12/15/20 0305 12/17/20 0037 12/18/20 0457 12/19/20 0006  WBC 9.2 9.1 9.2 10.4 9.9  HGB 9.2* 9.7* 8.1* 8.8* 9.2*  HCT 29.6* 30.7* 26.5* 29.0* 29.2*  MCV 103.1* 105.1* 107.3* 108.2* 105.8*  PLT 161 205 159 146* 160   Cardiac Enzymes: No results for input(s): CKTOTAL, CKMB, CKMBINDEX, TROPONINI in the last 168 hours. BNP (last 3 results) No results for input(s): PROBNP in the last 8760 hours. CBG: Recent Labs  Lab 12/16/20 0332  GLUCAP 91   D-Dimer: No results for input(s): DDIMER in the last 72 hours. Hgb A1c: No results for input(s): HGBA1C in the last 72 hours. Lipid Profile: No results for input(s): CHOL, HDL, LDLCALC, TRIG, CHOLHDL, LDLDIRECT in the last 72 hours.  Thyroid function studies: No results for input(s): TSH, T4TOTAL, T3FREE, THYROIDAB in the last 72 hours.  Invalid input(s): FREET3 Anemia work up: Recent Labs    12/17/20 0037  FERRITIN 339*  TIBC 209*  IRON 25*   Sepsis Labs: Recent Labs  Lab 12/15/20 0305 12/17/20 0037 12/18/20 0457 12/19/20 0006  WBC 9.1 9.2 10.4 9.9    Microbiology Recent Results (from the past 240 hour(s))  SARS CORONAVIRUS 2 (TAT 6-24 HRS) Nasopharyngeal Nasopharyngeal Swab     Status: None   Collection Time: 12/14/20  5:47 PM   Specimen: Nasopharyngeal Swab  Result Value Ref Range Status   SARS Coronavirus 2 NEGATIVE NEGATIVE Final    Comment: (NOTE) SARS-CoV-2 target nucleic acids are NOT DETECTED.  The SARS-CoV-2 RNA is generally detectable in upper and lower respiratory specimens during the acute phase of infection. Negative results do not preclude SARS-CoV-2 infection, do not rule out co-infections with other pathogens, and should not be used as the sole basis for treatment or other patient management decisions. Negative results must be combined with clinical observations, patient history, and epidemiological information. The expected result is Negative.  Fact  Sheet for Patients: SugarRoll.be  Fact Sheet for Healthcare Providers: https://www.woods-mathews.com/  This test is not yet approved or cleared by the [place] FDA and  has been authorized for detection and/or diagnosis of SARS-CoV-2 by FDA under an Emergency Use Authorization (EUA). This EUA will remain  in effect (meaning this test can be used) for the duration of the COVID-19 declaration under Se ction 564(b)(1) of the Act, 21 U.S.C. section 360bbb-3(b)(1), unless the authorization is terminated or revoked sooner.  Performed at [facility], [place] [address], [place], [place] [address]   MRSA PCR Screening     Status: Abnormal   Collection Time: 12/15/20 11:30 PM   Specimen: Nasopharyngeal  Result Value Ref Range Status   MRSA by PCR POSITIVE (A) NEGATIVE Final    Comment:        The GeneXpert MRSA Assay (FDA approved for NASAL specimens only), is one component of a comprehensive MRSA colonization surveillance program. It is not intended to diagnose MRSA infection nor to guide or monitor treatment for MRSA infections. RESULT CALLED TO, READ BACK BY AND VERIFIED WITH: HAGERMAN,K RN [id number] 12/16/2020 MITCHELL,L Performed at [facility], [place] [address], [place], [place] [address]     Procedures and diagnostic studies:  No results found.  Medications:   . apixaban  5 mg Oral BID  . Chlorhexidine Gluconate Cloth  6 each Topical Q0600  . furosemide  80 mg Intravenous Q12H  . midodrine  15 mg Oral TID WC  . mupirocin ointment  1 application Nasal BID  . sevelamer carbonate  800 mg Oral TID WC  . tamsulosin  0.4 mg Oral Daily   Continuous Infusions:    LOS: 4 days   Geradine Girt  [facility]   How to contact the [facility] Attending or Consulting provider [place] or covering provider during after hours [place], for this patient?  1. Check the care team in [facility] and look for a) attending/consulting [facility] provider  listed and b) the [facility] team listed 2. Log into [email or web address] and use [place]'s universal password to access. If you do not have the password, please contact the hospital operator. 3. Locate the [facility] provider you are looking for under [facility] and page to a number that you can be directly reached. 4. If you  still have difficulty reaching the provider, please page the [facility] (Director on Call) for the Hospitalists listed on amion for assistance.  12/19/2020, 11:04 AM

## 2020-12-20 DIAGNOSIS — I5033 Acute on chronic diastolic (congestive) heart failure: Secondary | ICD-10-CM | POA: Diagnosis not present

## 2020-12-20 LAB — RENAL FUNCTION PANEL
Albumin: 3.2 g/dL — ABNORMAL LOW (ref 3.5–5.0)
Anion gap: 15 (ref 5–15)
BUN: 100 mg/dL — ABNORMAL HIGH (ref 8–23)
CO2: 30 mmol/L (ref 22–32)
Calcium: 11.5 mg/dL — ABNORMAL HIGH (ref 8.9–10.3)
Chloride: 92 mmol/L — ABNORMAL LOW (ref 98–111)
Creatinine, Ser: 4.7 mg/dL — ABNORMAL HIGH (ref 0.61–1.24)
GFR, Estimated: 13 mL/min — ABNORMAL LOW (ref >60)
Glucose, Bld: 94 mg/dL (ref 70–99)
Phosphorus: 5 mg/dL — ABNORMAL HIGH (ref 2.5–4.6)
Potassium: 3.7 mmol/L (ref 3.5–5.1)
Sodium: 137 mmol/L (ref 135–145)

## 2020-12-20 NOTE — Plan of Care (Signed)

## 2020-12-20 NOTE — Progress Notes (Signed)
Progress Note    Mark [name]  [id number] DOB: [birth year]  DOA: 12/14/2020 PCP: Delrae Rend, MD    Brief Narrative:     Medical records reviewed and are as summarized below:  Mark [name] is an 60 y.o. male with a PMH ofchronic atrial flutter on Eliquis, CKD stage V, chronic diastolic CHF (EF 123456 by TTE 10/09/2020), anemia of chronic kidney disease, diet-controlled type 2 diabetes, recurrent left hip dislocationwho presents to the ED via EMS for evaluation of a 5 day h/o worsening dyspnea.  chest x-ray showed cardiomegaly with elevation of left hemidiaphragm and streaky airspace opacities in the lingula. The patient was given IV Lasix 80 mg once and the hospitalist service was consulted to admit for further evaluation and management. Cardiology and nephrology have been consulted to assist with management. Cardiology feels nothing to add given normal EF and advanced renal disease and prefers to let nephrology manage volume status issues.   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  -nephrology diuresing -no need for urgent HD -started on midodrine for BP Support so diuretics can be given  Acute respiratory failure -wean to room air -81% documented on RA prior -mobilize  Urinary retention, Acute,  POA -foley placed 2/8 for acute urinary retention and removed 2/11 -flomax  MRSA Carrier, POA Decontamination prescribed.  AKI on CKD stage IV, POA Creatinine elevated compared to recent baseline, suspected to be related to hypervolemia. Continue Lasix diuresis as above and monitor closely. Patient has a left wrist aVF placed in 2017 but has not required initiation of dialysis. -nephrology consult appreciated  Chronic atrialflutter/acquired thrombophilia, POA Remains in atrial flutter with controlled rate.  -continue eliquis at appropriate dose-- monitor for bleeding- per last note from cardiology: - would resume eliquis for now given attempt to cardiovert upcoming. With renal disease and history of bleeding ulcer requiring transfusion, may be difficult to anticoagulate ongoing. Will follow closely. Consider referral to Afib clinic. Will see him back in 1 mo to ensure still in afib and compliance with AC. - continue metoprolol succinate. Rates well controlled. - This patients CHA2DS2-VASc Score and unadjusted Ischemic Stroke Rate (% per year) is equal to 0.6 % stroke rate/year from a score of 1 Above score calculated as 1 point each if present [CHF, HTN, DM, Vascular=MI/PAD/Aortic Plaque, Age if 65-74, or Male] Above score calculated as 2 points each if present [Age > 75, or Stroke/TIA/TE] -resumed low dose metoprolol  Anemia of chronic kidney disease, chronic, POA Chronic and stable. Patient reported seeing some minor bleeding several days ago therefore he held his Eliquis. increase eliquis to pharmacy recommend dose  Diet controlled DM with renal complications, POA Chronic, controlled.  Hypercalcemia/hyperphosphatemia, chronic, related to CKD, POA Likely from secondary hyperparathyroidism related to CKD. Phosphorus elevated.   sevelamer     Benign essential HTN, chronic, POA -  monitor closely on low dose metoprolol   Obesity Estimated body mass index is 36.69 kg/m  as calculated from the following:   Height as of this encounter: '5\' 9"'$  (1.753 m).   Weight as of this encounter: 112.7 kg.  -prob need outpatient sleep apnea study   Family Communication/Anticipated D/C date and plan/Code Status   DVT prophylaxis: eliquis Code Status: Full Code.  Disposition Plan: Status is: Inpatient  Remains inpatient appropriate because:Inpatient level of care appropriate due to severity of illness   Dispo: The patient is from: Home              Anticipated d/c is to: Home              Anticipated d/c date is: > 3 days              Patient currently is not medically stable to d/c. still getting IV lasix   Difficult to place patient No   Medical Consultants:    renal.    Subjective:   Say he saw some blood in his mucous when he coughed   Objective:    Vitals:   12/20/20 0341 12/20/20 0602 12/20/20 0754 12/20/20 1027  BP: (!) 125/93  111/82 (!) 113/91  Pulse: (!) 105  (!) 113 (!) 110  Resp: '20  20 19  '$ Temp: 97.8 F (36.6 C)  98.6 F (37 C) (!) 97.4 F (36.3 C)  TempSrc: Oral  Oral Oral  SpO2: 96%  100% 100%  Weight:  112.7 kg    Height:        Intake/Output Summary (Last 24 hours) at 12/20/2020 1116 Last data filed at 12/20/2020 1000 Gross per 24 hour  Intake 600 ml  Output 4950 ml  Net -4350 ml   Filed Weights   12/18/20 0406 12/19/20 0523 12/20/20 0602  Weight: 120.3 kg 115.6 kg 112.7 kg    Exam:  General: Appearance:    Obese male in no acute distress     Lungs:     respirations unlabored  Heart:    Tachycardic. Normal rhythm. No murmurs, rubs, or gallops.   MS:   All extremities are intact. +LE edema  Neurologic:   Awake, alert, oriented x 3. No apparent focal neurological           defect.       Data Reviewed:   I have personally reviewed following labs and imaging studies:  Labs: Labs show the following:   Basic Metabolic Panel: Recent Labs  Lab 12/16/20 0103 12/17/20 0037 12/18/20 0026 12/19/20 0006  12/20/20 0147  NA 135 132* 135 136 137  K 3.9 4.1 4.2 4.1 3.7  CL 93* 91* 94* 93* 92*  CO2 '30 29 28 29 30  '$ GLUCOSE 118* 91 107* 82 94  BUN 109* 112* 111* 107* 100*  CREATININE 5.85* 5.70* 5.38* 4.94* 4.70*  CALCIUM 10.5* 10.2 10.9* 11.1* 11.5*  PHOS 7.7* 7.2* 6.7* 6.5* 5.0*   GFR Estimated Creatinine Clearance: 20.4 mL/min (A) (by C-G formula based on SCr of 4.7 mg/dL (H)). Liver Function Tests: Recent Labs  Lab 12/16/20 0103 12/17/20 0037 12/18/20 0026 12/19/20 0006 12/20/20 0147  ALBUMIN 2.6* 2.9* 3.0* 3.2* 3.2*   No results for input(s): LIPASE, AMYLASE in the last 168 hours. No results for input(s): AMMONIA in the last 168 hours. Coagulation profile No results for input(s): INR, PROTIME in the last 168 hours.  CBC: Recent Labs  Lab 12/14/20 1431 12/15/20 0305 12/17/20 0037 12/18/20 [id number]  12/19/20 0006  WBC 9.2 9.1 9.2 10.4 9.9  HGB 9.2* 9.7* 8.1* 8.8* 9.2*  HCT 29.6* 30.7* 26.5* 29.0* 29.2*  MCV 103.1* 105.1* 107.3* 108.2* 105.8*  PLT 161 205 159 146* 160   Cardiac Enzymes: No results for input(s): CKTOTAL, CKMB, CKMBINDEX, TROPONINI in the last 168 hours. BNP (last 3 results) No results for input(s): PROBNP in the last 8760 hours. CBG: Recent Labs  Lab 12/16/20 0332  GLUCAP 91   D-Dimer: No results for input(s): DDIMER in the last 72 hours. Hgb A1c: No results for input(s): HGBA1C in the last 72 hours. Lipid Profile: No results for input(s): CHOL, HDL, LDLCALC, TRIG, CHOLHDL, LDLDIRECT in the last 72 hours. Thyroid function studies: No results for input(s): TSH, T4TOTAL, T3FREE, THYROIDAB in the last 72 hours.  Invalid input(s): FREET3 Anemia work up: No results for input(s): VITAMINB12, FOLATE, FERRITIN, TIBC, IRON, RETICCTPCT in the last 72 hours. Sepsis Labs: Recent Labs  Lab 12/15/20 0305 12/17/20 0037 12/18/20 0457 12/19/20 0006  WBC 9.1 9.2 10.4 9.9    Microbiology Recent Results (from the past 240 hour(s))  SARS CORONAVIRUS 2  (TAT 6-24 HRS) Nasopharyngeal Nasopharyngeal Swab     Status: None   Collection Time: 12/14/20  5:47 PM   Specimen: Nasopharyngeal Swab  Result Value Ref Range Status   SARS Coronavirus 2 NEGATIVE NEGATIVE Final    Comment: (NOTE) SARS-CoV-2 target nucleic acids are NOT DETECTED.  The SARS-CoV-2 RNA is generally detectable in upper and lower respiratory specimens during the acute phase of infection. Negative results do not preclude SARS-CoV-2 infection, do not rule out co-infections with other pathogens, and should not be used as the sole basis for treatment or other patient management decisions. Negative results must be combined with clinical observations, patient history, and epidemiological information. The expected result is Negative.  Fact Sheet for Patients: SugarRoll.be  Fact Sheet for Healthcare Providers: https://www.woods-mathews.com/  This test is not yet approved or cleared by the [place] FDA and  has been authorized for detection and/or diagnosis of SARS-CoV-2 by FDA under an Emergency Use Authorization (EUA). This EUA will remain  in effect (meaning this test can be used) for the duration of the COVID-19 declaration under Se ction 564(b)(1) of the Act, 21 U.S.C. section 360bbb-3(b)(1), unless the authorization is terminated or revoked sooner.  Performed at [facility], [place] [address], [place], [place] [address]   MRSA PCR Screening     Status: Abnormal   Collection Time: 12/15/20 11:30 PM   Specimen: Nasopharyngeal  Result Value Ref Range Status   MRSA by PCR POSITIVE (A) NEGATIVE Final    Comment:        The GeneXpert MRSA Assay (FDA approved for NASAL specimens only), is one component of a comprehensive MRSA colonization surveillance program. It is not intended to diagnose MRSA infection nor to guide or monitor treatment for MRSA infections. RESULT CALLED TO, READ BACK BY AND VERIFIED  WITH: HAGERMAN,K RN [id number] 12/16/2020 MITCHELL,L Performed at [facility], [place] [address], [place], [place] [address]     Procedures and diagnostic studies:  No results found.  Medications:   . apixaban  5 mg Oral BID  . furosemide  80 mg Intravenous Q12H  . metoprolol tartrate  12.5 mg Oral BID  . midodrine  15 mg Oral TID WC  . mupirocin ointment  1 application Nasal BID  . sevelamer carbonate  800 mg Oral TID WC  . tamsulosin  0.4 mg Oral  Daily   Continuous Infusions:    LOS: 5 days   Geradine Girt  [facility]   How to contact the [facility] Attending or Consulting provider [place] or covering provider during after hours [place], for this patient?  1. Check the care team in [facility] and look for a) attending/consulting [facility] provider listed and b) the [facility] team listed 2. Log into [email or web address] and use [place]'s universal password to access. If you do not have the password, please contact the hospital operator. 3. Locate the [facility] provider you are looking for under [facility] and page to a number that you can be directly reached. 4. If you still have difficulty reaching the provider, please page the [facility] (Director on Call) for the Hospitalists listed on amion for assistance.  12/20/2020, 11:16 AM

## 2020-12-20 NOTE — Progress Notes (Signed)
[facility] Progress Note    Assessment/ Plan:    AKI on CKD stage IV: Baseline creatinine appears to be 4.25-4.75 on the most recent hospitalizations, however as outpatient creatinine seems to be around 3.9.  Initial creatinine on presentation of 5.49>5.85>5.70>5.38>4.94>4.70. BUN improving from 111 to 100.  Phos elevated at 5.0 but improved from yesterday, albumin 3.2.  I's and O's show 3.9L urine output over last 24 hours.   -Continue IV Lasix 80 mg twice daily -Strict I's and O's -Midodrine 15 mg 3 times daily -We will continue to monitor for the need for HD. Patient is vocal about the fact that he doesn't want to have HD unless absolutely necessary.  Uremia: BUN elevated at 109>112>107> 100.  No complaints of nausea.  Consider hemodialysis if worsens and if blood pressure tolerates.  Volume Status: Still appears volume overloaded 1+ pitting edema in the lower extremities but lungs clear to auscultation today.  Patient has had significant urine output over the past 24 hours  Hypotension: Blood pressure range 114-125/83-93 since last night.  Most recent blood pressure 125/93. Currently on midodrine 15 mg 3 times per day  Anemia due to CKD: Most recent hemoglobin 9.2.  Baseline appears to be 8-8.5.   Iron studies show iron decreased to 25, TIBC reduced at 209, sat ratio reduced at 12. Home medications include ferrous sulfate 325 daily.  On Eliquis for history of atrial flutter.  No complaints of bleeding at this time.  We will give IV iron.  Acute on chronic diastolic CHF exacerbation: Most recent echo 60-65% in December of last year with mild left ventricular hypertrophy and indeterminate left ventricular diastolic parameters.  Currently on 2L nasal cannula.  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  Calcium elevated at 11.5.  PTH checked in December 2021 shows intact PTH elevated at 214, consistent with primary hyperparathyroidism.  -Continue Renvela. -Initiated calcitonin  yesterday  Urinary retention:  Likely secondary to BPH. Agree with trial of flomax per primary.  Foley catheter has been removed.  With patient putting good urine output at this time. -Every 12 hours bladder scans  -Diuresis as above  Diet-controlled diabetes: Treatment per primary  Subjective:   Improve Patient states that he feels significantly better today.  He denies nausea.  States he still has some dizziness but it is improved                                            Objective:   BP (!) 125/93 (BP Location: Right Arm)   Pulse (!) 105   Temp 97.8 F (36.6 C) (Oral)   Resp 20   Ht '5\' 9"'$  (1.753 m)   Wt 112.7 kg   SpO2 96%   BMI 36.69 kg/m   Intake/Output Summary (Last 24 hours) at 12/20/2020 [id number] Last data filed at 12/20/2020 [id number] Gross per 24 hour  Intake 120 ml  Output 3950 ml  Net -3830 ml   Weight change: -2.9 kg  Physical Exam: General: Alert and oriented in no apparent distress Heart: Tachycardic rate, no murmur appreciated, aVF in left wrist with good bruit and palpable thrill Lungs: CTA bilaterally Extremities: 1+ pitting edema in the bilateral lower extremities  Imaging: No results found.  Labs: BMET Recent Labs  Lab 12/14/20 1431 12/15/20 0305 12/15/20 2219 12/16/20 0103 12/17/20 0037 12/18/20 0026 12/19/20 0006 12/20/20 0147  NA 132* 133*  --  135 132* 135 136 137  K 4.6 4.5  --  3.9 4.1 4.2 4.1 3.7  CL 91* 92*  --  93* 91* 94* 93* 92*  CO2 24 27  --  '30 29 28 29 30  '$ GLUCOSE 102* 86  --  118* 91 107* 82 94  BUN 99* 104*  --  109* 112* 111* 107* 100*  CREATININE 5.49* 5.52*  --  5.85* 5.70* 5.38* 4.94* 4.70*  CALCIUM 10.6* 10.8*  --  10.5* 10.2 10.9* 11.1* 11.5*  PHOS  --   --  7.5* 7.7* 7.2* 6.7* 6.5* 5.0*   CBC Recent Labs  Lab 12/15/20 0305 12/17/20 0037 12/18/20 0457 12/19/20 0006  WBC 9.1 9.2 10.4 9.9  HGB 9.7* 8.1* 8.8* 9.2*  HCT 30.7* 26.5* 29.0* 29.2*  MCV 105.1* 107.3* 108.2* 105.8*  PLT 205 159 146* 160     Medications:    . apixaban  5 mg Oral BID  . calcitonin ([place])  1 spray Alternating Nares Daily  . furosemide  80 mg Intravenous Q12H  . metoprolol tartrate  12.5 mg Oral BID  . midodrine  15 mg Oral TID WC  . mupirocin ointment  1 application Nasal BID  . sevelamer carbonate  800 mg Oral TID WC  . tamsulosin  0.4 mg Oral Daily     Lurline Del, DO [facility] Resident, PGY2 12/20/2020, 6:49 AM

## 2020-12-21 DIAGNOSIS — I5033 Acute on chronic diastolic (congestive) heart failure: Secondary | ICD-10-CM | POA: Diagnosis not present

## 2020-12-21 LAB — RENAL FUNCTION PANEL
Albumin: 3.1 g/dL — ABNORMAL LOW (ref 3.5–5.0)
Anion gap: 15 (ref 5–15)
BUN: 94 mg/dL — ABNORMAL HIGH (ref 8–23)
CO2: 30 mmol/L (ref 22–32)
Calcium: 11.2 mg/dL — ABNORMAL HIGH (ref 8.9–10.3)
Chloride: 90 mmol/L — ABNORMAL LOW (ref 98–111)
Creatinine, Ser: 4.43 mg/dL — ABNORMAL HIGH (ref 0.61–1.24)
GFR, Estimated: 14 mL/min — ABNORMAL LOW (ref >60)
Glucose, Bld: 95 mg/dL (ref 70–99)
Phosphorus: 4.5 mg/dL (ref 2.5–4.6)
Potassium: 3.6 mmol/L (ref 3.5–5.1)
Sodium: 135 mmol/L (ref 135–145)

## 2020-12-21 MED ORDER — FUROSEMIDE 80 MG PO TABS
80.0000 mg | ORAL_TABLET | Freq: Two times a day (BID) | ORAL | Status: DC
  Administered 2020-12-21 – 2020-12-23 (×5): 80 mg via ORAL
  Filled 2020-12-21 (×5): qty 1

## 2020-12-21 NOTE — Plan of Care (Signed)

## 2020-12-21 NOTE — Progress Notes (Signed)
Progress Note    Mark [name]  [id number] DOB: [birth year]  DOA: 12/14/2020 PCP: Delrae Rend, MD    Brief Narrative:     Medical records reviewed and are as summarized below:  Mark [name] is an 60 y.o. male with a PMH ofchronic atrial flutter on Eliquis, CKD stage V, chronic diastolic CHF (EF 123456 by TTE 10/09/2020), anemia of chronic kidney disease, diet-controlled type 2 diabetes, recurrent left hip dislocationwho presents to the ED via EMS for evaluation of a 5 day h/o worsening dyspnea.  chest x-ray showed cardiomegaly with elevation of left hemidiaphragm and streaky airspace opacities in the lingula. The patient was given IV Lasix 80 mg once and the hospitalist service was consulted to admit for further evaluation and management. Cardiology and nephrology have been consulted to assist with management. Cardiology feels nothing to add given normal EF and advanced renal disease and prefers to let nephrology manage volume status issues.   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  -nephrology diuresing- changed to PO lasix on 2/13 -no need for urgent HD -started on midodrine for BP Support so diuretics can be given  Acute respiratory failure -wean to room air -81% documented on RA  prior -mobilize -off O2  Urinary retention, Acute, POA -foley placed 2/8 for acute urinary retention and removed 2/11 -flomax  MRSA Carrier, POA Decontamination prescribed.  AKI on CKD stage IV, POA Creatinine elevated compared to recent baseline, suspected to be related to hypervolemia. Continue Lasix diuresis as above and monitor closely. Patient has a left wrist aVF placed in 2017 but has not required initiation of dialysis. -nephrology consult appreciated  Chronic atrialflutter/acquired thrombophilia, POA Remains in atrial flutter with controlled rate.  -continue eliquis at appropriate dose-- monitor for bleeding - per last note from cardiology: will touch base with Dr. Margaretann Loveless in the AM:   - would resume eliquis for now given attempt to cardiovert upcoming. With renal disease and history of bleeding ulcer requiring transfusion, may be difficult to anticoagulate ongoing. Will follow closely. Consider referral to Afib clinic. Will see him back in 1 mo to ensure still in afib  and compliance with AC.  - continue metoprolol succinate. Rates well controlled.  - This patients CHA2DS2-VASc Score and unadjusted Ischemic Stroke Rate (% per year) is equal to 0.6 % stroke rate/year from a score of 1  Above score calculated as 1 point each if present [CHF, HTN, DM, Vascular=MI/PAD/Aortic Plaque, Age if 65-74, or Male]  Above score calculated as 2 points each if present [Age > 75, or Stroke/TIA/TE] -resumed low dose metoprolol- increase as tolerated  Anemia of chronic kidney disease, chronic, POA Chronic and stable. Patient reported seeing some minor bleeding several days ago therefore he held his Eliquis. increased eliquis to pharmacy recommend dose -mild bleeding but hgb stable  Diet controlled DM with  renal complications, POA Chronic, controlled.  Hypercalcemia/hyperphosphatemia, chronic, related to CKD, POA Likely from secondary hyperparathyroidism related to CKD.  Phosphorus elevated.   sevelamer     Benign essential HTN, chronic, POA -monitor closely on low dose metoprolol   Obesity Estimated body mass index is 36.04 kg/m as calculated from the following:   Height as of this encounter: '5\' 9"'$  (1.753 m).   Weight as of this encounter: 110.7 kg.  -prob need outpatient sleep apnea study   Family Communication/Anticipated D/C date and plan/Code Status   DVT prophylaxis: eliquis Code Status: Full Code.  Disposition Plan: Status is: Inpatient  Remains inpatient appropriate because:Inpatient level of care appropriate due to severity of illness   Dispo: The patient is from: Home              Anticipated d/c is to: Home              Anticipated d/c date is: 1-2 days              Patient currently is not medically stable to d/c. pending labs in AM, will also discuss with patient's cardiologist   Difficult to place patient No   Medical Consultants:    renal.    Subjective:   No current complaints  Objective:    Vitals:   12/21/20 0344 12/21/20 0350 12/21/20 0739 12/21/20 1112  BP: 109/84  105/77 100/75  Pulse: (!) 112     Resp: 19  (!) 21 12  Temp: 97.9 F (36.6 C)   97.6 F (36.4 C)  TempSrc: Oral   Oral  SpO2: 96%     Weight:  110.7 kg    Height:        Intake/Output Summary (Last 24 hours) at 12/21/2020 1114 Last data filed at 12/21/2020 1113 Gross per 24 hour  Intake 600 ml  Output 3175 ml  Net -2575 ml   Filed Weights   12/19/20 0523 12/20/20 0602 12/21/20 0350  Weight: 115.6 kg 112.7 kg 110.7 kg    Exam:  General: Appearance:    Obese male in no acute distress in chair     Lungs:     respirations unlabored, diminished due to size  Heart:    Tachycardic. Normal rhythm. No murmurs, rubs, or gallops.   MS:   All extremities are intact.   Neurologic:   Awake, alert, oriented x 3. No apparent focal neurological           defect.        Data Reviewed:   I have personally reviewed following labs and  imaging studies:  Labs: Labs show the following:   Basic Metabolic Panel: Recent Labs  Lab 12/17/20 0037 12/18/20 0026 12/19/20 0006 12/20/20 0147 12/21/20 0119  NA 132* 135 136 137 135  K 4.1 4.2 4.1 3.7 3.6  CL 91* 94* 93* 92* 90*  CO2 '29 28 29 30 30  '$ GLUCOSE 91 107* 82 94 95  BUN 112* 111* 107* 100* 94*  CREATININE 5.70* 5.38* 4.94* 4.70* 4.43*  CALCIUM 10.2 10.9* 11.1* 11.5* 11.2*  PHOS 7.2* 6.7* 6.5* 5.0* 4.5   GFR Estimated Creatinine Clearance: 21.5 mL/min (A) (by C-G formula based on SCr of 4.43 mg/dL (H)). Liver Function Tests: Recent Labs  Lab 12/17/20 0037 12/18/20 0026 12/19/20 0006 12/20/20 0147 12/21/20 0119  ALBUMIN 2.9* 3.0* 3.2* 3.2* 3.1*   No results for input(s): LIPASE, AMYLASE in the last 168 hours. No results for input(s): AMMONIA in the last 168  hours. Coagulation profile No results for input(s): INR, PROTIME in the last 168 hours.  CBC: Recent Labs  Lab 12/14/20 1431 12/15/20 0305 12/17/20 0037 12/18/20 0457 12/19/20 0006  WBC 9.2 9.1 9.2 10.4 9.9  HGB 9.2* 9.7* 8.1* 8.8* 9.2*  HCT 29.6* 30.7* 26.5* 29.0* 29.2*  MCV 103.1* 105.1* 107.3* 108.2* 105.8*  PLT 161 205 159 146* 160   Cardiac Enzymes: No results for input(s): CKTOTAL, CKMB, CKMBINDEX, TROPONINI in the last 168 hours. BNP (last 3 results) No results for input(s): PROBNP in the last 8760 hours. CBG: Recent Labs  Lab 12/16/20 0332  GLUCAP 91   D-Dimer: No results for input(s): DDIMER in the last 72 hours. Hgb A1c: No results for input(s): HGBA1C in the last 72 hours. Lipid Profile: No results for input(s): CHOL, HDL, LDLCALC, TRIG, CHOLHDL, LDLDIRECT in the last 72 hours. Thyroid function studies: No results for input(s): TSH, T4TOTAL, T3FREE, THYROIDAB in the last 72 hours.  Invalid input(s): FREET3 Anemia work up: No results for input(s): VITAMINB12, FOLATE, FERRITIN, TIBC, IRON, RETICCTPCT in the last 72 hours. Sepsis Labs: Recent Labs  Lab 12/15/20 0305  12/17/20 0037 12/18/20 0457 12/19/20 0006  WBC 9.1 9.2 10.4 9.9    Microbiology Recent Results (from the past 240 hour(s))  SARS CORONAVIRUS 2 (TAT 6-24 HRS) Nasopharyngeal Nasopharyngeal Swab     Status: None   Collection Time: 12/14/20  5:47 PM   Specimen: Nasopharyngeal Swab  Result Value Ref Range Status   SARS Coronavirus 2 NEGATIVE NEGATIVE Final    Comment: (NOTE) SARS-CoV-2 target nucleic acids are NOT DETECTED.  The SARS-CoV-2 RNA is generally detectable in upper and lower respiratory specimens during the acute phase of infection. Negative results do not preclude SARS-CoV-2 infection, do not rule out co-infections with other pathogens, and should not be used as the sole basis for treatment or other patient management decisions. Negative results must be combined with clinical observations, patient history, and epidemiological information. The expected result is Negative.  Fact Sheet for Patients: SugarRoll.be  Fact Sheet for Healthcare Providers: https://www.woods-mathews.com/  This test is not yet approved or cleared by the [place] FDA and  has been authorized for detection and/or diagnosis of SARS-CoV-2 by FDA under an Emergency Use Authorization (EUA). This EUA will remain  in effect (meaning this test can be used) for the duration of the COVID-19 declaration under Se ction 564(b)(1) of the Act, 21 U.S.C. section 360bbb-3(b)(1), unless the authorization is terminated or revoked sooner.  Performed at [facility], [place] [address], [place], [place] [address]   MRSA PCR Screening     Status: Abnormal   Collection Time: 12/15/20 11:30 PM   Specimen: Nasopharyngeal  Result Value Ref Range Status   MRSA by PCR POSITIVE (A) NEGATIVE Final    Comment:        The GeneXpert MRSA Assay (FDA approved for NASAL specimens only), is one component of a comprehensive MRSA colonization surveillance program. It is  not intended to diagnose MRSA infection nor to guide or monitor treatment for MRSA infections. RESULT CALLED TO, READ BACK BY AND VERIFIED WITH: HAGERMAN,K RN [id number] 12/16/2020 MITCHELL,L Performed at [facility], [place] [address], [place], [place] [address]     Procedures and diagnostic studies:  No results found.  Medications:   . apixaban  5 mg Oral BID  . furosemide  80 mg Oral BID  . metoprolol tartrate  12.5 mg Oral BID  . midodrine  15 mg Oral  TID WC  . sevelamer carbonate  800 mg Oral TID WC  . tamsulosin  0.4 mg Oral Daily   Continuous Infusions:    LOS: 6 days   Geradine Girt  [facility]   How to contact the [facility] Attending or Consulting provider [place] or covering provider during after hours [place], for this patient?  1. Check the care team in [facility] and look for a) attending/consulting TR[facility] provider listed and b) the [facility] team listed 2. Log into [email or web address] and use [place]'s universal password to access. If you do not have the password, please contact the hospital operator. 3. Locate the [facility] provider you are looking for under [facility] and page to a number that you can be directly reached. 4. If you still have difficulty reaching the provider, please page the [facility] (Director on Call) for the Hospitalists listed on amion for assistance.  12/21/2020, 11:14 AM

## 2020-12-21 NOTE — Progress Notes (Signed)
[facility] Progress Note    Assessment/ Plan:    AKI on CKD stage IV: Baseline creatinine appears to be 4.25-4.75 on the most recent hospitalizations, however as outpatient creatinine seems to be around 3.9.  Initial creatinine on presentation of 5.49>5.85>5.70>5.38>4.94>4.70>4.43. BUN improving from 111> 100>94.  Phos at 4.5 but improved from yesterday, albumin 3.2.  I's and O's show 3550 mL urine output over last 24 hours.   -Continue IV Lasix 80 mg twice daily -Strict I's and O's -Midodrine 15 mg 3 times daily -No need for dialysis at this time.  We will continue to monitor. Patient is vocal about the fact that he doesn't want to have HD unless absolutely necessary.  Uremia: BUN elevated at 109>112>107> 100>94.  No complaints of nausea.  Consider hemodialysis if worsens and if blood pressure tolerates.  Volume Status: Still appears volume overloaded 1+ pitting edema in the lower extremities but lungs clear to auscultation today.  Patient has had significant urine output over the past 24 hours  Hypotension: Blood pressure range 106/79-121/90 since last night.  Most recent blood pressure 109/84.  Heart rate in the low 100s.  Currently on midodrine 15 mg 3 times per day  Anemia due to CKD: Most recent hemoglobin 9.2.  Baseline appears to be 8-8.5.   Iron studies show iron decreased to 25, TIBC reduced at 209, sat ratio reduced at 12. Home medications include ferrous sulfate 325 daily.  On Eliquis for history of atrial flutter.  No complaints of bleeding at this time.  We will give IV iron.  Acute on chronic diastolic CHF exacerbation: Most recent echo 60-65% in December of last year with mild left ventricular hypertrophy and indeterminate left ventricular diastolic parameters.  Currently on 2L nasal cannula.  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  Calcium elevated at 11.5.  PTH checked in December 2021 shows intact PTH elevated at 214, consistent with primary hyperparathyroidism.   -Continue Renvela. -Initiated calcitonin   Urinary retention:  Likely secondary to BPH. Agree with trial of flomax per primary.  Foley catheter has been removed.  With patient putting good urine output at this time. -Every 12 hours bladder scans  -Diuresis as above  Diet-controlled diabetes: Treatment per primary  Subjective:   Patient reports he is doing well.  No complaints at this time.  Feels better and better each day.                      Objective:   BP 109/84 (BP Location: Right Arm)   Pulse (!) 112   Temp 97.9 F (36.6 C) (Oral)   Resp 19   Ht '5\' 9"'$  (1.753 m)   Wt 110.7 kg   SpO2 96%   BMI 36.04 kg/m   Intake/Output Summary (Last 24 hours) at 12/21/2020 [id number] Last data filed at 12/21/2020 [id number] Gross per 24 hour  Intake 840 ml  Output 3550 ml  Net -2710 ml   Weight change: -2 kg  Physical Exam: General: Sitting in chair next to hospital bed, comfortable, alert Heart: Tachycardic rate in the 100s, no murmur appreciated, aVF in left wrist with good bruit and palpable thrill Lungs: CTA bilaterally Extremities: 1+ pitting edema in the bilateral lower extremities,   Imaging: No results found.  Labs: BMET Recent Labs  Lab 12/15/20 0305 12/15/20 2219 12/16/20 0103 12/17/20 0037 12/18/20 0026 12/19/20 0006 12/20/20 0147 12/21/20 0119  NA 133*  --  135 132* 135 136 137 135  K 4.5  --  3.9 4.1 4.2 4.1 3.7 3.6  CL 92*  --  93* 91* 94* 93* 92* 90*  CO2 27  --  '30 29 28 29 30 30  '$ GLUCOSE 86  --  118* 91 107* 82 94 95  BUN 104*  --  109* 112* 111* 107* 100* 94*  CREATININE 5.52*  --  5.85* 5.70* 5.38* 4.94* 4.70* 4.43*  CALCIUM 10.8*  --  10.5* 10.2 10.9* 11.1* 11.5* 11.2*  PHOS  --  7.5* 7.7* 7.2* 6.7* 6.5* 5.0* 4.5   CBC Recent Labs  Lab 12/15/20 0305 12/17/20 0037 12/18/20 0457 12/19/20 0006  WBC 9.1 9.2 10.4 9.9  HGB 9.7* 8.1* 8.8* 9.2*  HCT 30.7* 26.5* 29.0* 29.2*  MCV 105.1* 107.3* 108.2* 105.8*  PLT 205 159 146* 160    Medications:     . apixaban  5 mg Oral BID  . furosemide  80 mg Intravenous Q12H  . metoprolol tartrate  12.5 mg Oral BID  . midodrine  15 mg Oral TID WC  . sevelamer carbonate  800 mg Oral TID WC  . tamsulosin  0.4 mg Oral Daily     Gifford Shave, MD [place] Resident, PGY2 12/21/2020, 6:52 AM

## 2020-12-22 ENCOUNTER — Encounter (HOSPITAL_COMMUNITY): Payer: Self-pay | Admitting: Internal Medicine

## 2020-12-22 DIAGNOSIS — I5033 Acute on chronic diastolic (congestive) heart failure: Secondary | ICD-10-CM | POA: Diagnosis not present

## 2020-12-22 DIAGNOSIS — I4892 Unspecified atrial flutter: Secondary | ICD-10-CM | POA: Diagnosis not present

## 2020-12-22 LAB — RENAL FUNCTION PANEL
Albumin: 2.9 g/dL — ABNORMAL LOW (ref 3.5–5.0)
Anion gap: 14 (ref 5–15)
BUN: 97 mg/dL — ABNORMAL HIGH (ref 8–23)
CO2: 27 mmol/L (ref 22–32)
Calcium: 11 mg/dL — ABNORMAL HIGH (ref 8.9–10.3)
Chloride: 94 mmol/L — ABNORMAL LOW (ref 98–111)
Creatinine, Ser: 4.91 mg/dL — ABNORMAL HIGH (ref 0.61–1.24)
GFR, Estimated: 13 mL/min — ABNORMAL LOW (ref >60)
Glucose, Bld: 86 mg/dL (ref 70–99)
Phosphorus: 4.3 mg/dL (ref 2.5–4.6)
Potassium: 5.1 mmol/L (ref 3.5–5.1)
Sodium: 135 mmol/L (ref 135–145)

## 2020-12-22 NOTE — Progress Notes (Signed)
Physical Therapy Treatment Patient Details Name: Mark [name] MRN: [id number] DOB: February 17, 1961 Today's Date: 12/22/2020    History of Present Illness Mark [name] is an 60 y.o. male with a PMH of chronic atrial flutter on Eliquis, CKD stage V, chronic diastolic CHF (EF 123456 by TTE 10/09/2020), anemia of chronic kidney disease, diet-controlled type 2 diabetes, recurrent left hip dislocation who presents to the ED via EMS for evaluation of a 5 day h/o worsening dyspnea    PT Comments    Pt agreeable to bed exercises due to choking on pills prior to session. Pt agreeable and receptive to HEP. Pt verbalized understanding. Pt will benefit from skilled PT to address deficits to maximize independence with functional moiblity prior to discharge.    Follow Up Recommendations  Home health PT;Supervision for mobility/OOB     Equipment Recommendations       Recommendations for Other Services       Precautions / Restrictions      Mobility  Bed Mobility                    Transfers                    Ambulation/Gait                 Stairs             Wheelchair Mobility    Modified Rankin (Stroke Patients Only)       Balance                                            Cognition                                              Exercises General Exercises - Lower Extremity Ankle Circles/Pumps: AROM;Both;10 reps;Supine Heel Slides: AROM;Both;10 reps;Supine Hip ABduction/ADduction: AROM;Both;10 reps;Supine Straight Leg Raises: AROM;Both;10 reps;Supine    General Comments General comments (skin integrity, edema, etc.): pt had choked on pills and now resting in bed, agreeable to bed exercises; education on HEP supine, sitting and satnding, performed only supine      Pertinent Vitals/Pain      Home Living                      Prior Function            PT Goals (current goals can now be  found in the care plan section) Acute Rehab PT Goals Patient Stated Goal: I want to get better to go home PT Goal Formulation: With patient Time For Goal Achievement: 01/01/21 Potential to Achieve Goals: Good Progress towards PT goals: Progressing toward goals    Frequency    Min 3X/week      PT Plan Current plan remains appropriate    [name]-evaluation              AM-PAC PT "6 Clicks" Mobility   Outcome Measure  Help needed turning from your back to your side while in a flat bed without using bedrails?: None Help needed moving from lying on your back to sitting on the side of a flat bed without using bedrails?: A Little Help needed moving to and from a  bed to a chair (including a wheelchair)?: A Little Help needed standing up from a chair using your arms (e.g., wheelchair or bedside chair)?: A Little Help needed to walk in hospital room?: A Little Help needed climbing 3-5 steps with a railing? : A Lot 6 Click Score: 18    End of Session   Activity Tolerance: Patient tolerated treatment well;Patient limited by lethargy Patient left: in bed;with call bell/phone within reach Nurse Communication: Mobility status PT Visit Diagnosis: Unsteadiness on feet (R26.81);Other abnormalities of gait and mobility (R26.89);Muscle weakness (generalized) (M62.81)     Time: 1220-1229 PT Time Calculation (min) (ACUTE ONLY): 9 min  Charges:  $Therapeutic Exercise: 8-22 mins                     Mark [name], DPT Acute Rehabilitation Services [id number]   Mark [name] 12/22/2020, 12:55 PM

## 2020-12-22 NOTE — Discharge Instructions (Signed)
°  Your follow up office visit with Dr. Posey Pronto at [facility] is scheduled for Thurs 24th 3:15pm. Please arrive at least 15 minutes prior to the appointment time.

## 2020-12-22 NOTE — Progress Notes (Signed)
[facility] Progress Note    Assessment/ Plan:    AKI on CKD stage IV: Baseline creatinine appears to be 4.25-4.75 on the most recent hospitalizations, however as outpatient creatinine seems to be around 3.9.  Initial creatinine on presentation of 5.49>4.94>4.70>4.43>4.91. BUN from 111> 100>94>97.  Phos improved to 4.3, albumin 2.9.  I's and O's show 1.6 L urine output over last 24 hours.  Patient down on the 17 L since admission. Nephrology will sign off and make an appointment for patient with Dr. Posey Pronto at [facility] for next week for follow up and labs. The appointment time has been listed in the patient's discharge instructions for when he is appropriate for discharge.  -Currently on oral Lasix 80 mg twice daily, continue at time of discharge. -Midodrine 15 mg 3 times daily, continue at time of discharge -No need for dialysis at this time.    Uremia: BUN elevated at 109>112>107> 100>94>97.  No complaints of nausea this morning.   Volume Status: Still appears volume overloaded but dramatically improved from admission.patient has had 1.6 L of urine output after switching to oral diuretics over the past 24 hours.  Patient with almost 17 L urine output since admission  Hypotension: Blood pressure range 100-128/61-90 since last night.  Most recent blood pressure 100/61.  Heart rate in the low 100s.  Currently on midodrine 15 mg 3 times per day  Anemia due to CKD: Most recent hemoglobin 9.2.  Baseline appears to be 8-8.5.   Iron studies show iron decreased to 25, TIBC reduced at 209, sat ratio reduced at 12. Home medications include ferrous sulfate 325 daily.  On Eliquis for history of atrial flutter.  No complaints of bleeding at this time.  We will give IV iron.  Acute on chronic diastolic CHF exacerbation: Most recent echo 60-65% in December of last year with mild left ventricular hypertrophy and indeterminate left ventricular diastolic parameters.  Currently on 2L nasal  cannula.  CKD MBD: Phosphorus of 4.3.  Calcium elevated at 11.0.  PTH checked in December 2021 shows intact PTH elevated at 214, consistent with primary hyperparathyroidism.  -Continue Renvela. -Initiated calcitonin   Urinary retention:  Likely secondary to BPH. Agree with trial of flomax per primary.  Initially had Foley catheter which has been removed.  With 1.6 L urine output over past 24 hours. -Diuresis as above  Diet-controlled diabetes: Treatment per primary  Subjective:   Patient denies complaints this morning.  Denies nausea.                Objective:   BP 111/79 (BP Location: Right Arm)   Pulse 80   Temp 97.9 F (36.6 C) (Oral)   Resp 20   Ht '5\' 9"'$  (1.753 m)   Wt 110.9 kg   SpO2 100%   BMI 36.10 kg/m   Intake/Output Summary (Last 24 hours) at 12/22/2020 0738 Last data filed at 12/22/2020 0024 Gross per 24 hour  Intake 240 ml  Output 1625 ml  Net -1385 ml   Weight change: 0.2 kg  Physical Exam: General: Alert and oriented in no apparent distress Heart: Tachycardic rate, regular rhythm, no murmurs appreciated.  AV fistula present in left wrist with good bruit and palpable thrill. Lungs: CTA bilaterally, no wheezing Extremities: 1+ pitting edema in bilateral lower extremities up to the level of the knee.   Imaging: No results found.  Labs: [employer] 12/16/20 0103 12/17/20 [id number] 12/18/20 [id number] 12/19/20 0006 12/20/20 0147 12/21/20 [id number]  12/22/20 0031  NA 135 132* 135 136 137 135 135  K 3.9 4.1 4.2 4.1 3.7 3.6 5.1  CL 93* 91* 94* 93* 92* 90* 94*  CO2 '30 29 28 29 30 30 27  '$ GLUCOSE 118* 91 107* 82 94 95 86  BUN 109* 112* 111* 107* 100* 94* 97*  CREATININE 5.85* 5.70* 5.38* 4.94* 4.70* 4.43* 4.91*  CALCIUM 10.5* 10.2 10.9* 11.1* 11.5* 11.2* 11.0*  PHOS 7.7* 7.2* 6.7* 6.5* 5.0* 4.5 4.3   CBC Recent Labs  Lab 12/17/20 0037 12/18/20 0457 12/19/20 0006  WBC 9.2 10.4 9.9  HGB 8.1* 8.8* 9.2*  HCT 26.5* 29.0* 29.2*  MCV 107.3* 108.2*  105.8*  PLT 159 146* 160    Medications:    . apixaban  5 mg Oral BID  . furosemide  80 mg Oral BID  . metoprolol tartrate  12.5 mg Oral BID  . midodrine  15 mg Oral TID WC  . sevelamer carbonate  800 mg Oral TID WC  . tamsulosin  0.4 mg Oral Daily     Lurline Del, DO [facility] Family Medicine Resident, PGY2 12/22/2020, 7:38 AM

## 2020-12-22 NOTE — Consult Note (Addendum)
Cardiology Consultation:   Patient ID: Mark [name] MRN: [id number]; DOB: 03-Aug-1961  Admit date: 12/14/2020 Date of Consult: 12/22/2020  PCP:  Delrae Rend, MD   [place]  Cardiologist:  [name], MD  Advanced Practice Provider:  No care team member to display Electrophysiologist:  None        Patient Profile:   Mark [name] is a 60 y.o. male with a hx of moderate AS, CKD V, HTN, gout, Persistent a flutter, DM-2,  and hx of hip dislocations and admitted 12/14/20 with acute on chronic diastolic CHF, acute respiratory failure who is being seen today for the evaluation of atrial flutter at the request of Dr. Eliseo Squires.  History of Present Illness:   Mark [name] with above hx and plans for DCCV after 30 days of uninterrupted AC.  This has been difficult to accomplish due to skin procedure.    On admit with CHF and diuresed.  He has CKD stage V and renal is following for CKD and CHF.  His eliquis was reduced to 2.5 mg BID due to Cr. Now back to 5 mg BID.  Has not missed a dose since admit--but he did miss several doses as outpt.  Some were due to bleeding.     He is now on po lasix 80 BID.  BP has been low so now on midodrine.  His 02 is slowly being weaned.    I&O neg 17,216 since admit wt from 120 Kg to 110.9 kg ( down about 21 lbs)    EKG:  The EKG was personally reviewed and demonstrates:  A flutter rate controlled no acute ST changes Telemetry:  Telemetry was personally reviewed and demonstrates:  A flutter mostly rate controlled on occ to 115.    Na 135 k+ 5.1 today Cr 4.91 Ca+ 11 GFR 13 Hgb 9.2, WBC 9.9 plts 160  Iron 25, uibc 184 TIBC 209 ferritin 339 (BNP on the 6th 1930, hs troponin 34; 37) 2V cXR on admit  IMPRESSION: Streaky airspace opacities in the lingula, may represent atelectasis or peribronchial airspace consolidation. Enlarged cardiac silhouette.   today BP  118/88 to 90/69 P 87 T 97.6  No SOB and no chest pain, never had  chest pain  Past Medical History:  Diagnosis Date  . Anemia    labs today indicate (09/27/11)  . Arthritis    right knee  . Bleeding ulcer    hx of, required 6 units blood  . Blood transfusion    6 units with ulcer repair surgery  . Chronic kidney disease    stage IV, has fistula, never started diaylsis  . Complication of anesthesia    little slow to wake up with last surgery  . Dyspnea    with exertion  . Gout   . Hip dislocation, left ([place]) 10/09/2020  . Hypertension   . Osteoporosis   . Pneumonia     Past Surgical History:  Procedure Laterality Date  . AV FISTULA PLACEMENT Left 09/07/2016   Procedure: ARTERIOVENOUS (AV) FISTULA CREATION LEFT ARM;  Surgeon: Angelia Mould, MD;  Location: [place] [facility];  Service: Vascular;  Laterality: Left;  . COLONOSCOPY    . EXTERNAL EAR SURGERY    . HIP CLOSED REDUCTION Left 07/27/2020   Procedure: CLOSED REDUCTION HIP;  Surgeon: Marchia Bond, MD;  Location: [facility];  Service: Orthopedics;  Laterality: Left;  . HIP PINNING  11/2010  . JOINT REPLACEMENT    . OTHER SURGICAL  HISTORY  ~ 08/1998   repair bleeding ulcer  . SHOULDER ARTHROSCOPY WITH ROTATOR CUFF REPAIR AND SUBACROMIAL DECOMPRESSION Right 05/26/2017   Procedure: SHOULDER ARTHROSCOPY WITH ROTATOR CUFF REPAIR AND SUBACROMIAL DECOMPRESSION;  Surgeon: Tania Ade, MD;  Location: [place];  Service: Orthopedics;  Laterality: Right;  SHOULDER ARTHROSCOPY WITH ROTATOR CUFF REPAIR AND SUBACROMIAL DECOMPRESSION  . TOTAL HIP ARTHROPLASTY  10/06/11   left  . TOTAL HIP ARTHROPLASTY  10/06/2011   Procedure: TOTAL HIP ARTHROPLASTY;  Surgeon: Kerin Salen;  Location: [place];  Service: Orthopedics;  Laterality: Left;     Home Medications:  Prior to Admission medications   Medication Sig Start Date End Date Taking? Authorizing Provider  apixaban (ELIQUIS) 5 MG TABS tablet Take 1 tablet (5 mg total) by mouth 2 (two) times daily. 12/05/20  Yes [name], MD  ferrous sulfate 325 (65  FE) MG tablet Take 1 tablet (325 mg total) by mouth daily with breakfast. 08/18/20  Yes Medina-Vargas, Monina C, NP  furosemide (LASIX) 40 MG tablet Take 1 tablet (40 mg total) by mouth 2 (two) times daily. 10/29/20  Yes Medina-Vargas, Monina C, NP  loratadine (CLARITIN) 10 MG tablet Take 10 mg by mouth daily.   Yes [provider]  metoprolol succinate (TOPROL-XL) 100 MG 24 hr tablet Take 1 tablet (100 mg total) by mouth daily. Take with or immediately following a meal. 10/29/20  Yes Medina-Vargas, Monina C, NP  Multiple Vitamin (MULTIVITAMIN WITH MINERALS) TABS tablet Take 1 tablet by mouth daily after breakfast.   Yes [provider]  sevelamer carbonate (RENVELA) 800 MG tablet Take 1 tablet (800 mg total) by mouth 3 (three) times daily with meals. 10/29/20  Yes Medina-Vargas, Monina C, NP  sodium bicarbonate 650 MG tablet Take 1 tablet (650 mg total) by mouth 3 (three) times daily. 10/29/20  Yes Medina-Vargas, Monina C, NP  vitamin B-12 (CYANOCOBALAMIN) 1000 MCG tablet Take 1,000 mcg by mouth daily.   Yes [provider]  allopurinol (ZYLOPRIM) 100 MG tablet Take 0.5 tablets (50 mg total) by mouth every Monday, Wednesday, and Friday. 10/29/20   Medina-Vargas, Monina C, NP  polyethylene glycol (MIRALAX / GLYCOLAX) 17 g packet Take 17 g by mouth 2 (two) times daily as needed for mild constipation. 10/15/20   Mercy Riding, MD    Inpatient Medications: Scheduled Meds: . apixaban  5 mg Oral BID  . furosemide  80 mg Oral BID  . metoprolol tartrate  12.5 mg Oral BID  . midodrine  15 mg Oral TID WC  . sevelamer carbonate  800 mg Oral TID WC  . tamsulosin  0.4 mg Oral Daily   Continuous Infusions:  PRN Meds: acetaminophen **OR** acetaminophen, ondansetron **OR** ondansetron (ZOFRAN) IV  Allergies:    Allergies  Allergen Reactions  . Penicillins Swelling, Rash and Other (See Comments)    Arms and eyes swell & skin breaks out  Has patient had a PCN reaction causing  immediate rash, facial/tongue/throat swelling, SOB or lightheadedness with hypotension: Yes Has patient had a PCN reaction causing severe rash involving mucus membranes or skin necrosis: Yes Has patient had a PCN reaction that required hospitalization: No Has patient had a PCN reaction occurring within the last 10 years: No If all of the above answers are "NO", then may proceed with Cephalosporin use.   . Atorvastatin     Other reaction(s): Unknown  . Penicillin G     Unknown  . Rosuvastatin     Other reaction(s):  Unknown    Social History:   Social History   Socioeconomic History  . Marital status: Single    Spouse name: Not on file  . Number of children: Not on file  . Years of education: Not on file  . Highest education level: Not on file  Occupational History  . Not on file  Tobacco Use  . Smoking status: Current Some Day Smoker    Packs/day: 0.25    Years: 32.00    Pack years: 8.00    Types: Cigarettes  . Smokeless tobacco: Former Systems developer    Types: Chew    Quit date: 68  . Tobacco comment: smoking cessation consult entered  Vaping Use  . Vaping Use: Never used  Substance and Sexual Activity  . Alcohol use: Yes    Alcohol/week: 5.0 standard drinks    Types: 5 Cans of beer per week    Comment: occasional  . Drug use: No  . Sexual activity: Yes  Other Topics Concern  . Not on file  Social History Narrative  . Not on file   Social Determinants of Health   Financial Resource Strain: Not on file  Food Insecurity: Not on file  Transportation Needs: Not on file  Physical Activity: Not on file  Stress: Not on file  Social Connections: Not on file  Intimate Partner Violence: Not on file    Family History:    Family History  Problem Relation Age of Onset  . Cancer Mother   . Atrial fibrillation Mother   . Hypertension Mother   . Cancer Father      ROS:  Please see the history of present illness.  General:no colds or fevers, no weight changes Skin:no  rashes or ulcers HEENT:no blurred vision, no congestion CV:see HPI PUL:see HPI GI:no diarrhea constipation or melena, no indigestion GU:no hematuria, no dysuria MS:no joint pain, no claudication Neuro:no syncope, no lightheadedness Endo:no diabetes, no thyroid disease  All other ROS reviewed and negative.     Physical Exam/Data:   Vitals:   12/22/20 0422 12/22/20 0746 12/22/20 1056 12/22/20 1301  BP: 111/79 100/61 118/88 90/69  Pulse: 80  (!) 116 92  Resp: '20 16  13  '$ Temp: 97.9 F (36.6 C) 97.8 F (36.6 C)  97.6 F (36.4 C)  TempSrc: Oral Oral  Oral  SpO2: 100%   99%  Weight: 110.9 kg     Height:        Intake/Output Summary (Last 24 hours) at 12/22/2020 1405 Last data filed at 12/22/2020 1128 Gross per 24 hour  Intake 240 ml  Output 1800 ml  Net -1560 ml   Last 3 Weights 12/22/2020 12/21/2020 12/20/2020  Weight (lbs) 244 lb 7.8 oz 244 lb 0.8 oz 248 lb 7.3 oz  Weight (kg) 110.9 kg 110.7 kg 112.7 kg     Body mass index is 36.1 kg/m.  General:  Well nourished, well developed, in no acute distress up in lounge chair HEENT: normal Lymph: no adenopathy Neck: no JVD Endocrine:  No thryomegaly Vascular: No carotid bruits; pedal pulses 1+ bilaterally Cardiac:  normal S1, S2; RRR; no murmur gallup rub or click Lungs:  clear to auscultation bilaterally, diminished, no wheezing, rhonchi or rales  Abd: soft, nontender, no hepatomegaly  Ext: no edema Musculoskeletal:  No deformities, BUE and BLE strength normal and equal Skin: warm and dry  Neuro:  CNs 2-12 intact, no focal abnormalities noted Psych:  Normal affect     Relevant CV Studies: Echo 10/09/20 IMPRESSIONS  1. No subcostal views.  2. Left ventricular ejection fraction, by estimation, is 60 to 65%. The  left ventricle has normal function. The left ventricle has no regional  wall motion abnormalities. There is mild left ventricular hypertrophy.  Left ventricular diastolic parameters  are indeterminate.   3. Right ventricular systolic function is normal. The right ventricular  size is normal.  4. Left atrial size was severely dilated.  5. The mitral valve is normal in structure. Mild mitral valve  regurgitation. No evidence of mitral stenosis. Severe mitral annular  calcification.  6. The aortic valve is normal in structure. Aortic valve regurgitation is  trivial. Moderate aortic valve stenosis.   FINDINGS  Left Ventricle: Left ventricular ejection fraction, by estimation, is 60  to 65%. The left ventricle has normal function. The left ventricle has no  regional wall motion abnormalities. Definity contrast agent was given IV  to delineate the left ventricular  endocardial borders. The left ventricular internal cavity size was normal  in size. There is mild left ventricular hypertrophy. Left ventricular  diastolic parameters are indeterminate.   Right Ventricle: The right ventricular size is normal. Right ventricular  systolic function is normal.   Left Atrium: Left atrial size was severely dilated.   Right Atrium: Right atrial size was normal in size.   Pericardium: There is no evidence of pericardial effusion.   Mitral Valve: The mitral valve is normal in structure. Severe mitral  annular calcification. Mild mitral valve regurgitation. No evidence of  mitral valve stenosis.   Tricuspid Valve: The tricuspid valve is normal in structure. Tricuspid  valve regurgitation is trivial. No evidence of tricuspid stenosis.   Aortic Valve: The aortic valve is normal in structure. Aortic valve  regurgitation is trivial. Moderate aortic stenosis is present. Aortic  valve mean gradient measures 29.2 mmHg. Aortic valve peak gradient  measures 53.9 mmHg. Aortic valve area, by VTI  measures 1.14 cm.   Pulmonic Valve: The pulmonic valve was not well visualized. Pulmonic valve  regurgitation is not visualized. No evidence of pulmonic stenosis.   Aorta: The aortic root was not well  visualized.   Venous: The inferior vena cava was not well visualized.     Laboratory Data:  High Sensitivity Troponin:   Recent Labs  Lab 12/14/20 1431 12/14/20 1600  TROPONINIHS 37* 34*     Chemistry Recent Labs  Lab 12/20/20 0147 12/21/20 0119 12/22/20 0031  NA 137 135 135  K 3.7 3.6 5.1  CL 92* 90* 94*  CO2 '30 30 27  '$ GLUCOSE 94 95 86  BUN 100* 94* 97*  CREATININE 4.70* 4.43* 4.91*  CALCIUM 11.5* 11.2* 11.0*  GFRNONAA 13* 14* 13*  ANIONGAP '15 15 14    '$ Recent Labs  Lab 12/20/20 0147 12/21/20 0119 12/22/20 0031  ALBUMIN 3.2* 3.1* 2.9*   Hematology Recent Labs  Lab 12/17/20 0037 12/18/20 0457 12/19/20 0006  WBC 9.2 10.4 9.9  RBC 2.47* 2.68* 2.76*  HGB 8.1* 8.8* 9.2*  HCT 26.5* 29.0* 29.2*  MCV 107.3* 108.2* 105.8*  MCH 32.8 32.8 33.3  MCHC 30.6 30.3 31.5  RDW 16.4* 16.6* 16.8*  PLT 159 146* 160   BNPNo results for input(s): BNP, PROBNP in the last 168 hours.  DDimer No results for input(s): DDIMER in the last 168 hours.   Radiology/Studies:  No results found.   Assessment and Plan:   1. A flutter persistent with plans prior to admit for DCCV after 30 days on [facility]-  Here he continues on  eliquis with elevated Cr decreased to 2.5 BID but now back to 5 mg BID but he did miss several doses as outpt   Will defer to Dr. Margaretann Loveless with freq bleeding may be candidate for watchman  2. Acute on chronic diastolic CHF EF [id number]  now improved and on po lasix down 17 L  3. AKI on chronic improved no HD at this time.  Nephrology following 4. Hypotension, on midodrine 15 mg TID. 5. Anemia due to CKD  Now hgb 9.2  On IV iron per IM  6. Urinary retention - per IM  7. Diet controlled DM   Risk Assessment/Risk Scores:        New York Heart Association (NYHA) Functional Class NYHA Class III  CHA2DS2-VASc Score = 3  This indicates a 3.2% annual risk of stroke. The patient's score is based upon: CHF History: Yes HTN History: Yes Diabetes History: Yes Stroke  History: No Vascular Disease History: No Age Score: 0 Gender Score: 0         For questions or updates, please contact [place] Please consult [email or web address] for contact info under    Signed, Cecilie Kicks, NP  12/22/2020 2:05 PM  Patient seen and examined with Cecilie Kicks NP.  Agree as above, with the following exceptions and changes as noted below.  Mark [name] is seen today after diuresing in the hospital for several days and feeling closer to baseline.  Diuresis is being supported by midodrine given hypotension.  Challenge with rate control in the setting of held beta-blocker, improved with reinitiation of low-dose metoprolol.  Prior to hospital admission he had missed several doses of Eliquis due to bleeding.  Bleeding is described as pink-tinged urine, no major bleeding source identified.  Gen: NAD, CV: Irregular rhythm with normal rate, no murmurs, Lungs: clear, Abd: soft, Extrem: Warm, 1+ pitting bilateral edema with TED hose in place, Neuro/Psych: alert and oriented x 3, normal mood and affect. All available labs, radiology testing, previous records reviewed.   He is better rate controlled with AV nodal blockade reinstated.  Reasonable to continue midodrine for diuresis and avoidance of hypotension which will make it more challenging to diurese as an outpatient as well.  He is a challenging situation with intermittently poorly rate controlled atrial flutter.  I have tried to set him up for outpatient cardioversion but there have been interruptions in anticoagulation due to patient's superficial skin bleeding and hematuria and anemia.  It will be challenging to continue his anticoagulation as an outpatient and I think it may be time to consider watchman left atrial appendage occlusion.  This is coordinated through electrophysiology and I will make the referral upon discharge.  For now I would recommend continuing anticoagulation for stroke prevention, and continue beta-blocker for best  rate control.  I have answered all the patient's questions to the best my ability and he knows he can contact my office anytime with questions or concerns.  No further cardiovascular work-up is required in hospital, when stable for discharge from a nephrology and internal medicine standpoint, can be considered for hospital discharge.  [name], MD 12/22/20 2:29 PM

## 2020-12-22 NOTE — Progress Notes (Signed)
Progress Note    Mark [name]  [id number] DOB: February 23, 1961  DOA: 12/14/2020 PCP: Delrae Rend, MD    Brief Narrative:     Medical records reviewed and are as summarized below:  Mark [name] is an 60 y.o. male with a PMH ofchronic atrial flutter on Eliquis, CKD stage V, chronic diastolic CHF (EF 123456 by TTE 10/09/2020), anemia of chronic kidney disease, diet-controlled type 2 diabetes, recurrent left hip dislocationwho presents to the ED via EMS for evaluation of a 5 day h/o worsening dyspnea.  chest x-ray showed cardiomegaly with elevation of left hemidiaphragm and streaky airspace opacities in the lingula. The patient was given IV Lasix 80 mg once and the hospitalist service was consulted to admit for further evaluation and management. Cardiology and nephrology have been consulted to assist with management. Cardiology feels nothing to add given normal EF and advanced renal disease and prefers to let nephrology manage volume status issues.   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  -nephrology diuresing- changed to PO lasix on 2/13 -no need for urgent HD -started on midodrine for BP Support so diuretics can be given  Acute respiratory failure -wean to room air -81% documented on RA  prior -mobilize -off O2  Urinary retention, Acute, POA -foley placed 2/8 for acute urinary retention and removed 2/11 -flomax  MRSA Carrier, POA Decontamination prescribed.  AKI on CKD stage IV, POA Creatinine elevated compared to recent baseline, suspected to be related to hypervolemia. Continue Lasix diuresis as above and monitor closely. Patient has a left wrist aVF placed in 2017 but has not required initiation of dialysis. -nephrology consult appreciated  Chronic atrialflutter/acquired thrombophilia, POA Remains in atrial flutter with controlled rate.  -continue eliquis at appropriate dose-- monitor for bleeding - cardiology consult- Dr. Margaretann Loveless to see today -resumed low dose metoprolol- increase as tolerated  Anemia of chronic kidney disease, chronic, POA Chronic and stable. Patient reported seeing some minor bleeding several days ago therefore he held his Eliquis. increased eliquis to pharmacy recommend dose -mild bleeding but hgb stable  Diet controlled DM with renal complications, POA Chronic, controlled.  Hypercalcemia/hyperphosphatemia, chronic, related to CKD, POA Likely from secondary hyperparathyroidism related to CKD. Phosphorus elevated.   sevelamer     Benign essential HTN, chronic, POA -monitor closely on low dose metoprolol   Obesity Estimated body mass index is 36.1 kg/m as calculated from the following:   Height as of this encounter: '5\' 9"'$  (1.753 m).   Weight as of this encounter: 110.9 kg.  -prob need outpatient sleep apnea study   Family Communication/Anticipated D/C date and plan/Code Status   DVT prophylaxis: eliquis Code Status: Full Code.  Disposition Plan: Status is: Inpatient  Remains inpatient appropriate because:Inpatient level of care appropriate due to severity of illness   Dispo: The patient is from: Home  Anticipated d/c is to: Home              Anticipated d/c date is: d/c in AM after cards consult               Patient currently is not medically stable to d/c. cardiology consult   Difficult to place patient No   Medical Consultants:    Renal.  cards    Subjective:   Sleepy this AM  Objective:    Vitals:   12/22/20 0045 12/22/20 0422 12/22/20 0746 12/22/20 1056  BP:  111/79 100/61 118/88  Pulse:  80  (!) 116  Resp: '15 20 16   '$ Temp:  97.9 F (36.6 C) 97.8 F (36.6 C)   TempSrc:  Oral Oral   SpO2:  100%    Weight:  110.9 kg    Height:        Intake/Output Summary (Last 24 hours) at 12/22/2020 1221 Last data filed at 12/22/2020 1128 Gross per 24 hour  Intake 240 ml  Output 1800 ml  Net -1560 ml   Filed Weights   12/20/20 0602 12/21/20 0350 12/22/20 0422  Weight: 112.7 kg 110.7 kg 110.9 kg    Exam:  General: Appearance:    Obese male in no acute distress, in chair     Lungs:     respirations unlabored  Heart:    Tachycardic. Normal rhythm. No murmurs, rubs, or gallops.   MS:   All extremities are intact.   Neurologic:   Awake, alert, oriented x 3    Data Reviewed:   I have personally reviewed following labs and imaging studies:  Labs: Labs show the following:   Basic Metabolic Panel: Recent Labs  Lab 12/18/20 0026 12/19/20 0006 12/20/20 0147 12/21/20 0119 12/22/20 0031  NA 135 136 137 135 135  K 4.2 4.1 3.7 3.6 5.1  CL 94* 93* 92* 90* 94*  CO2 '28 29 30 30 27  '$ GLUCOSE 107* 82 94 95 86  BUN 111* 107* 100* 94* 97*  CREATININE 5.38* 4.94* 4.70* 4.43* 4.91*  CALCIUM 10.9* 11.1* 11.5* 11.2* 11.0*  PHOS 6.7* 6.5* 5.0* 4.5 4.3   GFR Estimated Creatinine Clearance: 19.4 mL/min (A) (by C-G formula based on SCr of 4.91 mg/dL (H)). Liver Function Tests: Recent Labs  Lab 12/18/20 0026 12/19/20 0006 12/20/20 0147 12/21/20 0119 12/22/20 0031  ALBUMIN 3.0* 3.2* 3.2* 3.1* 2.9*   No results for input(s): LIPASE, AMYLASE in the last 168 hours. No results for input(s): AMMONIA in the last 168 hours. Coagulation profile No results for input(s):  INR, PROTIME in the last 168 hours.  CBC: Recent Labs  Lab 12/17/20 0037 12/18/20 0457 12/19/20 0006  WBC 9.2 10.4 9.9  HGB 8.1* 8.8* 9.2*  HCT 26.5* 29.0* 29.2*  MCV 107.3* 108.2* 105.8*  PLT 159 146* 160   Cardiac Enzymes: No results for input(s): CKTOTAL, CKMB, CKMBINDEX, TROPONINI in the last 168 hours. BNP (last 3 results) No results for input(s): PROBNP in the last 8760 hours. CBG: Recent Labs  Lab 12/16/20 0332  GLUCAP 91   D-Dimer: No results for input(s): DDIMER in the last 72 hours. Hgb A1c: No results for input(s): HGBA1C in the last 72 hours. Lipid Profile: No results for input(s): CHOL, HDL, LDLCALC, TRIG, CHOLHDL, LDLDIRECT in the last 72 hours. Thyroid function studies: No results for input(s): TSH, T4TOTAL, T3FREE, THYROIDAB in the last 72 hours.  Invalid input(s): FREET3 Anemia work up: No results for input(s): VITAMINB12, FOLATE, FERRITIN, TIBC, IRON,  RETICCTPCT in the last 72 hours. Sepsis Labs: Recent Labs  Lab 12/17/20 0037 12/18/20 0457 12/19/20 0006  WBC 9.2 10.4 9.9    Microbiology Recent Results (from the past 240 hour(s))  SARS CORONAVIRUS 2 (TAT 6-24 HRS) Nasopharyngeal Nasopharyngeal Swab     Status: None   Collection Time: 12/14/20  5:47 PM   Specimen: Nasopharyngeal Swab  Result Value Ref Range Status   SARS Coronavirus 2 NEGATIVE NEGATIVE Final    Comment: (NOTE) SARS-CoV-2 target nucleic acids are NOT DETECTED.  The SARS-CoV-2 RNA is generally detectable in upper and lower respiratory specimens during the acute phase of infection. Negative results do not preclude SARS-CoV-2 infection, do not rule out co-infections with other pathogens, and should not be used as the sole basis for treatment or other patient management decisions. Negative results must be combined with clinical observations, patient history, and epidemiological information. The expected result is Negative.  Fact Sheet for  Patients: SugarRoll.be  Fact Sheet for Healthcare Providers: https://www.woods-mathews.com/  This test is not yet approved or cleared by the [place] FDA and  has been authorized for detection and/or diagnosis of SARS-CoV-2 by FDA under an Emergency Use Authorization (EUA). This EUA will remain  in effect (meaning this test can be used) for the duration of the COVID-19 declaration under Se ction 564(b)(1) of the Act, 21 U.S.C. section 360bbb-3(b)(1), unless the authorization is terminated or revoked sooner.  Performed at [facility], [place] [address], [place], [place] [address]   MRSA PCR Screening     Status: Abnormal   Collection Time: 12/15/20 11:30 PM   Specimen: Nasopharyngeal  Result Value Ref Range Status   MRSA by PCR POSITIVE (A) NEGATIVE Final    Comment:        The GeneXpert MRSA Assay (FDA approved for NASAL specimens only), is one component of a comprehensive MRSA colonization surveillance program. It is not intended to diagnose MRSA infection nor to guide or monitor treatment for MRSA infections. RESULT CALLED TO, READ BACK BY AND VERIFIED WITH: HAGERMAN,K RN [id number] 12/16/2020 MITCHELL,L Performed at [facility], [place] [address], [place], [place] [address]     Procedures and diagnostic studies:  No results found.  Medications:   . apixaban  5 mg Oral BID  . furosemide  80 mg Oral BID  . metoprolol tartrate  12.5 mg Oral BID  . midodrine  15 mg Oral TID WC  . sevelamer carbonate  800 mg Oral TID WC  . tamsulosin  0.4 mg Oral Daily   Continuous Infusions:    LOS: 7 days   Geradine Girt  [facility]   How to contact the [facility] Attending or Consulting provider [place] or covering provider during after hours [place], for this patient?  1. Check the care team in [facility] and look for a) attending/consulting [facility] provider listed and b) the [facility] team listed 2. Log into [email or web address] and use [facility]'s universal password to access. If you do not have the password, please contact the hospital operator. 3. Locate the [facility] provider you are looking for under [facility] and page to a number that you can be directly reached. 4. If you still have difficulty reaching the provider, please page the [facility] (Director on Call) for the Hospitalists listed on amion for assistance.  12/22/2020, 12:21 PM

## 2020-12-23 ENCOUNTER — Other Ambulatory Visit: Payer: Self-pay | Admitting: Internal Medicine

## 2020-12-23 DIAGNOSIS — I5033 Acute on chronic diastolic (congestive) heart failure: Secondary | ICD-10-CM | POA: Diagnosis not present

## 2020-12-23 DIAGNOSIS — I4892 Unspecified atrial flutter: Secondary | ICD-10-CM | POA: Diagnosis not present

## 2020-12-23 DIAGNOSIS — N184 Chronic kidney disease, stage 4 (severe): Secondary | ICD-10-CM | POA: Diagnosis not present

## 2020-12-23 LAB — RENAL FUNCTION PANEL
Albumin: 3 g/dL — ABNORMAL LOW (ref 3.5–5.0)
Anion gap: 11 (ref 5–15)
BUN: 93 mg/dL — ABNORMAL HIGH (ref 8–23)
CO2: 29 mmol/L (ref 22–32)
Calcium: 11.4 mg/dL — ABNORMAL HIGH (ref 8.9–10.3)
Chloride: 93 mmol/L — ABNORMAL LOW (ref 98–111)
Creatinine, Ser: 5.06 mg/dL — ABNORMAL HIGH (ref 0.61–1.24)
GFR, Estimated: 12 mL/min — ABNORMAL LOW (ref >60)
Glucose, Bld: 93 mg/dL (ref 70–99)
Phosphorus: 4.5 mg/dL (ref 2.5–4.6)
Potassium: 3.8 mmol/L (ref 3.5–5.1)
Sodium: 133 mmol/L — ABNORMAL LOW (ref 135–145)

## 2020-12-23 MED ORDER — FUROSEMIDE 80 MG PO TABS
80.0000 mg | ORAL_TABLET | Freq: Two times a day (BID) | ORAL | 1 refills | Status: AC
Start: 2020-12-23 — End: ?

## 2020-12-23 MED ORDER — METOPROLOL SUCCINATE ER 25 MG PO TB24: 12.5000 mg | ORAL_TABLET | Freq: Every day | ORAL | 1 refills | Status: DC

## 2020-12-23 MED ORDER — TAMSULOSIN HCL 0.4 MG PO CAPS: 0.4000 mg | ORAL_CAPSULE | Freq: Every day | ORAL | 1 refills | Status: AC

## 2020-12-23 MED ORDER — MIDODRINE HCL 5 MG PO TABS
15.0000 mg | ORAL_TABLET | Freq: Three times a day (TID) | ORAL | 1 refills | Status: AC
Start: 2020-12-23 — End: ?

## 2020-12-23 MED FILL — MIDODRINE HCL 5 MG TABS: 5 | 30 days supply | Qty: 270 | Fill #0

## 2020-12-23 MED FILL — FUROSEMIDE 80 MG TAB: 80 | 30 days supply | Qty: 60 | Fill #0

## 2020-12-23 MED FILL — METOPROLOL SUCCINATE ER 25: 25 | 60 days supply | Qty: 30 | Fill #0

## 2020-12-23 MED FILL — TAMSULOSIN HCL 0.4 MG CAP: 0.4 | 30 days supply | Qty: 30 | Fill #0

## 2020-12-23 NOTE — Discharge Summary (Addendum)
Physician Discharge Summary  Mark [name] [id number] DOB: 1961-01-16 DOA: 12/14/2020  PCP: Delrae Rend, MD  Admit date: 12/14/2020 Discharge date: 12/23/2020  Admitted From: home Discharge disposition: home   Recommendations for Outpatient Follow-Up:   1. BMP 1 week 2. Home health 3. Referral to EP 4. Outpatient sleep study   Discharge Diagnosis:   Principal Problem:   Acute on chronic diastolic (congestive) heart failure (HCC) Active Problems:   DM (diabetes mellitus) ([place]) with renal complications without long term use of insulin   Benign essential HTN   Acute renal failure superimposed on chronic kidney disease (HCC)   CKD (chronic kidney disease), stage IV (HCC)   Atrial flutter (HCC)   Hyponatremia   Hypercalcemia   Anemia of chronic disease   MRSA carrier   Hyperphosphatemia   Acute metabolic encephalopathy   Acquired thrombophilia ([place])    Discharge Condition: Improved.  Diet recommendation: Low sodium, heart healthy.  Carbohydrate-modified  Wound care: None.  Code status: Full.   History of Present Illness:   Mark [name] is a 60 y.o. male with medical history significant for chronic atrial flutter on Eliquis, CKD stage V, chronic diastolic CHF (EF 123456 by TTE 10/09/2020), anemia of chronic kidney disease, diet-controlled type 2 diabetes, recurrent left hip dislocation who presents to the ED for evaluation of dyspnea.  Patient reports 1 week of progressive shortness of breath, dyspnea on exertion, orthopnea, and lower extremity swelling.  He has been taking his home Lasix with adequate to slightly diminished urine output.  He has not been able to sleep well the last 3 days therefore came to the ED for further evaluation and management.  He denies any chest pain, palpitations, nausea, vomiting, abdominal pain, or dysuria.    Hospital Course by Problem:   Acute on chronic diastolic CHF exacerbation, POA associated with  hyponatremiaand acute encephalopathy (metabolic) EF 123456 by echo 10/09/20, with indeterminate diastolic parameters. -nephrology diuresing- changed to PO lasix on 2/13 -no need for urgent HD -started on midodrine for BP Support so diuretics can be given  Acute respiratory failure -wean to room air -81% documented on RA prior -mobilize -off O2  Urinary retention, Acute, POA -foley placed 2/8 for acute urinary retention and removed 2/11 -flomax  MRSA Carrier, POA Decontamination prescribed.  AKI on CKD stage IV, POA Creatinine elevated compared to recent baseline, suspected to be related to hypervolemia. Continue Lasix diuresis as above and monitor closely. Patient has a left wrist aVF placed in 2017 but has not required initiation of dialysis. -nephrology consult appreciated  Chronic atrialflutter/acquired thrombophilia, POA Remains in atrial flutter with controlled rate.  -continue eliquis at appropriate dose-- monitor for bleeding - cardiology consult appreciated -resumed low dose metoprolol- increase as tolerated  Anemia of chronic kidney disease, chronic, POA Chronic and stable. Patient reported seeing some minor bleeding several days ago therefore he held his Eliquis.increased eliquis to pharmacy recommend dose -mild bleeding but hgb stable  Diet controlled DM with renal complications, POA Chronic, controlled.  Hypercalcemia/hyperphosphatemia, chronic, related to CKD,POA Likely from secondary hyperparathyroidism related to CKD.Phosphorus elevated. sevelamer  Benign essential HTN, chronic, POA -monitor closely on low dose metoprolol   Obesity Estimated body mass index is 36.1 kg/m as calculated from the following:   Height as of this encounter: '5\' 9"'$  (1.753 m).   Weight as of this encounter: 110.9 kg.  -prob need outpatient sleep apnea study     Medical Consultants:    Cards Nephrology  Discharge Exam:   Vitals:   12/23/20  0802 12/23/20 0922  BP: 96/69 101/79  Pulse: 92 92  Resp: 18   Temp: 98.4 F (36.9 C)   SpO2: 99%    Vitals:   12/22/20 2012 12/22/20 2333 12/23/20 0802 12/23/20 0922  BP: 104/82 122/87 96/69 101/79  Pulse: (!) 113 81 92 92  Resp: 20 (!) 26 18   Temp: 98.2 F (36.8 C) 98.5 F (36.9 C) 98.4 F (36.9 C)   TempSrc: Oral Oral Oral   SpO2: 97% 97% 99%   Weight:  109.2 kg    Height:        General exam: Appears calm and comfortable.     The results of significant diagnostics from this hospitalization (including imaging, microbiology, ancillary and laboratory) are listed below for reference.     Procedures and Diagnostic Studies:   DG Chest 2 View  Result Date: 12/14/2020 CLINICAL DATA:  Shortness of breath on exertion. EXAM: CHEST - 2 VIEW COMPARISON:  October 09, 2020 FINDINGS: Cardiac silhouette is enlarged.  Mediastinal contours appear intact. Elevation of the left hemidiaphragm. Streaky airspace opacities in the lingula. Osseous structures are without acute abnormality. Soft tissues are grossly normal. IMPRESSION: Streaky airspace opacities in the lingula, may represent atelectasis or peribronchial airspace consolidation. Enlarged cardiac silhouette. Electronically Signed   By: Fidela Salisbury M.D.   On: 12/14/2020 15:52     Labs:   Basic Metabolic Panel: Recent Labs  Lab 12/19/20 0006 12/20/20 0147 12/21/20 0119 12/22/20 0031 12/23/20 0028  NA 136 137 135 135 133*  K 4.1 3.7 3.6 5.1 3.8  CL 93* 92* 90* 94* 93*  CO2 '29 30 30 27 29  '$ GLUCOSE 82 94 95 86 93  BUN 107* 100* 94* 97* 93*  CREATININE 4.94* 4.70* 4.43* 4.91* 5.06*  CALCIUM 11.1* 11.5* 11.2* 11.0* 11.4*  PHOS 6.5* 5.0* 4.5 4.3 4.5   GFR Estimated Creatinine Clearance: 18.7 mL/min (A) (by C-G formula based on SCr of 5.06 mg/dL (H)). Liver Function Tests: Recent Labs  Lab 12/19/20 0006 12/20/20 0147 12/21/20 0119 12/22/20 0031 12/23/20 0028  ALBUMIN 3.2* 3.2* 3.1* 2.9* 3.0*   No results for  input(s): LIPASE, AMYLASE in the last 168 hours. No results for input(s): AMMONIA in the last 168 hours. Coagulation profile No results for input(s): INR, PROTIME in the last 168 hours.  CBC: Recent Labs  Lab 12/17/20 0037 12/18/20 0457 12/19/20 0006  WBC 9.2 10.4 9.9  HGB 8.1* 8.8* 9.2*  HCT 26.5* 29.0* 29.2*  MCV 107.3* 108.2* 105.8*  PLT 159 146* 160   Cardiac Enzymes: No results for input(s): CKTOTAL, CKMB, CKMBINDEX, TROPONINI in the last 168 hours. BNP: Invalid input(s): POCBNP CBG: No results for input(s): GLUCAP in the last 168 hours. D-Dimer No results for input(s): DDIMER in the last 72 hours. Hgb A1c No results for input(s): HGBA1C in the last 72 hours. Lipid Profile No results for input(s): CHOL, HDL, LDLCALC, TRIG, CHOLHDL, LDLDIRECT in the last 72 hours. Thyroid function studies No results for input(s): TSH, T4TOTAL, T3FREE, THYROIDAB in the last 72 hours.  Invalid input(s): FREET3 Anemia work up No results for input(s): VITAMINB12, FOLATE, FERRITIN, TIBC, IRON, RETICCTPCT in the last 72 hours. Microbiology Recent Results (from the past 240 hour(s))  SARS CORONAVIRUS 2 (TAT 6-24 HRS) Nasopharyngeal Nasopharyngeal Swab     Status: None   Collection Time: 12/14/20  5:47 PM   Specimen: Nasopharyngeal Swab  Result Value Ref Range Status   SARS Coronavirus  2 NEGATIVE NEGATIVE Final    Comment: (NOTE) SARS-CoV-2 target nucleic acids are NOT DETECTED.  The SARS-CoV-2 RNA is generally detectable in upper and lower respiratory specimens during the acute phase of infection. Negative results do not preclude SARS-CoV-2 infection, do not rule out co-infections with other pathogens, and should not be used as the sole basis for treatment or other patient management decisions. Negative results must be combined with clinical observations, patient history, and epidemiological information. The expected result is Negative.  Fact Sheet for  Patients: SugarRoll.be  Fact Sheet for Healthcare Providers: https://www.woods-mathews.com/  This test is not yet approved or cleared by the [place] FDA and  has been authorized for detection and/or diagnosis of SARS-CoV-2 by FDA under an Emergency Use Authorization (EUA). This EUA will remain  in effect (meaning this test can be used) for the duration of the COVID-19 declaration under Se ction 564(b)(1) of the Act, 21 U.S.C. section 360bbb-3(b)(1), unless the authorization is terminated or revoked sooner.  Performed at [facility], [place] [address], [place], [place] [address]   MRSA PCR Screening     Status: Abnormal   Collection Time: 12/15/20 11:30 PM   Specimen: Nasopharyngeal  Result Value Ref Range Status   MRSA by PCR POSITIVE (A) NEGATIVE Final    Comment:        The GeneXpert MRSA Assay (FDA approved for NASAL specimens only), is one component of a comprehensive MRSA colonization surveillance program. It is not intended to diagnose MRSA infection nor to guide or monitor treatment for MRSA infections. RESULT CALLED TO, READ BACK BY AND VERIFIED WITH: HAGERMAN,K RN [id number] 12/16/2020 MITCHELL,L Performed at [facility], [place] [address] [place], [place] [address]      Discharge Instructions:   Discharge Instructions    (HEART FAILURE PATIENTS) Call MD:  Anytime you have any of the following symptoms: 1) 3 pound weight gain in 24 hours or 5 pounds in 1 week 2) shortness of breath, with or without a dry hacking cough 3) swelling in the hands, feet or stomach 4) if you have to sleep on extra pillows at night in order to breathe.   Complete by: As directed    Diet - low sodium heart healthy   Complete by: As directed    Diet Carb Modified   Complete by: As directed    Heart Failure patients record your daily weight using the same scale at the same time of day   Complete by: As directed    Increase activity slowly    Complete by: As directed      Allergies as of 12/23/2020      Reactions   Penicillins Swelling, Rash, Other (See Comments)   Arms and eyes swell & skin breaks out  Has patient had a PCN reaction causing immediate rash, facial/tongue/throat swelling, SOB or lightheadedness with hypotension: Yes Has patient had a PCN reaction causing severe rash involving mucus membranes or skin necrosis: Yes Has patient had a PCN reaction that required hospitalization: No Has patient had a PCN reaction occurring within the last 10 years: No If all of the above answers are "NO", then may proceed with Cephalosporin use.   Atorvastatin    Other reaction(s): Unknown   Penicillin G    Unknown   Rosuvastatin    Other reaction(s): Unknown      Medication List    STOP taking these medications   allopurinol 100 MG tablet Commonly known as: ZYLOPRIM  sodium bicarbonate 650 MG tablet     TAKE these medications   apixaban 5 MG Tabs tablet Commonly known as: ELIQUIS Take 1 tablet (5 mg total) by mouth 2 (two) times daily.   ferrous sulfate 325 (65 FE) MG tablet Take 1 tablet (325 mg total) by mouth daily with breakfast.   furosemide 80 MG tablet Commonly known as: LASIX Take 1 tablet (80 mg total) by mouth 2 (two) times daily. What changed:   medication strength  how much to take   loratadine 10 MG tablet Commonly known as: CLARITIN Take 10 mg by mouth daily.   metoprolol succinate 25 MG 24 hr tablet Commonly known as: TOPROL-XL Take 0.5 tablets (12.5 mg total) by mouth daily. Take with or immediately following a meal. What changed:   medication strength  how much to take   midodrine 5 MG tablet Commonly known as: PROAMATINE Take 3 tablets (15 mg total) by mouth 3 (three) times daily with meals.   multivitamin with minerals Tabs tablet Take 1 tablet by mouth daily after breakfast.   polyethylene glycol 17 g packet Commonly known as: MIRALAX / GLYCOLAX Take 17 g by mouth 2  (two) times daily as needed for mild constipation.   sevelamer carbonate 800 MG tablet Commonly known as: RENVELA Take 1 tablet (800 mg total) by mouth 3 (three) times daily with meals.   tamsulosin 0.4 MG Caps capsule Commonly known as: FLOMAX Take 1 capsule (0.4 mg total) by mouth daily. Start taking on: December 24, 2020   vitamin B-12 1000 MCG tablet Commonly known as: CYANOCOBALAMIN Take 1,000 mcg by mouth daily.       Follow-up Information    [facility] Follow up.   Specialty: Home Health Services Why: HHPT Contact information: [place] [address] [phone]        [name], MD Follow up on 01/14/2021.   Specialties: Cardiology, Radiology Why: at 3:40 pm  Contact information: [place] [address] [phone]        Vickie Epley, MD Follow up.   Specialties: Cardiology, Radiology Why: his office is arranging appt Contact information: [place] [address] [phone]                Time coordinating discharge: 35 min  Signed:  Geradine Girt DO  [facility] 12/23/2020, 10:12 AM

## 2020-12-23 NOTE — Progress Notes (Signed)
Progress Note  Patient Name: RAYMERE FRAN Date of Encounter: 12/23/2020  Primary Cardiologist: [name], MD   Subjective   Appears fatigued but overall stable. No CP, no palpitations.  Inpatient Medications    Scheduled Meds: . apixaban  5 mg Oral BID  . furosemide  80 mg Oral BID  . metoprolol tartrate  12.5 mg Oral BID  . midodrine  15 mg Oral TID WC  . sevelamer carbonate  800 mg Oral TID WC  . tamsulosin  0.4 mg Oral Daily   Continuous Infusions:  PRN Meds: acetaminophen **OR** acetaminophen, ondansetron **OR** ondansetron (ZOFRAN) IV   Vital Signs    Vitals:   12/22/20 2012 12/22/20 2333 12/23/20 0802 12/23/20 0922  BP: 104/82 122/87 96/69 101/79  Pulse: (!) 113 81 92 92  Resp: 20 (!) 26 18   Temp: 98.2 F (36.8 C) 98.5 F (36.9 C) 98.4 F (36.9 C)   TempSrc: Oral Oral Oral   SpO2: 97% 97% 99%   Weight:  109.2 kg    Height:        Intake/Output Summary (Last 24 hours) at 12/23/2020 0950 Last data filed at 12/23/2020 0800 Gross per 24 hour  Intake 480 ml  Output 2500 ml  Net -2020 ml   Filed Weights   12/21/20 0350 12/22/20 0422 12/22/20 2333  Weight: 110.7 kg 110.9 kg 109.2 kg    Telemetry    afib rates 90-110  - Personally Reviewed  ECG    No new - Personally Reviewed  Physical Exam   GEN: No acute distress.   Neck: No JVD Cardiac: irregular rhythm, normal rate, no murmurs, rubs, or gallops.  Respiratory: Clear to auscultation bilaterally. GI: Soft, nontender, non-distended  MS: 1+ bilateral edema; No deformity. Neuro:  Nonfocal  Psych: Normal affect   Labs    Chemistry Recent Labs  Lab 12/21/20 0119 12/22/20 0031 12/23/20 0028  NA 135 135 133*  K 3.6 5.1 3.8  CL 90* 94* 93*  CO2 '30 27 29  '$ GLUCOSE 95 86 93  BUN 94* 97* 93*  CREATININE 4.43* 4.91* 5.06*  CALCIUM 11.2* 11.0* 11.4*  ALBUMIN 3.1* 2.9* 3.0*  GFRNONAA 14* 13* 12*  ANIONGAP '15 14 11     '$ Hematology Recent Labs  Lab 12/17/20 0037  12/18/20 0457 12/19/20 0006  WBC 9.2 10.4 9.9  RBC 2.47* 2.68* 2.76*  HGB 8.1* 8.8* 9.2*  HCT 26.5* 29.0* 29.2*  MCV 107.3* 108.2* 105.8*  MCH 32.8 32.8 33.3  MCHC 30.6 30.3 31.5  RDW 16.4* 16.6* 16.8*  PLT 159 146* 160    Cardiac EnzymesNo results for input(s): TROPONINI in the last 168 hours. No results for input(s): TROPIPOC in the last 168 hours.   BNPNo results for input(s): BNP, PROBNP in the last 168 hours.   DDimer No results for input(s): DDIMER in the last 168 hours.   Radiology    No results found.    Patient Profile     FORD MATKOWSKI is a 60 y.o. male with a hx of moderate AS, CKD V, HTN, gout, Persistent a flutter, DM-2,  and hx of hip dislocations and admitted 12/14/20 with acute on chronic diastolic CHF, acute respiratory failure who is being seen today for the evaluation of atrial flutter at the request of Dr. Eliseo Squires.  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  Continues on Eliquis 5 mg twice daily but stops taking the medication when he feels like he has skin bleeding or pink-tinged urine, and we reviewed that this does not represent life-threatening bleeding.  It will make it more challenging for [place] to plan for TEE cardioversion if I am unable to ensure consistent anticoagulation.  At this point it may be best to visit with EP for discussion of options and consideration of watchman implant.  I  will refer him to Dr. Quentin Ore.  Continue beta-blocker as tolerated.  Acute on chronic diastolic heart failure-diuresed well while in hospital, now on 80 mg of Lasix p.o. twice daily. Diuretics per nephrology.  Chronic kidney disease stage IV - per nephrology, not yet requiring dialysis.       For questions or updates, please contact [place] Please consult [email or web address] for contact info under        Signed, [name], MD  12/23/2020, 9:50 AM

## 2020-12-23 NOTE — Progress Notes (Signed)
Pt provided with detail verbal discharge instructions. RN answered all questions. VSS at discharge. IV removed. TOC pharmacy brought prescriptions to bedside.  Pt belongings sent with patient. NT discharged pt via wheelchair to [employer] entrance to private vehicle.

## 2020-12-23 NOTE — Progress Notes (Signed)
Heart Failure Navigator Progress Note  Pt admitted for acute CHF. Pt does not meet criteria for H&V TOC clinic at this time d/t increased SCr of 5.06  ECHO EF: 60-65% (12/21)  Navigator available as needed for reassessment of H&V TOC readiness.  Pricilla Holm, RN, BSN Heart Failure Nurse Navigator [phone]

## 2020-12-23 NOTE — Plan of Care (Signed)

## 2020-12-23 NOTE — TOC Transition Note (Signed)
Transition of Care [facility]) - CM/SW Discharge Note   Patient Details  Name: Mark [name] MRN: [id number] Date of Birth: [birth year]  [facility]) CM/SW Contact:  Zenon Mayo, RN Phone Number: 12/23/2020, 11:07 AM   Clinical Narrative:    Patient is for dc today, Cory notified.  He has no other needs.   Final next level of care: [place] Barriers to Discharge: No Barriers Identified   Patient Goals and CMS Choice Patient states their goals for this hospitalization and ongoing recovery are:: get better CMS Medicare.gov Compare Post Acute Care list provided to:: Patient Choice offered to / list presented to : Patient  Discharge Placement                       Discharge Plan and Services In-house Referral: NA Discharge Planning Services: CM Consult Post Acute Care Choice: Home Health            DME Agency: NA       HH Arranged: PT HH Agency: [place] Date [facility] Agency Contacted: 12/19/20 Time [place]: 1647 Representative spoke with at [place]: [place] ([place]) Interventions     Readmission Risk Interventions No flowsheet data found.

## 2020-12-23 NOTE — Plan of Care (Signed)

## 2020-12-24 ENCOUNTER — Telehealth: Payer: Self-pay | Admitting: Internal Medicine

## 2020-12-24 NOTE — Telephone Encounter (Signed)
Returned call to [place] from [facility]. Mendel Ryder states that this morning patient's blood pressure is 117/81 after taking Metoprolol and without taking the Midodrine that was prescribed at Discharge. Mendel Ryder wanted to make sure Dr. Margaretann Loveless was okay with patient taking both medications and see if there are any parameters for patient to hold the Metoprolol or Hold the Midodrine.   Ernestene Mention that Per Discharge Summary the Midodrine was for BP support so patient can continue Diuretics, and low dose Metoprolol was for rate control for Afib/Flutter. Discharge instructions to monitor BP closely. Mendel Ryder would like to make sure Dr. Margaretann Loveless is aware of this.   Mendel Ryder also wanted to clarify why patient was going to see 2 cardiologists in reference to the EP referral to Dr. Quentin Ore. Ernestene Mention that Dr. Margaretann Loveless was patients general cardiologist and that Dr. Quentin Ore is seeing patient for Electrophysiology due to patients Afib/flutter to possibly discuss Watchman Procedure.   Ernestene Mention that I would forward this message to Dr. Margaretann Loveless and will get back with her upon response. Mendel Ryder verbalized understanding.   Will forward message to Dr. Margaretann Loveless to see if any parameters should be placed on the Midodrine and Metoprolol.

## 2020-12-24 NOTE — Telephone Encounter (Signed)
Ria Comment, RN with [facility] called in with the patient on the line regarding medications prescribed when patient was discharged. She would like to know if Dr. Margaretann Loveless is aware that the patient has been prescribed Midodrine and Metoprolol. If so, she would like to discuss whether or not the patient needs to continue taking both medications. Ria Comment would also like to discuss the purpose of the patient's watchman consult with Dr. Quentin Ore. Please contact the patient to clarify as well.  Lindsay's phone #: [phone] (ext: [phone])

## 2020-12-25 NOTE — Telephone Encounter (Signed)
Pt made aware of Pharm D recommendations and verbalized understanding. Appointment scheduled for 12/30/20 at 3:15 pm.

## 2020-12-25 NOTE — Telephone Encounter (Signed)
Pt c/o medication issue:  1. Name of Medication:   apixaban (ELIQUIS) 5 MG TABS tablet   2. How are you currently taking this medication (dosage and times per day)? As prescribed   3. Are you having a reaction (difficulty breathing--STAT)? No   4. What is your medication issue? Mendel Ryder is calling back with [place] due to not hearing back in regards to this. I advised them both Dr. Margaretann Loveless has not yet given her recommendation for [facility] to callback. Mendel Ryder requested we add to the note informing Dr. Eileen Stanford had a nose bleed last night that lasted for 3 hours. She is requesting recommendations for [place] in regards to this if it happens in the future and it does not stop due to him being on Eliquis. Please advise.

## 2020-12-25 NOTE — Telephone Encounter (Signed)
Midodrine and metoprolol questions were answered by Eliezer Lofts RN  Midodrine is for hypotension. Low dose metoprolol has none/little  effect in blood pressure and currently used for HR control. Patient also follow up with electrophysician for A.Fib/Flutter management.  LMOM for nurse to call back and clarify any additional questions.    *Please schedule earlier f/u appointment with Dr.Acharya or APP for re-assessment of anticoagulation issues.   Continue using eliquis for now, and keep nares moist with saline solution or saline gel to decrease potential for repeat nose bleed. May use Afrin nasal spray to help with any nosebleed and avoid blowing nose*

## 2020-12-30 ENCOUNTER — Other Ambulatory Visit: Payer: Self-pay

## 2020-12-30 ENCOUNTER — Ambulatory Visit: Payer: 59 | Admitting: Physician Assistant

## 2020-12-30 ENCOUNTER — Encounter: Payer: Self-pay | Admitting: Physician Assistant

## 2020-12-30 VITALS — BP 96/68 | HR 107 | Ht 69.0 in | Wt 248.6 lb

## 2020-12-30 DIAGNOSIS — N185 Chronic kidney disease, stage 5: Secondary | ICD-10-CM | POA: Diagnosis not present

## 2020-12-30 DIAGNOSIS — I4892 Unspecified atrial flutter: Secondary | ICD-10-CM | POA: Diagnosis not present

## 2020-12-30 DIAGNOSIS — I350 Nonrheumatic aortic (valve) stenosis: Secondary | ICD-10-CM

## 2020-12-30 DIAGNOSIS — I959 Hypotension, unspecified: Secondary | ICD-10-CM

## 2020-12-30 NOTE — Patient Instructions (Addendum)
Medication Instructions:  Your physician recommends that you continue on your current medications as directed. Please refer to the Current Medication list given to you today.  *If you need a refill on your cardiac medications before your next appointment, please call your pharmacy*  Lab Work: NONE ordered at this time of appointment   If you have labs (blood work) drawn today and your tests are completely normal, you will receive your results only by: Marland Kitchen MyChart Message (if you have MyChart) OR . A paper copy in the mail If you have any lab test that is abnormal or we need to change your treatment, we will call you to review the results.  Testing/Procedures: NONE ordered at this time of appointment   Follow-Up: At [facility], you and your health needs are our priority.  As part of our continuing mission to provide you with exceptional heart care, we have created designated Provider Care Teams.  These Care Teams include your primary Cardiologist (physician) and Advanced Practice Providers (APPs -  Physician Assistants and Nurse Practitioners) who all work together to provide you with the care you need, when you need it.  We recommend signing up for the patient portal called "MyChart".  Sign up information is provided on this After Visit Summary.  MyChart is used to connect with patients for Virtual Visits (Telemedicine).  Patients are able to view lab/test results, encounter notes, upcoming appointments, etc.  Non-urgent messages can be sent to your provider as well.   To learn more about what you can do with MyChart, go to [email or web address].    Your next appointment:    As scheduled -01/14/21  The format for your next appointment:   In Person  Provider:   Cherlynn Kaiser, MD  Other Instructions

## 2020-12-30 NOTE — Progress Notes (Signed)
Cardiology Office Note:    Date:  12/31/2020   ID:  Mark [name], DOB 12-Jun-1961, MRN [id number]  PCP:  Delrae Rend, MD   [place]  Cardiologist:  Mark Munroe, MD  Advanced Practice Provider:  No care team member to display Electrophysiologist:  None  Nephrology: Dr. Posey Pronto   Referring MD: Delrae Rend, MD   Chief Complaint  Patient presents with  . Follow-up    Seen for Mark [name]    History of Present Illness:    Mark [name] is a 60 y.o. [name] with a hx of moderate aortic stenosis, CKD stage V, hypertension, atrial flutter and recurrent hip dislocation.  She had a dislocation of the left hip in December 2021 and had atrial flutter with RVR.  He was planned to have outpatient cardioversion due to interruption of anticoagulation prior to hospitalization.  Patient was last seen by Mark [name] on 12/05/2020 at which time he has interrupted Eliquis due to bleeding issue.  Mark [name] recommended resume Eliquis and plan to see the patient back in 1 month to consider cardioversion. Unfortunately, she was admitted to the hospital on 12/14/2020 with acute on chronic diastolic heart failure. Nephrology service was consulted due to poor underlying renal function. Blood pressure was low and was placed on midodrine. Eliquis was temporarily reduced to 2.5 mg twice a day due to poor renal function and eventually went up to 5 mg twice a day. She was also placed on 15 mg 3 times daily of midodrine due to hypotension. It wasn't mentioned that patient has not been 100% compliant with Eliquis and sometimes stop it for nonlife-threatening bleeding such as skin bleeding pink-tinged urine. Planning for TEE cardioversion would be difficult with only partial compliance with Eliquis. Patient was referred to EP service as outpatient to discuss other options and consider watchman device. Discharge weight 240.7 pounds.  Patient presents today for follow-up.  He appears to be  euvolemic.  Even though today's weight was 248 pounds, his home weight has been stable around 238 pounds.  He denies any chest pain or shortness of breath.  EKG suggesting inferior STEMI, however I do not see any ST elevation in the inferior leads.  He is still in atrial flutter with poorly controlled heart rate of 100-110s.  EKG has been reviewed by DOD Dr. Gwenlyn Found who also do not feel this is a STEMI EKG.  Patient has no chest pain.  He does have a left shoulder pain that is worse when he is laying on the left side.  It is not entirely clear to me why his blood pressure was stable prior to hospitalization on 100 mg daily of Toprol-XL and now he require 15 mg 3 times daily dosing on midodrine even though he is only on 12.5 mg daily of Toprol-XL.  I am unable to uptitrate rate control medication   Past Medical History:  Diagnosis Date  . Anemia    labs today indicate (09/27/11)  . Arthritis    right knee  . Bleeding ulcer    hx of, required 6 units blood  . Blood transfusion    6 units with ulcer repair surgery  . Chronic kidney disease    stage IV, has fistula, never started diaylsis  . Complication of anesthesia    little slow to wake up with last surgery  . Dyspnea    with exertion  . Gout   . Hip dislocation, left ([place]) 10/09/2020  . Hypertension   .  Osteoporosis   . Pneumonia     Past Surgical History:  Procedure Laterality Date  . AV FISTULA PLACEMENT Left 09/07/2016   Procedure: ARTERIOVENOUS (AV) FISTULA CREATION LEFT ARM;  Surgeon: Angelia Mould, MD;  Location: [facility];  Service: Vascular;  Laterality: Left;  . COLONOSCOPY    . EXTERNAL EAR SURGERY    . HIP CLOSED REDUCTION Left 07/27/2020   Procedure: CLOSED REDUCTION HIP;  Surgeon: Marchia Bond, MD;  Location: [facility];  Service: Orthopedics;  Laterality: Left;  . HIP PINNING  11/2010  . JOINT REPLACEMENT    . OTHER SURGICAL HISTORY  ~ 08/1998   repair bleeding ulcer  . SHOULDER ARTHROSCOPY WITH ROTATOR CUFF REPAIR  AND SUBACROMIAL DECOMPRESSION Right 05/26/2017   Procedure: SHOULDER ARTHROSCOPY WITH ROTATOR CUFF REPAIR AND SUBACROMIAL DECOMPRESSION;  Surgeon: Tania Ade, MD;  Location: [facility];  Service: Orthopedics;  Laterality: Right;  SHOULDER ARTHROSCOPY WITH ROTATOR CUFF REPAIR AND SUBACROMIAL DECOMPRESSION  . TOTAL HIP ARTHROPLASTY  10/06/11   left  . TOTAL HIP ARTHROPLASTY  10/06/2011   Procedure: TOTAL HIP ARTHROPLASTY;  Surgeon: Kerin Salen;  Location: [facility];  Service: Orthopedics;  Laterality: Left;    Current Medications: Current Meds  Medication Sig  . apixaban (ELIQUIS) 5 MG TABS tablet Take 1 tablet (5 mg total) by mouth 2 (two) times daily.  . ferrous sulfate 325 (65 FE) MG tablet Take 1 tablet (325 mg total) by mouth daily with breakfast.  . furosemide (LASIX) 80 MG tablet Take 1 tablet (80 mg total) by mouth 2 (two) times daily.  Marland Kitchen loratadine (CLARITIN) 10 MG tablet Take 10 mg by mouth daily.  . midodrine (PROAMATINE) 5 MG tablet Take 3 tablets (15 mg total) by mouth 3 (three) times daily with meals.  . Multiple Vitamin (MULTIVITAMIN WITH MINERALS) TABS tablet Take 1 tablet by mouth daily after breakfast.  . polyethylene glycol (MIRALAX / GLYCOLAX) 17 g packet Take 17 g by mouth 2 (two) times daily as needed for mild constipation.  . sevelamer carbonate (RENVELA) 800 MG tablet Take 1 tablet (800 mg total) by mouth 3 (three) times daily with meals.  . tamsulosin (FLOMAX) 0.4 MG CAPS capsule Take 1 capsule (0.4 mg total) by mouth daily.  . vitamin B-12 (CYANOCOBALAMIN) 1000 MCG tablet Take 1,000 mcg by mouth daily.  . [DISCONTINUED] metoprolol succinate (TOPROL-XL) 25 MG 24 hr tablet Take 0.5 tablets (12.5 mg total) by mouth daily. Take with or immediately following a meal.     Allergies:   Penicillins, Atorvastatin, Penicillin g, and Rosuvastatin   Social History   Socioeconomic History  . Marital status: Single    Spouse name: Not on file  . Number of children: Not on file   . Years of education: Not on file  . Highest education level: Not on file  Occupational History  . Not on file  Tobacco Use  . Smoking status: Current Some Day Smoker    Packs/day: 0.25    Years: 32.00    Pack years: 8.00    Types: Cigarettes  . Smokeless tobacco: Former Systems developer    Types: Chew    Quit date: 75  . Tobacco comment: smoking cessation consult entered  Vaping Use  . Vaping Use: Never used  Substance and Sexual Activity  . Alcohol use: Yes    Alcohol/week: 5.0 standard drinks    Types: 5 Cans of beer per week    Comment: occasional  . Drug use: No  . Sexual activity:  Yes  Other Topics Concern  . Not on file  Social History Narrative  . Not on file   Social Determinants of Health   Financial Resource Strain: Not on file  Food Insecurity: Not on file  Transportation Needs: Not on file  Physical Activity: Not on file  Stress: Not on file  Social Connections: Not on file     Family History: The patient's family history includes Atrial fibrillation in his mother; Cancer in his father and mother; Hypertension in his mother.  ROS:   Please see the history of present illness.     All other systems reviewed and are negative.  EKGs/Labs/Other Studies Reviewed:    The following studies were reviewed today:  Echo 10/09/2020 1. No subcostal views.  2. Left ventricular ejection fraction, by estimation, is 60 to 65%. The  left ventricle has normal function. The left ventricle has no regional  wall motion abnormalities. There is mild left ventricular hypertrophy.  Left ventricular diastolic parameters  are indeterminate.  3. Right ventricular systolic function is normal. The right ventricular  size is normal.  4. Left atrial size was severely dilated.  5. The mitral valve is normal in structure. Mild mitral valve  regurgitation. No evidence of mitral stenosis. Severe mitral annular  calcification.  6. The aortic valve is normal in structure. Aortic valve  regurgitation is  trivial. Moderate aortic valve stenosis.   EKG:  EKG is ordered today.  The ekg ordered today demonstrates atrial flutter, reviewed with DOD Dr. Gwenlyn Found  Recent Labs: 07/28/2020: ALT 17 10/08/2020: TSH 1.804 10/16/2020: Magnesium 1.8 12/14/2020: B Natriuretic Peptide 1,930.2 12/19/2020: Hemoglobin 9.2; Platelets 160 12/23/2020: BUN 93; Creatinine, Ser 5.06; Potassium 3.8; Sodium 133  Recent Lipid Panel No results found for: CHOL, TRIG, HDL, CHOLHDL, VLDL, LDLCALC, LDLDIRECT   Risk Assessment/Calculations:    CHA2DS2-VASc Score = 3  This indicates a 3.2% annual risk of stroke. The patient's score is based upon: CHF History: Yes HTN History: Yes Diabetes History: Yes Stroke History: No Vascular Disease History: No Age Score: 0 Gender Score: 0      Physical Exam:    VS:  BP 96/68 (BP Location: Right Arm, Patient Position: Sitting)   Pulse (!) 107   Ht '5\' 9"'$  (1.753 m)   Wt 248 lb 9.6 oz (112.8 kg)   SpO2 99%   BMI 36.71 kg/m     Wt Readings from Last 3 Encounters:  12/30/20 248 lb 9.6 oz (112.8 kg)  12/22/20 240 lb 11.9 oz (109.2 kg)  12/05/20 254 lb 12.8 oz (115.6 kg)     GEN:  Well nourished, well developed in no acute distress HEENT: Normal NECK: No JVD; No carotid bruits LYMPHATICS: No lymphadenopathy CARDIAC: Irregularly irregular, no rubs, gallops  +3/6 murmur RESPIRATORY:  Clear to auscultation without rales, wheezing or rhonchi  ABDOMEN: Soft, non-tender, non-distended MUSCULOSKELETAL:  No edema; No deformity  SKIN: Warm and dry NEUROLOGIC:  Alert and oriented x 3 PSYCHIATRIC:  Normal affect   ASSESSMENT:    1. Hypotension, unspecified hypotension type   2. Atrial flutter with rapid ventricular response ([place])   3. Aortic valve stenosis, etiology of cardiac valve disease unspecified   4. Chronic kidney disease (CKD), stage V ([place])    PLAN:    In order of problems listed above:  1. Hypotension: On 15 mg 3 times daily dosing of  midodrine.  Systolic blood pressure still in the 90s.  Unclear cause of recent hypotension.  2. Atrial flutter: Prior  to recent admission, he was on 100 mg daily of Toprol-XL, now he is on 12.5 mg daily of Toprol-XL.  He is hypotensive despite on 15 mg 3 times daily dosing of midodrine.  I am unable to uptitrate rate control therapy.  Fortunately his heart rate is around 100-110 at rest, current heart rate is not ideal but acceptable given his current condition  3. Aortic stenosis: Moderate aortic stenosis seen on recent echocardiogram  4. CKD stage V: Followed by nephrology service.  He has follow-up with his nephrologist in 2 days, I will defer to them for lab work.        Medication Adjustments/Labs and Tests Ordered: Current medicines are reviewed at length with the patient today.  Concerns regarding medicines are outlined above.  Orders Placed This Encounter  Procedures  . EKG 12-Lead   No orders of the defined types were placed in this encounter.   Patient Instructions  Medication Instructions:  Your physician recommends that you continue on your current medications as directed. Please refer to the Current Medication list given to you today.  *If you need a refill on your cardiac medications before your next appointment, please call your pharmacy*  Lab Work: NONE ordered at this time of appointment   If you have labs (blood work) drawn today and your tests are completely normal, you will receive your results only by: Marland Kitchen MyChart Message (if you have MyChart) OR . A paper copy in the mail If you have any lab test that is abnormal or we need to change your treatment, we will call you to review the results.  Testing/Procedures: NONE ordered at this time of appointment   Follow-Up: At [facility], you and your health needs are our priority.  As part of our continuing mission to provide you with exceptional heart care, we have created designated Provider Care Teams.  These  Care Teams include your primary Cardiologist (physician) and Advanced Practice Providers (APPs -  Physician Assistants and Nurse Practitioners) who all work together to provide you with the care you need, when you need it.  We recommend signing up for the patient portal called "MyChart".  Sign up information is provided on this After Visit Summary.  MyChart is used to connect with patients for Virtual Visits (Telemedicine).  Patients are able to view lab/test results, encounter notes, upcoming appointments, etc.  Non-urgent messages can be sent to your provider as well.   To learn more about what you can do with MyChart, go to [email or web address].    Your next appointment:    As scheduled -01/14/21  The format for your next appointment:   In Person  Provider:   Cherlynn Kaiser, MD  Other Instructions      Signed, Almyra Deforest, [place]  12/31/2020 10:07 PM    [place]

## 2020-12-31 ENCOUNTER — Encounter: Payer: Self-pay | Admitting: Physician Assistant

## 2020-12-31 ENCOUNTER — Encounter (HOSPITAL_COMMUNITY): Payer: 59

## 2021-01-09 ENCOUNTER — Other Ambulatory Visit: Payer: Self-pay

## 2021-01-09 DIAGNOSIS — N184 Chronic kidney disease, stage 4 (severe): Secondary | ICD-10-CM

## 2021-01-11 ENCOUNTER — Other Ambulatory Visit: Payer: Self-pay

## 2021-01-11 DIAGNOSIS — N184 Chronic kidney disease, stage 4 (severe): Secondary | ICD-10-CM

## 2021-01-13 ENCOUNTER — Other Ambulatory Visit: Payer: Self-pay

## 2021-01-13 ENCOUNTER — Other Ambulatory Visit (HOSPITAL_COMMUNITY): Payer: Self-pay | Admitting: *Deleted

## 2021-01-13 ENCOUNTER — Ambulatory Visit: Payer: 59 | Admitting: Cardiology

## 2021-01-13 ENCOUNTER — Encounter: Payer: Self-pay | Admitting: Cardiology

## 2021-01-13 VITALS — BP 134/76 | HR 75 | Ht 69.0 in | Wt 243.8 lb

## 2021-01-13 DIAGNOSIS — I4892 Unspecified atrial flutter: Secondary | ICD-10-CM | POA: Diagnosis not present

## 2021-01-13 DIAGNOSIS — R040 Epistaxis: Secondary | ICD-10-CM | POA: Diagnosis not present

## 2021-01-13 DIAGNOSIS — L989 Disorder of the skin and subcutaneous tissue, unspecified: Secondary | ICD-10-CM | POA: Diagnosis not present

## 2021-01-13 DIAGNOSIS — N185 Chronic kidney disease, stage 5: Secondary | ICD-10-CM

## 2021-01-13 NOTE — Discharge Instructions (Signed)
Sodium Ferric Gluconate Complex injection What is this medicine? SODIUM FERRIC GLUCONATE COMPLEX (SOE dee um FER ik GLOO koe nate KOM pleks) is an iron replacement. It is used with epoetin therapy to treat low iron levels in patients who are receiving hemodialysis. This medicine may be used for other purposes; ask your health care provider or pharmacist if you have questions. COMMON BRAND NAME(S): Ferrlecit, Nulecit What should I tell my health care provider before I take this medicine? They need to know if you have any of the following conditions:  anemia that is not from iron deficiency  high levels of iron in the body  an unusual or allergic reaction to iron, benzyl alcohol, other medicines, foods, dyes, or preservatives  pregnant or are trying to become pregnant  breast-feeding How should I use this medicine? This medicine is for infusion into a vein. It is given by a health care professional in a hospital or clinic setting. Talk to your pediatrician regarding the use of this medicine in children. While this drug may be prescribed for children as young as 6 years old for selected conditions, precautions do apply. Overdosage: If you think you have taken too much of this medicine contact a poison control center or emergency room at once. NOTE: This medicine is only for you. Do not share this medicine with others. What if I miss a dose? It is important not to miss your dose. Call your doctor or health care professional if you are unable to keep an appointment. What may interact with this medicine? Do not take this medicine with any of the following medications:  deferoxamine  dimercaprol  other iron products This medicine may also interact with the following medications:  chloramphenicol  deferasirox  medicine for blood pressure like enalapril This list may not describe all possible interactions. Give your health care provider a list of all the medicines, herbs,  non-prescription drugs, or dietary supplements you use. Also tell them if you smoke, drink alcohol, or use illegal drugs. Some items may interact with your medicine. What should I watch for while using this medicine? Your condition will be monitored carefully while you are receiving this medicine. Visit your doctor for check-ups as directed. What side effects may I notice from receiving this medicine? Side effects that you should report to your doctor or health care professional as soon as possible:  allergic reactions like skin rash, itching or hives, swelling of the face, lips, or tongue  breathing problems  changes in hearing  changes in vision  chills, flushing, or sweating  fast, irregular heartbeat  feeling faint or lightheaded, falls  fever, flu-like symptoms  high or low blood pressure  pain, tingling, numbness in the hands or feet  severe pain in the chest, back, flanks, or groin  swelling of the ankles, feet, hands  trouble passing urine or change in the amount of urine  unusually weak or tired Side effects that usually do not require medical attention (report to your doctor or health care professional if they continue or are bothersome):  cramps  dark colored stools  diarrhea  headache  nausea, vomiting  stomach upset This list may not describe all possible side effects. Call your doctor for medical advice about side effects. You may report side effects to FDA at 1-800-FDA-1088. Where should I keep my medicine? This drug is given in a hospital or clinic and will not be stored at home. NOTE: This sheet is a summary. It may not cover all   possible information. If you have questions about this medicine, talk to your doctor, pharmacist, or health care provider.  2021 Elsevier/Gold Standard (2008-06-26 15:58:57)   

## 2021-01-13 NOTE — Progress Notes (Signed)
Electrophysiology Office Note:    Date:  01/13/2021   ID:  Mark [name], DOB 26-Apr-1961, MRN [id number]  PCP:  Delrae Rend, MD  [facility] Cardiologist:  [name], MD  [facility] Electrophysiologist:  Vickie Epley, MD   Referring MD: Delrae Rend, MD   Chief Complaint: Atrial flutter  History of Present Illness:    Mark [name] is a 60 y.o. male who presents for an evaluation of atrial flutter at the request of Dr. Margaretann Loveless. Their medical history includes hypertension, moderate aortic stenosis, CKD 5, atrial flutter/fibrillation.  He has been off and on anticoagulation in the past for his atrial flutter.  Most recently he stopped it because of a bleeding skin lesion on his abdomen.  He is also had trouble with bleeding ulcers.  Today he tells me that he has been taking his Eliquis uninterrupted since being discharged from the hospital in February.  Unfortunately he continues to have trouble with bleeding.  He has had severe nosebleeds, GI bleeding and currently he is having trouble bleeding from a groin skin lesion.  He tells me the bleeding is significant enough where he will soak through his bed linens.  He is resorted to tying a bandage around his right thigh to try to capture the bleeding coming from this groin lesion.  He tells me his kidney function has been closely followed.  He has not yet started dialysis.  Past Medical History:  Diagnosis Date  . Anemia    labs today indicate (09/27/11)  . Arthritis    right knee  . Bleeding ulcer    hx of, required 6 units blood  . Blood transfusion    6 units with ulcer repair surgery  . Chronic kidney disease    stage IV, has fistula, never started diaylsis  . Complication of anesthesia    little slow to wake up with last surgery  . Dyspnea    with exertion  . Gout   . Hip dislocation, left ([place]) 10/09/2020  . Hypertension   . Osteoporosis   . Pneumonia     Past Surgical History:  Procedure  Laterality Date  . AV FISTULA PLACEMENT Left 09/07/2016   Procedure: ARTERIOVENOUS (AV) FISTULA CREATION LEFT ARM;  Surgeon: Angelia Mould, MD;  Location: [place];  Service: Vascular;  Laterality: Left;  . COLONOSCOPY    . EXTERNAL EAR SURGERY    . HIP CLOSED REDUCTION Left 07/27/2020   Procedure: CLOSED REDUCTION HIP;  Surgeon: Marchia Bond, MD;  Location: [place];  Service: Orthopedics;  Laterality: Left;  . HIP PINNING  11/2010  . JOINT REPLACEMENT    . OTHER SURGICAL HISTORY  ~ 08/1998   repair bleeding ulcer  . SHOULDER ARTHROSCOPY WITH ROTATOR CUFF REPAIR AND SUBACROMIAL DECOMPRESSION Right 05/26/2017   Procedure: SHOULDER ARTHROSCOPY WITH ROTATOR CUFF REPAIR AND SUBACROMIAL DECOMPRESSION;  Surgeon: Tania Ade, MD;  Location: [place];  Service: Orthopedics;  Laterality: Right;  SHOULDER ARTHROSCOPY WITH ROTATOR CUFF REPAIR AND SUBACROMIAL DECOMPRESSION  . TOTAL HIP ARTHROPLASTY  10/06/11   left  . TOTAL HIP ARTHROPLASTY  10/06/2011   Procedure: TOTAL HIP ARTHROPLASTY;  Surgeon: Kerin Salen;  Location: [place];  Service: Orthopedics;  Laterality: Left;    Current Medications: Current Meds  Medication Sig  . apixaban (ELIQUIS) 5 MG TABS tablet Take 1 tablet (5 mg total) by mouth 2 (two) times daily.  . ferrous sulfate 325 (65 FE) MG tablet Take 1 tablet (325 mg total)  by mouth daily with breakfast.  . furosemide (LASIX) 80 MG tablet Take 1 tablet (80 mg total) by mouth 2 (two) times daily.  Marland Kitchen loratadine (CLARITIN) 10 MG tablet Take 10 mg by mouth daily.  . metoprolol succinate (TOPROL-XL) 25 MG 24 hr tablet Take 12.5 mg by mouth daily.  . midodrine (PROAMATINE) 5 MG tablet Take 3 tablets (15 mg total) by mouth 3 (three) times daily with meals.  . Multiple Vitamin (MULTIVITAMIN WITH MINERALS) TABS tablet Take 1 tablet by mouth daily after breakfast.  . polyethylene glycol (MIRALAX / GLYCOLAX) 17 g packet Take 17 g by mouth 2 (two) times daily as needed for mild constipation.   . sevelamer carbonate (RENVELA) 800 MG tablet Take 1 tablet (800 mg total) by mouth 3 (three) times daily with meals.  . tamsulosin (FLOMAX) 0.4 MG CAPS capsule Take 1 capsule (0.4 mg total) by mouth daily.  . vitamin B-12 (CYANOCOBALAMIN) 1000 MCG tablet Take 1,000 mcg by mouth daily.     Allergies:   Penicillins, Atorvastatin, Penicillin g, and Rosuvastatin   Social History   Socioeconomic History  . Marital status: Single    Spouse name: Not on file  . Number of children: Not on file  . Years of education: Not on file  . Highest education level: Not on file  Occupational History  . Not on file  Tobacco Use  . Smoking status: Current Some Day Smoker    Packs/day: 0.25    Years: 32.00    Pack years: 8.00    Types: Cigarettes  . Smokeless tobacco: Former Systems developer    Types: Chew    Quit date: 7  . Tobacco comment: smoking cessation consult entered  Vaping Use  . Vaping Use: Never used  Substance and Sexual Activity  . Alcohol use: Yes    Alcohol/week: 5.0 standard drinks    Types: 5 Cans of beer per week    Comment: occasional  . Drug use: No  . Sexual activity: Yes  Other Topics Concern  . Not on file  Social History Narrative  . Not on file   Social Determinants of Health   Financial Resource Strain: Not on file  Food Insecurity: Not on file  Transportation Needs: Not on file  Physical Activity: Not on file  Stress: Not on file  Social Connections: Not on file     Family History: The patient's family history includes Atrial fibrillation in his mother; Cancer in his father and mother; Hypertension in his mother.  ROS:   Please see the history of present illness.    All other systems reviewed and are negative.  EKGs/Labs/Other Studies Reviewed:    The following studies were reviewed today:  October 09, 2020 echo personally reviewed Left ventricular function normal, 60% Mild LVH RV function normal Severely dilated left atrium Severe MAC Moderate left  ear  December 30, 2020 EKG shows atrial flutter with a ventricular rate of 107 bpm  Recent Labs: 07/28/2020: ALT 17 10/08/2020: TSH 1.804 10/16/2020: Magnesium 1.8 12/14/2020: B Natriuretic Peptide 1,930.2 12/19/2020: Hemoglobin 9.2; Platelets 160 12/23/2020: BUN 93; Creatinine, Ser 5.06; Potassium 3.8; Sodium 133  Recent Lipid Panel No results found for: CHOL, TRIG, HDL, CHOLHDL, VLDL, LDLCALC, LDLDIRECT  Physical Exam:    VS:  BP 134/76   Pulse 75   Ht _0  (1.753 m)   Wt 243 lb 12.8 oz (110.6 kg)   SpO2 (!) 89%   BMI 36.00 kg/m     Wt Readings from  Last 3 Encounters:  01/13/21 243 lb 12.8 oz (110.6 kg)  12/30/20 248 lb 9.6 oz (112.8 kg)  12/22/20 240 lb 11.9 oz (109.2 kg)     GEN:  Well nourished, well developed in no acute distress HEENT: Normal NECK: No JVD; No carotid bruits LYMPHATICS: No lymphadenopathy CARDIAC: Irregularly irregular, no murmurs, rubs, gallops RESPIRATORY:  Clear to auscultation without rales, wheezing or rhonchi  ABDOMEN: Soft, non-tender, non-distended.  There are scattered skin lesions across his abdomen.  There is a 1 cm in diameter ulcerated skin lesion in the right inguinal fold that has some bloody drainage.  He had it covered with a bandage wrapped around his right thigh that also had a significant amount of bloody discharge absorbed. MUSCULOSKELETAL:  No edema; No deformity  SKIN: Warm and dry NEUROLOGIC:  Alert and oriented x 3 PSYCHIATRIC:  Normal affect   ASSESSMENT:    1. Atrial flutter, unspecified type ([place])   2. Chronic kidney disease (CKD), stage V ([place])   3. Skin lesion   4. Epistaxis    PLAN:    In order of problems listed above:  1. Atrial fibrillation and flutter Currently rate controlled.  On Eliquis for stroke prophylaxis although in the past has been interrupted because of trouble with bleeding.  His CHA2DS2-VASc is at least 3 for diabetes, hypertension, aortic atherosclerosis.  He has been on Eliquis uninterrupted since  hospitalization in February 2022.  Currently he has a 1 cm in diameter lesion in his groin that is continuously oozing blood.  I am concerned that his various skin lesions are related to his chronic kidney disease.  I would like him to see his primary care physician to have these assessed.  I would like him to have these skin lesions assessed prior to making any decision on implanting a watchman device.  I do think he would benefit from a left atrial appendage occlusion device given his trouble with bleeding while on anticoagulation.  If the skin lesions heal, would consider proceeding with implant.  I would like to see him back in clinic before making this decision.  If we proceed in the future, would perform a transesophageal echo for preprocedural imaging and avoid contrast during the procedure.  2.  CKD 5 Not currently on dialysis.  AV fistula in the left arm.  3.  Bleeding skin ulcers/lesions I would like him to follow-up with his primary care in the next few weeks to have the groin skin lesion assessed.  I am concerned that this may require a small procedure to remove the lesion.  We will plan to follow-up in 3 months to reassess his skin lesions and to make a determination about proceeding with watchman implant.  Medication Adjustments/Labs and Tests Ordered: Current medicines are reviewed at length with the patient today.  Concerns regarding medicines are outlined above.  No orders of the defined types were placed in this encounter.  No orders of the defined types were placed in this encounter.    Signed, Lars Mage, MD, [facility]  01/13/2021 12:58 PM    Electrophysiology  [facility]

## 2021-01-13 NOTE — Patient Instructions (Addendum)
Medication Instructions:  Your physician recommends that you continue on your current medications as directed. Please refer to the Current Medication list given to you today.  Labwork: None ordered.  Testing/Procedures: None ordered.  Follow-Up: Your physician wants you to follow-up in: 3 months with Dr. Quentin Ore.     Any Other Special Instructions Will Be Listed Below (If Applicable).  If you need a refill on your cardiac medications before your next appointment, please call your pharmacy.

## 2021-01-14 ENCOUNTER — Encounter: Payer: Self-pay | Admitting: Internal Medicine

## 2021-01-14 ENCOUNTER — Encounter (HOSPITAL_COMMUNITY)
Admission: RE | Admit: 2021-01-14 | Discharge: 2021-01-14 | Disposition: A | Discharge status: Home / Self Care | Payer: 59 | Source: Ambulatory Visit | Attending: Nephrology | Admitting: Nephrology

## 2021-01-14 ENCOUNTER — Ambulatory Visit (INDEPENDENT_AMBULATORY_CARE_PROVIDER_SITE_OTHER): Payer: 59 | Admitting: Internal Medicine

## 2021-01-14 VITALS — BP 116/74 | HR 59 | Temp 96.5°F | Resp 18

## 2021-01-14 VITALS — BP 106/71 | HR 84 | Ht 69.0 in | Wt 246.4 lb

## 2021-01-14 DIAGNOSIS — N185 Chronic kidney disease, stage 5: Secondary | ICD-10-CM | POA: Diagnosis not present

## 2021-01-14 DIAGNOSIS — I4892 Unspecified atrial flutter: Secondary | ICD-10-CM | POA: Diagnosis not present

## 2021-01-14 DIAGNOSIS — I10 Essential (primary) hypertension: Secondary | ICD-10-CM

## 2021-01-14 DIAGNOSIS — I350 Nonrheumatic aortic (valve) stenosis: Secondary | ICD-10-CM | POA: Diagnosis not present

## 2021-01-14 DIAGNOSIS — N184 Chronic kidney disease, stage 4 (severe): Secondary | ICD-10-CM

## 2021-01-14 DIAGNOSIS — Z01812 Encounter for preprocedural laboratory examination: Secondary | ICD-10-CM | POA: Diagnosis not present

## 2021-01-14 DIAGNOSIS — I4819 Other persistent atrial fibrillation: Secondary | ICD-10-CM

## 2021-01-14 LAB — POCT HEMOGLOBIN-HEMACUE: Hemoglobin: 7.9 g/dL — ABNORMAL LOW (ref 13.0–17.0)

## 2021-01-14 MED ORDER — EPOETIN ALFA-EPBX 10000 UNIT/ML IJ SOLN
INTRAMUSCULAR | Status: AC
  Administered 2021-01-14: 20000 [IU] via SUBCUTANEOUS
  Filled 2021-01-14: qty 2

## 2021-01-14 MED ORDER — EPOETIN ALFA-EPBX 10000 UNIT/ML IJ SOLN: 20000.0000 [IU] | INTRAMUSCULAR | Status: DC

## 2021-01-14 MED ORDER — NA FERRIC GLUC CPLX IN SUCROSE 12.5 MG/ML IV SOLN
250.0000 mg | INTRAVENOUS | Status: DC
  Administered 2021-01-14: 250 mg via INTRAVENOUS
  Filled 2021-01-14: qty 20

## 2021-01-14 NOTE — Progress Notes (Signed)
Cardiology Office Note:    Date:  01/14/2021   ID:  KAHLER WEHBE, DOB [birth year], MRN [id number]  PCP:  Delrae Rend, MD  Cardiologist:  [name], MD  Electrophysiologist:  Vickie Epley, MD   Referring MD: Delrae Rend, MD   Chief Complaint/Reason for Referral: Atrial flutter  History of Present Illness:    Mark [name] is a 60 y.o. male with a history of moderate AS, CKD V, HTN, gout, Persistent a flutter, DM-2,  and hx of hip dislocations and admitted 12/14/20 with acute on chronic diastolic CHF, acute respiratory failure who is being seen today for the evaluation of atrial flutter.  He has been doing really well since discharge. No missed doses of Eliquis. He does have a small wound oozing in his groin but he feels this is stable. He has been trying to ambulate, he has been keeping a low salt diet, and has been feeling well. We discussed cardioversion in detail today including trial of restoring sinus rhythm. He continues to have class II dyspnea and this may be secondary to atrial flutter, in part. Participated in shared decision making and agreed on attempting sinus rhythm, since he was likely in sinus prior to his most recent hip dislocation.   The patient denies chest pain, chest pressure, palpitations, PND, orthopnea, or leg swelling. Denies cough, fever, chills. Denies nausea, vomiting. Denies syncope or presyncope. Denies dizziness or lightheadedness.   Past Medical History:  Diagnosis Date  . Anemia    labs today indicate (09/27/11)  . Arthritis    right knee  . Bleeding ulcer    hx of, required 6 units blood  . Blood transfusion    6 units with ulcer repair surgery  . Chronic kidney disease    stage IV, has fistula, never started diaylsis  . Complication of anesthesia    little slow to wake up with last surgery  . Dyspnea    with exertion  . Gout   . Hip dislocation, left ([place]) 10/09/2020  . Hypertension   . Osteoporosis   . Pneumonia      Past Surgical History:  Procedure Laterality Date  . AV FISTULA PLACEMENT Left 09/07/2016   Procedure: ARTERIOVENOUS (AV) FISTULA CREATION LEFT ARM;  Surgeon: Angelia Mould, MD;  Location: [place];  Service: Vascular;  Laterality: Left;  . COLONOSCOPY    . EXTERNAL EAR SURGERY    . HIP CLOSED REDUCTION Left 07/27/2020   Procedure: CLOSED REDUCTION HIP;  Surgeon: Marchia Bond, MD;  Location: [place];  Service: Orthopedics;  Laterality: Left;  . HIP PINNING  11/2010  . JOINT REPLACEMENT    . OTHER SURGICAL HISTORY  ~ 08/1998   repair bleeding ulcer  . SHOULDER ARTHROSCOPY WITH ROTATOR CUFF REPAIR AND SUBACROMIAL DECOMPRESSION Right 05/26/2017   Procedure: SHOULDER ARTHROSCOPY WITH ROTATOR CUFF REPAIR AND SUBACROMIAL DECOMPRESSION;  Surgeon: Tania Ade, MD;  Location: [place];  Service: Orthopedics;  Laterality: Right;  SHOULDER ARTHROSCOPY WITH ROTATOR CUFF REPAIR AND SUBACROMIAL DECOMPRESSION  . TOTAL HIP ARTHROPLASTY  10/06/11   left  . TOTAL HIP ARTHROPLASTY  10/06/2011   Procedure: TOTAL HIP ARTHROPLASTY;  Surgeon: Kerin Salen;  Location: [place];  Service: Orthopedics;  Laterality: Left;    Current Medications: Current Meds  Medication Sig  . apixaban (ELIQUIS) 5 MG TABS tablet Take 1 tablet (5 mg total) by mouth 2 (two) times daily.  . ferrous sulfate 325 (65 FE) MG tablet Take 1 tablet (325 mg  total) by mouth daily with breakfast.  . furosemide (LASIX) 80 MG tablet Take 1 tablet (80 mg total) by mouth 2 (two) times daily.  Marland Kitchen loratadine (CLARITIN) 10 MG tablet Take 10 mg by mouth daily.  . metoprolol succinate (TOPROL-XL) 25 MG 24 hr tablet Take 12.5 mg by mouth daily.  . midodrine (PROAMATINE) 5 MG tablet Take 3 tablets (15 mg total) by mouth 3 (three) times daily with meals.  . Multiple Vitamin (MULTIVITAMIN WITH MINERALS) TABS tablet Take 1 tablet by mouth daily after breakfast.  . polyethylene glycol (MIRALAX / GLYCOLAX) 17 g packet Take 17 g by mouth 2 (two) times  daily as needed for mild constipation.  . sevelamer carbonate (RENVELA) 800 MG tablet Take 1 tablet (800 mg total) by mouth 3 (three) times daily with meals.  . tamsulosin (FLOMAX) 0.4 MG CAPS capsule Take 1 capsule (0.4 mg total) by mouth daily.  . vitamin B-12 (CYANOCOBALAMIN) 1000 MCG tablet Take 1,000 mcg by mouth daily.     Allergies:   Penicillins, Atorvastatin, Penicillin g, and Rosuvastatin   Social History   Tobacco Use  . Smoking status: Current Some Day Smoker    Packs/day: 0.25    Years: 32.00    Pack years: 8.00    Types: Cigarettes  . Smokeless tobacco: Former Systems developer    Types: Chew    Quit date: 25  . Tobacco comment: smoking cessation consult entered  Vaping Use  . Vaping Use: Never used  Substance Use Topics  . Alcohol use: Yes    Alcohol/week: 5.0 standard drinks    Types: 5 Cans of beer per week    Comment: occasional  . Drug use: No     Family History: The patient's family history includes Atrial fibrillation in his mother; Cancer in his father and mother; Hypertension in his mother.  ROS:   Please see the history of present illness.    All other systems reviewed and are negative.  EKGs/Labs/Other Studies Reviewed:    The following studies were reviewed today:  EKG:  Atrial flutter variable rate  Recent Labs: 07/28/2020: ALT 17 10/08/2020: TSH 1.804 10/16/2020: Magnesium 1.8 12/14/2020: B Natriuretic Peptide 1,930.2 12/19/2020: Platelets 160 12/23/2020: BUN 93; Creatinine, Ser 5.06; Potassium 3.8; Sodium 133 01/14/2021: Hemoglobin 7.9  Recent Lipid Panel No results found for: CHOL, TRIG, HDL, CHOLHDL, VLDL, LDLCALC, LDLDIRECT  Physical Exam:    VS:  BP 106/71   Pulse 84   Ht '5\' 9"'$  (1.753 m)   Wt 246 lb 6.4 oz (111.8 kg)   BMI 36.39 kg/m     Wt Readings from Last 5 Encounters:  01/14/21 246 lb 6.4 oz (111.8 kg)  01/13/21 243 lb 12.8 oz (110.6 kg)  12/30/20 248 lb 9.6 oz (112.8 kg)  12/22/20 240 lb 11.9 oz (109.2 kg)  12/05/20 254 lb 12.8 oz  (115.6 kg)    Constitutional: No acute distress Eyes: sclera non-icteric, normal conjunctiva and lids ENMT: normal dentition, moist mucous membranes Cardiovascular: irregular rhythm, normal rate, 3/6 SEM. S1 and S2 normal. Radial pulses normal bilaterally. No jugular venous distention.  Respiratory: clear to auscultation bilaterally GI : normal bowel sounds, soft and nontender. No distention.   MSK: mild LE edema NEURO: grossly nonfocal exam, moves all extremities. PSYCH: alert and oriented x 3, normal mood and affect.   ASSESSMENT:    1. Atrial flutter, unspecified type ([place])   2. Pre-procedure lab exam   3. Chronic kidney disease (CKD), stage V ([place])   4.  Aortic valve stenosis, etiology of cardiac valve disease unspecified   5. Essential hypertension    PLAN:    Atrial flutter, unspecified type ([place]) - Plan: EKG 12-Lead  - will attempt cardioversion. He has been on [facility] since 12/23/20 without interruption.   Risks, benefits and alternatives of direct current cardioversion reviewed including potential for post-cardioversion rhythms, especially life-threatening arrhythmias (ventricular tachycardia and fibrillation, profound bradycardia). Major complications may include serious or fatal arrhythmias, myocardial damage, and acute pulmonary edema; minor complications include skin burns and transient hypotension. Benefits include restoration of sinus rhythm. Alternatives to treatment were discussed, questions were answered. Patient is willing to proceed.    Chronic kidney disease (CKD), stage V ([place]) - not on dialysis yet but does have a mature graft in place.   Aortic valve stenosis, etiology of cardiac valve disease unspecified - mean gradient 29 mmHg. Moderate AS. Will reassess in 6-12 mo. With CKD, AS will likely continue to progress.  Essential hypertension - hypotensive, no change to therapy.   Total time of encounter: 30 minutes total time of encounter, including 20 minutes  spent in face-to-face patient care on the date of this encounter. This time includes coordination of care and counseling regarding above mentioned problem list. Remainder of non-face-to-face time involved reviewing chart documents/testing relevant to the patient encounter and documentation in the medical record. I have independently reviewed documentation from referring provider.   Cherlynn Kaiser, MD, [facility]    Medication Adjustments/Labs and Tests Ordered: Current medicines are reviewed at length with the patient today.  Concerns regarding medicines are outlined above.   Orders Placed This Encounter  Procedures  . Basic metabolic panel  . CBC  . EKG 12-Lead    Shared Decision Making/Informed Consent:   Shared Decision Making/Informed Consent The risks (stroke, cardiac arrhythmias rarely resulting in the need for a temporary or permanent pacemaker, skin irritation or burns and complications associated with conscious sedation including aspiration, arrhythmia, respiratory failure and death), benefits (restoration of normal sinus rhythm) and alternatives of a direct current cardioversion were explained in detail to Mr. Hajny and he agrees to proceed.         No orders of the defined types were placed in this encounter.   Patient Instructions  Medication Instructions:  No Changes In Medications at this time.  *If you need a refill on your cardiac medications before your next appointment, please call your pharmacy*  Lab Work: [place] ON Friday 01/16/21 AT THE OFFICE  YOU WILL NEED A COVID TEST PRIOR TO CARDIOVERSION. YOU ARE SCHEDULED FOR Monday 01/19/21 at 12:15pm. THIS WILL TAKE PLACE AT This is a Drive Up Visit at [id number] [place], [place] [address] Someone will direct you to the appropriate testing line. Stay in your car and someone will be with you shortly.  If you have labs (blood work) drawn today and your tests  are completely normal, you will receive your results only by: Marland Kitchen MyChart Message (if you have MyChart) OR . A paper copy in the mail If you have any lab test that is abnormal or we need to change your treatment, we will call you to review the results.  Testing/Procedures: Your physician has requested that you have a Cardioversion.  Electrical Cardioversion uses a jolt of electricity to your heart either through paddles or wired patches attached to your chest. This is a controlled, usually prescheduled, procedure. This procedure is done at the  hospital and you are not awake during the procedure. You usually go home the day of the procedure. Please see the instruction sheet given to you today for more information.  Dear Mr. Mccleaf  You are scheduled for a Cardioversion on Wednesday 01/20/2021 with Dr. Johney Frame.  Please arrive at the [facility] (Main Entrance A) at [facility]: [place], [place] [address] at 11 am. (1 hour prior to procedure unless lab work is needed; if lab work is needed arrive 1.5 hours ahead)   DIET: Nothing to eat or drink after midnight except a sip of water with medications (see medication instructions below)  Medication Instructions:  Continue your anticoagulant: ELIQUIS  You will need to continue your anticoagulant after your procedure until you  are told by your  Provider that it is safe to stop  Labs: Come to: [place] (Dr. Delphina Cahill OFFICE) [place]. YOU MAY COME ANYTIME BETWEEN 8am and 4pm. PLEASE HAVE THIS DONE ON 01/16/21 (Friday)  You must have a responsible person to drive you home and stay in the waiting area during your procedure. Failure to do so could result in cancellation.  Bring your insurance cards.  *Special Note: Every effort is made to have your procedure done on time. Occasionally there are emergencies that occur at the hospital that may cause delays. Please be patient if a delay does occur.    Follow-Up: At [facility], you and your health needs are our priority.  As part of our continuing mission to provide you with exceptional heart care, we have created designated Provider Care Teams.  These Care Teams include your primary Cardiologist (physician) and Advanced Practice Providers (APPs -  Physician Assistants and Nurse Practitioners) who all work together to provide you with the care you need, when you need it.  Your next appointment:   2 week(s) POST CARDIOVERSION   The format for your next appointment:   In Person  Provider:   Cherlynn Kaiser, MD

## 2021-01-14 NOTE — Patient Instructions (Addendum)
Medication Instructions:  No Changes In Medications at this time.  *If you need a refill on your cardiac medications before your next appointment, please call your pharmacy*  Lab Work: [place] ON Friday 01/16/21 AT THE OFFICE  YOU WILL NEED A COVID TEST PRIOR TO CARDIOVERSION. YOU ARE SCHEDULED FOR Monday 01/19/21 at 12:15pm. THIS WILL TAKE PLACE AT This is a Drive Up Visit at [id number] [place], [place] [address] Someone will direct you to the appropriate testing line. Stay in your car and someone will be with you shortly.  If you have labs (blood work) drawn today and your tests are completely normal, you will receive your results only by: Marland Kitchen MyChart Message (if you have MyChart) OR . A paper copy in the mail If you have any lab test that is abnormal or we need to change your treatment, we will call you to review the results.  Testing/Procedures: Your physician has requested that you have a Cardioversion.  Electrical Cardioversion uses a jolt of electricity to your heart either through paddles or wired patches attached to your chest. This is a controlled, usually prescheduled, procedure. This procedure is done at the hospital and you are not awake during the procedure. You usually go home the day of the procedure. Please see the instruction sheet given to you today for more information.  Dear Mr. Hagedorn  You are scheduled for a Cardioversion on Wednesday 02/05/2021 with Dr. Johney Frame.  Please arrive at the [facility] (Main Entrance A) at [facility]: [place], [place] [address] at 11 am. (1 hour prior to procedure unless lab work is needed; if lab work is needed arrive 1.5 hours ahead)   DIET: Nothing to eat or drink after midnight except a sip of water with medications (see medication instructions below)  Medication Instructions:  Continue your anticoagulant: ELIQUIS  You will need to continue your anticoagulant after your procedure  until you  are told by your  Provider that it is safe to stop  Labs: Come to: [place] (Dr. Delphina Cahill OFFICE) [place]. YOU MAY COME ANYTIME BETWEEN 8am and 4pm. PLEASE HAVE THIS DONE ON 01/16/21 (Friday)  You must have a responsible person to drive you home and stay in the waiting area during your procedure. Failure to do so could result in cancellation.  Bring your insurance cards.  *Special Note: Every effort is made to have your procedure done on time. Occasionally there are emergencies that occur at the hospital that may cause delays. Please be patient if a delay does occur.   Follow-Up: At [facility], you and your health needs are our priority.  As part of our continuing mission to provide you with exceptional heart care, we have created designated Provider Care Teams.  These Care Teams include your primary Cardiologist (physician) and Advanced Practice Providers (APPs -  Physician Assistants and Nurse Practitioners) who all work together to provide you with the care you need, when you need it.  Your next appointment:   2 week(s) POST CARDIOVERSION   The format for your next appointment:   In Person  Provider:   Cherlynn Kaiser, MD

## 2021-01-14 NOTE — H&P (View-Only) (Signed)
Cardiology Office Note:    Date:  01/14/2021   ID:  Mark [name], DOB 1961/11/07, MRN [id number]  PCP:  Delrae Rend, MD  Cardiologist:  [name], MD  Electrophysiologist:  Vickie Epley, MD   Referring MD: Delrae Rend, MD   Chief Complaint/Reason for Referral: Atrial flutter  History of Present Illness:    Mark [name] is a 60 y.o. male with a history of moderate AS, CKD V, HTN, gout, Persistent a flutter, DM-2,  and hx of hip dislocations and admitted 12/14/20 with acute on chronic diastolic CHF, acute respiratory failure who is being seen today for the evaluation of atrial flutter.  He has been doing really well since discharge. No missed doses of Eliquis. He does have a small wound oozing in his groin but he feels this is stable. He has been trying to ambulate, he has been keeping a low salt diet, and has been feeling well. We discussed cardioversion in detail today including trial of restoring sinus rhythm. He continues to have class II dyspnea and this may be secondary to atrial flutter, in part. Participated in shared decision making and agreed on attempting sinus rhythm, since he was likely in sinus prior to his most recent hip dislocation.   The patient denies chest pain, chest pressure, palpitations, PND, orthopnea, or leg swelling. Denies cough, fever, chills. Denies nausea, vomiting. Denies syncope or presyncope. Denies dizziness or lightheadedness.   Past Medical History:  Diagnosis Date  . Anemia    labs today indicate (09/27/11)  . Arthritis    right knee  . Bleeding ulcer    hx of, required 6 units blood  . Blood transfusion    6 units with ulcer repair surgery  . Chronic kidney disease    stage IV, has fistula, never started diaylsis  . Complication of anesthesia    little slow to wake up with last surgery  . Dyspnea    with exertion  . Gout   . Hip dislocation, left ([place]) 10/09/2020  . Hypertension   . Osteoporosis   . Pneumonia      Past Surgical History:  Procedure Laterality Date  . AV FISTULA PLACEMENT Left 09/07/2016   Procedure: ARTERIOVENOUS (AV) FISTULA CREATION LEFT ARM;  Surgeon: Angelia Mould, MD;  Location: [facility];  Service: Vascular;  Laterality: Left;  . COLONOSCOPY    . EXTERNAL EAR SURGERY    . HIP CLOSED REDUCTION Left 07/27/2020   Procedure: CLOSED REDUCTION HIP;  Surgeon: Marchia Bond, MD;  Location: [facility];  Service: Orthopedics;  Laterality: Left;  . HIP PINNING  11/2010  . JOINT REPLACEMENT    . OTHER SURGICAL HISTORY  ~ 08/1998   repair bleeding ulcer  . SHOULDER ARTHROSCOPY WITH ROTATOR CUFF REPAIR AND SUBACROMIAL DECOMPRESSION Right 05/26/2017   Procedure: SHOULDER ARTHROSCOPY WITH ROTATOR CUFF REPAIR AND SUBACROMIAL DECOMPRESSION;  Surgeon: Tania Ade, MD;  Location: [facility];  Service: Orthopedics;  Laterality: Right;  SHOULDER ARTHROSCOPY WITH ROTATOR CUFF REPAIR AND SUBACROMIAL DECOMPRESSION  . TOTAL HIP ARTHROPLASTY  10/06/11   left  . TOTAL HIP ARTHROPLASTY  10/06/2011   Procedure: TOTAL HIP ARTHROPLASTY;  Surgeon: Kerin Salen;  Location: [facility];  Service: Orthopedics;  Laterality: Left;    Current Medications: Current Meds  Medication Sig  . apixaban (ELIQUIS) 5 MG TABS tablet Take 1 tablet (5 mg total) by mouth 2 (two) times daily.  . ferrous sulfate 325 (65 FE) MG tablet Take 1 tablet (325 mg  total) by mouth daily with breakfast.  . furosemide (LASIX) 80 MG tablet Take 1 tablet (80 mg total) by mouth 2 (two) times daily.  Marland Kitchen loratadine (CLARITIN) 10 MG tablet Take 10 mg by mouth daily.  . metoprolol succinate (TOPROL-XL) 25 MG 24 hr tablet Take 12.5 mg by mouth daily.  . midodrine (PROAMATINE) 5 MG tablet Take 3 tablets (15 mg total) by mouth 3 (three) times daily with meals.  . Multiple Vitamin (MULTIVITAMIN WITH MINERALS) TABS tablet Take 1 tablet by mouth daily after breakfast.  . polyethylene glycol (MIRALAX / GLYCOLAX) 17 g packet Take 17 g by mouth 2 (two) times  daily as needed for mild constipation.  . sevelamer carbonate (RENVELA) 800 MG tablet Take 1 tablet (800 mg total) by mouth 3 (three) times daily with meals.  . tamsulosin (FLOMAX) 0.4 MG CAPS capsule Take 1 capsule (0.4 mg total) by mouth daily.  . vitamin B-12 (CYANOCOBALAMIN) 1000 MCG tablet Take 1,000 mcg by mouth daily.     Allergies:   Penicillins, Atorvastatin, Penicillin g, and Rosuvastatin   Social History   Tobacco Use  . Smoking status: Current Some Day Smoker    Packs/day: 0.25    Years: 32.00    Pack years: 8.00    Types: Cigarettes  . Smokeless tobacco: Former Systems developer    Types: Chew    Quit date: 51  . Tobacco comment: smoking cessation consult entered  Vaping Use  . Vaping Use: Never used  Substance Use Topics  . Alcohol use: Yes    Alcohol/week: 5.0 standard drinks    Types: 5 Cans of beer per week    Comment: occasional  . Drug use: No     Family History: The patient's family history includes Atrial fibrillation in his mother; Cancer in his father and mother; Hypertension in his mother.  ROS:   Please see the history of present illness.    All other systems reviewed and are negative.  EKGs/Labs/Other Studies Reviewed:    The following studies were reviewed today:  EKG:  Atrial flutter variable rate  Recent Labs: 07/28/2020: ALT 17 10/08/2020: TSH 1.804 10/16/2020: Magnesium 1.8 12/14/2020: B Natriuretic Peptide 1,930.2 12/19/2020: Platelets 160 12/23/2020: BUN 93; Creatinine, Ser 5.06; Potassium 3.8; Sodium 133 01/14/2021: Hemoglobin 7.9  Recent Lipid Panel No results found for: CHOL, TRIG, HDL, CHOLHDL, VLDL, LDLCALC, LDLDIRECT  Physical Exam:    VS:  BP 106/71   Pulse 84   Ht '5\' 9"'$  (1.753 m)   Wt 246 lb 6.4 oz (111.8 kg)   BMI 36.39 kg/m     Wt Readings from Last 5 Encounters:  01/14/21 246 lb 6.4 oz (111.8 kg)  01/13/21 243 lb 12.8 oz (110.6 kg)  12/30/20 248 lb 9.6 oz (112.8 kg)  12/22/20 240 lb 11.9 oz (109.2 kg)  12/05/20 254 lb 12.8 oz  (115.6 kg)    Constitutional: No acute distress Eyes: sclera non-icteric, normal conjunctiva and lids ENMT: normal dentition, moist mucous membranes Cardiovascular: irregular rhythm, normal rate, 3/6 SEM. S1 and S2 normal. Radial pulses normal bilaterally. No jugular venous distention.  Respiratory: clear to auscultation bilaterally GI : normal bowel sounds, soft and nontender. No distention.   MSK: mild LE edema NEURO: grossly nonfocal exam, moves all extremities. PSYCH: alert and oriented x 3, normal mood and affect.   ASSESSMENT:    1. Atrial flutter, unspecified type ([place])   2. Pre-procedure lab exam   3. Chronic kidney disease (CKD), stage V ([place])   4.  Aortic valve stenosis, etiology of cardiac valve disease unspecified   5. Essential hypertension    PLAN:    Atrial flutter, unspecified type ([place]) - Plan: EKG 12-Lead  - will attempt cardioversion. He has been on [facility] since 12/23/20 without interruption.   Risks, benefits and alternatives of direct current cardioversion reviewed including potential for post-cardioversion rhythms, especially life-threatening arrhythmias (ventricular tachycardia and fibrillation, profound bradycardia). Major complications may include serious or fatal arrhythmias, myocardial damage, and acute pulmonary edema; minor complications include skin burns and transient hypotension. Benefits include restoration of sinus rhythm. Alternatives to treatment were discussed, questions were answered. Patient is willing to proceed.    Chronic kidney disease (CKD), stage V ([place]) - not on dialysis yet but does have a mature graft in place.   Aortic valve stenosis, etiology of cardiac valve disease unspecified - mean gradient 29 mmHg. Moderate AS. Will reassess in 6-12 mo. With CKD, AS will likely continue to progress.  Essential hypertension - hypotensive, no change to therapy.   Total time of encounter: 30 minutes total time of encounter, including 20 minutes  spent in face-to-face patient care on the date of this encounter. This time includes coordination of care and counseling regarding above mentioned problem list. Remainder of non-face-to-face time involved reviewing chart documents/testing relevant to the patient encounter and documentation in the medical record. I have independently reviewed documentation from referring provider.   Cherlynn Kaiser, MD, [facility]    Medication Adjustments/Labs and Tests Ordered: Current medicines are reviewed at length with the patient today.  Concerns regarding medicines are outlined above.   Orders Placed This Encounter  Procedures  . Basic metabolic panel  . CBC  . EKG 12-Lead    Shared Decision Making/Informed Consent:   Shared Decision Making/Informed Consent The risks (stroke, cardiac arrhythmias rarely resulting in the need for a temporary or permanent pacemaker, skin irritation or burns and complications associated with conscious sedation including aspiration, arrhythmia, respiratory failure and death), benefits (restoration of normal sinus rhythm) and alternatives of a direct current cardioversion were explained in detail to Mr. Strehle and he agrees to proceed.         No orders of the defined types were placed in this encounter.   Patient Instructions  Medication Instructions:  No Changes In Medications at this time.  *If you need a refill on your cardiac medications before your next appointment, please call your pharmacy*  Lab Work: [place] ON Friday 01/16/21 AT THE OFFICE  YOU WILL NEED A COVID TEST PRIOR TO CARDIOVERSION. YOU ARE SCHEDULED FOR Monday 01/19/21 at 12:15pm. THIS WILL TAKE PLACE AT This is a Drive Up Visit at [id number] [place], [place] [address] Someone will direct you to the appropriate testing line. Stay in your car and someone will be with you shortly.  If you have labs (blood work) drawn today and your tests  are completely normal, you will receive your results only by: Marland Kitchen MyChart Message (if you have MyChart) OR . A paper copy in the mail If you have any lab test that is abnormal or we need to change your treatment, we will call you to review the results.  Testing/Procedures: Your physician has requested that you have a Cardioversion.  Electrical Cardioversion uses a jolt of electricity to your heart either through paddles or wired patches attached to your chest. This is a controlled, usually prescheduled, procedure. This procedure is done at the  hospital and you are not awake during the procedure. You usually go home the day of the procedure. Please see the instruction sheet given to you today for more information.  Dear Mr. Reager  You are scheduled for a Cardioversion on Wednesday 01/22/2021 with Dr. Johney Frame.  Please arrive at the [facility] (Main Entrance A) at [facility]: [place], [place] [address] at 11 am. (1 hour prior to procedure unless lab work is needed; if lab work is needed arrive 1.5 hours ahead)   DIET: Nothing to eat or drink after midnight except a sip of water with medications (see medication instructions below)  Medication Instructions:  Continue your anticoagulant: ELIQUIS  You will need to continue your anticoagulant after your procedure until you  are told by your  Provider that it is safe to stop  Labs: Come to: [place] (Dr. Delphina Cahill OFFICE) [place]. YOU MAY COME ANYTIME BETWEEN 8am and 4pm. PLEASE HAVE THIS DONE ON 01/16/21 (Friday)  You must have a responsible person to drive you home and stay in the waiting area during your procedure. Failure to do so could result in cancellation.  Bring your insurance cards.  *Special Note: Every effort is made to have your procedure done on time. Occasionally there are emergencies that occur at the hospital that may cause delays. Please be patient if a delay does occur.    Follow-Up: At [facility], you and your health needs are our priority.  As part of our continuing mission to provide you with exceptional heart care, we have created designated Provider Care Teams.  These Care Teams include your primary Cardiologist (physician) and Advanced Practice Providers (APPs -  Physician Assistants and Nurse Practitioners) who all work together to provide you with the care you need, when you need it.  Your next appointment:   2 week(s) POST CARDIOVERSION   The format for your next appointment:   In Person  Provider:   Cherlynn Kaiser, MD

## 2021-01-17 LAB — CBC
Hematocrit: 25.7 % — ABNORMAL LOW (ref 37.5–51.0)
Hemoglobin: 8.4 g/dL — ABNORMAL LOW (ref 13.0–17.7)
MCH: 31.8 pg (ref 26.6–33.0)
MCHC: 32.7 g/dL (ref 31.5–35.7)
MCV: 97 fL (ref 79–97)
Platelets: 238 10*3/uL (ref 150–450)
RBC: 2.64 x10E6/uL — CL (ref 4.14–5.80)
RDW: 14.5 % (ref 11.6–15.4)
WBC: 10.4 10*3/uL (ref 3.4–10.8)

## 2021-01-17 LAB — BASIC METABOLIC PANEL
BUN/Creatinine Ratio: 18 (ref 10–24)
BUN: 105 mg/dL (ref 8–27)
CO2: 17 mmol/L — ABNORMAL LOW (ref 20–29)
Calcium: 11.2 mg/dL — ABNORMAL HIGH (ref 8.6–10.2)
Chloride: 101 mmol/L (ref 96–106)
Creatinine, Ser: 5.73 mg/dL — ABNORMAL HIGH (ref 0.76–1.27)
Glucose: 134 mg/dL — ABNORMAL HIGH (ref 65–99)
Potassium: 4.4 mmol/L (ref 3.5–5.2)
Sodium: 139 mmol/L (ref 134–144)
eGFR: 11 mL/min/{1.73_m2} — ABNORMAL LOW (ref >59)

## 2021-01-19 ENCOUNTER — Other Ambulatory Visit (HOSPITAL_COMMUNITY)
Admission: RE | Admit: 2021-01-19 | Discharge: 2021-01-19 | Disposition: A | Discharge status: Home / Self Care | Payer: 59 | Source: Ambulatory Visit | Attending: Cardiology | Admitting: Cardiology

## 2021-01-19 DIAGNOSIS — Z01812 Encounter for preprocedural laboratory examination: Secondary | ICD-10-CM | POA: Insufficient documentation

## 2021-01-19 DIAGNOSIS — Z20822 Contact with and (suspected) exposure to covid-19: Secondary | ICD-10-CM | POA: Insufficient documentation

## 2021-01-19 LAB — SARS CORONAVIRUS 2 (TAT 6-24 HRS): SARS Coronavirus 2: NEGATIVE

## 2021-01-21 ENCOUNTER — Encounter (HOSPITAL_COMMUNITY): Admission: AD | Disposition: E | Payer: Self-pay | Source: Home / Self Care | Attending: Pulmonary Disease

## 2021-01-21 ENCOUNTER — Other Ambulatory Visit: Payer: Self-pay

## 2021-01-21 ENCOUNTER — Ambulatory Visit (HOSPITAL_COMMUNITY): Payer: 59 | Admitting: Anesthesiology

## 2021-01-21 ENCOUNTER — Encounter (HOSPITAL_COMMUNITY): Payer: Self-pay | Admitting: Cardiology

## 2021-01-21 ENCOUNTER — Inpatient Hospital Stay (HOSPITAL_COMMUNITY)
Admission: AD | Admit: 2021-01-21 | Discharge: 2021-03-08 | DRG: 270 | Disposition: E | Payer: 59 | Attending: Internal Medicine | Admitting: Internal Medicine

## 2021-01-21 DIAGNOSIS — Z515 Encounter for palliative care: Secondary | ICD-10-CM | POA: Diagnosis not present

## 2021-01-21 DIAGNOSIS — N184 Chronic kidney disease, stage 4 (severe): Secondary | ICD-10-CM | POA: Diagnosis present

## 2021-01-21 DIAGNOSIS — I484 Atypical atrial flutter: Secondary | ICD-10-CM | POA: Diagnosis not present

## 2021-01-21 DIAGNOSIS — I314 Cardiac tamponade: Secondary | ICD-10-CM | POA: Diagnosis not present

## 2021-01-21 DIAGNOSIS — E872 Acidosis: Secondary | ICD-10-CM | POA: Diagnosis present

## 2021-01-21 DIAGNOSIS — R140 Abdominal distension (gaseous): Secondary | ICD-10-CM

## 2021-01-21 DIAGNOSIS — F17210 Nicotine dependence, cigarettes, uncomplicated: Secondary | ICD-10-CM | POA: Diagnosis present

## 2021-01-21 DIAGNOSIS — Z452 Encounter for adjustment and management of vascular access device: Secondary | ICD-10-CM

## 2021-01-21 DIAGNOSIS — E785 Hyperlipidemia, unspecified: Secondary | ICD-10-CM | POA: Diagnosis present

## 2021-01-21 DIAGNOSIS — N400 Enlarged prostate without lower urinary tract symptoms: Secondary | ICD-10-CM | POA: Diagnosis present

## 2021-01-21 DIAGNOSIS — Z789 Other specified health status: Secondary | ICD-10-CM | POA: Diagnosis not present

## 2021-01-21 DIAGNOSIS — M542 Cervicalgia: Secondary | ICD-10-CM | POA: Diagnosis present

## 2021-01-21 DIAGNOSIS — M25512 Pain in left shoulder: Secondary | ICD-10-CM

## 2021-01-21 DIAGNOSIS — I5033 Acute on chronic diastolic (congestive) heart failure: Secondary | ICD-10-CM

## 2021-01-21 DIAGNOSIS — R570 Cardiogenic shock: Secondary | ICD-10-CM | POA: Diagnosis not present

## 2021-01-21 DIAGNOSIS — M109 Gout, unspecified: Secondary | ICD-10-CM | POA: Diagnosis present

## 2021-01-21 DIAGNOSIS — I350 Nonrheumatic aortic (valve) stenosis: Secondary | ICD-10-CM | POA: Diagnosis present

## 2021-01-21 DIAGNOSIS — A4159 Other Gram-negative sepsis: Secondary | ICD-10-CM | POA: Diagnosis not present

## 2021-01-21 DIAGNOSIS — Z880 Allergy status to penicillin: Secondary | ICD-10-CM

## 2021-01-21 DIAGNOSIS — E11649 Type 2 diabetes mellitus with hypoglycemia without coma: Secondary | ICD-10-CM | POA: Diagnosis not present

## 2021-01-21 DIAGNOSIS — N186 End stage renal disease: Secondary | ICD-10-CM | POA: Diagnosis present

## 2021-01-21 DIAGNOSIS — D6859 Other primary thrombophilia: Secondary | ICD-10-CM | POA: Diagnosis present

## 2021-01-21 DIAGNOSIS — L02214 Cutaneous abscess of groin: Secondary | ICD-10-CM | POA: Diagnosis present

## 2021-01-21 DIAGNOSIS — T17998A Other foreign object in respiratory tract, part unspecified causing other injury, initial encounter: Secondary | ICD-10-CM | POA: Diagnosis not present

## 2021-01-21 DIAGNOSIS — I959 Hypotension, unspecified: Secondary | ICD-10-CM

## 2021-01-21 DIAGNOSIS — Z6836 Body mass index (BMI) 36.0-36.9, adult: Secondary | ICD-10-CM | POA: Diagnosis not present

## 2021-01-21 DIAGNOSIS — J9602 Acute respiratory failure with hypercapnia: Secondary | ICD-10-CM | POA: Diagnosis not present

## 2021-01-21 DIAGNOSIS — I469 Cardiac arrest, cause unspecified: Secondary | ICD-10-CM | POA: Diagnosis not present

## 2021-01-21 DIAGNOSIS — J9811 Atelectasis: Secondary | ICD-10-CM | POA: Diagnosis not present

## 2021-01-21 DIAGNOSIS — Z7901 Long term (current) use of anticoagulants: Secondary | ICD-10-CM

## 2021-01-21 DIAGNOSIS — R001 Bradycardia, unspecified: Secondary | ICD-10-CM | POA: Diagnosis not present

## 2021-01-21 DIAGNOSIS — D631 Anemia in chronic kidney disease: Secondary | ICD-10-CM | POA: Diagnosis present

## 2021-01-21 DIAGNOSIS — R0603 Acute respiratory distress: Secondary | ICD-10-CM

## 2021-01-21 DIAGNOSIS — B964 Proteus (mirabilis) (morganii) as the cause of diseases classified elsewhere: Secondary | ICD-10-CM | POA: Diagnosis present

## 2021-01-21 DIAGNOSIS — E119 Type 2 diabetes mellitus without complications: Secondary | ICD-10-CM | POA: Diagnosis not present

## 2021-01-21 DIAGNOSIS — D6869 Other thrombophilia: Secondary | ICD-10-CM | POA: Diagnosis not present

## 2021-01-21 DIAGNOSIS — J811 Chronic pulmonary edema: Secondary | ICD-10-CM

## 2021-01-21 DIAGNOSIS — Z781 Physical restraint status: Secondary | ICD-10-CM

## 2021-01-21 DIAGNOSIS — R6521 Severe sepsis with septic shock: Secondary | ICD-10-CM | POA: Diagnosis not present

## 2021-01-21 DIAGNOSIS — E8779 Other fluid overload: Secondary | ICD-10-CM | POA: Diagnosis not present

## 2021-01-21 DIAGNOSIS — D509 Iron deficiency anemia, unspecified: Secondary | ICD-10-CM | POA: Diagnosis present

## 2021-01-21 DIAGNOSIS — N390 Urinary tract infection, site not specified: Secondary | ICD-10-CM | POA: Diagnosis present

## 2021-01-21 DIAGNOSIS — Z978 Presence of other specified devices: Secondary | ICD-10-CM

## 2021-01-21 DIAGNOSIS — Z9911 Dependence on respirator [ventilator] status: Secondary | ICD-10-CM

## 2021-01-21 DIAGNOSIS — I4430 Unspecified atrioventricular block: Secondary | ICD-10-CM

## 2021-01-21 DIAGNOSIS — I509 Heart failure, unspecified: Secondary | ICD-10-CM | POA: Diagnosis not present

## 2021-01-21 DIAGNOSIS — R578 Other shock: Secondary | ICD-10-CM | POA: Diagnosis not present

## 2021-01-21 DIAGNOSIS — I132 Hypertensive heart and chronic kidney disease with heart failure and with stage 5 chronic kidney disease, or end stage renal disease: Principal | ICD-10-CM | POA: Diagnosis present

## 2021-01-21 DIAGNOSIS — Z66 Do not resuscitate: Secondary | ICD-10-CM | POA: Diagnosis not present

## 2021-01-21 DIAGNOSIS — I471 Supraventricular tachycardia: Secondary | ICD-10-CM | POA: Diagnosis not present

## 2021-01-21 DIAGNOSIS — Z20822 Contact with and (suspected) exposure to covid-19: Secondary | ICD-10-CM | POA: Diagnosis present

## 2021-01-21 DIAGNOSIS — L02215 Cutaneous abscess of perineum: Secondary | ICD-10-CM | POA: Diagnosis not present

## 2021-01-21 DIAGNOSIS — A419 Sepsis, unspecified organism: Secondary | ICD-10-CM

## 2021-01-21 DIAGNOSIS — I440 Atrioventricular block, first degree: Secondary | ICD-10-CM | POA: Diagnosis present

## 2021-01-21 DIAGNOSIS — X58XXXA Exposure to other specified factors, initial encounter: Secondary | ICD-10-CM | POA: Diagnosis present

## 2021-01-21 DIAGNOSIS — I313 Pericardial effusion (noninflammatory): Secondary | ICD-10-CM | POA: Diagnosis present

## 2021-01-21 DIAGNOSIS — Z9289 Personal history of other medical treatment: Secondary | ICD-10-CM

## 2021-01-21 DIAGNOSIS — Z95828 Presence of other vascular implants and grafts: Secondary | ICD-10-CM | POA: Diagnosis not present

## 2021-01-21 DIAGNOSIS — Z888 Allergy status to other drugs, medicaments and biological substances status: Secondary | ICD-10-CM

## 2021-01-21 DIAGNOSIS — I483 Typical atrial flutter: Secondary | ICD-10-CM | POA: Diagnosis present

## 2021-01-21 DIAGNOSIS — N2581 Secondary hyperparathyroidism of renal origin: Secondary | ICD-10-CM | POA: Diagnosis present

## 2021-01-21 DIAGNOSIS — E8770 Fluid overload, unspecified: Secondary | ICD-10-CM

## 2021-01-21 DIAGNOSIS — Z7189 Other specified counseling: Secondary | ICD-10-CM | POA: Diagnosis not present

## 2021-01-21 DIAGNOSIS — J9690 Respiratory failure, unspecified, unspecified whether with hypoxia or hypercapnia: Secondary | ICD-10-CM

## 2021-01-21 DIAGNOSIS — R0602 Shortness of breath: Secondary | ICD-10-CM

## 2021-01-21 DIAGNOSIS — J9600 Acute respiratory failure, unspecified whether with hypoxia or hypercapnia: Secondary | ICD-10-CM

## 2021-01-21 DIAGNOSIS — E1122 Type 2 diabetes mellitus with diabetic chronic kidney disease: Secondary | ICD-10-CM | POA: Diagnosis present

## 2021-01-21 DIAGNOSIS — E878 Other disorders of electrolyte and fluid balance, not elsewhere classified: Secondary | ICD-10-CM | POA: Diagnosis not present

## 2021-01-21 DIAGNOSIS — I10 Essential (primary) hypertension: Secondary | ICD-10-CM | POA: Diagnosis present

## 2021-01-21 DIAGNOSIS — I468 Cardiac arrest due to other underlying condition: Secondary | ICD-10-CM | POA: Diagnosis not present

## 2021-01-21 DIAGNOSIS — R0902 Hypoxemia: Secondary | ICD-10-CM

## 2021-01-21 DIAGNOSIS — Z8249 Family history of ischemic heart disease and other diseases of the circulatory system: Secondary | ICD-10-CM

## 2021-01-21 DIAGNOSIS — L732 Hidradenitis suppurativa: Secondary | ICD-10-CM | POA: Diagnosis present

## 2021-01-21 DIAGNOSIS — I4892 Unspecified atrial flutter: Secondary | ICD-10-CM | POA: Diagnosis present

## 2021-01-21 DIAGNOSIS — I9589 Other hypotension: Secondary | ICD-10-CM | POA: Diagnosis present

## 2021-01-21 DIAGNOSIS — B957 Other staphylococcus as the cause of diseases classified elsewhere: Secondary | ICD-10-CM | POA: Diagnosis present

## 2021-01-21 DIAGNOSIS — R0781 Pleurodynia: Secondary | ICD-10-CM

## 2021-01-21 DIAGNOSIS — I4510 Unspecified right bundle-branch block: Secondary | ICD-10-CM | POA: Diagnosis not present

## 2021-01-21 DIAGNOSIS — R319 Hematuria, unspecified: Secondary | ICD-10-CM | POA: Diagnosis not present

## 2021-01-21 DIAGNOSIS — I4891 Unspecified atrial fibrillation: Secondary | ICD-10-CM | POA: Diagnosis present

## 2021-01-21 DIAGNOSIS — E440 Moderate protein-calorie malnutrition: Secondary | ICD-10-CM | POA: Diagnosis present

## 2021-01-21 DIAGNOSIS — N179 Acute kidney failure, unspecified: Secondary | ICD-10-CM | POA: Diagnosis not present

## 2021-01-21 DIAGNOSIS — Z8711 Personal history of peptic ulcer disease: Secondary | ICD-10-CM

## 2021-01-21 DIAGNOSIS — Z79899 Other long term (current) drug therapy: Secondary | ICD-10-CM

## 2021-01-21 DIAGNOSIS — R092 Respiratory arrest: Secondary | ICD-10-CM | POA: Diagnosis not present

## 2021-01-21 DIAGNOSIS — D539 Nutritional anemia, unspecified: Secondary | ICD-10-CM | POA: Diagnosis present

## 2021-01-21 DIAGNOSIS — Z4659 Encounter for fitting and adjustment of other gastrointestinal appliance and device: Secondary | ICD-10-CM

## 2021-01-21 DIAGNOSIS — J9601 Acute respiratory failure with hypoxia: Secondary | ICD-10-CM | POA: Diagnosis not present

## 2021-01-21 DIAGNOSIS — R627 Adult failure to thrive: Secondary | ICD-10-CM | POA: Diagnosis present

## 2021-01-21 DIAGNOSIS — Z96642 Presence of left artificial hip joint: Secondary | ICD-10-CM | POA: Diagnosis present

## 2021-01-21 DIAGNOSIS — R0789 Other chest pain: Secondary | ICD-10-CM | POA: Diagnosis not present

## 2021-01-21 HISTORY — PX: CARDIOVERSION: SHX1299

## 2021-01-21 LAB — RENAL FUNCTION PANEL
Albumin: 2.7 g/dL — ABNORMAL LOW (ref 3.5–5.0)
Anion gap: 15 (ref 5–15)
BUN: 106 mg/dL — ABNORMAL HIGH (ref 8–23)
CO2: 23 mmol/L (ref 22–32)
Calcium: 11.4 mg/dL — ABNORMAL HIGH (ref 8.9–10.3)
Chloride: 99 mmol/L (ref 98–111)
Creatinine, Ser: 5.41 mg/dL — ABNORMAL HIGH (ref 0.61–1.24)
GFR, Estimated: 11 mL/min — ABNORMAL LOW (ref >60)
Glucose, Bld: 103 mg/dL — ABNORMAL HIGH (ref 70–99)
Phosphorus: 8.2 mg/dL — ABNORMAL HIGH (ref 2.5–4.6)
Potassium: 3.6 mmol/L (ref 3.5–5.1)
Sodium: 137 mmol/L (ref 135–145)

## 2021-01-21 LAB — MAGNESIUM: Magnesium: 2.2 mg/dL (ref 1.7–2.4)

## 2021-01-21 LAB — PROTIME-INR
INR: 2.7 — ABNORMAL HIGH (ref 0.8–1.2)
Prothrombin Time: 27.8 seconds — ABNORMAL HIGH (ref 11.4–15.2)

## 2021-01-21 LAB — IRON AND TIBC
Iron: 13 ug/dL — ABNORMAL LOW (ref 45–182)
Saturation Ratios: 6 % — ABNORMAL LOW (ref 17.9–39.5)
TIBC: 204 ug/dL — ABNORMAL LOW (ref 250–450)
UIBC: 191 ug/dL

## 2021-01-21 LAB — FERRITIN: Ferritin: 753 ng/mL — ABNORMAL HIGH (ref 24–336)

## 2021-01-21 SURGERY — CARDIOVERSION
Anesthesia: General

## 2021-01-21 MED ORDER — METOPROLOL SUCCINATE ER 25 MG PO TB24
12.5000 mg | ORAL_TABLET | Freq: Every day | ORAL | Status: DC
  Administered 2021-01-22 – 2021-01-24 (×3): 12.5 mg via ORAL
  Filled 2021-01-21 (×4): qty 1

## 2021-01-21 MED ORDER — PHENYLEPHRINE HCL-NACL 10-0.9 MG/250ML-% IV SOLN
INTRAVENOUS | Status: DC | PRN
  Administered 2021-01-21: 25 ug/min via INTRAVENOUS

## 2021-01-21 MED ORDER — TAMSULOSIN HCL 0.4 MG PO CAPS
0.4000 mg | ORAL_CAPSULE | Freq: Every day | ORAL | Status: DC
  Administered 2021-01-21 – 2021-01-28 (×6): 0.4 mg via ORAL
  Filled 2021-01-21 (×7): qty 1

## 2021-01-21 MED ORDER — ADULT MULTIVITAMIN W/MINERALS CH
1.0000 | ORAL_TABLET | Freq: Every day | ORAL | Status: DC
  Administered 2021-01-22 – 2021-01-24 (×3): 1 via ORAL
  Filled 2021-01-21 (×3): qty 1

## 2021-01-21 MED ORDER — FUROSEMIDE 10 MG/ML IJ SOLN
INTRAMUSCULAR | Status: AC
  Filled 2021-01-21: qty 8

## 2021-01-21 MED ORDER — LIDOCAINE 2% (20 MG/ML) 5 ML SYRINGE
INTRAMUSCULAR | Status: DC | PRN
  Administered 2021-01-21: 60 mg via INTRAVENOUS

## 2021-01-21 MED ORDER — VITAMIN B-12 1000 MCG PO TABS
1000.0000 ug | ORAL_TABLET | Freq: Every day | ORAL | Status: DC
  Administered 2021-01-22 – 2021-01-28 (×6): 1000 ug via ORAL
  Filled 2021-01-21 (×7): qty 1

## 2021-01-21 MED ORDER — APIXABAN 5 MG PO TABS
5.0000 mg | ORAL_TABLET | Freq: Two times a day (BID) | ORAL | Status: DC
  Administered 2021-01-21 – 2021-01-25 (×9): 5 mg via ORAL
  Filled 2021-01-21 (×10): qty 1

## 2021-01-21 MED ORDER — ACETAMINOPHEN 650 MG RE SUPP: 650.0000 mg | Freq: Four times a day (QID) | RECTAL | Status: DC | PRN

## 2021-01-21 MED ORDER — NA FERRIC GLUC CPLX IN SUCROSE 12.5 MG/ML IV SOLN
250.0000 mg | INTRAVENOUS | Status: DC
  Administered 2021-01-21 – 2021-01-28 (×2): 250 mg via INTRAVENOUS
  Filled 2021-01-21 (×3): qty 20

## 2021-01-21 MED ORDER — FUROSEMIDE 10 MG/ML IJ SOLN
80.0000 mg | Freq: Three times a day (TID) | INTRAMUSCULAR | Status: DC
  Administered 2021-01-21 (×2): 80 mg via INTRAVENOUS
  Filled 2021-01-21 (×2): qty 8

## 2021-01-21 MED ORDER — FERROUS SULFATE 325 (65 FE) MG PO TABS
325.0000 mg | ORAL_TABLET | Freq: Every day | ORAL | Status: DC
  Administered 2021-01-22: 325 mg via ORAL
  Filled 2021-01-21: qty 1

## 2021-01-21 MED ORDER — LORATADINE 10 MG PO TABS
10.0000 mg | ORAL_TABLET | Freq: Every day | ORAL | Status: DC
  Administered 2021-01-22 – 2021-01-28 (×6): 10 mg via ORAL
  Filled 2021-01-21 (×7): qty 1

## 2021-01-21 MED ORDER — POLYETHYLENE GLYCOL 3350 17 G PO PACK
17.0000 g | PACK | Freq: Two times a day (BID) | ORAL | Status: DC | PRN
  Filled 2021-01-21: qty 1

## 2021-01-21 MED ORDER — PHENYLEPHRINE 40 MCG/ML (10ML) SYRINGE FOR IV PUSH (FOR BLOOD PRESSURE SUPPORT)
PREFILLED_SYRINGE | INTRAVENOUS | Status: DC | PRN
  Administered 2021-01-21 (×2): 120 ug via INTRAVENOUS
  Administered 2021-01-21: 80 ug via INTRAVENOUS
  Administered 2021-01-21: 120 ug via INTRAVENOUS

## 2021-01-21 MED ORDER — PROPOFOL 10 MG/ML IV BOLUS
INTRAVENOUS | Status: DC | PRN
  Administered 2021-01-21: 90 mg via INTRAVENOUS
  Administered 2021-01-21: 50 mg via INTRAVENOUS

## 2021-01-21 MED ORDER — SODIUM CHLORIDE 0.9 % IV SOLN
INTRAVENOUS | Status: AC | PRN
Start: 2021-01-21 — End: 2021-01-21
  Administered 2021-01-21: 500 mL via INTRAVENOUS

## 2021-01-21 MED ORDER — SEVELAMER CARBONATE 800 MG PO TABS
800.0000 mg | ORAL_TABLET | Freq: Three times a day (TID) | ORAL | Status: DC
  Administered 2021-01-21 – 2021-01-22 (×2): 800 mg via ORAL
  Filled 2021-01-21 (×4): qty 1

## 2021-01-21 MED ORDER — ACETAMINOPHEN 325 MG PO TABS
650.0000 mg | ORAL_TABLET | Freq: Four times a day (QID) | ORAL | Status: DC | PRN
  Administered 2021-01-22 – 2021-01-23 (×2): 650 mg via ORAL
  Filled 2021-01-21 (×2): qty 2

## 2021-01-21 MED ORDER — MIDODRINE HCL 5 MG PO TABS
15.0000 mg | ORAL_TABLET | Freq: Three times a day (TID) | ORAL | Status: DC
  Administered 2021-01-21 – 2021-01-28 (×20): 15 mg via ORAL
  Filled 2021-01-21 (×23): qty 3

## 2021-01-21 NOTE — Procedures (Addendum)
Procedure: Electrical Cardioversion Indications:  Atrial Flutter  Procedure Details:  Consent: Risks of procedure as well as the alternatives and risks of each were explained to the (patient/caregiver).  Consent for procedure obtained.  Time Out: Verified patient identification, verified procedure, site/side was marked, verified correct patient position, special equipment/implants available, medications/allergies/relevent history reviewed, required imaging and test results available. PERFORMED.  Patient placed on cardiac monitor, pulse oximetry, supplemental oxygen as necessary.  Sedation given: propofol '140mg'$ ; lidocaine '40mg'$  administered by anesthesia Pacer pads placed anterior and posterior chest.  Cardioverted 1 time(s).  Cardioversion with synchronized biphasic 200J shock.  Evaluation: Findings: Post procedure EKG shows: Sinus with mobitz 1 AVB and PACs Complications: None  After the procedure, the patient was hypotensive requiring pressor support. Was also notably overloaded on physical exam. Given tenuous status, decision was made to admit to in-patient for pressor support and IV diuresis. Plan discussed with admitting physician, Dr. Ellyn Hack and primary cardiologist Dr. Margaretann Loveless.  Time Spent Directly with the Patient:  52mnutes   HFreada Bergeron3/16/2022, 12:47 PM

## 2021-01-21 NOTE — Anesthesia Preprocedure Evaluation (Signed)
Anesthesia Evaluation  Patient identified by MRN, date of birth, ID band Patient awake    Reviewed: Allergy & Precautions, H&P , NPO status , Patient's Chart, lab work & pertinent test results  Airway Mallampati: II   Neck ROM: full    Dental   Pulmonary shortness of breath, Current Smoker and Patient abstained from smoking.,    breath sounds clear to auscultation       Cardiovascular hypertension, + dysrhythmias Atrial Fibrillation  Rhythm:irregular Rate:Normal     Neuro/Psych    GI/Hepatic PUD,   Endo/Other  diabetes, Type 2obese  Renal/GU Renal InsufficiencyRenal disease     Musculoskeletal  (+) Arthritis ,   Abdominal   Peds  Hematology   Anesthesia Other Findings   Reproductive/Obstetrics                             Anesthesia Physical Anesthesia Plan  ASA: III  Anesthesia Plan: General   Post-op Pain Management:    Induction: Intravenous  PONV Risk Score and Plan: 1 and Treatment may vary due to age or medical condition and Propofol infusion  Airway Management Planned: Mask  Additional Equipment:   Intra-op Plan:   Post-operative Plan:   Informed Consent: I have reviewed the patients History and Physical, chart, labs and discussed the procedure including the risks, benefits and alternatives for the proposed anesthesia with the patient or authorized representative who has indicated his/her understanding and acceptance.     Dental advisory given  Plan Discussed with: CRNA and Anesthesiologist  Anesthesia Plan Comments:         Anesthesia Quick Evaluation

## 2021-01-21 NOTE — Interval H&P Note (Signed)
History and Physical Interval Note:  01/30/2021 12:02 PM  Mark [name]  has presented today for surgery, with the diagnosis of AFIB.  The various methods of treatment have been discussed with the patient and family. After consideration of risks, benefits and other options for treatment, the patient has consented to  Procedure(s): CARDIOVERSION (N/A) as a surgical intervention.  The patient's history has been reviewed, patient examined, no change in status, stable for surgery.  I have reviewed the patient's chart and labs.  Questions were answered to the patient's satisfaction.     Freada Bergeron

## 2021-01-21 NOTE — Transfer of Care (Signed)
Immediate Anesthesia Transfer of Care Note  Patient: Mark [name]  Procedure(s) Performed: CARDIOVERSION (N/A )  Patient Location: PACU  Anesthesia Type:General  Level of Consciousness: awake and patient cooperative  Airway & Oxygen Therapy: Patient Spontanous Breathing and Patient connected to nasal cannula oxygen  Post-op Assessment: Report given to RN and Post -op Vital signs reviewed and stable  Post vital signs: Reviewed and stable  Last Vitals:  Vitals Value Taken Time  BP 111/60 01/13/2021 1246  Temp    Pulse 103 01/10/2021 1248  Resp 30 01/28/2021 1248  SpO2 100 % 01/07/2021 1248  Vitals shown include unvalidated device data.  Last Pain:  Vitals:   01/08/2021 1120  TempSrc: Oral  PainSc: 0-No pain         Complications: No complications documented.

## 2021-01-21 NOTE — Progress Notes (Signed)
Patient up to chair in PACU, tolerated well. VS stable, BP 109/66 (75) @ 1351-off NEO since 1300, 2L Nasal Cannula 100%. Will continue to monitor.  Rowe Pavy, RN

## 2021-01-21 NOTE — Anesthesia Procedure Notes (Signed)
Procedure Name: General with mask airway Date/Time: 01/31/2021 12:15 PM Performed by: Renato Shin, CRNA Pre-anesthesia Checklist: Patient identified, Emergency Drugs available, Suction available and Patient being monitored Patient Re-evaluated:Patient Re-evaluated prior to induction Oxygen Delivery Method: Ambu bag Preoxygenation: Pre-oxygenation with 100% oxygen Induction Type: IV induction Ventilation: Mask ventilation without difficulty Number of attempts: 1 Airway Equipment and Method: Oral airway Placement Confirmation: positive ETCO2 and breath sounds checked- equal and bilateral Dental Injury: Teeth and Oropharynx as per pre-operative assessment

## 2021-01-21 NOTE — Consult Note (Signed)
Reason for Consult: Volume overload in patient with chronic kidney disease stage V Referring Physician: Barb Merino MD [facility])  HPI:  60 year old African-American man with past medical history significant for hypertension, diabetes mellitus, dyslipidemia, obesity, gout, recurrent left hip dislocation and chronic kidney disease stage V (baseline creatinine around 5.0) following recent progression/hospitalization between 2/6 and 2/15 for acute exacerbation of diastolic heart failure.  He has a left radiocephalic fistula that was created in October, 2017 by Dr. Scot Dock that is mature.  He is admitted to the hospital after earlier undergoing DCCV for atrial flutter after noticed to have increasing dyspnea with worsening lower extremity edema and transient hypotension.  He denies any chest pain but reports worsening shortness of breath with exertion over the past 3 to 4 days along with worsening lower extremity edema.  He has remained adherent with furosemide 80 mg twice a day and other than a single can of soup, does not admit to indiscretions with his diet.  He denies any fevers or chills and does not have any nausea, vomiting or diarrhea.  He denies any dysuria, urgency, frequency, flank pain, fever or chills and continues to take tamsulosin.  When seen last week by cardiology, he had only mild lower extremity edema without JVD.  Past Medical History:  Diagnosis Date  . Anemia    labs today indicate (09/27/11)  . Arthritis    right knee  . Bleeding ulcer    hx of, required 6 units blood  . Blood transfusion    6 units with ulcer repair surgery  . Chronic kidney disease    stage IV, has fistula, never started diaylsis  . Complication of anesthesia    little slow to wake up with last surgery  . Dyspnea    with exertion  . Gout   . Hip dislocation, left ([place]) 10/09/2020  . Hypertension   . Osteoporosis   . Pneumonia     Past Surgical History:  Procedure Laterality Date  . AV FISTULA  PLACEMENT Left 09/07/2016   Procedure: ARTERIOVENOUS (AV) FISTULA CREATION LEFT ARM;  Surgeon: Angelia Mould, MD;  Location: [place];  Service: Vascular;  Laterality: Left;  . COLONOSCOPY    . EXTERNAL EAR SURGERY    . HIP CLOSED REDUCTION Left 07/27/2020   Procedure: CLOSED REDUCTION HIP;  Surgeon: Marchia Bond, MD;  Location: [place];  Service: Orthopedics;  Laterality: Left;  . HIP PINNING  11/2010  . JOINT REPLACEMENT    . OTHER SURGICAL HISTORY  ~ 08/1998   repair bleeding ulcer  . SHOULDER ARTHROSCOPY WITH ROTATOR CUFF REPAIR AND SUBACROMIAL DECOMPRESSION Right 05/26/2017   Procedure: SHOULDER ARTHROSCOPY WITH ROTATOR CUFF REPAIR AND SUBACROMIAL DECOMPRESSION;  Surgeon: Tania Ade, MD;  Location: [place];  Service: Orthopedics;  Laterality: Right;  SHOULDER ARTHROSCOPY WITH ROTATOR CUFF REPAIR AND SUBACROMIAL DECOMPRESSION  . TOTAL HIP ARTHROPLASTY  10/06/11   left  . TOTAL HIP ARTHROPLASTY  10/06/2011   Procedure: TOTAL HIP ARTHROPLASTY;  Surgeon: Kerin Salen;  Location: [place];  Service: Orthopedics;  Laterality: Left;    Family History  Problem Relation Age of Onset  . Cancer Mother   . Atrial fibrillation Mother   . Hypertension Mother   . Cancer Father     Social History:  reports that he has been smoking cigarettes. He has a 8.00 pack-year smoking history. He quit smokeless tobacco use about 47 years ago.  His smokeless tobacco use included chew. He reports current alcohol use of about  5.0 standard drinks of alcohol per week. He reports that he does not use drugs.  Allergies:  Allergies  Allergen Reactions  . Penicillins Swelling, Rash and Other (See Comments)    Arms and eyes swell & skin breaks out  Has patient had a PCN reaction causing immediate rash, facial/tongue/throat swelling, SOB or lightheadedness with hypotension: Yes Has patient had a PCN reaction causing severe rash involving mucus membranes or skin necrosis: Yes Has patient had a PCN reaction that  required hospitalization: No Has patient had a PCN reaction occurring within the last 10 years: No If all of the above answers are "NO", then may proceed with Cephalosporin use.   . Atorvastatin     Muscle pain in legs  . Rosuvastatin     Muscle pain in legs    Medications:  Scheduled: . furosemide  80 mg Intravenous TID    BMP Latest Ref Rng & Units 01/16/2021 12/23/2020 12/22/2020  Glucose 65 - 99 mg/dL 134(H) 93 86  BUN 8 - 27 mg/dL 105(HH) 93(H) 97(H)  Creatinine 0.76 - 1.27 mg/dL 5.73(H) 5.06(H) 4.91(H)  BUN/Creat Ratio 10 - 24 18 - -  Sodium 134 - 144 mmol/L 139 133(L) 135  Potassium 3.5 - 5.2 mmol/L 4.4 3.8 5.1  Chloride 96 - 106 mmol/L 101 93(L) 94(L)  CO2 20 - 29 mmol/L 17(L) 29 27  Calcium 8.6 - 10.2 mg/dL 11.2(H) 11.4(H) 11.0(H)   CBC Latest Ref Rng & Units 01/16/2021 01/14/2021 12/19/2020  WBC 3.4 - 10.8 x10E3/uL 10.4 - 9.9  Hemoglobin 13.0 - 17.7 g/dL 8.4(L) 7.9(L) 9.2(L)  Hematocrit 37.5 - 51.0 % 25.7(L) - 29.2(L)  Platelets 150 - 450 x10E3/uL 238 - 160     Review of Systems  Constitutional: Positive for fatigue. Negative for appetite change and chills.  HENT: Negative for ear pain, nosebleeds, sinus pressure and sore throat.   Eyes: Negative for pain and visual disturbance.  Respiratory: Positive for shortness of breath. Negative for cough, choking and chest tightness.   Cardiovascular: Positive for palpitations and leg swelling.  Gastrointestinal: Positive for abdominal distention. Negative for abdominal pain, constipation, diarrhea, nausea and vomiting.  Genitourinary: Negative for dysuria, frequency, hematuria and urgency.  Musculoskeletal: Negative for back pain, joint swelling and myalgias.  Skin: Negative for rash.  Neurological: Negative for dizziness, tremors and weakness.   Blood pressure 109/66, pulse (!) 117, temperature 97.7 F (36.5 C), resp. rate (!) 25, height '5\' 9"'$  (1.753 m), weight 111.8 kg, SpO2 97 %. Physical Exam Vitals reviewed.   Constitutional:      Appearance: Normal appearance. He is obese.     Comments: Somnolent resting in recliner in PACU  HENT:     Head: Normocephalic and atraumatic.     Right Ear: External ear normal.     Left Ear: External ear normal.     Nose: Nose normal.     Mouth/Throat:     Mouth: Mucous membranes are dry.     Pharynx: Oropharynx is clear.  Eyes:     Extraocular Movements: Extraocular movements intact.     Conjunctiva/sclera: Conjunctivae normal.  Neck:     Comments: JVP to angle of jaw Cardiovascular:     Rate and Rhythm: Normal rate and regular rhythm.     Heart sounds: Murmur heard.      Comments: 3/6 holosystolic murmur over outflow tract Pulmonary:     Breath sounds: Rales present. No wheezing or rhonchi.     Comments: Fine rales left base Abdominal:  General: Bowel sounds are normal. There is distension.     Palpations: Abdomen is soft.     Tenderness: There is no guarding.     Comments: Moderate global distention  Musculoskeletal:     Cervical back: Normal range of motion and neck supple.     Right lower leg: Edema present.     Left lower leg: Edema present.     Comments: 2-3+ lower extremity edema bilaterally.  Left radiocephalic fistula with pulsatile/aneurysmal inflow segment  Skin:    General: Skin is warm and dry.  Neurological:     Mental Status: He is oriented to person, place, and time.    Assessment/Plan: 1.  Acute exacerbation of diastolic heart failure: He appears to be having an element of diuretic resistance in the setting of decreasing renal function and will be admitted for intravenous diuretics.  I have started him on IV furosemide 80 mg 3 times daily for the next 3 doses and then will escalate or de-escalate dosing as indicated by urine output and weight change/labs. 2.  Chronic kidney disease stage V: Recent labs likely reflective of progression versus acute kidney injury of cardiorenal nature.  We will check labs today in order to assess  most recent GFR/status of electrolytes while undertaking diuresis.  We will continue to monitor closely for indicators for dialysis.  He has a mature left RCF and is suitable for cannulation if needed for dialysis. 3.  Hypertension: Hold antihypertensive therapy at this time with increasing diuretics and earlier noted hypotension. 4.  Anemia of chronic disease: We will check CBC with labs tomorrow along with iron studies to decide on need for intravenous iron versus ESA. 5.  Secondary hyperparathyroidism: Check renal panel to assess calcium/phosphorus level.  PATEL,JAY K. 01/13/2021, 2:43 PM

## 2021-01-21 NOTE — Progress Notes (Signed)
Pt defibrillated x 1, pressures low rhythm unstable, transferred to PACU waiting for admission to inpatient.

## 2021-01-21 NOTE — Progress Notes (Signed)
Patient presented for DCCV today. He was notably short of breath and had noticed worsening LE edema. Exam notable for 3/6 systolic mumur (has moderate AS), JVD to the ear lobe, and 1+ pitting edema. Has been taking lasix '80mg'$  PO BID at home in the setting of known CKD V. States that his symptoms have been worsening over the past week likely secondary to sustained atrial flutter. Underwent DCCV today where he converted from [place] to what appeared to be sinus with Mobitz type I block with PACs and PVCs. Was hypotensive post-procedure requiring neo for blood pressure support. Given his tenuous clinical status, the decision was made to admit to the hospital for pressor support as needed as well as IV diuresis. Patient is currently moved to the PACU for further monitoring and the Cardiology team will consult with medicine as primary. Plan discussed with on-call Cardiology team.  Gwyndolyn Kaufman, MD

## 2021-01-21 NOTE — H&P (Signed)
History and Physical    TACOMA DRUMM [id number] DOB: Dec 30, 1960 DOA: 02/05/2021  PCP: Delrae Rend, MD  Patient coming from: Home  I have personally briefly reviewed patient's old medical records available.   Chief Complaint: Shortness of breath, leg swelling, palpitations  HPI: Mark [name] is a 60 y.o. male with medical history significant of hypertension, hyperlipidemia, obesity, chronic kidney disease stage V with baseline creatinine of 5, managed conservatively, left arm fistula in anticipation of hemodialysis, atrial flutter who was brought to elective cardioversion today, found to have significant fluid overload and needing admission.  Patient is at PACU, currently complains of some neck pain.  Patient is complaining of worsening shortness of breath with mobility and was having progressive swelling of the legs for many weeks now, worse for last few days.  He has been managed by nephrology as outpatient. Postprocedure course: Underwent DCCV for a flutter, became transiently hypotensive.  Stabilized with Neo-Synephrine drip and now off and asymptomatic.   Patient currently denies any headache, nausea, vomiting.  Denies any chest pain.  Denies any palpitations at this time.  At rest he has some neck pain otherwise no complaints.  He has edematous legs.  Appetite is fair.  Walking is difficult every day because of swelling. With significant fluid overload and needing intravenous diuretics, possible hemodialysis cardiology requested admission with hospitalist service.  Review of Systems: all systems are reviewed and pertinent positive as per HPI otherwise rest are negative.    Past Medical History:  Diagnosis Date  . Anemia    labs today indicate (09/27/11)  . Arthritis    right knee  . Bleeding ulcer    hx of, required 6 units blood  . Blood transfusion    6 units with ulcer repair surgery  . Chronic kidney disease    stage IV, has fistula, never started diaylsis  .  Complication of anesthesia    little slow to wake up with last surgery  . Dyspnea    with exertion  . Gout   . Hip dislocation, left ([place]) 10/09/2020  . Hypertension   . Osteoporosis   . Pneumonia     Past Surgical History:  Procedure Laterality Date  . AV FISTULA PLACEMENT Left 09/07/2016   Procedure: ARTERIOVENOUS (AV) FISTULA CREATION LEFT ARM;  Surgeon: Angelia Mould, MD;  Location: [place];  Service: Vascular;  Laterality: Left;  . COLONOSCOPY    . EXTERNAL EAR SURGERY    . HIP CLOSED REDUCTION Left 07/27/2020   Procedure: CLOSED REDUCTION HIP;  Surgeon: Marchia Bond, MD;  Location: [place];  Service: Orthopedics;  Laterality: Left;  . HIP PINNING  11/2010  . JOINT REPLACEMENT    . OTHER SURGICAL HISTORY  ~ 08/1998   repair bleeding ulcer  . SHOULDER ARTHROSCOPY WITH ROTATOR CUFF REPAIR AND SUBACROMIAL DECOMPRESSION Right 05/26/2017   Procedure: SHOULDER ARTHROSCOPY WITH ROTATOR CUFF REPAIR AND SUBACROMIAL DECOMPRESSION;  Surgeon: Tania Ade, MD;  Location: [place];  Service: Orthopedics;  Laterality: Right;  SHOULDER ARTHROSCOPY WITH ROTATOR CUFF REPAIR AND SUBACROMIAL DECOMPRESSION  . TOTAL HIP ARTHROPLASTY  10/06/11   left  . TOTAL HIP ARTHROPLASTY  10/06/2011   Procedure: TOTAL HIP ARTHROPLASTY;  Surgeon: Kerin Salen;  Location: [place];  Service: Orthopedics;  Laterality: Left;    Social history   reports that he has been smoking cigarettes. He has a 8.00 pack-year smoking history. He quit smokeless tobacco use about 47 years ago.  His smokeless tobacco use included chew.  He reports current alcohol use of about 5.0 standard drinks of alcohol per week. He reports that he does not use drugs.  Allergies  Allergen Reactions  . Penicillins Swelling, Rash and Other (See Comments)    Arms and eyes swell & skin breaks out  Has patient had a PCN reaction causing immediate rash, facial/tongue/throat swelling, SOB or lightheadedness with hypotension: Yes Has patient had a  PCN reaction causing severe rash involving mucus membranes or skin necrosis: Yes Has patient had a PCN reaction that required hospitalization: No Has patient had a PCN reaction occurring within the last 10 years: No If all of the above answers are "NO", then may proceed with Cephalosporin use.   . Atorvastatin     Muscle pain in legs  . Rosuvastatin     Muscle pain in legs    Family History  Problem Relation Age of Onset  . Cancer Mother   . Atrial fibrillation Mother   . Hypertension Mother   . Cancer Father      Prior to Admission medications   Medication Sig Start Date End Date Taking? Authorizing Provider  apixaban (ELIQUIS) 5 MG TABS tablet Take 1 tablet (5 mg total) by mouth 2 (two) times daily. 12/05/20  Yes [name], MD  ferrous sulfate 325 (65 FE) MG tablet Take 1 tablet (325 mg total) by mouth daily with breakfast. 08/18/20  Yes Medina-Vargas, Monina C, NP  furosemide (LASIX) 80 MG tablet Take 1 tablet (80 mg total) by mouth 2 (two) times daily. 12/23/20  Yes Geradine Girt, DO  Ketotifen Fumarate (ALLERGY EYE DROPS OP) Place 1 drop into both eyes daily as needed (allergies).   Yes [provider]  loratadine (CLARITIN) 10 MG tablet Take 10 mg by mouth daily.   Yes [provider]  Menthol-Zinc Oxide (GOLD BOND EX) Apply 1 application topically daily as needed (itching).   Yes [provider]  metoprolol succinate (TOPROL-XL) 25 MG 24 hr tablet Take 12.5 mg by mouth daily.   Yes [provider]  midodrine (PROAMATINE) 5 MG tablet Take 3 tablets (15 mg total) by mouth 3 (three) times daily with meals. 12/23/20  Yes Geradine Girt, DO  Multiple Vitamin (MULTIVITAMIN WITH MINERALS) TABS tablet Take 1 tablet by mouth daily after breakfast.   Yes [provider]  polyethylene glycol (MIRALAX / GLYCOLAX) 17 g packet Take 17 g by mouth 2 (two) times daily as needed for mild constipation. 10/15/20  Yes Mercy Riding, MD  sevelamer  carbonate (RENVELA) 800 MG tablet Take 1 tablet (800 mg total) by mouth 3 (three) times daily with meals. 10/29/20  Yes Medina-Vargas, Monina C, NP  tamsulosin (FLOMAX) 0.4 MG CAPS capsule Take 1 capsule (0.4 mg total) by mouth daily. 12/24/20  Yes Geradine Girt, DO  vitamin B-12 (CYANOCOBALAMIN) 1000 MCG tablet Take 1,000 mcg by mouth daily.   Yes [provider]    Physical Exam: Vitals:   01/07/2021 1520 02/03/2021 1530 01/20/2021 1545 01/23/2021 1550  BP: 104/66   111/79  Pulse: 79 (!) 50 81 80  Resp: (!) 29 (!) 27 (!) 28 (!) 24  Temp:      TempSrc:      SpO2: 97% 100% 100% 100%  Weight:      Height:        Constitutional: Chronically sick looking.  Currently comfortable on 1 to 2 L of oxygen.  Recently had anesthesia and feels sleepy. Vitals:   01/25/2021 1520  02/05/2021 1530 01/28/2021 1545 01/09/2021 1550  BP: 104/66   111/79  Pulse: 79 (!) 50 81 80  Resp: (!) 29 (!) 27 (!) 28 (!) 24  Temp:      TempSrc:      SpO2: 97% 100% 100% 100%  Weight:      Height:       Eyes: PERRL, lids and conjunctivae normal ENMT: Mucous membranes are moist. Posterior pharynx clear of any exudate or lesions.Normal dentition.  Neck: normal, supple, no masses, no thyromegaly Respiratory: Few basal crackles.  Otherwise clear.  Cardiovascular: Regular rate and rhythm, no murmurs / rubs / gallops.  2+ pedal edema bilateral.  2+ pedal pulses. No carotid bruits.  Abdomen: no tenderness, no masses palpated. No hepatosplenomegaly. Bowel sounds positive.  Musculoskeletal: no clubbing / cyanosis. No joint deformity upper and lower extremities. Good ROM, no contractures. Normal muscle tone.  Left arm AV fistula with thrill. Skin: no rashes, lesions, ulcers. No induration Neurologic: CN 2-12 grossly intact. Sensation intact, DTR normal. Strength 5/5 in all 4.  Psychiatric: Normal judgment and insight. Alert and oriented x 3. Normal mood.     Labs on Admission: I have personally reviewed following labs and  imaging studies  CBC: Recent Labs  Lab 01/16/21 1241  WBC 10.4  HGB 8.4*  HCT 25.7*  MCV 97  PLT 99991111   Basic Metabolic Panel: Recent Labs  Lab 01/16/21 1241  NA 139  K 4.4  CL 101  CO2 17*  GLUCOSE 134*  BUN 105*  CREATININE 5.73*  CALCIUM 11.2*   GFR: Estimated Creatinine Clearance: 16.7 mL/min (A) (by C-G formula based on SCr of 5.73 mg/dL (H)). Liver Function Tests: No results for input(s): AST, ALT, ALKPHOS, BILITOT, PROT, ALBUMIN in the last 168 hours. No results for input(s): LIPASE, AMYLASE in the last 168 hours. No results for input(s): AMMONIA in the last 168 hours. Coagulation Profile: No results for input(s): INR, PROTIME in the last 168 hours. Cardiac Enzymes: No results for input(s): CKTOTAL, CKMB, CKMBINDEX, TROPONINI in the last 168 hours. BNP (last 3 results) No results for input(s): PROBNP in the last 8760 hours. HbA1C: No results for input(s): HGBA1C in the last 72 hours. CBG: No results for input(s): GLUCAP in the last 168 hours. Lipid Profile: No results for input(s): CHOL, HDL, LDLCALC, TRIG, CHOLHDL, LDLDIRECT in the last 72 hours. Thyroid Function Tests: No results for input(s): TSH, T4TOTAL, FREET4, T3FREE, THYROIDAB in the last 72 hours. Anemia Panel: No results for input(s): VITAMINB12, FOLATE, FERRITIN, TIBC, IRON, RETICCTPCT in the last 72 hours. Urine analysis:    Component Value Date/Time   COLORURINE YELLOW 12/16/2020 [place] 12/16/2020 1434   LABSPEC 1.011 12/16/2020 1434   PHURINE 5.0 12/16/2020 1434   GLUCOSEU NEGATIVE 12/16/2020 1434   HGBUR MODERATE (A) 12/16/2020 [place] 12/16/2020 1434   [place] 12/16/2020 1434   PROTEINUR NEGATIVE 12/16/2020 1434   UROBILINOGEN 0.2 09/27/2011 1317   NITRITE NEGATIVE 12/16/2020 1434   LEUKOCYTESUR NEGATIVE 12/16/2020 1434    Radiological Exams on Admission: No results found.  EKG: Independently reviewed.  Rate controlled a flutter  on telemetry monitor.  Assessment/Plan Principal Problem:   Fluid overload Active Problems:   DM (diabetes mellitus) ([place]) with renal complications without long term use of insulin   Benign essential HTN   CKD (chronic kidney disease), stage IV ([facility])   Atrial flutter ([facility])   Acquired thrombophilia ([place])     1.  Fluid overload, acute on chronic diastolic dysfunction due to decreased renal clearance: CKD stage V. Agree with admission to cardiac monitor unit given severity of symptoms. Discussed with nephrology, seen in consultation.  High-dose diuretics challenge with Lasix 80 mg 3 times a day.  Intake and output monitoring.  Daily weight.  Urine output monitoring.  If no adequate response, may need hemodialysis. Echocardiogram with normal ejection fraction, diastolic dysfunction.  2.  CKD stage V: With progressive kidney disease and fluid overload.  Followed by nephrology.  Continue Renvela.  Possible dialysis.  3.  Anemia of chronic disease: Given iron supplements.  4.  Paroxysmal atrial flutter: Rate controlled.  Resume metoprolol.  Therapeutic on Eliquis.  5.  Type 2 diabetes: Diet controlled.  Not on treatment.  Blood sugars stable.    DVT prophylaxis: Eliquis Code Status: Full code Family Communication: None.  Patient is communicating with brother. Disposition Plan: Home Consults called: Nephrology, cardiology Admission status: Inpatient.  Cardiac telemetry.  Patient has significant fluid overload and advanced kidney disease, he needs intravenous diuresis and close monitoring in the hospital with ongoing multiple specialty consult and titration of medications and possible monitoring for hemodialysis.  Anticipate hospital stay more than 2 midnights.   Barb Merino MD [facility] Pager [phone]

## 2021-01-22 ENCOUNTER — Other Ambulatory Visit: Payer: Self-pay

## 2021-01-22 ENCOUNTER — Inpatient Hospital Stay (HOSPITAL_COMMUNITY): Payer: 59

## 2021-01-22 DIAGNOSIS — N184 Chronic kidney disease, stage 4 (severe): Secondary | ICD-10-CM

## 2021-01-22 DIAGNOSIS — I4892 Unspecified atrial flutter: Secondary | ICD-10-CM | POA: Diagnosis not present

## 2021-01-22 DIAGNOSIS — E119 Type 2 diabetes mellitus without complications: Secondary | ICD-10-CM

## 2021-01-22 DIAGNOSIS — I484 Atypical atrial flutter: Secondary | ICD-10-CM | POA: Diagnosis not present

## 2021-01-22 DIAGNOSIS — E8779 Other fluid overload: Secondary | ICD-10-CM | POA: Diagnosis not present

## 2021-01-22 DIAGNOSIS — I5033 Acute on chronic diastolic (congestive) heart failure: Secondary | ICD-10-CM | POA: Diagnosis not present

## 2021-01-22 LAB — CBC
HCT: 25 % — ABNORMAL LOW (ref 39.0–52.0)
Hemoglobin: 7.9 g/dL — ABNORMAL LOW (ref 13.0–17.0)
MCH: 31.6 pg (ref 26.0–34.0)
MCHC: 31.6 g/dL (ref 30.0–36.0)
MCV: 100 fL (ref 80.0–100.0)
Platelets: 208 10*3/uL (ref 150–400)
RBC: 2.5 MIL/uL — ABNORMAL LOW (ref 4.22–5.81)
RDW: 14.9 % (ref 11.5–15.5)
WBC: 17 10*3/uL — ABNORMAL HIGH (ref 4.0–10.5)
nRBC: 0 % (ref 0.0–0.2)

## 2021-01-22 LAB — RENAL FUNCTION PANEL
Albumin: 2.6 g/dL — ABNORMAL LOW (ref 3.5–5.0)
Anion gap: 16 — ABNORMAL HIGH (ref 5–15)
BUN: 113 mg/dL — ABNORMAL HIGH (ref 8–23)
CO2: 21 mmol/L — ABNORMAL LOW (ref 22–32)
Calcium: 11.5 mg/dL — ABNORMAL HIGH (ref 8.9–10.3)
Chloride: 99 mmol/L (ref 98–111)
Creatinine, Ser: 5.59 mg/dL — ABNORMAL HIGH (ref 0.61–1.24)
GFR, Estimated: 11 mL/min — ABNORMAL LOW (ref >60)
Glucose, Bld: 99 mg/dL (ref 70–99)
Phosphorus: 8 mg/dL — ABNORMAL HIGH (ref 2.5–4.6)
Potassium: 3.7 mmol/L (ref 3.5–5.1)
Sodium: 136 mmol/L (ref 135–145)

## 2021-01-22 LAB — URINALYSIS, ROUTINE W REFLEX MICROSCOPIC
Bilirubin Urine: NEGATIVE
Glucose, UA: NEGATIVE mg/dL
Ketones, ur: NEGATIVE mg/dL
Nitrite: NEGATIVE
Protein, ur: 30 mg/dL — AB
Specific Gravity, Urine: 1.01 (ref 1.005–1.030)
WBC, UA: >50 WBC/hpf — ABNORMAL HIGH (ref 0–5)
pH: 6 (ref 5.0–8.0)

## 2021-01-22 MED ORDER — DARBEPOETIN ALFA 150 MCG/0.3ML IJ SOSY
150.0000 ug | PREFILLED_SYRINGE | INTRAMUSCULAR | Status: DC
  Administered 2021-01-22: 150 ug via SUBCUTANEOUS
  Filled 2021-01-22: qty 0.3

## 2021-01-22 MED ORDER — DICLOFENAC SODIUM 1 % EX GEL
2.0000 g | Freq: Four times a day (QID) | CUTANEOUS | Status: DC
  Administered 2021-01-22 – 2021-02-05 (×53): 2 g via TOPICAL
  Filled 2021-01-22 (×2): qty 100

## 2021-01-22 MED ORDER — FUROSEMIDE 10 MG/ML IJ SOLN
80.0000 mg | Freq: Two times a day (BID) | INTRAMUSCULAR | Status: DC
  Administered 2021-01-22 – 2021-01-23 (×3): 80 mg via INTRAVENOUS
  Filled 2021-01-22 (×2): qty 8

## 2021-01-22 MED ORDER — TRAMADOL HCL 50 MG PO TABS
50.0000 mg | ORAL_TABLET | Freq: Four times a day (QID) | ORAL | Status: DC | PRN
  Administered 2021-01-22 – 2021-01-26 (×5): 50 mg via ORAL
  Filled 2021-01-22 (×5): qty 1

## 2021-01-22 MED ORDER — SODIUM CHLORIDE 0.9 % IV SOLN: 510.0000 mg | INTRAVENOUS | Status: DC

## 2021-01-22 MED ORDER — FERRIC CITRATE 1 GM 210 MG(FE) PO TABS
420.0000 mg | ORAL_TABLET | Freq: Three times a day (TID) | ORAL | Status: DC
  Administered 2021-01-22 – 2021-01-24 (×6): 420 mg via ORAL
  Filled 2021-01-22 (×6): qty 2

## 2021-01-22 NOTE — Progress Notes (Signed)
PROGRESS NOTE    Mark [name]  [id number] DOB: Mar 15, 1961 DOA: 01/16/2021 PCP: Delrae Rend, MD    No chief complaint on file.   Brief Narrative:  Mark [name] is a 60 y.o. male with medical history significant of recurrent left hip dislocation,hypertension, hyperlipidemia, obesity, diet-controlled diabetes ,chronic kidney disease stage V with baseline creatinine of 5, left arm fistula in anticipation of hemodialysis, chronic diastolic CHF, atrial flutter who was brought to elective cardioversion today, found to have significant fluid overload and needing admission.    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 of insulin   Benign essential HTN   CKD (chronic kidney disease), stage IV (HCC)   Atrial flutter (HCC)   Acquired thrombophilia (HCC)   Acute on chronic diastolic CHF exacerbation -Received 2 dose of IV Lasix with good response, is continued on Lasix twice daily today -Management per nephrology and cardiology  CKD 5 Does not have acute indication for dialysis Nephrology following  A flutter status post DCCV yesterday, back to aflutter,on metoprolol and Eliquis Cardiology following  Chronic hypotension On midodrine chronically 50 mg 3 times daily  BPH Continue flomax Recent h/o urinary retention while in the hospital in 12/2020 , good urine output this time since admission, will monitor   Leukocytosis Does not appear to be septic, will check UA Chest x-ray no acute infiltrate Repeat CBC  Left shoulder pain, report ongoing for a month We will get left shoulder x-ray  obesity: Body mass index is 35.48 kg/m.Marland Kitchen      Unresulted Labs  (From admission, onward)          Start     Ordered   01/22/21 [id number]  Urinalysis, Routine w reflex microscopic  Once,   R        01/22/21 0841   01/22/21 0842  Culture, Urine  Once,   R        01/22/21 0841            DVT prophylaxis:  apixaban (ELIQUIS) tablet 5 mg   Code Status: Full Family Communication: Patient Disposition:   Status is: Inpatient  Dispo: The patient is from: Home              Anticipated d/c is to: Home              Anticipated d/c date is: 2 to 3 days, on IV Lasix                Consultants:   Cardiology  Nephrology  Procedures:   DCCV prior to admission  Antimicrobials:    Anti-infectives (From admission, onward)   None         Objective: Vitals:   01/22/21 0500 01/22/21 0736 01/22/21 1137 01/22/21 1631  BP:  118/66 102/70 119/76  Pulse:  88 (!) 119 (!) 107  Resp:  '20 17 18  '$ Temp:  98 F (36.7 C) 98 F (36.7 C) 98.1 F (36.7 C)  TempSrc:   Oral Oral  SpO2:  99% 100% 100%  Weight: 109 kg     Height:        Intake/Output Summary (Last 24 hours) at 01/22/2021 1643 Last  data filed at 01/22/2021 1414 Gross per 24 hour  Intake 240 ml  Output 3025 ml  Net -2785 ml   Filed Weights   01/20/2021 1648 01/22/21 0019 01/22/21 0500  Weight: 110.4 kg 109 kg 109 kg    Examination:  General exam: calm, NAD Respiratory system: Clear to auscultation. Respiratory effort normal. Cardiovascular system: S1 & S2 heard, aflutter, + pedal edema. Gastrointestinal system: Abdomen is nondistended, soft and nontender. . Normal bowel sounds heard. Central nervous system: Alert and oriented. No focal neurological deficits. Extremities: left shoulder limited range of motion due to pain, bilateral lower extremity pitting edema Skin: No rashes, lesions or ulcers Psychiatry: Judgement and insight appear normal. Mood & affect appropriate.     Data Reviewed: I have personally reviewed following labs and imaging studies  CBC: Recent Labs  Lab  01/16/21 1241 01/22/21 0336  WBC 10.4 17.0*  HGB 8.4* 7.9*  HCT 25.7* 25.0*  MCV 97 100.0  PLT 238 [id number]    Basic Metabolic Panel: Recent Labs  Lab 01/16/21 1241 02/01/2021 1703 01/22/21 0336  NA 139 137 136  K 4.4 3.6 3.7  CL 101 99 99  CO2 17* 23 21*  GLUCOSE 134* 103* 99  BUN 105* 106* 113*  CREATININE 5.73* 5.41* 5.59*  CALCIUM 11.2* 11.4* 11.5*  MG  --  2.2  --   PHOS  --  8.2* 8.0*    GFR: Estimated Creatinine Clearance: 16.9 mL/min (A) (by C-G formula based on SCr of 5.59 mg/dL (H)).  Liver Function Tests: Recent Labs  Lab 02/04/2021 1703 01/22/21 0336  ALBUMIN 2.7* 2.6*    CBG: No results for input(s): GLUCAP in the last 168 hours.   Recent Results (from the past 240 hour(s))  SARS CORONAVIRUS 2 (TAT 6-24 HRS) Nasopharyngeal Nasopharyngeal Swab     Status: None   Collection Time: 01/19/21 12:21 PM   Specimen: Nasopharyngeal Swab  Result Value Ref Range Status   SARS Coronavirus 2 NEGATIVE NEGATIVE Final    Comment: (NOTE) SARS-CoV-2 target nucleic acids are NOT DETECTED.  The SARS-CoV-2 RNA is generally detectable in upper and lower respiratory specimens during the acute phase of infection. Negative results do not preclude SARS-CoV-2 infection, do not rule out co-infections with other pathogens, and should not be used as the sole basis for treatment or other patient management decisions. Negative results must be combined with clinical observations, patient history, and epidemiological information. The expected result is Negative.  Fact Sheet for Patients: SugarRoll.be  Fact Sheet for Healthcare Providers: https://www.woods-mathews.com/  This test is not yet approved or cleared by the [place] FDA and  has been authorized for detection and/or diagnosis of SARS-CoV-2 by FDA under an Emergency Use Authorization (EUA). This EUA will remain  in effect (meaning this test can be used) for the duration of  the COVID-19 declaration under Se ction 564(b)(1) of the Act, 21 U.S.C. section 360bbb-3(b)(1), unless the authorization is terminated or revoked sooner.  Performed at [facility], [place] [address], [place], [place] [address]          Radiology Studies: DG Chest 2 View  Result Date: 01/22/2021 CLINICAL DATA:  Pleuritic chest pain.  Shortness of breath. EXAM: CHEST - 2 VIEW COMPARISON:  Chest x-ray dated December 14, 2020. FINDINGS: Cardiomediastinal silhouette remains enlarged. Normal pulmonary vascularity. Unchanged streaky linear atelectasis and/or scarring in the lingula and both lower lobes. No focal consolidation, pleural effusion, or pneumothorax. No acute osseous abnormality. IMPRESSION: 1. No acute cardiopulmonary  disease. Electronically Signed   By: Titus Dubin M.D.   On: 01/22/2021 13:59        Scheduled Meds: . apixaban  5 mg Oral BID  . darbepoetin (ARANESP) injection - NON-DIALYSIS  150 mcg Subcutaneous Q Thu-1800  . diclofenac Sodium  2 g Topical QID  . ferric citrate  420 mg Oral TID WC  . furosemide  80 mg Intravenous BID  . loratadine  10 mg Oral Daily  . metoprolol succinate  12.5 mg Oral Daily  . midodrine  15 mg Oral TID WC  . multivitamin with minerals  1 tablet Oral QPC breakfast  . tamsulosin  0.4 mg Oral QPC supper  . vitamin B-12  1,000 mcg Oral Daily   Continuous Infusions: . ferric gluconate (FERRLECIT/NULECIT) IV 250 mg (01/10/2021 1806)     LOS: 1 day   Time spent: 49mns Greater than 50% of this time was spent in counseling, explanation of diagnosis, planning of further management, and coordination of care.   Voice Recognition /Viviann Sparedictation system was used to create this note, attempts have been made to correct errors. Please contact the author with questions and/or clarifications.   FFlorencia Reasons MD PhD FACP [facility]  Available via Epic secure chat 7am-7pm for nonurgent issues Please page for urgent issues To page  the attending provider between 7A-7P or the covering provider during after hours 7P-7A, please log into the web site [email or web address] and access using universal [place] password for that web site. If you do not have the password, please call the hospital operator.    01/22/2021, 4:43 PM

## 2021-01-22 NOTE — Anesthesia Postprocedure Evaluation (Signed)
Anesthesia Post Note  Patient: Mark [name]  Procedure(s) Performed: CARDIOVERSION (N/A )     Patient location during evaluation: Endoscopy Anesthesia Type: General Level of consciousness: awake and alert Pain management: pain level controlled Vital Signs Assessment: post-procedure vital signs reviewed and stable Respiratory status: spontaneous breathing, nonlabored ventilation, respiratory function stable and patient connected to nasal cannula oxygen Cardiovascular status: blood pressure returned to baseline and stable Postop Assessment: no apparent nausea or vomiting Anesthetic complications: no   No complications documented.  Last Vitals:  Vitals:   01/22/21 0417 01/22/21 0736  BP: 91/61 118/66  Pulse: 100 88  Resp: 20 20  Temp: 37.5 C   SpO2: 98% 99%    Last Pain:  Vitals:   01/22/21 0736  TempSrc:   PainSc: [place]

## 2021-01-22 NOTE — Progress Notes (Addendum)
Patient ID: Mark [name], male   DOB: 1961-10-12, 60 y.o.   MRN: [id number] [facility] Progress Note   Assessment/ Plan:   1.  Acute exacerbation of diastolic heart failure: With increased pedal edema/exertional dyspnea noted yesterday for which he was admitted for intravenous diuresis.  Overnight, with decent response to furosemide with corresponding improvement of shortness of breath.  Decrease furosemide to 80 mg IV twice daily given rising BUN/creatinine and continue to follow daily weights/volume assessment. 2.  Chronic kidney disease stage V: Recent labs likely reflective of progression versus acute kidney injury of cardiorenal nature.  BUN/creatinine slightly higher today and likely reflective of recent increase in diuretic dose; will reduce furosemide to 80 mg IV twice daily and continue to follow daily weights/physical exam.  He does not have any acute indications for dialysis at this time and has a mature left RCF in place if needed. 3.  Hypertension: Hold antihypertensive therapy at this time with increasing diuretics and earlier noted hypotension. 4.  Anemia of chronic disease: With significant iron deficiency/anemia, ordered Nulecit and Aranesp. 5.  Secondary hyperparathyroidism: With elevated calcium level and significant hyperphosphatemia, begin Auryxia for phosphorus binding 3 times a day with meals.  Subjective:   Reports some intermittent left shoulder pain that did not improve with Tylenol   Objective:   BP 118/66   Pulse 88   Temp 98 F (36.7 C)   Resp 20   Ht '5\' 9"'$  (1.753 m)   Wt 109 kg   SpO2 99%   BMI 35.48 kg/m   Intake/Output Summary (Last 24 hours) at 01/22/2021 0855 Last data filed at 01/22/2021 0700 Gross per 24 hour  Intake 560 ml  Output 2050 ml  Net -1490 ml   Weight change:   Physical Exam: Gen: Appears uncomfortable sitting up on the edge of his bed CVS: Pulse regular rhythm, normal rate, S1 and S2 normal Resp: Clear to  auscultation bilaterally, no distinct rales or rhonchi Abd: Soft, moderate global distention, nontender Ext: 2-3+ pitting lower extremity edema  Imaging: No results found.  Labs: BMET Recent Labs  Lab 01/16/21 1241 01/28/2021 1703 01/22/21 0336  NA 139 137 136  K 4.4 3.6 3.7  CL 101 99 99  CO2 17* 23 21*  GLUCOSE 134* 103* 99  BUN 105* 106* 113*  CREATININE 5.73* 5.41* 5.59*  CALCIUM 11.2* 11.4* 11.5*  PHOS  --  8.2* 8.0*   CBC Recent Labs  Lab 01/16/21 1241 01/22/21 0336  WBC 10.4 17.0*  HGB 8.4* 7.9*  HCT 25.7* 25.0*  MCV 97 100.0  PLT 238 208    Medications:    . apixaban  5 mg Oral BID  . darbepoetin (ARANESP) injection - NON-DIALYSIS  150 mcg Subcutaneous Q Thu-1800  . diclofenac Sodium  2 g Topical QID  . ferrous sulfate  325 mg Oral Q breakfast  . furosemide  80 mg Intravenous TID  . loratadine  10 mg Oral Daily  . metoprolol succinate  12.5 mg Oral Daily  . midodrine  15 mg Oral TID WC  . multivitamin with minerals  1 tablet Oral QPC breakfast  . sevelamer carbonate  800 mg Oral TID WC  . tamsulosin  0.4 mg Oral QPC supper  . vitamin B-12  1,000 mcg Oral Daily   Elmarie Shiley, MD 01/22/2021, 8:55 AM

## 2021-01-22 NOTE — Progress Notes (Addendum)
Progress Note  Patient Name: Mark [name] Date of Encounter: 01/22/2021  [facility] Cardiologist: [name], MD   Subjective   Upon presentation for DCCV yesterday, with increase of CHF, he was cardioverted to what appeared to be sinus  Mobitz type 1, he was hypotensive requiring neo.  Admitted to [facility].   + chest pain with deep breath and with cough, no CXR will do today   Inpatient Medications    Scheduled Meds: . apixaban  5 mg Oral BID  . darbepoetin (ARANESP) injection - NON-DIALYSIS  150 mcg Subcutaneous Q Thu-1800  . diclofenac Sodium  2 g Topical QID  . ferric citrate  420 mg Oral TID WC  . furosemide  80 mg Intravenous BID  . loratadine  10 mg Oral Daily  . metoprolol succinate  12.5 mg Oral Daily  . midodrine  15 mg Oral TID WC  . multivitamin with minerals  1 tablet Oral QPC breakfast  . tamsulosin  0.4 mg Oral QPC supper  . vitamin B-12  1,000 mcg Oral Daily   Continuous Infusions: . ferric gluconate (FERRLECIT/NULECIT) IV 250 mg (01/08/2021 1806)   PRN Meds: acetaminophen **OR** acetaminophen, polyethylene glycol, traMADol   Vital Signs    Vitals:   01/22/21 0019 01/22/21 0417 01/22/21 0500 01/22/21 0736  BP: 100/69 91/61  118/66  Pulse: 65 100  88  Resp: '20 20  20  '$ Temp: 98.8 F (37.1 C) 99.5 F (37.5 C)  98 F (36.7 C)  TempSrc: Oral Oral    SpO2: 99% 98%  99%  Weight: 109 kg  109 kg   Height:        Intake/Output Summary (Last 24 hours) at 01/22/2021 [id number] Last data filed at 01/22/2021 0700 Gross per 24 hour  Intake 560 ml  Output 2050 ml  Net -1490 ml   Last 3 Weights 01/22/2021 01/22/2021 01/20/2021  Weight (lbs) 240 lb 4.5 oz 240 lb 4.8 oz 243 lb 6.2 oz  Weight (kg) 108.99 kg 108.999 kg 110.4 kg      Telemetry    atrial flutter rate controlled, went into about 9 pm yesterday   - Personally Reviewed  ECG    Atrial flutter rate controlled +PVCs - Personally Reviewed  Physical Exam   GEN: No acute distress.    Neck: No JVD Cardiac: irreg irreg, no murmurs, rubs, or gallops. Muffled heart sounds Respiratory: Clear to auscultation bilaterally. GI: Soft, nontender, non-distended  MS: No edema; No deformity. Neuro:  Nonfocal  Psych: Normal affect   Labs    High Sensitivity Troponin:  No results for input(s): TROPONINIHS in the last 720 hours.    Chemistry Recent Labs  Lab 01/16/21 1241 02/01/2021 1703 01/22/21 0336  NA 139 137 136  K 4.4 3.6 3.7  CL 101 99 99  CO2 17* 23 21*  GLUCOSE 134* 103* 99  BUN 105* 106* 113*  CREATININE 5.73* 5.41* 5.59*  CALCIUM 11.2* 11.4* 11.5*  ALBUMIN  --  2.7* 2.6*  GFRNONAA  --  11* 11*  ANIONGAP  --  15 16*     Hematology Recent Labs  Lab 01/16/21 1241 01/22/21 0336  WBC 10.4 17.0*  RBC 2.64* 2.50*  HGB 8.4* 7.9*  HCT 25.7* 25.0*  MCV 97 100.0  MCH 31.8 31.6  MCHC 32.7 31.6  RDW 14.5 14.9  PLT 238 208    BNPNo results for input(s): BNP, PROBNP in the last 168 hours.   DDimer No results for input(s):  DDIMER in the last 168 hours.   Radiology    No results found.  Cardiac Studies   Echo 10/2020 IMPRESSIONS    1. No subcostal views.  2. Left ventricular ejection fraction, by estimation, is 60 to 65%. The  left ventricle has normal function. The left ventricle has no regional  wall motion abnormalities. There is mild left ventricular hypertrophy.  Left ventricular diastolic parameters  are indeterminate.  3. Right ventricular systolic function is normal. The right ventricular  size is normal.  4. Left atrial size was severely dilated.  5. The mitral valve is normal in structure. Mild mitral valve  regurgitation. No evidence of mitral stenosis. Severe mitral annular  calcification.  6. The aortic valve is normal in structure. Aortic valve regurgitation is  trivial. Moderate aortic valve stenosis.   FINDINGS  Left Ventricle: Left ventricular ejection fraction, by estimation, is 60  to 65%. The left ventricle has  normal function. The left ventricle has no  regional wall motion abnormalities. Definity contrast agent was given IV  to delineate the left ventricular  endocardial borders. The left ventricular internal cavity size was normal  in size. There is mild left ventricular hypertrophy. Left ventricular  diastolic parameters are indeterminate.   Right Ventricle: The right ventricular size is normal. Right ventricular  systolic function is normal.   Left Atrium: Left atrial size was severely dilated.   Right Atrium: Right atrial size was normal in size.   Pericardium: There is no evidence of pericardial effusion.   Mitral Valve: The mitral valve is normal in structure. Severe mitral  annular calcification. Mild mitral valve regurgitation. No evidence of  mitral valve stenosis.   Tricuspid Valve: The tricuspid valve is normal in structure. Tricuspid  valve regurgitation is trivial. No evidence of tricuspid stenosis.   DCCV 01/12/2021 Procedure Details:  Consent: Risks of procedure as well as the alternatives and risks of each were explained to the (patient/caregiver).  Consent for procedure obtained.  Time Out: Verified patient identification, verified procedure, site/side was marked, verified correct patient position, special equipment/implants available, medications/allergies/relevent history reviewed, required imaging and test results available. PERFORMED.  Patient placed on cardiac monitor, pulse oximetry, supplemental oxygen as necessary.  Sedation given: propofol '140mg'$ ; lidocaine '40mg'$  administered by anesthesia Pacer pads placed anterior and posterior chest.  Cardioverted 1 time(s).  Cardioversion with synchronized biphasic 200J shock.  Evaluation: Findings: Post procedure EKG shows: Sinus with mobitz 1 AVB and PACs Complications: None  After the procedure, the patient was hypotensive requiring pressor support. Was also notably overloaded on physical exam. Given tenuous  status, decision was made to admit to in-patient for pressor support and IV diuresis. Plan discussed with admitting physician, Dr. Ellyn Hack and primary cardiologist Dr. Margaretann Loveless.   Patient Profile     60 y.o. male with a history of moderate AS, CKD V, HTN, gout, Persistent a flutter, DM-2, and hx of hip dislocations and admitted 12/14/20 with acute on chronic diastolic CHF, acute respiratory failure and found to be in atrial flutter, and arrived yesterday for DCCV, he has not missed eliquis.   In CHF and post DCCV with hypotension requiring neo. Admitted to [facility].  Assessment & Plan    Atrial flutter - DCCV yesterday with mobitz 1 and PACs and PVCs. Back in atrial flutter at 9 pm last evening. Is on toprol XL 12.5 mg  Acute on chronic diastolic CHF  -neg 0000000 and wt down from 110.4 to 109 Kg.   -lasix per Renal  Pleuritic chest pain- will check 2V CXR    anticoagulation on eliquis, had not missed any doses  Hypotension was on neo now resolved BP still soft 91/61 to 118/66  -on toprl XL 12.5 mg daily  -on chronic midodrine 15 mg TID.    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 Moderate AS.  CKD-V Nephrology has seen.  Lasix 80 mg IV BID, no acute indications for dialysis at this time and has a mature left RCF in place if needed. Per renal --Cr 5.59 BUN 113  Anemia of chronic disease, per Renal/IM -Hgb 7.9     For questions or updates, please contact [facility] Please consult [email or web address] for contact info under    Signed, Cecilie Kicks, NP  01/22/2021, 9:38 AM    The patient was seen, examined and discussed with Cecilie Kicks, NP and I agree with the above.   60 year old patient with known chronic diastolic CHF with CKD stage V, anemia and persistent atrial flutter was admitted for cardioversion and acute on chronic CHF, he underwent successful cardioversion yesterday unfortunately later yesterday went back to atrial flutter.  On physical exam he has elevated JVDs, crackles in both  lungs and +2 pitting lower extremity edema.  He has diuresed 1 and half liter overnight his creatinine is 5.6, baseline around 5, will continue Lasix 80 mg IV twice daily, will have nephrology follow him.  We will attempt cardioversion once he is euvolemic.  Ena Dawley, MD 01/22/2021

## 2021-01-23 ENCOUNTER — Encounter (HOSPITAL_COMMUNITY): Payer: Self-pay | Admitting: Cardiology

## 2021-01-23 DIAGNOSIS — I484 Atypical atrial flutter: Secondary | ICD-10-CM | POA: Diagnosis not present

## 2021-01-23 DIAGNOSIS — N184 Chronic kidney disease, stage 4 (severe): Secondary | ICD-10-CM | POA: Diagnosis not present

## 2021-01-23 DIAGNOSIS — E8779 Other fluid overload: Secondary | ICD-10-CM | POA: Diagnosis not present

## 2021-01-23 DIAGNOSIS — I5033 Acute on chronic diastolic (congestive) heart failure: Secondary | ICD-10-CM | POA: Diagnosis not present

## 2021-01-23 LAB — CBC WITH DIFFERENTIAL/PLATELET
Abs Immature Granulocytes: 0.13 10*3/uL — ABNORMAL HIGH (ref 0.00–0.07)
Basophils Absolute: 0 10*3/uL (ref 0.0–0.1)
Basophils Relative: 0 %
Eosinophils Absolute: 0 10*3/uL (ref 0.0–0.5)
Eosinophils Relative: 0 %
HCT: 23 % — ABNORMAL LOW (ref 39.0–52.0)
Hemoglobin: 7.4 g/dL — ABNORMAL LOW (ref 13.0–17.0)
Immature Granulocytes: 1 %
Lymphocytes Relative: 7 %
Lymphs Abs: 1.2 10*3/uL (ref 0.7–4.0)
MCH: 31.9 pg (ref 26.0–34.0)
MCHC: 32.2 g/dL (ref 30.0–36.0)
MCV: 99.1 fL (ref 80.0–100.0)
Monocytes Absolute: 1.3 10*3/uL — ABNORMAL HIGH (ref 0.1–1.0)
Monocytes Relative: 7 %
Neutro Abs: 15.3 10*3/uL — ABNORMAL HIGH (ref 1.7–7.7)
Neutrophils Relative %: 85 %
Platelets: 263 10*3/uL (ref 150–400)
RBC: 2.32 MIL/uL — ABNORMAL LOW (ref 4.22–5.81)
RDW: 14.9 % (ref 11.5–15.5)
WBC: 18 10*3/uL — ABNORMAL HIGH (ref 4.0–10.5)
nRBC: 0 % (ref 0.0–0.2)

## 2021-01-23 LAB — BASIC METABOLIC PANEL
Anion gap: 15 (ref 5–15)
BUN: 125 mg/dL — ABNORMAL HIGH (ref 8–23)
CO2: 21 mmol/L — ABNORMAL LOW (ref 22–32)
Calcium: 11.6 mg/dL — ABNORMAL HIGH (ref 8.9–10.3)
Chloride: 96 mmol/L — ABNORMAL LOW (ref 98–111)
Creatinine, Ser: 5.47 mg/dL — ABNORMAL HIGH (ref 0.61–1.24)
GFR, Estimated: 11 mL/min — ABNORMAL LOW (ref >60)
Glucose, Bld: 184 mg/dL — ABNORMAL HIGH (ref 70–99)
Potassium: 4 mmol/L (ref 3.5–5.1)
Sodium: 132 mmol/L — ABNORMAL LOW (ref 135–145)

## 2021-01-23 LAB — SEDIMENTATION RATE: Sed Rate: >140 mm/hr — ABNORMAL HIGH (ref 0–16)

## 2021-01-23 LAB — C-REACTIVE PROTEIN: CRP: 29.1 mg/dL — ABNORMAL HIGH (ref <1.0)

## 2021-01-23 LAB — LACTIC ACID, PLASMA: Lactic Acid, Venous: 1.2 mmol/L (ref 0.5–1.9)

## 2021-01-23 MED ORDER — MUSCLE RUB 10-15 % EX CREA
TOPICAL_CREAM | CUTANEOUS | Status: DC | PRN
  Filled 2021-01-23: qty 85

## 2021-01-23 MED ORDER — FUROSEMIDE 80 MG PO TABS
80.0000 mg | ORAL_TABLET | Freq: Two times a day (BID) | ORAL | Status: DC
  Administered 2021-01-23 – 2021-01-24 (×2): 80 mg via ORAL
  Filled 2021-01-23 (×2): qty 1

## 2021-01-23 NOTE — Progress Notes (Addendum)
Progress Note  Patient Name: Mark [name] Date of Encounter: 01/23/2021  Primary Cardiologist: [name], MD   Subjective   No complaints today. Remains volume overloaded today   Inpatient Medications    Scheduled Meds: . apixaban  5 mg Oral BID  . darbepoetin (ARANESP) injection - NON-DIALYSIS  150 mcg Subcutaneous Q Thu-1800  . diclofenac Sodium  2 g Topical QID  . ferric citrate  420 mg Oral TID WC  . furosemide  80 mg Intravenous BID  . loratadine  10 mg Oral Daily  . metoprolol succinate  12.5 mg Oral Daily  . midodrine  15 mg Oral TID WC  . multivitamin with minerals  1 tablet Oral QPC breakfast  . tamsulosin  0.4 mg Oral QPC supper  . vitamin B-12  1,000 mcg Oral Daily   Continuous Infusions: . ferric gluconate (FERRLECIT/NULECIT) IV 250 mg (01/25/2021 1806)   PRN Meds: acetaminophen **OR** acetaminophen, polyethylene glycol, traMADol   Vital Signs    Vitals:   01/22/21 1631 01/22/21 1928 01/23/21 0413 01/23/21 0751  BP: 119/76 (!) 117/91 (!) 124/97 103/63  Pulse: (!) 107 99 (!) 41 81  Resp: '18 20 18 18  '$ Temp: 98.1 F (36.7 C) 98.9 F (37.2 C) 98.7 F (37.1 C) 98.3 F (36.8 C)  TempSrc: Oral Oral Oral Oral  SpO2: 100% 100% 95% 96%  Weight:   109.2 kg   Height:        Intake/Output Summary (Last 24 hours) at 01/23/2021 0945 Last data filed at 01/23/2021 0400 Gross per 24 hour  Intake 240 ml  Output 1500 ml  Net -1260 ml   Filed Weights   01/22/21 0019 01/22/21 0500 01/23/21 0413  Weight: 109 kg 109 kg 109.2 kg    Physical Exam   General: Obese, NAD Neck: Negative for carotid bruits. No JVD Lungs:Clear to ausculation bilaterally. No wheezes, rales, or rhonchi. Breathing is unlabored. Cardiovascular: Irregularly irregular. No murmurs Abdomen: Soft, non-tender, distended. No obvious abdominal masses. Extremities: 2-3 + BLE edema. Radial  pulses 2+ bilaterally Neuro: Alert and oriented. No focal deficits. No facial asymmetry. MAE  spontaneously. Psych: Responds to questions appropriately with normal affect.    Labs    Chemistry Recent Labs  Lab 01/16/21 1241 02/03/2021 1703 01/22/21 0336  NA 139 137 136  K 4.4 3.6 3.7  CL 101 99 99  CO2 17* 23 21*  GLUCOSE 134* 103* 99  BUN 105* 106* 113*  CREATININE 5.73* 5.41* 5.59*  CALCIUM 11.2* 11.4* 11.5*  ALBUMIN  --  2.7* 2.6*  GFRNONAA  --  11* 11*  ANIONGAP  --  15 16*     Hematology Recent Labs  Lab 01/16/21 1241 01/22/21 0336  WBC 10.4 17.0*  RBC 2.64* 2.50*  HGB 8.4* 7.9*  HCT 25.7* 25.0*  MCV 97 100.0  MCH 31.8 31.6  MCHC 32.7 31.6  RDW 14.5 14.9  PLT 238 208    Cardiac EnzymesNo results for input(s): TROPONINI in the last 168 hours. No results for input(s): TROPIPOC in the last 168 hours.   BNPNo results for input(s): BNP, PROBNP in the last 168 hours.   DDimer No results for input(s): DDIMER in the last 168 hours.   Radiology    DG Chest 2 View  Result Date: 01/22/2021 CLINICAL DATA:  Pleuritic chest pain.  Shortness of breath. EXAM: CHEST - 2 VIEW COMPARISON:  Chest x-ray dated December 14, 2020. FINDINGS: Cardiomediastinal silhouette remains enlarged. Normal pulmonary vascularity.  Unchanged streaky linear atelectasis and/or scarring in the lingula and both lower lobes. No focal consolidation, pleural effusion, or pneumothorax. No acute osseous abnormality. IMPRESSION: 1. No acute cardiopulmonary disease. Electronically Signed   By: Titus Dubin M.D.   On: 01/22/2021 13:59   DG Shoulder Left  Result Date: 01/22/2021 CLINICAL DATA:  Acute left shoulder pain. EXAM: LEFT SHOULDER - 2+ VIEW COMPARISON:  None. FINDINGS: There is no evidence of fracture or dislocation. There is no evidence of arthropathy or other focal bone abnormality. Soft tissues are unremarkable. IMPRESSION: Negative. Electronically Signed   By: Marijo Conception M.D.   On: 01/22/2021 18:53   Telemetry    01/23/21 AF with rates in the 80-90s Personally Reviewed  ECG     No new tracing as of 01/23/21- Personally Reviewed  Cardiac Studies   Echo 10/2020  1. No subcostal views.  2. Left ventricular ejection fraction, by estimation, is 60 to 65%. The  left ventricle has normal function. The left ventricle has no regional  wall motion abnormalities. There is mild left ventricular hypertrophy.  Left ventricular diastolic parameters  are indeterminate.  3. Right ventricular systolic function is normal. The right ventricular  size is normal.  4. Left atrial size was severely dilated.  5. The mitral valve is normal in structure. Mild mitral valve  regurgitation. No evidence of mitral stenosis. Severe mitral annular  calcification.  6. The aortic valve is normal in structure. Aortic valve regurgitation is  trivial. Moderate aortic valve stenosis.     DCCV 01/28/2021 Evaluation: Findings: Post procedure EKG shows:Sinus with mobitz 1 AVB and PACs Complications:None  After the procedure, the patient was hypotensive requiring pressor support. Was also notably overloaded on physical exam. Given tenuous status, decision was made to admit to in-patient for pressor support and IV diuresis. Plan discussed with admitting physician, Dr. Ellyn Hack and primary cardiologist Dr. Margaretann Loveless.  Patient Profile     60 y.o. male with a history of moderate AS, CKD V, HTN, gout, persistent atrial flutter, DM2, and hx of hip dislocations and recent acute on chronic diastolic CHF with acute respiratory failure>> found to be in atrial flutter. Plan was for OP DCCV>>scheduled for 01/22/21. On arrival pt found to be severely volume overloaded.  Assessment & Plan    1. Atrial flutter: -Remains in AF with rate control in the 90-100 -Plan for DCCV once he is more euvolemic  -Continue Toprol XL 12.5>>unable to up titrate due to BP   2. Acute on chronic CHF: -Remains fluid volume overloaded on exam -Nephrology transitioned to PO diuretic dosing later today  -Weight,  -I&O, net  neg 2.7 -Creatinine, 5.59 today>>was 5.41 yesterday   3. Pleuritic chest pain: -Denies recurrence today   4. Hypotension: -Post cardioversion requiring vasopressors for support which has since stabilized -Continue current regimen with Toprol, chronic midodrine   5. Moderate AS: -On echo with mean gradient at 43mHg   6. CKD stage V: -Cr, 5.59 today>>up from 5.41 yesterday  -Followed by nephrology  -IV Lasix transitioned to PO for evening dosing   Signed, JKathyrn DrownNP-C HColdspringPager: [phone]/18/2022, 9:45 AM     For questions or updates, please contact   Please consult [email or web address] for contact info under Cardiology/STEMI.  The patient was seen, examined and discussed with JKathyrn Drown NP  and I agree with the above.   60year old patient with known chronic diastolic CHF with CKD stage V, anemia and persistent atrial flutter was admitted for cardioversion and  acute on chronic CHF, he underwent successful cardioversion yesterday unfortunately later yesterday went back to atrial flutter.  On physical exam he has elevated JVDs, crackles in both lungs and +2 pitting lower extremity edema.  He has diuresed 2.6 L in the last 2 days, his creatinine is 5.47, BUN up to 125, neprhology recommended switching to Lasix 80 mg PO BID starting tonight, we will order compression socks, DCCV on Monday.   Ena Dawley, MD 01/23/2021

## 2021-01-23 NOTE — Progress Notes (Addendum)
[facility] NEPHROLOGY PROGRESS NOTE  Assessment/ Plan: Pt is a 60 y.o. yo male  With PMH of HTN, DM, HLD, obesity, CKD stage V baseline creatinine level around 5 admitted with acute diastolic CHF.  #Acute exacerbation of diastolic heart failure:  Responded with IV diuretics.  Currently on room air and clear CXR.  Creatinine level is overall stable however BUN level mildly gone up.  He had his IV Lasix this morning with plan to switch to p.o. starting from today evening.  I will increase the home dose of Lasix to 80 mg twice daily (pt say he was taking lasix 40 mg bid only). Not uremic and no need for dialysis at this time. He has a left radiocephalic fistula that was created in October, 2017 by Dr. Scot Dock that is mature. Follow with Dr Posey Pronto at [facility].  # Chronic kidney disease stage V: Likely progressive CKD.  Not responded with IV diuretics.  Increased home diuretics dose.  Not uremic currently.  He will follow with Dr. P[facility]y [facility]onto at [facility].  # Hypertension: On metoprolol and diuretics.  BP acceptable.  # Anemia of chronic disease: Iron deficiency therefore ordered IV iron and ESA on 3/17.  He will need outpatient ESA treatment.  # Secondary hyperparathyroidism: Both calcium and phosphorus level elevated.  Hold any vitamin D.  Continue with Renvela or any noncalcium based binders.     Subjective: Seen and examined.  Reports shortness of breath is much better.  Denies chest pain.  Urine output 1.5 L.  Denies nausea, vomiting, dysphagia. Objective Vital signs in last 24 hours: Vitals:   01/22/21 1631 01/22/21 1928 01/23/21 0413 01/23/21 0751  BP: 119/76 (!) 117/91 (!) 124/97 103/63  Pulse: (!) 107 99 (!) 41 81  Resp: '18 20 18 18  '$ Temp: 98.1 F (36.7 C) 98.9 F (37.2 C) 98.7 F (37.1 C) 98.3 F (36.8 C)  TempSrc: Oral Oral Oral Oral  SpO2: 100% 100% 95% 96%  Weight:   109.2 kg   Height:       Weight change: -2.585 kg  Intake/Output Summary (Last 24 hours) at 01/23/2021  0946 Last data filed at 01/23/2021 0400 Gross per 24 hour  Intake 240 ml  Output 1500 ml  Net -1260 ml       Labs: Basic Metabolic Panel: Recent Labs  Lab 01/16/21 1241 01/28/2021 1703 01/22/21 0336  NA 139 137 136  K 4.4 3.6 3.7  CL 101 99 99  CO2 17* 23 21*  GLUCOSE 134* 103* 99  BUN 105* 106* 113*  CREATININE 5.73* 5.41* 5.59*  CALCIUM 11.2* 11.4* 11.5*  PHOS  --  8.2* 8.0*   Liver Function Tests: Recent Labs  Lab 01/20/2021 1703 01/22/21 0336  ALBUMIN 2.7* 2.6*   No results for input(s): LIPASE, AMYLASE in the last 168 hours. No results for input(s): AMMONIA in the last 168 hours. CBC: Recent Labs  Lab 01/16/21 1241 01/22/21 0336  WBC 10.4 17.0*  HGB 8.4* 7.9*  HCT 25.7* 25.0*  MCV 97 100.0  PLT 238 208   Cardiac Enzymes: No results for input(s): CKTOTAL, CKMB, CKMBINDEX, TROPONINI in the last 168 hours. CBG: No results for input(s): GLUCAP in the last 168 hours.  Iron Studies:  Recent Labs    01/27/2021 1703  IRON 13*  TIBC 204*  FERRITIN 753*   Studies/Results: DG Chest 2 View  Result Date: 01/22/2021 CLINICAL DATA:  Pleuritic chest pain.  Shortness of breath. EXAM: CHEST - 2 VIEW COMPARISON:  Chest  x-ray dated December 14, 2020. FINDINGS: Cardiomediastinal silhouette remains enlarged. Normal pulmonary vascularity. Unchanged streaky linear atelectasis and/or scarring in the lingula and both lower lobes. No focal consolidation, pleural effusion, or pneumothorax. No acute osseous abnormality. IMPRESSION: 1. No acute cardiopulmonary disease. Electronically Signed   By: Titus Dubin M.D.   On: 01/22/2021 13:59   DG Shoulder Left  Result Date: 01/22/2021 CLINICAL DATA:  Acute left shoulder pain. EXAM: LEFT SHOULDER - 2+ VIEW COMPARISON:  None. FINDINGS: There is no evidence of fracture or dislocation. There is no evidence of arthropathy or other focal bone abnormality. Soft tissues are unremarkable. IMPRESSION: Negative. Electronically Signed   By:  Marijo Conception M.D.   On: 01/22/2021 18:53    Medications: Infusions: . ferric gluconate (FERRLECIT/NULECIT) IV 250 mg (01/20/2021 1806)    Scheduled Medications: . apixaban  5 mg Oral BID  . darbepoetin (ARANESP) injection - NON-DIALYSIS  150 mcg Subcutaneous Q Thu-1800  . diclofenac Sodium  2 g Topical QID  . ferric citrate  420 mg Oral TID WC  . furosemide  80 mg Intravenous BID  . loratadine  10 mg Oral Daily  . metoprolol succinate  12.5 mg Oral Daily  . midodrine  15 mg Oral TID WC  . multivitamin with minerals  1 tablet Oral QPC breakfast  . tamsulosin  0.4 mg Oral QPC supper  . vitamin B-12  1,000 mcg Oral Daily    have reviewed scheduled and prn medications.  Physical Exam: General:NAD, comfortable Heart:RRR, s1s2 nl Lungs:clear b/l, no crackle Abdomen:soft, Non-tender, non-distended Extremities:No edema Dialysis Access: AV fistula has good thrill and bruit  Rollin Kotowski Prasad Antwan Pandya 01/23/2021,9:46 AM  LOS: 2 days

## 2021-01-23 NOTE — Evaluation (Signed)
Physical Therapy Evaluation Patient Details Name: Mark [name] MRN: [id number] DOB: 22-Jan-1961 Today's Date: 01/23/2021   History of Present Illness  60 y.o. male was admitted with  onset of SOB and pleuritic pain,  and had cardioversion on 3/16.  Was hypotensive afterward, now more corrected.  Cleared on xrays of L shoulder and chest for any findings.  PMHx:  a-flutter, CKD5, HTN, gout, anemia, hyperparathyroidism, mitral regurg, aortic valve stenosis, atherosclerosis, CHF, EF 60-65%, DM, recurring L hip dislocation, SOB  Clinical Impression  Pt is up to walk with PT and noting his struggle to balance with just SPC.  Has more recently not needed an AD consistently and so will recommend his use of possibly RW and family help he will have.  Pt tends to use furniture or other external support, but with RW and supervision with HHPT will be safer.  Follow for acute PT goals and make time to climb 3 steps with RW when pt can tolerate.  Nursing in to see pt for skin wound which is bleeding lightly, pt is aware of the issue.      Follow Up Recommendations Home health PT;Supervision for mobility/OOB    Equipment Recommendations  None recommended by PT    Recommendations for Other Services       Precautions / Restrictions Precautions Precautions: Fall Precaution Comments: monitor O2 sats Restrictions Weight Bearing Restrictions: No Other Position/Activity Restrictions: Pt is having stiffness on L hip but sits comfortably      Mobility  Bed Mobility               General bed mobility comments: up in chair when PT arrived    Transfers Overall transfer level: Needs assistance Equipment used: Rolling walker (2 wheeled);1 person hand held assist Transfers: Sit to/from Stand Sit to Stand: Min guard            Ambulation/Gait Ambulation/Gait assistance: Min guard;Min assist Gait Distance (Feet): 45 Feet Assistive device: 1 person hand held assist;Straight cane Gait  Pattern/deviations: Step-through pattern;Wide base of support Gait velocity: reduced Gait velocity interpretation: <1.31 ft/sec, indicative of household ambulator General Gait Details: pt is walking through confined spaces, taking his time and controlling balance with min guard to min assist on cane  Stairs            Wheelchair Mobility    Modified Rankin (Stroke Patients Only)       Balance Overall balance assessment: Needs assistance Sitting-balance support: Feet supported Sitting balance-Leahy Scale: Good     Standing balance support: Bilateral upper extremity supported;During functional activity Standing balance-Leahy Scale: Poor                               Pertinent Vitals/Pain Pain Assessment: No/denies pain    Home Living Family/patient expects to be discharged to:: Private residence Living Arrangements: Alone Available Help at Discharge: Family;Available 24 hours/day (for as long as needed) Type of Home: House Home Access: Stairs to enter Entrance Stairs-Rails: None Entrance Stairs-Number of Steps: 3 Home Layout: One level Home Equipment: Grab bars - tub/shower;Shower seat;Cane - quad;Cane - single point;Walker - 2 wheels;Bedside commode Additional Comments: does not use equipment    Prior Function Level of Independence: Independent with assistive device(s)         Comments: uses the cane     Hand Dominance   Dominant Hand: Right    Extremity/Trunk Assessment   Upper Extremity Assessment Upper Extremity  Assessment: Overall WFL for tasks assessed    Lower Extremity Assessment Lower Extremity Assessment: Generalized weakness    Cervical / Trunk Assessment Cervical / Trunk Assessment: Normal  Communication   Communication: No difficulties  Cognition Arousal/Alertness: Awake/alert Behavior During Therapy: WFL for tasks assessed/performed Overall Cognitive Status: Within Functional Limits for tasks assessed                                  General Comments: able to give history accurately      General Comments General comments (skin integrity, edema, etc.): pt was seen for mobility on [facility] and has need for min guard to min assist to control steadiness, to avoid a Fall    Exercises     Assessment/Plan    PT Assessment Patient needs continued PT services  PT Problem List Decreased strength;Decreased balance;Decreased mobility;Decreased coordination       PT Treatment Interventions DME instruction;Gait training;Stair training;Functional mobility training;Therapeutic activities;Therapeutic exercise;Balance training;Neuromuscular re-education;Patient/family education    PT Goals (Current goals can be found in the Care Plan section)  Acute Rehab PT Goals Patient Stated Goal: to get home and feel better PT Goal Formulation: With patient/family Time For Goal Achievement: 02/13/21 Potential to Achieve Goals: Good    Frequency Min 3X/week   Barriers to discharge Inaccessible home environment;Decreased caregiver support has been home alone but will have a family member to stay with him short term    Co-evaluation               AM-PAC PT "6 Clicks" Mobility  Outcome Measure Help needed turning from your back to your side while in a flat bed without using bedrails?: A Little Help needed moving from lying on your back to sitting on the side of a flat bed without using bedrails?: A Little Help needed moving to and from a bed to a chair (including a wheelchair)?: A Little Help needed standing up from a chair using your arms (e.g., wheelchair or bedside chair)?: A Little Help needed to walk in hospital room?: A Little Help needed climbing 3-5 steps with a railing? : A Lot 6 Click Score: 17    End of Session Equipment Utilized During Treatment: Gait belt Activity Tolerance: Patient limited by fatigue Patient left: in chair;with call bell/phone within reach;with chair alarm set;with  family/visitor present Nurse Communication: Mobility status PT Visit Diagnosis: Unsteadiness on feet (R26.81);Muscle weakness (generalized) (M62.81);Difficulty in walking, not elsewhere classified (R26.2)    Time: [id number] PT Time Calculation (min) (ACUTE ONLY): 26 min   Charges:   PT Evaluation $PT Eval Moderate Complexity: 1 Mod PT Treatments $Gait Training: 8-22 mins       Ramond Dial 01/23/2021, 4:13 PM Mee Hives, PT MS Acute Rehab Dept. Number: [place] [facility] [place]

## 2021-01-23 NOTE — Progress Notes (Addendum)
PROGRESS NOTE    Mark [name]  CVK:[id number] DOB: [birth year] DOA: 02/03/2021 PCP: Delrae Rend, MD    No chief complaint on file.   Brief Narrative:  Mark [name] is a 60 y.o. male with medical history significant of recurrent left hip dislocation,hypertension, hyperlipidemia, obesity, diet-controlled diabetes ,chronic kidney disease stage V with baseline creatinine of 5, left arm fistula in anticipation of hemodialysis, chronic diastolic CHF, atrial flutter who was brought to elective cardioversion today, found to have significant fluid overload and needing admission.    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 of insulin   Benign essential HTN   CKD (chronic kidney disease), stage IV (HCC)   Atrial flutter (HCC)   Acquired thrombophilia (HCC)   Acute on chronic diastolic CHF exacerbation -Received 2 dose of IV Lasix with good response, -on lasix -Management per nephrology and cardiology  CKD 5 Does not have acute indication for dialysis Nephrology following  A flutter status post DCCV yesterday, back to aflutter,on metoprolol and Eliquis Cardiology following  Chronic hypotension On midodrine chronically 50 mg 3 times daily  BPH Continue flomax Recent h/o urinary retention while in the hospital in 12/2020 , good urine output this time since admission, will monitor   Leukocytosis Does not appear to be septic,   UA is large leukocytes, rare bacteria, greater than 50 WBC, urine culture pending Chest x-ray no acute infiltrate Persistently elevated wbc, will check blood culture   Left shoulder pain, report ongoing for a month, got  worse after DCCV  left shoulder x-ray no acute findings ESR Significantly elevated with persistent leukocytosis, significant pain, will get mri cervical and mri shoulder   obesity: Body mass index is 35.55 kg/m.Marland Kitchen      Unresulted Labs (From admission, onward)          Start     Ordered   01/24/21 6067  Basic metabolic panel  Daily,   R      01/23/21 0853   01/24/21 0500  CK  Tomorrow morning,   R        01/23/21 1436   01/24/21 0500  Hepatic function panel  Tomorrow morning,   R        01/23/21 1439   01/24/21 0500  CBC with Differential/Platelet  Daily,   R      01/23/21 1447   01/24/21 7034  Basic metabolic panel  Daily,   R      01/23/21 1447   01/24/21 0500  Magnesium  Tomorrow morning,   R        01/23/21 1447   01/23/21 1439  Lactic acid, plasma  Once,   R        01/23/21 1438   01/23/21 1433  Culture, blood (routine x 2)  BLOOD CULTURE X 2,   R      01/23/21 1432   01/22/21 0842  Culture, Urine  Once,   R        01/22/21 0841   Signed and Held  Protime-INR  Once,   STAT        Signed and Held  DVT prophylaxis: Place and maintain sequential compression device Start: 01/23/21 1139 apixaban (ELIQUIS) tablet 5 mg   Code Status: Full Family Communication: Patient Disposition:   Status is: Inpatient  Dispo: The patient is from: Home              Anticipated d/c is to: Home              Anticipated d/c date is: DCCV planned on Monday, needs nephrology/cardiology clearance                Consultants:   Cardiology  Nephrology  Procedures:   DCCV prior to admission DCCV planned on Monday  Antimicrobials:    Anti-infectives (From admission, onward)   None         Objective: Vitals:   01/22/21 1928 01/23/21 0413 01/23/21 0751 01/23/21 1100  BP: (!) 117/91 (!) 124/97 103/63 (!) 87/64  Pulse: 99 (!) 41 81 99  Resp: 20 18 18 20   Temp: 98.9 F (37.2 C) 98.7 F (37.1 C) 98.3 F (36.8 C) 98.2 F (36.8 C)  TempSrc: Oral Oral Oral  Oral  SpO2: 100% 95% 96% 96%  Weight:  109.2 kg    Height:        Intake/Output Summary (Last 24 hours) at 01/23/2021 1447 Last data filed at 01/23/2021 0400 Gross per 24 hour  Intake 240 ml  Output 525 ml  Net -285 ml   Filed Weights   01/22/21 0019 01/22/21 0500 01/23/21 0413  Weight: 109 kg 109 kg 109.2 kg    Examination:  General exam: calm, NAD Respiratory system: Clear to auscultation. Respiratory effort normal. Cardiovascular system: S1 & S2 heard, aflutter, + pedal edema. Gastrointestinal system: Abdomen is nondistended, soft and nontender. . Normal bowel sounds heard. Central nervous system: Alert and oriented. No focal neurological deficits. Extremities: left shoulder limited range of motion due to pain, bilateral lower extremity pitting edema Skin: No rashes, lesions or ulcers Psychiatry: Judgement and insight appear normal. Mood & affect appropriate.     Data Reviewed: I have personally reviewed following labs and imaging studies  CBC: Recent Labs  Lab 01/22/21 0336 01/23/21 0944  WBC 17.0* 18.0*  NEUTROABS  --  15.3*  HGB 7.9* 7.4*  HCT 25.0* 23.0*  MCV 100.0 99.1  PLT 208 673    Basic Metabolic Panel: Recent Labs  Lab 01/27/2021 1703 01/22/21 0336 01/23/21 0944  NA 137 136 132*  K 3.6 3.7 4.0  CL 99 99 96*  CO2 23 21* 21*  GLUCOSE 103* 99 184*  BUN 106* 113* 125*  CREATININE 5.41* 5.59* 5.47*  CALCIUM 11.4* 11.5* 11.6*  MG 2.2  --   --   PHOS 8.2* 8.0*  --     GFR: Estimated Creatinine Clearance: 17.3 mL/min (A) (by C-G formula based on SCr of 5.47 mg/dL (H)).  Liver Function Tests: Recent Labs  Lab 01/19/2021 1703 01/22/21 0336  ALBUMIN 2.7* 2.6*    CBG: No results for input(s): GLUCAP in the last 168 hours.   Recent Results (from the past 240 hour(s))  SARS CORONAVIRUS 2 (TAT 6-24 HRS) Nasopharyngeal Nasopharyngeal Swab     Status: None   Collection Time: 01/19/21 12:21 PM   Specimen: Nasopharyngeal Swab  Result Value Ref  Range Status   SARS Coronavirus 2 NEGATIVE NEGATIVE Final    Comment: (NOTE) SARS-CoV-2 target nucleic acids are NOT DETECTED.  The SARS-CoV-2 RNA is generally detectable in upper and lower respiratory specimens during the acute phase of infection.  Negative results do not preclude SARS-CoV-2 infection, do not rule out co-infections with other pathogens, and should not be used as the sole basis for treatment or other patient management decisions. Negative results must be combined with clinical observations, patient history, and epidemiological information. The expected result is Negative.  Fact Sheet for Patients: SugarRoll.be  Fact Sheet for Healthcare Providers: https://www.woods-mathews.com/  This test is not yet approved or cleared by the [place] FDA and  has been authorized for detection and/or diagnosis of SARS-CoV-2 by FDA under an Emergency Use Authorization (EUA). This EUA will remain  in effect (meaning this test can be used) for the duration of the COVID-19 declaration under Se ction 564(b)(1) of the Act, 21 U.S.C. section 360bbb-3(b)(1), unless the authorization is terminated or revoked sooner.  Performed at [facility], [place] [address] [place], [place] [address]          Radiology Studies: DG Chest 2 View  Result Date: 01/22/2021 CLINICAL DATA:  Pleuritic chest pain.  Shortness of breath. EXAM: CHEST - 2 VIEW COMPARISON:  Chest x-ray dated December 14, 2020. FINDINGS: Cardiomediastinal silhouette remains enlarged. Normal pulmonary vascularity. Unchanged streaky linear atelectasis and/or scarring in the lingula and both lower lobes. No focal consolidation, pleural effusion, or pneumothorax. No acute osseous abnormality. IMPRESSION: 1. No acute cardiopulmonary disease. Electronically Signed   By: Titus Dubin M.D.   On: 01/22/2021 13:59   DG Shoulder Left  Result Date: 01/22/2021 CLINICAL DATA:  Acute left  shoulder pain. EXAM: LEFT SHOULDER - 2+ VIEW COMPARISON:  None. FINDINGS: There is no evidence of fracture or dislocation. There is no evidence of arthropathy or other focal bone abnormality. Soft tissues are unremarkable. IMPRESSION: Negative. Electronically Signed   By: Marijo Conception M.D.   On: 01/22/2021 18:53        Scheduled Meds: . apixaban  5 mg Oral BID  . darbepoetin (ARANESP) injection - NON-DIALYSIS  150 mcg Subcutaneous Q Thu-1800  . diclofenac Sodium  2 g Topical QID  . ferric citrate  420 mg Oral TID WC  . furosemide  80 mg Oral BID  . loratadine  10 mg Oral Daily  . metoprolol succinate  12.5 mg Oral Daily  . midodrine  15 mg Oral TID WC  . multivitamin with minerals  1 tablet Oral QPC breakfast  . tamsulosin  0.4 mg Oral QPC supper  . vitamin B-12  1,000 mcg Oral Daily   Continuous Infusions: . ferric gluconate (FERRLECIT/NULECIT) IV 250 mg (01/14/2021 1806)     LOS: 2 days   Time spent: 39mns Greater than 50% of this time was spent in counseling, explanation of diagnosis, planning of further management, and coordination of care.   Voice Recognition /Viviann Sparedictation system was used to create this note, attempts have been made to correct errors. Please contact the author with questions and/or clarifications.   FFlorencia Reasons MD PhD FACP [facility]  Available via Epic secure chat 7am-7pm for nonurgent issues Please page for urgent issues To page the attending provider between 7A-7P or the covering provider during after hours 7P-7A, please log into the web site [email or web address] and access using universal [place] password for that web site. If you do not have the password, please call the hospital operator.    01/23/2021, 2:47 PM

## 2021-01-24 ENCOUNTER — Inpatient Hospital Stay (HOSPITAL_COMMUNITY): Payer: 59

## 2021-01-24 DIAGNOSIS — J9600 Acute respiratory failure, unspecified whether with hypoxia or hypercapnia: Secondary | ICD-10-CM

## 2021-01-24 DIAGNOSIS — I4892 Unspecified atrial flutter: Secondary | ICD-10-CM | POA: Diagnosis not present

## 2021-01-24 DIAGNOSIS — R0603 Acute respiratory distress: Secondary | ICD-10-CM

## 2021-01-24 DIAGNOSIS — E8779 Other fluid overload: Secondary | ICD-10-CM | POA: Diagnosis not present

## 2021-01-24 DIAGNOSIS — I5033 Acute on chronic diastolic (congestive) heart failure: Secondary | ICD-10-CM | POA: Diagnosis not present

## 2021-01-24 DIAGNOSIS — N184 Chronic kidney disease, stage 4 (severe): Secondary | ICD-10-CM | POA: Diagnosis not present

## 2021-01-24 DIAGNOSIS — J9602 Acute respiratory failure with hypercapnia: Secondary | ICD-10-CM

## 2021-01-24 DIAGNOSIS — I484 Atypical atrial flutter: Secondary | ICD-10-CM | POA: Diagnosis not present

## 2021-01-24 LAB — CBC WITH DIFFERENTIAL/PLATELET
Abs Immature Granulocytes: 0.22 10*3/uL — ABNORMAL HIGH (ref 0.00–0.07)
Basophils Absolute: 0 10*3/uL (ref 0.0–0.1)
Basophils Relative: 0 %
Eosinophils Absolute: 0 10*3/uL (ref 0.0–0.5)
Eosinophils Relative: 0 %
HCT: 23.2 % — ABNORMAL LOW (ref 39.0–52.0)
Hemoglobin: 7.3 g/dL — ABNORMAL LOW (ref 13.0–17.0)
Immature Granulocytes: 1 %
Lymphocytes Relative: 6 %
Lymphs Abs: 1.1 10*3/uL (ref 0.7–4.0)
MCH: 31.6 pg (ref 26.0–34.0)
MCHC: 31.5 g/dL (ref 30.0–36.0)
MCV: 100.4 fL — ABNORMAL HIGH (ref 80.0–100.0)
Monocytes Absolute: 1.3 10*3/uL — ABNORMAL HIGH (ref 0.1–1.0)
Monocytes Relative: 7 %
Neutro Abs: 15.8 10*3/uL — ABNORMAL HIGH (ref 1.7–7.7)
Neutrophils Relative %: 86 %
Platelets: 256 10*3/uL (ref 150–400)
RBC: 2.31 MIL/uL — ABNORMAL LOW (ref 4.22–5.81)
RDW: 14.9 % (ref 11.5–15.5)
WBC: 18.6 10*3/uL — ABNORMAL HIGH (ref 4.0–10.5)
nRBC: 0 % (ref 0.0–0.2)

## 2021-01-24 LAB — BLOOD GAS, ARTERIAL
Acid-base deficit: 3.7 mmol/L — ABNORMAL HIGH (ref 0.0–2.0)
Bicarbonate: 24 mmol/L (ref 20.0–28.0)
Drawn by: 164
FIO2: 100
O2 Saturation: 99.2 %
Patient temperature: 37
pCO2 arterial: 71.1 mmHg (ref 32.0–48.0)
pH, Arterial: 7.154 — CL (ref 7.350–7.450)
pO2, Arterial: 293 mmHg — ABNORMAL HIGH (ref 83.0–108.0)

## 2021-01-24 LAB — POCT I-STAT 7, (LYTES, BLD GAS, ICA,H+H)
Acid-Base Excess: 0 mmol/L (ref 0.0–2.0)
Bicarbonate: 26.5 mmol/L (ref 20.0–28.0)
Calcium, Ion: 1.54 mmol/L (ref 1.15–1.40)
HCT: 23 % — ABNORMAL LOW (ref 39.0–52.0)
Hemoglobin: 7.8 g/dL — ABNORMAL LOW (ref 13.0–17.0)
O2 Saturation: 99 %
Patient temperature: 97.3
Potassium: 4.5 mmol/L (ref 3.5–5.1)
Sodium: 135 mmol/L (ref 135–145)
TCO2: 28 mmol/L (ref 22–32)
pCO2 arterial: 53.6 mmHg — ABNORMAL HIGH (ref 32.0–48.0)
pH, Arterial: 7.298 — ABNORMAL LOW (ref 7.350–7.450)
pO2, Arterial: 164 mmHg — ABNORMAL HIGH (ref 83.0–108.0)

## 2021-01-24 LAB — BASIC METABOLIC PANEL
Anion gap: 14 (ref 5–15)
BUN: 141 mg/dL — ABNORMAL HIGH (ref 8–23)
CO2: 23 mmol/L (ref 22–32)
Calcium: 11.6 mg/dL — ABNORMAL HIGH (ref 8.9–10.3)
Chloride: 95 mmol/L — ABNORMAL LOW (ref 98–111)
Creatinine, Ser: 5.75 mg/dL — ABNORMAL HIGH (ref 0.61–1.24)
GFR, Estimated: 10 mL/min — ABNORMAL LOW (ref >60)
Glucose, Bld: 145 mg/dL — ABNORMAL HIGH (ref 70–99)
Potassium: 4.4 mmol/L (ref 3.5–5.1)
Sodium: 132 mmol/L — ABNORMAL LOW (ref 135–145)

## 2021-01-24 LAB — HEPATIC FUNCTION PANEL
ALT: 15 U/L (ref 0–44)
AST: 22 U/L (ref 15–41)
Albumin: 2.4 g/dL — ABNORMAL LOW (ref 3.5–5.0)
Alkaline Phosphatase: 68 U/L (ref 38–126)
Bilirubin, Direct: 0.2 mg/dL (ref 0.0–0.2)
Indirect Bilirubin: 1 mg/dL — ABNORMAL HIGH (ref 0.3–0.9)
Total Bilirubin: 1.2 mg/dL (ref 0.3–1.2)
Total Protein: 7.6 g/dL (ref 6.5–8.1)

## 2021-01-24 LAB — CBC
HCT: 21.9 % — ABNORMAL LOW (ref 39.0–52.0)
Hemoglobin: 6.9 g/dL — CL (ref 13.0–17.0)
MCH: 31.8 pg (ref 26.0–34.0)
MCHC: 31.5 g/dL (ref 30.0–36.0)
MCV: 100.9 fL — ABNORMAL HIGH (ref 80.0–100.0)
Platelets: 278 10*3/uL (ref 150–400)
RBC: 2.17 MIL/uL — ABNORMAL LOW (ref 4.22–5.81)
RDW: 15.2 % (ref 11.5–15.5)
WBC: 19.8 10*3/uL — ABNORMAL HIGH (ref 4.0–10.5)
nRBC: 0 % (ref 0.0–0.2)

## 2021-01-24 LAB — TROPONIN I (HIGH SENSITIVITY): Troponin I (High Sensitivity): 70 ng/L — ABNORMAL HIGH (ref <18)

## 2021-01-24 LAB — HEPATITIS B CORE ANTIBODY, TOTAL: Hep B Core Total Ab: NONREACTIVE

## 2021-01-24 LAB — HEPATITIS B SURFACE ANTIBODY,QUALITATIVE: Hep B S Ab: NONREACTIVE

## 2021-01-24 LAB — MAGNESIUM: Magnesium: 2.4 mg/dL (ref 1.7–2.4)

## 2021-01-24 LAB — HEPATITIS B SURFACE ANTIGEN: Hepatitis B Surface Ag: NONREACTIVE

## 2021-01-24 LAB — CK: Total CK: 22 U/L — ABNORMAL LOW (ref 49–397)

## 2021-01-24 LAB — MRSA PCR SCREENING: MRSA by PCR: NEGATIVE

## 2021-01-24 LAB — PREPARE RBC (CROSSMATCH)

## 2021-01-24 MED ORDER — CHLORHEXIDINE GLUCONATE CLOTH 2 % EX PADS
6.0000 | MEDICATED_PAD | Freq: Every day | CUTANEOUS | Status: DC
  Administered 2021-01-24 – 2021-02-05 (×12): 6 via TOPICAL

## 2021-01-24 MED ORDER — HEPARIN SODIUM (PORCINE) 1000 UNIT/ML DIALYSIS: 1000.0000 [IU] | INTRAMUSCULAR | Status: DC | PRN

## 2021-01-24 MED ORDER — FERRIC CITRATE 1 GM 210 MG(FE) PO TABS
630.0000 mg | ORAL_TABLET | Freq: Three times a day (TID) | ORAL | Status: DC
  Administered 2021-01-25 – 2021-01-28 (×9): 630 mg via ORAL
  Filled 2021-01-24 (×13): qty 3

## 2021-01-24 MED ORDER — SODIUM CHLORIDE 0.9 % IV SOLN
1.0000 g | INTRAVENOUS | Status: DC
  Administered 2021-01-24 – 2021-01-25 (×2): 1 g via INTRAVENOUS
  Filled 2021-01-24: qty 10
  Filled 2021-01-24: qty 1
  Filled 2021-01-24: qty 10

## 2021-01-24 MED ORDER — FUROSEMIDE 10 MG/ML IJ SOLN
120.0000 mg | Freq: Two times a day (BID) | INTRAVENOUS | Status: DC
  Administered 2021-01-24 – 2021-01-25 (×2): 120 mg via INTRAVENOUS
  Filled 2021-01-24 (×3): qty 12
  Filled 2021-01-24 (×2): qty 10

## 2021-01-24 MED ORDER — SODIUM CHLORIDE 0.9% IV SOLUTION: Freq: Once | INTRAVENOUS | Status: DC

## 2021-01-24 MED ORDER — LIDOCAINE HCL (PF) 1 % IJ SOLN
5.0000 mL | INTRAMUSCULAR | Status: DC | PRN
Start: 2021-01-24 — End: 2021-01-24

## 2021-01-24 MED ORDER — LIDOCAINE-PRILOCAINE 2.5-2.5 % EX CREA: 1.0000 "application " | TOPICAL_CREAM | CUTANEOUS | Status: DC | PRN

## 2021-01-24 MED ORDER — PANTOPRAZOLE SODIUM 20 MG PO TBEC
20.0000 mg | DELAYED_RELEASE_TABLET | Freq: Every day | ORAL | Status: DC
  Administered 2021-01-25 – 2021-01-28 (×3): 20 mg via ORAL
  Filled 2021-01-24 (×4): qty 1

## 2021-01-24 MED ORDER — PENTAFLUOROPROP-TETRAFLUOROETH EX AERO
1.0000 | INHALATION_SPRAY | CUTANEOUS | Status: DC | PRN
Start: 2021-01-24 — End: 2021-01-24

## 2021-01-24 MED ORDER — FUROSEMIDE 10 MG/ML IJ SOLN: 80.0000 mg | Freq: Once | INTRAMUSCULAR | Status: DC

## 2021-01-24 MED ORDER — SODIUM CHLORIDE 0.9 % IV SOLN: 100.0000 mL | INTRAVENOUS | Status: DC | PRN

## 2021-01-24 MED ORDER — ALTEPLASE 2 MG IJ SOLR: 2.0000 mg | Freq: Once | INTRAMUSCULAR | Status: DC | PRN

## 2021-01-24 NOTE — Progress Notes (Addendum)
Progress Note  Patient Name: Mark [name] Date of Encounter: 01/24/2021  Primary Cardiologist: [name], MD   Subjective   No complaints today.   Inpatient Medications    Scheduled Meds: . apixaban  5 mg Oral BID  . darbepoetin (ARANESP) injection - NON-DIALYSIS  150 mcg Subcutaneous Q Thu-1800  . diclofenac Sodium  2 g Topical QID  . ferric citrate  420 mg Oral TID WC  . furosemide  80 mg Oral BID  . loratadine  10 mg Oral Daily  . metoprolol succinate  12.5 mg Oral Daily  . midodrine  15 mg Oral TID WC  . multivitamin with minerals  1 tablet Oral QPC breakfast  . tamsulosin  0.4 mg Oral QPC supper  . vitamin B-12  1,000 mcg Oral Daily   Continuous Infusions: . ferric gluconate (FERRLECIT/NULECIT) IV 250 mg (01/16/2021 1806)   PRN Meds: acetaminophen **OR** acetaminophen, Muscle Rub, polyethylene glycol, traMADol   Vital Signs    Vitals:   01/23/21 2004 01/24/21 0105 01/24/21 0519 01/24/21 0729  BP: 100/62  (!) 120/104 97/79  Pulse: 76  67 81  Resp: '19  20 20  '$ Temp: 99.2 F (37.3 C)  98.9 F (37.2 C) (!) 97.5 F (36.4 C)  TempSrc: Oral  Oral Oral  SpO2: 97%  100% 100%  Weight:  109.6 kg    Height:        Intake/Output Summary (Last 24 hours) at 01/24/2021 0849 Last data filed at 01/24/2021 0231 Gross per 24 hour  Intake 75 ml  Output 450 ml  Net -375 ml   Filed Weights   01/22/21 0500 01/23/21 0413 01/24/21 0105  Weight: 109 kg 109.2 kg 109.6 kg    Physical Exam   General: Obese, NAD Neck: Negative for carotid bruits. No JVD Lungs:Clear to ausculation bilaterally. No wheezes, rales, or rhonchi. Breathing is unlabored. Cardiovascular: Irregularly irregular. No murmurs Abdomen: Soft, non-tender, distended. No obvious abdominal masses. Extremities: 2-3 + BLE edema. Radial  pulses 2+ bilaterally Neuro: Alert and oriented. No focal deficits. No facial asymmetry. MAE spontaneously. Psych: Responds to questions appropriately with normal  affect.    Labs    Chemistry Recent Labs  Lab 01/06/2021 1703 01/22/21 0336 01/23/21 0944 01/24/21 0303  NA 137 136 132* 132*  K 3.6 3.7 4.0 4.4  CL 99 99 96* 95*  CO2 23 21* 21* 23  GLUCOSE 103* 99 184* 145*  BUN 106* 113* 125* 141*  CREATININE 5.41* 5.59* 5.47* 5.75*  CALCIUM 11.4* 11.5* 11.6* 11.6*  PROT  --   --   --  7.6  ALBUMIN 2.7* 2.6*  --  2.4*  AST  --   --   --  22  ALT  --   --   --  15  ALKPHOS  --   --   --  68  BILITOT  --   --   --  1.2  GFRNONAA 11* 11* 11* 10*  ANIONGAP 15 16* 15 14     Hematology Recent Labs  Lab 01/22/21 0336 01/23/21 0944 01/24/21 0303  WBC 17.0* 18.0* 18.6*  RBC 2.50* 2.32* 2.31*  HGB 7.9* 7.4* 7.3*  HCT 25.0* 23.0* 23.2*  MCV 100.0 99.1 100.4*  MCH 31.6 31.9 31.6  MCHC 31.6 32.2 31.5  RDW 14.9 14.9 14.9  PLT 208 263 256    Cardiac EnzymesNo results for input(s): TROPONINI in the last 168 hours. No results for input(s): TROPIPOC in the last  168 hours.   BNPNo results for input(s): BNP, PROBNP in the last 168 hours.   DDimer No results for input(s): DDIMER in the last 168 hours.   Radiology    DG Chest 2 View  Result Date: 01/22/2021 CLINICAL DATA:  Pleuritic chest pain.  Shortness of breath. EXAM: CHEST - 2 VIEW COMPARISON:  Chest x-ray dated December 14, 2020. FINDINGS: Cardiomediastinal silhouette remains enlarged. Normal pulmonary vascularity. Unchanged streaky linear atelectasis and/or scarring in the lingula and both lower lobes. No focal consolidation, pleural effusion, or pneumothorax. No acute osseous abnormality. IMPRESSION: 1. No acute cardiopulmonary disease. Electronically Signed   By: Titus Dubin M.D.   On: 01/22/2021 13:59   DG Shoulder Left  Result Date: 01/22/2021 CLINICAL DATA:  Acute left shoulder pain. EXAM: LEFT SHOULDER - 2+ VIEW COMPARISON:  None. FINDINGS: There is no evidence of fracture or dislocation. There is no evidence of arthropathy or other focal bone abnormality. Soft tissues are  unremarkable. IMPRESSION: Negative. Electronically Signed   By: Marijo Conception M.D.   On: 01/22/2021 18:53   Telemetry    01/23/21 AF with rates in the 60-80s Personally Reviewed  ECG    No new tracing as of 01/23/21- Personally Reviewed  Cardiac Studies   Echo 10/2020  1. No subcostal views.  2. Left ventricular ejection fraction, by estimation, is 60 to 65%. The  left ventricle has normal function. The left ventricle has no regional  wall motion abnormalities. There is mild left ventricular hypertrophy.  Left ventricular diastolic parameters  are indeterminate.  3. Right ventricular systolic function is normal. The right ventricular  size is normal.  4. Left atrial size was severely dilated.  5. The mitral valve is normal in structure. Mild mitral valve  regurgitation. No evidence of mitral stenosis. Severe mitral annular  calcification.  6. The aortic valve is normal in structure. Aortic valve regurgitation is  trivial. Moderate aortic valve stenosis.     DCCV 01/28/2021 Evaluation: Findings: Post procedure EKG shows:Sinus with mobitz 1 AVB and PACs Complications:None  After the procedure, the patient was hypotensive requiring pressor support. Was also notably overloaded on physical exam. Given tenuous status, decision was made to admit to in-patient for pressor support and IV diuresis. Plan discussed with admitting physician, Dr. Ellyn Hack and primary cardiologist Dr. Margaretann Loveless.  Patient Profile     60 y.o. male with a history of moderate AS, CKD V, HTN, gout, persistent atrial flutter, DM2, and hx of hip dislocations and recent acute on chronic diastolic CHF with acute respiratory failure>> found to be in atrial flutter. Plan was for OP DCCV>>scheduled for 01/22/21. On arrival pt found to be severely volume overloaded.  Assessment & Plan    1. Atrial flutter: 2. Acute on chronic CHF: 3. Pleuritic chest pain: 4. Hypotension: 5. Moderate AS: 6. CKD stage  V:  60 year old patient with known chronic diastolic CHF with CKD stage V, anemia and persistent atrial flutter was admitted for cardioversion and acute on chronic CHF, he underwent successful cardioversion yesterday unfortunately few hours later went back to atrial flutter.    He has diuresed 3 L in the last 2 days, his creatinine is 5.75, BUN up to 141, neprhology recommended switching to Lasix 80 mg PO BID starting last night, we ordered compression socks, DCCV planned for Monday.  Nephrology he does not require dialysis yet. He has severe anemia with hemoglobin 7.3 most probably related to his CKD stage IV.  Ena Dawley, MD 01/24/2021

## 2021-01-24 NOTE — Significant Event (Signed)
Rapid Response Event Note   Reason for Call :  Chest pain and SOB  Initial Focused Assessment:  Received a call from HD that this patient had new onset of 8/10 left sided chest pain. Patient is also endorsing shortness of breath as well.  Today is his first HD session.  The patient underwent 45 minutes of HD and no fluid was removed according to the HD RN.  The patient was not having any chest pain before HD and was only requiring 2L [place].  When I got to the bedside the patient was using accessory muscles to breathe.  An RN put the patient on NRB 16 L and he was still complaining of difficulty breathing.  HD treatment was terminated, patient transported back to [place], and he was placed on bipap.     Interventions:  ABG, vitals, chest x-ray, troponin, bipap  Plan of Care:  CCM consulted   Event Summary:   MD Notified: Xu Call [place] Time: [id number] End Time: 1910  Venetia Maxon, RN

## 2021-01-24 NOTE — Progress Notes (Signed)
[facility] NEPHROLOGY PROGRESS NOTE  Assessment/ Plan: Pt is a 60 y.o. yo male  With PMH of HTN, DM, HLD, obesity, CKD stage V baseline creatinine level around 5 admitted with acute diastolic CHF.  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  He did not respond with oral diuretics.  Order Lasix IV 120 mg now and start HD today.  Discussed with the patient and and he agreed with the plan.  Inform HD unit as well.  He will need arrangement for outpatient dialysis. He has a left radiocephalic fistula that was created in October, 2017 by Dr. Scot Dock that is mature.  #Acute exacerbation of diastolic heart failure:  Initially responded with IV diuretics and then switched to oral Lasix.  He developed grossly volume overload and more azotemic.  Resume IV Lasix and starting dialysis as above.  Cardiology is following.  Follow with Dr Posey Pronto at [facility].  # Hypertension: On metoprolol for atrial flutter.  Midodrine for intermittent hypotension.  Dialysis and Lasix to manage volume.  # Anemia of chronic disease: Iron deficiency therefore ordered IV iron and ESA on 3/17.  Continue ESA.  # Secondary hyperparathyroidism: Both calcium and phosphorus level elevated. DC MVA and hold any vitamin D.  Increase Auryxia dose.  Dialysis with low calcium bath.  Phosphorus expect to improve with dialysis.  Discussed with the primary team.  Subjective: Seen and examined.  Switch to oral Lasix yesterday with minimal urine output, around 450 cc.  He is having shortness of breath, more weakness and tired today.  Denies chest pain, nausea or vomiting. Objective Vital signs in last 24 hours: Vitals:   01/24/21 0519 01/24/21 0729 01/24/21 0935 01/24/21 1334  BP: (!) 120/104 97/79 93/66 100/66  Pulse: 67 81 79 (!) 121  Resp: '20 20  20  '$ Temp: 98.9 F (37.2 C) (!) 97.5 F (36.4 C)  (!) 97.3 F (36.3 C)  TempSrc: Oral Oral  Oral  SpO2: 100% 100%  99%   Weight:      Height:       Weight change: 0.454 kg  Intake/Output Summary (Last 24 hours) at 01/24/2021 1433 Last data filed at 01/24/2021 1200 Gross per 24 hour  Intake 615 ml  Output 600 ml  Net 15 ml       Labs: Basic Metabolic Panel: Recent Labs  Lab 01/12/2021 1703 01/22/21 0336 01/23/21 0944 01/24/21 0303  NA 137 136 132* 132*  K 3.6 3.7 4.0 4.4  CL 99 99 96* 95*  CO2 23 21* 21* 23  GLUCOSE 103* 99 184* 145*  BUN 106* 113* 125* 141*  CREATININE 5.41* 5.59* 5.47* 5.75*  CALCIUM 11.4* 11.5* 11.6* 11.6*  PHOS 8.2* 8.0*  --   --    Liver Function Tests: Recent Labs  Lab 01/25/2021 1703 01/22/21 0336 01/24/21 0303  AST  --   --  22  ALT  --   --  15  ALKPHOS  --   --  68  BILITOT  --   --  1.2  PROT  --   --  7.6  ALBUMIN 2.7* 2.6* 2.4*   No results for input(s): LIPASE, AMYLASE in the last 168 hours. No results for input(s): AMMONIA in the last 168 hours. CBC: Recent Labs  Lab 01/22/21 0336 01/23/21 0944 01/24/21 0303  WBC 17.0* 18.0* 18.6*  NEUTROABS  --  15.3* 15.8*  HGB 7.9* 7.4* 7.3*  HCT 25.0* 23.0* 23.2*  MCV 100.0 99.1 100.4*  PLT 208 263 256   Cardiac Enzymes: Recent Labs  Lab 01/24/21 0303  CKTOTAL 22*   CBG: No results for input(s): GLUCAP in the last 168 hours.  Iron Studies:  Recent Labs    01/18/2021 1703  IRON 13*  TIBC 204*  FERRITIN 753*   Studies/Results: DG Shoulder Left  Result Date: 01/22/2021 CLINICAL DATA:  Acute left shoulder pain. EXAM: LEFT SHOULDER - 2+ VIEW COMPARISON:  None. FINDINGS: There is no evidence of fracture or dislocation. There is no evidence of arthropathy or other focal bone abnormality. Soft tissues are unremarkable. IMPRESSION: Negative. Electronically Signed   By: Marijo Conception M.D.   On: 01/22/2021 18:53    Medications: Infusions: . cefTRIAXone (ROCEPHIN)  IV    . ferric gluconate (FERRLECIT/NULECIT) IV 250 mg (01/27/2021 1806)    Scheduled Medications: . sodium chloride   Intravenous  Once  . apixaban  5 mg Oral BID  . darbepoetin (ARANESP) injection - NON-DIALYSIS  150 mcg Subcutaneous Q Thu-1800  . diclofenac Sodium  2 g Topical QID  . ferric citrate  420 mg Oral TID WC  . furosemide  80 mg Intravenous Once  . furosemide  80 mg Oral BID  . loratadine  10 mg Oral Daily  . metoprolol succinate  12.5 mg Oral Daily  . midodrine  15 mg Oral TID WC  . multivitamin with minerals  1 tablet Oral QPC breakfast  . pantoprazole  20 mg Oral Daily  . tamsulosin  0.4 mg Oral QPC supper  . vitamin B-12  1,000 mcg Oral Daily    have reviewed scheduled and prn medications.  Physical Exam: General: Sitting on chair, mild increased work of breathing Heart:RRR, s1s2 nl Lungs: Bilateral crackles, no wheeze Abdomen:soft, Non-tender, distended Extremities: Bilateral lower extremity edema worsen Dialysis Access: AV fistula has good thrill and bruit  Cathe Bilger Tanna Furry 01/24/2021,2:33 PM  LOS: 3 days

## 2021-01-24 NOTE — Progress Notes (Signed)
Patient was having dialysis today, 45 mins into treatment patient started complaining of difficulty breathing and chest pain. HR increasing up to 140s and very irregular. Rapid response team called. Patient put on non-rebreather mask with 16L 02, DR. [place] notified, ordered to stop HD. Patient did not receive blood transfusion during HD and was escorted back to the room by RR team.

## 2021-01-24 NOTE — Progress Notes (Signed)
Patient received from HD with rapid response nurse. Respiratory at bedside to perform ABG and bipap intiated. Cardiology at bedside and Critical care consulted

## 2021-01-24 NOTE — Consult Note (Signed)
NAME:  Mark [name], MRN:  [id number], DOB:  15-Jul-1961, LOS: 3 ADMISSION DATE:  01/28/2021, CONSULTATION DATE: 3/19 REFERRING MD:  Dr. Erlinda Hong, CHIEF COMPLAINT:  TRH    History of Present Illness:  60 y/o M who presented to [facility] on 3/16 with shortness of breath, leg swelling and palpitations.   The patient has known CKD IV with baseline sr cr of 5, conservative management, L arm AVF in place in anticipation of HD.  He was brought in by Cardiology 3/16 for elective cardioversion.  On presentation, he was found to have significant volume overload needing admission per TRH.  After cardioversion he became transiently hypotensive and briefly required neosynephrine gtt.  He was admitted and Nephrology was consulted. He was diuresed with 1.5L UOP. Urinalysis grew 100k proteus, sensitivities pending. He was started on rocephin.  The patient remained on minimal O2. He had intermittent complaints of shoulder / left chest pain.  There were concerns for possible blood in stool, FOBT pending.   During his initial iHD on 3/19 evening, the patient began having chest pain and shortness of breath approximately 45 minutes into the therapy with HR into the 140's.  He was placed on a NRB and HD was stopped. He was transferred back to his room and placed on BiPAP. ABG was obtained which showed an acute hypercarbic respiratory failure (7.154 / 71).  CXR showed marked cardiomegaly with basilar atelectasis. RRT was called to assess the patient.  Cardiology was consulted, he was given 120 mg IV lasix and recommended to be started on an amiodarone infusion.  Troponin was assessed and 70, lactic acid 1.2.  The patient was transferred to ICU for further observation.     Pertinent  Medical History  CKD IV - HD started this admit (01/20/2021) HTN HLD Chronic Diastolic CHF  Moderate AS Atrial Flutter - on Eliquis  Obesity  Anemia of Chronic Disease   Significant Hospital Events: Including procedures, antibiotic start and stop  dates in addition to other pertinent events    3/16 Admit after cardioversion, SOB, leg swelling / palpitations prior to presentation   3/19 PCCM consulted   Interim History / Subjective:  RN reports pt pulling at bipap mask, EKG leads Afebrile   Objective   Blood pressure 122/79, pulse (!) 110, temperature 97.6 F (36.4 C), temperature source Oral, resp. rate 19, height '5\' 9"'$  (1.753 m), weight 109.6 kg, SpO2 100 %.        Intake/Output Summary (Last 24 hours) at 01/24/2021 1924 Last data filed at 01/24/2021 1758 Gross per 24 hour  Intake 2184.08 ml  Output 750 ml  Net 1434.08 ml   Filed Weights   01/22/21 0500 01/23/21 0413 01/24/21 0105  Weight: 109 kg 109.2 kg 109.6 kg    Examination: General: adult male lying in bed in NAD on BiPAP HEENT: MM pink/moist, BiPAP mask in place, anicteric, pupils equal / reactive Neuro: eyes closed, awakens to voice, picking at leads but able to redirect, MAE CV: s1s2 irr irr, AF on monitor, no m/r/g, JVD+, L FA AVF PULM: non-labored, good air entry, lungs clear anterior, crackles in bases GI: soft, bsx4 active  Extremities: warm/dry, 1+ BLE pre-tibial edema       Resolved Hospital Problem list     Assessment & Plan:   Acute Hypercarbic Respiratory Failure in setting of Acute Diastolic CHF  -now BiPAP  -wean O2 for sats >90% -follow up ABG at 2200  -follow intermittent CXR  -pulmonary hygiene - mobilize  as able, IS  -hopeful to avoid intubation   CKD V / New ESRD  HD initiated this admit, did not tolerate initial HD due to chest pain -volume status per Nephrology  -lasix given per Cardiology -Trend BMP / urinary output -Replace electrolytes as indicated -? Repeat trial of iHD with slower volume removal vs CVVHD  Acute Exacerbation of dCHF Atrial Flutter  Chronic Hypotension S/P DCCV 3/16 with brief hypotension requiring neo, had some chest pain then.  On eliquis for AFlutter.  -per Cardiology > plan for repeat DCCV on  3/21 -tx to ICU  -follow troponin  -continue eliquis  -continue metoprolol -midodrine  -tele monitoring   Anemia of Chronic Disease, ? Acute Component  Reported red stool, hx of gastric ulcer  -follow up FOBT  -s/p PRBC 3/19  BPH -continue flomax  Leukocytosis  Chronic Right Thigh Wound  GNR Urine  -continue rocephin   Left Shoulder Pain  -follow up MRI    Best practice (evaluated daily)  Diet:  NPO Pain/Anxiety/Delirium protocol (if indicated): No VAP protocol (if indicated): Not indicated DVT prophylaxis: Systemic AC GI prophylaxis: N/A Glucose control:  Basal insulin No Central venous access:  N/A Arterial line:  N/A Foley:  N/A Mobility:  bed rest  PT consulted: Yes Last date of multidisciplinary goals of care discussion:  Code Status:  full code Disposition: ICU   Labs   CBC: Recent Labs  Lab 01/22/21 0336 01/23/21 0944 01/24/21 0303  WBC 17.0* 18.0* 18.6*  NEUTROABS  --  15.3* 15.8*  HGB 7.9* 7.4* 7.3*  HCT 25.0* 23.0* 23.2*  MCV 100.0 99.1 100.4*  PLT 208 [id number]    Basic Metabolic Panel: Recent Labs  Lab 02/04/2021 1703 01/22/21 0336 01/23/21 0944 01/24/21 0303  NA 137 136 132* 132*  K 3.6 3.7 4.0 4.4  CL 99 99 96* 95*  CO2 23 21* 21* 23  GLUCOSE 103* 99 184* 145*  BUN 106* 113* 125* 141*  CREATININE 5.41* 5.59* 5.47* 5.75*  CALCIUM 11.4* 11.5* 11.6* 11.6*  MG 2.2  --   --  2.4  PHOS 8.2* 8.0*  --   --    GFR: Estimated Creatinine Clearance: 16.5 mL/min (A) (by C-G formula based on SCr of 5.75 mg/dL (H)). Recent Labs  Lab 01/22/21 0336 01/23/21 0944 01/23/21 1509 01/24/21 0303  WBC 17.0* 18.0*  --  18.6*  LATICACIDVEN  --   --  1.2  --     Liver Function Tests: Recent Labs  Lab 02/02/2021 1703 01/22/21 0336 01/24/21 0303  AST  --   --  22  ALT  --   --  15  ALKPHOS  --   --  68  BILITOT  --   --  1.2  PROT  --   --  7.6  ALBUMIN 2.7* 2.6* 2.4*   No results for input(s): LIPASE, AMYLASE in the last 168 hours. No  results for input(s): AMMONIA in the last 168 hours.  ABG    Component Value Date/Time   PHART 7.154 (LL) 01/24/2021 1818   PCO2ART 71.1 (HH) 01/24/2021 1818   PO2ART 293 (H) 01/24/2021 1818   HCO3 24.0 01/24/2021 1818   TCO2 22.1 11/30/2010 1750   ACIDBASEDEF 3.7 (H) 01/24/2021 1818   O2SAT 99.2 01/24/2021 1818     Coagulation Profile: Recent Labs  Lab 02/01/2021 1703  INR 2.7*    Cardiac Enzymes: Recent Labs  Lab 01/24/21 0303  CKTOTAL 22*    HbA1C: Hgb A1c MFr  Bld  Date/Time Value Ref Range Status  10/11/2020 02:30 PM 5.5 4.8 - 5.6 % Final    Comment:    (NOTE) Pre diabetes:          5.7%-6.4%  Diabetes:              >6.4%  Glycemic control for   <7.0% adults with diabetes   10/06/2011 08:52 PM 5.4 <5.7 % Final    Comment:    (NOTE)                                                                       According to the ADA Clinical Practice Recommendations for 2011, when HbA1c is used as a screening test:  >=6.5%   Diagnostic of Diabetes Mellitus           (if abnormal result is confirmed) 5.7-6.4%   Increased risk of developing Diabetes Mellitus References:Diagnosis and Classification of Diabetes Mellitus,Diabetes [id number] 1):S62-S69 and Standards of Medical Care in         Diabetes - 2011,Diabetes [id number] (Suppl 1):S11-S61.    CBG: No results for input(s): GLUCAP in the last 168 hours.  Review of Systems:   Unable to complete due to BiPAP in place. Information obtained from staff at bedside, prior medical documentation.   Past Medical History:  He,  has a past medical history of Anemia, Arthritis, Bleeding ulcer, Blood transfusion, Chronic kidney disease, Complication of anesthesia, Dyspnea, Gout, Hip dislocation, left ([place]) (10/09/2020), Hypertension, Osteoporosis, and Pneumonia.   Surgical History:   Past Surgical History:  Procedure Laterality Date   AV FISTULA PLACEMENT Left 09/07/2016   Procedure: ARTERIOVENOUS (AV) FISTULA  CREATION LEFT ARM;  Surgeon: Angelia Mould, MD;  Location: [place];  Service: Vascular;  Laterality: Left;   CARDIOVERSION N/A 01/13/2021   Procedure: CARDIOVERSION;  Surgeon: Freada Bergeron, MD;  Location: [facility] ENDOSCOPY;  Service: Cardiovascular;  Laterality: N/A;   COLONOSCOPY     EXTERNAL EAR SURGERY     HIP CLOSED REDUCTION Left 07/27/2020   Procedure: CLOSED REDUCTION HIP;  Surgeon: Marchia Bond, MD;  Location: [place];  Service: Orthopedics;  Laterality: Left;   HIP PINNING  11/2010   JOINT REPLACEMENT     OTHER SURGICAL HISTORY  ~ 08/1998   repair bleeding ulcer   SHOULDER ARTHROSCOPY WITH ROTATOR CUFF REPAIR AND SUBACROMIAL DECOMPRESSION Right 05/26/2017   Procedure: SHOULDER ARTHROSCOPY WITH ROTATOR CUFF REPAIR AND SUBACROMIAL DECOMPRESSION;  Surgeon: Tania Ade, MD;  Location: [place];  Service: Orthopedics;  Laterality: Right;  SHOULDER ARTHROSCOPY WITH ROTATOR CUFF REPAIR AND SUBACROMIAL DECOMPRESSION   TOTAL HIP ARTHROPLASTY  10/06/11   left   TOTAL HIP ARTHROPLASTY  10/06/2011   Procedure: TOTAL HIP ARTHROPLASTY;  Surgeon: Kerin Salen;  Location: [place];  Service: Orthopedics;  Laterality: Left;     Social History:   reports that he has been smoking cigarettes. He has a 8.00 pack-year smoking history. He quit smokeless tobacco use about 47 years ago.  His smokeless tobacco use included chew. He reports current alcohol use of about 5.0 standard drinks of alcohol per week. He reports that he does not use drugs.   Family History:  His family history includes Atrial fibrillation in his mother; Cancer  in his father and mother; Hypertension in his mother.   Allergies Allergies  Allergen Reactions   Penicillins Swelling, Rash and Other (See Comments)    Arms and eyes swell & skin breaks out  Has patient had a PCN reaction causing immediate rash, facial/tongue/throat swelling, SOB or lightheadedness with hypotension: Yes Has patient had a PCN reaction  causing severe rash involving mucus membranes or skin necrosis: Yes Has patient had a PCN reaction that required hospitalization: No Has patient had a PCN reaction occurring within the last 10 years: No If all of the above answers are "NO", then may proceed with Cephalosporin use.    Atorvastatin     Muscle pain in legs   Rosuvastatin     Muscle pain in legs     Home Medications  Prior to Admission medications   Medication Sig Start Date End Date Taking? Authorizing Provider  apixaban (ELIQUIS) 5 MG TABS tablet Take 1 tablet (5 mg total) by mouth 2 (two) times daily. 12/05/20  Yes [name], MD  ferrous sulfate 325 (65 FE) MG tablet Take 1 tablet (325 mg total) by mouth daily with breakfast. 08/18/20  Yes Medina-Vargas, Monina C, NP  furosemide (LASIX) 80 MG tablet Take 1 tablet (80 mg total) by mouth 2 (two) times daily. 12/23/20  Yes Geradine Girt, DO  Ketotifen Fumarate (ALLERGY EYE DROPS OP) Place 1 drop into both eyes daily as needed (allergies).   Yes [provider]  loratadine (CLARITIN) 10 MG tablet Take 10 mg by mouth daily.   Yes [provider]  Menthol-Zinc Oxide (GOLD BOND EX) Apply 1 application topically daily as needed (itching).   Yes [provider]  metoprolol succinate (TOPROL-XL) 25 MG 24 hr tablet Take 12.5 mg by mouth daily.   Yes [provider]  midodrine (PROAMATINE) 5 MG tablet Take 3 tablets (15 mg total) by mouth 3 (three) times daily with meals. 12/23/20  Yes Geradine Girt, DO  Multiple Vitamin (MULTIVITAMIN WITH MINERALS) TABS tablet Take 1 tablet by mouth daily after breakfast.   Yes [provider]  polyethylene glycol (MIRALAX / GLYCOLAX) 17 g packet Take 17 g by mouth 2 (two) times daily as needed for mild constipation. 10/15/20  Yes Mercy Riding, MD  sevelamer carbonate (RENVELA) 800 MG tablet Take 1 tablet (800 mg total) by mouth 3 (three) times daily with meals. 10/29/20  Yes Medina-Vargas, Monina  C, NP  tamsulosin (FLOMAX) 0.4 MG CAPS capsule Take 1 capsule (0.4 mg total) by mouth daily. 12/24/20  Yes Geradine Girt, DO  vitamin B-12 (CYANOCOBALAMIN) 1000 MCG tablet Take 1,000 mcg by mouth daily.   Yes [provider]     Critical care time: 77 minutes      Noe Gens, MSN, APRN, NP-C, AGACNP-BC [facility] 01/24/2021, 9:44 PM   Please see [email or web address] for pager details.   From 7A-7P if no response, please call [phone] After hours, please call ELink [phone]

## 2021-01-24 NOTE — Progress Notes (Signed)
   01/24/21 1345  Assess: MEWS Score  Temp 98 F (36.7 C)  BP 100/66  Pulse Rate (!) 101  Resp 18  Level of Consciousness Alert  SpO2 99 %  O2 Device Nasal Cannula  O2 Flow Rate (L/min) 2 L/min  Assess: MEWS Score  MEWS Temp 0  MEWS Systolic 1  MEWS Pulse 1  MEWS RR 0  MEWS LOC 0  MEWS Score 2  MEWS Score Color Yellow  Assess: if the MEWS score is Yellow or Red  Were vital signs taken at a resting state? Yes  Focused Assessment No change from prior assessment  Early Detection of Sepsis Score *See Row Information* Low  MEWS guidelines implemented *See Row Information* Yes  Treat  Pain Scale 0-10  Pain Score 0  Take Vital Signs  Increase Vital Sign Frequency  Yellow: Q 2hr X 2 then Q 4hr X 2, if remains yellow, continue Q 4hrs  Document  Progress note created (see row info) Yes   Patient with increase in HR 90s-110s. No change in RR or BP. Patient with no new complaints but does feel short of breath with activity. Nephrology rounding and will initiate dialysis for patient. Will continue to monitor until HD completed

## 2021-01-24 NOTE — Significant Event (Signed)
Cardiology Moonlighter Note  Called by rapid response team about this patient who started to decline clinically while in his first HD session today. He developed chest pain and recurrent atrial flutter. He did not have any volume taken off and HD was terminated due to his symptoms. He had been on 2L nasal cannula oxygen but developed increasing respiratory distress eventually requiring BiPAP for work of breathing. A CXR was obtained and revealed a massively increased cardiac silhouette most prominently in the R side. An ABG was obtained and was significant for respiratory acidosis at 7.15/71/293. He has been taking eliquis since prior to admission for atrial flutter. He has received all of his doses during this hospitalization. An ECG reveals significant baseline artifact but what appears to be recurrent atrial flutter and frequent PVCs in a bigeminy pattern. No acute ST elevation is appreciated.   Given the patient clinical decline and respiratory failure, I would recommend CCM consult and transfer to the ICU for further treatment. The patient should be given '120mg'$  IV lasix and started on an amiodarone infusion '1mg'$ /min. Please continue bipap and continue to monitor ABG's. Please obtain lactate. Although the clinical picture does make PE seem likely, this is hard to reconcile with the fact that he has been taking apixaban consistently without any missed doses. Once he stabilizes, a PE scan should be obtained. Please also obtain an echocardiogram.   Please reach out to cardiology on call pager with any further questions. We will continue to follow  Marcie Mowers, MD Cardiology Fellow, PGY-8

## 2021-01-24 NOTE — Progress Notes (Signed)
RT called to assess pts breathing and obtain ABG. ABG was obtained and sent to lab. Pt was placed on BIPAP for increased WOB. MD at bedside.

## 2021-01-24 NOTE — Progress Notes (Signed)
Pt transported to 2h with no complications.

## 2021-01-24 NOTE — Progress Notes (Signed)
ABG results given to MD at bedside. No new orders given at this time.

## 2021-01-24 NOTE — Progress Notes (Signed)
Orders to transfuse 1 unit of RBCs per attending MD. Patient will now begin hemodialysis with first session today pending schedule and will wait to transfuse unit of blood until patient is upstairs for HD. Will be sure to inform HD department during handoff

## 2021-01-24 NOTE — Progress Notes (Signed)
OT Cancellation Note  Patient Details Name: Mark [name] MRN: [id number] DOB: 1961-01-31   Cancelled Treatment:    Reason Eval/Treat Not Completed: Patient at procedure or test/ unavailable (HD). OT will continue to follow for evaluation.  [place] 01/24/2021, 2:09 PM

## 2021-01-24 NOTE — Progress Notes (Addendum)
PROGRESS NOTE    Mark [name]  [id number] DOB: 22-Jul-1961 DOA: 01/17/2021 PCP: Delrae Rend, MD    No chief complaint on file.   Brief Narrative:  Mark [name] is a 60 y.o. male with medical history significant of recurrent left hip dislocation,hypertension, hyperlipidemia, obesity, diet-controlled diabetes ,chronic kidney disease stage V with baseline creatinine of 5, left arm fistula in anticipation of hemodialysis, chronic diastolic CHF, atrial flutter who was brought to elective cardioversion today, found to have significant fluid overload and needing admission.    Subjective:   He became tachypneaic when he got up last night, oxygen put on for symptom, no reported hypoxia He is Sitting up in chair now, he is on 4liter oxygen supplement, denies sob at rest, I did not hear him  cough Reports left shoulder and neck pain is better today Reports right thigh wound for a month with pus and blood, nontender, does not itch Also reports blood in urine, not sure if blood from thigh wound dripping in the urine  Also reports reddish color stool, denies ab pain, no n/v 450cc urine output documented last 24hrs, not sure is accurate No fever He appears tired and sleepy, with mild intentional tremor, no confusion  Assessment & Plan:   Principal Problem:   Fluid overload Active Problems:   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 without long term use of insulin   Benign essential HTN   CKD (chronic kidney disease), stage IV (HCC)   Atrial flutter (HCC)   Acquired thrombophilia ([name])   Acute on chronic diastolic CHF exacerbation -He was sent to the hospital from cardiology clinic due to dyspnea/volume overload -Received  IV Lasix , now on oral lasix with good urine output and improvement in edema --Management per nephrology and cardiology  A flutter status post DCCV on 3/17, back to aflutter,on metoprolol and Eliquis Cardiology following, plan repeat DCCV on  Monday  CKD 5 Initially thought no acute indication for dialysis however, Bun trending up, 450cc urine output documented last 24hrs Nephrology following, initiate dialysis today on 3/19  3/19 7pm Addendum: 74mns into dialysis, patient started to c/o left side chest pain, became tachypneic, then developed lethargy. EKG no acute ST/T changes, Stat abg showed ph 7.1 pco2 71, cxr on acute interval changes, he is put on bipap, troponin pending, cardiology notified, critical care consulted, he is to transfer to ICU .  Acute on chronic Anemia/anemia of chronic disease -Hemoglobin baseline above 8, hemoglobin drifting down to 7.3 -Also report blood in the urine, reddish color stool, report history of gastric ulcer, currently denies abdominal pain -check stool guaiac -prbc x1 with iv lasix  Chronic hypotension On midodrine chronically 50 mg 3 times daily  BPH Continue flomax Recent h/o urinary retention while in the hospital in 12/2020 , good urine output this time since admission, will monitor   Leukocytosis Does not appear to be septic,   UA is large leukocytes, rare bacteria, greater than 50 WBC, urine culture grew gram-negative rods Chest x-ray no acute infiltrate blood culture no growth Also have chronic right thigh wound, that is draining, will get wound swab culture Start Rocephin for now   Left shoulder pain, report ongoing for a month, got worse after DCCV  left shoulder x-ray no acute findings ESR Significantly elevated with persistent leukocytosis, significant pain, will get mri cervical and mri shoulder   obesity: Body mass index is 35.69 kg/m..  FTT: PT eval recommend home health, reports stopped driving recently  Unresulted Labs (From admission, onward)          Start     Ordered   01/25/21 0500  Vitamin B12  Tomorrow morning,   R       Question:  Specimen collection method  Answer:  Lab=Lab collect   01/24/21 1408   01/25/21 0500  Folate, serum, performed at  [place] morning,   R       Question:  Specimen collection method  Answer:  Lab=Lab collect   01/24/21 1408   01/24/21 1303  Type and screen [place]  Once,   R       Comments: [place]    01/24/21 1302   01/24/21 1303  Prepare RBC (crossmatch)  (Adult Blood Administration - Red Blood Cells)  Once,   R       Question Answer Comment  # of Units 1 unit   Transfusion Indications Symptomatic Anemia   Number of Units to Keep Ahead NO units ahead   If emergent release call blood bank [name] [phone]      01/24/21 1302   01/24/21 1254  Aerobic Culture w Gram Stain (superficial specimen)  Once,   R        01/24/21 1253   01/24/21 1254  MRSA PCR Screening  Once,   R        01/24/21 1253   01/24/21 3154  Basic metabolic panel  Daily,   R      01/23/21 0853   01/24/21 0500  CBC with Differential/Platelet  Daily,   R      01/23/21 1447   01/24/21 0086  Basic metabolic panel  Daily,   R      01/23/21 1447   Unscheduled  Occult blood card to lab, stool  As needed,   R      01/24/21 1259   Signed and Held  Protime-INR  Once,   STAT        Signed and Held            DVT prophylaxis: Place TED hose Start: 01/24/21 1014 Place and maintain sequential compression device Start: 01/23/21 1139 apixaban (ELIQUIS) tablet 5 mg   Code Status: Full Family Communication: Patient Disposition:   Status is: Inpatient  Dispo: The patient is from: Home              Anticipated d/c is to: Home with home health              Anticipated d/c date is: DCCV planned on Monday, needs nephrology/cardiology clearance                Consultants:   Cardiology  Nephrology  Procedures:   DCCV on 3/17 Repeat DCCV planned on Monday  Antimicrobials:    Anti-infectives (From admission, onward)   Start     Dose/Rate Route Frequency Ordered Stop   01/24/21 1445  cefTRIAXone (ROCEPHIN) 1 g in sodium chloride 0.9 % 100 mL IVPB        1  g 200 mL/hr over 30 Minutes Intravenous Every 24 hours 01/24/21 1356           Objective: Vitals:   01/24/21 0519 01/24/21 0729 01/24/21 0935 01/24/21 1334  BP: (!) 120/104 97/79 93/66 100/66  Pulse: 67 81 79 (!) 121  Resp: 20 20  20   Temp: 98.9 F (37.2 C) (!) 97.5 F (36.4 C)  (!) 97.3 F (36.3 C)  TempSrc: Oral Oral  Oral  SpO2: 100% 100%  99%  Weight:      Height:        Intake/Output Summary (Last 24 hours) at 01/24/2021 1408 Last data filed at 01/24/2021 1200 Gross per 24 hour  Intake 615 ml  Output 600 ml  Net 15 ml   Filed Weights   01/22/21 0500 01/23/21 0413 01/24/21 0105  Weight: 109 kg 109.2 kg 109.6 kg    Examination:  General exam: appear tired , sleepy ,  with fine Intentional tremor , aaox3 Respiratory system: Clear to auscultation. Respiratory effort normal. Cardiovascular system: S1 & S2 heard, aflutter, less pedal edema. Gastrointestinal system: Abdomen is nondistended, soft and nontender.  Normal bowel sounds heard. Central nervous system: sleepy but AAOx3 . No focal neurological deficits. Extremities: left shoulder limited range of motion due to pain, bilateral lower extremity pitting edema improved Skin: right thigh draining wound Psychiatry: Appear weak and sleepy, calm and cooperative      Data Reviewed: I have personally reviewed following labs and imaging studies  CBC: Recent Labs  Lab 01/22/21 0336 01/23/21 0944 01/24/21 0303  WBC 17.0* 18.0* 18.6*  NEUTROABS  --  15.3* 15.8*  HGB 7.9* 7.4* 7.3*  HCT 25.0* 23.0* 23.2*  MCV 100.0 99.1 100.4*  PLT 208 263 703    Basic Metabolic Panel: Recent Labs  Lab 02/04/2021 1703 01/22/21 0336 01/23/21 0944 01/24/21 0303  NA 137 136 132* 132*  K 3.6 3.7 4.0 4.4  CL 99 99 96* 95*  CO2 23 21* 21* 23  GLUCOSE 103* 99 184* 145*  BUN 106* 113* 125* 141*  CREATININE 5.41* 5.59* 5.47* 5.75*  CALCIUM 11.4* 11.5* 11.6* 11.6*  MG 2.2  --   --  2.4  PHOS 8.2* 8.0*  --   --      GFR: Estimated Creatinine Clearance: 16.5 mL/min (A) (by C-G formula based on SCr of 5.75 mg/dL (H)).  Liver Function Tests: Recent Labs  Lab 01/30/2021 1703 01/22/21 0336 01/24/21 0303  AST  --   --  22  ALT  --   --  15  ALKPHOS  --   --  68  BILITOT  --   --  1.2  PROT  --   --  7.6  ALBUMIN 2.7* 2.6* 2.4*    CBG: No results for input(s): GLUCAP in the last 168 hours.   Recent Results (from the past 240 hour(s))  SARS CORONAVIRUS 2 (TAT 6-24 HRS) Nasopharyngeal Nasopharyngeal Swab     Status: None   Collection Time: 01/19/21 12:21 PM   Specimen: Nasopharyngeal Swab  Result Value Ref Range Status   SARS Coronavirus 2 NEGATIVE NEGATIVE Final    Comment: (NOTE) SARS-CoV-2 target nucleic acids are NOT DETECTED.  The SARS-CoV-2 RNA is generally detectable in upper and lower respiratory specimens during the acute phase of infection. Negative results do not preclude SARS-CoV-2 infection, do not rule out co-infections with other pathogens, and should not be used as the sole basis for treatment or other patient management decisions. Negative results must be combined with clinical observations, patient history, and epidemiological information. The expected result is Negative.  Fact Sheet for Patients: SugarRoll.be  Fact Sheet for Healthcare Providers: https://www.woods-mathews.com/  This test is not yet approved or cleared by the [place] FDA and  has been authorized for detection and/or diagnosis of SARS-CoV-2 by FDA under an Emergency Use Authorization (EUA). This EUA will remain  in effect (meaning this test can be  used) for the duration of the COVID-19 declaration under Se ction 564(b)(1) of the Act, 21 U.S.C. section 360bbb-3(b)(1), unless the authorization is terminated or revoked sooner.  Performed at [facility], [place] [address], [place], [place] [address]   Culture, Urine     Status: Abnormal (Preliminary  result)   Collection Time: 01/22/21  8:42 AM   Specimen: Urine, Random  Result Value Ref Range Status   Specimen Description URINE, RANDOM  Final   Special Requests NONE  Final   Culture (A)  Final    >=100,000 COLONIES/mL GRAM NEGATIVE RODS CULTURE REINCUBATED FOR BETTER GROWTH SUSCEPTIBILITIES TO FOLLOW Performed at [facility], [address] [place], [place] [address]    Report Status PENDING  Incomplete  Culture, blood (routine x 2)     Status: None (Preliminary result)   Collection Time: 01/23/21  2:59 PM   Specimen: BLOOD RIGHT HAND  Result Value Ref Range Status   Specimen Description BLOOD RIGHT HAND  Final   Special Requests AEROBIC BOTTLE ONLY Blood Culture adequate volume  Final   Culture   Final    NO GROWTH < 24 HOURS Performed at [facility], [place] [address], [place], [place] [address]    Report Status PENDING  Incomplete  Culture, blood (routine x 2)     Status: None (Preliminary result)   Collection Time: 01/23/21  3:10 PM   Specimen: BLOOD RIGHT HAND  Result Value Ref Range Status   Specimen Description BLOOD RIGHT HAND  Final   Special Requests AEROBIC BOTTLE ONLY Blood Culture adequate volume  Final   Culture   Final    NO GROWTH < 24 HOURS Performed at [facility], [place] [address] [place], [place] [address]    Report Status PENDING  Incomplete         Radiology Studies: DG Shoulder Left  Result Date: 01/22/2021 CLINICAL DATA:  Acute left shoulder pain. EXAM: LEFT SHOULDER - 2+ VIEW COMPARISON:  None. FINDINGS: There is no evidence of fracture or dislocation. There is no evidence of arthropathy or other focal bone abnormality. Soft tissues are unremarkable. IMPRESSION: Negative. Electronically Signed   By: Marijo Conception M.D.   On: 01/22/2021 18:53        Scheduled Meds: . sodium chloride   Intravenous Once  . apixaban  5 mg Oral BID  . darbepoetin (ARANESP) injection - NON-DIALYSIS  150 mcg Subcutaneous Q Thu-1800  .  diclofenac Sodium  2 g Topical QID  . ferric citrate  420 mg Oral TID WC  . furosemide  80 mg Intravenous Once  . furosemide  80 mg Oral BID  . loratadine  10 mg Oral Daily  . metoprolol succinate  12.5 mg Oral Daily  . midodrine  15 mg Oral TID WC  . multivitamin with minerals  1 tablet Oral QPC breakfast  . pantoprazole  20 mg Oral Daily  . tamsulosin  0.4 mg Oral QPC supper  . vitamin B-12  1,000 mcg Oral Daily   Continuous Infusions: . cefTRIAXone (ROCEPHIN)  IV    . ferric gluconate (FERRLECIT/NULECIT) IV 250 mg (01/16/2021 1806)     LOS: 3 days   Time spent: 40mns Greater than 50% of this time was spent in counseling, explanation of diagnosis, planning of further management, and coordination of care.   Voice Recognition /Viviann Sparedictation system was used to create this note, attempts have been made to correct errors. Please contact the  author with questions and/or clarifications.   Florencia Reasons, MD PhD FACP [facility]  Available via Epic secure chat 7am-7pm for nonurgent issues Please page for urgent issues To page the attending provider between 7A-7P or the covering provider during after hours 7P-7A, please log into the web site [email or web address] and access using universal [place] password for that web site. If you do not have the password, please call the hospital operator.    01/24/2021, 2:08 PM

## 2021-01-25 ENCOUNTER — Encounter (HOSPITAL_COMMUNITY): Payer: Self-pay | Admitting: Internal Medicine

## 2021-01-25 ENCOUNTER — Inpatient Hospital Stay (HOSPITAL_COMMUNITY): Payer: 59

## 2021-01-25 DIAGNOSIS — I483 Typical atrial flutter: Secondary | ICD-10-CM | POA: Diagnosis not present

## 2021-01-25 DIAGNOSIS — N186 End stage renal disease: Secondary | ICD-10-CM | POA: Diagnosis not present

## 2021-01-25 DIAGNOSIS — E8779 Other fluid overload: Secondary | ICD-10-CM

## 2021-01-25 LAB — RENAL FUNCTION PANEL
Albumin: 2.3 g/dL — ABNORMAL LOW (ref 3.5–5.0)
Anion gap: 12 (ref 5–15)
BUN: 114 mg/dL — ABNORMAL HIGH (ref 8–23)
CO2: 24 mmol/L (ref 22–32)
Calcium: 11.2 mg/dL — ABNORMAL HIGH (ref 8.9–10.3)
Chloride: 98 mmol/L (ref 98–111)
Creatinine, Ser: 4.2 mg/dL — ABNORMAL HIGH (ref 0.61–1.24)
GFR, Estimated: 15 mL/min — ABNORMAL LOW (ref >60)
Glucose, Bld: 163 mg/dL — ABNORMAL HIGH (ref 70–99)
Phosphorus: 7.1 mg/dL — ABNORMAL HIGH (ref 2.5–4.6)
Potassium: 4.2 mmol/L (ref 3.5–5.1)
Sodium: 134 mmol/L — ABNORMAL LOW (ref 135–145)

## 2021-01-25 LAB — BLOOD CULTURE ID PANEL (REFLEXED) - BCID2

## 2021-01-25 LAB — CBC WITH DIFFERENTIAL/PLATELET
Abs Immature Granulocytes: 0.19 10*3/uL — ABNORMAL HIGH (ref 0.00–0.07)
Basophils Absolute: 0 10*3/uL (ref 0.0–0.1)
Basophils Relative: 0 %
Eosinophils Absolute: 0 10*3/uL (ref 0.0–0.5)
Eosinophils Relative: 0 %
HCT: 23.2 % — ABNORMAL LOW (ref 39.0–52.0)
Hemoglobin: 7.1 g/dL — ABNORMAL LOW (ref 13.0–17.0)
Immature Granulocytes: 1 %
Lymphocytes Relative: 8 %
Lymphs Abs: 1.5 10*3/uL (ref 0.7–4.0)
MCH: 30.9 pg (ref 26.0–34.0)
MCHC: 30.6 g/dL (ref 30.0–36.0)
MCV: 100.9 fL — ABNORMAL HIGH (ref 80.0–100.0)
Monocytes Absolute: 1.5 10*3/uL — ABNORMAL HIGH (ref 0.1–1.0)
Monocytes Relative: 7 %
Neutro Abs: 17 10*3/uL — ABNORMAL HIGH (ref 1.7–7.7)
Neutrophils Relative %: 84 %
Platelets: 254 10*3/uL (ref 150–400)
RBC: 2.3 MIL/uL — ABNORMAL LOW (ref 4.22–5.81)
RDW: 15.2 % (ref 11.5–15.5)
WBC: 20.3 10*3/uL — ABNORMAL HIGH (ref 4.0–10.5)
nRBC: 0 % (ref 0.0–0.2)

## 2021-01-25 LAB — TROPONIN I (HIGH SENSITIVITY): Troponin I (High Sensitivity): 64 ng/L — ABNORMAL HIGH (ref <18)

## 2021-01-25 LAB — URINE CULTURE: Culture: >=100000 — AB

## 2021-01-25 LAB — FOLATE: Folate: 8.6 ng/mL (ref >5.9)

## 2021-01-25 LAB — LACTIC ACID, PLASMA: Lactic Acid, Venous: 1.5 mmol/L (ref 0.5–1.9)

## 2021-01-25 LAB — VITAMIN B12: Vitamin B-12: 2884 pg/mL — ABNORMAL HIGH (ref 180–914)

## 2021-01-25 MED ORDER — VANCOMYCIN HCL 1000 MG/200ML IV SOLN
1000.0000 mg | INTRAVENOUS | Status: DC
  Administered 2021-01-26: 1000 mg via INTRAVENOUS
  Filled 2021-01-25: qty 200

## 2021-01-25 MED ORDER — AMIODARONE HCL IN DEXTROSE 360-4.14 MG/200ML-% IV SOLN
30.0000 mg/h | INTRAVENOUS | Status: AC
  Administered 2021-01-26 – 2021-01-29 (×7): 30 mg/h via INTRAVENOUS
  Filled 2021-01-25 (×7): qty 200

## 2021-01-25 MED ORDER — "THROMBI-PAD 3""X3"" EX PADS"
1.0000 | MEDICATED_PAD | Freq: Once | CUTANEOUS | Status: AC
  Administered 2021-01-25: 1 via TOPICAL
  Filled 2021-01-25: qty 1

## 2021-01-25 MED ORDER — VANCOMYCIN HCL 2000 MG/400ML IV SOLN
2000.0000 mg | INTRAVENOUS | Status: AC
  Administered 2021-01-25: 2000 mg via INTRAVENOUS
  Filled 2021-01-25: qty 400

## 2021-01-25 MED ORDER — PRISMASOL BGK 4/2.5 32-4-2.5 MEQ/L REPLACEMENT SOLN: Status: DC

## 2021-01-25 MED ORDER — HEPARIN (PORCINE) 2000 UNITS/L FOR CRRT
INTRAVENOUS_CENTRAL | Status: DC | PRN
  Filled 2021-01-25: qty 1000

## 2021-01-25 MED ORDER — AMIODARONE HCL IN DEXTROSE 360-4.14 MG/200ML-% IV SOLN
60.0000 mg/h | INTRAVENOUS | Status: AC
  Administered 2021-01-25 (×2): 60 mg/h via INTRAVENOUS
  Filled 2021-01-25 (×2): qty 200

## 2021-01-25 MED ORDER — PRISMASOL BGK 4/2.5 32-4-2.5 MEQ/L EC SOLN: Status: DC

## 2021-01-25 MED ORDER — HEPARIN SODIUM (PORCINE) 1000 UNIT/ML DIALYSIS
1000.0000 [IU] | INTRAMUSCULAR | Status: DC | PRN
  Administered 2021-01-26: 3000 [IU] via INTRAVENOUS_CENTRAL
  Filled 2021-01-25: qty 6
  Filled 2021-01-25: qty 4
  Filled 2021-01-25: qty 3
  Filled 2021-01-25: qty 6

## 2021-01-25 MED ORDER — AMIODARONE LOAD VIA INFUSION
150.0000 mg | Freq: Once | INTRAVENOUS | Status: AC
  Administered 2021-01-25: 150 mg via INTRAVENOUS
  Filled 2021-01-25: qty 83.34

## 2021-01-25 MED ORDER — VANCOMYCIN VARIABLE DOSE PER UNSTABLE RENAL FUNCTION (PHARMACIST DOSING): Status: DC

## 2021-01-25 NOTE — Progress Notes (Signed)
Pharmacy Antibiotic Note  Mark [name] is a 60 y.o. male with UTI and staph epi bacteremia.  Pharmacy has been consulted for Vancomycin dosing. Pt also on Rocephin for proteus penneri UTI. Pt with CKD stage V - now ESRD with HD earlier this admission.  Plan: Vancomycin 2gm IV now - further dosing based on HD plans Will f/u HD plans, micro data, and pt's clinical condition Vanc levels prn   Height: '5\' 9"'$  (175.3 cm) Weight: 108.5 kg (239 lb 3.2 oz) IBW/kg (Calculated) : 70.7  Temp (24hrs), Avg:97.8 F (36.6 C), Min:97.3 F (36.3 C), Max:98.9 F (37.2 C)  Recent Labs  Lab 01/25/2021 1703 01/22/21 0336 01/23/21 0944 01/23/21 1509 01/24/21 0303 01/24/21 2304  WBC  --  17.0* 18.0*  --  18.6* 19.8*  CREATININE 5.41* 5.59* 5.47*  --  5.75*  --   LATICACIDVEN  --   --   --  1.2  --   --     Estimated Creatinine Clearance: 16.4 mL/min (A) (by C-G formula based on SCr of 5.75 mg/dL (H)).    Allergies  Allergen Reactions  . Penicillins Swelling, Rash and Other (See Comments)    Arms and eyes swell & skin breaks out  Has patient had a PCN reaction causing immediate rash, facial/tongue/throat swelling, SOB or lightheadedness with hypotension: Yes Has patient had a PCN reaction causing severe rash involving mucus membranes or skin necrosis: Yes Has patient had a PCN reaction that required hospitalization: No Has patient had a PCN reaction occurring within the last 10 years: No If all of the above answers are "NO", then may proceed with Cephalosporin use.   . Atorvastatin     Muscle pain in legs  . Rosuvastatin     Muscle pain in legs    Antimicrobials this admission: 3/19 Ceftriaxone >>  3/19 Vanc >>   Microbiology results: 3/18 BCx: 2/2 staph epi (+mecA resistance) 3/17 UCx: proteus penneri  3/19 MRSA PCR: negative  Thank you for allowing pharmacy to be a part of this patient's care.  Sherlon Handing, PharmD, BCPS Please see amion for complete clinical pharmacist phone  list 01/25/2021 12:37 AM

## 2021-01-25 NOTE — Plan of Care (Signed)

## 2021-01-25 NOTE — Progress Notes (Signed)
NAME:  Mark [name], MRN:  [id number], DOB:  [birth year], LOS: 4 ADMISSION DATE:  01/24/2021, CONSULTATION DATE: 3/19 REFERRING MD:  Dr. Erlinda Hong, CHIEF COMPLAINT:  TRH    History of Present Illness:  60 y/o M who presented to [facility] on 3/16 with shortness of breath, leg swelling and palpitations.   The patient has known CKD IV with baseline sr cr of 5, conservative management, L arm AVF in place in anticipation of HD.  He was brought in by Cardiology 3/16 for elective cardioversion.  On presentation, he was found to have significant volume overload needing admission per TRH.  After cardioversion he became transiently hypotensive and briefly required neosynephrine gtt.  He was admitted and Nephrology was consulted. He was diuresed with 1.5L UOP. Urinalysis grew 100k proteus, sensitivities pending. He was started on rocephin.  The patient remained on minimal O2. He had intermittent complaints of shoulder / left chest pain.  There were concerns for possible blood in stool, FOBT pending.   During his initial iHD on 3/19 evening, the patient began having chest pain and shortness of breath approximately 45 minutes into the therapy with HR into the 140's.  He was placed on a NRB and HD was stopped. He was transferred back to his room and placed on BiPAP. ABG was obtained which showed an acute hypercarbic respiratory failure (7.154 / 71).  CXR showed marked cardiomegaly with basilar atelectasis. RRT was called to assess the patient.  Cardiology was consulted, he was given 120 mg IV lasix and recommended to be started on an amiodarone infusion.  Troponin was assessed and 70, lactic acid 1.2.  The patient was transferred to ICU for further observation.     Pertinent  Medical History  CKD IV - HD started this admit (02/02/2021) HTN HLD Chronic Diastolic CHF  Moderate AS Atrial Flutter - on Eliquis  Obesity  Anemia of Chronic Disease   Significant Hospital Events: Including procedures, antibiotic start and stop  dates in addition to other pertinent events   3/16 Admit after cardioversion, SOB, leg swelling / palpitations prior to presentation  3/19 PCCM consulted  3/20 CRRT started  Interim History / Subjective:   Remains on BiPAP, alert. No distress  Objective   Blood pressure 95/73, pulse 98, temperature (!) 96.5 F (35.8 C), temperature source Axillary, resp. rate (!) 28, height '5\' 9"'$  (1.753 m), weight 108.5 kg, SpO2 100 %.    FiO2 (%):  [30 %-40 %] 30 %   Intake/Output Summary (Last 24 hours) at 01/25/2021 0957 Last data filed at 01/25/2021 0900 Gross per 24 hour  Intake 2413.39 ml  Output 900 ml  Net 1513.39 ml   Filed Weights   01/24/21 0105 01/24/21 2100 01/25/21 0500  Weight: 109.6 kg 108.5 kg 108.5 kg    Examination: Blood pressure 97/76, pulse (!) 36, temperature (!) 96.5 F (35.8 C), temperature source Axillary, resp. rate 18, height '5\' 9"'$  (1.753 m), weight 108.5 kg, SpO2 99 %. Gen:      No acute distress, on BiPAP HEENT:  EOMI, sclera anicteric Neck:     No masses; no thyromegaly Lungs:    Clear to auscultation bilaterally; normal respiratory effort CV:         Regular rate and rhythm; no murmurs Abd:      + bowel sounds; soft, non-tender; no palpable masses, no distension Ext:    1+ edema; adequate peripheral perfusion Skin:      Warm and dry; no rash Neuro: Awake, alert  Lab/imaging reviewed    Resolved Hospital Problem list     Assessment & Plan:   Acute Hypercarbic Respiratory Failure in setting of Acute Diastolic CHF  Continue Bipap Hopefully to avoid intubation if he can get volume off with CRRT  CKD V / New ESRD  HD initiated this admit, did not tolerate initial HD due to chest pain Discussed with nephrology. He is still volume overloaded Attempt CRRT for removal  Acute Exacerbation of dCHF Atrial Flutter  Chronic Hypotension S/P DCCV 3/16 with brief hypotension requiring neo, had some chest pain then.  On eliquis for AFlutter.  Starting IV  amiodarone for control of atrial flutter Will need repeat cardioversion when more stable Continue Eliquis, midodrine   Anemia of Chronic Disease, ? Acute Component  Reported red stool, hx of gastric ulcer  -s/p PRBC 3/19 Follow CBC  BPH Continue flomax  Proteus UTI Chronic Right Thigh Wound  CNS bacteremia ? contaminant On ceftriaxone, vanco  Left Shoulder Pain  Follow up MRI   Best practice (evaluated daily)  Diet:  NPO Pain/Anxiety/Delirium protocol (if indicated): No VAP protocol (if indicated): Not indicated DVT prophylaxis: Systemic AC GI prophylaxis: N/A Glucose control:  Basal insulin No Central venous access:  Yes, and it is still needed Arterial line:  N/A Foley:  N/A Mobility:  bed rest  PT consulted: N/A Last date of multidisciplinary goals of care discussion:  Code Status:  full code Disposition: ICU   Critical care time:   The patient is critically ill with multiple organ system failure and requires high complexity decision making for assessment and support, frequent evaluation and titration of therapies, advanced monitoring, review of radiographic studies and interpretation of complex data.   Critical Care Time devoted to patient care services, exclusive of separately billable procedures, described in this note is 35 minutes.   Marshell Garfinkel MD [facility] See Amion for pager  If no response to pager , please call [phone] until 7pm After 7:00 pm call Elink  [phone] 01/25/2021, 9:57 AM  Please see [email or web address] for pager details.   From 7A-7P if no response, please call [phone] After hours, please call ELink [phone]

## 2021-01-25 NOTE — Progress Notes (Addendum)
[facility] NEPHROLOGY PROGRESS NOTE  Assessment/ Plan: Pt is a 60 y.o. yo male  With PMH of HTN, DM, HLD, obesity, CKD stage V baseline creatinine level around 5 admitted with acute diastolic CHF.  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  Started HD yesterday however around 45 minutes into the treatment patient developed chest pain, tachycardia and worsening hypoxia.  Rapid response called and later patient was transferred to ICU.  He is grossly volume overload, systolic blood pressure around 90s and currently on BiPAP.  Unable to ultrafiltrate with IHD therefore plan to start CRRT mainly for ultrafiltration and azotemia.  Patient agreed with placing temporary HD catheter and doing CRRT. All 4K bath, only limited heparin because of anemia, UF as tolerated by BP.  Patient may need small dose of pressors.  Discussed with ICU nurse and Dr. Vaughan Browner. He has a left radiocephalic fistula that was created in October, 2017 by Dr. Scot Dock that is mature, able to use for IHD.  #Acute exacerbation of diastolic heart failure:  Starting CRRT mainly for ultrafiltration.  Follow with Dr Posey Pronto at [facility].  # Hypertension: On metoprolol for atrial flutter.  Given low blood pressure today he may need pressures to support ultrafiltration.  # Anemia of chronic disease: Iron deficiency therefore ordered IV iron and ESA on 3/17.  Continue ESA.  Transfuse a unit of blood today.  # Secondary hyperparathyroidism: Both calcium and phosphorus level elevated.  Discontinued MVA and hold any vitamin D.  Increased Auryxia yesterday but currently n.p.o.  Now expect to improve with RRT.  Subjective: Seen and examined.  Overnight event noted.  Did not tolerate intermittent hemodialysis and later transferred to ICU.  Currently on BiPAP.  Patient is alert awake and understands current situation.  He looks tired.  Urine output only 1.2 L with higher dose of IV  diuretics. Objective Vital signs in last 24 hours: Vitals:   01/25/21 0400 01/25/21 0500 01/25/21 0600 01/25/21 0700  BP: 91/68 93/71 (!) 86/64 98/79  Pulse: 96 93 80   Resp: (!) 21 19 (!) 21 20  Temp:    (!) 96.5 F (35.8 C)  TempSrc:    Axillary  SpO2: 100% 100% 100%   Weight:  108.5 kg    Height:       Weight change: -1.134 kg  Intake/Output Summary (Last 24 hours) at 01/25/2021 0817 Last data filed at 01/25/2021 0500 Gross per 24 hour  Intake 2333.39 ml  Output 900 ml  Net 1433.39 ml       Labs: Basic Metabolic Panel: Recent Labs  Lab 01/11/2021 1703 01/22/21 0336 01/23/21 0944 01/24/21 0303 01/24/21 2254  NA 137 136 132* 132* 135  K 3.6 3.7 4.0 4.4 4.5  CL 99 99 96* 95*  --   CO2 23 21* 21* 23  --   GLUCOSE 103* 99 184* 145*  --   BUN 106* 113* 125* 141*  --   CREATININE 5.41* 5.59* 5.47* 5.75*  --   CALCIUM 11.4* 11.5* 11.6* 11.6*  --   PHOS 8.2* 8.0*  --   --   --    Liver Function Tests: Recent Labs  Lab 01/06/2021 1703 01/22/21 0336 01/24/21 0303  AST  --   --  22  ALT  --   --  15  ALKPHOS  --   --  68  BILITOT  --   --  1.2  PROT  --   --  7.6  ALBUMIN 2.7* 2.6* 2.4*   No results for input(s): LIPASE, AMYLASE in the last 168 hours. No results for input(s): AMMONIA in the last 168 hours. CBC: Recent Labs  Lab 01/22/21 0336 01/23/21 0944 01/24/21 0303 01/24/21 2254 01/24/21 2304 01/25/21 0132  WBC 17.0* 18.0* 18.6*  --  19.8* 20.3*  NEUTROABS  --  15.3* 15.8*  --   --  17.0*  HGB 7.9* 7.4* 7.3* 7.8* 6.9* 7.1*  HCT 25.0* 23.0* 23.2* 23.0* 21.9* 23.2*  MCV 100.0 99.1 100.4*  --  100.9* 100.9*  PLT 208 263 256  --  278 254   Cardiac Enzymes: Recent Labs  Lab 01/24/21 0303  CKTOTAL 22*   CBG: No results for input(s): GLUCAP in the last 168 hours.  Iron Studies:  No results for input(s): IRON, TIBC, TRANSFERRIN, FERRITIN in the last 72 hours. Studies/Results: DG Chest Port 1 View  Result Date: 01/24/2021 CLINICAL DATA:   Respiratory distress EXAM: PORTABLE CHEST 1 VIEW COMPARISON:  01/22/2021 FINDINGS: Markedly enlarged cardiac pericardial silhouette, potentially worsened from 01/22/2021 although some of the difference is probably projectional given that today's exam is obtained AP and the prior exam was PA. Stable scarring or atelectasis in the lingula. Mildly increased atelectasis along the left hemidiaphragm. Stable scarring or atelectasis along the right hemidiaphragm. IMPRESSION: 1. Markedly enlarged cardiac pericardial silhouette, probably overall similar to 10/09/2020 and 01/22/2021 when AP projection is taken into account. 2. Bibasilar and left midlung scarring versus atelectasis. Electronically Signed   By: Van Clines M.D.   On: 01/24/2021 19:12    Medications: Infusions: . cefTRIAXone (ROCEPHIN)  IV 1 g (01/24/21 1526)  . ferric gluconate (FERRLECIT/NULECIT) IV 250 mg (01/10/2021 1806)  . furosemide Stopped (01/25/21 0452)    Scheduled Medications: . sodium chloride   Intravenous Once  . apixaban  5 mg Oral BID  . Chlorhexidine Gluconate Cloth  6 each Topical Q0600  . darbepoetin (ARANESP) injection - NON-DIALYSIS  150 mcg Subcutaneous Q Thu-1800  . diclofenac Sodium  2 g Topical QID  . ferric citrate  630 mg Oral TID WC  . loratadine  10 mg Oral Daily  . metoprolol succinate  12.5 mg Oral Daily  . midodrine  15 mg Oral TID WC  . pantoprazole  20 mg Oral Daily  . tamsulosin  0.4 mg Oral QPC supper  . vancomycin variable dose per unstable renal function (pharmacist dosing)   Does not apply See admin instructions  . vitamin B-12  1,000 mcg Oral Daily    have reviewed scheduled and prn medications.  Physical Exam: General: With BiPAP, alert awake  Heart:RRR, s1s2 nl Lungs: Bilateral crackles, no wheeze Abdomen:soft, Non-tender, distended Extremities: Bilateral lower extremity edema ++ Dialysis Access: AV fistula has good thrill and bruit  Markeeta Scalf Prasad Veneta Sliter 01/25/2021,8:17 AM  LOS:  4 days

## 2021-01-25 NOTE — Progress Notes (Signed)
Pharmacy Antibiotic Note  Mark [name] is a 60 y.o. male with UTI and staph epi bacteremia.  Pharmacy has been consulted for Vancomycin dosing. Pt also on Rocephin for proteus penneri UTI. Pt with CKD stage V - now ESRD with HD earlier this admission. Started CVVHD 3/20 will adjust vancomycin dose   Plan: Vancomycin 2gm x1 given earlier today will begin  1gm IV q24h tomorrow    Height: '5\' 9"'$  (175.3 cm) Weight: 108.5 kg (239 lb 3.2 oz) IBW/kg (Calculated) : 70.7  Temp (24hrs), Avg:97.5 F (36.4 C), Min:96.5 F (35.8 C), Max:98.3 F (36.8 C)  Recent Labs  Lab 01/08/2021 1703 01/22/21 0336 01/23/21 0944 01/23/21 1509 01/24/21 0303 01/24/21 2304 01/25/21 0132  WBC  --  17.0* 18.0*  --  18.6* 19.8* 20.3*  CREATININE 5.41* 5.59* 5.47*  --  5.75*  --   --   LATICACIDVEN  --   --   --  1.2  --   --  1.5    Estimated Creatinine Clearance: 16.4 mL/min (A) (by C-G formula based on SCr of 5.75 mg/dL (H)).    Allergies  Allergen Reactions  . Penicillins Swelling, Rash and Other (See Comments)    Arms and eyes swell & skin breaks out  Has patient had a PCN reaction causing immediate rash, facial/tongue/throat swelling, SOB or lightheadedness with hypotension: Yes Has patient had a PCN reaction causing severe rash involving mucus membranes or skin necrosis: Yes Has patient had a PCN reaction that required hospitalization: No Has patient had a PCN reaction occurring within the last 10 years: No If all of the above answers are "NO", then may proceed with Cephalosporin use.   . Atorvastatin     Muscle pain in legs  . Rosuvastatin     Muscle pain in legs    Antimicrobials this admission: 3/19 Ceftriaxone >>  3/19 Vanc >>   Microbiology results: 3/18 BCx: 2/2 staph epi (+mecA resistance) 3/17 UCx: proteus penneri  3/19 MRSA PCR: negative   Bonnita Nasuti Pharm.D. CPP, BCPS Clinical Pharmacist [phone] 01/25/2021 3:21 PM    Please see amion for complete clinical  pharmacist phone list 01/25/2021 3:19 PM

## 2021-01-25 NOTE — Progress Notes (Signed)
Progress Note  Patient Name: Mark [name] Date of Encounter: 01/25/2021  Primary Cardiologist: [name], MD  Subjective   Patient currently on BiPAP.  Dialysis access catheter placed for CRRT.  Inpatient Medications    Scheduled Meds: . sodium chloride   Intravenous Once  . apixaban  5 mg Oral BID  . Chlorhexidine Gluconate Cloth  6 each Topical Q0600  . darbepoetin (ARANESP) injection - NON-DIALYSIS  150 mcg Subcutaneous Q Thu-1800  . diclofenac Sodium  2 g Topical QID  . ferric citrate  630 mg Oral TID WC  . loratadine  10 mg Oral Daily  . metoprolol succinate  12.5 mg Oral Daily  . midodrine  15 mg Oral TID WC  . pantoprazole  20 mg Oral Daily  . tamsulosin  0.4 mg Oral QPC supper  . vancomycin variable dose per unstable renal function (pharmacist dosing)   Does not apply See admin instructions  . vitamin B-12  1,000 mcg Oral Daily   Continuous Infusions: .  prismasol BGK 4/2.5    .  prismasol BGK 4/2.5    . cefTRIAXone (ROCEPHIN)  IV 1 g (01/24/21 1526)  . ferric gluconate (FERRLECIT/NULECIT) IV 250 mg (01/18/2021 1806)  . furosemide Stopped (01/25/21 0452)  . prismasol BGK 4/2.5     PRN Meds: acetaminophen **OR** acetaminophen, heparin, heparin, Muscle Rub, polyethylene glycol, traMADol   Vital Signs    Vitals:   01/25/21 0800 01/25/21 0850 01/25/21 0900 01/25/21 1000  BP: '95/73 95/73 97/77 '$   Pulse:  98  (!) 36  Resp: 16 (!) '28 20 17  '$ Temp:      TempSrc:      SpO2: 100% 100%  99%  Weight:      Height:        Intake/Output Summary (Last 24 hours) at 01/25/2021 1045 Last data filed at 01/25/2021 0900 Gross per 24 hour  Intake 2413.39 ml  Output 900 ml  Net 1513.39 ml   Filed Weights   01/24/21 0105 01/24/21 2100 01/25/21 0500  Weight: 109.6 kg 108.5 kg 108.5 kg    Telemetry    Atrial flutter with variable block.  Personally reviewed.  ECG    No ECG reviewed.  Physical Exam   GEN: No acute distress.  Currently on BiPAP. Neck:   Difficult to assess JVP. Cardiac:  Irregular, no gallop.  Respiratory:  Scattered crackles, no wheezing. GI:  Protuberant, nontender, bowel sounds present. MS:  AV fistula with bruit and thrill.  Bilateral lower leg edema. Neuro:  Nonfocal.  Labs    Chemistry Recent Labs  Lab 01/19/2021 1703 01/22/21 0336 01/23/21 0944 01/24/21 0303 01/24/21 2254  NA 137 136 132* 132* 135  K 3.6 3.7 4.0 4.4 4.5  CL 99 99 96* 95*  --   CO2 23 21* 21* 23  --   GLUCOSE 103* 99 184* 145*  --   BUN 106* 113* 125* 141*  --   CREATININE 5.41* 5.59* 5.47* 5.75*  --   CALCIUM 11.4* 11.5* 11.6* 11.6*  --   PROT  --   --   --  7.6  --   ALBUMIN 2.7* 2.6*  --  2.4*  --   AST  --   --   --  22  --   ALT  --   --   --  15  --   ALKPHOS  --   --   --  68  --   BILITOT  --   --   --  1.2  --   GFRNONAA 11* 11* 11* 10*  --   ANIONGAP 15 16* 15 14  --      Hematology Recent Labs  Lab 01/24/21 0303 01/24/21 2254 01/24/21 2304 01/25/21 0132  WBC 18.6*  --  19.8* 20.3*  RBC 2.31*  --  2.17* 2.30*  HGB 7.3* 7.8* 6.9* 7.1*  HCT 23.2* 23.0* 21.9* 23.2*  MCV 100.4*  --  100.9* 100.9*  MCH 31.6  --  31.8 30.9  MCHC 31.5  --  31.5 30.6  RDW 14.9  --  15.2 15.2  PLT 256  --  278 254    Cardiac Enzymes Recent Labs  Lab 01/24/21 1907 01/24/21 2304  TROPONINIHS 70* 87*    Radiology    DG Chest Port 1 View  Result Date: 01/24/2021 CLINICAL DATA:  Respiratory distress EXAM: PORTABLE CHEST 1 VIEW COMPARISON:  01/22/2021 FINDINGS: Markedly enlarged cardiac pericardial silhouette, potentially worsened from 01/22/2021 although some of the difference is probably projectional given that today's exam is obtained AP and the prior exam was PA. Stable scarring or atelectasis in the lingula. Mildly increased atelectasis along the left hemidiaphragm. Stable scarring or atelectasis along the right hemidiaphragm. IMPRESSION: 1. Markedly enlarged cardiac pericardial silhouette, probably overall similar to 10/09/2020  and 01/22/2021 when AP projection is taken into account. 2. Bibasilar and left midlung scarring versus atelectasis. Electronically Signed   By: Van Clines M.D.   On: 01/24/2021 19:12    Cardiac Studies   Echocardiogram 10/09/2020: 1. No subcostal views.  2. Left ventricular ejection fraction, by estimation, is 60 to 65%. The  left ventricle has normal function. The left ventricle has no regional  wall motion abnormalities. There is mild left ventricular hypertrophy.  Left ventricular diastolic parameters  are indeterminate.  3. Right ventricular systolic function is normal. The right ventricular  size is normal.  4. Left atrial size was severely dilated.  5. The mitral valve is normal in structure. Mild mitral valve  regurgitation. No evidence of mitral stenosis. Severe mitral annular  calcification.  6. The aortic valve is normal in structure. Aortic valve regurgitation is  trivial. Moderate aortic valve stenosis.   DCCV 01/24/2021 Evaluation: Findings: Post procedure EKG shows:Sinus with mobitz 1 AVB and PACs Complications:None  After the procedure, the patient was hypotensive requiring pressor support. Was also notably overloaded on physical exam. Given tenuous status, decision was made to admit to in-patient for pressor support and IV diuresis. Plan discussed with admitting physician, Dr. Ellyn Hack and primary cardiologist Dr. Margaretann Loveless.  Patient Profile     60 y.o. male with a history of moderate AS, CKD V, HTN, gout, persistent atrial flutter, DM2, and hx of hip dislocations and recent acute on chronic diastolic CHF with acute respiratory failure>> found to be in atrial flutter. Plan was for OP DCCV>>scheduled for 01/22/21. On arrival pt found to be severely volume overloaded.  Assessment & Plan    1.  Typical atrial flutter with variable block status post cardioversion in March 16, initially unsuccessful however with return of atrial flutter within a few hours.   Currently on Eliquis for stroke prophylaxis, also Toprol-XL.  2.  CKD stage V progressed to ESRD.  Did not tolerate initial HD yesterday due to chest discomfort and tachycardia as well as subsequent hypoxic respiratory failure.  Plan at this point is for CRRT per nephrology.  He has continued evidence of fluid overload.  3.  Relative hypotension.  Currently on midodrine.  4.  Anemia  of chronic disease.  Hemoglobin 7.1.  5.  Moderate aortic stenosis, last assessed in December 2021.  Plan to initiate IV amiodarone.  May assist with rate control of atrial flutter as he undergoes further volume removal in the setting of relative hypotension.  Would not necessarily plan on a follow-up cardioversion attempt at this time until he is more stabilized.  Ultimately however, amiodarone load may help keep him in sinus rhythm with future cardioversion attempt.  Continue Eliquis for now.  We will continue to follow.  Signed, Rozann Lesches, MD  01/25/2021, 10:45 AM

## 2021-01-25 NOTE — Procedures (Addendum)
Central Venous Catheter Insertion Procedure Note  Mark [name]  [id number]  04-02-1961  Date:01/25/21  Time:10:52 AM   Provider Performing:Havish Petties   Procedure: Insertion of Non-tunneled Central Venous Catheter(36556)with US guidance [id number])    Indication(s) Hemodialysis  Consent Risks of the procedure as well as the alternatives and risks of each were explained to the patient and/or caregiver.  Consent for the procedure was obtained and is signed in the bedside chart  Anesthesia Topical only with 1% lidocaine   Timeout Verified patient identification, verified procedure, site/side was marked, verified correct patient position, special equipment/implants available, medications/allergies/relevant history reviewed, required imaging and test results available.  Sterile Technique Maximal sterile technique including full sterile barrier drape, hand hygiene, sterile gown, sterile gloves, mask, hair covering, sterile ultrasound probe cover (if used).  Procedure Description Area of catheter insertion was cleaned with chlorhexidine and draped in sterile fashion.   With real-time ultrasound guidance a HD catheter was placed into the right internal jugular vein.  [place] was used to visualize the veins and provide real time guidance for cannulation. Nonpulsatile blood flow and easy flushing noted in all ports.  The catheter was sutured in place and sterile dressing applied.  Complications/Tolerance None; patient tolerated the procedure well.   Chest X-ray reviewed. Cath tip is in the expected position in SVC. No pneumothorax  EBL Minimal  Specimen(s) None      Marshell Garfinkel MD [facility] See Amion for pager  If no response to pager , please call [phone] until 7pm After 7:00 pm call Elink  [phone] 01/25/2021, 10:52 AM

## 2021-01-26 ENCOUNTER — Encounter (HOSPITAL_COMMUNITY): Admission: AD | Disposition: E | Payer: Self-pay | Source: Home / Self Care | Attending: Pulmonary Disease

## 2021-01-26 ENCOUNTER — Inpatient Hospital Stay (HOSPITAL_COMMUNITY): Payer: 59

## 2021-01-26 DIAGNOSIS — I959 Hypotension, unspecified: Secondary | ICD-10-CM

## 2021-01-26 DIAGNOSIS — L02215 Cutaneous abscess of perineum: Secondary | ICD-10-CM

## 2021-01-26 DIAGNOSIS — I4892 Unspecified atrial flutter: Secondary | ICD-10-CM | POA: Diagnosis not present

## 2021-01-26 DIAGNOSIS — J9602 Acute respiratory failure with hypercapnia: Secondary | ICD-10-CM | POA: Diagnosis not present

## 2021-01-26 DIAGNOSIS — E8779 Other fluid overload: Secondary | ICD-10-CM | POA: Diagnosis not present

## 2021-01-26 DIAGNOSIS — N186 End stage renal disease: Secondary | ICD-10-CM | POA: Diagnosis not present

## 2021-01-26 LAB — CBC WITH DIFFERENTIAL/PLATELET
Abs Immature Granulocytes: 0.32 10*3/uL — ABNORMAL HIGH (ref 0.00–0.07)
Basophils Absolute: 0 10*3/uL (ref 0.0–0.1)
Basophils Relative: 0 %
Eosinophils Absolute: 0.1 10*3/uL (ref 0.0–0.5)
Eosinophils Relative: 1 %
HCT: 22.3 % — ABNORMAL LOW (ref 39.0–52.0)
Hemoglobin: 7.1 g/dL — ABNORMAL LOW (ref 13.0–17.0)
Immature Granulocytes: 2 %
Lymphocytes Relative: 5 %
Lymphs Abs: 1.1 10*3/uL (ref 0.7–4.0)
MCH: 31.8 pg (ref 26.0–34.0)
MCHC: 31.8 g/dL (ref 30.0–36.0)
MCV: 100 fL (ref 80.0–100.0)
Monocytes Absolute: 1.3 10*3/uL — ABNORMAL HIGH (ref 0.1–1.0)
Monocytes Relative: 6 %
Neutro Abs: 18.4 10*3/uL — ABNORMAL HIGH (ref 1.7–7.7)
Neutrophils Relative %: 86 %
Platelets: 278 10*3/uL (ref 150–400)
RBC: 2.23 MIL/uL — ABNORMAL LOW (ref 4.22–5.81)
RDW: 15.2 % (ref 11.5–15.5)
WBC: 21.3 10*3/uL — ABNORMAL HIGH (ref 4.0–10.5)
nRBC: 0.2 % (ref 0.0–0.2)

## 2021-01-26 LAB — RENAL FUNCTION PANEL
Albumin: 2.3 g/dL — ABNORMAL LOW (ref 3.5–5.0)
Albumin: 2.4 g/dL — ABNORMAL LOW (ref 3.5–5.0)
Anion gap: 12 (ref 5–15)
Anion gap: 10 (ref 5–15)
BUN: 74 mg/dL — ABNORMAL HIGH (ref 8–23)
BUN: 52 mg/dL — ABNORMAL HIGH (ref 8–23)
CO2: 23 mmol/L (ref 22–32)
CO2: 24 mmol/L (ref 22–32)
Calcium: 10.7 mg/dL — ABNORMAL HIGH (ref 8.9–10.3)
Calcium: 11.2 mg/dL — ABNORMAL HIGH (ref 8.9–10.3)
Chloride: 98 mmol/L (ref 98–111)
Chloride: 98 mmol/L (ref 98–111)
Creatinine, Ser: 3.2 mg/dL — ABNORMAL HIGH (ref 0.61–1.24)
Creatinine, Ser: 2.6 mg/dL — ABNORMAL HIGH (ref 0.61–1.24)
GFR, Estimated: 21 mL/min — ABNORMAL LOW (ref >60)
GFR, Estimated: 27 mL/min — ABNORMAL LOW (ref >60)
Glucose, Bld: 131 mg/dL — ABNORMAL HIGH (ref 70–99)
Glucose, Bld: 160 mg/dL — ABNORMAL HIGH (ref 70–99)
Phosphorus: 4.6 mg/dL (ref 2.5–4.6)
Phosphorus: 4.9 mg/dL — ABNORMAL HIGH (ref 2.5–4.6)
Potassium: 4.4 mmol/L (ref 3.5–5.1)
Potassium: 4.5 mmol/L (ref 3.5–5.1)
Sodium: 133 mmol/L — ABNORMAL LOW (ref 135–145)
Sodium: 132 mmol/L — ABNORMAL LOW (ref 135–145)

## 2021-01-26 LAB — TROPONIN I (HIGH SENSITIVITY): Troponin I (High Sensitivity): 41 ng/L — ABNORMAL HIGH (ref <18)

## 2021-01-26 LAB — POCT I-STAT 7, (LYTES, BLD GAS, ICA,H+H)
Acid-Base Excess: 1 mmol/L (ref 0.0–2.0)
Bicarbonate: 24.9 mmol/L (ref 20.0–28.0)
Calcium, Ion: 1.44 mmol/L — ABNORMAL HIGH (ref 1.15–1.40)
HCT: 23 % — ABNORMAL LOW (ref 39.0–52.0)
Hemoglobin: 7.8 g/dL — ABNORMAL LOW (ref 13.0–17.0)
O2 Saturation: 99 %
Potassium: 4.4 mmol/L (ref 3.5–5.1)
Sodium: 136 mmol/L (ref 135–145)
TCO2: 26 mmol/L (ref 22–32)
pCO2 arterial: 37.6 mmHg (ref 32.0–48.0)
pH, Arterial: 7.43 (ref 7.350–7.450)
pO2, Arterial: 122 mmHg — ABNORMAL HIGH (ref 83.0–108.0)

## 2021-01-26 LAB — APTT: aPTT: 69 seconds — ABNORMAL HIGH (ref 24–36)

## 2021-01-26 LAB — MAGNESIUM: Magnesium: 2.2 mg/dL (ref 1.7–2.4)

## 2021-01-26 SURGERY — CARDIOVERSION
Anesthesia: General

## 2021-01-26 MED ORDER — HEPARIN (PORCINE) 25000 UT/250ML-% IV SOLN
1800.0000 [IU]/h | INTRAVENOUS | Status: AC
  Administered 2021-01-26 – 2021-01-27 (×2): 1400 [IU]/h via INTRAVENOUS
  Administered 2021-01-27: 1600 [IU]/h via INTRAVENOUS
  Administered 2021-01-28: 1800 [IU]/h via INTRAVENOUS
  Filled 2021-01-26 (×4): qty 250

## 2021-01-26 MED ORDER — CLINDAMYCIN PHOSPHATE 1 % EX GEL
Freq: Two times a day (BID) | CUTANEOUS | Status: DC
  Administered 2021-02-02: 1 via TOPICAL
  Filled 2021-01-26 (×2): qty 30

## 2021-01-26 MED ORDER — SODIUM CHLORIDE 0.9 % IV SOLN
2.0000 g | Freq: Two times a day (BID) | INTRAVENOUS | Status: DC
  Administered 2021-01-26 – 2021-01-27 (×3): 2 g via INTRAVENOUS
  Filled 2021-01-26 (×3): qty 2

## 2021-01-26 MED ORDER — SODIUM CHLORIDE 0.9% FLUSH: 10.0000 mL | INTRAVENOUS | Status: DC | PRN

## 2021-01-26 MED ORDER — SODIUM CHLORIDE 0.9% FLUSH
10.0000 mL | Freq: Two times a day (BID) | INTRAVENOUS | Status: DC
  Administered 2021-01-26 – 2021-02-01 (×11): 10 mL
  Administered 2021-02-01: 30 mL
  Administered 2021-02-02 – 2021-02-04 (×5): 10 mL
  Administered 2021-02-04: 20 mL
  Administered 2021-02-05: 10 mL

## 2021-01-26 NOTE — Progress Notes (Signed)
St elevation noted on monitor. EKG done at this time. ST elevation noted, EKG report states STEMI.  Reported to Charge nurse Adonis Huguenin, RN.  Cardiology paged.  Troponin added on to AM labs.  Pt denies CP, no N/V, not diaphoretic. BP 108/76, HR 118, 100% on BIpap at 30%

## 2021-01-26 NOTE — Progress Notes (Signed)
° ° °  Called by RN with concern of EKG showing concern for STEMI. Telemetry this morning showed possible ST elevation. EKG check showed ST with ST changes in the lead II, aVF. Talking with the patient he reports some mild chest discomfort but mostly difficulty with shortness of breath. Remains on Bipap. EKG reviewed with STEMI MD and EKG appears to be more consistent with PR depression in lead II when compared to prior EKG back on 3/16 in the setting of acute CHF along with other co-morbidities. Given his tenuous state will hold his Eliquis and transition to IV heparin in the event acute ischemic evaluation is needed. Will relay update to day rounding team.   Signed, Reino Bellis, NP-C 01/18/2021, 7:41 AM Pager: [phone]

## 2021-01-26 NOTE — Progress Notes (Signed)
OT Cancellation Note  Patient Details Name: AOI FREHNER MRN: [id number] DOB: 08-15-1961   Cancelled Treatment:    Reason Eval/Treat Not Completed: Patient not medically ready (new STEMI per monitor await labs and cardiology clearance)  Merri Ray Aalia Greulich 02/03/2021, 8:29 AM   [place] Pager: [phone] Office: [phone]

## 2021-01-26 NOTE — Progress Notes (Signed)
PT Cancellation Note  Patient Details Name: AERYK MCIRVIN MRN: [id number] DOB: 01-19-1961   Cancelled Treatment:    Reason Eval/Treat Not Completed: Patient not medically ready (new STEMI per monitor await labs and cardiology clearance)   Jaret Coppedge B Erminie Foulks 01/14/2021, 6:55 AM  [name], PT [facility] Pager: [phone] Office: [phone]

## 2021-01-26 NOTE — Progress Notes (Signed)
Progress Note  Patient Name: Mark [name] Date of Encounter: 02/02/2021  [facility] Cardiologist: [name], MD   Subjective   Conversationally dyspneic but saturating well on [place] this AM, has been off bipap for 15-20 min on my interview. Denies chest pain. Feels that breathing is slightly improving.   Inpatient Medications    Scheduled Meds: . Chlorhexidine Gluconate Cloth  6 each Topical Q0600  . darbepoetin (ARANESP) injection - NON-DIALYSIS  150 mcg Subcutaneous Q Thu-1800  . diclofenac Sodium  2 g Topical QID  . ferric citrate  630 mg Oral TID WC  . loratadine  10 mg Oral Daily  . midodrine  15 mg Oral TID WC  . pantoprazole  20 mg Oral Daily  . tamsulosin  0.4 mg Oral QPC supper  . vitamin B-12  1,000 mcg Oral Daily   Continuous Infusions: .  prismasol BGK 4/2.5 500 mL/hr at 01/23/2021 0808  .  prismasol BGK 4/2.5 300 mL/hr at 01/13/2021 0516  . amiodarone 30 mg/hr (01/30/2021 0900)  . ceFEPime (MAXIPIME) IV    . ferric gluconate (FERRLECIT/NULECIT) IV 250 mg (01/20/2021 1806)  . heparin 1,400 Units/hr (01/27/2021 0900)  . prismasol BGK 4/2.5 1,500 mL/hr at 01/08/2021 0808  . vancomycin 1,000 mg (01/10/2021 0921)   PRN Meds: acetaminophen **OR** acetaminophen, heparin, heparin, Muscle Rub, polyethylene glycol, traMADol   Vital Signs    Vitals:   02/04/2021 0748 01/16/2021 0800 01/28/2021 0852 01/28/2021 0900  BP:  97/67  111/64  Pulse: (!) 119  (!) 102   Resp:  (!) 25 (!) 24 (!) 26  Temp: 100.2 F (37.9 C)     TempSrc: Axillary     SpO2: 100% 100% 94%   Weight:      Height:        Intake/Output Summary (Last 24 hours) at 01/11/2021 0925 Last data filed at 01/28/2021 [id number] Gross per 24 hour  Intake 774.67 ml  Output 4685 ml  Net -3910.33 ml   Last 3 Weights 01/07/2021 01/25/2021 01/24/2021  Weight (lbs) 238 lb 12.1 oz 239 lb 3.2 oz 239 lb 3.2 oz  Weight (kg) 108.3 kg 108.5 kg 108.5 kg      Telemetry    Prior was aflutter with variable block, since starting  amiodarone more consistent with coarse flutter vs atrial fib - Personally Reviewed  ECG    Atrial flutter with diffuse PR depression and borderline ST elevation lead II - Personally Reviewed  Physical Exam   GEN: ill appearing, slightly tachypneic. On CVVH and [place]   Neck: R IJ dialysis catheter in place Cardiac: tachycardic, largely regular, no murmurs, rubs, or gallops appreciated.  Respiratory: Coarse anterior and lateral fields GI: Soft, nontender, non-distended  MS: +AV fistula. 1+ bilateral LE edema; No deformity. Neuro:  Nonfocal  Psych: Normal affect   Labs    High Sensitivity Troponin:   Recent Labs  Lab 01/24/21 1907 01/24/21 2304 01/14/2021 0450  TROPONINIHS 70* 64* 41*      Chemistry Recent Labs  Lab 01/24/21 0303 01/24/21 2254 01/25/21 1541 02/04/2021 0430 01/16/2021 0808  NA 132*   < > 134* 133* 136  K 4.4   < > 4.2 4.4 4.4  CL 95*  --  98 98  --   CO2 23  --  24 23  --   GLUCOSE 145*  --  163* 131*  --   BUN 141*  --  114* 74*  --   CREATININE 5.75*  --  4.20* 3.20*  --   CALCIUM 11.6*  --  11.2* 10.7*  --   PROT 7.6  --   --   --   --   ALBUMIN 2.4*  --  2.3* 2.3*  --   AST 22  --   --   --   --   ALT 15  --   --   --   --   ALKPHOS 68  --   --   --   --   BILITOT 1.2  --   --   --   --   GFRNONAA 10*  --  15* 21*  --   ANIONGAP 14  --  12 12  --    < > = values in this interval not displayed.     Hematology Recent Labs  Lab 01/24/21 2304 01/25/21 0132 01/23/2021 0430 02/03/2021 0808  WBC 19.8* 20.3* 21.3*  --   RBC 2.17* 2.30* 2.23*  --   HGB 6.9* 7.1* 7.1* 7.8*  HCT 21.9* 23.2* 22.3* 23.0*  MCV 100.9* 100.9* 100.0  --   MCH 31.8 30.9 31.8  --   MCHC 31.5 30.6 31.8  --   RDW 15.2 15.2 15.2  --   PLT 278 254 278  --     BNPNo results for input(s): BNP, PROBNP in the last 168 hours.   DDimer No results for input(s): DDIMER in the last 168 hours.   Radiology    DG CHEST PORT 1 VIEW  Result Date: 01/16/2021 CLINICAL DATA:  Respiratory  failure EXAM: PORTABLE CHEST 1 VIEW COMPARISON:  01/25/2021 FINDINGS: Central line unchanged with tip at high SVC. Patient rotated right. Marked cardiomegaly. No pleural effusion or pneumothorax. The Chin overlies the apices. Resolved interstitial edema with mild pulmonary venous congestion remaining. Suboptimal evaluation of the lung bases secondary to patient's size, technique, and the extent of cardiomegaly. IMPRESSION: Cardiomegaly with resolution of congestive heart failure. Possible bibasilar airspace disease, suboptimally evaluated. Electronically Signed   By: Abigail Miyamoto M.D.   On: 02/01/2021 09:00   DG CHEST PORT 1 VIEW  Result Date: 01/25/2021 CLINICAL DATA:  Central line placement. EXAM: PORTABLE CHEST 1 VIEW COMPARISON:  January 24, 2021 FINDINGS: Right central venous line is identified with distal tip in superior vena cava. There is no pneumothorax. The heart size is enlarged. Increased pulmonary interstitium is identified bilaterally. Osseous structures are stable. IMPRESSION: Right central venous line distal tip in the superior vena cava. No pneumothorax. Congestive heart failure Electronically Signed   By: Abelardo Diesel M.D.   On: 01/25/2021 12:05   DG Chest Port 1 View  Result Date: 01/24/2021 CLINICAL DATA:  Respiratory distress EXAM: PORTABLE CHEST 1 VIEW COMPARISON:  01/22/2021 FINDINGS: Markedly enlarged cardiac pericardial silhouette, potentially worsened from 01/22/2021 although some of the difference is probably projectional given that today's exam is obtained AP and the prior exam was PA. Stable scarring or atelectasis in the lingula. Mildly increased atelectasis along the left hemidiaphragm. Stable scarring or atelectasis along the right hemidiaphragm. IMPRESSION: 1. Markedly enlarged cardiac pericardial silhouette, probably overall similar to 10/09/2020 and 01/22/2021 when AP projection is taken into account. 2. Bibasilar and left midlung scarring versus atelectasis. Electronically  Signed   By: Van Clines M.D.   On: 01/24/2021 19:12    Cardiac Studies   Echocardiogram 10/09/2020: 1. No subcostal views.  2. Left ventricular ejection fraction, by estimation, is 60 to 65%. The  left ventricle has normal function. The left ventricle  has no regional  wall motion abnormalities. There is mild left ventricular hypertrophy.  Left ventricular diastolic parameters  are indeterminate.  3. Right ventricular systolic function is normal. The right ventricular  size is normal.  4. Left atrial size was severely dilated.  5. The mitral valve is normal in structure. Mild mitral valve  regurgitation. No evidence of mitral stenosis. Severe mitral annular  calcification.  6. The aortic valve is normal in structure. Aortic valve regurgitation is  trivial. Moderate aortic valve stenosis.   DCCV 01/20/2021 Evaluation: Findings: Post procedure EKG shows:Sinus with mobitz 1 AVB and PACs Complications:None  After the procedure, the patient was hypotensive requiring pressor support. Was also notably overloaded on physical exam. Given tenuous status, decision was made to admit to in-patient for pressor support and IV diuresis. Plan discussed with admitting physician, Dr. Ellyn Hack and primary cardiologist Dr. Margaretann Loveless.  Patient Profile     60 y.o. male with a history of moderate AS, CKD V, HTN, gout, persistent atrial flutter, DM2, and hx of hip dislocations and recent acute on chronic diastolic CHF with acute respiratory failure>> found to be in atrial flutter. Plan was for OP DCCV>>scheduled for 02/03/2021. On arrival pt found to be severely volume overloaded, was hypotensive post procedure requiring admission.  Assessment & Plan    ECG change AM 3/21: -Urgently reviewed by cardiology by Reino Bellis and Dr. Ellyn Hack this AM -on my review, appears more consistent with PR depression rather than ST elevation, though initial ECG is borderline in lead II -he is chest pain free  at this time -will transition to IV heparin from apixaban in case cath needed this admission, but he is currently symptom free and still intermittently requiring bipap  Typical atrial flutter with variable block: with coarse flutter/fib on telemetry as well -outpatient DCCV 02/04/2021, initially converted to sinus with Mobitz 1 AV block, PACs/PVCs. Returned to atrial flutter -was on apixaban, but with possible ST changes will convert to heparin today CHA2DS2/VAS Stroke Risk Points=3 -continue IV amiodarone -not stable enough for cardioversion today, would allow amiodarone to load prior to attempt. Volume removal will also be helpful. 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 -now on CRRT, appreciate management per nephrology  Hypotension -on midodrine -on CRRT  Anemia of chronic disease -Hgb 7.8 -needs anticoagulation given recent cardioversion  Moderate aortic stenosis -murmur present  Urinary tract infection, chronic right thigh wound, coag negative staph bacteremia -on ceftriaxone and vancomycin -question if coag neg staph is contaminant, present only in aerobic R hand sample  Acute hypercarbic respiratory failure -on BiPAP intermittently, [place] this AM -appreciate PCCM management   CRITICAL CARE Patient is critically ill with multiple organ systems affected and requires high complexity decision making. Total critical care time: 40 minutes. This time includes gathering of history, evaluation of patient's response to treatment, examination of patient, review of laboratory and imaging studies, and coordination with consultants. Greater than 50% of time spent in direct patient care.  For questions or updates, please contact [place] Please consult [email or web address] for contact info under     Signed, Buford Dresser, MD  01/27/2021, 9:25 AM

## 2021-01-26 NOTE — Consult Note (Signed)
[place] 1960/11/20  [id number].    Requesting MD: Dr. Valeta Harms Chief Complaint/Reason for Consult: Right groin abscess  HPI: Mark [name] is a 60 y.o. male who we were asked to see for right groin abscess.   Patient initially presented on 3/16 for an elective cardioversion for A. Flutter after being on Eliqius for an appropriate period of time. On presentation, he was found to be significant volume overloaded. After cardioversion, he was admitted to [facility] for diuresis. During admission the patient has had worsening kidney function for which nephrology has been following. Started on HD but developed CP and SOB w/ tachycardia. Required BiPAP and was transferred to the ICU. He is currently on CRRT. Also during admission he has had + proteus UCx and +blood cultures for which he is on abx. Cardiology transitioned from apixaban this am to heparin gtt this am in case patient needed a cath during admission, given some EKG changes (initally felt to have ST changes but later felt to be more PR depressions). 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   The patient reports that the area of drainage in his right groin has been present for > 1 year. He notes flares where it will swelling and then spontaneously drain purulent fluid before "drying up". He denies ever having pain to the area. He reports the most recent episode has been draining in several areas for a few months. He has never sought care for it. He denies associated fever, chills, n/v or skin redness. He denies abscesses under axilla etc that have had to be drained in the past.   ROS: Review of Systems  Constitutional: Negative for chills and fever.  Respiratory: Positive for shortness of breath. Negative for cough.   Cardiovascular: Positive for leg swelling. Negative for chest pain.  Gastrointestinal: Negative for abdominal pain, constipation, diarrhea, nausea and vomiting.   Genitourinary: Negative for dysuria.  Skin:       Chronically draining wound of the right groin  Psychiatric/Behavioral: Negative for substance abuse.  All other systems reviewed and are negative.   Family History  Problem Relation Age of Onset  . Cancer Mother   . Atrial fibrillation Mother   . Hypertension Mother   . Cancer Father     Past Medical History:  Diagnosis Date  . Anemia    labs today indicate (09/27/11)  . Arthritis    right knee  . Bleeding ulcer    hx of, required 6 units blood  . Blood transfusion    6 units with ulcer repair surgery  . Chronic kidney disease    stage IV, has fistula, never started diaylsis  . Complication of anesthesia    little slow to wake up with last surgery  . Dyspnea    with exertion  . Gout   . Hip dislocation, left ([place]) 10/09/2020  . Hypertension   . Osteoporosis   . Pneumonia     Past Surgical History:  Procedure Laterality Date  . AV FISTULA PLACEMENT Left 09/07/2016   Procedure: ARTERIOVENOUS (AV) FISTULA CREATION LEFT ARM;  Surgeon: Angelia Mould, MD;  Location: [place];  Service: Vascular;  Laterality: Left;  . CARDIOVERSION N/A 01/10/2021   Procedure: CARDIOVERSION;  Surgeon: Freada Bergeron, MD;  Location[facility];  Service: Cardiovascular;  Laterality: N/A;  . COLONOSCOPY    . EXTERNAL EAR SURGERY    . HIP CLOSED REDUCTION Left 07/27/2020  Procedure: CLOSED REDUCTION HIP;  Surgeon: Marchia Bond, MD;  Location: [place];  Service: Orthopedics;  Laterality: Left;  . HIP PINNING  11/2010  . JOINT REPLACEMENT    . OTHER SURGICAL HISTORY  ~ 08/1998   repair bleeding ulcer  . SHOULDER ARTHROSCOPY WITH ROTATOR CUFF REPAIR AND SUBACROMIAL DECOMPRESSION Right 05/26/2017   Procedure: SHOULDER ARTHROSCOPY WITH ROTATOR CUFF REPAIR AND SUBACROMIAL DECOMPRESSION;  Surgeon: Tania Ade, MD;  Location: [place];  Service: Orthopedics;  Laterality: Right;  SHOULDER ARTHROSCOPY WITH ROTATOR CUFF REPAIR AND  SUBACROMIAL DECOMPRESSION  . TOTAL HIP ARTHROPLASTY  10/06/11   left  . TOTAL HIP ARTHROPLASTY  10/06/2011   Procedure: TOTAL HIP ARTHROPLASTY;  Surgeon: Kerin Salen;  Location: [place];  Service: Orthopedics;  Laterality: Left;    Social History:  reports that he has been smoking cigarettes. He has a 8.00 pack-year smoking history. He quit smokeless tobacco use about 47 years ago.  His smokeless tobacco use included chew. He reports current alcohol use of about 5.0 standard drinks of alcohol per week. He reports that he does not use drugs.  Allergies:  Allergies  Allergen Reactions  . Penicillins Swelling, Rash and Other (See Comments)    Arms and eyes swell & skin breaks out  Has patient had a PCN reaction causing immediate rash, facial/tongue/throat swelling, SOB or lightheadedness with hypotension: Yes Has patient had a PCN reaction causing severe rash involving mucus membranes or skin necrosis: Yes Has patient had a PCN reaction that required hospitalization: No Has patient had a PCN reaction occurring within the last 10 years: No If all of the above answers are "NO", then may proceed with Cephalosporin use.   . Atorvastatin     Muscle pain in legs  . Rosuvastatin     Muscle pain in legs    Medications Prior to Admission  Medication Sig Dispense Refill  . apixaban (ELIQUIS) 5 MG TABS tablet Take 1 tablet (5 mg total) by mouth 2 (two) times daily. 60 tablet 0  . ferrous sulfate 325 (65 FE) MG tablet Take 1 tablet (325 mg total) by mouth daily with breakfast. 30 tablet 0  . furosemide (LASIX) 80 MG tablet Take 1 tablet (80 mg total) by mouth 2 (two) times daily. 60 tablet 1  . Ketotifen Fumarate (ALLERGY EYE DROPS OP) Place 1 drop into both eyes daily as needed (allergies).    . loratadine (CLARITIN) 10 MG tablet Take 10 mg by mouth daily.    . Menthol-Zinc Oxide (GOLD BOND EX) Apply 1 application topically daily as needed (itching).    . metoprolol succinate (TOPROL-XL) 25 MG 24  hr tablet Take 12.5 mg by mouth daily.    . midodrine (PROAMATINE) 5 MG tablet Take 3 tablets (15 mg total) by mouth 3 (three) times daily with meals. 180 tablet 1  . Multiple Vitamin (MULTIVITAMIN WITH MINERALS) TABS tablet Take 1 tablet by mouth daily after breakfast.    . polyethylene glycol (MIRALAX / GLYCOLAX) 17 g packet Take 17 g by mouth 2 (two) times daily as needed for mild constipation. 14 each 0  . sevelamer carbonate (RENVELA) 800 MG tablet Take 1 tablet (800 mg total) by mouth 3 (three) times daily with meals. 90 tablet 0  . tamsulosin (FLOMAX) 0.4 MG CAPS capsule Take 1 capsule (0.4 mg total) by mouth daily. 30 capsule 1  . vitamin B-12 (CYANOCOBALAMIN) 1000 MCG tablet Take 1,000 mcg by mouth daily.       Physical Exam:  Blood pressure 119/80, pulse 83, temperature (!) 97.2 F (36.2 C), temperature source Oral, resp. rate (!) 36, height '5\' 9"'$  (1.753 m), weight 108.3 kg, SpO2 100 %. General: pleasant, ill appearing male who is laying in bed in NAD HEENT: head is normocephalic, atraumatic.  Sclera are noninjected.  PERRL.  Ears and nose without any masses or lesions.  Mouth is pink and moist. Dentition fair Heart: Appears to be a regular rhythm with regular rate.  No obvious murmurs, gallops, or rubs noted.  Palpable pedal pulses bilaterally  Lungs: Decreased breaths sounds at bilateral bases with clear upper lung sounds. No wheezes or rhonchi.  Respiratory effort nonlabored Abd: Soft, NT/ND, +BS, no masses, hernias, or organomegaly. Prior midline laparotomy scar that is well healed.  MS: 1+ LE edema with stockings in place. L AV fistula with good thrill. Moves all extremities.  Psych: A&Ox4 with an appropriate affect Neuro: cranial nerves grossly intact, moves all extremities, normal speech, thought process intact, gait not assessed Skin: right groin with deep-seated, inflamed nodules, fibrotic raised scaring and multiple pinpoint areas of bloody purulent drainage that were probed  without noted underlying fluid collection.There is a 3x4cm area of fluctuance along right groin crease. No overlying erythema, heat, induration. Skin is otherwise warm and dry with no masses, lesions, or rashes       Results for orders placed or performed during the hospital encounter of 01/23/2021 (from the past 48 hour(s))  Type and screen [place]     Status: None (Preliminary result)   Collection Time: 01/24/21  2:00 PM  Result Value Ref Range   ABO/RH(D) O POS    Antibody Screen NEG    Sample Expiration 01/27/2021,2359    Unit Number [id number]    Blood Component Type RED CELLS,LR    Unit division 00    Status of Unit ALLOCATED    Transfusion Status OK TO TRANSFUSE    Crossmatch Result      Compatible Performed at [facility], [place] [address] [place], [place] [address]   Prepare RBC (crossmatch)     Status: None   Collection Time: 01/24/21  2:00 PM  Result Value Ref Range   Order Confirmation      ORDER PROCESSED BY BLOOD BANK Performed at [facility], [place] [address], [place], [place] [address]   Hepatitis B surface antigen     Status: None   Collection Time: 01/24/21  5:02 PM  Result Value Ref Range   Hepatitis B Surface Ag NON REACTIVE NON REACTIVE    Comment: Performed at [facility] [address] [place], [place] [address]  Hepatitis B surface antibody,qualitative     Status: None   Collection Time: 01/24/21  5:02 PM  Result Value Ref Range   Hep B S Ab NON REACTIVE NON REACTIVE    Comment: (NOTE) Inconsistent with immunity, less than 10 mIU/mL.  Performed at [facility], [place] [address] [place], [place] [address]   Hepatitis B core antibody, total     Status: None   Collection Time: 01/24/21  5:02 PM  Result Value Ref Range   Hep B Core Total Ab NON REACTIVE NON REACTIVE    Comment: Performed at [facility] [address], [place], [place] [address]  Blood gas, arterial     Status: Abnormal    Collection Time: 01/24/21  6:18 PM  Result Value Ref Range   FIO2 100.00  pH, Arterial 7.154 (LL) 7.350 - 7.450    Comment: CRITICAL RESULT CALLED TO, READ BACK BY AND VERIFIED WITH: STEVEN HARTLEY RT 1830 01/24/21 LIN AUNG    pCO2 arterial 71.1 (HH) 32.0 - 48.0 mmHg    Comment: STEVEN HARTLEY RT 1830 01/24/21 LIN AUNG   pO2, Arterial 293 (H) 83.0 - 108.0 mmHg   Bicarbonate 24.0 20.0 - 28.0 mmol/L   Acid-base deficit 3.7 (H) 0.0 - 2.0 mmol/L   O2 Saturation 99.2 %   Patient temperature 37.0    Collection site RIGHT RADIAL    Drawn by 164    Sample type ARTERIAL DRAW    Allens test (pass/fail) PASS PASS    Comment: Performed at [facility], [place] [address] [place], [place] [address]  Troponin I (High Sensitivity)     Status: Abnormal   Collection Time: 01/24/21  7:07 PM  Result Value Ref Range   Troponin I (High Sensitivity) 70 (H) <18 ng/L    Comment: (NOTE) Elevated high sensitivity troponin I (hsTnI) values and significant  changes across serial measurements may suggest ACS but many other  chronic and acute conditions are known to elevate hsTnI results.  Refer to the "Links" section for chest pain algorithms and additional  guidance. Performed at [facility], [place] [address] [place], [place] [address]   I-STAT 7, (LYTES, BLD GAS, ICA, H+H)     Status: Abnormal   Collection Time: 01/24/21 10:54 PM  Result Value Ref Range   pH, Arterial 7.298 (L) 7.350 - 7.450   pCO2 arterial 53.6 (H) 32.0 - 48.0 mmHg   pO2, Arterial 164 (H) 83.0 - 108.0 mmHg   Bicarbonate 26.5 20.0 - 28.0 mmol/L   TCO2 28 22 - 32 mmol/L   O2 Saturation 99.0 %   Acid-Base Excess 0.0 0.0 - 2.0 mmol/L   Sodium 135 135 - 145 mmol/L   Potassium 4.5 3.5 - 5.1 mmol/L   Calcium, Ion 1.54 (HH) 1.15 - 1.40 mmol/L   HCT 23.0 (L) 39.0 - 52.0 %   Hemoglobin 7.8 (L) 13.0 - 17.0 g/dL   Patient temperature 97.3 F    Collection site Radial    Drawn by HIDE    Sample type ARTERIAL    Comment NOTIFIED  PHYSICIAN   Troponin I (High Sensitivity)     Status: Abnormal   Collection Time: 01/24/21 11:04 PM  Result Value Ref Range   Troponin I (High Sensitivity) 64 (H) <18 ng/L    Comment: (NOTE) Elevated high sensitivity troponin I (hsTnI) values and significant  changes across serial measurements may suggest ACS but many other  chronic and acute conditions are known to elevate hsTnI results.  Refer to the "Links" section for chest pain algorithms and additional  guidance. Performed at [facility], [place] [address] [place], [place] [address]   CBC     Status: Abnormal   Collection Time: 01/24/21 11:04 PM  Result Value Ref Range   WBC 19.8 (H) 4.0 - 10.5 K/uL   RBC 2.17 (L) 4.22 - 5.81 MIL/uL   Hemoglobin 6.9 (LL) 13.0 - 17.0 g/dL    Comment: REPEATED TO VERIFY THIS CRITICAL RESULT HAS VERIFIED AND BEEN CALLED TO J. MCBRIDE RN BY K'LA [name] ON 03 19 2022 AT 2327, AND HAS BEEN READ BACK.     HCT 21.9 (L) 39.0 - 52.0 %   MCV 100.9 (H) 80.0 - 100.0 fL   MCH 31.8 26.0 - 34.0 pg  MCHC 31.5 30.0 - 36.0 g/dL   RDW 15.2 11.5 - 15.5 %   Platelets 278 150 - 400 K/uL   nRBC 0.0 0.0 - 0.2 %    Comment: Performed at [facility], [place] [address] [place], [place] [address]  CBC with Differential/Platelet     Status: Abnormal   Collection Time: 01/25/21  1:32 AM  Result Value Ref Range   WBC 20.3 (H) 4.0 - 10.5 K/uL   RBC 2.30 (L) 4.22 - 5.81 MIL/uL   Hemoglobin 7.1 (L) 13.0 - 17.0 g/dL   HCT 23.2 (L) 39.0 - 52.0 %   MCV 100.9 (H) 80.0 - 100.0 fL   MCH 30.9 26.0 - 34.0 pg   MCHC 30.6 30.0 - 36.0 g/dL   RDW 15.2 11.5 - 15.5 %   Platelets 254 150 - 400 K/uL   nRBC 0.0 0.0 - 0.2 %   Neutrophils Relative % 84 %   Neutro Abs 17.0 (H) 1.7 - 7.7 K/uL   Lymphocytes Relative 8 %   Lymphs Abs 1.5 0.7 - 4.0 K/uL   Monocytes Relative 7 %   Monocytes Absolute 1.5 (H) 0.1 - 1.0 K/uL   Eosinophils Relative 0 %   Eosinophils Absolute 0.0 0.0 - 0.5 K/uL   Basophils Relative 0 %    Basophils Absolute 0.0 0.0 - 0.1 K/uL   Immature Granulocytes 1 %   Abs Immature Granulocytes 0.19 (H) 0.00 - 0.07 K/uL    Comment: Performed at [facility] [address] [place], [place] [address]  Vitamin B12     Status: Abnormal   Collection Time: 01/25/21  1:32 AM  Result Value Ref Range   Vitamin B-12 2,884 (H) 180 - 914 pg/mL    Comment: (NOTE) This assay is not validated for testing neonatal or myeloproliferative syndrome specimens for Vitamin B12 levels. Performed at [facility], [place] [address] [place], [place] [address]   Folate, serum, performed at [facility] lab     Status: None   Collection Time: 01/25/21  1:32 AM  Result Value Ref Range   Folate 8.6 >5.9 ng/mL    Comment: Performed at [facility], [place] [address] [place], [place] [address]  Lactic acid, plasma     Status: None   Collection Time: 01/25/21  1:32 AM  Result Value Ref Range   Lactic Acid, Venous 1.5 0.5 - 1.9 mmol/L    Comment: Performed at [facility] [address], [place],  [address]  Renal function panel (daily at 1600)     Status: Abnormal   Collection Time: 01/25/21  3:41 PM  Result Value Ref Range   Sodium 134 (L) 135 - 145 mmol/L   Potassium 4.2 3.5 - 5.1 mmol/L   Chloride 98 98 - 111 mmol/L   CO2 24 22 - 32 mmol/L   Glucose, Bld 163 (H) 70 - 99 mg/dL    Comment: Glucose reference range applies only to samples taken after fasting for at least 8 hours.   BUN 114 (H) 8 - 23 mg/dL   Creatinine, Ser 4.20 (H) 0.61 - 1.24 mg/dL   Calcium 11.2 (H) 8.9 - 10.3 mg/dL   Phosphorus 7.1 (H) 2.5 - 4.6 mg/dL   Albumin 2.3 (L) 3.5 - 5.0 g/dL   GFR, Estimated 15 (L) >60 mL/min    Comment: (NOTE) Calculated using the CKD-EPI Creatinine Equation (2021)    Anion gap 12 5 - 15  Comment: Performed at [facility], [place] [address] [place], [place] [address]  CBC with Differential/Platelet     Status: Abnormal   Collection Time: 01/06/2021  4:30 AM  Result Value  Ref Range   WBC 21.3 (H) 4.0 - 10.5 K/uL   RBC 2.23 (L) 4.22 - 5.81 MIL/uL   Hemoglobin 7.1 (L) 13.0 - 17.0 g/dL   HCT 22.3 (L) 39.0 - 52.0 %   MCV 100.0 80.0 - 100.0 fL   MCH 31.8 26.0 - 34.0 pg   MCHC 31.8 30.0 - 36.0 g/dL   RDW 15.2 11.5 - 15.5 %   Platelets 278 150 - 400 K/uL   nRBC 0.2 0.0 - 0.2 %   Neutrophils Relative % 86 %   Neutro Abs 18.4 (H) 1.7 - 7.7 K/uL   Lymphocytes Relative 5 %   Lymphs Abs 1.1 0.7 - 4.0 K/uL   Monocytes Relative 6 %   Monocytes Absolute 1.3 (H) 0.1 - 1.0 K/uL   Eosinophils Relative 1 %   Eosinophils Absolute 0.1 0.0 - 0.5 K/uL   Basophils Relative 0 %   Basophils Absolute 0.0 0.0 - 0.1 K/uL   Immature Granulocytes 2 %   Abs Immature Granulocytes 0.32 (H) 0.00 - 0.07 K/uL    Comment: Performed at [facility] [address] [place], [place] [address]  Renal function panel (daily at 0500)     Status: Abnormal   Collection Time: 01/22/2021  4:30 AM  Result Value Ref Range   Sodium 133 (L) 135 - 145 mmol/L   Potassium 4.4 3.5 - 5.1 mmol/L   Chloride 98 98 - 111 mmol/L   CO2 23 22 - 32 mmol/L   Glucose, Bld 131 (H) 70 - 99 mg/dL    Comment: Glucose reference range applies only to samples taken after fasting for at least 8 hours.   BUN 74 (H) 8 - 23 mg/dL   Creatinine, Ser 3.20 (H) 0.61 - 1.24 mg/dL   Calcium 10.7 (H) 8.9 - 10.3 mg/dL   Phosphorus 4.6 2.5 - 4.6 mg/dL   Albumin 2.3 (L) 3.5 - 5.0 g/dL   GFR, Estimated 21 (L) >60 mL/min    Comment: (NOTE) Calculated using the CKD-EPI Creatinine Equation (2021)    Anion gap 12 5 - 15    Comment: Performed at [facility] [address] [place], [place] [address]  Magnesium     Status: None   Collection Time: 01/07/2021  4:30 AM  Result Value Ref Range   Magnesium 2.2 1.7 - 2.4 mg/dL    Comment: Performed at [facility] [address], [place], [place] [address]  Troponin I (High Sensitivity)     Status: Abnormal   Collection Time: 01/16/2021  4:50 AM  Result Value Ref Range    Troponin I (High Sensitivity) 41 (H) <18 ng/L    Comment: DELTA CHECK NOTED (NOTE) Elevated high sensitivity troponin I (hsTnI) values and significant  changes across serial measurements may suggest ACS but many other  chronic and acute conditions are known to elevate hsTnI results.  Refer to the Links section for chest pain algorithms and additional  guidance. Performed at [facility], [place] [address], [place], [place] [address]   I-STAT 7, (LYTES, BLD GAS, ICA, H+H)     Status: Abnormal   Collection Time: 01/25/2021  8:08 AM  Result Value Ref Range   pH, Arterial 7.430 7.350 - 7.450   pCO2 arterial  37.6 32.0 - 48.0 mmHg   pO2, Arterial 122 (H) 83.0 - 108.0 mmHg   Bicarbonate 24.9 20.0 - 28.0 mmol/L   TCO2 26 22 - 32 mmol/L   O2 Saturation 99.0 %   Acid-Base Excess 1.0 0.0 - 2.0 mmol/L   Sodium 136 135 - 145 mmol/L   Potassium 4.4 3.5 - 5.1 mmol/L   Calcium, Ion 1.44 (H) 1.15 - 1.40 mmol/L   HCT 23.0 (L) 39.0 - 52.0 %   Hemoglobin 7.8 (L) 13.0 - 17.0 g/dL   Sample type ARTERIAL    DG CHEST PORT 1 VIEW  Result Date: 01/15/2021 CLINICAL DATA:  Respiratory failure EXAM: PORTABLE CHEST 1 VIEW COMPARISON:  01/25/2021 FINDINGS: Central line unchanged with tip at high SVC. Patient rotated right. Marked cardiomegaly. No pleural effusion or pneumothorax. The Chin overlies the apices. Resolved interstitial edema with mild pulmonary venous congestion remaining. Suboptimal evaluation of the lung bases secondary to patient's size, technique, and the extent of cardiomegaly. IMPRESSION: Cardiomegaly with resolution of congestive heart failure. Possible bibasilar airspace disease, suboptimally evaluated. Electronically Signed   By: Abigail Miyamoto M.D.   On: 01/22/2021 09:00   DG CHEST PORT 1 VIEW  Result Date: 01/25/2021 CLINICAL DATA:  Central line placement. EXAM: PORTABLE CHEST 1 VIEW COMPARISON:  January 24, 2021 FINDINGS: Right central venous line is identified with distal tip in superior  vena cava. There is no pneumothorax. The heart size is enlarged. Increased pulmonary interstitium is identified bilaterally. Osseous structures are stable. IMPRESSION: Right central venous line distal tip in the superior vena cava. No pneumothorax. Congestive heart failure Electronically Signed   By: Abelardo Diesel M.D.   On: 01/25/2021 12:05   DG Chest Port 1 View  Result Date: 01/24/2021 CLINICAL DATA:  Respiratory distress EXAM: PORTABLE CHEST 1 VIEW COMPARISON:  01/22/2021 FINDINGS: Markedly enlarged cardiac pericardial silhouette, potentially worsened from 01/22/2021 although some of the difference is probably projectional given that today's exam is obtained AP and the prior exam was PA. Stable scarring or atelectasis in the lingula. Mildly increased atelectasis along the left hemidiaphragm. Stable scarring or atelectasis along the right hemidiaphragm. IMPRESSION: 1. Markedly enlarged cardiac pericardial silhouette, probably overall similar to 10/09/2020 and 01/22/2021 when AP projection is taken into account. 2. Bibasilar and left midlung scarring versus atelectasis. Electronically Signed   By: Van Clines M.D.   On: 01/24/2021 19:12   Anti-infectives (From admission, onward)   Start     Dose/Rate Route Frequency Ordered Stop   01/24/2021 1015  ceFEPIme (MAXIPIME) 2 g in sodium chloride 0.9 % 100 mL IVPB        2 g 200 mL/hr over 30 Minutes Intravenous Every 12 hours 01/25/2021 0917     02/02/2021 1000  vancomycin (VANCOREADY) IVPB 1000 mg/200 mL        1,000 mg 200 mL/hr over 60 Minutes Intravenous Every 24 hours 01/25/21 1518     01/25/21 0100  vancomycin (VANCOREADY) IVPB 2000 mg/400 mL        2,000 mg 200 mL/hr over 120 Minutes Intravenous NOW 01/25/21 0044 01/25/21 0258   01/25/21 0044  vancomycin variable dose per unstable renal function (pharmacist dosing)  Status:  Discontinued         Does not apply See admin instructions 01/25/21 0044 01/25/21 1518   01/24/21 1445  cefTRIAXone  (ROCEPHIN) 1 g in sodium chloride 0.9 % 100 mL IVPB  Status:  Discontinued        1 g 200 mL/hr over  30 Minutes Intravenous Every 24 hours 01/24/21 1356 01/16/2021 0917       Assessment/Plan Acute respiratory failure - now off biPAP 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 Flutter - on heparin drip Anemia of chronic disease Proteus UTI + Blood cultures  Right groin wounds This is a 60 y.o. male who is currently in the ICU with acute respiratory failure in the setting of acute diastolic CHF excerebration, on heparin for A. Flutter, and on abx(cefepime/Vanc) for UTI/+Blood Cultures. We were asked to see for right groin wound draining purulence. He notes that this has been intermittently draining in multiple areas over the last 1 year with the most recent episode starting several weeks before admission. The wound appears deep-seated with inflamed nodules, fibrotic raised scaring and multiple pinpoint areas of bloody purulent drainage that were probed without noted underlying fluid collection.There is a 3x4cm area of fluctuance along right groin crease without overlying cellulitis. Will discuss with MD if 3x4cm area of fluctuance will need bedside drainage, however I do not think this is the cause of the patients illness/decompensation. He is already on abx. I suspect this may be a Hidradenitis picture and will start on some topical clindamycin. I do not think patient needs CT to evaluate this and have reached out to CCM to let them know this. Further recs to follow.   Jillyn Ledger, [facility] 01/18/2021, 1:48 PM Please see Amion for pager number during day hours 7:00am-4:30pm

## 2021-01-26 NOTE — Progress Notes (Signed)
ANTICOAGULATION CONSULT NOTE - Initial Consult  Pharmacy Consult for heparin Indication: atrial fibrillation  Allergies  Allergen Reactions  . Penicillins Swelling, Rash and Other (See Comments)    Arms and eyes swell & skin breaks out  Has patient had a PCN reaction causing immediate rash, facial/tongue/throat swelling, SOB or lightheadedness with hypotension: Yes Has patient had a PCN reaction causing severe rash involving mucus membranes or skin necrosis: Yes Has patient had a PCN reaction that required hospitalization: No Has patient had a PCN reaction occurring within the last 10 years: No If all of the above answers are "NO", then may proceed with Cephalosporin use.   . Atorvastatin     Muscle pain in legs  . Rosuvastatin     Muscle pain in legs    Patient Measurements: Height: '5\' 9"'$  (175.3 cm) Weight: 108.3 kg (238 lb 12.1 oz) IBW/kg (Calculated) : 70.7 Heparin Dosing Weight: 95kg  Vital Signs: Temp: 98.2 F (36.8 C) (03/21 0400) Temp Source: Axillary (03/21 0400) BP: 100/72 (03/21 0700) Pulse Rate: 119 (03/21 0748)  Labs: Recent Labs    01/24/21 0303 01/24/21 1907 01/24/21 2254 01/24/21 2304 01/25/21 0132 01/25/21 1541 01/16/2021 0430 01/25/2021 0450 01/06/2021 0808  HGB 7.3*  --    < > 6.9* 7.1*  --  7.1*  --  7.8*  HCT 23.2*  --    < > 21.9* 23.2*  --  22.3*  --  23.0*  PLT 256  --   --  278 254  --  278  --   --   CREATININE 5.75*  --   --   --   --  4.20* 3.20*  --   --   CKTOTAL 22*  --   --   --   --   --   --   --   --   TROPONINIHS  --  70*  --  64*  --   --   --  41*  --    < > = values in this interval not displayed.    Estimated Creatinine Clearance: 29.4 mL/min (A) (by C-G formula based on SCr of 3.2 mg/dL (H)).   Medical History: Past Medical History:  Diagnosis Date  . Anemia    labs today indicate (09/27/11)  . Arthritis    right knee  . Bleeding ulcer    hx of, required 6 units blood  . Blood transfusion    6 units with ulcer  repair surgery  . Chronic kidney disease    stage IV, has fistula, never started diaylsis  . Complication of anesthesia    little slow to wake up with last surgery  . Dyspnea    with exertion  . Gout   . Hip dislocation, left ([place]) 10/09/2020  . Hypertension   . Osteoporosis   . Pneumonia     Assessment: 48 yoM admitted with AFL and CHF. Pt s/p failed DCCV 3/16. Pt on apixaban PTA which has been continued, now to transition to heparin with concerns for EKG changes and likely need for an ischemic evaluation. Last apixaban dose was ~2200 but will go ahead and start heparin to try to prevent subtherapeutic anticoagulation with recent DCCV and possible need for another DCCV.  Goal of Therapy:  Heparin level 0.3-0.7 units/ml aPTT 66-102 seconds Monitor platelets by anticoagulation protocol: Yes   Plan:  Heparin 1400 units/h no bolus Check aPTT in 8h Daily heparin level and aPTT  Arrie Senate, PharmD, BCPS, [facility] Clinical Pharmacist  [phone] Please check AMION for all [place] numbers 01/10/2021

## 2021-01-26 NOTE — Progress Notes (Signed)
[facility] NEPHROLOGY PROGRESS NOTE  Assessment/ Plan: Pt is a 60 y.o. yo male  With PMH of HTN, DM, HLD, obesity, CKD stage V baseline creatinine level around 5 admitted with acute diastolic CHF.  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  Started HD 3/19 however around 45 minutes into the treatment patient developed chest pain, tachycardia and worsening hypoxia.  Rapid response called and later patient was transferred to ICU.  He is grossly volume overload, systolic blood pressure around 90s and req'ed BiPAP.  Unable to ultrafiltrate with IHD therefore started CRRT on 3/19 mainly for ultrafiltration and azotemia. -He has a left radiocephalic fistula that was created in October, 2017 by Dr. Scot Dock that is mature, able to use for IHD. -plan is to not restart CRRT if filter clots/expires (whichever comes first) especially as his BP is on the softer side this AM and his volume status improved (and CXR improved). Thereafter, will assess for further renal replacement therapy needs (IHD reattempt) vs CRRT again)  Acute exacerbation of diastolic heart failure:  Started CRRT mainly for ultrafiltration.  Volume status/CXR improved. Not restarting CRRT  Hypertension: On metoprolol for atrial flutter.  Given low blood pressure today he may need pressures to support ultrafiltration.  Anemia of chronic disease: Iron deficiency therefore ordered IV iron and ESA on 3/17.  Continue ESA.  Transfuse for hgb <7  Secondary hyperparathyroidism: Both calcium and phosphorus level elevated.  Discontinued MVA and hold any vitamin D.  Increased Auryxia yesterday but currently n.p.o.  Now expect to improve with RRT.  Aflutter -cardiology following. On eliquis  Subjective: Seen and examined in ICU. Tolerating CRRT. BP on the softer side this am (SBP 90's) but does have chronic hypotension. Patient reports that he feels as if his breathing is better (currently  on bipap). No acute events. Net neg ~3.4L. Objective Vital signs in last 24 hours: Vitals:   02/02/2021 0852 01/07/2021 0900 01/25/2021 1000 01/17/2021 1100  BP:  111/64 107/69 109/70  Pulse: (!) 102  92 80  Resp: (!) 24 (!) 26 (!) 33 (!) 37  Temp:      TempSrc:      SpO2: 94%  100% 100%  Weight:      Height:       Weight change: -0.2 kg  Intake/Output Summary (Last 24 hours) at 01/10/2021 1124 Last data filed at 01/15/2021 1100 Gross per 24 hour  Intake 1136.07 ml  Output 5150 ml  Net -4013.93 ml       Labs: Basic Metabolic Panel: Recent Labs  Lab 01/22/21 0336 01/23/21 0944 01/24/21 0303 01/24/21 2254 01/25/21 1541 01/15/2021 0430 02/02/2021 0808  NA 136   < > 132*   < > 134* 133* 136  K 3.7   < > 4.4   < > 4.2 4.4 4.4  CL 99   < > 95*  --  98 98  --   CO2 21*   < > 23  --  24 23  --   GLUCOSE 99   < > 145*  --  163* 131*  --   BUN 113*   < > 141*  --  114* 74*  --   CREATININE 5.59*   < > 5.75*  --  4.20* 3.20*  --   CALCIUM 11.5*   < > 11.6*  --  11.2* 10.7*  --   PHOS 8.0*  --   --   --  7.1* 4.6  --    < > =  values in this interval not displayed.   Liver Function Tests: Recent Labs  Lab 01/24/21 0303 01/25/21 1541 01/28/2021 0430  AST 22  --   --   ALT 15  --   --   ALKPHOS 68  --   --   BILITOT 1.2  --   --   PROT 7.6  --   --   ALBUMIN 2.4* 2.3* 2.3*   No results for input(s): LIPASE, AMYLASE in the last 168 hours. No results for input(s): AMMONIA in the last 168 hours. CBC: Recent Labs  Lab 01/23/21 0944 01/24/21 0303 01/24/21 2254 01/24/21 2304 01/25/21 0132 01/08/2021 0430 01/15/2021 0808  WBC 18.0* 18.6*  --  19.8* 20.3* 21.3*  --   NEUTROABS 15.3* 15.8*  --   --  17.0* 18.4*  --   HGB 7.4* 7.3*   < > 6.9* 7.1* 7.1* 7.8*  HCT 23.0* 23.2*   < > 21.9* 23.2* 22.3* 23.0*  MCV 99.1 100.4*  --  100.9* 100.9* 100.0  --   PLT 263 256  --  278 254 278  --    < > = values in this interval not displayed.   Cardiac Enzymes: Recent Labs  Lab  01/24/21 0303  CKTOTAL 22*   CBG: No results for input(s): GLUCAP in the last 168 hours.  Iron Studies:  No results for input(s): IRON, TIBC, TRANSFERRIN, FERRITIN in the last 72 hours. Studies/Results: DG CHEST PORT 1 VIEW  Result Date: 02/05/2021 CLINICAL DATA:  Respiratory failure EXAM: PORTABLE CHEST 1 VIEW COMPARISON:  01/25/2021 FINDINGS: Central line unchanged with tip at high SVC. Patient rotated right. Marked cardiomegaly. No pleural effusion or pneumothorax. The Chin overlies the apices. Resolved interstitial edema with mild pulmonary venous congestion remaining. Suboptimal evaluation of the lung bases secondary to patient's size, technique, and the extent of cardiomegaly. IMPRESSION: Cardiomegaly with resolution of congestive heart failure. Possible bibasilar airspace disease, suboptimally evaluated. Electronically Signed   By: Abigail Miyamoto M.D.   On: 01/16/2021 09:00   DG CHEST PORT 1 VIEW  Result Date: 01/25/2021 CLINICAL DATA:  Central line placement. EXAM: PORTABLE CHEST 1 VIEW COMPARISON:  January 24, 2021 FINDINGS: Right central venous line is identified with distal tip in superior vena cava. There is no pneumothorax. The heart size is enlarged. Increased pulmonary interstitium is identified bilaterally. Osseous structures are stable. IMPRESSION: Right central venous line distal tip in the superior vena cava. No pneumothorax. Congestive heart failure Electronically Signed   By: Abelardo Diesel M.D.   On: 01/25/2021 12:05   DG Chest Port 1 View  Result Date: 01/24/2021 CLINICAL DATA:  Respiratory distress EXAM: PORTABLE CHEST 1 VIEW COMPARISON:  01/22/2021 FINDINGS: Markedly enlarged cardiac pericardial silhouette, potentially worsened from 01/22/2021 although some of the difference is probably projectional given that today's exam is obtained AP and the prior exam was PA. Stable scarring or atelectasis in the lingula. Mildly increased atelectasis along the left hemidiaphragm. Stable  scarring or atelectasis along the right hemidiaphragm. IMPRESSION: 1. Markedly enlarged cardiac pericardial silhouette, probably overall similar to 10/09/2020 and 01/22/2021 when AP projection is taken into account. 2. Bibasilar and left midlung scarring versus atelectasis. Electronically Signed   By: Van Clines M.D.   On: 01/24/2021 19:12    Medications: Infusions: .  prismasol BGK 4/2.5 500 mL/hr at 01/20/2021 0808  .  prismasol BGK 4/2.5 300 mL/hr at 02/02/2021 0516  . amiodarone 30 mg/hr (01/11/2021 1100)  . ceFEPime (MAXIPIME) IV Stopped (01/28/2021 1058)  .  ferric gluconate (FERRLECIT/NULECIT) IV 250 mg (02/01/2021 1806)  . heparin 1,400 Units/hr (02/04/2021 1100)  . prismasol BGK 4/2.5 1,500 mL/hr at 01/28/2021 0808  . vancomycin Stopped (01/09/2021 1022)    Scheduled Medications: . Chlorhexidine Gluconate Cloth  6 each Topical Q0600  . darbepoetin (ARANESP) injection - NON-DIALYSIS  150 mcg Subcutaneous Q Thu-1800  . diclofenac Sodium  2 g Topical QID  . ferric citrate  630 mg Oral TID WC  . loratadine  10 mg Oral Daily  . midodrine  15 mg Oral TID WC  . pantoprazole  20 mg Oral Daily  . tamsulosin  0.4 mg Oral QPC supper  . vitamin B-12  1,000 mcg Oral Daily    have reviewed scheduled and prn medications.  Physical Exam: General: With BiPAP, alert awake Heart: s1s2, irreg irreg Lungs: diminished air entry bibasilar, no w/r/r/c, unlabored, on bipap Abdomen:soft, Non-tender, distended Extremities: 1+ Bilateral lower extremity edema Neuro: awake, alert, moves all ext spontaneously Dialysis Access: AV fistula has good thrill and bruit  Mark [name] 01/14/2021,11:24 AM  LOS: 5 days

## 2021-01-26 NOTE — Progress Notes (Signed)
[place] for heparin (Eliquis on hold) Indication: atrial fibrillation  Allergies  Allergen Reactions  . Penicillins Swelling, Rash and Other (See Comments)    Arms and eyes swell & skin breaks out  Has patient had a PCN reaction causing immediate rash, facial/tongue/throat swelling, SOB or lightheadedness with hypotension: Yes Has patient had a PCN reaction causing severe rash involving mucus membranes or skin necrosis: Yes Has patient had a PCN reaction that required hospitalization: No Has patient had a PCN reaction occurring within the last 10 years: No If all of the above answers are "NO", then may proceed with Cephalosporin use.   . Atorvastatin     Muscle pain in legs  . Rosuvastatin     Muscle pain in legs    Patient Measurements: Height: '5\' 9"'$  (175.3 cm) Weight: 108.3 kg (238 lb 12.1 oz) IBW/kg (Calculated) : 70.7 Heparin Dosing Weight: 95kg  Vital Signs: Temp: 96.6 F (35.9 C) (03/21 1500) Temp Source: Axillary (03/21 1500) BP: 103/91 (03/21 1700) Pulse Rate: 74 (03/21 1700)  Labs: Recent Labs    01/24/21 0303 01/24/21 1907 01/24/21 2254 01/24/21 2304 01/25/21 0132 01/25/21 1541 01/14/2021 0430 02/01/2021 0450 02/01/2021 0808 01/27/2021 1600 01/08/2021 1630  HGB 7.3*  --    < > 6.9* 7.1*  --  7.1*  --  7.8*  --   --   HCT 23.2*  --    < > 21.9* 23.2*  --  22.3*  --  23.0*  --   --   PLT 256  --   --  278 254  --  278  --   --   --   --   APTT  --   --   --   --   --   --   --   --   --   --  69*  CREATININE 5.75*  --   --   --   --  4.20* 3.20*  --   --  2.60*  --   CKTOTAL 22*  --   --   --   --   --   --   --   --   --   --   TROPONINIHS  --  70*  --  64*  --   --   --  41*  --   --   --    < > = values in this interval not displayed.    Estimated Creatinine Clearance: 36.2 mL/min (A) (by C-G formula based on SCr of 2.6 mg/dL (H)).   Medical History: Past Medical History:  Diagnosis Date  . Anemia    labs today  indicate (09/27/11)  . Arthritis    right knee  . Bleeding ulcer    hx of, required 6 units blood  . Blood transfusion    6 units with ulcer repair surgery  . Chronic kidney disease    stage IV, has fistula, never started diaylsis  . Complication of anesthesia    little slow to wake up with last surgery  . Dyspnea    with exertion  . Gout   . Hip dislocation, left ([place]) 10/09/2020  . Hypertension   . Osteoporosis   . Pneumonia     Assessment: 89 yoM admitted with AFL and CHF. Pt s/p failed DCCV 3/16. Pt on apixaban PTA which has been continued, now to transition to heparin with concerns for EKG changes and likely need for an ischemic  evaluation. Last apixaban dose was ~2200 but will go ahead and start heparin to try to prevent subtherapeutic anticoagulation with recent DCCV and possible need for another DCCV.  APTT this afternoon within goal range.  Heparin running peripherally, and aPTT drawn from central line.  No overt bleeding or complications noted.  Goal of Therapy:  Heparin level 0.3-0.7 units/ml aPTT 66-102 seconds Monitor platelets by anticoagulation protocol: Yes   Plan:  Continue heparin 1400 units/hr. Check aPTT in 8h Daily heparin level and aPTT  [place] Crane, Roylene Reason, [facility] Clinical Pharmacist  02/03/2021 5:53 PM   [facility] pharmacy phone numbers are listed on [email or web address]

## 2021-01-26 NOTE — Progress Notes (Addendum)
NAME:  Mark [name], MRN:  [id number], DOB:  1960-11-29, LOS: 5 ADMISSION DATE:  01/28/2021, CONSULTATION DATE: 3/19 REFERRING MD:  Dr. Erlinda Hong, CHIEF COMPLAINT:  TRH    History of Present Illness:  60 y/o M who presented to [facility] on 3/16 with shortness of breath, leg swelling and palpitations.   The patient has known CKD IV with baseline sr cr of 5, conservative management, L arm AVF in place in anticipation of HD.  He was brought in by Cardiology 3/16 for elective cardioversion.  On presentation, he was found to have significant volume overload needing admission per TRH.  After cardioversion he became transiently hypotensive and briefly required neosynephrine gtt.  He was admitted and Nephrology was consulted. He was diuresed with 1.5L UOP. Urinalysis grew 100k proteus, sensitivities pending. He was started on rocephin.  The patient remained on minimal O2. He had intermittent complaints of shoulder / left chest pain.  There were concerns for possible blood in stool, FOBT pending.   During his initial iHD on 3/19 evening, the patient began having chest pain and shortness of breath approximately 45 minutes into the therapy with HR into the 140's.  He was placed on a NRB and HD was stopped. He was transferred back to his room and placed on BiPAP. ABG was obtained which showed an acute hypercarbic respiratory failure (7.154 / 71).  CXR showed marked cardiomegaly with basilar atelectasis. RRT was called to assess the patient.  Cardiology was consulted, he was given 120 mg IV lasix and recommended to be started on an amiodarone infusion.  Troponin was assessed and 70, lactic acid 1.2.  The patient was transferred to ICU for further observation.     Pertinent  Medical History  CKD IV - HD started this admit (01/10/2021) HTN HLD Chronic Diastolic CHF  Moderate AS Atrial Flutter - on Eliquis  Obesity  Anemia of Chronic Disease   Significant Hospital Events: Including procedures, antibiotic start and stop  dates in addition to other pertinent events    3/16 Admit after cardioversion, SOB, leg swelling / palpitations prior to presentation   3/19 PCCM consulted   3/20 CRRT started  3/21 Improved mental status, wore BiPAP overnight. Staph epidermis growing from [facility]. UC growing proteus, abx changed from ceftriaxone to cefepime  Interim History / Subjective:  Tmax 100.2  VSS Off bipap this am  CVVHD ongoing, -4L in last 24 hours with HD / -3.6L overall for last 24 hours   Objective   Blood pressure 111/64, pulse (!) 102, temperature 100.2 F (37.9 C), temperature source Axillary, resp. rate (!) 26, height '5\' 9"'$  (1.753 m), weight 108.3 kg, SpO2 94 %.    FiO2 (%):  [30 %] 30 %   Intake/Output Summary (Last 24 hours) at 01/20/2021 [id number] Last data filed at 01/27/2021 [id number] Gross per 24 hour  Intake 774.67 ml  Output 4685 ml  Net -3910.33 ml   Filed Weights   01/24/21 2100 01/25/21 0500 01/11/2021 0500  Weight: 108.5 kg 108.5 kg 108.3 kg    Examination: General: chronically ill appearing adult male lying in bed in NAD on BiPAP  HEENT: MM pink/moist, bipap mask in place, anicteric, pupils equal / reactive  Neuro: Improved mental status from my prior exam, alert / oriented to self, place.  Unable to state time. MAE / normal strength CV: s1s2 irr irr, AF on monitor 110's, no m/r/g PULM: tachypnea but non-labored / no accessory muscle use, lungs bilaterally clear anterior, diminished bases GI:  soft, bsx4 active  Extremities: warm/dry, BLE 1+ pretibial pitting edema  Skin: L AVF on FA with + thrill / bruit. Right thigh with small circular lesion, left suprapubic area with raised area, draining bloody drainage  Lab/imaging reviewed    Resolved Hospital Problem list     Assessment & Plan:   Acute Hypercarbic Respiratory Failure in setting of Acute Diastolic CHF  -intermittent bipap -volume removal with CVVHD, hopeful to avoid intubation  -follow intermittent CXR -pulmonary hygiene -IS,  mobilize  -? If he has underlying OSA / OHS given hypercarbia, consider outpatient sleep study  CKD V / New ESRD  HD initiated this admit, did not tolerate initial HD due to chest pain -appreciate Nephrology input  -continue CVVHD for volume removal  -Trend BMP / urinary output -Replace electrolytes as indicated  Acute Exacerbation of dCHF Atrial Flutter  Chronic Hypotension S/P DCCV 3/16 with brief hypotension requiring neo, had some chest pain then.  On eliquis for AFlutter.  -appreciate Cardiology evaluation  -continue eliquis, midodrine -off lopressor  -defer timing of cardioversion attempt to Cardiology > not a candidate currently with volume status   Anemia of Chronic Disease, ? Acute Component  Reported red stool, hx of gastric ulcer. S/P PRVC 3/19 -follow CBC  -transfuse for Hgb <7%   BPH -continue flomax   Proteus UTI Perineal Abscess, Right Thigh Wound  CNS Bacteremia ? Contaminant with 2/4 bottles  -change ceftriaxone to cefepime with resistance pattern for proteus -continue vancomycin  -assess CT abd pelvis to review right suprapubic area, r/o abscess -CCS consult for I&D  Left Shoulder Pain  -follow up MRI shoulder / spine when more stable   Best practice (evaluated daily)  Diet:  NPO Pain/Anxiety/Delirium protocol (if indicated): No VAP protocol (if indicated): Not indicated DVT prophylaxis: Systemic AC GI prophylaxis: N/A Glucose control:  Basal insulin No Central venous access:  Yes, and it is still needed Arterial line:  N/A Foley:  N/A Mobility:  bed rest  PT consulted: N/A Last date of multidisciplinary goals of care discussion:  Code Status:  full code Disposition: ICU   Brother, [name], updated via phone 3/21.  Critical care time:     Noe Gens, MSN, APRN, NP-C, AGACNP-BC [facility] 01/22/2021, 9:27 AM  Please see [email or web address] for pager details.   From 7A-7P if no response, please call [phone] After  hours, please call Warren Lacy [phone]    PCCM:   60 year old gentleman presented to the hospital with shortness of breath, palpitations and leg swelling.  Recent diagnosis of CKD 4 with conservative management and already had a fistula placed but had not started HD on the outpatient setting.  Patient found to have staph bacteremia and a urinary tract infection secondary to Proteus.  Kidney function was worsening placed on CVVHD.  He had CO2 narcosis from acute hypercapnic respiratory failure and was placed on BiPAP.  BP 109/70   Pulse 80   Temp 100.2 F (37.9 C) (Axillary)   Resp (!) 37   Ht '5\' 9"'$  (1.753 m)   Wt 108.3 kg   SpO2 100%   BMI 35.26 kg/m   General: Obese male resting in bed on CVVHD HEENT: NCAT tracking appropriately Heart: Regular rhythm S1-S2 none lungs: Clear to auscultation bilaterally no crackles no wheeze Abdomen: Mildly distended GU: Drainage from wound in perineum, likely perineal abscess  Labs: Rising leukocytosis, white count 21,000 Sodium 133, serum creatinine 3.2  Assessment: Acute hypercapnic respiratory failure in the setting of acute  diastolic heart failure on and off BiPAP therapy. CKD 5, likely due to ESRD Sepsis secondary to Proteus UTI and possible perineal abscess, staph bacteremia ST segment changes, PR depression on ECG A flutter status post DCCV on 3/16 Chronic hypotension   Plan: Consult surgery to evaluate perineal infection Continue midodrine Continue amiodarone Stop Eliquis, start heparin Cardiology consultation appreciated. Continue ceftriaxone plus vancomycin  This patient is critically ill with multiple organ system failure; which, requires frequent high complexity decision making, assessment, support, evaluation, and titration of therapies. This was completed through the application of advanced monitoring technologies and extensive interpretation of multiple databases. During this encounter critical care time was devoted to  patient care services described in this note for 32 minutes.  Garner Nash, DO [facility] 01/10/2021 11:40 AM

## 2021-01-27 DIAGNOSIS — N186 End stage renal disease: Secondary | ICD-10-CM | POA: Diagnosis not present

## 2021-01-27 DIAGNOSIS — E8779 Other fluid overload: Secondary | ICD-10-CM | POA: Diagnosis not present

## 2021-01-27 DIAGNOSIS — I4892 Unspecified atrial flutter: Secondary | ICD-10-CM | POA: Diagnosis not present

## 2021-01-27 DIAGNOSIS — I5033 Acute on chronic diastolic (congestive) heart failure: Secondary | ICD-10-CM | POA: Diagnosis not present

## 2021-01-27 DIAGNOSIS — J9602 Acute respiratory failure with hypercapnia: Secondary | ICD-10-CM | POA: Diagnosis not present

## 2021-01-27 LAB — CULTURE, BLOOD (ROUTINE X 2)
Special Requests: ADEQUATE
Special Requests: ADEQUATE

## 2021-01-27 LAB — RENAL FUNCTION PANEL
Albumin: 2.2 g/dL — ABNORMAL LOW (ref 3.5–5.0)
Albumin: 2.2 g/dL — ABNORMAL LOW (ref 3.5–5.0)
Anion gap: 9 (ref 5–15)
Anion gap: 11 (ref 5–15)
BUN: 67 mg/dL — ABNORMAL HIGH (ref 8–23)
BUN: 78 mg/dL — ABNORMAL HIGH (ref 8–23)
CO2: 25 mmol/L (ref 22–32)
CO2: 23 mmol/L (ref 22–32)
Calcium: 12.4 mg/dL — ABNORMAL HIGH (ref 8.9–10.3)
Calcium: 12.1 mg/dL — ABNORMAL HIGH (ref 8.9–10.3)
Chloride: 99 mmol/L (ref 98–111)
Chloride: 98 mmol/L (ref 98–111)
Creatinine, Ser: 3.42 mg/dL — ABNORMAL HIGH (ref 0.61–1.24)
Creatinine, Ser: 3.77 mg/dL — ABNORMAL HIGH (ref 0.61–1.24)
GFR, Estimated: 20 mL/min — ABNORMAL LOW (ref >60)
GFR, Estimated: 17 mL/min — ABNORMAL LOW (ref >60)
Glucose, Bld: 160 mg/dL — ABNORMAL HIGH (ref 70–99)
Glucose, Bld: 129 mg/dL — ABNORMAL HIGH (ref 70–99)
Phosphorus: 6.6 mg/dL — ABNORMAL HIGH (ref 2.5–4.6)
Phosphorus: 6.5 mg/dL — ABNORMAL HIGH (ref 2.5–4.6)
Potassium: 4.8 mmol/L (ref 3.5–5.1)
Potassium: 4.7 mmol/L (ref 3.5–5.1)
Sodium: 133 mmol/L — ABNORMAL LOW (ref 135–145)
Sodium: 132 mmol/L — ABNORMAL LOW (ref 135–145)

## 2021-01-27 LAB — POCT I-STAT 7, (LYTES, BLD GAS, ICA,H+H)
Acid-base deficit: 1 mmol/L (ref 0.0–2.0)
Bicarbonate: 24.4 mmol/L (ref 20.0–28.0)
Calcium, Ion: 1.65 mmol/L (ref 1.15–1.40)
HCT: 24 % — ABNORMAL LOW (ref 39.0–52.0)
Hemoglobin: 8.2 g/dL — ABNORMAL LOW (ref 13.0–17.0)
O2 Saturation: 98 %
Potassium: 4.6 mmol/L (ref 3.5–5.1)
Sodium: 132 mmol/L — ABNORMAL LOW (ref 135–145)
TCO2: 26 mmol/L (ref 22–32)
pCO2 arterial: 43 mmHg (ref 32.0–48.0)
pH, Arterial: 7.362 (ref 7.350–7.450)
pO2, Arterial: 110 mmHg — ABNORMAL HIGH (ref 83.0–108.0)

## 2021-01-27 LAB — CBC
HCT: 20.8 % — ABNORMAL LOW (ref 39.0–52.0)
Hemoglobin: 6.5 g/dL — CL (ref 13.0–17.0)
MCH: 32 pg (ref 26.0–34.0)
MCHC: 31.3 g/dL (ref 30.0–36.0)
MCV: 102.5 fL — ABNORMAL HIGH (ref 80.0–100.0)
Platelets: 241 10*3/uL (ref 150–400)
RBC: 2.03 MIL/uL — ABNORMAL LOW (ref 4.22–5.81)
RDW: 15.8 % — ABNORMAL HIGH (ref 11.5–15.5)
WBC: 21 10*3/uL — ABNORMAL HIGH (ref 4.0–10.5)
nRBC: 0.6 % — ABNORMAL HIGH (ref 0.0–0.2)

## 2021-01-27 LAB — AEROBIC CULTURE W GRAM STAIN (SUPERFICIAL SPECIMEN)

## 2021-01-27 LAB — APTT
aPTT: 71 s — ABNORMAL HIGH (ref 24–36)
aPTT: 56 seconds — ABNORMAL HIGH (ref 24–36)

## 2021-01-27 LAB — VANCOMYCIN, RANDOM: Vancomycin Rm: 21

## 2021-01-27 LAB — MAGNESIUM: Magnesium: 2.4 mg/dL (ref 1.7–2.4)

## 2021-01-27 LAB — PREPARE RBC (CROSSMATCH)

## 2021-01-27 LAB — HEPARIN LEVEL (UNFRACTIONATED): Heparin Unfractionated: >2.2 IU/mL — ABNORMAL HIGH (ref 0.30–0.70)

## 2021-01-27 MED ORDER — DEXMEDETOMIDINE HCL IN NACL 400 MCG/100ML IV SOLN
0.4000 ug/kg/h | INTRAVENOUS | Status: DC
  Administered 2021-01-27: 0.4 ug/kg/h via INTRAVENOUS
  Filled 2021-01-27: qty 100

## 2021-01-27 MED ORDER — SODIUM CHLORIDE 0.9 % IV SOLN
1.0000 g | INTRAVENOUS | Status: DC
  Administered 2021-01-28 – 2021-01-29 (×2): 1 g via INTRAVENOUS
  Filled 2021-01-27 (×3): qty 1

## 2021-01-27 MED ORDER — SODIUM CHLORIDE 0.9% IV SOLUTION: Freq: Once | INTRAVENOUS | Status: DC

## 2021-01-27 MED ORDER — SODIUM CHLORIDE 0.9 % IV SOLN: 2.0000 g | INTRAVENOUS | Status: DC

## 2021-01-27 NOTE — Progress Notes (Addendum)
[place] for heparin (Eliquis on hold) Indication: atrial fibrillation  Allergies  Allergen Reactions  . Penicillins Swelling, Rash and Other (See Comments)    Arms and eyes swell & skin breaks out  Has patient had a PCN reaction causing immediate rash, facial/tongue/throat swelling, SOB or lightheadedness with hypotension: Yes Has patient had a PCN reaction causing severe rash involving mucus membranes or skin necrosis: Yes Has patient had a PCN reaction that required hospitalization: No Has patient had a PCN reaction occurring within the last 10 years: No If all of the above answers are "NO", then may proceed with Cephalosporin use.   . Atorvastatin     Muscle pain in legs  . Rosuvastatin     Muscle pain in legs    Patient Measurements: Height: '5\' 9"'$  (175.3 cm) Weight: 101.9 kg (224 lb 10.4 oz) IBW/kg (Calculated) : 70.7 Heparin Dosing Weight: 95kg  Vital Signs: Temp: 97.6 F (36.4 C) (03/22 1200) Temp Source: Axillary (03/22 1200) BP: 95/69 (03/22 1430) Pulse Rate: 100 (03/22 1430)  Labs: Recent Labs    01/24/21 1907 01/24/21 2254 01/24/21 2304 01/25/21 0132 01/25/21 1541 01/25/2021 0430 01/31/2021 0450 01/06/2021 0808 02/02/2021 1600 01/27/2021 1630 01/27/21 0200 01/27/21 1200 01/27/21 1432  HGB  --    < > 6.9* 7.1*  --  7.1*  --  7.8*  --   --  6.5*  --  8.2*  HCT  --    < > 21.9* 23.2*  --  22.3*  --  23.0*  --   --  20.8*  --  24.0*  PLT  --    < > 278 254  --  278  --   --   --   --  241  --   --   APTT  --   --   --   --   --   --   --   --   --  69* 71* 56*  --   HEPARINUNFRC  --   --   --   --   --   --   --   --   --   --  >2.20*  --   --   CREATININE  --   --   --   --    < > 3.20*  --   --  2.60*  --  3.42*  --   --   TROPONINIHS 70*  --  64*  --   --   --  41*  --   --   --   --   --   --    < > = values in this interval not displayed.    Estimated Creatinine Clearance: 26.7 mL/min (A) (by C-G formula based on SCr  of 3.42 mg/dL (H)).   Medical History: Past Medical History:  Diagnosis Date  . Anemia    labs today indicate (09/27/11)  . Arthritis    right knee  . Bleeding ulcer    hx of, required 6 units blood  . Blood transfusion    6 units with ulcer repair surgery  . Chronic kidney disease    stage IV, has fistula, never started diaylsis  . Complication of anesthesia    little slow to wake up with last surgery  . Dyspnea    with exertion  . Gout   . Hip dislocation, left ([place]) 10/09/2020  . Hypertension   . Osteoporosis   .  Pneumonia     Assessment: 60 yo M admitted with AFL and CHF. Pt s/p failed DCCV 3/16. Pt on apixaban PTA, transitioned to heparin on 3/21 with concerns for EKG changes and likely need for ischemic evaluation and additional DCCV cardioversion.  APTT today subtherapeutic at 56 sec. Heparin running peripherally and aPTT drawn from IJ.  No signs of bleeding or complications per RN. Hgb 6.5, PRBC x1 today. Plts 241.   Goal of Therapy:  Heparin level 0.3-0.7 units/ml aPTT 66-102 seconds Monitor platelets by anticoagulation protocol: Yes   Plan:  Increase heparin gtt to 1600 units/hr Check aPTT in 8h  Monitor Hgb and Plts   Benna Dunks  PharmD Candidate, Class of 2022

## 2021-01-27 NOTE — Progress Notes (Signed)
[facility] NEPHROLOGY PROGRESS NOTE  Assessment/ Plan: Pt is a 60 y.o. yo male  With PMH of HTN, DM, HLD, obesity, CKD stage V baseline creatinine level around 5 admitted with acute diastolic CHF.  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  Started HD 3/19 however around 45 minutes into the treatment patient developed chest pain, tachycardia and worsening hypoxia.  Rapid response called and later patient was transferred to ICU.  He is grossly volume overload, systolic blood pressure around 90s and req'ed BiPAP.  Unable to ultrafiltrate with IHD therefore started CRRT on 3/19 mainly for ultrafiltration and azotemia. CRRT completed on 3/22. Plan is to hold on dialysis today and plan for dialysis via his LUE AVF at the bedside tomorrow. Tentatively planning for him to be on a MWF schedule. Would recommend maintaining his temporary HD catheter for now -He has a left radiocephalic fistula that was created in October, 2017 by Dr. Scot [name] that is mature, able to use for IHD.  Acute exacerbation of diastolic heart failure:  Started CRRT mainly for ultrafiltration.  Volume status/CXR improved.   Hypertension: On metoprolol for atrial flutter. BP on softer side now, possibly from precedex? Precedex now stopped  Anemia of chronic disease: Iron deficiency therefore ordered IV iron and ESA on 3/17.  Continue ESA.  Transfuse for hgb <7, agree with 1U prbc today  Secondary hyperparathyroidism: Both calcium and phosphorus level elevated.  Discontinued MVA and hold any vitamin D.  Increased Auryxia yesterday but currently n.p.o.  Now expect to improve with RRT.  Aflutter -cardiology following. On eliquis  Subjective: Seen and examined in ICU. Off CRRT since yesterday afternoon. Required precedex for agitation overnight. 1U prbc ordered for hgb 6.5 by primary service. SBP in the 90's currently therefore precedex stopped. Drowsy this AM but no complaints  when prompted. He reports that his breathing is better as compared to before. Objective Vital signs in last 24 hours: Vitals:   01/27/21 0810 01/27/21 0830 01/27/21 0835 01/27/21 0900  BP: (!) 87/65 91/65 (!) 87/64 (!) 85/60  Pulse: 88 89 88 88  Resp: (!) 30 (!) 23 (!) 26 (!) 24  Temp: 98.3 F (36.8 C)  98.5 F (36.9 C)   TempSrc: Axillary  Axillary   SpO2: 100% 100% 100% 100%  Weight:      Height:       Weight change: -6.4 kg  Intake/Output Summary (Last 24 hours) at 01/27/2021 1001 Last data filed at 01/27/2021 0900 Gross per 24 hour  Intake 1247.17 ml  Output 1892 ml  Net -644.83 ml       Labs: Basic Metabolic Panel: Recent Labs  Lab 01/17/2021 0430 01/16/2021 0808 01/15/2021 1600 01/27/21 0200  NA 133* 136 132* 133*  K 4.4 4.4 4.5 4.8  CL 98  --  98 99  CO2 23  --  24 25  GLUCOSE 131*  --  160* 160*  BUN 74*  --  52* 67*  CREATININE 3.20*  --  2.60* 3.42*  CALCIUM 10.7*  --  11.2* 12.4*  PHOS 4.6  --  4.9* 6.6*   Liver Function Tests: Recent Labs  Lab 01/24/21 0303 01/25/21 1541 01/20/2021 0430 02/01/2021 1600 01/27/21 0200  AST 22  --   --   --   --   ALT 15  --   --   --   --   ALKPHOS 68  --   --   --   --  BILITOT 1.2  --   --   --   --   PROT 7.6  --   --   --   --   ALBUMIN 2.4*   < > 2.3* 2.4* 2.2*   < > = values in this interval not displayed.   No results for input(s): LIPASE, AMYLASE in the last 168 hours. No results for input(s): AMMONIA in the last 168 hours. CBC: Recent Labs  Lab 01/24/21 0303 01/24/21 2254 01/24/21 2304 01/25/21 0132 02/03/2021 0430 01/18/2021 0808 01/27/21 0200  WBC 18.6*  --  19.8* 20.3* 21.3*  --  21.0*  NEUTROABS 15.8*  --   --  17.0* 18.4*  --   --   HGB 7.3*   < > 6.9* 7.1* 7.1* 7.8* 6.5*  HCT 23.2*   < > 21.9* 23.2* 22.3* 23.0* 20.8*  MCV 100.4*  --  100.9* 100.9* 100.0  --  102.5*  PLT 256  --  278 254 278  --  241   < > = values in this interval not displayed.   Cardiac Enzymes: Recent Labs  Lab  01/24/21 0303  CKTOTAL 22*   CBG: No results for input(s): GLUCAP in the last 168 hours.  Iron Studies:  No results for input(s): IRON, TIBC, TRANSFERRIN, FERRITIN in the last 72 hours. Studies/Results: DG CHEST PORT 1 VIEW  Result Date: 01/08/2021 CLINICAL DATA:  Respiratory failure EXAM: PORTABLE CHEST 1 VIEW COMPARISON:  01/25/2021 FINDINGS: Central line unchanged with tip at high SVC. Patient rotated right. Marked cardiomegaly. No pleural effusion or pneumothorax. The Chin overlies the apices. Resolved interstitial edema with mild pulmonary venous congestion remaining. Suboptimal evaluation of the lung bases secondary to patient's size, technique, and the extent of cardiomegaly. IMPRESSION: Cardiomegaly with resolution of congestive heart failure. Possible bibasilar airspace disease, suboptimally evaluated. Electronically Signed   By: Abigail Miyamoto M.D.   On: 01/13/2021 09:00   DG CHEST PORT 1 VIEW  Result Date: 01/25/2021 CLINICAL DATA:  Central line placement. EXAM: PORTABLE CHEST 1 VIEW COMPARISON:  January 24, 2021 FINDINGS: Right central venous line is identified with distal tip in superior vena cava. There is no pneumothorax. The heart size is enlarged. Increased pulmonary interstitium is identified bilaterally. Osseous structures are stable. IMPRESSION: Right central venous line distal tip in the superior vena cava. No pneumothorax. Congestive heart failure Electronically Signed   By: Abelardo Diesel M.D.   On: 01/25/2021 12:05    Medications: Infusions:   prismasol BGK 4/2.5 500 mL/hr at 01/17/2021 0808    prismasol BGK 4/2.5 300 mL/hr at 01/23/2021 0516   amiodarone 30 mg/hr (01/27/21 0900)   ceFEPime (MAXIPIME) IV Stopped (02/01/2021 2230)   ferric gluconate (FERRLECIT/NULECIT) IV 250 mg (01/20/2021 1806)   heparin 1,400 Units/hr (01/27/21 0900)   prismasol BGK 4/2.5 1,500 mL/hr at 02/05/2021 1456    Scheduled Medications:  sodium chloride   Intravenous Once   Chlorhexidine  Gluconate Cloth  6 each Topical Q0600   clindamycin   Topical BID   darbepoetin (ARANESP) injection - NON-DIALYSIS  150 mcg Subcutaneous Q Thu-1800   diclofenac Sodium  2 g Topical QID   ferric citrate  630 mg Oral TID WC   loratadine  10 mg Oral Daily   midodrine  15 mg Oral TID WC   pantoprazole  20 mg Oral Daily   sodium chloride flush  10-40 mL Intracatheter Q12H   tamsulosin  0.4 mg Oral QPC supper   vitamin B-12  1,000 mcg Oral  Daily    have reviewed scheduled and prn medications.  Physical Exam: General: With BiPAP,drowsy Heart: s1s2, irreg irreg Lungs: diminished air entry bibasilar, no w/r/r/c, unlabored, on bipap Abdomen:soft, Non-tender, distended Extremities: 1+ Bilateral lower extremity edema Neuro: drowsy but responds to vocal and tactile stimuli, moves all ext spontaneously Dialysis Access: LUE AV fistula has good thrill and bruit  Mark [name] 01/27/2021,10:01 AM  LOS: 6 days

## 2021-01-27 NOTE — Progress Notes (Signed)
OT Cancellation Note  Patient Details Name: Mark [name] MRN: [id number] DOB: November 29, 1960   Cancelled Treatment:    Reason Eval/Treat Not Completed: Patient not medically ready RN reports too sleepy/ inappropriate to attempt at this time with blood net yet transfused)  [name], OTR/L Acute Rehab Services Pager [phone] Office [phone]     [name] 01/27/2021, 9:33 AM

## 2021-01-27 NOTE — Progress Notes (Signed)
Pharmacy Antibiotic Note  Mark [name] is a 60 y.o. male with UTI and staph epi bacteremia.  Pharmacy has been consulted for Vancomycin dosing. Patient is also on cefepime for proteus penneri UTI.   Patient was originally started on CRRT, now transitioning to HD with a MWF schedule. Vanc random level today therapeutic at 21 after being off CRRT since yesterday 3/21 at 1600. Patient is afebrile w/ recent WBC of 21.  Plan: D/c vancomycin IV 1g every 24 hours  Transition to Vancomycin variable dosing per renal function - planning vanc 1,'000mg'$  post-HD on MWF (starting 2/23)  Follow HD plans, antibiotic de-escalation and LOT  Height: '5\' 9"'$  (175.3 cm) Weight: 101.9 kg (224 lb 10.4 oz) IBW/kg (Calculated) : 70.7  Temp (24hrs), Avg:97.7 F (36.5 C), Min:96.3 F (35.7 C), Max:98.5 F (36.9 C)  Recent Labs  Lab 01/23/21 1509 01/24/21 0303 01/24/21 2304 01/25/21 0132 01/25/21 1541 01/09/2021 0430 01/30/2021 1600 01/27/21 0200 01/27/21 1300  WBC  --  18.6* 19.8* 20.3*  --  21.3*  --  21.0*  --   CREATININE  --  5.75*  --   --  4.20* 3.20* 2.60* 3.42*  --   LATICACIDVEN 1.2  --   --  1.5  --   --   --   --   --   VANCORANDOM  --   --   --   --   --   --   --   --  21    Estimated Creatinine Clearance: 26.7 mL/min (A) (by C-G formula based on SCr of 3.42 mg/dL (H)).    Allergies  Allergen Reactions  . Penicillins Swelling, Rash and Other (See Comments)    Arms and eyes swell & skin breaks out  Has patient had a PCN reaction causing immediate rash, facial/tongue/throat swelling, SOB or lightheadedness with hypotension: Yes Has patient had a PCN reaction causing severe rash involving mucus membranes or skin necrosis: Yes Has patient had a PCN reaction that required hospitalization: No Has patient had a PCN reaction occurring within the last 10 years: No If all of the above answers are "NO", then may proceed with Cephalosporin use.   . Atorvastatin     Muscle pain in legs  .  Rosuvastatin     Muscle pain in legs    Antimicrobials this admission: 3/19 Ceftriaxone >> 3/21 3/21 Cefepime >> 3/19 Vanc >>   Microbiology results: 3/18 BCx: 2/2 staph epi (+mecA resistance) 3/17 UCx: proteus penneri (cefepime sens) 3/19 MRSA PCR: negative  Thank you for allowing pharmacy to be a part of this patient's care.  Mercy Riding, PharmD PGY1 Acute Care Pharmacy Resident Please refer to [facility] for unit-specific pharmacist

## 2021-01-27 NOTE — Progress Notes (Signed)
[place] for heparin (Eliquis on hold) Indication: atrial fibrillation  Allergies  Allergen Reactions  . Penicillins Swelling, Rash and Other (See Comments)    Arms and eyes swell & skin breaks out  Has patient had a PCN reaction causing immediate rash, facial/tongue/throat swelling, SOB or lightheadedness with hypotension: Yes Has patient had a PCN reaction causing severe rash involving mucus membranes or skin necrosis: Yes Has patient had a PCN reaction that required hospitalization: No Has patient had a PCN reaction occurring within the last 10 years: No If all of the above answers are "NO", then may proceed with Cephalosporin use.   . Atorvastatin     Muscle pain in legs  . Rosuvastatin     Muscle pain in legs    Patient Measurements: Height: '5\' 9"'$  (175.3 cm) Weight: 101.9 kg (224 lb 10.4 oz) IBW/kg (Calculated) : 70.7 Heparin Dosing Weight: 95kg  Vital Signs: Temp: 98.2 F (36.8 C) (03/21 2359) Temp Source: Axillary (03/21 2359) BP: 95/67 (03/22 0500) Pulse Rate: 95 (03/22 0500)  Labs: Recent Labs    01/24/21 1907 01/24/21 2254 01/24/21 2304 01/25/21 0132 01/25/21 1541 01/07/2021 0430 01/08/2021 0450 01/08/2021 0808 01/18/2021 1600 01/28/2021 1630 01/27/21 0200  HGB  --    < > 6.9* 7.1*  --  7.1*  --  7.8*  --   --  6.5*  HCT  --    < > 21.9* 23.2*  --  22.3*  --  23.0*  --   --  20.8*  PLT  --    < > 278 254  --  278  --   --   --   --  241  APTT  --   --   --   --   --   --   --   --   --  69* 71*  HEPARINUNFRC  --   --   --   --   --   --   --   --   --   --  >2.20*  CREATININE  --   --   --   --    < > 3.20*  --   --  2.60*  --  3.42*  TROPONINIHS 70*  --  64*  --   --   --  41*  --   --   --   --    < > = values in this interval not displayed.    Estimated Creatinine Clearance: 26.7 mL/min (A) (by C-G formula based on SCr of 3.42 mg/dL (H)).   Medical History: Past Medical History:  Diagnosis Date  . Anemia     labs today indicate (09/27/11)  . Arthritis    right knee  . Bleeding ulcer    hx of, required 6 units blood  . Blood transfusion    6 units with ulcer repair surgery  . Chronic kidney disease    stage IV, has fistula, never started diaylsis  . Complication of anesthesia    little slow to wake up with last surgery  . Dyspnea    with exertion  . Gout   . Hip dislocation, left ([place]) 10/09/2020  . Hypertension   . Osteoporosis   . Pneumonia     Assessment: 44 yoM admitted with AFL and CHF. Pt s/p failed DCCV 3/16. Pt on apixaban PTA which has been continued, now to transition to heparin with concerns for EKG changes and likely need for  an ischemic evaluation.   Last apixaban dose was ~2200 on 3/21 but plan is to start heparin to try to prevent subtherapeutic anticoagulation with recent DCCV + possible need for another DCCV.  APTT this morning within goal range. Heparin running peripherally and aPTT drawn from central line.  No overt bleeding or complications noted. Hgb low, plt wnl.   Goal of Therapy:  Heparin level 0.3-0.7 units/ml aPTT 66-102 seconds Monitor platelets by anticoagulation protocol: Yes   Plan:  Continue heparin 1400 units/hr Check aPTT in 8h Daily heparin level and aPTT  Mercy Riding, PharmD PGY1 Acute Care Pharmacy Resident Please refer to [facility] for unit-specific pharmacist  [facility] pharmacy phone numbers are listed on [email or web address]

## 2021-01-27 NOTE — Progress Notes (Signed)
[place] Progress Note Patient Name: Mark [name] DOB: [birth year] MRN: [id number]   Date of Service  01/27/2021  HPI/Events of Note  Notified that patient is restless on BiPap ABG 7.4/38/122 QTc 435, BP 120/74  eICU Interventions  Will start low dose Precedex to ensure compliance with BiPap     Intervention Category Minor Interventions: Agitation / anxiety - evaluation and management  Shona Needles Rolena Knutson 01/27/2021, 2:26 AM

## 2021-01-27 NOTE — Progress Notes (Addendum)
NAME:  Mark [name], MRN:  [id number], DOB:  06-02-1961, LOS: 6 ADMISSION DATE:  01/27/2021, CONSULTATION DATE: 3/19 REFERRING MD:  Dr. Erlinda Hong, CHIEF COMPLAINT:  TRH    History of Present Illness:  60 y/o M who presented to [facility] on 3/16 with shortness of breath, leg swelling and palpitations.   The patient has known CKD IV with baseline sr cr of 5, conservative management, L arm AVF in place in anticipation of HD.  He was brought in by Cardiology 3/16 for elective cardioversion.  On presentation, he was found to have significant volume overload needing admission per TRH.  After cardioversion he became transiently hypotensive and briefly required neosynephrine gtt.  He was admitted and Nephrology was consulted. He was diuresed with 1.5L UOP. Urinalysis grew 100k proteus, sensitivities pending. He was started on rocephin.  The patient remained on minimal O2. He had intermittent complaints of shoulder / left chest pain.  There were concerns for possible blood in stool, FOBT pending.   During his initial iHD on 3/19 evening, the patient began having chest pain and shortness of breath approximately 45 minutes into the therapy with HR into the 140's.  He was placed on a NRB and HD was stopped. He was transferred back to his room and placed on BiPAP. ABG was obtained which showed an acute hypercarbic respiratory failure (7.154 / 71).  CXR showed marked cardiomegaly with basilar atelectasis. RRT was called to assess the patient.  Cardiology was consulted, he was given 120 mg IV lasix and recommended to be started on an amiodarone infusion.  Troponin was assessed and 70, lactic acid 1.2.  The patient was transferred to ICU for further observation.     Pertinent  Medical History  CKD IV - HD started this admit (01/07/2021) HTN HLD Chronic Diastolic CHF  Moderate AS Atrial Flutter - on Eliquis  Obesity  Anemia of Chronic Disease   Significant Hospital Events: Including procedures, antibiotic start and stop  dates in addition to other pertinent events    3/16 Admit after cardioversion, SOB, leg swelling / palpitations prior to presentation   3/19 PCCM consulted   3/20 CRRT started  3/21 Improved mental status, wore BiPAP overnight. Staph epidermis growing from [facility]. UC growing proteus, abx changed from ceftriaxone to cefepime.  Fever 100.2, -3.6L in last 24 hours.  CVVHD stopped.  Interim History / Subjective:  Afebrile / WBC 21 Glucose range 131-160  I/O 111m UOP, 2.3L UF for -973min last 24 hours  On BiPAP, amiodarone + heparin gtt Hgb down to 6.5, 1 unit blood transfusing  Put on precedex for agitation overnight > drowsy this am   Objective   Blood pressure (!) 87/64, pulse 88, temperature 98.5 F (36.9 C), temperature source Axillary, resp. rate (!) 26, height '5\' 9"'$  (1.753 m), weight 101.9 kg, SpO2 100 %.    FiO2 (%):  [30 %] 30 %   Intake/Output Summary (Last 24 hours) at 01/27/2021 0844 Last data filed at 01/27/2021 0700 Gross per 24 hour  Intake 1479.99 ml  Output 2277 ml  Net -797.01 ml   Filed Weights   01/25/21 0500 01/28/2021 0500 01/27/21 0530  Weight: 108.5 kg 108.3 kg 101.9 kg    Examination: General: chronically ill appearing adult male lying in bed in NAD on BiPAP  HEENT: MM pink/moist, mask in place, anicteric, pupils equal / reactive  Neuro: drowsy, awakens to voice, MAE, follows commands, drifts back to sleep CV: s1s2 irr irr, AF 80's on  monitor, no m/r/g PULM: non-labored on BiPAP, lungs bilaterally clear GI: soft, bsx4 active  Extremities: warm/dry, 1+ pre-tibial pitting edema  Skin: circular raised lesion on suprapubic area / with drainage VVS: L AVF with positive thrill / bruit   Lab/imaging reviewed    Resolved Hospital Problem list     Assessment & Plan:   Acute Hypercarbic Respiratory Failure in setting of Acute Diastolic CHF  -BiPAP QHS + PRN daytime sleep -avoid sedating medications  -volume removal per HD  -follow intermittent  CXR -pulmonary hygiene - IS, mobilize  -question if he has underlying OSA/OHS, consider outpatient sleep study   CKD V / New ESRD  HD initiated this admit, did not tolerate initial HD due to chest pain -appreciate Nephrology input  -HD for volume removal  -Trend BMP / urinary output -Replace electrolytes as indicated  Acute Exacerbation of dCHF Atrial Flutter  Chronic Hypotension S/P DCCV 3/16 with brief hypotension requiring neo, had some chest pain then.  On eliquis for AFlutter.  -appreciate Cardiology evaluation  -continue heparin gtt, midodrone  -monitor off lopressor  -defer timing of repeat cardioversion attempt to Cardiology, needs improved volume status  Macrocytic Anemia  Reported red stool, hx of gastric ulcer. S/P PRVC 3/19 -trend CBC  -transfuse for Hgb <7%, one unit PRBC 3/22  -continue B12    BPH -flomax    Proteus UTI Perineal Abscess, Right Thigh Wound  CNS Bacteremia ? Contaminant with 2/4 bottles  -continue cefepime, vancomycin  -appreciate CCS evaluation of wounds > rec's for topical clindamycin  Left Shoulder Pain  -consider follow up evaluation once more stable   Best practice (evaluated daily)  Diet:  NPO Pain/Anxiety/Delirium protocol (if indicated): No VAP protocol (if indicated): Not indicated DVT prophylaxis: Systemic AC GI prophylaxis: N/A Glucose control:  Basal insulin No Central venous access:  Yes, and it is still needed Arterial line:  N/A Foley:  N/A Mobility:  bed rest  PT consulted: N/A Last date of multidisciplinary goals of care discussion:  Code Status:  full code Disposition: ICU   Brother, [name], updated via phone 3/21.  Will update on arrival 3/22.   Critical care time:     Noe Gens, MSN, APRN, NP-C, AGACNP-BC [facility] 01/27/2021, 8:44 AM  Please see [email or web address] for pager details.   From 7A-7P if no response, please call [phone] After hours, please call Warren Lacy  [phone]   Pulmonary critical care attending:  This is a 60 year old presented with shortness of breath and leg swelling.  Patient has CKD 4 progressed CKD 5 was requiring CVVHD yesterday.  Has a urinary tract infection as well as hidradenitis draining.  Also has staph bacteremia.  He was agitated last night and placed on Precedex.  He is much more somnolent this morning.  Precedex has been stopped.  CVVHD was stopped yesterday.  With attempts to consider HD per nephrology.  BP (!) 87/66   Pulse 90   Temp 98.5 F (36.9 C) (Axillary)   Resp (!) 28   Ht '5\' 9"'$  (1.753 m)   Wt 101.9 kg   SpO2 100%   BMI 33.17 kg/m   General: Chronically ill-appearing gentleman, on BiPAP HEENT: BiPAP in place, right dialysis catheter in the internal jugular Heart: Regular rate rhythm S1-S2 distant heart tones Chest: Clear to auscultation bilaterally no crackles no wheeze Abdomen: Soft nontender nondistended, obese  Labs reviewed: Hemoglobin 6.5  Assessment: Acute hypoxemic hypercarbic respiratory failure requiring BiPAP Acute diastolic heart failure, fluid overload, CKD  5 Atrial flutter, on anticoagulation, anemia, no evidence of active bleeding Proteus urinary tract infection Perineal abscesses, 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 Chronically hypotensive to encephalopathic to take midodrine this morning. I am unsure if he is on a tolerate HD.  This patient is critically ill with multiple organ system failure; which, requires frequent high complexity decision making, assessment, support, evaluation, and titration of therapies. This was completed through the application of advanced monitoring technologies and extensive  interpretation of multiple databases. During this encounter critical care time was devoted to patient care services described in this note for 32 minutes.  Garner Nash, DO [facility] 01/27/2021 10:49 AM

## 2021-01-27 NOTE — Progress Notes (Signed)
Progress Note  Patient Name: Mark [name] Date of Encounter: 01/27/2021  [facility] Cardiologist: [name], MD   Subjective   Overnight Hgb noted to be 6.5, down from 7.8. Transfused 1 unit PRBC, no active bleeding noted. Also noted to be agitated on Bipap, started on precedex. This AM, he remains on Bipap and is sleeping comfortably. No complaints.  Inpatient Medications    Scheduled Meds: . sodium chloride   Intravenous Once  . Chlorhexidine Gluconate Cloth  6 each Topical Q0600  . clindamycin   Topical BID  . darbepoetin (ARANESP) injection - NON-DIALYSIS  150 mcg Subcutaneous Q Thu-1800  . diclofenac Sodium  2 g Topical QID  . ferric citrate  630 mg Oral TID WC  . loratadine  10 mg Oral Daily  . midodrine  15 mg Oral TID WC  . pantoprazole  20 mg Oral Daily  . sodium chloride flush  10-40 mL Intracatheter Q12H  . tamsulosin  0.4 mg Oral QPC supper  . vitamin B-12  1,000 mcg Oral Daily   Continuous Infusions: .  prismasol BGK 4/2.5 500 mL/hr at 01/12/2021 0808  .  prismasol BGK 4/2.5 300 mL/hr at 01/09/2021 0516  . amiodarone 30 mg/hr (01/27/21 0900)  . ceFEPime (MAXIPIME) IV Stopped (01/22/2021 2230)  . ferric gluconate (FERRLECIT/NULECIT) IV 250 mg (01/31/2021 1806)  . heparin 1,400 Units/hr (01/27/21 0900)  . prismasol BGK 4/2.5 1,500 mL/hr at 02/02/2021 1456   PRN Meds: acetaminophen **OR** acetaminophen, heparin, heparin, Muscle Rub, polyethylene glycol, sodium chloride flush, traMADol   Vital Signs    Vitals:   01/27/21 0810 01/27/21 0830 01/27/21 0835 01/27/21 0900  BP: (!) 87/65 91/65 (!) 87/64 (!) 85/60  Pulse: 88 89 88 88  Resp: (!) 30 (!) 23 (!) 26 (!) 24  Temp: 98.3 F (36.8 C)  98.5 F (36.9 C)   TempSrc: Axillary  Axillary   SpO2: 100% 100% 100% 100%  Weight:      Height:        Intake/Output Summary (Last 24 hours) at 01/27/2021 0957 Last data filed at 01/27/2021 0900 Gross per 24 hour  Intake 1404.64 ml  Output 2093 ml  Net  -688.36 ml   Last 3 Weights 01/27/2021 01/25/2021 01/25/2021  Weight (lbs) 224 lb 10.4 oz 238 lb 12.1 oz 239 lb 3.2 oz  Weight (kg) 101.9 kg 108.3 kg 108.5 kg      Telemetry    Around 3 AM, converted to sinus rhythm with 1st degree AV block (PR very long, about 420 ms) - Personally Reviewed  ECG    Pending repeat today - Personally Reviewed  Physical Exam   GEN: Well nourished, appears chronically ill, on bipap in no acute distress NECK: R IJ dialysis catheter in place CARDIAC: regular rhythm, normal S1 and S2, no rubs or gallops. No murmur appreciated. VASCULAR: Radial pulses 2+ bilaterally. AV fistula in place RESPIRATORY:  Bipap breath sounds ABDOMEN: Soft, non-tender, non-distended MUSCULOSKELETAL: moves all 4 limbs independently SKIN: Warm and dry, trivial LE edema NEUROLOGIC:  Appears comfortable, awakens to voice  Labs    High Sensitivity Troponin:   Recent Labs  Lab 01/24/21 1907 01/24/21 2304 02/02/2021 0450  TROPONINIHS 70* 64* 41*      Chemistry Recent Labs  Lab 01/24/21 0303 01/24/21 2254 01/13/2021 0430 02/01/2021 0808 01/14/2021 1600 01/27/21 0200  NA 132*   < > 133* 136 132* 133*  K 4.4   < > 4.4 4.4 4.5 4.8  CL 95*   < >  98  --  98 99  CO2 23   < > 23  --  24 25  GLUCOSE 145*   < > 131*  --  160* 160*  BUN 141*   < > 74*  --  52* 67*  CREATININE 5.75*   < > 3.20*  --  2.60* 3.42*  CALCIUM 11.6*   < > 10.7*  --  11.2* 12.4*  PROT 7.6  --   --   --   --   --   ALBUMIN 2.4*   < > 2.3*  --  2.4* 2.2*  AST 22  --   --   --   --   --   ALT 15  --   --   --   --   --   ALKPHOS 68  --   --   --   --   --   BILITOT 1.2  --   --   --   --   --   GFRNONAA 10*   < > 21*  --  27* 20*  ANIONGAP 14   < > 12  --  10 9   < > = values in this interval not displayed.     Hematology Recent Labs  Lab 01/25/21 0132 01/18/2021 0430 02/03/2021 0808 01/27/21 0200  WBC 20.3* 21.3*  --  21.0*  RBC 2.30* 2.23*  --  2.03*  HGB 7.1* 7.1* 7.8* 6.5*  HCT 23.2* 22.3* 23.0*  20.8*  MCV 100.9* 100.0  --  102.5*  MCH 30.9 31.8  --  32.0  MCHC 30.6 31.8  --  31.3  RDW 15.2 15.2  --  15.8*  PLT 254 278  --  241    BNPNo results for input(s): BNP, PROBNP in the last 168 hours.   DDimer No results for input(s): DDIMER in the last 168 hours.   Radiology    DG CHEST PORT 1 VIEW  Result Date: 01/18/2021 CLINICAL DATA:  Respiratory failure EXAM: PORTABLE CHEST 1 VIEW COMPARISON:  01/25/2021 FINDINGS: Central line unchanged with tip at high SVC. Patient rotated right. Marked cardiomegaly. No pleural effusion or pneumothorax. The Chin overlies the apices. Resolved interstitial edema with mild pulmonary venous congestion remaining. Suboptimal evaluation of the lung bases secondary to patient's size, technique, and the extent of cardiomegaly. IMPRESSION: Cardiomegaly with resolution of congestive heart failure. Possible bibasilar airspace disease, suboptimally evaluated. Electronically Signed   By: Abigail Miyamoto M.D.   On: 01/25/2021 09:00   DG CHEST PORT 1 VIEW  Result Date: 01/25/2021 CLINICAL DATA:  Central line placement. EXAM: PORTABLE CHEST 1 VIEW COMPARISON:  January 24, 2021 FINDINGS: Right central venous line is identified with distal tip in superior vena cava. There is no pneumothorax. The heart size is enlarged. Increased pulmonary interstitium is identified bilaterally. Osseous structures are stable. IMPRESSION: Right central venous line distal tip in the superior vena cava. No pneumothorax. Congestive heart failure Electronically Signed   By: Abelardo Diesel M.D.   On: 01/25/2021 12:05    Cardiac Studies   Echocardiogram 10/09/2020: 1. No subcostal views.  2. Left ventricular ejection fraction, by estimation, is 60 to 65%. The  left ventricle has normal function. The left ventricle has no regional  wall motion abnormalities. There is mild left ventricular hypertrophy.  Left ventricular diastolic parameters  are indeterminate.  3. Right ventricular systolic  function is normal. The right ventricular  size is normal.  4. Left atrial size was severely dilated.  5. The mitral valve is normal in structure. Mild mitral valve  regurgitation. No evidence of mitral stenosis. Severe mitral annular  calcification.  6. The aortic valve is normal in structure. Aortic valve regurgitation is  trivial. Moderate aortic valve stenosis.   DCCV 01/14/2021 Evaluation: Findings: Post procedure EKG shows:Sinus with mobitz 1 AVB and PACs Complications:None  After the procedure, the patient was hypotensive requiring pressor support. Was also notably overloaded on physical exam. Given tenuous status, decision was made to admit to in-patient for pressor support and IV diuresis. Plan discussed with admitting physician, Dr. Ellyn Hack and primary cardiologist Dr. Margaretann Loveless.  Patient Profile     60 y.o. male with a history of moderate AS, CKD V, HTN, gout, persistent atrial flutter, DM2, and hx of hip dislocations and recent acute on chronic diastolic CHF with acute respiratory failure>> found to be in atrial flutter. Plan was for OP DCCV>>scheduled for 01/19/2021. On arrival pt found to be severely volume overloaded, was hypotensive post procedure requiring admission.  Assessment & Plan    Typical atrial flutter with variable block: with coarse flutter/fib on telemetry as well -outpatient DCCV 01/25/2021, initially converted to sinus with Mobitz 1 AV block, PACs/PVCs. Returned to atrial flutter -based on telemetry, converted back to sinus rhythm with very long PR around 3 AM 01/27/21 -converted to heparin on 3/21 due to concern for ST changes. Now requiring transfusion for drop in Hgb. Should remain on anticoagulation due to recent cardioversion unless absolutely necessary to stop. Will keep heparin today until sure there is no active blood loss, then can restart apixaban CHA2DS2/VAS Stroke Risk Points=3 -continue IV amiodarone while on bipap, if does well can convert to  oral -as he is in sinus right now, no longer needs cardioversion, would aim for control with amiodarone  Acute on chronic diastolic heart failure CKD stage V progressed to ESRD -appreciate management per nephrology -CRRT stopped 3/21, plan for IHD vs CVVH per nephrology -no blood pressure room for additional medications, volume management per nephrology  Hypotension -on midodrine  Anemia of chronic disease -Hgb 7.8 -> 6.5, awaiting recheck post transfusion -needs anticoagulation given recent cardioversion unless there is life threatening bleeding  Moderate aortic stenosis -murmur present  Urinary tract infection, chronic right thigh wound, ? coag negative staph bacteremia vs contaminant -on cefepime -per primary team  Acute hypercarbic respiratory failure -on BiPAP this AM -appreciate PCCM management   CRITICAL CARE Patient is critically ill with multiple organ systems affected and requires high complexity decision making. Total critical care time: 35 minutes. This time includes gathering of history, evaluation of patient's response to treatment, examination of patient, review of laboratory and imaging studies, and coordination with consultants. Greater than 50% of time spent in direct patient care.  For questions or updates, please contact [place] Please consult [email or web address] for contact info under     Signed, Buford Dresser, MD  01/27/2021, 9:57 AM

## 2021-01-27 NOTE — Progress Notes (Signed)
PT Cancellation Note  Patient Details Name: Mark [name] MRN: [id number] DOB: 1961-01-20   Cancelled Treatment:    Reason Eval/Treat Not Completed: Patient not medically ready (RN reports too sleepy/ inappropriate to attempt at this time with blood net yet transfused)   Wiliam Cauthorn B Kush [place] 01/27/2021, 8:32 AM  [name], PT Acute Rehabilitation Services Pager: [phone] Office: [phone]

## 2021-01-27 NOTE — Progress Notes (Signed)
eLink Physician-Brief Progress Note Patient Name: KASHE TOOTHMAN DOB: 09-Sep-1961 MRN: [id number]   Date of Service  01/27/2021  HPI/Events of Note  Notified of Hgb 6.5 no report of active bleed Seen asleep on BiPap  eICU Interventions  Ordered to transfuse 1 unit PRBC, slow transfusion     Intervention Category Major Interventions: Other:  Judd Lien 01/27/2021, 5:41 AM

## 2021-01-28 ENCOUNTER — Inpatient Hospital Stay (HOSPITAL_COMMUNITY): Admission: RE | Admit: 2021-01-28 | Payer: 59 | Source: Ambulatory Visit

## 2021-01-28 ENCOUNTER — Inpatient Hospital Stay (HOSPITAL_COMMUNITY): Payer: 59

## 2021-01-28 DIAGNOSIS — E8779 Other fluid overload: Secondary | ICD-10-CM | POA: Diagnosis not present

## 2021-01-28 DIAGNOSIS — R0603 Acute respiratory distress: Secondary | ICD-10-CM

## 2021-01-28 DIAGNOSIS — J9602 Acute respiratory failure with hypercapnia: Secondary | ICD-10-CM | POA: Diagnosis not present

## 2021-01-28 DIAGNOSIS — I5033 Acute on chronic diastolic (congestive) heart failure: Secondary | ICD-10-CM | POA: Diagnosis not present

## 2021-01-28 DIAGNOSIS — I484 Atypical atrial flutter: Secondary | ICD-10-CM | POA: Diagnosis not present

## 2021-01-28 LAB — POCT I-STAT 7, (LYTES, BLD GAS, ICA,H+H)
Acid-base deficit: 1 mmol/L (ref 0.0–2.0)
Bicarbonate: 23.5 mmol/L (ref 20.0–28.0)
Calcium, Ion: 1.48 mmol/L — ABNORMAL HIGH (ref 1.15–1.40)
HCT: 26 % — ABNORMAL LOW (ref 39.0–52.0)
Hemoglobin: 8.8 g/dL — ABNORMAL LOW (ref 13.0–17.0)
O2 Saturation: 99 %
Patient temperature: 96.8
Potassium: 4 mmol/L (ref 3.5–5.1)
Sodium: 134 mmol/L — ABNORMAL LOW (ref 135–145)
TCO2: 25 mmol/L (ref 22–32)
pCO2 arterial: 36.9 mmHg (ref 32.0–48.0)
pH, Arterial: 7.408 (ref 7.350–7.450)
pO2, Arterial: 131 mmHg — ABNORMAL HIGH (ref 83.0–108.0)

## 2021-01-28 LAB — RENAL FUNCTION PANEL
Albumin: 2.1 g/dL — ABNORMAL LOW (ref 3.5–5.0)
Albumin: 2.4 g/dL — ABNORMAL LOW (ref 3.5–5.0)
Anion gap: 10 (ref 5–15)
Anion gap: 11 (ref 5–15)
BUN: 87 mg/dL — ABNORMAL HIGH (ref 8–23)
BUN: 53 mg/dL — ABNORMAL HIGH (ref 8–23)
CO2: 23 mmol/L (ref 22–32)
CO2: 24 mmol/L (ref 22–32)
Calcium: 11.9 mg/dL — ABNORMAL HIGH (ref 8.9–10.3)
Calcium: 11.3 mg/dL — ABNORMAL HIGH (ref 8.9–10.3)
Chloride: 98 mmol/L (ref 98–111)
Chloride: 98 mmol/L (ref 98–111)
Creatinine, Ser: 4.24 mg/dL — ABNORMAL HIGH (ref 0.61–1.24)
Creatinine, Ser: 3 mg/dL — ABNORMAL HIGH (ref 0.61–1.24)
GFR, Estimated: 15 mL/min — ABNORMAL LOW (ref >60)
GFR, Estimated: 23 mL/min — ABNORMAL LOW (ref >60)
Glucose, Bld: 112 mg/dL — ABNORMAL HIGH (ref 70–99)
Glucose, Bld: 132 mg/dL — ABNORMAL HIGH (ref 70–99)
Phosphorus: 7 mg/dL — ABNORMAL HIGH (ref 2.5–4.6)
Phosphorus: 5.3 mg/dL — ABNORMAL HIGH (ref 2.5–4.6)
Potassium: 4.7 mmol/L (ref 3.5–5.1)
Potassium: 3.9 mmol/L (ref 3.5–5.1)
Sodium: 131 mmol/L — ABNORMAL LOW (ref 135–145)
Sodium: 133 mmol/L — ABNORMAL LOW (ref 135–145)

## 2021-01-28 LAB — TYPE AND SCREEN
ABO/RH(D): O POS
ABO/RH(D): O POS
Antibody Screen: NEGATIVE
Antibody Screen: NEGATIVE
Unit division: 0
Unit division: 0

## 2021-01-28 LAB — BPAM RBC
Blood Product Expiration Date: 202204202359
Blood Product Expiration Date: 202203282359
ISSUE DATE / TIME: 202203220813
Unit Type and Rh: 5100
Unit Type and Rh: 9500

## 2021-01-28 LAB — CBC
HCT: 22.9 % — ABNORMAL LOW (ref 39.0–52.0)
Hemoglobin: 7.1 g/dL — ABNORMAL LOW (ref 13.0–17.0)
MCH: 31.3 pg (ref 26.0–34.0)
MCHC: 31 g/dL (ref 30.0–36.0)
MCV: 100.9 fL — ABNORMAL HIGH (ref 80.0–100.0)
Platelets: 220 10*3/uL (ref 150–400)
RBC: 2.27 MIL/uL — ABNORMAL LOW (ref 4.22–5.81)
RDW: 17.2 % — ABNORMAL HIGH (ref 11.5–15.5)
WBC: 22.3 10*3/uL — ABNORMAL HIGH (ref 4.0–10.5)
nRBC: 0.8 % — ABNORMAL HIGH (ref 0.0–0.2)

## 2021-01-28 LAB — GLUCOSE, CAPILLARY
Glucose-Capillary: 144 mg/dL — ABNORMAL HIGH (ref 70–99)
Glucose-Capillary: 146 mg/dL — ABNORMAL HIGH (ref 70–99)

## 2021-01-28 LAB — MAGNESIUM
Magnesium: 2.5 mg/dL — ABNORMAL HIGH (ref 1.7–2.4)
Magnesium: 2.2 mg/dL (ref 1.7–2.4)
Magnesium: 2.1 mg/dL (ref 1.7–2.4)

## 2021-01-28 LAB — APTT
aPTT: 61 seconds — ABNORMAL HIGH (ref 24–36)
aPTT: 61 seconds — ABNORMAL HIGH (ref 24–36)
aPTT: >200 seconds (ref 24–36)

## 2021-01-28 LAB — HEPARIN LEVEL (UNFRACTIONATED): Heparin Unfractionated: 1.96 IU/mL — ABNORMAL HIGH (ref 0.30–0.70)

## 2021-01-28 LAB — PHOSPHORUS: Phosphorus: 4.9 mg/dL — ABNORMAL HIGH (ref 2.5–4.6)

## 2021-01-28 MED ORDER — RENA-VITE PO TABS
1.0000 | ORAL_TABLET | Freq: Every day | ORAL | Status: DC
  Administered 2021-01-28: 1
  Filled 2021-01-28: qty 1

## 2021-01-28 MED ORDER — PROSOURCE TF PO LIQD
45.0000 mL | Freq: Three times a day (TID) | ORAL | Status: DC
  Administered 2021-01-28 – 2021-01-29 (×3): 45 mL
  Filled 2021-01-28 (×3): qty 45

## 2021-01-28 MED ORDER — ALBUMIN HUMAN 25 % IV SOLN
INTRAVENOUS | Status: AC
  Administered 2021-01-28: 25 g
  Filled 2021-01-28: qty 100

## 2021-01-28 MED ORDER — RENA-VITE PO TABS: 1.0000 | ORAL_TABLET | Freq: Every day | ORAL | Status: DC

## 2021-01-28 MED ORDER — VANCOMYCIN HCL 1000 MG/200ML IV SOLN
1000.0000 mg | INTRAVENOUS | Status: DC
  Administered 2021-01-28: 1000 mg via INTRAVENOUS
  Filled 2021-01-28: qty 200

## 2021-01-28 MED ORDER — LORATADINE 10 MG PO TABS
10.0000 mg | ORAL_TABLET | Freq: Every day | ORAL | Status: DC
  Administered 2021-01-29 – 2021-02-04 (×7): 10 mg
  Filled 2021-01-28 (×8): qty 1

## 2021-01-28 MED ORDER — APIXABAN 5 MG PO TABS
5.0000 mg | ORAL_TABLET | Freq: Two times a day (BID) | ORAL | Status: DC
  Administered 2021-01-28: 5 mg via ORAL
  Filled 2021-01-28: qty 1

## 2021-01-28 MED ORDER — MIDODRINE HCL 5 MG PO TABS
15.0000 mg | ORAL_TABLET | Freq: Three times a day (TID) | ORAL | Status: DC
  Administered 2021-01-29 – 2021-02-03 (×18): 15 mg
  Filled 2021-01-28 (×18): qty 3

## 2021-01-28 MED ORDER — APIXABAN 5 MG PO TABS
5.0000 mg | ORAL_TABLET | Freq: Two times a day (BID) | ORAL | Status: DC
  Administered 2021-01-28 – 2021-01-29 (×2): 5 mg
  Filled 2021-01-28 (×2): qty 1

## 2021-01-28 MED ORDER — VITAL 1.5 CAL PO LIQD
1000.0000 mL | ORAL | Status: DC
  Administered 2021-01-28: 1000 mL

## 2021-01-28 MED ORDER — DARBEPOETIN ALFA 150 MCG/0.3ML IJ SOSY
150.0000 ug | PREFILLED_SYRINGE | INTRAMUSCULAR | Status: DC
  Administered 2021-01-30: 150 ug via INTRAVENOUS
  Filled 2021-01-28 (×3): qty 0.3

## 2021-01-28 MED ORDER — PANTOPRAZOLE SODIUM 40 MG PO PACK
40.0000 mg | PACK | Freq: Every day | ORAL | Status: DC
  Administered 2021-01-29 – 2021-02-05 (×8): 40 mg
  Filled 2021-01-28 (×8): qty 20

## 2021-01-28 NOTE — Plan of Care (Signed)
  Problem: Education: Goal: Knowledge of General Education information will improve Description: Including pain rating scale, medication(s)/side effects and non-pharmacologic comfort measures Outcome: Progressing   Problem: Clinical Measurements: Goal: Will remain free from infection Outcome: Progressing   Problem: Clinical Measurements: Goal: Respiratory complications will improve Outcome: Progressing   Problem: Clinical Measurements: Goal: Cardiovascular complication will be avoided Outcome: Progressing   Problem: Activity: Goal: Risk for activity intolerance will decrease Outcome: Progressing

## 2021-01-28 NOTE — Procedures (Signed)
I was present at this dialysis session. I have reviewed the session itself and made appropriate changes.  Patient seen earlier in room and again during HD. Already received albumin, MAPs holding >65. Discussed with HD RN. If any changes in clinical status or hypotension, would have a low threshold to stop HD and transition back to CRRT.   Filed Weights   01/27/21 0530 01/28/21 0456 01/28/21 1105  Weight: 101.9 kg 105.7 kg 106 kg    Recent Labs  Lab 01/28/21 0348  NA 131*  K 4.7  CL 98  CO2 23  GLUCOSE 112*  BUN 87*  CREATININE 4.24*  CALCIUM 11.9*  PHOS 7.0*    Recent Labs  Lab 01/24/21 0303 01/24/21 2254 01/25/21 0132 01/20/2021 0430 01/27/2021 0808 01/27/21 0200 01/27/21 1432 01/28/21 0420  WBC 18.6*   < > 20.3* 21.3*  --  21.0*  --  22.3*  NEUTROABS 15.8*  --  17.0* 18.4*  --   --   --   --   HGB 7.3*   < > 7.1* 7.1*   < > 6.5* 8.2* 7.1*  HCT 23.2*   < > 23.2* 22.3*   < > 20.8* 24.0* 22.9*  MCV 100.4*   < > 100.9* 100.0  --  102.5*  --  100.9*  PLT 256   < > 254 278  --  241  --  220   < > = values in this interval not displayed.    Scheduled Meds: . sodium chloride   Intravenous Once  . apixaban  5 mg Oral BID  . Chlorhexidine Gluconate Cloth  6 each Topical Q0600  . clindamycin   Topical BID  . [START ON 01/30/2021] darbepoetin (ARANESP) injection - DIALYSIS  150 mcg Intravenous Q Fri-HD  . diclofenac Sodium  2 g Topical QID  . ferric citrate  630 mg Oral TID WC  . loratadine  10 mg Oral Daily  . midodrine  15 mg Oral TID WC  . pantoprazole  20 mg Oral Daily  . sodium chloride flush  10-40 mL Intracatheter Q12H  . tamsulosin  0.4 mg Oral QPC supper  . vitamin B-12  1,000 mcg Oral Daily   Continuous Infusions: . amiodarone 30 mg/hr (01/28/21 0700)  . ceFEPime (MAXIPIME) IV    . ferric gluconate (FERRLECIT/NULECIT) IV 250 mg (01/14/2021 1806)  . vancomycin     PRN Meds:.acetaminophen **OR** acetaminophen, Muscle Rub, polyethylene glycol, sodium chloride  flush, traMADol   Gean Quint, MD [facility] 01/28/2021, 12:25 PM

## 2021-01-28 NOTE — Progress Notes (Signed)
Progress Note  Patient Name: Mark [name] Date of Encounter: 01/28/2021  [facility] Cardiologist: [name], MD   Subjective   On Bipap this AM. Reports feeling short of breath.  Inpatient Medications    Scheduled Meds: . sodium chloride   Intravenous Once  . Chlorhexidine Gluconate Cloth  6 each Topical Q0600  . clindamycin   Topical BID  . [START ON 01/30/2021] darbepoetin (ARANESP) injection - DIALYSIS  150 mcg Intravenous Q Fri-HD  . diclofenac Sodium  2 g Topical QID  . ferric citrate  630 mg Oral TID WC  . loratadine  10 mg Oral Daily  . midodrine  15 mg Oral TID WC  . pantoprazole  20 mg Oral Daily  . sodium chloride flush  10-40 mL Intracatheter Q12H  . tamsulosin  0.4 mg Oral QPC supper  . vitamin B-12  1,000 mcg Oral Daily   Continuous Infusions: . amiodarone 30 mg/hr (01/28/21 0700)  . ceFEPime (MAXIPIME) IV    . ferric gluconate (FERRLECIT/NULECIT) IV 250 mg (01/13/2021 1806)  . heparin 1,800 Units/hr (01/28/21 0700)  . vancomycin     PRN Meds: acetaminophen **OR** acetaminophen, Muscle Rub, polyethylene glycol, sodium chloride flush, traMADol   Vital Signs    Vitals:   01/28/21 0700 01/28/21 0755 01/28/21 0800 01/28/21 0900  BP: 102/76  (!) 88/73 106/77  Pulse: 76 77 (!) 58 81  Resp: (!) '27 15 15 '$ (!) 23  Temp:  (!) 96.2 F (35.7 C)    TempSrc:  Axillary Axillary   SpO2: 100% 100% 100% 100%  Weight:      Height:        Intake/Output Summary (Last 24 hours) at 01/28/2021 0951 Last data filed at 01/28/2021 0700 Gross per 24 hour  Intake 1716.09 ml  Output --  Net 1716.09 ml   Last 3 Weights 01/28/2021 01/27/2021 01/13/2021  Weight (lbs) 233 lb 0.4 oz 224 lb 10.4 oz 238 lb 12.1 oz  Weight (kg) 105.7 kg 101.9 kg 108.3 kg      Telemetry     was in sinus with long PR, now in atypical slow flutter with ventricular rate control- Personally Reviewed  ECG    Sinus rhythm with sinus arrhythmia, diffuse mild ST changes- Personally  Reviewed  Physical Exam   GEN: Well nourished, chronically ill appearing, on bipap NECK: R IJ dialysis catheter in place CARDIAC: irregular rhythm, normal S1 and S2, no rubs or gallops. 2/6 systolic murmur heard today VASCULAR: Radial and DP pulses 2+ bilaterally. No carotid bruits RESPIRATORY:  Largely clear anteriorly, bipap breath sounds ABDOMEN: Soft, non-tender, non-distended SKIN: Warm and dry, trivial LE edema NEUROLOGIC:  Appears comfortable, moves limbs independently  Labs    High Sensitivity Troponin:   Recent Labs  Lab 01/24/21 1907 01/24/21 2304 02/03/2021 0450  TROPONINIHS 70* 64* 41*      Chemistry Recent Labs  Lab 01/24/21 0303 01/24/21 2254 01/27/21 0200 01/27/21 1432 01/27/21 1450 01/28/21 0348  NA 132*   < > 133* 132* 132* 131*  K 4.4   < > 4.8 4.6 4.7 4.7  CL 95*   < > 99  --  98 98  CO2 23   < > 25  --  23 23  GLUCOSE 145*   < > 160*  --  129* 112*  BUN 141*   < > 67*  --  78* 87*  CREATININE 5.75*   < > 3.42*  --  3.77* 4.24*  CALCIUM 11.6*   < >  12.4*  --  12.1* 11.9*  PROT 7.6  --   --   --   --   --   ALBUMIN 2.4*   < > 2.2*  --  2.2* 2.1*  AST 22  --   --   --   --   --   ALT 15  --   --   --   --   --   ALKPHOS 68  --   --   --   --   --   BILITOT 1.2  --   --   --   --   --   GFRNONAA 10*   < > 20*  --  17* 15*  ANIONGAP 14   < > 9  --  11 10   < > = values in this interval not displayed.     Hematology Recent Labs  Lab 01/17/2021 0430 01/15/2021 0808 01/27/21 0200 01/27/21 1432 01/28/21 0420  WBC 21.3*  --  21.0*  --  22.3*  RBC 2.23*  --  2.03*  --  2.27*  HGB 7.1*   < > 6.5* 8.2* 7.1*  HCT 22.3*   < > 20.8* 24.0* 22.9*  MCV 100.0  --  102.5*  --  100.9*  MCH 31.8  --  32.0  --  31.3  MCHC 31.8  --  31.3  --  31.0  RDW 15.2  --  15.8*  --  17.2*  PLT 278  --  241  --  220   < > = values in this interval not displayed.    BNPNo results for input(s): BNP, PROBNP in the last 168 hours.   DDimer No results for input(s):  DDIMER in the last 168 hours.   Radiology    DG CHEST PORT 1 VIEW  Result Date: 01/28/2021 CLINICAL DATA:  Pulmonary edema, shortness of breath EXAM: PORTABLE CHEST 1 VIEW COMPARISON:  Radiograph 02/05/2021 FINDINGS: Right IJ approach central venous catheter tip terminates in the mid to upper SVC. Extreme cardiomegaly is similar to priors. Persistent pulmonary vascular congestion. Increasing hazy and patchy opacities are seen in the left mid to lower lung with more confluent opacity in the right retrocardiac space as well. No pneumothorax. Obscuration of the bilateral hemidiaphragms may reflect some layering bilateral effusions though there is some partial collimation peripherally. No acute osseous or soft tissue abnormality. IMPRESSION: Worsening hazy and patchy opacities in the left mid to lower lung with more confluent opacity in the right retrocardiac space. Findings are most compatible with worsening pulmonary edema. Electronically Signed   By: Lovena Le M.D.   On: 01/28/2021 06:35    Cardiac Studies   Echocardiogram 10/09/2020: 1. No subcostal views.  2. Left ventricular ejection fraction, by estimation, is 60 to 65%. The  left ventricle has normal function. The left ventricle has no regional  wall motion abnormalities. There is mild left ventricular hypertrophy.  Left ventricular diastolic parameters  are indeterminate.  3. Right ventricular systolic function is normal. The right ventricular  size is normal.  4. Left atrial size was severely dilated.  5. The mitral valve is normal in structure. Mild mitral valve  regurgitation. No evidence of mitral stenosis. Severe mitral annular  calcification.  6. The aortic valve is normal in structure. Aortic valve regurgitation is  trivial. Moderate aortic valve stenosis.   DCCV 02/05/2021 Evaluation: Findings: Post procedure EKG shows:Sinus with mobitz 1 AVB and PACs Complications:None  After the procedure, the patient was  hypotensive requiring pressor support. Was also notably overloaded on physical exam. Given tenuous status, decision was made to admit to in-patient for pressor support and IV diuresis. Plan discussed with admitting physician, Dr. Ellyn Hack and primary cardiologist Dr. Margaretann Loveless.  Patient Profile     60 y.o. male with a history of moderate AS, CKD V, HTN, gout, persistent atrial flutter, DM2, and hx of hip dislocations and recent acute on chronic diastolic CHF with acute respiratory failure>> found to be in atrial flutter. Plan was for OP DCCV>>scheduled for 01/16/2021. On arrival pt found to be severely volume overloaded, was hypotensive post procedure requiring admission.  Assessment & Plan    Typical atrial flutter with variable block: with coarse flutter/fib on telemetry as well -outpatient DCCV 02/04/2021, initially converted to sinus with Mobitz 1 AV block, PACs/PVCs. Returned to atrial flutter -based on telemetry, converted back to sinus rhythm with very long PR around 3 AM 01/27/21. However, early 3/23 appears he is in an atypical flutter but is ventricularly rate controlled. -converted to heparin on 3/21 due to concern for ST changes. 3/22 required transfusion for drop in Hgb. Should remain on anticoagulation due to recent cardioversion unless absolutely necessary to stop. From my perspective, can return to oral anticoagulation if no other procedures planned. CHA2DS2/VAS Stroke Risk Points=3 -continue IV amiodarone while on intermittent bipap, if does well can convert to oral. Since he is paroxysmally in sinus, would aim for rhythm control medically and not repeat cardioversion.  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 coag negative staph bacteremia vs contaminant -on cefepime/vancomycin -per primary team  Acute hypercarbic respiratory failure -on BiPAP this AM -appreciate PCCM management   CRITICAL CARE Patient is critically ill with multiple organ systems affected and requires high complexity decision making. Total critical care time: 40 minutes. This time includes gathering of history, evaluation of patient's response to treatment, examination of patient, review of laboratory and imaging studies, and coordination with consultants. Greater than 50% of time spent in direct patient care.  For questions or updates, please contact [place] Please consult [email or web address] for contact info under     Signed, Buford Dresser, MD  01/28/2021, 9:51 AM

## 2021-01-28 NOTE — Progress Notes (Signed)
[facility] NEPHROLOGY PROGRESS NOTE  Assessment/ Plan: Pt is a 60 y.o. yo male  With PMH of HTN, DM, HLD, obesity, CKD stage V baseline creatinine level around 5 admitted with acute diastolic CHF.  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  Started HD 3/19 however around 45 minutes into the treatment patient developed chest pain, tachycardia and worsening hypoxia.  Rapid response called and later patient was transferred to ICU.  He is grossly volume overload, systolic blood pressure around 90s and req'ed BiPAP.  Unable to ultrafiltrate with IHD therefore started CRRT on 3/19 mainly for ultrafiltration and azotemia. CRRT completed on 3/22. -Attempt IHD today. Concerned about his BP which is on the softer side. Will UF as tolerated, however if his BP becomes more of a limiting factor, then I would advocate the re-intiation of CRRT to help off-load fluid as quickly and safely as possible. Therefore, recommend maintaining his temporary catheter for now, will utilize his AVF. -He has a left radiocephalic fistula that was created in October, 2017 by Dr. Scot Dock that is mature, able to use for IHD.  Acute exacerbation of diastolic heart failure:  Started CRRT mainly for ultrafiltration. UF with HD, see above  Pulmonary Edema -on cxr 3/23, UF plan with renal replacement therapy as above  Hypertension: On metoprolol for atrial flutter. BP on softer side now  Anemia of chronic disease: Iron deficiency therefore ordered IV iron and ESA on 3/17.  Continue ESA.  Transfuse for hgb <7  Secondary hyperparathyroidism: Both calcium and phosphorus level elevated.  Discontinued MVA and hold any vitamin D.  Increased Auryxia yesterday but currently n.p.o.  Now expect to improve with RRT.  Aflutter -cardiology following. On eliquis  Subjective: Seen and examined in ICU. Has not been able to tolerate being off bipap. Currently off bipap at the time of my  evaluation and was in resp distress, being placed back on bipap. No other complaints other than SOB. Objective Vital signs in last 24 hours: Vitals:   01/28/21 1045 01/28/21 1100 01/28/21 1105 01/28/21 1115  BP:  122/68  119/80  Pulse: 70 (!) 52  100  Resp: (!) 29 (!) 26  (!) 26  Temp:   97.7 F (36.5 C)   TempSrc:   Axillary   SpO2: 100% 99%  100%  Weight:   106 kg   Height:       Weight change: 3.8 kg  Intake/Output Summary (Last 24 hours) at 01/28/2021 1128 Last data filed at 01/28/2021 0700 Gross per 24 hour  Intake 1716.09 ml  Output --  Net 1716.09 ml       Labs: Basic Metabolic Panel: Recent Labs  Lab 01/27/21 0200 01/27/21 1432 01/27/21 1450 01/28/21 0348  NA 133* 132* 132* 131*  K 4.8 4.6 4.7 4.7  CL 99  --  98 98  CO2 25  --  23 23  GLUCOSE 160*  --  129* 112*  BUN 67*  --  78* 87*  CREATININE 3.42*  --  3.77* 4.24*  CALCIUM 12.4*  --  12.1* 11.9*  PHOS 6.6*  --  6.5* 7.0*   Liver Function Tests: Recent Labs  Lab 01/24/21 0303 01/25/21 1541 01/27/21 0200 01/27/21 1450 01/28/21 0348  AST 22  --   --   --   --   ALT 15  --   --   --   --   ALKPHOS 68  --   --   --   --  BILITOT 1.2  --   --   --   --   PROT 7.6  --   --   --   --   ALBUMIN 2.4*   < > 2.2* 2.2* 2.1*   < > = values in this interval not displayed.   No results for input(s): LIPASE, AMYLASE in the last 168 hours. No results for input(s): AMMONIA in the last 168 hours. CBC: Recent Labs  Lab 01/24/21 0303 01/24/21 2254 01/24/21 2304 01/25/21 0132 01/31/2021 0430 01/14/2021 0808 01/27/21 0200 01/27/21 1432 01/28/21 0420  WBC 18.6*  --  19.8* 20.3* 21.3*  --  21.0*  --  22.3*  NEUTROABS 15.8*  --   --  17.0* 18.4*  --   --   --   --   HGB 7.3*   < > 6.9* 7.1* 7.1*   < > 6.5* 8.2* 7.1*  HCT 23.2*   < > 21.9* 23.2* 22.3*   < > 20.8* 24.0* 22.9*  MCV 100.4*  --  100.9* 100.9* 100.0  --  102.5*  --  100.9*  PLT 256  --  278 254 278  --  241  --  220   < > = values in this  interval not displayed.   Cardiac Enzymes: Recent Labs  Lab 01/24/21 0303  CKTOTAL 22*   CBG: No results for input(s): GLUCAP in the last 168 hours.  Iron Studies:  No results for input(s): IRON, TIBC, TRANSFERRIN, FERRITIN in the last 72 hours. Studies/Results: DG CHEST PORT 1 VIEW  Result Date: 01/28/2021 CLINICAL DATA:  Pulmonary edema, shortness of breath EXAM: PORTABLE CHEST 1 VIEW COMPARISON:  Radiograph 01/14/2021 FINDINGS: Right IJ approach central venous catheter tip terminates in the mid to upper SVC. Extreme cardiomegaly is similar to priors. Persistent pulmonary vascular congestion. Increasing hazy and patchy opacities are seen in the left mid to lower lung with more confluent opacity in the right retrocardiac space as well. No pneumothorax. Obscuration of the bilateral hemidiaphragms may reflect some layering bilateral effusions though there is some partial collimation peripherally. No acute osseous or soft tissue abnormality. IMPRESSION: Worsening hazy and patchy opacities in the left mid to lower lung with more confluent opacity in the right retrocardiac space. Findings are most compatible with worsening pulmonary edema. Electronically Signed   By: Lovena Le M.D.   On: 01/28/2021 06:35    Medications: Infusions: . albumin human    . amiodarone 30 mg/hr (01/28/21 0700)  . ceFEPime (MAXIPIME) IV    . ferric gluconate (FERRLECIT/NULECIT) IV 250 mg (01/27/2021 1806)  . heparin 1,800 Units/hr (01/28/21 1114)  . vancomycin      Scheduled Medications: . sodium chloride   Intravenous Once  . apixaban  5 mg Oral BID  . Chlorhexidine Gluconate Cloth  6 each Topical Q0600  . clindamycin   Topical BID  . [START ON 01/30/2021] darbepoetin (ARANESP) injection - DIALYSIS  150 mcg Intravenous Q Fri-HD  . diclofenac Sodium  2 g Topical QID  . ferric citrate  630 mg Oral TID WC  . loratadine  10 mg Oral Daily  . midodrine  15 mg Oral TID WC  . pantoprazole  20 mg Oral Daily  .  sodium chloride flush  10-40 mL Intracatheter Q12H  . tamsulosin  0.4 mg Oral QPC supper  . vitamin B-12  1,000 mcg Oral Daily    have reviewed scheduled and prn medications.  Physical Exam: General: in mild distress Heart: s1s2, irreg irreg Lungs:  diminished air entry bibasilar w/ fine crackles, no w/r/r, labored, bl chest expansion Abdomen:soft, Non-tender, distended Extremities: 1+ Bilateral lower extremity edema Neuro: drowsy but responds to vocal and tactile stimuli, moves all ext spontaneously Dialysis Access: LUE AV fistula has good thrill and bruit  Brett Soza 01/28/2021,11:28 AM  LOS: 7 days

## 2021-01-28 NOTE — Progress Notes (Signed)
[place] for heparin (Eliquis on hold) Indication: atrial fibrillation  Allergies  Allergen Reactions  . Penicillins Swelling, Rash and Other (See Comments)    Arms and eyes swell & skin breaks out  Has patient had a PCN reaction causing immediate rash, facial/tongue/throat swelling, SOB or lightheadedness with hypotension: Yes Has patient had a PCN reaction causing severe rash involving mucus membranes or skin necrosis: Yes Has patient had a PCN reaction that required hospitalization: No Has patient had a PCN reaction occurring within the last 10 years: No If all of the above answers are "NO", then may proceed with Cephalosporin use.   . Atorvastatin     Muscle pain in legs  . Rosuvastatin     Muscle pain in legs    Patient Measurements: Height: '5\' 9"'$  (175.3 cm) Weight: 101.9 kg (224 lb 10.4 oz) IBW/kg (Calculated) : 70.7 Heparin Dosing Weight: 95kg  Vital Signs: Temp: 97.8 F (36.6 C) (03/22 1954) Temp Source: Axillary (03/22 1954) BP: 104/74 (03/23 0000) Pulse Rate: 84 (03/23 0020)  Labs: Recent Labs    01/25/21 0132 01/25/21 1541 01/30/2021 0430 01/18/2021 0450 01/25/2021 0808 01/15/2021 1600 01/19/2021 1630 01/27/21 0200 01/27/21 1200 01/27/21 1432 01/27/21 1450 01/27/21 2330  HGB 7.1*  --  7.1*  --  7.8*  --   --  6.5*  --  8.2*  --   --   HCT 23.2*  --  22.3*  --  23.0*  --   --  20.8*  --  24.0*  --   --   PLT 254  --  278  --   --   --   --  241  --   --   --   --   APTT  --   --   --   --   --   --    < > 71* 56*  --   --  61*  HEPARINUNFRC  --   --   --   --   --   --   --  >2.20*  --   --   --   --   CREATININE  --    < > 3.20*  --   --  2.60*  --  3.42*  --   --  3.77*  --   TROPONINIHS  --   --   --  41*  --   --   --   --   --   --   --   --    < > = values in this interval not displayed.    Estimated Creatinine Clearance: 24.2 mL/min (A) (by C-G formula based on SCr of 3.77 mg/dL (H)).   Assessment: 60 yo M  admitted with AFL and CHF. Pt s/p failed DCCV 3/16. Pt on apixaban PTA, transitioned to heparin on 3/21 with concerns for EKG changes and likely need for ischemic evaluation and additional DCCV cardioversion.  APTT today remains slightly subtherapeutic. No signs of bleeding or complications per RN. HGb up to 8.2 (s/p PRBC x1).  Goal of Therapy:  Heparin level 0.3-0.7 units/ml aPTT 66-102 seconds Monitor platelets by anticoagulation protocol: Yes   Plan:  Increase heparin gtt to 1800 units/hr Check aPTT in 8h   Sherlon Handing, PharmD, BCPS Please see amion for complete clinical pharmacist phone list 01/28/2021 12:49 AM

## 2021-01-28 NOTE — Procedures (Signed)
Cortrak  Person Inserting Tube:  Mark [name], RD Tube Type:  Cortrak - 43 inches Tube Location:  Left nare Initial Placement:  Postpyloric Secured by: Bridle Technique Used to Measure Tube Placement:  Documented cm marking at nare/ corner of mouth Cortrak Secured At:  89 cm    Cortrak Tube Team Note:  Consult received to place a Cortrak feeding tube.  RN able to remove BiPAP for tube placement. Belly breathing noted before, during, and after placement X-ray is required, abdominal x-ray has been ordered by the Cortrak team. Please confirm tube placement before using the Cortrak tube. RN at bedside and CCM NP at bedside.   If the tube becomes dislodged please keep the tube and contact the Cortrak team at [email or web address] ([email or web address]) for replacement.  If after hours and replacement cannot be delayed, place a NG tube and confirm placement with an abdominal x-ray.    Lockie Pares., RD, LDN, CNSC See AMiON for contact information

## 2021-01-28 NOTE — Progress Notes (Signed)
[place] for IV heparin to Eliquis Indication: atrial fibrillation  Allergies  Allergen Reactions  . Penicillins Swelling, Rash and Other (See Comments)    Arms and eyes swell & skin breaks out  Has patient had a PCN reaction causing immediate rash, facial/tongue/throat swelling, SOB or lightheadedness with hypotension: Yes Has patient had a PCN reaction causing severe rash involving mucus membranes or skin necrosis: Yes Has patient had a PCN reaction that required hospitalization: No Has patient had a PCN reaction occurring within the last 10 years: No If all of the above answers are "NO", then may proceed with Cephalosporin use.   . Atorvastatin     Muscle pain in legs  . Rosuvastatin     Muscle pain in legs    Patient Measurements: Height: '5\' 9"'$  (175.3 cm) Weight: 105.7 kg (233 lb 0.4 oz) IBW/kg (Calculated) : 70.7 Heparin Dosing Weight: 95kg  Vital Signs: Temp: 97.4 F (36.3 C) (03/23 0410) Temp Source: Axillary (03/23 0410) BP: 105/76 (03/23 0500) Pulse Rate: 85 (03/23 0500)  Labs: Recent Labs    01/13/2021 0430 01/20/2021 0450 01/06/2021 0808 01/27/21 0200 01/27/21 1200 01/27/21 1432 01/27/21 1450 01/27/21 2330 01/28/21 0348 01/28/21 0420  HGB 7.1*  --    < > 6.5*  --  8.2*  --   --   --  7.1*  HCT 22.3*  --    < > 20.8*  --  24.0*  --   --   --  22.9*  PLT 278  --   --  241  --   --   --   --   --  220  APTT  --   --    < > 71* 56*  --   --  61*  --   --   HEPARINUNFRC  --   --   --  >2.20*  --   --   --   --  1.96*  --   CREATININE 3.20*  --    < > 3.42*  --   --  3.77*  --  4.24*  --   TROPONINIHS  --  41*  --   --   --   --   --   --   --   --    < > = values in this interval not displayed.    Estimated Creatinine Clearance: 21.9 mL/min (A) (by C-G formula based on SCr of 4.24 mg/dL (H)).   Assessment: 60 yo M admitted with AFL and CHF. Pt s/p failed DCCV 3/16. Pt on apixaban PTA, transitioned to heparin on 3/21  with concerns for EKG changes and likely need for ischemic evaluation and additional DCCV cardioversion.  Pharmacy consulted to transition IV heparin back to Eliquis. No signs of bleeding or complications noted. Hgb 7-8 (s/p PRBC x1), PLT wnl.  Goal of Therapy:  Monitor platelets by anticoagulation protocol: Yes   Plan:  D/c IV heparin Start Eliquis '5mg'$  PO twice daily Monitor CBC, s/sx bleeding

## 2021-01-28 NOTE — Progress Notes (Signed)
PT Cancellation Note  Patient Details Name: Mark [name] MRN: [id number] DOB: 1961/07/25   Cancelled Treatment:    Reason Eval/Treat Not Completed: Medical issues which prohibited therapy;Patient not medically ready.  Pt still significantly SOB, on BIPAP.  Will hold today and see when pt able. 01/28/2021  Ginger Carne., PT Acute Rehabilitation Services [phone]  (pager) [phone]  (office)   Tessie Fass [place] 01/28/2021, 3:21 PM

## 2021-01-28 NOTE — Plan of Care (Signed)

## 2021-01-28 NOTE — Progress Notes (Addendum)
NAME:  Mark [name], MRN:  [id number], DOB:  Jan 06, 1961, LOS: 7 ADMISSION DATE:  01/14/2021, CONSULTATION DATE: 3/19 REFERRING MD:  Dr. Erlinda Hong, CHIEF COMPLAINT:  TRH    History of Present Illness:  60 y/o M who presented to [facility] on 3/16 with shortness of breath, leg swelling and palpitations.   The patient has known CKD IV with baseline sr cr of 5, conservative management, L arm AVF in place in anticipation of HD.  He was brought in by Cardiology 3/16 for elective cardioversion.  On presentation, he was found to have significant volume overload needing admission per TRH.  After cardioversion he became transiently hypotensive and briefly required neosynephrine gtt.  He was admitted and Nephrology was consulted. He was diuresed with 1.5L UOP. Urinalysis grew 100k proteus, sensitivities pending. He was started on rocephin.  The patient remained on minimal O2. He had intermittent complaints of shoulder / left chest pain.  There were concerns for possible blood in stool, FOBT pending.   During his initial iHD on 3/19 evening, the patient began having chest pain and shortness of breath approximately 45 minutes into the therapy with HR into the 140's.  He was placed on a NRB and HD was stopped. He was transferred back to his room and placed on BiPAP. ABG showed an acute hypercarbic respiratory failure (7.154 / 71). CXR showed marked cardiomegaly with basilar atelectasis. RRT was called to assess the patient.  Cardiology was consulted, he was given 120 mg IV lasix and recommended to be started on an amiodarone infusion.  Troponin was assessed and 70, lactic acid 1.2. The patient was transferred to ICU for further observation.     Pertinent  Medical History  CKD IV - HD started this admit (01/27/2021) HTN HLD Chronic Diastolic CHF  Moderate AS Atrial Flutter - on Eliquis  Obesity  Anemia of Chronic Disease   Significant Hospital Events: Including procedures, antibiotic start and stop dates in addition to  other pertinent events    3/16 Admit after cardioversion, SOB, leg swelling / palpitations prior to presentation   3/19 PCCM consulted   3/20 CRRT started  3/21 Improved mental status, wore BiPAP overnight. Staph epidermis growing from [facility]. UC growing proteus, abx changed from ceftriaxone to cefepime.  Fever 100.2, -3.6L in last 24 hours.    3/21 CVVHD stopped.  Interim History / Subjective:  Afebrile/ WBC increased to 22.3 from 21 +1732m in 24 hours BiPAP, Amio and heparin gtt HGB improved from 6.5 to 7.4 after 1 unit PRBC  Objective   Blood pressure 106/77, pulse 81, temperature (!) 96.2 F (35.7 C), temperature source Axillary, resp. rate (!) 23, height '5\' 9"'$  (1.753 m), weight 105.7 kg, SpO2 100 %.    FiO2 (%):  [30 %-40 %] 40 % IPAP 20 EPAP 8  Intake/Output Summary (Last 24 hours) at 01/28/2021 0911 Last data filed at 01/28/2021 0700 Gross per 24 hour  Intake 1716.09 ml  Output --  Net 1716.09 ml   Filed Weights   02/05/2021 0500 01/27/21 0530 01/28/21 0456  Weight: 108.3 kg 101.9 kg 105.7 kg    Examination: General: In bed, large habitus, ill appearing, on BiPAP  HEENT: MM pink/moist, trachea midline, anicteric, BiPAP mask in place, RT IJ vascath C/D/I Neuro: RASS -1, GCS 14T, oriented, follows commands, eyes to voice, PERRL 349mCV: s1s2, irregularly irregular, AF flutter 70-80 rate controled, no r/g PULM: Non labored, symmetric chest expansion, lungs clear BL GI: soft, non tender, bsx4 active  Extremities: warm/dry, scant pretibial edema  VVS: Left AVF with + thrill/bruit Skin: suprapubic to inner thigh wound, circular, no drainage appreciated.   Lab/imaging reviewed  CBC, BMP  CXR- lungs appear more edematous compared to image on 3/21  Resolved Hospital Problem list     Assessment & Plan:   Acute Hypercarbic Respiratory Failure in setting of Acute Diastolic CHF -Continue BiPAP with plans to wean to Nasal canula  -Plan for HD today, hopeful that volume  removal will help with respiratory status -Avoid sedating medications which could compromise respiratory status -Continue to follow CXR tomorrow -Continue pulmonary hygiene with IS, DB, cough, and OOB as tolerated -Consider outpatient sleep study to determine presence of OSA/OHS post discharge  CKD V / New ESRD  HD initiated this admit, did not tolerate initial HD due to chest pain -Appreciate Nephrology assistance -HD volume removal -Continue to trend renal panel and UOP -Conservative electrolyte management if indicated  Acute Exacerbation of dCHF Atrial Flutter  Chronic Hypotension Acute on Chronic Diastolic HF S/P DCCV 0000000 with brief hypotension requiring neo, had some chest pain then. On eliquis previously for AFlutter.  -Appreciate cardiology input, will continue to follow recommendations  -Continue heparin GTT, will plan to transition back to eliquis today -Continue midodrine  -Continue amiodarone IV per cardiology while on bipap  Macrocytic Anemia  Reported red stool, hx of gastric ulcer. S/P PRBC 3/19, 3/22. Hgb 6.5 (3/22), transfused, HGB up to 8.3 post transfusion (3/22), now 7.1 (3/23) -Continue trending CBC -Follow up FOBT when sent due to hx of gastric ulcer. No other obvious signs of bleeding. May also be dilutional in the setting of volume overload. -Transfuse for HGB of <7 -Continue B12 supplimentation  BPH -Continue flomax  Proteus UTI Perineal Abscess, Right Thigh Wound  CNS Bacteremia ? Contaminant with 2/4 bottles Leukocytosis Remains Afebrile  -Appreciate CCS evaluation of groin wounds, continue topical clinda per recs. -Continue cefepime for UTI, appreciate pharmacy assistance -Continue vancomycin for bacteremia, appreciate pharmacy assistance  Left Shoulder Pain  -Will follow up exam once HD stable and off BiPAP   Moderate Protein Calorie Malnutrition Poor PO intake -Will trial off BiPAP today. If poor PO intake will place Cortrak and TF  orders.  Best practice (evaluated daily)  Diet:  Renal Diet with 1240m fluid restriction Pain/Anxiety/Delirium protocol (if indicated): No VAP protocol (if indicated): Not indicated DVT prophylaxis: Systemic AC GI prophylaxis: N/A Glucose control:  Basal insulin No Central venous access:  Yes, and it is still needed Arterial line:  N/A Foley:  N/A Mobility:  bed rest  PT consulted: N/A Last date of multidisciplinary goals of care discussion: REdd Arbour(brother) updated 3/23 Code Status:  full code Disposition: ICU    Critical care time:  3VintonGeorge, Jr., MSN, APRN, AGACNP-BC [place] [facility]  01/28/2021 , 9:12 AM   Please see [email or web address] for pager details  From 7a-7p if no response, please call [phone] After hours, please call Elink at [phone] Please see [email or web address] for pager details.   From 7A-7P if no response, please call [phone] After hours, please call ERichmond      PCCM Attending:   60yo M, admitted for AHRF, BIPAP, now ESRD.   Remains on BIPAP. [facility]. He has still had difficulty coming off BIPAP due to desaturations.   Chronic hypotension at baseline but better while on midodrine   BP 120/79  Pulse 78   Temp (!) 96.2 F (35.7 C) (Axillary)   Resp (!) 24   Ht '5\' 9"'$  (1.753 m)   Wt 105.7 kg   SpO2 100%   BMI 34.41 kg/m   Gen: obese, chronically ill, on BIPAP  HENT: right IJ catheter  Heart: RRR, s1 s2  Lungs: BL vented breaths  Abd: soft, nt nd   Labs reviewed Increasing WBC count   A:  AHRF on MV, worsening hypoxemia  Acute on Chronic Diastolic heart failure  ESRD, starting HD today  Anemia, chronic, no active bleeding, chronic ozing from groin wound Proteus UTI  Chronic hydradenitis  Staph bacteremia   P:  Continue vancomycin + cefepime  HD today  Probably cortrak  Can switch to eliquis  Remains on amio  Continue midodrine for  pressure support (this is chronic)   This patient is critically ill with multiple organ system failure; which, requires frequent high complexity decision making, assessment, support, evaluation, and titration of therapies. This was completed through the application of advanced monitoring technologies and extensive interpretation of multiple databases. During this encounter critical care time was devoted to patient care services described in this note for 34 minutes.  Garner Nash, DO [place] [facility] 01/28/2021 12:06 PM

## 2021-01-28 NOTE — Progress Notes (Signed)
OT Cancellation Note  Patient Details Name: Mark [name] MRN: [id number] DOB: Aug 22, 1961   Cancelled Treatment:    Reason Eval/Treat Not Completed: Patient not medically ready.  Spoke with RN, remains short of breath, ok to hold and continue efforts as appropriate.    Marygrace Sandoval D Cierah Crader 01/28/2021, 10:58 AM

## 2021-01-28 NOTE — Progress Notes (Addendum)
Initial Nutrition Assessment  DOCUMENTATION CODES:  Not applicable  INTERVENTION:  Initiate tube feeding via Cortrak: Vital 1.5 at 20 ml/h (1440 ml per day) Start at 20 ml/hr and increase 10 ml every 6 hrs to goal rate of 60 ml/hr Prosource TF 45 ml TID  Provides 2280 kcal, 132 gm protein, 1100 ml free water daily  Add Rena-Vite daily.  NUTRITION DIAGNOSIS:  Increased nutrient needs related to other (see comment) (new dx of ESRD) as evidenced by estimated needs.  GOAL:  Patient will meet greater than or equal to 90% of their needs  MONITOR:  TF tolerance,Labs,I & O's,Weight trends  REASON FOR ASSESSMENT:  Rounds    ASSESSMENT:  60 yo male with a PMH of gout, osteoporosis, HTN, anemia, arthritis, and CKD stage 4 who presents with fluid overload. 3/19 - CRRT started 3/21 - transition to heparin with concerns for EKG changes and likely need for ischemic evaluation and additional DCCV cardioversion 3/22 - CRRT stopped Transitioned to HD today.  Pt will be on HD, but his blood pressures are soft, so may transition back to CRRT, per Nephrology note.  Spoke with brother and wife from [place], [place]. They visited 3-4 weeks ago, and saw him eating and preparing his own meals and they did not notice any weight loss.   Spoke to nurse, and he can eat when he is off the BiPAP, but is not meeting needs. Per Epic, eating 0-90%, mostly 0%, of meals.  Recommend Cortrak placement and beginning TF. Spoke with MD and with BiPap, pt will not be able to meet his needs. MD okay'ed Cortrak placement.  Relevant Medications: ferric citrate 630 mg TID, midodrine TID, Protonix, Vitamin B12 1000 mcg, heparin, vancomycin Labs: reviewed; Na 132, CBG 112-129, Phos 7, Mag 2.5 HbA1c: 6.1% (11/2010)  NUTRITION - FOCUSED PHYSICAL EXAM: Flowsheet Row Most Recent Value  Orbital Region No depletion  Upper Arm Region Mild depletion  Thoracic and Lumbar Region No depletion  Buccal Region No depletion   Temple Region Mild depletion  Clavicle Bone Region Moderate depletion  Clavicle and Acromion Bone Region Moderate depletion  Scapular Bone Region Unable to assess  Dorsal Hand No depletion  Patellar Region Unable to assess  Anterior Thigh Region Unable to assess  Posterior Calf Region Unable to assess  Edema (RD Assessment) Moderate  [bilateral lower extremites]  Hair Reviewed  Eyes Unable to assess  Mouth Unable to assess  Skin Reviewed  Nails Reviewed     Diet Order:   Diet Order            Diet renal with fluid restriction Fluid restriction: 1200 mL Fluid; Room service appropriate? Yes; Fluid consistency: Thin  Diet effective now                EDUCATION NEEDS:  Not appropriate for education at this time  Skin:  Skin Assessment: Skin Integrity Issues: (Weeping blister on R thigh) Skin Integrity Issues:: Incisions Incisions: Groin  Last BM:  01/23/21  Height:  Ht Readings from Last 1 Encounters:  01/14/2021 '5\' 9"'$  (1.753 m)   Weight:  Wt Readings from Last 1 Encounters:  01/28/21 106 kg   Ideal Body Weight:  72.7 kg  BMI:  Body mass index is 34.51 kg/m.  Estimated Nutritional Needs:  Kcal:  2200-2400 Protein:  125-145 grams Fluid:  1000 ml + UOP (liberalize with CRRT)  Derrel Nip, RD, LDN Registered Dietitian After Hours/Weekend Pager # in [phone]

## 2021-01-29 ENCOUNTER — Inpatient Hospital Stay (HOSPITAL_COMMUNITY): Payer: 59 | Admitting: Certified Registered"

## 2021-01-29 ENCOUNTER — Encounter (HOSPITAL_COMMUNITY): Admission: AD | Disposition: E | Payer: Self-pay | Source: Home / Self Care | Attending: Pulmonary Disease

## 2021-01-29 ENCOUNTER — Inpatient Hospital Stay (HOSPITAL_COMMUNITY): Payer: 59

## 2021-01-29 ENCOUNTER — Encounter (HOSPITAL_COMMUNITY): Payer: Self-pay | Admitting: Internal Medicine

## 2021-01-29 DIAGNOSIS — I313 Pericardial effusion (noninflammatory): Secondary | ICD-10-CM

## 2021-01-29 DIAGNOSIS — Z452 Encounter for adjustment and management of vascular access device: Secondary | ICD-10-CM

## 2021-01-29 DIAGNOSIS — I4892 Unspecified atrial flutter: Secondary | ICD-10-CM | POA: Diagnosis not present

## 2021-01-29 DIAGNOSIS — R0603 Acute respiratory distress: Secondary | ICD-10-CM | POA: Diagnosis not present

## 2021-01-29 DIAGNOSIS — E1122 Type 2 diabetes mellitus with diabetic chronic kidney disease: Secondary | ICD-10-CM

## 2021-01-29 DIAGNOSIS — Z95828 Presence of other vascular implants and grafts: Secondary | ICD-10-CM

## 2021-01-29 DIAGNOSIS — I5033 Acute on chronic diastolic (congestive) heart failure: Secondary | ICD-10-CM | POA: Diagnosis not present

## 2021-01-29 DIAGNOSIS — I959 Hypotension, unspecified: Secondary | ICD-10-CM

## 2021-01-29 DIAGNOSIS — I484 Atypical atrial flutter: Secondary | ICD-10-CM | POA: Diagnosis not present

## 2021-01-29 DIAGNOSIS — E8779 Other fluid overload: Secondary | ICD-10-CM | POA: Diagnosis not present

## 2021-01-29 DIAGNOSIS — I469 Cardiac arrest, cause unspecified: Secondary | ICD-10-CM

## 2021-01-29 DIAGNOSIS — J9602 Acute respiratory failure with hypercapnia: Secondary | ICD-10-CM | POA: Diagnosis not present

## 2021-01-29 LAB — CBC
HCT: 26.5 % — ABNORMAL LOW (ref 39.0–52.0)
HCT: 23.2 % — ABNORMAL LOW (ref 39.0–52.0)
HCT: 22 % — ABNORMAL LOW (ref 39.0–52.0)
Hemoglobin: 8.5 g/dL — ABNORMAL LOW (ref 13.0–17.0)
Hemoglobin: 7.5 g/dL — ABNORMAL LOW (ref 13.0–17.0)
Hemoglobin: 7.3 g/dL — ABNORMAL LOW (ref 13.0–17.0)
MCH: 31.8 pg (ref 26.0–34.0)
MCH: 31.5 pg (ref 26.0–34.0)
MCH: 32.3 pg (ref 26.0–34.0)
MCHC: 32.1 g/dL (ref 30.0–36.0)
MCHC: 32.3 g/dL (ref 30.0–36.0)
MCHC: 33.2 g/dL (ref 30.0–36.0)
MCV: 99.3 fL (ref 80.0–100.0)
MCV: 97.5 fL (ref 80.0–100.0)
MCV: 97.3 fL (ref 80.0–100.0)
Platelets: 239 10*3/uL (ref 150–400)
Platelets: 240 10*3/uL (ref 150–400)
Platelets: 235 10*3/uL (ref 150–400)
RBC: 2.67 MIL/uL — ABNORMAL LOW (ref 4.22–5.81)
RBC: 2.38 MIL/uL — ABNORMAL LOW (ref 4.22–5.81)
RBC: 2.26 MIL/uL — ABNORMAL LOW (ref 4.22–5.81)
RDW: 17.6 % — ABNORMAL HIGH (ref 11.5–15.5)
RDW: 17.1 % — ABNORMAL HIGH (ref 11.5–15.5)
RDW: 17.4 % — ABNORMAL HIGH (ref 11.5–15.5)
WBC: 31 10*3/uL — ABNORMAL HIGH (ref 4.0–10.5)
WBC: 25.2 10*3/uL — ABNORMAL HIGH (ref 4.0–10.5)
WBC: 22.1 10*3/uL — ABNORMAL HIGH (ref 4.0–10.5)
nRBC: 1.7 % — ABNORMAL HIGH (ref 0.0–0.2)
nRBC: 1.4 % — ABNORMAL HIGH (ref 0.0–0.2)
nRBC: 2.4 % — ABNORMAL HIGH (ref 0.0–0.2)

## 2021-01-29 LAB — RENAL FUNCTION PANEL
Albumin: 2.2 g/dL — ABNORMAL LOW (ref 3.5–5.0)
Albumin: 2.1 g/dL — ABNORMAL LOW (ref 3.5–5.0)
Anion gap: 10 (ref 5–15)
Anion gap: 12 (ref 5–15)
BUN: 65 mg/dL — ABNORMAL HIGH (ref 8–23)
BUN: 75 mg/dL — ABNORMAL HIGH (ref 8–23)
CO2: 26 mmol/L (ref 22–32)
CO2: 25 mmol/L (ref 22–32)
Calcium: 11.8 mg/dL — ABNORMAL HIGH (ref 8.9–10.3)
Calcium: 12 mg/dL — ABNORMAL HIGH (ref 8.9–10.3)
Chloride: 99 mmol/L (ref 98–111)
Chloride: 98 mmol/L (ref 98–111)
Creatinine, Ser: 3.51 mg/dL — ABNORMAL HIGH (ref 0.61–1.24)
Creatinine, Ser: 3.88 mg/dL — ABNORMAL HIGH (ref 0.61–1.24)
GFR, Estimated: 19 mL/min — ABNORMAL LOW (ref >60)
GFR, Estimated: 17 mL/min — ABNORMAL LOW (ref >60)
Glucose, Bld: 154 mg/dL — ABNORMAL HIGH (ref 70–99)
Glucose, Bld: 146 mg/dL — ABNORMAL HIGH (ref 70–99)
Phosphorus: 6.4 mg/dL — ABNORMAL HIGH (ref 2.5–4.6)
Phosphorus: 5.7 mg/dL — ABNORMAL HIGH (ref 2.5–4.6)
Potassium: 4.3 mmol/L (ref 3.5–5.1)
Potassium: 4.2 mmol/L (ref 3.5–5.1)
Sodium: 135 mmol/L (ref 135–145)
Sodium: 135 mmol/L (ref 135–145)

## 2021-01-29 LAB — POCT I-STAT 7, (LYTES, BLD GAS, ICA,H+H)
Acid-Base Excess: 3 mmol/L — ABNORMAL HIGH (ref 0.0–2.0)
Bicarbonate: 28.3 mmol/L — ABNORMAL HIGH (ref 20.0–28.0)
Calcium, Ion: 1.56 mmol/L (ref 1.15–1.40)
HCT: 25 % — ABNORMAL LOW (ref 39.0–52.0)
Hemoglobin: 8.5 g/dL — ABNORMAL LOW (ref 13.0–17.0)
O2 Saturation: 100 %
Patient temperature: 98
Potassium: 4.1 mmol/L (ref 3.5–5.1)
Sodium: 134 mmol/L — ABNORMAL LOW (ref 135–145)
TCO2: 30 mmol/L (ref 22–32)
pCO2 arterial: 48.5 mmHg — ABNORMAL HIGH (ref 32.0–48.0)
pH, Arterial: 7.373 (ref 7.350–7.450)
pO2, Arterial: 222 mmHg — ABNORMAL HIGH (ref 83.0–108.0)

## 2021-01-29 LAB — ECHOCARDIOGRAM COMPLETE
AR max vel: 0.87 cm2
AV Area VTI: 0.99 cm2
AV Area mean vel: 0.95 cm2
AV Mean grad: 28 mmHg
AV Peak grad: 44.6 mmHg
Ao pk vel: 3.34 m/s
Area-P 1/2: 3.12 cm2
Height: 69 in
P 1/2 time: 333 msec
S' Lateral: 3.4 cm
Weight: 3608.49 oz

## 2021-01-29 LAB — GLUCOSE, CAPILLARY
Glucose-Capillary: 141 mg/dL — ABNORMAL HIGH (ref 70–99)
Glucose-Capillary: 140 mg/dL — ABNORMAL HIGH (ref 70–99)
Glucose-Capillary: 142 mg/dL — ABNORMAL HIGH (ref 70–99)
Glucose-Capillary: 161 mg/dL — ABNORMAL HIGH (ref 70–99)
Glucose-Capillary: 115 mg/dL — ABNORMAL HIGH (ref 70–99)

## 2021-01-29 LAB — COMPREHENSIVE METABOLIC PANEL
ALT: 17 U/L (ref 0–44)
AST: 35 U/L (ref 15–41)
Albumin: 2.4 g/dL — ABNORMAL LOW (ref 3.5–5.0)
Alkaline Phosphatase: 85 U/L (ref 38–126)
Anion gap: 13 (ref 5–15)
BUN: 62 mg/dL — ABNORMAL HIGH (ref 8–23)
CO2: 27 mmol/L (ref 22–32)
Calcium: 11.7 mg/dL — ABNORMAL HIGH (ref 8.9–10.3)
Chloride: 97 mmol/L — ABNORMAL LOW (ref 98–111)
Creatinine, Ser: 3.44 mg/dL — ABNORMAL HIGH (ref 0.61–1.24)
GFR, Estimated: 19 mL/min — ABNORMAL LOW (ref >60)
Glucose, Bld: 182 mg/dL — ABNORMAL HIGH (ref 70–99)
Potassium: 4.2 mmol/L (ref 3.5–5.1)
Sodium: 137 mmol/L (ref 135–145)
Total Bilirubin: 0.9 mg/dL (ref 0.3–1.2)
Total Protein: 7.8 g/dL (ref 6.5–8.1)

## 2021-01-29 LAB — BASIC METABOLIC PANEL
Anion gap: 10 (ref 5–15)
BUN: 79 mg/dL — ABNORMAL HIGH (ref 8–23)
CO2: 25 mmol/L (ref 22–32)
Calcium: 11.9 mg/dL — ABNORMAL HIGH (ref 8.9–10.3)
Chloride: 99 mmol/L (ref 98–111)
Creatinine, Ser: 3.84 mg/dL — ABNORMAL HIGH (ref 0.61–1.24)
GFR, Estimated: 17 mL/min — ABNORMAL LOW (ref >60)
Glucose, Bld: 129 mg/dL — ABNORMAL HIGH (ref 70–99)
Potassium: 4.1 mmol/L (ref 3.5–5.1)
Sodium: 134 mmol/L — ABNORMAL LOW (ref 135–145)

## 2021-01-29 LAB — MAGNESIUM
Magnesium: 2.3 mg/dL (ref 1.7–2.4)
Magnesium: 2.3 mg/dL (ref 1.7–2.4)

## 2021-01-29 LAB — PHOSPHORUS: Phosphorus: 6.5 mg/dL — ABNORMAL HIGH (ref 2.5–4.6)

## 2021-01-29 LAB — COOXEMETRY PANEL
Carboxyhemoglobin: 1.3 % (ref 0.5–1.5)
Methemoglobin: 1 % (ref 0.0–1.5)
O2 Saturation: 75.9 %
Total hemoglobin: 6.5 g/dL — CL (ref 12.0–16.0)

## 2021-01-29 LAB — TROPONIN I (HIGH SENSITIVITY)
Troponin I (High Sensitivity): 87 ng/L — ABNORMAL HIGH (ref <18)
Troponin I (High Sensitivity): 111 ng/L (ref <18)

## 2021-01-29 LAB — OCCULT BLOOD X 1 CARD TO LAB, STOOL: Fecal Occult Bld: POSITIVE — AB

## 2021-01-29 SURGERY — CREATION, PERICARDIAL WINDOW, ANTERIOR THORACOTOMY APPROACH
Anesthesia: General | Laterality: Left

## 2021-01-29 SURGERY — PERICARDIOCENTESIS
Anesthesia: LOCAL

## 2021-01-29 MED ORDER — POLYETHYLENE GLYCOL 3350 17 G PO PACK
17.0000 g | PACK | Freq: Two times a day (BID) | ORAL | Status: DC | PRN
  Administered 2021-02-03: 17 g
  Filled 2021-01-29: qty 1

## 2021-01-29 MED ORDER — FENTANYL 2500MCG IN NS 250ML (10MCG/ML) PREMIX INFUSION
50.0000 ug/h | INTRAVENOUS | Status: DC
  Administered 2021-01-29: 50 ug/h via INTRAVENOUS
  Administered 2021-01-30: 75 ug/h via INTRAVENOUS
  Administered 2021-01-31: 100 ug/h via INTRAVENOUS
  Administered 2021-02-01 – 2021-02-03 (×3): 125 ug/h via INTRAVENOUS
  Administered 2021-02-04: 150 ug/h via INTRAVENOUS
  Filled 2021-01-29 (×7): qty 250

## 2021-01-29 MED ORDER — FENTANYL CITRATE (PF) 100 MCG/2ML IJ SOLN
75.0000 ug | Freq: Once | INTRAMUSCULAR | Status: AC
Start: 2021-01-29 — End: 2021-01-29

## 2021-01-29 MED ORDER — FENTANYL CITRATE (PF) 100 MCG/2ML IJ SOLN
INTRAMUSCULAR | Status: AC
  Filled 2021-01-29: qty 2

## 2021-01-29 MED ORDER — FENTANYL CITRATE (PF) 250 MCG/5ML IJ SOLN
INTRAMUSCULAR | Status: AC
  Filled 2021-01-29: qty 5

## 2021-01-29 MED ORDER — B COMPLEX-C PO TABS
1.0000 | ORAL_TABLET | Freq: Every day | ORAL | Status: DC
  Administered 2021-01-29 – 2021-02-02 (×5): 1 via ORAL
  Filled 2021-01-29 (×5): qty 1

## 2021-01-29 MED ORDER — VANCOMYCIN VARIABLE DOSE PER UNSTABLE RENAL FUNCTION (PHARMACIST DOSING): Status: DC

## 2021-01-29 MED ORDER — ROCURONIUM BROMIDE 10 MG/ML (PF) SYRINGE
PREFILLED_SYRINGE | INTRAVENOUS | Status: DC | PRN
  Administered 2021-01-29 (×3): 50 mg via INTRAVENOUS

## 2021-01-29 MED ORDER — CHLORHEXIDINE GLUCONATE 0.12% ORAL RINSE (MEDLINE KIT)
15.0000 mL | Freq: Two times a day (BID) | OROMUCOSAL | Status: DC
  Administered 2021-01-29 – 2021-02-05 (×15): 15 mL via OROMUCOSAL

## 2021-01-29 MED ORDER — FENTANYL CITRATE (PF) 100 MCG/2ML IJ SOLN: 25.0000 ug | Freq: Once | INTRAMUSCULAR | Status: AC

## 2021-01-29 MED ORDER — AMIODARONE HCL 200 MG PO TABS: 200.0000 mg | ORAL_TABLET | Freq: Two times a day (BID) | ORAL | Status: DC

## 2021-01-29 MED ORDER — ETOMIDATE 2 MG/ML IV SOLN
INTRAVENOUS | Status: AC
  Filled 2021-01-29: qty 20

## 2021-01-29 MED ORDER — FENTANYL 2500MCG IN NS 250ML (10MCG/ML) PREMIX INFUSION: 25.0000 ug/h | INTRAVENOUS | Status: DC

## 2021-01-29 MED ORDER — PHENYLEPHRINE HCL-NACL 10-0.9 MG/250ML-% IV SOLN
INTRAVENOUS | Status: AC
  Filled 2021-01-29: qty 250

## 2021-01-29 MED ORDER — LACTATED RINGERS IV BOLUS
1000.0000 mL | Freq: Once | INTRAVENOUS | Status: AC
  Administered 2021-01-29: 1000 mL via INTRAVENOUS

## 2021-01-29 MED ORDER — NOREPINEPHRINE 16 MG/250ML-% IV SOLN
0.0000 ug/min | INTRAVENOUS | Status: DC
  Administered 2021-01-30: 20 ug/min via INTRAVENOUS
  Administered 2021-01-30: 18 ug/min via INTRAVENOUS
  Administered 2021-02-01: 9 ug/min via INTRAVENOUS
  Administered 2021-02-02: 22 ug/min via INTRAVENOUS
  Administered 2021-02-02: 30 ug/min via INTRAVENOUS
  Administered 2021-02-02: 18 ug/min via INTRAVENOUS
  Administered 2021-02-03: 8 ug/min via INTRAVENOUS
  Administered 2021-02-05: 9 ug/min via INTRAVENOUS
  Administered 2021-02-05: 12 ug/min via INTRAVENOUS
  Administered 2021-02-06: 90 ug/min via INTRAVENOUS
  Administered 2021-02-06: 41 ug/min via INTRAVENOUS
  Filled 2021-01-29 (×6): qty 250
  Filled 2021-01-29: qty 500
  Filled 2021-01-29 (×3): qty 250

## 2021-01-29 MED ORDER — TRAMADOL HCL 50 MG PO TABS: 50.0000 mg | ORAL_TABLET | Freq: Four times a day (QID) | ORAL | Status: DC | PRN

## 2021-01-29 MED ORDER — POLYETHYLENE GLYCOL 3350 17 G PO PACK
17.0000 g | PACK | Freq: Every day | ORAL | Status: DC
  Administered 2021-01-29 – 2021-02-05 (×8): 17 g
  Filled 2021-01-29 (×8): qty 1

## 2021-01-29 MED ORDER — PROPOFOL 1000 MG/100ML IV EMUL
0.0000 ug/kg/min | INTRAVENOUS | Status: DC
  Administered 2021-01-29: 25 ug/kg/min via INTRAVENOUS
  Administered 2021-01-30: 10 ug/kg/min via INTRAVENOUS
  Administered 2021-01-31: 20 ug/kg/min via INTRAVENOUS
  Filled 2021-01-29 (×3): qty 100

## 2021-01-29 MED ORDER — ROCURONIUM BROMIDE 10 MG/ML (PF) SYRINGE
PREFILLED_SYRINGE | INTRAVENOUS | Status: AC
  Filled 2021-01-29: qty 10

## 2021-01-29 MED ORDER — ORAL CARE MOUTH RINSE
15.0000 mL | OROMUCOSAL | Status: DC
  Administered 2021-01-29 – 2021-02-06 (×74): 15 mL via OROMUCOSAL

## 2021-01-29 MED ORDER — PHENYLEPHRINE 40 MCG/ML (10ML) SYRINGE FOR IV PUSH (FOR BLOOD PRESSURE SUPPORT)
PREFILLED_SYRINGE | INTRAVENOUS | Status: AC
  Administered 2021-01-29: 100 ug
  Filled 2021-01-29: qty 10

## 2021-01-29 MED ORDER — FENTANYL CITRATE (PF) 100 MCG/2ML IJ SOLN
75.0000 ug | Freq: Once | INTRAMUSCULAR | Status: AC
  Administered 2021-01-29: 75 ug via INTRAVENOUS

## 2021-01-29 MED ORDER — NOREPINEPHRINE 16 MG/250ML-% IV SOLN
INTRAVENOUS | Status: AC
  Administered 2021-01-29: 10 ug/min via INTRAVENOUS
  Filled 2021-01-29: qty 250

## 2021-01-29 MED ORDER — 0.9 % SODIUM CHLORIDE (POUR BTL) OPTIME
TOPICAL | Status: DC | PRN
  Administered 2021-01-29: 1000 mL

## 2021-01-29 MED ORDER — NOREPINEPHRINE 4 MG/250ML-% IV SOLN: 0.0000 ug/min | INTRAVENOUS | Status: DC

## 2021-01-29 MED ORDER — MIDAZOLAM HCL 2 MG/2ML IJ SOLN
INTRAMUSCULAR | Status: DC | PRN
  Administered 2021-01-29: 2 mg via INTRAVENOUS

## 2021-01-29 MED ORDER — BUPIVACAINE LIPOSOME 1.3 % IJ SUSP
INTRAMUSCULAR | Status: DC | PRN
  Administered 2021-01-29: 20 mL

## 2021-01-29 MED ORDER — FENTANYL BOLUS VIA INFUSION
50.0000 ug | INTRAVENOUS | Status: DC | PRN
  Filled 2021-01-29: qty 100

## 2021-01-29 MED ORDER — PROSOURCE TF PO LIQD
45.0000 mL | Freq: Four times a day (QID) | ORAL | Status: DC
  Administered 2021-01-29 – 2021-02-05 (×29): 45 mL
  Filled 2021-01-29 (×29): qty 45

## 2021-01-29 MED ORDER — FENTANYL CITRATE (PF) 100 MCG/2ML IJ SOLN
INTRAMUSCULAR | Status: AC
  Administered 2021-01-29: 25 ug via INTRAVENOUS
  Filled 2021-01-29: qty 2

## 2021-01-29 MED ORDER — FENTANYL BOLUS VIA INFUSION: 25.0000 ug | INTRAVENOUS | Status: DC | PRN

## 2021-01-29 MED ORDER — FENTANYL BOLUS VIA INFUSION
25.0000 ug | INTRAVENOUS | Status: DC | PRN
  Administered 2021-01-30: 25 ug via INTRAVENOUS
  Filled 2021-01-29: qty 25

## 2021-01-29 MED ORDER — FENTANYL CITRATE (PF) 100 MCG/2ML IJ SOLN
25.0000 ug | Freq: Once | INTRAMUSCULAR | Status: AC
Start: 2021-01-29 — End: 2021-01-29
  Administered 2021-01-29: 25 ug via INTRAVENOUS

## 2021-01-29 MED ORDER — NOREPINEPHRINE 4 MG/250ML-% IV SOLN
INTRAVENOUS | Status: AC
  Filled 2021-01-29: qty 250

## 2021-01-29 MED ORDER — KETAMINE HCL 50 MG/5ML IJ SOSY
PREFILLED_SYRINGE | INTRAMUSCULAR | Status: AC
  Filled 2021-01-29: qty 5

## 2021-01-29 MED ORDER — MIDAZOLAM HCL 2 MG/2ML IJ SOLN
INTRAMUSCULAR | Status: AC
  Filled 2021-01-29: qty 2

## 2021-01-29 MED ORDER — AMIODARONE HCL 200 MG PO TABS
400.0000 mg | ORAL_TABLET | Freq: Two times a day (BID) | ORAL | Status: DC
  Administered 2021-01-29: 400 mg via ORAL
  Filled 2021-01-29: qty 2

## 2021-01-29 MED ORDER — LACTATED RINGERS IV SOLN: INTRAVENOUS | Status: DC | PRN

## 2021-01-29 MED ORDER — BUPIVACAINE LIPOSOME 1.3 % IJ SUSP
INTRAMUSCULAR | Status: AC
  Filled 2021-01-29: qty 20

## 2021-01-29 MED ORDER — FENTANYL CITRATE (PF) 100 MCG/2ML IJ SOLN
INTRAMUSCULAR | Status: AC
  Administered 2021-01-29: 75 ug via INTRAVENOUS
  Filled 2021-01-29: qty 2

## 2021-01-29 MED ORDER — DOCUSATE SODIUM 50 MG/5ML PO LIQD
100.0000 mg | Freq: Two times a day (BID) | ORAL | Status: DC
  Administered 2021-01-29 – 2021-02-05 (×16): 100 mg
  Filled 2021-01-29 (×16): qty 10

## 2021-01-29 MED ORDER — VITAL 1.5 CAL PO LIQD
1000.0000 mL | ORAL | Status: DC
  Administered 2021-01-29 – 2021-02-05 (×7): 1000 mL

## 2021-01-29 MED ORDER — AMIODARONE HCL 200 MG PO TABS
400.0000 mg | ORAL_TABLET | Freq: Two times a day (BID) | ORAL | Status: DC
  Administered 2021-01-29: 400 mg
  Filled 2021-01-29: qty 2

## 2021-01-29 MED ORDER — PROPOFOL 1000 MG/100ML IV EMUL
INTRAVENOUS | Status: AC
  Administered 2021-01-29: 15 ug/kg/min via INTRAVENOUS
  Filled 2021-01-29: qty 100

## 2021-01-29 MED ORDER — FENTANYL CITRATE (PF) 100 MCG/2ML IJ SOLN
50.0000 ug | Freq: Once | INTRAMUSCULAR | Status: AC
  Administered 2021-01-29: 50 ug via INTRAVENOUS
  Filled 2021-01-29: qty 2

## 2021-01-29 SURGICAL SUPPLY — 48 items
BENZOIN TINCTURE PRP APPL 2/3 (GAUZE/BANDAGES/DRESSINGS) ×2 IMPLANT
BLADE CLIPPER SURG (BLADE) ×2 IMPLANT
CANISTER SUCT 3000ML PPV (MISCELLANEOUS) ×2 IMPLANT
CNTNR URN SCR LID CUP LEK RST (MISCELLANEOUS) IMPLANT
CONN ST 1/4X3/8  BEN (MISCELLANEOUS) ×1
CONN ST 1/4X3/8 BEN (MISCELLANEOUS) ×1 IMPLANT
CONT SPEC 4OZ STRL OR WHT (MISCELLANEOUS)
COVER SURGICAL LIGHT HANDLE (MISCELLANEOUS) ×4 IMPLANT
DERMABOND ADVANCED (GAUZE/BANDAGES/DRESSINGS) ×1
DERMABOND ADVANCED .7 DNX12 (GAUZE/BANDAGES/DRESSINGS) ×1 IMPLANT
DRAIN CHANNEL 28F RND 3/8 FF (WOUND CARE) ×2 IMPLANT
DRAPE CHEST BREAST 15X10 FENES (DRAPES) ×2 IMPLANT
DRAPE HALF SHEET 40X57 (DRAPES) ×2 IMPLANT
DRAPE LAPAROSCOPIC ABDOMINAL (DRAPES) ×2 IMPLANT
DRSG AQUACEL AG ADV 3.5X 6 (GAUZE/BANDAGES/DRESSINGS) ×2 IMPLANT
ELECT BLADE 4.0 EZ CLEAN MEGAD (MISCELLANEOUS) ×2
ELECT REM PT RETURN 9FT ADLT (ELECTROSURGICAL) ×2
ELECTRODE BLDE 4.0 EZ CLN MEGD (MISCELLANEOUS) ×1 IMPLANT
ELECTRODE REM PT RTRN 9FT ADLT (ELECTROSURGICAL) ×1 IMPLANT
GAUZE SPONGE 4X4 12PLY STRL (GAUZE/BANDAGES/DRESSINGS) IMPLANT
GLOVE NEODERM STRL 7.5 LF PF (GLOVE) ×2 IMPLANT
GLOVE SURG NEODERM 7.5  LF PF (GLOVE) ×2
KIT BASIN OR (CUSTOM PROCEDURE TRAY) ×2 IMPLANT
KIT TURNOVER KIT B (KITS) ×2 IMPLANT
NEEDLE 22X1 1/2 (OR ONLY) (NEEDLE) ×2 IMPLANT
NS IRRIG 1000ML POUR BTL (IV SOLUTION) ×2 IMPLANT
PACK CHEST (CUSTOM PROCEDURE TRAY) ×2 IMPLANT
PAD ARMBOARD 7.5X6 YLW CONV (MISCELLANEOUS) ×4 IMPLANT
PAD ELECT DEFIB RADIOL ZOLL (MISCELLANEOUS) ×2 IMPLANT
STRIP CLOSURE SKIN 1/2X4 (GAUZE/BANDAGES/DRESSINGS) ×2 IMPLANT
SUT MNCRL AB 4-0 PS2 18 (SUTURE) ×2 IMPLANT
SUT PDS AB 1 CTX 36 (SUTURE) ×2 IMPLANT
SUT PDS AB 2-0 CT1 27 (SUTURE) IMPLANT
SUT SILK  1 MH (SUTURE) ×1
SUT SILK 1 MH (SUTURE) ×1 IMPLANT
SUT SILK 2 0 SH CR/8 (SUTURE) ×2 IMPLANT
SUT VIC AB 0 CT1 27 (SUTURE) ×1
SUT VIC AB 0 CT1 27XBRD ANBCTR (SUTURE) ×1 IMPLANT
SUT VIC AB 2-0 CT1 36 (SUTURE) IMPLANT
SWAB COLLECTION DEVICE MRSA (MISCELLANEOUS) IMPLANT
SWAB CULTURE ESWAB REG 1ML (MISCELLANEOUS) IMPLANT
SYR 10ML LL (SYRINGE) ×2 IMPLANT
SYSTEM SAHARA CHEST DRAIN ATS (WOUND CARE) IMPLANT
TOWEL GREEN STERILE (TOWEL DISPOSABLE) ×2 IMPLANT
TOWEL GREEN STERILE FF (TOWEL DISPOSABLE) ×2 IMPLANT
TRAP SPECIMEN MUCUS 40CC (MISCELLANEOUS) IMPLANT
TRAY FOLEY SLVR 16FR TEMP STAT (SET/KITS/TRAYS/PACK) ×2 IMPLANT
WATER STERILE IRR 1000ML POUR (IV SOLUTION) ×4 IMPLANT

## 2021-01-29 NOTE — Progress Notes (Signed)
eLink Physician-Brief Progress Note Patient Name: Mark [name] DOB: 11-12-1960 MRN: [id number]   Date of Service  01/13/2021  HPI/Events of Note  Code blue called, I arrived in the room with CPR in progress and helped in initial code management, PCCM ground crew arrived and took over.   eICU Interventions  See above.        [name] 01/11/2021, 1:39 AM

## 2021-01-29 NOTE — Plan of Care (Signed)
  Problem: Education: Goal: Knowledge of General Education information will improve Description: Including pain rating scale, medication(s)/side effects and non-pharmacologic comfort measures Outcome: Progressing   Problem: Health Behavior/Discharge Planning: Goal: Ability to manage health-related needs will improve Outcome: Progressing   Problem: Clinical Measurements: Goal: Will remain free from infection Outcome: Progressing   Problem: Clinical Measurements: Goal: Cardiovascular complication will be avoided Outcome: Progressing   Problem: Coping: Goal: Level of anxiety will decrease Outcome: Progressing   Problem: Safety: Goal: Non-violent Restraint(s) Outcome: Progressing

## 2021-01-29 NOTE — Procedures (Signed)
Arterial Catheter Insertion Procedure Note  Mark [name]  [id number]  November 18, 1960  Date:02/04/2021  Time:3:53 AM    Provider Performing: [name]    Procedure: Insertion of Arterial Line [phone]) with US guidance [id number])   Indication(s) Blood pressure monitoring and/or need for frequent ABGs  Consent Unable to obtain consent due to emergent nature of procedure.  Anesthesia None  Time Out Verified patient identification, verified procedure, site/side was marked, verified correct patient position, special equipment/implants available, medications/allergies/relevant history reviewed, required imaging and test results available.  Sterile Technique Maximal sterile technique including full sterile barrier drape, hand hygiene, sterile gown, sterile gloves, mask, hair covering, sterile ultrasound probe cover (if used).  Procedure Description Area of catheter insertion was cleaned with chlorhexidine and draped in sterile fashion. With real-time ultrasound guidance an arterial catheter was placed into the left femoral artery.  Appropriate arterial tracings confirmed on monitor.     Complications/Tolerance None; patient tolerated the procedure well.   EBL Minimal  Specimen(s) None  The entire procedure was supervised/proctored by Mark Baas Gleason, PA-C.  [name], PA-C [facility] 01/19/2021 3:54 AM  Please see [email or web address] for pager details.

## 2021-01-29 NOTE — Significant Event (Addendum)
Significant event:  Contacted by Dr. Meda Coffee that echo is suggestive of tamponade. Patient is currently intubated. Attempted to contact patient's brother Mark [name] for consent x2.  Of note, he received apixaban last night and this AM. Will discuss elevated risk of bleeding, but given cardiac arrest overnight need to consider risk both of pericardiocentesis but also risk of recurrent cardiac arrest.  ADDENDED TO ADD: Discussed with Dr. Claiborne Billings, who also discussed with Dr. Irish Lack. Concern is that he is anticoagulated and effusion is primarily posterior. Do not think it is safe to do in the cath lab.  I contacted Dr. Orvan Seen, on call CT surgeon. We discussed the patient, and he will take the patient to the OR for a pericardial window.  I discussed over the phone with Mark [name]. We discussed the risk of surgery, primarily life threatening bleeding in addition to typical risks of surgery, as well as the risk of not performing an intervention. He wishes to proceed with pericardial window. Phone consent was verified by Shelbie Hutching, RN, patient's ICU nurse.  01/20/2021 5:29 PM  Buford Dresser, MD, PhD, [place]  [address] [place], [place] [address] [phone]

## 2021-01-29 NOTE — Progress Notes (Addendum)
NAME:  Mark [name], MRN:  [id number], DOB:  Apr 01, 1961, LOS: 8 ADMISSION DATE:  01/07/2021, CONSULTATION DATE: 3/19 REFERRING MD:  Dr. Erlinda Hong, CHIEF COMPLAINT:  TRH    History of Present Illness:  60 y/o M who presented to [facility] on 3/16 for elective cardioversion but presented with shortness of breath, leg swelling and palpitations.   The patient has known CKD IV with baseline sr cr of 5, conservative management, L arm AVF in place in anticipation of HD.  On presentation, he was found to have significant volume overload needing admission per TRH.  After cardioversion he became transiently hypotensive and briefly required neosynephrine gtt.  He was admitted and Nephrology was consulted. Hospital course notable for Proteus UTI, anemia, intolerance to initial HD (chest pain, SVT) requiring brief CVVHD and BIPAP use.  In the ICU he continued to require bipap. He was transitioned to [facility] and had one session on 3/23 with 3L removed / -750 for day.  In the early am of 3/24 the patient pulled his bipap mask off and suffered a respiratory arrest requiring intubation and one round of CPR.   Pertinent  Medical History  CKD IV - HD started this admit (01/15/2021) HTN HLD Chronic Diastolic CHF  Moderate AS Atrial Flutter - on Eliquis  Obesity  Anemia of Chronic Disease   Significant Hospital Events: Including procedures, antibiotic start and stop dates in addition to other pertinent events    3/16 Admit after cardioversion, SOB, leg swelling / palpitations prior to presentation   3/19 PCCM consulted   3/20 CRRT started  3/21 Improved mental status, wore BiPAP overnight. Staph epidermis growing from [facility]. UC growing proteus, abx changed from ceftriaxone to cefepime.  Fever 100.2, -3.6L in last 24 hours.    3/21 CVVHD stopped.  3/23 Tolerated iHD  3/24 Respiratory arrest (pulled bipap off), intubated, re-cultured  Interim History / Subjective:  Intubated overnight with respiratory arrest, bedside  ECHO with ? Of pericardial effusion  Afebrile / WBC remains elevated (25.2) On propofol, fentanyl for sedation Leg wound culture growing multiple organisims   Objective   Blood pressure (!) 111/57, pulse 84, temperature (!) 96.4 F (35.8 C), temperature source Axillary, resp. rate 18, height '5\' 9"'$  (1.753 m), weight 102.3 kg, SpO2 100 %.    Vent Mode: PRVC FiO2 (%):  [40 %-100 %] 40 % Set Rate:  [18 bmp] 18 bmp Vt Set:  [560 mL] 560 mL PEEP:  [5 cmH20] 5 cmH20 Plateau Pressure:  [17 cmH20-19 cmH20] 17 cmH20 IPAP 20 EPAP 8  Intake/Output Summary (Last 24 hours) at 01/17/2021 1210 Last data filed at 01/15/2021 0700 Gross per 24 hour  Intake 2196.35 ml  Output 3000 ml  Net -803.65 ml   Filed Weights   01/28/21 1105 01/28/21 1435 01/25/2021 0455  Weight: 106 kg 102 kg 102.3 kg    Examination: General: chronically ill appearing adult male lying in bed on vent, in NAD HEENT: MM pink/moist, ETT, disconjugate gaze on propofol, reactive when light shown into eye / tries to close eye Neuro: sedate on propofol, fentanyl, spontaneous movement noted CV: s1s2 irr irr, AF on monitor 70's, no m/r/g PULM:  Non-labored on vent, lungs bilaterally clear, diminished bases GI: soft, bsx4 active  Extremities: warm/dry, BLE 1+ pitting pre-tibial edema  Skin: left suprapubic raised area without drainage VVS: L FA AVF with +thrill / bruit       Resolved Hospital Problem list     Assessment & Plan:   PEA  Arrest  Suspect respiratory driven as pulled bipap off -follow up bedside ECHO given concern for effusion on bedside exam overnight  -tele monitoring   Acute Hypercarbic Respiratory Failure in setting of Acute Diastolic CHF Suspect in setting of volume overload / edema, cardiomegaly, +/- contribution of pericardial effusion. No clear evidence of infiltrate on CXR but difficult to discern with cardiomegaly / edema  -PRVC 8cc/kg as rest mode -wean PEEP / FiO2 for sats >90% -SBT / WUA daily   -PAD protocol with propofol / fentanyl -RASS Goal -1 -follow CXR intermittently -consider outpatient sleep study  CKD V / New ESRD  HD initiated this admit, did not tolerate initial iHD due to chest pain on 3/19, then transitioned to CVVHD.  Repeat iHD on 3/23.   -Appreciate Nephrology assistance with patient care  -anticipate he may need CVVHD given pressors / sedation needs -Trend BMP / urinary output -Replace electrolytes as indicated  Acute Exacerbation of dCHF Atrial Fibrillation / Flutter  Chronic Hypotension Acute on Chronic Diastolic HF Moderate Aortic Stenosis  S/P DCCV 3/16 with brief hypotension requiring neo, chest pain post procedure. On eliquis previously for AFlutter.  -tele monitoring  -Appreciate Cardiology  -continue anticoagulation  -transition to PO amiodarone from IV for conservative fluid management  -continue midodrine   Macrocytic Anemia  Reported red stool, hx of gastric ulcer. S/P PRBC 3/19, 3/22. Hgb 6.5 (3/22), transfused, HGB up to 8.3 post transfusion (3/22), now 7.1 (3/23) -follow CBC  -no further evidence of blood in stool, await FOBT with further stool  -transfuse for Hgb <7% -B12 supplementation  BPH -flomax for now  Proteus UTI Chronic Hydradenitis / L Groin Wound, Small Wound on Right Thigh CNS Bacteremia vs Contaminant with 2/4 bottles Leukocytosis CCS evaluated groin wound, no rec's for I&D or CT.   -appreciate CCS > rec's for topical clinda  -continue cefepime for UTI, plan for 7 days total as considered complicated UTI with azotemia  -repeat blood cultures to assess for CNS, hopeful to be able to add stop date on vanco   Left Shoulder Pain  -follow exam once off vent / stable    Moderate Protein Calorie Malnutrition Poor intake since admit with BiPAP use / AMS  -TF per Nutrition   Best practice (evaluated daily)  Diet:  TF Pain/Anxiety/Delirium protocol (if indicated): No VAP protocol (if indicated): Not indicated DVT  prophylaxis: Systemic AC GI prophylaxis: PPI Glucose control:  Basal insulin No Central venous access:  Yes, and it is still needed Arterial line:  N/A Foley:  N/A Mobility:  bed rest  PT consulted: N/A Last date of multidisciplinary goals of care discussion: Brother updated 3/23 at bedside. He is unsure if "[name]" would want life support / aggressive care.  We discussed short term support and he is agreeable.  Code Status:  full code Disposition: ICU    Critical care time: Graham, MSN, APRN, NP-C, AGACNP-BC [place] [facility] 01/11/2021, 1:13 PM   Please see [email or web address] for pager details.   From 7A-7P if no response, please call [phone] After hours, please call [place]   PCCM Attending:   60 yo M, admitted for AHRF, on BIPAP, and ESRD. Decompensated overnight. Intubated, brief cardiac arrest. PEA  BP 96/63   Pulse (!) 37   Temp (!) 96.4 F (35.8 C) (Axillary)   Resp 18   Ht '5\' 9"'$  (1.753 m)   Wt 102.3 kg   SpO2 100%   BMI 33.31  kg/m   Gen: male, intubated on life support  HENT: ETT in place  Heart: RRR, s1 s2 Lungs: BL vented breaths  Abd: Soft   Labs: reviewed   ECHO - pending   A:  PEA Cardiac Arrest  AHRF on MV, intubated on life support  Acute on chronic diastolic heart failure  ESRD on iHD  Anemia Proteus UTI  Chronic hydradenitis  Staph bacteremia   P: Support care post arrest  Continue levophed for shock MAP goal >62mHG  Continue abx cefepime and vancomycin Wean from vent as tolerated, PEEP and FIO2   This patient is critically ill with multiple organ system failure; which, requires frequent high complexity decision making, assessment, support, evaluation, and titration of therapies. This was completed through the application of advanced monitoring technologies and extensive interpretation of multiple databases. During this encounter critical care time was devoted to patient care services  described in this note for 36 minutes.  BGarner Nash DO [facility] 01/24/2021 3:37 PM

## 2021-01-29 NOTE — Brief Op Note (Signed)
01/06/2021 - 02/04/2021  7:17 PM  PATIENT:  Mark [name]  60 y.o. male  PRE-OPERATIVE DIAGNOSIS:  cardiac tamponade  POST-OPERATIVE DIAGNOSIS:  cardiac tamponade  PROCEDURE:  Procedure(s): PERICARDIAL WINDOW WITH ANTERIOR THORACOTOMY APPROACH (Left)  SURGEON:  Surgeon(s) and Role:    * Wonda Olds, MD - Primary  PHYSICIAN ASSISTANT:   ASSISTANTS: staff   ANESTHESIA:   general  EBL:  minimal  BLOOD ADMINISTERED:none  DRAINS: one 51 Fr Blake in pericardium   LOCAL MEDICATIONS USED:  OTHER exparel  SPECIMEN:  Source of Specimen:  pericardium and pericardial fluid for pathology  DISPOSITION OF SPECIMEN:  PATHOLOGY  COUNTS:  YES  TOURNIQUET:  * No tourniquets in log *  DICTATION: .Note written in EPIC  PLAN OF CARE: Admit to inpatient   PATIENT DISPOSITION:  ICU - intubated and hemodynamically stable.   Delay start of Pharmacological VTE agent (>24hrs) due to surgical blood loss or risk of bleeding: yes

## 2021-01-29 NOTE — Progress Notes (Addendum)
eLink Physician-Brief Progress Note Patient Name: Mark [name] DOB: Jun 22, 1961 MRN: [id number]   Date of Service  02/01/2021  HPI/Events of Note  Patient had a KUB to check Cortrack tube position and the KUB needs review.  eICU Interventions  Tube tip is in good position in the proximal duodenum, bedside RN given okay to start tube feeds.        [name] 02/05/2021, 8:36 PM

## 2021-01-29 NOTE — Progress Notes (Signed)
  Echocardiogram 2D Echocardiogram has been performed.  Randa Lynn Talin Rozeboom 01/07/2021, 4:19 PM

## 2021-01-29 NOTE — H&P (Signed)
History and Physical Interval Note:  01/17/2021 5:56 PM  Mark [name]  has presented today for surgery, with the diagnosis of AFIB.  The various methods of treatment have been discussed with the patient and family. After consideration of risks, benefits and other options for treatment, the patient has consented to  PROCEDURE:  Left thoracotomy for pericardial window as a surgical intervention.  The patient's history has been reviewed, patient examined, no change in status, stable for surgery.  I have reviewed the patient's chart and labs.  Questions were answered to the patient's satisfaction.     Wonda Olds

## 2021-01-29 NOTE — Progress Notes (Signed)
OT Cancellation Note  Patient Details Name: Mark [name] MRN: [id number] DOB: [birth year]   Cancelled Treatment:    Reason Eval/Treat Not Completed: Patient not medically ready ((pt with code and intubation this am. will sign off and await new order when pt medically appropriate))  [place], OT/L   Acute OT Clinical Specialist [place] Pager [phone] Office [phone]  01/11/2021, 7:13 AM

## 2021-01-29 NOTE — Progress Notes (Signed)
PT Cancellation Note  Patient Details Name: Mark [name] MRN: [id number] DOB: May 26, 1961   Cancelled Treatment:    Reason Eval/Treat Not Completed: Patient not medically ready (pt with code and intubation this am. will sign off and await new order when pt medically appropriate)   Field [name] [name] 01/28/2021, 7:06 AM  [name], PT Acute Rehabilitation Services Pager: [phone] Office: [phone]

## 2021-01-29 NOTE — Transfer of Care (Signed)
Immediate Anesthesia Transfer of Care Note  Patient: Mark [name]  Procedure(s) Performed: PERICARDIAL WINDOW WITH ANTERIOR THORACOTOMY APPROACH (Left )  Patient Location: SICU  Anesthesia Type:General  Level of Consciousness: Patient remains intubated per anesthesia plan  Airway & Oxygen Therapy: Patient placed on Ventilator (see vital sign flow sheet for setting)  Post-op Assessment: Report given to RN and Post -op Vital signs reviewed and stable  Post vital signs: Reviewed and stable  Last Vitals:  Vitals Value Taken Time  BP 116/70 01/12/2021 1943  Temp    Pulse 86 01/11/2021 1943  Resp 13 01/28/2021 1943  SpO2 97 % 01/23/2021 1943  Vitals shown include unvalidated device data.  Last Pain:  Vitals:   01/07/2021 1600  TempSrc:   PainSc: 0-No pain      Patients Stated Pain Goal: 0 ([id number])  Complications: No complications documented.

## 2021-01-29 NOTE — Progress Notes (Signed)
Code Blue called, cpr being performed. Patient obtained ROSC but was having agonal respiratory effort. Patient was intubated with #7.5 endotracheal tube secured at 25 at the lip. Positive color change with EZ-Cap and equal breath sounds(x-ray pending). CCM at bedside.

## 2021-01-29 NOTE — Code Documentation (Signed)
  Patient Name: Mark [name]   MRN: [id number]   Date of Birth/ Sex: 05/17/1961 , male      Admission Date: 01/08/2021  Attending Provider: Garner Nash, DO  Primary Diagnosis: Fluid overload   Indication: Pt was in his usual state of health until this PM, when he was noted to have removed his BiPAP found to be in respiratory and subsequently developed bradycardia and cardiac arrest. Code blue was subsequently called. At the time of arrival on scene, ACLS protocol was underway.   Technical Description:  - CPR performance duration:  6 minutes  - Was defibrillation or cardioversion used? No   - Was external pacer placed? No  - Was patient intubated pre/post CPR? Yes   Medications Administered: Y = Yes; Blank = No Amiodarone    Atropine    Calcium    Epinephrine  Y  Lidocaine    Magnesium    Norepinephrine    Phenylephrine    Sodium bicarbonate  Y  Vasopressin     Post CPR evaluation:  - Final Status - Was patient successfully resuscitated ? Yes - What is current rhythm? Sinus tach - What is current hemodynamic status? improved  Miscellaneous Information:  - Labs sent, including: CBC, CMP, CXR, Troponin  - Primary team notified?  Yes  - Family Notified? Yes  - Additional notes/ transfer status: Obtained ROSC after 1 round of compressions. Updated primary provider who was at bedside and will continue care.      Iona Beard, MD  01/09/2021, 1:26 AM

## 2021-01-29 NOTE — Anesthesia Postprocedure Evaluation (Signed)
Anesthesia Post Note  Patient: Mark [name]  Procedure(s) Performed: PERICARDIAL WINDOW WITH ANTERIOR THORACOTOMY APPROACH (Left )     Patient location during evaluation: SICU Anesthesia Type: General Level of consciousness: sedated Pain management: pain level controlled Vital Signs Assessment: post-procedure vital signs reviewed and stable Respiratory status: patient remains intubated per anesthesia plan Cardiovascular status: stable Postop Assessment: no apparent nausea or vomiting Anesthetic complications: no   No complications documented.  Last Vitals:  Vitals:   02/02/2021 2015 01/15/2021 2030  BP:  (!) 103/47  Pulse: (!) 36 (!) 36  Resp: 18 18  Temp: (!) 35.8 C   SpO2: 99% 99%    Last Pain:  Vitals:   02/05/2021 2015  TempSrc: Axillary  PainSc:                  Mark [name] Mark [name]

## 2021-01-29 NOTE — Progress Notes (Signed)
[facility] NEPHROLOGY PROGRESS NOTE  Assessment/ Plan: Pt is a 60 y.o. yo male  With PMH of HTN, DM, HLD, obesity, CKD stage V baseline creatinine level around 5 admitted with acute diastolic CHF.  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  Started HD 3/19 however around 45 minutes into the treatment patient developed chest pain, tachycardia and worsening hypoxia.  Rapid response called and later patient was transferred to ICU.  He is grossly volume overload, systolic blood pressure around 90s and req'ed BiPAP.  Unable to ultrafiltrate with IHD therefore started CRRT on 3/19 mainly for ultrafiltration and azotemia. CRRT completed on 3/22. -s/p IHD 3/23.  At this time will hold off on CRRT until he is further evaluated with a echo for probable pericardial effusion.  Would have a low threshold to put him back on CRRT especially from a volume/respiratory standpoint, but fortunately we were able to adequately ultrafiltrate him yesterday.  At the very least, he may end up on CRRT by tomorrow morning -K 4.3, bicarb 26 -He has a left radiocephalic fistula that was created in October, 2017 by Dr. Scot Dock that is mature, able to use for IHD.  Cardiac arrest, PEA 3/24 -one round of cpr and epi. Possibly respiratory/hypoxia driven  Shock -pressor support per CCM, on midodrine  Acute exacerbation of diastolic heart failure:  Started CRRT mainly for ultrafiltration. UF with HD, see above  Pulmonary Edema -on cxr 3/23, UF plan with renal replacement therapy as above. CXR 3/24 reviewed.   Hypertension: metoprolol on hold   Anemia of chronic disease: Iron deficiency therefore ordered IV iron and ESA on 3/17.  Continue ESA.  Transfuse for hgb <7  Secondary hyperparathyroidism: Both calcium and phosphorus level elevated.  Discontinued MVA and hold any vitamin D.  Increased Auryxia yesterday but currently n.p.o.  Now expect to improve with  RRT.  Aflutter -cardiology following. On eliquis  Subjective: Seen and examined in ICU. Code blue overnight after he pulled his bipap off.  O2 sat dropped to the 60s to 70s per nursing report.  He then had bradycardia and then PEA.  He received CPR and epinephrine.  Currently intubated.  Discussed with brother at the bedside. There was question of pericardial effusion post arrest and whether or not there was an etiology of his hypotension (tamponade).  He is due for repeat echo.  He did tolerate his dialysis session yesterday and had a total UF of 3 L. Objective Vital signs in last 24 hours: Vitals:   01/20/2021 0700 01/07/2021 0715 01/13/2021 0800 01/11/2021 0808  BP: (!) 94/59  103/70 (!) 114/57  Pulse:  (!) 36 (!) 52 76  Resp: '18 18 18 18  '$ Temp:    (!) 96.8 F (36 C)  TempSrc:    Axillary  SpO2:  100% 100% 100%  Weight:      Height:       Weight change: 0.3 kg  Intake/Output Summary (Last 24 hours) at 01/22/2021 1048 Last data filed at 01/06/2021 0700 Gross per 24 hour  Intake 2196.35 ml  Output 3000 ml  Net -803.65 ml       Labs: Basic Metabolic Panel: Recent Labs  Lab 01/28/21 1600 02/03/2021 0122 01/07/2021 0359 01/14/2021 0515  NA 133* 137 134* 135  K 3.9 4.2 4.1 4.3  CL 98 97*  --  99  CO2 24 27  --  26  GLUCOSE 132* 182*  --  154*  BUN 53* 62*  --  65*  CREATININE 3.00* 3.44*  --  3.51*  CALCIUM 11.3* 11.7*  --  11.8*  PHOS 5.3* 6.5*  --  6.4*   Liver Function Tests: Recent Labs  Lab 01/24/21 0303 01/25/21 1541 01/28/21 1600 01/08/2021 0122 01/12/2021 0515  AST 22  --   --  35  --   ALT 15  --   --  17  --   ALKPHOS 68  --   --  85  --   BILITOT 1.2  --   --  0.9  --   PROT 7.6  --   --  7.8  --   ALBUMIN 2.4*   < > 2.4* 2.4* 2.2*   < > = values in this interval not displayed.   No results for input(s): LIPASE, AMYLASE in the last 168 hours. No results for input(s): AMMONIA in the last 168 hours. CBC: Recent Labs  Lab 01/24/21 0303 01/24/21 2254  01/25/21 0132 01/13/2021 0430 01/25/2021 0808 01/27/21 0200 01/27/21 1432 01/28/21 0420 01/28/21 1554 01/18/2021 0122 01/08/2021 0359 02/02/2021 0515  WBC 18.6*   < > 20.3* 21.3*  --  21.0*  --  22.3*  --  31.0*  --  25.2*  NEUTROABS 15.8*  --  17.0* 18.4*  --   --   --   --   --   --   --   --   HGB 7.3*   < > 7.1* 7.1*   < > 6.5*   < > 7.1*   < > 8.5* 8.5* 7.5*  HCT 23.2*   < > 23.2* 22.3*   < > 20.8*   < > 22.9*   < > 26.5* 25.0* 23.2*  MCV 100.4*   < > 100.9* 100.0  --  102.5*  --  100.9*  --  99.3  --  97.5  PLT 256   < > 254 278  --  241  --  220  --  239  --  240   < > = values in this interval not displayed.   Cardiac Enzymes: Recent Labs  Lab 01/24/21 0303  CKTOTAL 22*   CBG: Recent Labs  Lab 01/28/21 2041 01/28/21 2332 01/16/2021 0437 01/06/2021 0806  GLUCAP 144* 146* 141* 140*    Iron Studies:  No results for input(s): IRON, TIBC, TRANSFERRIN, FERRITIN in the last 72 hours. Studies/Results: DG CHEST PORT 1 VIEW  Result Date: 01/08/2021 CLINICAL DATA:  Intubation.  Pulmonary edema. EXAM: PORTABLE CHEST 1 VIEW COMPARISON:  01/14/2021. FINDINGS: Endotracheal tube, feeding tube, right IJ line stable position. Severe cardiomegaly again noted. Low lung volumes with persistent prominent bibasilar atelectasis. Mild infiltrate left mid lung cannot be excluded on today's exam. No prominent pleural effusion. No pneumothorax. IMPRESSION: 1.  Lines and tubes in stable position. 2.  Severe cardiomegaly again noted. 3. Low lung volumes with persistent prominent bibasilar atelectasis. Mild infiltrate left mid lung cannot be excluded on today's exam. Electronically Signed   By: Marcello Moores  Register   On: 01/06/2021 07:12   DG CHEST PORT 1 VIEW  Result Date: 01/11/2021 CLINICAL DATA:  Respiratory failure EXAM: PORTABLE CHEST 1 VIEW COMPARISON:  Radiograph 01/28/2021, CT 11/30/2019 FINDINGS: Endotracheal tube terminates in the mid trachea, 4 cm from the carina. Transesophageal tube tip terminates  below the margins of imaging, beyond the GE junction. Right IJ catheter tip terminates in the mid SVC. Redemonstration of the extreme cardiomegaly. Some increasing retrocardiac opacity bilaterally likely reflecting worsening volume loss/atelectatic changes of the lower  lobes with some mild pulmonary vascular congestion and hazy interstitial opacity suggestive of some edematous change. No pneumothorax. Partial obscuration of the hemidiaphragms may be related to atelectasis or layering fluid. No acute osseous or other soft tissue abnormality. IMPRESSION: 1. Lines and tubes as above. 2. Increasing retrocardiac opacity in the medial lung bases likely reflecting worsening volume loss/atelectasis in lower lobes. 3. Persisting hazy opacities and vascular congestion suggesting at least mild interstitial edema. Possible effusions. Electronically Signed   By: Lovena Le M.D.   On: 01/27/2021 01:33   DG CHEST PORT 1 VIEW  Result Date: 01/28/2021 CLINICAL DATA:  Pulmonary edema, shortness of breath EXAM: PORTABLE CHEST 1 VIEW COMPARISON:  Radiograph 01/31/2021 FINDINGS: Right IJ approach central venous catheter tip terminates in the mid to upper SVC. Extreme cardiomegaly is similar to priors. Persistent pulmonary vascular congestion. Increasing hazy and patchy opacities are seen in the left mid to lower lung with more confluent opacity in the right retrocardiac space as well. No pneumothorax. Obscuration of the bilateral hemidiaphragms may reflect some layering bilateral effusions though there is some partial collimation peripherally. No acute osseous or soft tissue abnormality. IMPRESSION: Worsening hazy and patchy opacities in the left mid to lower lung with more confluent opacity in the right retrocardiac space. Findings are most compatible with worsening pulmonary edema. Electronically Signed   By: Lovena Le M.D.   On: 01/28/2021 06:35   DG Abd Portable 1V  Result Date: 01/28/2021 CLINICAL DATA:  Feeding tube  placement. EXAM: PORTABLE ABDOMEN - 1 VIEW COMPARISON:  None. FINDINGS: The bowel gas pattern is normal. Distal tip of Dobbhoff feeding tube is seen in expected portion of second portion of duodenum. No radio-opaque calculi or other significant radiographic abnormality are seen. IMPRESSION: Distal tip of Dobbhoff feeding tube is seen in expected portion of second portion of duodenum. No evidence of bowel obstruction or ileus. Electronically Signed   By: Marijo Conception M.D.   On: 01/28/2021 15:06    Medications: Infusions: . ceFEPime (MAXIPIME) IV Stopped (01/28/21 1808)  . feeding supplement (VITAL 1.5 CAL) 20 mL/hr at 01/28/21 1700  . fentaNYL infusion INTRAVENOUS 50 mcg/hr (02/03/2021 0700)  . norepinephrine (LEVOPHED) Adult infusion 3 mcg/min (01/31/2021 0950)  . propofol (DIPRIVAN) infusion 5 mcg/kg/min (01/22/2021 0700)  . vancomycin Stopped (01/28/21 1438)    Scheduled Medications: . sodium chloride   Intravenous Once  . amiodarone  400 mg Oral BID   Followed by  . [START ON 02/05/2021] amiodarone  200 mg Oral BID  . apixaban  5 mg Per Tube BID  . Chlorhexidine Gluconate Cloth  6 each Topical Q0600  . clindamycin   Topical BID  . [START ON 01/30/2021] darbepoetin (ARANESP) injection - DIALYSIS  150 mcg Intravenous Q Fri-HD  . diclofenac Sodium  2 g Topical QID  . docusate  100 mg Per Tube BID  . etomidate      . feeding supplement (PROSource TF)  45 mL Per Tube TID  . fentaNYL      . ketamine HCl      . loratadine  10 mg Per Tube Daily  . midazolam      . midodrine  15 mg Per Tube TID WC  . multivitamin  1 tablet Per Tube QHS  . pantoprazole sodium  40 mg Per Tube Daily  . polyethylene glycol  17 g Per Tube Daily  . rocuronium bromide      . sodium chloride flush  10-40 mL Intracatheter Q12H  have reviewed scheduled and prn medications.  Physical Exam: General: in mild distress Heart: s1s2, irreg irreg Lungs: diminished air entry bibasilar w/ fine crackles, no w/r/r,  labored, bl chest expansion Abdomen:soft, Non-tender, distended Extremities: 1+ Bilateral lower extremity edema Neuro: drowsy but responds to vocal and tactile stimuli, moves all ext spontaneously Dialysis Access: LUE AV fistula has good thrill and bruit  Antwine Agosto 01/20/2021,10:48 AM  LOS: 8 days

## 2021-01-29 NOTE — Progress Notes (Signed)
NAME:  Mark [name], MRN:  [id number], DOB:  June 15, 1961, LOS: 8 ADMISSION DATE:  01/18/2021, CONSULTATION DATE: 3/19 REFERRING MD:  Dr. Erlinda Hong, CHIEF COMPLAINT:  TRH    History of Present Illness:  60 y/o M who presented to [facility] on 3/16 with shortness of breath, leg swelling and palpitations.   The patient has known CKD IV with baseline sr cr of 5, conservative management, L arm AVF in place in anticipation of HD.  He was brought in by Cardiology 3/16 for elective cardioversion.  On presentation, he was found to have significant volume overload needing admission per TRH.  After cardioversion he became transiently hypotensive and briefly required neosynephrine gtt.  He was admitted and Nephrology was consulted. He was diuresed with 1.5L UOP. Urinalysis grew 100k proteus, sensitivities pending. He was started on rocephin.  The patient remained on minimal O2. He had intermittent complaints of shoulder / left chest pain.  There were concerns for possible blood in stool, FOBT pending.   During his initial iHD on 3/19 evening, the patient began having chest pain and shortness of breath approximately 45 minutes into the therapy with HR into the 140's.  He was placed on a NRB and HD was stopped. He was transferred back to his room and placed on BiPAP. ABG showed an acute hypercarbic respiratory failure (7.154 / 71). CXR showed marked cardiomegaly with basilar atelectasis. RRT was called to assess the patient.  Cardiology was consulted, he was given 120 mg IV lasix and recommended to be started on an amiodarone infusion.  Troponin was assessed and 70, lactic acid 1.2. The patient was transferred to ICU for further observation.      In the early morning hours of 3/24 he became bradycardic and hypoxic after pulling his Bipap off. He briefly lost pulses and one round of CPR done before ROSC was obtained.  He was intubated by respiratory therapy.  Post-code he them became hypotensive and bedside POCUS showed  significant pericardial effusion with possible tamponade.  Cardiology contacted who also evaluated with ultrasound and felt that the effusion was likely sub-acute  Pertinent  Medical History  CKD IV - HD started this admit (01/14/2021) HTN HLD Chronic Diastolic CHF  Moderate AS Atrial Flutter - on Eliquis  Obesity  Anemia of Chronic Disease   Significant Hospital Events: Including procedures, antibiotic start and stop dates in addition to other pertinent events    3/16 Admit after cardioversion, SOB, leg swelling / palpitations prior to presentation   3/19 PCCM consulted   3/20 CRRT started  3/21 Improved mental status, wore BiPAP overnight. Staph epidermis growing from [facility]. UC growing proteus, abx changed from ceftriaxone to cefepime.  Fever 100.2, -3.6L in last 24 hours.    3/21 CVVHD stopped.  3/24 Code blue in the early morning hours after pulling his Bipap off and becoming bradycardic and hypoxic, ROSC obtained after one round ACLS.  Large peri-cardial effusion and possible tamponade on POCUS, cardiology contacted  Interim History / Subjective:  Afebrile/ WBC increased to 22.3 from 21 +1736m in 24 hours BiPAP, Amio and heparin gtt HGB improved from 6.5 to 7.4 after 1 unit PRBC  Objective   Blood pressure 121/83, pulse 89, temperature 98 F (36.7 C), temperature source Axillary, resp. rate 19, height '5\' 9"'$  (1.753 m), weight 102 kg, SpO2 100 %.    Vent Mode: PRVC FiO2 (%):  [40 %-100 %] 100 % Set Rate:  [18 bmp] 18 bmp Vt Set:  [560 mL] 560  mL PEEP:  [5 cmH20] 5 cmH20 Plateau Pressure:  [19 cmH20] 19 cmH20 IPAP 20 EPAP 8  Intake/Output Summary (Last 24 hours) at 01/08/2021 0252 Last data filed at 01/25/2021 0000 Gross per 24 hour  Intake 1100.56 ml  Output 3000 ml  Net -1899.44 ml   Filed Weights   01/28/21 0456 01/28/21 1105 01/28/21 1435  Weight: 105.7 kg 106 kg 102 kg    General:  Obese M, intubated, starting to wake up post-arrest HEENT: MM pink/moist, ETT  in place Neuro: Sedated, moving all extremities  CV: s1s2 rrr, no m/r/g PULM:  Intubated, mechanical breath sounds bilaterally  GI: soft, bsx4 active  Extremities: warm/dry, no edema  Skin: no rashes or lesions   Lab/imaging reviewed  CXR>>increasing retrocardiac opacity, vascular congestion  Resolved Hospital Problem list     Assessment & Plan:    Shock following respiratory and cardiac arrest, Pericardial effusion Brief PEA arrest following pulling Bipap off,  bradycardia and one round of chest compressions Waking up post-ROSC, but with new hypotension Intubated by RT P: -Cardiology MD assessed patient overnight, felt that the timing of the effusion was difficult to identify and there were likely confounding factors clouding the etiology of patient's shock, currently on Levophed 21mg.  If shock worsens then Cards will come re-assess -already covered with Vanc/Cefepime, not acidotic, Hgb stable -repeat Echo -continue Levophed to maintain MAP >65 -Fentanyl gtt --Maintain full vent support with SAT/SBT as tolerated -titrate Vent setting to maintain SpO2 greater than or equal to 90%. -HOB elevated 30 degrees. -Plateau pressures less than 30 cm H20.  -Follow chest x-ray, ABG prn.   -Bronchial hygiene and RT/bronchodilator protocol.      Acute Hypercarbic Respiratory Failure in setting of Acute Diastolic CHF Intubated secondary to the above ABG post-intubation without hypercarbia P: -continue ventilator support -SBT/SAT as able -Consider outpatient sleep study to determine presence of OSA/OHS post discharge   CKD V / New ESRD  HD initiated this admit, did not tolerate initial HD due to chest pain P: -back on CRRT post-arrest -Appreciate Nephrology assistance -HD volume removal -Continue to trend renal panel and UOP -Conservative electrolyte management if indicated  Acute Exacerbation of dCHF Atrial Flutter  Chronic Hypotension Acute on Chronic Diastolic  HF S/P DCCV 30000000with brief hypotension requiring neo, had some chest pain then. On eliquis previously for AFlutter.  -Appreciate cardiology input, will continue to follow recommendations  -Continue heparin GTT, will plan to transition back to eliquis today -Continue midodrine  -Continue amiodarone IV per cardiology while on bipap  Macrocytic Anemia  Reported red stool, hx of gastric ulcer. S/P PRBC 3/19, 3/22. Hgb 6.5 (3/22), transfused, HGB up to 8.3 post transfusion (3/22), now 7.1 (3/23)  Stable post-arrest P: -Continue trending CBC -Follow up FOBT when sent due to hx of gastric ulcer. No other obvious signs of bleeding. May also be dilutional in the setting of volume overload. -Transfuse for HGB of <7 -Continue B12 supplimentation  BPH -Continue flomax  Proteus UTI Perineal Abscess, Right Thigh Wound  CNS Bacteremia ? Contaminant with 2/4 bottles Leukocytosis Remains Afebrile  -Appreciate CCS evaluation of groin wounds, continue topical clinda per recs. -Continue cefepime for UTI, appreciate pharmacy assistance -Continue vancomycin for bacteremia, appreciate pharmacy assistance  Left Shoulder Pain  -Will follow up exam once extubated  Moderate Protein Calorie Malnutrition Poor PO intake -Will trial off BiPAP today. If poor PO intake will place Cortrak and TF orders.  Best practice (evaluated daily)  Diet:  Renal  Diet with 129m fluid restriction Pain/Anxiety/Delirium protocol (if indicated): Yes (RASS goal 0) VAP protocol (if indicated): Yes DVT prophylaxis: Systemic AC GI prophylaxis: N/A and PPI Glucose control:  Basal insulin No Central venous access:  Yes, and it is still needed Arterial line:  N/A Foley:  N/A Mobility:  bed rest  PT consulted: N/A Last date of multidisciplinary goals of care discussion: REdd Arbour(brother) updated overnight 3/24 Code Status:  full code Disposition: ICU    Critical care time:  40 Minutes   CRITICAL CARE Performed by:  LOtilio CarpenGleason   Total critical care time: 40 minutes  Critical care time was exclusive of separately billable procedures and treating other patients.  Critical care was necessary to treat or prevent imminent or life-threatening deterioration.  Critical care was time spent personally by me on the following activities: development of treatment plan with patient and/or surrogate as well as nursing, discussions with consultants, evaluation of patient's response to treatment, examination of patient, obtaining history from patient or surrogate, ordering and performing treatments and interventions, ordering and review of laboratory studies, ordering and review of radiographic studies, pulse oximetry and re-evaluation of patient's condition.      CRITICAL CARE Performed by: LOtilio CarpenGleason   Total critical care time: 40 minutes  Critical care time was exclusive of separately billable procedures and treating other patients.  Critical care was necessary to treat or prevent imminent or life-threatening deterioration.  Critical care was time spent personally by me on the following activities: development of treatment plan with patient and/or surrogate as well as nursing, discussions with consultants, evaluation of patient's response to treatment, examination of patient, obtaining history from patient or surrogate, ordering and performing treatments and interventions, ordering and review of laboratory studies, ordering and review of radiographic studies, pulse oximetry and re-evaluation of patient's condition.   LOtilio CarpenGleason, PA-C [facility] See Amion for pager If no response to pager , please call [phone] 7pm After 7:00 pm call Elink  [phone]

## 2021-01-29 NOTE — Progress Notes (Signed)
Brother updated at bedside on events of evening and plan of care.  Questions answered.  Support offered.    Noe Gens, MSN, APRN, NP-C, AGACNP-BC [facility] 01/11/2021, 2:08 PM   Please see [email or web address] for pager details.   From 7A-7P if no response, please call [phone] After hours, please call ELink [phone]

## 2021-01-29 NOTE — Progress Notes (Signed)
Progress Note  Patient Name: Mark [name] Date of Encounter: 01/06/2021  [facility] Cardiologist: [name], MD   Subjective   Around 1 AM this morning, he was found with his bipap mask off and O2 sats dropped into the 60s-70s per nursing report. He had bradycardia and then PEA. ROSC was achieved with one round of CPR and epi. Patient intubated.   Brother [name] at bedside this AM. Patient remains intubated and sedated.  Inpatient Medications    Scheduled Meds: . sodium chloride   Intravenous Once  . amiodarone  400 mg Oral BID   Followed by  . [START ON 02/05/2021] amiodarone  200 mg Oral BID  . apixaban  5 mg Per Tube BID  . Chlorhexidine Gluconate Cloth  6 each Topical Q0600  . clindamycin   Topical BID  . [START ON 01/30/2021] darbepoetin (ARANESP) injection - DIALYSIS  150 mcg Intravenous Q Fri-HD  . diclofenac Sodium  2 g Topical QID  . docusate  100 mg Per Tube BID  . etomidate      . feeding supplement (PROSource TF)  45 mL Per Tube TID  . fentaNYL      . ketamine HCl      . loratadine  10 mg Per Tube Daily  . midazolam      . midodrine  15 mg Per Tube TID WC  . multivitamin  1 tablet Per Tube QHS  . pantoprazole sodium  40 mg Per Tube Daily  . polyethylene glycol  17 g Per Tube Daily  . rocuronium bromide      . sodium chloride flush  10-40 mL Intracatheter Q12H   Continuous Infusions: . amiodarone 30 mg/hr (01/28/2021 0700)  . ceFEPime (MAXIPIME) IV Stopped (01/28/21 1808)  . feeding supplement (VITAL 1.5 CAL) 20 mL/hr at 01/28/21 1700  . fentaNYL infusion INTRAVENOUS 50 mcg/hr (01/08/2021 0700)  . norepinephrine (LEVOPHED) Adult infusion 3 mcg/min (01/13/2021 0950)  . propofol (DIPRIVAN) infusion 5 mcg/kg/min (01/14/2021 0700)  . vancomycin Stopped (01/28/21 1438)   PRN Meds: acetaminophen **OR** acetaminophen, fentaNYL, Muscle Rub, polyethylene glycol, sodium chloride flush, traMADol   Vital Signs    Vitals:   01/28/2021 0700 01/19/2021 0715  01/20/2021 0800 01/22/2021 0808  BP: (!) 94/59  103/70 (!) 114/57  Pulse:  (!) 36 (!) 52 76  Resp: '18 18 18 18  '$ Temp:    (!) 96.8 F (36 C)  TempSrc:    Axillary  SpO2:  100% 100% 100%  Weight:      Height:        Intake/Output Summary (Last 24 hours) at 01/30/2021 1025 Last data filed at 01/10/2021 0700 Gross per 24 hour  Intake 2196.35 ml  Output 3000 ml  Net -803.65 ml   Last 3 Weights 01/22/2021 01/28/2021 01/28/2021  Weight (lbs) 225 lb 8.5 oz 224 lb 13.9 oz 233 lb 11 oz  Weight (kg) 102.3 kg 102 kg 106 kg      Telemetry    Atrial flutter with controlled ventricular response. Around time of code was in PEA, longest pause 4 sec- Personally Reviewed  ECG    afib RVR, diffuse inferolateral ST changes- Personally Reviewed  Physical Exam   GEN: no distress, intubated and sedated NECK: IJ HD catheter CARDIAC: rate controlled irregular rhythm, normal S1 and S2, no rubs or gallops. 2/6 systolic murmur. VASCULAR: Radial pulses 2+ bilaterally.  RESPIRATORY:  Largely clear, ventilated breath sounds ABDOMEN: Soft, non-tender, non-distended SKIN: Warm and dry, no edema  NEUROLOGIC:  Intubated and sedated PSYCHIATRIC:  Intubated and sedated  Labs    High Sensitivity Troponin:   Recent Labs  Lab 01/24/21 1907 01/24/21 2304 01/25/2021 0450 01/09/2021 0122 01/25/2021 0408  TROPONINIHS 70* 64* 41* 87* 111*      Chemistry Recent Labs  Lab 01/24/21 0303 01/24/21 2254 01/28/21 1600 01/09/2021 0122 01/14/2021 0359 01/12/2021 0515  NA 132*   < > 133* 137 134* 135  K 4.4   < > 3.9 4.2 4.1 4.3  CL 95*   < > 98 97*  --  99  CO2 23   < > 24 27  --  26  GLUCOSE 145*   < > 132* 182*  --  154*  BUN 141*   < > 53* 62*  --  65*  CREATININE 5.75*   < > 3.00* 3.44*  --  3.51*  CALCIUM 11.6*   < > 11.3* 11.7*  --  11.8*  PROT 7.6  --   --  7.8  --   --   ALBUMIN 2.4*   < > 2.4* 2.4*  --  2.2*  AST 22  --   --  35  --   --   ALT 15  --   --  17  --   --   ALKPHOS 68  --   --  85  --   --    BILITOT 1.2  --   --  0.9  --   --   GFRNONAA 10*   < > 23* 19*  --  19*  ANIONGAP 14   < > 11 13  --  10   < > = values in this interval not displayed.     Hematology Recent Labs  Lab 01/28/21 0420 01/28/21 1554 01/08/2021 0122 01/18/2021 0359 01/09/2021 0515  WBC 22.3*  --  31.0*  --  25.2*  RBC 2.27*  --  2.67*  --  2.38*  HGB 7.1*   < > 8.5* 8.5* 7.5*  HCT 22.9*   < > 26.5* 25.0* 23.2*  MCV 100.9*  --  99.3  --  97.5  MCH 31.3  --  31.8  --  31.5  MCHC 31.0  --  32.1  --  32.3  RDW 17.2*  --  17.6*  --  17.1*  PLT 220  --  239  --  240   < > = values in this interval not displayed.    BNPNo results for input(s): BNP, PROBNP in the last 168 hours.   DDimer No results for input(s): DDIMER in the last 168 hours.   Radiology    DG CHEST PORT 1 VIEW  Result Date: 01/16/2021 CLINICAL DATA:  Intubation.  Pulmonary edema. EXAM: PORTABLE CHEST 1 VIEW COMPARISON:  01/19/2021. FINDINGS: Endotracheal tube, feeding tube, right IJ line stable position. Severe cardiomegaly again noted. Low lung volumes with persistent prominent bibasilar atelectasis. Mild infiltrate left mid lung cannot be excluded on today's exam. No prominent pleural effusion. No pneumothorax. IMPRESSION: 1.  Lines and tubes in stable position. 2.  Severe cardiomegaly again noted. 3. Low lung volumes with persistent prominent bibasilar atelectasis. Mild infiltrate left mid lung cannot be excluded on today's exam. Electronically Signed   By: Marcello Moores  Register   On: 01/22/2021 07:12   DG CHEST PORT 1 VIEW  Result Date: 02/02/2021 CLINICAL DATA:  Respiratory failure EXAM: PORTABLE CHEST 1 VIEW COMPARISON:  Radiograph 01/28/2021, CT 11/30/2019 FINDINGS: Endotracheal tube terminates in the mid trachea,  4 cm from the carina. Transesophageal tube tip terminates below the margins of imaging, beyond the GE junction. Right IJ catheter tip terminates in the mid SVC. Redemonstration of the extreme cardiomegaly. Some increasing  retrocardiac opacity bilaterally likely reflecting worsening volume loss/atelectatic changes of the lower lobes with some mild pulmonary vascular congestion and hazy interstitial opacity suggestive of some edematous change. No pneumothorax. Partial obscuration of the hemidiaphragms may be related to atelectasis or layering fluid. No acute osseous or other soft tissue abnormality. IMPRESSION: 1. Lines and tubes as above. 2. Increasing retrocardiac opacity in the medial lung bases likely reflecting worsening volume loss/atelectasis in lower lobes. 3. Persisting hazy opacities and vascular congestion suggesting at least mild interstitial edema. Possible effusions. Electronically Signed   By: Lovena Le M.D.   On: 01/30/2021 01:33   DG CHEST PORT 1 VIEW  Result Date: 01/28/2021 CLINICAL DATA:  Pulmonary edema, shortness of breath EXAM: PORTABLE CHEST 1 VIEW COMPARISON:  Radiograph 01/16/2021 FINDINGS: Right IJ approach central venous catheter tip terminates in the mid to upper SVC. Extreme cardiomegaly is similar to priors. Persistent pulmonary vascular congestion. Increasing hazy and patchy opacities are seen in the left mid to lower lung with more confluent opacity in the right retrocardiac space as well. No pneumothorax. Obscuration of the bilateral hemidiaphragms may reflect some layering bilateral effusions though there is some partial collimation peripherally. No acute osseous or soft tissue abnormality. IMPRESSION: Worsening hazy and patchy opacities in the left mid to lower lung with more confluent opacity in the right retrocardiac space. Findings are most compatible with worsening pulmonary edema. Electronically Signed   By: Lovena Le M.D.   On: 01/28/2021 06:35   DG Abd Portable 1V  Result Date: 01/28/2021 CLINICAL DATA:  Feeding tube placement. EXAM: PORTABLE ABDOMEN - 1 VIEW COMPARISON:  None. FINDINGS: The bowel gas pattern is normal. Distal tip of Dobbhoff feeding tube is seen in expected  portion of second portion of duodenum. No radio-opaque calculi or other significant radiographic abnormality are seen. IMPRESSION: Distal tip of Dobbhoff feeding tube is seen in expected portion of second portion of duodenum. No evidence of bowel obstruction or ileus. Electronically Signed   By: Marijo Conception M.D.   On: 01/28/2021 15:06    Cardiac Studies   Echocardiogram 10/09/2020: 1. No subcostal views.  2. Left ventricular ejection fraction, by estimation, is 60 to 65%. The  left ventricle has normal function. The left ventricle has no regional  wall motion abnormalities. There is mild left ventricular hypertrophy.  Left ventricular diastolic parameters  are indeterminate.  3. Right ventricular systolic function is normal. The right ventricular  size is normal.  4. Left atrial size was severely dilated.  5. The mitral valve is normal in structure. Mild mitral valve  regurgitation. No evidence of mitral stenosis. Severe mitral annular  calcification.  6. The aortic valve is normal in structure. Aortic valve regurgitation is  trivial. Moderate aortic valve stenosis.   DCCV 01/08/2021 Evaluation: Findings: Post procedure EKG shows:Sinus with mobitz 1 AVB and PACs Complications:None  After the procedure, the patient was hypotensive requiring pressor support. Was also notably overloaded on physical exam. Given tenuous status, decision was made to admit to in-patient for pressor support and IV diuresis. Plan discussed with admitting physician, Dr. Ellyn Hack and primary cardiologist Dr. Margaretann Loveless.  Patient Profile     60 y.o. male with a history of moderate AS, CKD V, HTN, gout, persistent atrial flutter, DM2, and hx of hip dislocations  and recent acute on chronic diastolic CHF with acute respiratory failure>> found to be in atrial flutter. Plan was for OP DCCV>>scheduled for 01/31/2021. On arrival pt found to be severely volume overloaded, was hypotensive post procedure requiring  admission.  Assessment & Plan    PEA arrest 01/06/2021 Acute hypoxic respiratory failure -suspect respiratory given bipap off, low O2 sats, rapid improvement post CPR and intubation -will get echocardiogram today as there was some concern for effusion on bedside echo  Typical atrial flutter with variable block: with coarse flutter/fib on telemetry as well -outpatient DCCV 01/16/2021, initially converted to sinus with Mobitz 1 AV block, PACs/PVCs. Returned to atrial flutter -based on telemetry, converted back to sinus rhythm with very long PR around 3 AM 01/27/21. However, early 3/23 appears he is in an atypical flutter but is ventricularly rate controlled. -converted to heparin on 3/21 due to concern for ST changes. 3/22 required transfusion for drop in Hgb. Should remain on anticoagulation due to recent cardioversion unless absolutely necessary to stop. Given acute decompensation/PEA arrest, continue heparin drip for now CHA2DS2/VAS Stroke Risk Points=3 -continue IV amiodarone while intubated  Acute on chronic diastolic heart failure CKD stage V progressed to ESRD -appreciate management per nephrology -CRRT stopped 3/21, HD removed 3L fluid 3/23 -no blood pressure room for additional medications, volume management per nephrology  Hypotension -on midodrine and low dose levophed currently  Anemia of chronic disease -Hgb 7.5 -needs anticoagulation given recent cardioversion unless there is life threatening bleeding  Moderate aortic stenosis -murmur present  Urinary tract infection, chronic right thigh wound, ? coag negative staph bacteremia vs contaminant -per primary team  CRITICAL CARE Patient is critically ill with multiple organ systems affected and requires high complexity decision making. Total critical care time: 50 minutes. This time includes gathering of history, evaluation of patient's response to treatment, examination of patient, review of laboratory and imaging studies, and  coordination with consultants. Greater than 50% of time spent in direct patient care.  For questions or updates, please contact [place] Please consult [email or web address] for contact info under     Signed, Buford Dresser, MD  01/09/2021, 10:25 AM

## 2021-01-29 NOTE — Procedures (Signed)
Central Venous Catheter Insertion Procedure Note  Mark [name]  ME:[id number]  01/31/1961  Date:01/27/2021  Time:3:52 AM   Provider Performing:[name]   Procedure: Insertion of Non-tunneled Central Venous Catheter([id number]) with US guidance [id number])   Indication(s) Medication administration and Difficult access  Consent Unable to obtain consent due to emergent nature of procedure.  Anesthesia Topical only with 1% lidocaine   Timeout Verified patient identification, verified procedure, site/side was marked, verified correct patient position, special equipment/implants available, medications/allergies/relevant history reviewed, required imaging and test results available.  Sterile Technique Maximal sterile technique including full sterile barrier drape, hand hygiene, sterile gown, sterile gloves, mask, hair covering, sterile ultrasound probe cover (if used).  Procedure Description Area of catheter insertion was cleaned with chlorhexidine and draped in sterile fashion.  With real-time ultrasound guidance a central venous catheter was placed into the left femoral vein. Nonpulsatile blood flow and easy flushing noted in all ports.  The catheter was sutured in place and sterile dressing applied.  Complications/Tolerance None; patient tolerated the procedure well. Chest X-ray is ordered to verify placement for internal jugular or subclavian cannulation.   Chest x-ray is not ordered for femoral cannulation.  EBL Minimal  Specimen(s) None  The entire procedure was supervised/proctored by Mickel Baas Gleason, PA-C.  [name], PA-C [facility] 01/19/2021 3:53 AM  Please see [email or web address] for pager details.

## 2021-01-29 NOTE — Progress Notes (Addendum)
Nutrition Follow Up  DOCUMENTATION CODES:   Not applicable  INTERVENTION:   No BM in 6 days, recommend strengthening bowel regimen.  Tube feeding:  -Vital 1.5 @ 55 ml/hr via post pyloric Cortrak (1320 ml) -ProSource TF 45 ml QID  Provides: 2140 kcals, 133 grams protein, 1008 ml free water.   Add B complex with Vitamin C to account for losses with CRRT  NUTRITION DIAGNOSIS:   Increased nutrient needs related to other (see comment) (new dx of ESRD) as evidenced by estimated needs.  Ongoing  GOAL:   Patient will meet greater than or equal to 90% of their needs   Addressed via TF  MONITOR:  TF tolerance,Labs,I & O's,Weight trends  REASON FOR ASSESSMENT:  Rounds    ASSESSMENT:  60 yo male with a PMH of gout, osteoporosis, HTN, anemia, arthritis, and CKD stage 4 who presents with fluid overload.   3/19 - trial HD, difficulty breathing, CRRT started 3/22 - CRRT stopped 3/23 - transition to iHD, post pyloric Cortrak placed, EN initiated   Patient with hypoxia driven arrest this am. Now intubated. Requiring low dose propofol and levophed. Was able to adequately UF yesterday but will likely transition to CRRT tomorrow. Has not had BM in 6 days, recommend strengthening bowel regimen. Adjust tube feeding rate to meet needs.   EDW undetermined  Admission weight: 111.8 kg  Current weight: 102.3 kg   Patient is currently intubated on ventilator support MV: 9.4 L/min Temp (24hrs), Avg:97.2 F (36.2 C), Min:96.4 F (35.8 C), Max:98 F (36.7 C)  HD: 3000 ml yesterday HD  Drips: levophed, propofol (3.06 ml/hr) Medications: aranesp, colace, miralax Labs: Phosphorus 6.4 (H) CBG 112-154  Diet Order:   Diet Order            Diet NPO time specified  Diet effective now                EDUCATION NEEDS:  Not appropriate for education at this time  Skin:  Skin Assessment: Skin Integrity Issues: (Weeping blister on R thigh) Skin Integrity Issues:: Incisions Incisions:  Groin  Last BM:  3/18  Height:  Ht Readings from Last 1 Encounters:  01/18/2021 '5\' 9"'$  (1.753 m)   Weight:  Wt Readings from Last 1 Encounters:  01/23/2021 102.3 kg   Ideal Body Weight:  72.7 kg  BMI:  Body mass index is 33.31 kg/m.  Estimated Nutritional Needs:   Kcal:  2134   Protein:  125-145 grams   Fluid:  1000 ml + UOP (liberalize with CRRT)  Mariana Single RD, LDN Clinical Nutrition Pager listed in [place]

## 2021-01-29 NOTE — Progress Notes (Signed)
Patient's brother, Deniel Blakeman, updated on patients CODE BLUE status during ongoing CPR. Called again to update him on ROSC. Brother on his way to the hospital to accompany patient.

## 2021-01-29 NOTE — Consult Note (Signed)
[address]       [place] [address]             [phone]        Durwood M Mazzocco [facility] Medical Record [id number] Date of Birth: [birth year]  Referring: No ref. provider found Primary Care: Delrae Rend, MD Primary Cardiologist:Gayatri Stann Mainland, MD  Chief Complaint:   No chief complaint on file. s/p resp arrest, renal failure  History of Present Illness:      Asked to see 60 yo man who underwent resp arrest last night. Today, he had a bedside echo which shows tamponade physiology and a large pericardial effusion. Consult received for pericardial window. Hemodyn stable, but requiring escalating doses of vasoactive support.   Current Activity/ Functional Status: Patient was independent with mobility/ambulation, transfers, ADL's, IADL's.   Zubrod Score: At the time of surgery this patient's most appropriate activity status/level should be described as: '[]'$     0    Normal activity, no symptoms '[]'$     1    Restricted in physical strenuous activity but ambulatory, able to do out light work '[]'$     2    Ambulatory and capable of self care, unable to do work activities, up and about                 more than 50%  Of the time                            '[]'$     3    Only limited self care, in bed greater than 50% of waking hours '[]'$     4    Completely disabled, no self care, confined to bed or chair '[]'$     5    Moribund  Past Medical History:  Diagnosis Date  . Anemia    labs today indicate (09/27/11)  . Arthritis    right knee  . Bleeding ulcer    hx of, required 6 units blood  . Blood transfusion    6 units with ulcer repair surgery  . Chronic kidney disease    stage IV, has fistula, never started diaylsis  . Complication of anesthesia    little slow to wake up with last surgery  . Dyspnea    with exertion  . Gout   . Hip dislocation, left ([facility]) 10/09/2020  . Hypertension   . Osteoporosis   . Pneumonia     Past Surgical History:  Procedure  Laterality Date  . AV FISTULA PLACEMENT Left 09/07/2016   Procedure: ARTERIOVENOUS (AV) FISTULA CREATION LEFT ARM;  Surgeon: Angelia Mould, MD;  Location: [facility];  Service: Vascular;  Laterality: Left;  . CARDIOVERSION N/A 01/13/2021   Procedure: CARDIOVERSION;  Surgeon: Freada Bergeron, MD;  Location: [facility] ENDOSCOPY;  Service: Cardiovascular;  Laterality: N/A;  . COLONOSCOPY    . EXTERNAL EAR SURGERY    . HIP CLOSED REDUCTION Left 07/27/2020   Procedure: CLOSED REDUCTION HIP;  Surgeon: Marchia Bond, MD;  Location: [facility];  Service: Orthopedics;  Laterality: Left;  . HIP PINNING  11/2010  . JOINT REPLACEMENT    . OTHER SURGICAL HISTORY  ~ 08/1998   repair bleeding ulcer  . SHOULDER ARTHROSCOPY WITH ROTATOR CUFF REPAIR AND SUBACROMIAL DECOMPRESSION Right 05/26/2017   Procedure: SHOULDER ARTHROSCOPY WITH ROTATOR CUFF REPAIR AND SUBACROMIAL DECOMPRESSION;  Surgeon: Tania Ade, MD;  Location: [facility];  Service: Orthopedics;  Laterality: Right;  SHOULDER ARTHROSCOPY WITH ROTATOR CUFF REPAIR AND SUBACROMIAL DECOMPRESSION  . TOTAL HIP ARTHROPLASTY  10/06/11   left  . TOTAL HIP ARTHROPLASTY  10/06/2011   Procedure: TOTAL HIP ARTHROPLASTY;  Surgeon: Kerin Salen;  Location: [facility];  Service: Orthopedics;  Laterality: Left;    Social History   Tobacco Use  Smoking Status Current Some Day Smoker  . Packs/day: 0.25  . Years: 32.00  . Pack years: 8.00  . Types: Cigarettes  Smokeless Tobacco Former Systems developer  . Types: Chew  . Quit date: 1975  Tobacco Comment   smoking cessation consult entered    Social History   Substance and Sexual Activity  Alcohol Use Yes  . Alcohol/week: 5.0 standard drinks  . Types: 5 Cans of beer per week   Comment: occasional     Allergies  Allergen Reactions  . Penicillins Swelling, Rash and Other (See Comments)    Arms and eyes swell & skin breaks out  Has patient had a PCN reaction causing immediate rash, facial/tongue/throat swelling, SOB or  lightheadedness with hypotension: Yes Has patient had a PCN reaction causing severe rash involving mucus membranes or skin necrosis: Yes Has patient had a PCN reaction that required hospitalization: No Has patient had a PCN reaction occurring within the last 10 years: No If all of the above answers are "NO", then may proceed with Cephalosporin use.   . Atorvastatin     Muscle pain in legs  . Rosuvastatin     Muscle pain in legs    Current Facility-Administered Medications  Medication Dose Route Frequency Provider Last Rate Last Admin  . acetaminophen (TYLENOL) tablet 650 mg  650 mg Oral Q6H PRN Barb Merino, MD   650 mg at 01/23/21 [id number]   Or  . acetaminophen (TYLENOL) suppository 650 mg  650 mg Rectal Q6H PRN Barb Merino, MD      . amiodarone (PACERONE) tablet 400 mg  400 mg Per Tube BID Icard, Bradley L, DO       Followed by  . [START ON 02/05/2021] amiodarone (PACERONE) tablet 200 mg  200 mg Per Tube BID Icard, Bradley L, DO      . B-complex with vitamin C tablet 1 tablet  1 tablet Oral Daily Icard, Bradley L, DO   1 tablet at 01/25/2021 1619  . ceFEPIme (MAXIPIME) 1 g in sodium chloride 0.9 % 100 mL IVPB  1 g Intravenous Q24H Ollis, Brandi L, NP 200 mL/hr at 01/07/2021 1735 1 g at 01/23/2021 1735  . Chlorhexidine Gluconate Cloth 2 % PADS 6 each  6 each Topical Q0600 Rosita Fire, MD   6 each at 01/28/21 2250  . clindamycin (CLINDAGEL) 1 % gel   Topical BID Jillyn Ledger, PA-C   Given at 01/12/2021 [id number]  . [START ON 01/30/2021] Darbepoetin Alfa (ARANESP) injection 150 mcg  150 mcg Intravenous Q Fri-HD Gean Quint, MD      . diclofenac Sodium (VOLTAREN) 1 % topical gel 2 g  2 g Topical QID Florencia Reasons, MD   2 g at 01/19/2021 1730  . docusate (COLACE) 50 MG/5ML liquid 100 mg  100 mg Per Tube BID Gleason, Otilio Carpen, PA-C   100 mg at 01/10/2021 0947  . feeding supplement (PROSource TF) liquid 45 mL  45 mL Per Tube QID Icard, Bradley L, DO      . feeding supplement (VITAL 1.5 CAL) liquid  1,000 mL  1,000 mL Per Tube Continuous Icard, Bradley L, DO 55  mL/hr at 01/18/2021 1619 1,000 mL at 01/08/2021 1619  . fentaNYL (SUBLIMAZE) bolus via infusion 25 mcg  25 mcg Intravenous Q15 min PRN Ollis, Brandi L, NP      . fentaNYL 2563mg in NS 2570m(105mml) infusion-PREMIX  50-200 mcg/hr Intravenous Continuous MeiMaryjane HurterD 7.5 mL/hr at 01/20/2021 1500 75 mcg/hr at 01/23/2021 1500  . loratadine (CLARITIN) tablet 10 mg  10 mg Per Tube Daily Icard, Bradley L, DO   10 mg at 02/02/2021 0947  . midodrine (PROAMATINE) tablet 15 mg  15 mg Per Tube TID WC Icard, Bradley L, DO   15 mg at 02/02/2021 1622  . Muscle Rub CREA   Topical PRN Xu,Florencia ReasonsD      . norepinephrine (LEVOPHED) 16 mg in 250m34memix infusion  0-40 mcg/min Intravenous Titrated OganFrederik Pear 2.81 mL/hr at 01/24/2021 1500 3 mcg/min at 01/10/2021 1500  . pantoprazole sodium (PROTONIX) 40 mg/20 mL oral suspension 40 mg  40 mg Per Tube Daily Icard, Bradley L, DO   40 mg at 01/11/2021 0948  . polyethylene glycol (MIRALAX / GLYCOLAX) packet 17 g  17 g Per Tube Daily Gleason, LaurOtilio Carpen-C   17 g at 02/04/2021 0948  . polyethylene glycol (MIRALAX / GLYCOLAX) packet 17 g  17 g Per Tube BID PRN Icard, Bradley L, DO      . propofol (DIPRIVAN) 1000 MG/100ML infusion  0-30 mcg/kg/min Intravenous Continuous Ollis, Brandi L, NP 3.06 mL/hr at 01/22/2021 1500 5 mcg/kg/min at 02/03/2021 1500  . sodium chloride flush (NS) 0.9 % injection 10-40 mL  10-40 mL Intracatheter Q12H Icard, Bradley L, DO   10 mL at 01/18/2021 0949  . sodium chloride flush (NS) 0.9 % injection 10-40 mL  10-40 mL Intracatheter PRN Icard, Bradley L, DO      . traMADol (ULTRAM) tablet 50 mg  50 mg Per Tube Q6H PRN Icard, Bradley L, DO      . vancomycin variable dose per unstable renal function (pharmacist dosing)   Does not apply See admin instructions Icard, Bradley L, DO        Medications Prior to Admission  Medication Sig Dispense Refill Last Dose  . apixaban (ELIQUIS) 5 MG TABS  tablet Take 1 tablet (5 mg total) by mouth 2 (two) times daily. 60 tablet 0 01/22/2021 at Unknown time  . ferrous sulfate 325 (65 FE) MG tablet Take 1 tablet (325 mg total) by mouth daily with breakfast. 30 tablet 0 02/02/2021 at Unknown time  . furosemide (LASIX) 80 MG tablet Take 1 tablet (80 mg total) by mouth 2 (two) times daily. 60 tablet 1 01/17/2021 at Unknown time  . Ketotifen Fumarate (ALLERGY EYE DROPS OP) Place 1 drop into both eyes daily as needed (allergies).   02/03/2021 at Unknown time  . loratadine (CLARITIN) 10 MG tablet Take 10 mg by mouth daily.   01/08/2021 at Unknown time  . Menthol-Zinc Oxide (GOLD BOND EX) Apply 1 application topically daily as needed (itching).   Past Week at Unknown time  . metoprolol succinate (TOPROL-XL) 25 MG 24 hr tablet Take 12.5 mg by mouth daily.   02/05/2021 at Unknown time  . midodrine (PROAMATINE) 5 MG tablet Take 3 tablets (15 mg total) by mouth 3 (three) times daily with meals. 180 tablet 1 01/20/2021 at Unknown time  . Multiple Vitamin (MULTIVITAMIN WITH MINERALS) TABS tablet Take 1 tablet by mouth daily after breakfast.   02/03/2021 at Unknown time  . polyethylene glycol (MIRALAX /  GLYCOLAX) 17 g packet Take 17 g by mouth 2 (two) times daily as needed for mild constipation. 14 each 0 01/07/2021 at Unknown time  . sevelamer carbonate (RENVELA) 800 MG tablet Take 1 tablet (800 mg total) by mouth 3 (three) times daily with meals. 90 tablet 0 01/12/2021 at Unknown time  . tamsulosin (FLOMAX) 0.4 MG CAPS capsule Take 1 capsule (0.4 mg total) by mouth daily. 30 capsule 1 01/11/2021 at Unknown time  . vitamin B-12 (CYANOCOBALAMIN) 1000 MCG tablet Take 1,000 mcg by mouth daily.   01/22/2021 at Unknown time    Family History  Problem Relation Age of Onset  . Cancer Mother   . Atrial fibrillation Mother   . Hypertension Mother   . Cancer Father      Review of Systems:   ROS Review of systems not obtained due to patient factors.     Cardiac Review of  Systems: Y or  [    ]= no  Chest Pain [    ]  Resting SOB [   ] Exertional SOB  [  ]  Orthopnea [  ]   Pedal Edema [   ]    Palpitations [  ] Syncope  [  ]   Presyncope [   ]  General Review of Systems: [Y] = yes [  ]=no Constitional: recent weight change [  ]; anorexia [  ]; fatigue [  ]; nausea [  ]; night sweats [  ]; fever [  ]; or chills [  ]                                                               Dental: Last Dentist visit:   Eye : blurred vision [  ]; diplopia [   ]; vision changes [  ];  Amaurosis fugax[  ]; Resp: cough [  ];  wheezing[  ];  hemoptysis[  ]; shortness of breath[  ]; paroxysmal nocturnal dyspnea[  ]; dyspnea on exertion[  ]; or orthopnea[  ];  GI:  gallstones[  ], vomiting[  ];  dysphagia[  ]; melena[  ];  hematochezia [  ]; heartburn[  ];   Hx of  Colonoscopy[  ]; GU: kidney stones [  ]; hematuria[  ];   dysuria [  ];  nocturia[  ];  history of     obstruction [  ]; urinary frequency [  ]             Skin: rash, swelling[  ];, hair loss[  ];  peripheral edema[  ];  or itching[  ]; Musculosketetal: myalgias[  ];  joint swelling[  ];  joint erythema[  ];  joint pain[  ];  back pain[  ];  Heme/Lymph: bruising[  ];  bleeding[  ];  anemia[  ];  Neuro: TIA[  ];  headaches[  ];  stroke[  ];  vertigo[  ];  seizures[  ];   paresthesias[  ];  difficulty walking[  ];  Psych:depression[  ]; anxiety[  ];  Endocrine: diabetes[  ];  thyroid dysfunction[  ];            Physical Exam: BP (!) 90/56   Pulse (!) 46   Temp (!) 97 F (36.1 C) (Axillary)  Resp 18   Ht '5\' 9"'$  (1.753 m)   Wt 102.3 kg   SpO2 100%   BMI 33.31 kg/m   Intubated/sedated Distant heart tones Diminished BS at bases Neuro: unable to assess  Diagnostic Studies & Laboratory data:     Recent Radiology Findings:   DG CHEST PORT 1 VIEW  Result Date: 01/31/2021 CLINICAL DATA:  Intubation.  Pulmonary edema. EXAM: PORTABLE CHEST 1 VIEW COMPARISON:  01/09/2021. FINDINGS: Endotracheal tube, feeding tube,  right IJ line stable position. Severe cardiomegaly again noted. Low lung volumes with persistent prominent bibasilar atelectasis. Mild infiltrate left mid lung cannot be excluded on today's exam. No prominent pleural effusion. No pneumothorax. IMPRESSION: 1.  Lines and tubes in stable position. 2.  Severe cardiomegaly again noted. 3. Low lung volumes with persistent prominent bibasilar atelectasis. Mild infiltrate left mid lung cannot be excluded on today's exam. Electronically Signed   By: Marcello Moores  Register   On: 01/11/2021 07:12   DG CHEST PORT 1 VIEW  Result Date: 01/11/2021 CLINICAL DATA:  Respiratory failure EXAM: PORTABLE CHEST 1 VIEW COMPARISON:  Radiograph 01/28/2021, CT 11/30/2019 FINDINGS: Endotracheal tube terminates in the mid trachea, 4 cm from the carina. Transesophageal tube tip terminates below the margins of imaging, beyond the GE junction. Right IJ catheter tip terminates in the mid SVC. Redemonstration of the extreme cardiomegaly. Some increasing retrocardiac opacity bilaterally likely reflecting worsening volume loss/atelectatic changes of the lower lobes with some mild pulmonary vascular congestion and hazy interstitial opacity suggestive of some edematous change. No pneumothorax. Partial obscuration of the hemidiaphragms may be related to atelectasis or layering fluid. No acute osseous or other soft tissue abnormality. IMPRESSION: 1. Lines and tubes as above. 2. Increasing retrocardiac opacity in the medial lung bases likely reflecting worsening volume loss/atelectasis in lower lobes. 3. Persisting hazy opacities and vascular congestion suggesting at least mild interstitial edema. Possible effusions. Electronically Signed   By: Lovena Le M.D.   On: 01/13/2021 01:33   DG CHEST PORT 1 VIEW  Result Date: 01/28/2021 CLINICAL DATA:  Pulmonary edema, shortness of breath EXAM: PORTABLE CHEST 1 VIEW COMPARISON:  Radiograph 01/17/2021 FINDINGS: Right IJ approach central venous catheter tip  terminates in the mid to upper SVC. Extreme cardiomegaly is similar to priors. Persistent pulmonary vascular congestion. Increasing hazy and patchy opacities are seen in the left mid to lower lung with more confluent opacity in the right retrocardiac space as well. No pneumothorax. Obscuration of the bilateral hemidiaphragms may reflect some layering bilateral effusions though there is some partial collimation peripherally. No acute osseous or soft tissue abnormality. IMPRESSION: Worsening hazy and patchy opacities in the left mid to lower lung with more confluent opacity in the right retrocardiac space. Findings are most compatible with worsening pulmonary edema. Electronically Signed   By: Lovena Le M.D.   On: 01/28/2021 06:35   DG Abd Portable 1V  Result Date: 01/28/2021 CLINICAL DATA:  Feeding tube placement. EXAM: PORTABLE ABDOMEN - 1 VIEW COMPARISON:  None. FINDINGS: The bowel gas pattern is normal. Distal tip of Dobbhoff feeding tube is seen in expected portion of second portion of duodenum. No radio-opaque calculi or other significant radiographic abnormality are seen. IMPRESSION: Distal tip of Dobbhoff feeding tube is seen in expected portion of second portion of duodenum. No evidence of bowel obstruction or ileus. Electronically Signed   By: Marijo Conception M.D.   On: 01/28/2021 15:06   ECHOCARDIOGRAM COMPLETE  Result Date: 02/05/2021    ECHOCARDIOGRAM REPORT   Patient  Name:   Mark [name] Date of Exam: 01/22/2021 Medical Rec #:  [id number]        Height:       69.0 in Accession #:    [id number]       Weight:       225.5 lb Date of Birth:  Jul 08, 1961        BSA:          2.174 m Patient Age:    77 years         BP:           85/61 mmHg Patient Gender: M                HR:           69 bpm. Exam Location:  Inpatient Procedure: 2D Echo, Cardiac Doppler and Color Doppler Indications:    I31.3 Pericardial effusion  History:        Patient has prior history of Echocardiogram examinations, most                  recent 10/09/2020. Signs/Symptoms:Dyspnea; Risk                 Factors:Hypertension. CKD.  Sonographer:    Jonelle Sidle Dance Referring Phys: [id number] Francesca Jewett  Sonographer Comments: Echo performed with patient supine and on artificial respirator and patient is morbidly obese. Image acquisition challenging due to patient body habitus. IMPRESSIONS  1. There is large circumferential pericardial effusion with signs of hemodynamic compromise, a pericardiocentesis is recommended. The attending physician Dr Harrell Gave was notified.  2. Left ventricular ejection fraction, by estimation, is 60 to 65%. The left ventricle has normal function. The left ventricle has no regional wall motion abnormalities. Left ventricular diastolic function could not be evaluated.  3. Right ventricular systolic function is normal. The right ventricular size is underfilled.  4. Left atrial size was mildly dilated.  5. Large pericardial effusion. The pericardial effusion is circumferential. Findings are consistent with cardiac tamponade.  6. The mitral valve is normal in structure. Mild mitral valve regurgitation. No evidence of mitral stenosis.  7. The aortic valve is normal in structure. There is severe calcifcation of the aortic valve. There is severe thickening of the aortic valve. Aortic valve regurgitation is mild. Moderate aortic valve stenosis. Aortic valve mean gradient measures 28.0 mmHg.  8. There is borderline dilatation of the ascending aorta, measuring 40 mm.  9. The inferior vena cava is dilated in size with <50% respiratory variability, suggesting right atrial pressure of 15 mmHg. FINDINGS  Left Ventricle: Left ventricular ejection fraction, by estimation, is 60 to 65%. The left ventricle has normal function. The left ventricle has no regional wall motion abnormalities. The left ventricular internal cavity size was normal in size. There is  no left ventricular hypertrophy. Left ventricular diastolic function could not  be evaluated due to atrial fibrillation. Left ventricular diastolic function could not be evaluated. Right Ventricle: The right ventricular size is underfilled. No increase in right ventricular wall thickness. Right ventricular systolic function is normal. Left Atrium: Left atrial size was mildly dilated. Right Atrium: Right atrial size was normal in size. Pericardium: A large pericardial effusion is present. The pericardial effusion is circumferential. There is evidence of cardiac tamponade. Mitral Valve: The mitral valve is normal in structure. There is moderate thickening of the mitral valve leaflet(s). There is moderate calcification of the mitral valve leaflet(s). Mild mitral valve regurgitation. No evidence of mitral valve stenosis. Tricuspid  Valve: The tricuspid valve is normal in structure. Tricuspid valve regurgitation is not demonstrated. No evidence of tricuspid stenosis. Aortic Valve: The aortic valve is normal in structure. There is severe calcifcation of the aortic valve. There is severe thickening of the aortic valve. Aortic valve regurgitation is mild. Aortic regurgitation PHT measures 333 msec. Moderate aortic stenosis is present. Aortic valve mean gradient measures 28.0 mmHg. Aortic valve peak gradient measures 44.6 mmHg. Aortic valve area, by VTI measures 0.99 cm. Pulmonic Valve: The pulmonic valve was normal in structure. Pulmonic valve regurgitation is not visualized. No evidence of pulmonic stenosis. Aorta: The aortic root is normal in size and structure. There is borderline dilatation of the ascending aorta, measuring 40 mm. Venous: The inferior vena cava is dilated in size with less than 50% respiratory variability, suggesting right atrial pressure of 15 mmHg. IAS/Shunts: No atrial level shunt detected by color flow Doppler.  LEFT VENTRICLE PLAX 2D LVIDd:         4.90 cm LVIDs:         3.40 cm LV PW:         1.00 cm LV IVS:        0.90 cm LVOT diam:     1.90 cm LV SV:         57 LV SV Index:    26 LVOT Area:     2.84 cm  RIGHT VENTRICLE          IVC RV Basal diam:  3.30 cm  IVC diam: 3.00 cm TAPSE (M-mode): 2.5 cm LEFT ATRIUM              Index       RIGHT ATRIUM           Index LA diam:        4.20 cm  1.93 cm/m  RA Area:     23.50 cm LA Vol (A2C):   156.0 ml 71.75 ml/m RA Volume:   70.40 ml  32.38 ml/m LA Vol (A4C):   119.0 ml 54.73 ml/m LA Biplane Vol: 139.0 ml 63.93 ml/m  AORTIC VALVE AV Area (Vmax):    0.87 cm AV Area (Vmean):   0.95 cm AV Area (VTI):     0.99 cm AV Vmax:           334.00 cm/s AV Vmean:          227.000 cm/s AV VTI:            0.576 m AV Peak Grad:      44.6 mmHg AV Mean Grad:      28.0 mmHg LVOT Vmax:         103.05 cm/s LVOT Vmean:        76.000 cm/s LVOT VTI:          0.201 m LVOT/AV VTI ratio: 0.35 AI PHT:            333 msec  AORTA Ao Root diam: 4.00 cm Ao Asc diam:  4.00 cm MITRAL VALVE MV Area (PHT): 3.12 cm     SHUNTS MV Decel Time: 243 msec     Systemic VTI:  0.20 m MV E velocity: 116.00 cm/s  Systemic Diam: 1.90 cm MV A velocity: 69.90 cm/s MV E/A ratio:  1.66 Ena Dawley MD Electronically signed by Ena Dawley MD Signature Date/Time: 02/05/2021/4:45:35 PM    Final      I have independently reviewed the above radiologic studies and discussed with the patient   Recent  Lab Findings: Lab Results  Component Value Date   WBC 25.2 (H) 01/30/2021   HGB 7.5 (L) 01/15/2021   HCT 23.2 (L) 01/06/2021   PLT 240 02/01/2021   GLUCOSE 146 (H) 01/19/2021   ALT 17 01/23/2021   AST 35 01/07/2021   NA 135 01/15/2021   K 4.2 02/01/2021   CL 98 01/13/2021   CREATININE 3.88 (H) 01/18/2021   BUN 75 (H) 01/28/2021   CO2 25 01/20/2021   TSH 1.804 10/08/2020   INR 2.7 (H) 01/22/2021   HGBA1C 5.5 10/11/2020      Assessment / Plan:     To OR for urgent pericardial window     I  spent 20 minutes counseling the patient face to face.   Kache Mcclurg Z. Orvan Seen, MD [phone] 01/17/2021 6:00 PM

## 2021-01-29 NOTE — Progress Notes (Signed)
Patient disconnected self from vent with saturation in the mid 40's. Patient bagged with improvement of saturations to 100%. All other vitals stable, RT will continue to monitor patient.

## 2021-01-29 NOTE — Progress Notes (Signed)
At approx 0106, patient pulled off Bipap mask and was found by RT to be bradycardic, hypoxic, and pulseless while primary RN was involved in patient care in another room. Code blue called, Elink called, compressions started at [place]. Epi given 2x. ROSC at 0112. Bicarb given twice at 0113. Pt intubated at [place].  Ground team came to bedside and placed Art line + CVC femoral after pt became hypotensive.  Pt's brother now at bedside.

## 2021-01-29 NOTE — Progress Notes (Signed)
Patient disconnected himself from the vent, desat to 41s. Bagged pt to 100% sat. Vitals stable. Elink notified and received soft bilateral restraints order. Family at bedside and aware of restraints.

## 2021-01-29 NOTE — Progress Notes (Signed)
eLink Physician-Brief Progress Note Patient Name: Mark [name] DOB: 12-Sep-1961 MRN: [id number]   Date of Service  01/15/2021  HPI/Events of Note  Patient needs bilateral wrist restraints to prevent self-extubation.  eICU Interventions  Bilateral restraints ordered.        [name] 01/25/2021, 6:56 AM

## 2021-01-29 NOTE — Anesthesia Preprocedure Evaluation (Addendum)
Anesthesia Evaluation  Patient identified by MRN, date of birth, ID band Patient awake    Reviewed: Allergy & Precautions, H&P , NPO status , Patient's Chart, lab work & pertinent test results  History of Anesthesia Complications (+) history of anesthetic complications  Airway Mallampati: Intubated   Neck ROM: full    Dental  (+) Implants   Pulmonary shortness of breath, pneumonia, Current Smoker and Patient abstained from smoking.,    breath sounds clear to auscultation       Cardiovascular hypertension, + dysrhythmias Atrial Fibrillation  Rhythm:irregular Rate:Normal  Echo 01/13/2021 1. There is large circumferential pericardial effusion with signs of hemodynamic compromise, a pericardiocentesis is recommended. The attending physician Dr Harrell Gave was notified.  2. Left ventricular ejection fraction, by estimation, is 60 to 65%. The left ventricle has normal function. The left ventricle has no regional wall motion abnormalities. Left ventricular diastolic function could not be evaluated.  3. Right ventricular systolic function is normal. The right ventricular size is underfilled.  4. Left atrial size was mildly dilated.  5. Large pericardial effusion. The pericardial effusion is  circumferential. Findings are consistent with cardiac tamponade.  6. The mitral valve is normal in structure. Mild mitral valve  regurgitation. No evidence of mitral stenosis.  7. The aortic valve is normal in structure. There is severe calcifcation of the aortic valve. There is severe thickening of the aortic valve. Aortic valve regurgitation is mild. Moderate aortic valve stenosis. Aortic valve mean gradient measures 28.0 mmHg.  8. There is borderline dilatation of the ascending aorta, measuring 40 mm.  9. The inferior vena cava is dilated in size with <50% respiratory variability, suggesting right atrial pressure of 15 mmHg.     Neuro/Psych     GI/Hepatic PUD,   Endo/Other  diabetes, Type 2obese  Renal/GU Renal InsufficiencyRenal disease     Musculoskeletal  (+) Arthritis ,   Abdominal   Peds  Hematology  (+) Blood dyscrasia, anemia ,   Anesthesia Other Findings   Reproductive/Obstetrics                            Anesthesia Physical  Anesthesia Plan  ASA: IV and emergent  Anesthesia Plan: General   Post-op Pain Management:    Induction: Intravenous and Rapid sequence  PONV Risk Score and Plan: 2 and Treatment may vary due to age or medical condition, Ondansetron, Dexamethasone and Midazolam  Airway Management Planned: Oral ETT  Additional Equipment: Arterial line  Intra-op Plan:   Post-operative Plan: Post-operative intubation/ventilation  Informed Consent: I have reviewed the patients History and Physical, chart, labs and discussed the procedure including the risks, benefits and alternatives for the proposed anesthesia with the patient or authorized representative who has indicated his/her understanding and acceptance.     Dental advisory given  Plan Discussed with: CRNA  Anesthesia Plan Comments:        Anesthesia Quick Evaluation

## 2021-01-30 DIAGNOSIS — I5033 Acute on chronic diastolic (congestive) heart failure: Secondary | ICD-10-CM | POA: Diagnosis not present

## 2021-01-30 DIAGNOSIS — I469 Cardiac arrest, cause unspecified: Secondary | ICD-10-CM | POA: Diagnosis not present

## 2021-01-30 DIAGNOSIS — I484 Atypical atrial flutter: Secondary | ICD-10-CM | POA: Diagnosis not present

## 2021-01-30 DIAGNOSIS — R0603 Acute respiratory distress: Secondary | ICD-10-CM | POA: Diagnosis not present

## 2021-01-30 DIAGNOSIS — E8779 Other fluid overload: Secondary | ICD-10-CM | POA: Diagnosis not present

## 2021-01-30 LAB — RENAL FUNCTION PANEL
Albumin: 2 g/dL — ABNORMAL LOW (ref 3.5–5.0)
Albumin: 1.8 g/dL — ABNORMAL LOW (ref 3.5–5.0)
Anion gap: 11 (ref 5–15)
Anion gap: 6 (ref 5–15)
BUN: 88 mg/dL — ABNORMAL HIGH (ref 8–23)
BUN: 78 mg/dL — ABNORMAL HIGH (ref 8–23)
CO2: 25 mmol/L (ref 22–32)
CO2: 25 mmol/L (ref 22–32)
Calcium: 12 mg/dL — ABNORMAL HIGH (ref 8.9–10.3)
Calcium: 10.9 mg/dL — ABNORMAL HIGH (ref 8.9–10.3)
Chloride: 98 mmol/L (ref 98–111)
Chloride: 103 mmol/L (ref 98–111)
Creatinine, Ser: 4.37 mg/dL — ABNORMAL HIGH (ref 0.61–1.24)
Creatinine, Ser: 3.72 mg/dL — ABNORMAL HIGH (ref 0.61–1.24)
GFR, Estimated: 15 mL/min — ABNORMAL LOW (ref >60)
GFR, Estimated: 18 mL/min — ABNORMAL LOW (ref >60)
Glucose, Bld: 186 mg/dL — ABNORMAL HIGH (ref 70–99)
Glucose, Bld: 209 mg/dL — ABNORMAL HIGH (ref 70–99)
Phosphorus: 6.5 mg/dL — ABNORMAL HIGH (ref 2.5–4.6)
Phosphorus: 5.4 mg/dL — ABNORMAL HIGH (ref 2.5–4.6)
Potassium: 4.4 mmol/L (ref 3.5–5.1)
Potassium: 4.2 mmol/L (ref 3.5–5.1)
Sodium: 134 mmol/L — ABNORMAL LOW (ref 135–145)
Sodium: 134 mmol/L — ABNORMAL LOW (ref 135–145)

## 2021-01-30 LAB — CBC
HCT: 23.3 % — ABNORMAL LOW (ref 39.0–52.0)
Hemoglobin: 7.6 g/dL — ABNORMAL LOW (ref 13.0–17.0)
MCH: 32.1 pg (ref 26.0–34.0)
MCHC: 32.6 g/dL (ref 30.0–36.0)
MCV: 98.3 fL (ref 80.0–100.0)
Platelets: 249 10*3/uL (ref 150–400)
RBC: 2.37 MIL/uL — ABNORMAL LOW (ref 4.22–5.81)
RDW: 17.9 % — ABNORMAL HIGH (ref 11.5–15.5)
WBC: 26.2 10*3/uL — ABNORMAL HIGH (ref 4.0–10.5)
nRBC: 4.8 % — ABNORMAL HIGH (ref 0.0–0.2)

## 2021-01-30 LAB — GLUCOSE, CAPILLARY
Glucose-Capillary: 166 mg/dL — ABNORMAL HIGH (ref 70–99)
Glucose-Capillary: 171 mg/dL — ABNORMAL HIGH (ref 70–99)
Glucose-Capillary: 172 mg/dL — ABNORMAL HIGH (ref 70–99)
Glucose-Capillary: 182 mg/dL — ABNORMAL HIGH (ref 70–99)
Glucose-Capillary: 193 mg/dL — ABNORMAL HIGH (ref 70–99)
Glucose-Capillary: 201 mg/dL — ABNORMAL HIGH (ref 70–99)

## 2021-01-30 LAB — VANCOMYCIN, RANDOM: Vancomycin Rm: 21

## 2021-01-30 LAB — TRIGLYCERIDES: Triglycerides: 109 mg/dL (ref <150)

## 2021-01-30 LAB — MAGNESIUM: Magnesium: 2.3 mg/dL (ref 1.7–2.4)

## 2021-01-30 MED ORDER — HEPARIN (PORCINE) 25000 UT/250ML-% IV SOLN
1900.0000 [IU]/h | INTRAVENOUS | Status: DC
  Administered 2021-01-30 – 2021-02-03 (×6): 1650 [IU]/h via INTRAVENOUS
  Administered 2021-02-03 – 2021-02-04 (×3): 1900 [IU]/h via INTRAVENOUS
  Administered 2021-02-05 (×2): 2000 [IU]/h via INTRAVENOUS
  Filled 2021-01-30 (×11): qty 250

## 2021-01-30 MED ORDER — PRISMASOL BGK 0/2.5 32-2.5 MEQ/L EC SOLN
Status: DC
  Filled 2021-01-30 (×12): qty 5000

## 2021-01-30 MED ORDER — VANCOMYCIN HCL 1000 MG/200ML IV SOLN
1000.0000 mg | INTRAVENOUS | Status: DC
  Administered 2021-01-30 – 2021-02-01 (×3): 1000 mg via INTRAVENOUS
  Filled 2021-01-30 (×3): qty 200

## 2021-01-30 MED ORDER — SODIUM CHLORIDE 0.9 % IV SOLN
2.0000 g | Freq: Two times a day (BID) | INTRAVENOUS | Status: AC
  Administered 2021-01-30 – 2021-02-01 (×5): 2 g via INTRAVENOUS
  Filled 2021-01-30 (×5): qty 2

## 2021-01-30 MED ORDER — SODIUM CHLORIDE 0.9 % IV SOLN
1.0000 g | Freq: Two times a day (BID) | INTRAVENOUS | Status: DC
  Filled 2021-01-30: qty 1

## 2021-01-30 MED ORDER — AMIODARONE HCL 200 MG PO TABS
200.0000 mg | ORAL_TABLET | Freq: Two times a day (BID) | ORAL | Status: DC
  Administered 2021-01-30 – 2021-02-03 (×8): 200 mg
  Filled 2021-01-30 (×8): qty 1

## 2021-01-30 MED ORDER — PRISMASOL BGK 0/2.5 32-2.5 MEQ/L EC SOLN
Status: DC
  Filled 2021-01-30 (×34): qty 5000

## 2021-01-30 MED ORDER — PRISMASOL BGK 4/2.5 32-4-2.5 MEQ/L REPLACEMENT SOLN: Status: DC

## 2021-01-30 MED ORDER — INSULIN ASPART 100 UNIT/ML ~~LOC~~ SOLN
1.0000 [IU] | SUBCUTANEOUS | Status: DC
  Administered 2021-01-30 (×6): 2 [IU] via SUBCUTANEOUS
  Administered 2021-01-30: 3 [IU] via SUBCUTANEOUS
  Administered 2021-01-31: 2 [IU] via SUBCUTANEOUS
  Administered 2021-01-31: 3 [IU] via SUBCUTANEOUS
  Administered 2021-01-31 – 2021-02-02 (×9): 2 [IU] via SUBCUTANEOUS
  Administered 2021-02-02 (×2): 3 [IU] via SUBCUTANEOUS
  Administered 2021-02-02 (×4): 2 [IU] via SUBCUTANEOUS
  Administered 2021-02-03: 1 [IU] via SUBCUTANEOUS
  Administered 2021-02-03 – 2021-02-04 (×5): 2 [IU] via SUBCUTANEOUS
  Administered 2021-02-04: 1 [IU] via SUBCUTANEOUS
  Administered 2021-02-04: 2 [IU] via SUBCUTANEOUS

## 2021-01-30 NOTE — Plan of Care (Signed)
  Problem: Clinical Measurements: Goal: Cardiovascular complication will be avoided Outcome: Progressing   Problem: Coping: Goal: Level of anxiety will decrease Outcome: Progressing   Problem: Elimination: Goal: Will not experience complications related to bowel motility Outcome: Progressing   Problem: Elimination: Goal: Will not experience complications related to urinary retention Outcome: Progressing   Problem: Skin Integrity: Goal: Risk for impaired skin integrity will decrease Outcome: Progressing

## 2021-01-30 NOTE — Progress Notes (Signed)
[facility] NEPHROLOGY PROGRESS NOTE  Assessment/ Plan: Pt is a 60 y.o. yo male  With PMH of HTN, DM, HLD, obesity, CKD stage V baseline creatinine level around 5 admitted with acute diastolic CHF.  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  Started HD 3/19 however around 45 minutes into the treatment patient developed chest pain, tachycardia and worsening hypoxia.  Rapid response called and later patient was transferred to ICU.  He is grossly volume overload, systolic blood pressure around 90s and req'ed BiPAP.  Unable to ultrafiltrate with IHD therefore started CRRT on 3/19 mainly for ultrafiltration and azotemia. CRRT completed on 3/22. S/P IHD 3/23 3L UF -restart CRRT today via rij temp line. Agree with pall care -He has a left radiocephalic fistula that was created in October, 2017 by Dr. Scot Dock that is mature, able to use for IHD.  Cardiac arrest, PEA 3/24 -one round of cpr and epi. Possibly respiratory/hypoxia driven  Shock -pressor support per CCM, on midodrine  Acute exacerbation of diastolic heart failure:  Started CRRT mainly for ultrafiltration. UF with HD, see above  Pericardial effusion w/ tamponade -s/p pericardial window with thoracotomy 3/24   Anemia of chronic disease: Iron deficiency therefore ordered IV iron and ESA on 3/17.  Continue ESA.  Transfuse for hgb <7  Secondary hyperparathyroidism: Both calcium and phosphorus level elevated.  Discontinued MVA and hold any vitamin D.  Increased Auryxia yesterday but currently n.p.o.  Now expect to improve with RRT.  Aflutter -cardiology following. Off a/c for now  Recommendations conveyed to primary service.  Subjective: Seen and examined in ICU. S/p pericardial window last night. Increasing levo requirements.  Objective Vital signs in last 24 hours: Vitals:   01/30/21 0743 01/30/21 0800 01/30/21 0900 01/30/21 1116  BP: (!) 104/43 99/60 (!) 86/55 (!) 122/58   Pulse: 99 94 (!) 108 87  Resp: 18 19 (!) 0 20  Temp: 98.8 F (37.1 C)   99.1 F (37.3 C)  TempSrc:  Oral    SpO2: 100% 100% 100% 100%  Weight:      Height:       Weight change: -2 kg  Intake/Output Summary (Last 24 hours) at 01/30/2021 1140 Last data filed at 01/30/2021 1138 Gross per 24 hour  Intake 1845.92 ml  Output 648 ml  Net 1197.92 ml       Labs: Basic Metabolic Panel: Recent Labs  Lab 01/23/2021 0515 02/05/2021 1631 02/01/2021 2003 01/30/21 0403  NA 135 135 134* 134*  K 4.3 4.2 4.1 4.4  CL 99 98 99 98  CO2 26 25 25 25   GLUCOSE 154* 146* 129* 186*  BUN 65* 75* 79* 88*  CREATININE 3.51* 3.88* 3.84* 4.37*  CALCIUM 11.8* 12.0* 11.9* 12.0*  PHOS 6.4* 5.7*  --  6.5*   Liver Function Tests: Recent Labs  Lab 01/24/21 0303 01/25/21 1541 02/05/2021 0122 01/23/2021 0515 01/08/2021 1631 01/30/21 0403  AST 22  --  35  --   --   --   ALT 15  --  17  --   --   --   ALKPHOS 68  --  85  --   --   --   BILITOT 1.2  --  0.9  --   --   --   PROT 7.6  --  7.8  --   --   --   ALBUMIN 2.4*   < > 2.4* 2.2* 2.1* 2.0*   < > =  values in this interval not displayed.   No results for input(s): LIPASE, AMYLASE in the last 168 hours. No results for input(s): AMMONIA in the last 168 hours. CBC: Recent Labs  Lab 01/24/21 0303 01/24/21 2254 01/25/21 0132 01/25/2021 0430 01/23/2021 0808 01/28/21 0420 01/28/21 1554 02/01/2021 0122 02/03/2021 0359 02/04/2021 0515 01/10/2021 2003 01/30/21 0403  WBC 18.6*   < > 20.3* 21.3*   < > 22.3*  --  31.0*  --  25.2* 22.1* 26.2*  NEUTROABS 15.8*  --  17.0* 18.4*  --   --   --   --   --   --   --   --   HGB 7.3*   < > 7.1* 7.1*   < > 7.1*   < > 8.5*   < > 7.5* 7.3* 7.6*  HCT 23.2*   < > 23.2* 22.3*   < > 22.9*   < > 26.5*   < > 23.2* 22.0* 23.3*  MCV 100.4*   < > 100.9* 100.0   < > 100.9*  --  99.3  --  97.5 97.3 98.3  PLT 256   < > 254 278   < > 220  --  239  --  240 235 249   < > = values in this interval not displayed.   Cardiac Enzymes: Recent  Labs  Lab 01/24/21 0303  CKTOTAL 22*   CBG: Recent Labs  Lab 01/11/2021 2056 01/28/2021 2359 01/30/21 0514 01/30/21 0747 01/30/21 1126  GLUCAP 115* 193* 172* 201* 171*    Iron Studies:  No results for input(s): IRON, TIBC, TRANSFERRIN, FERRITIN in the last 72 hours. Studies/Results: DG Chest 1 View  Result Date: 01/15/2021 CLINICAL DATA:  Hypoxia EXAM: CHEST  1 VIEW COMPARISON:  January 29, 2021 study obtained earlier in the day FINDINGS: Endotracheal tube tip is 4.3 cm above the carina. Central catheter tip is in the superior vena cava. Enteric tube tip is below the diaphragm. There is a chest tube on the left. There is a left apical pneumothorax without tension component. There is persistent airspace consolidation throughout portions of the right mid and lower lung regions, stable. Atelectatic changes are noted in the left mid lung and left base regions. There is stable cardiomegaly with pulmonary vascularity within normal limits. No adenopathy evident. No bone lesions. IMPRESSION: Tube and catheter positions as described. Chest tube present on the left small left apical pneumothorax. No tension component. Consolidation in portions of the right mid and lower lung regions remains. Atelectatic change in the left mid and lower lung regions present. Stable cardiomegaly. Electronically Signed   By: Lowella Grip III M.D.   On: 01/13/2021 20:27   DG Abd 1 View  Result Date: 01/27/2021 CLINICAL DATA:  Feeding tube placement EXAM: ABDOMEN - 1 VIEW COMPARISON:  None. FINDINGS: Feeding tube tip is at the level of the first portion of the duodenum. There is moderate air in the stomach. There is moderate stool in the colon. There is no bowel dilatation or air-fluid level in visualized abdomen region. No free air evident on supine examination. IMPRESSION: Feeding tube tip at first portion of duodenum. No overt bowel obstruction or free air evident on somewhat limited supine examination. Electronically  Signed   By: Lowella Grip III M.D.   On: 01/17/2021 20:28   DG CHEST PORT 1 VIEW  Result Date: 01/13/2021 CLINICAL DATA:  Intubation.  Pulmonary edema. EXAM: PORTABLE CHEST 1 VIEW COMPARISON:  01/25/2021. FINDINGS: Endotracheal tube, feeding tube,  right IJ line stable position. Severe cardiomegaly again noted. Low lung volumes with persistent prominent bibasilar atelectasis. Mild infiltrate left mid lung cannot be excluded on today's exam. No prominent pleural effusion. No pneumothorax. IMPRESSION: 1.  Lines and tubes in stable position. 2.  Severe cardiomegaly again noted. 3. Low lung volumes with persistent prominent bibasilar atelectasis. Mild infiltrate left mid lung cannot be excluded on today's exam. Electronically Signed   By: Marcello Moores  Register   On: 01/22/2021 07:12   DG CHEST PORT 1 VIEW  Result Date: 02/02/2021 CLINICAL DATA:  Respiratory failure EXAM: PORTABLE CHEST 1 VIEW COMPARISON:  Radiograph 01/28/2021, CT 11/30/2019 FINDINGS: Endotracheal tube terminates in the mid trachea, 4 cm from the carina. Transesophageal tube tip terminates below the margins of imaging, beyond the GE junction. Right IJ catheter tip terminates in the mid SVC. Redemonstration of the extreme cardiomegaly. Some increasing retrocardiac opacity bilaterally likely reflecting worsening volume loss/atelectatic changes of the lower lobes with some mild pulmonary vascular congestion and hazy interstitial opacity suggestive of some edematous change. No pneumothorax. Partial obscuration of the hemidiaphragms may be related to atelectasis or layering fluid. No acute osseous or other soft tissue abnormality. IMPRESSION: 1. Lines and tubes as above. 2. Increasing retrocardiac opacity in the medial lung bases likely reflecting worsening volume loss/atelectasis in lower lobes. 3. Persisting hazy opacities and vascular congestion suggesting at least mild interstitial edema. Possible effusions. Electronically Signed   By: Lovena Le M.D.   On: 01/27/2021 01:33   DG Abd Portable 1V  Result Date: 01/28/2021 CLINICAL DATA:  Feeding tube placement. EXAM: PORTABLE ABDOMEN - 1 VIEW COMPARISON:  None. FINDINGS: The bowel gas pattern is normal. Distal tip of Dobbhoff feeding tube is seen in expected portion of second portion of duodenum. No radio-opaque calculi or other significant radiographic abnormality are seen. IMPRESSION: Distal tip of Dobbhoff feeding tube is seen in expected portion of second portion of duodenum. No evidence of bowel obstruction or ileus. Electronically Signed   By: Marijo Conception M.D.   On: 01/28/2021 15:06   ECHOCARDIOGRAM COMPLETE  Result Date: 01/25/2021    ECHOCARDIOGRAM REPORT   Patient Name:   DURIEL DEERY Date of Exam: 02/01/2021 Medical Rec #:  [id number]        Height:       69.0 in Accession #:    [id number]       Weight:       225.5 lb Date of Birth:  1961-06-01        BSA:          2.174 m Patient Age:    49 years         BP:           85/61 mmHg Patient Gender: M                HR:           69 bpm. Exam Location:  Inpatient Procedure: 2D Echo, Cardiac Doppler and Color Doppler Indications:    I31.3 Pericardial effusion  History:        Patient has prior history of Echocardiogram examinations, most                 recent 10/09/2020. Signs/Symptoms:Dyspnea; Risk                 Factors:Hypertension. CKD.  Sonographer:    Jonelle Sidle Dance Referring Phys: [id number] Francesca Jewett  Sonographer Comments: Echo performed with patient supine and  on artificial respirator and patient is morbidly obese. Image acquisition challenging due to patient body habitus. IMPRESSIONS  1. There is large circumferential pericardial effusion with signs of hemodynamic compromise, a pericardiocentesis is recommended. The attending physician Dr Harrell Gave was notified.  2. Left ventricular ejection fraction, by estimation, is 60 to 65%. The left ventricle has normal function. The left ventricle has no regional wall motion  abnormalities. Left ventricular diastolic function could not be evaluated.  3. Right ventricular systolic function is normal. The right ventricular size is underfilled.  4. Left atrial size was mildly dilated.  5. Large pericardial effusion. The pericardial effusion is circumferential. Findings are consistent with cardiac tamponade.  6. The mitral valve is normal in structure. Mild mitral valve regurgitation. No evidence of mitral stenosis.  7. The aortic valve is normal in structure. There is severe calcifcation of the aortic valve. There is severe thickening of the aortic valve. Aortic valve regurgitation is mild. Moderate aortic valve stenosis. Aortic valve mean gradient measures 28.0 mmHg.  8. There is borderline dilatation of the ascending aorta, measuring 40 mm.  9. The inferior vena cava is dilated in size with <50% respiratory variability, suggesting right atrial pressure of 15 mmHg. FINDINGS  Left Ventricle: Left ventricular ejection fraction, by estimation, is 60 to 65%. The left ventricle has normal function. The left ventricle has no regional wall motion abnormalities. The left ventricular internal cavity size was normal in size. There is  no left ventricular hypertrophy. Left ventricular diastolic function could not be evaluated due to atrial fibrillation. Left ventricular diastolic function could not be evaluated. Right Ventricle: The right ventricular size is underfilled. No increase in right ventricular wall thickness. Right ventricular systolic function is normal. Left Atrium: Left atrial size was mildly dilated. Right Atrium: Right atrial size was normal in size. Pericardium: A large pericardial effusion is present. The pericardial effusion is circumferential. There is evidence of cardiac tamponade. Mitral Valve: The mitral valve is normal in structure. There is moderate thickening of the mitral valve leaflet(s). There is moderate calcification of the mitral valve leaflet(s). Mild mitral valve  regurgitation. No evidence of mitral valve stenosis. Tricuspid Valve: The tricuspid valve is normal in structure. Tricuspid valve regurgitation is not demonstrated. No evidence of tricuspid stenosis. Aortic Valve: The aortic valve is normal in structure. There is severe calcifcation of the aortic valve. There is severe thickening of the aortic valve. Aortic valve regurgitation is mild. Aortic regurgitation PHT measures 333 msec. Moderate aortic stenosis is present. Aortic valve mean gradient measures 28.0 mmHg. Aortic valve peak gradient measures 44.6 mmHg. Aortic valve area, by VTI measures 0.99 cm. Pulmonic Valve: The pulmonic valve was normal in structure. Pulmonic valve regurgitation is not visualized. No evidence of pulmonic stenosis. Aorta: The aortic root is normal in size and structure. There is borderline dilatation of the ascending aorta, measuring 40 mm. Venous: The inferior vena cava is dilated in size with less than 50% respiratory variability, suggesting right atrial pressure of 15 mmHg. IAS/Shunts: No atrial level shunt detected by color flow Doppler.  LEFT VENTRICLE PLAX 2D LVIDd:         4.90 cm LVIDs:         3.40 cm LV PW:         1.00 cm LV IVS:        0.90 cm LVOT diam:     1.90 cm LV SV:         57 LV SV Index:   26 LVOT  Area:     2.84 cm  RIGHT VENTRICLE          IVC RV Basal diam:  3.30 cm  IVC diam: 3.00 cm TAPSE (M-mode): 2.5 cm LEFT ATRIUM              Index       RIGHT ATRIUM           Index LA diam:        4.20 cm  1.93 cm/m  RA Area:     23.50 cm LA Vol (A2C):   156.0 ml 71.75 ml/m RA Volume:   70.40 ml  32.38 ml/m LA Vol (A4C):   119.0 ml 54.73 ml/m LA Biplane Vol: 139.0 ml 63.93 ml/m  AORTIC VALVE AV Area (Vmax):    0.87 cm AV Area (Vmean):   0.95 cm AV Area (VTI):     0.99 cm AV Vmax:           334.00 cm/s AV Vmean:          227.000 cm/s AV VTI:            0.576 m AV Peak Grad:      44.6 mmHg AV Mean Grad:      28.0 mmHg LVOT Vmax:         103.05 cm/s LVOT Vmean:         76.000 cm/s LVOT VTI:          0.201 m LVOT/AV VTI ratio: 0.35 AI PHT:            333 msec  AORTA Ao Root diam: 4.00 cm Ao Asc diam:  4.00 cm MITRAL VALVE MV Area (PHT): 3.12 cm     SHUNTS MV Decel Time: 243 msec     Systemic VTI:  0.20 m MV E velocity: 116.00 cm/s  Systemic Diam: 1.90 cm MV A velocity: 69.90 cm/s MV E/A ratio:  1.66 Ena Dawley MD Electronically signed by Ena Dawley MD Signature Date/Time: 02/05/2021/4:45:35 PM    Final     Medications: Infusions: .  prismasol BGK 4/2.5 300 mL/hr at 01/30/21 1043  . ceFEPime (MAXIPIME) IV 1 g (01/13/2021 1735)  . feeding supplement (VITAL 1.5 CAL) 55 mL/hr at 01/13/2021 2046  . fentaNYL infusion INTRAVENOUS 50 mcg/hr (01/30/21 1138)  . norepinephrine (LEVOPHED) Adult infusion 15 mcg/min (01/30/21 1138)  . prismasol BGK 2/2.5 dialysis solution 1,500 mL/hr at 01/30/21 1040  . prismasol BGK 2/2.5 replacement solution 500 mL/hr at 01/30/21 1038  . propofol (DIPRIVAN) infusion 10 mcg/kg/min (01/30/21 1138)    Scheduled Medications: . amiodarone  200 mg Per Tube BID  . B-complex with vitamin C  1 tablet Oral Daily  . chlorhexidine gluconate (MEDLINE KIT)  15 mL Mouth Rinse BID  . Chlorhexidine Gluconate Cloth  6 each Topical Q0600  . clindamycin   Topical BID  . darbepoetin (ARANESP) injection - DIALYSIS  150 mcg Intravenous Q Fri-HD  . diclofenac Sodium  2 g Topical QID  . docusate  100 mg Per Tube BID  . feeding supplement (PROSource TF)  45 mL Per Tube QID  . insulin aspart  1-3 Units Subcutaneous Q4H  . loratadine  10 mg Per Tube Daily  . mouth rinse  15 mL Mouth Rinse 10 times per day  . midodrine  15 mg Per Tube TID WC  . pantoprazole sodium  40 mg Per Tube Daily  . polyethylene glycol  17 g Per Tube Daily  . sodium chloride flush  10-40 mL Intracatheter Q12H  . vancomycin variable dose per unstable renal function (pharmacist dosing)   Does not apply See admin instructions    have reviewed scheduled and prn  medications.  Physical Exam: General: ill appearing, intubated Heart: s1s2, irreg irreg Lungs: diminished air entry bibasilar, intubated Abdomen:soft, Non-tender, distended Extremities: 1+ Bilateral lower extremity edema Neuro: sedated Dialysis Access: LUE AV fistula has good thrill and bruit, rij temp HD catheter  Mark [name] 01/30/2021,11:40 AM  LOS: 9 days

## 2021-01-30 NOTE — Progress Notes (Signed)
ANTICOAGULATION CONSULT NOTE - Initial Consult  Pharmacy Consult for IV heparin Indication: atrial fibrillation  Allergies  Allergen Reactions  . Penicillins Swelling, Rash and Other (See Comments)    Arms and eyes swell & skin breaks out  Has patient had a PCN reaction causing immediate rash, facial/tongue/throat swelling, SOB or lightheadedness with hypotension: Yes Has patient had a PCN reaction causing severe rash involving mucus membranes or skin necrosis: Yes Has patient had a PCN reaction that required hospitalization: No Has patient had a PCN reaction occurring within the last 10 years: No If all of the above answers are "NO", then may proceed with Cephalosporin use.   . Atorvastatin     Muscle pain in legs  . Rosuvastatin     Muscle pain in legs    Patient Measurements: Height: '5\' 9"'$  (175.3 cm) Weight: 104 kg (229 lb 4.5 oz) IBW/kg (Calculated) : 70.7 Heparin Dosing Weight: 95 kg  Vital Signs: Temp: 99.1 F (37.3 C) (03/25 1116) Temp Source: Oral (03/25 1200) BP: 128/69 (03/25 1514) Pulse Rate: 72 (03/25 1514)  Labs: Recent Labs    01/27/21 2330 01/28/21 0348 01/28/21 0420 01/28/21 0900 01/28/21 1216 01/28/21 1554 01/24/2021 0122 01/28/2021 0359 01/06/2021 0408 01/28/2021 0515 01/28/2021 1631 02/03/2021 2003 01/30/21 0403  HGB  --   --    < >  --   --    < > 8.5*   < >  --  7.5*  --  7.3* 7.6*  HCT  --   --    < >  --   --    < > 26.5*   < >  --  23.2*  --  22.0* 23.3*  PLT  --   --    < >  --   --   --  239  --   --  240  --  235 249  APTT 61*  --   --  >200* 61*  --   --   --   --   --   --   --   --   HEPARINUNFRC  --  1.96*  --   --   --   --   --   --   --   --   --   --   --   CREATININE  --  4.24*  --   --   --    < > 3.44*  --   --  3.51* 3.88* 3.84* 4.37*  TROPONINIHS  --   --   --   --   --   --  87*  --  111*  --   --   --   --    < > = values in this interval not displayed.    Estimated Creatinine Clearance: 21.1 mL/min (A) (by C-G formula based on  SCr of 4.37 mg/dL (H)).   Medical History: Past Medical History:  Diagnosis Date  . Anemia    labs today indicate (09/27/11)  . Arthritis    right knee  . Bleeding ulcer    hx of, required 6 units blood  . Blood transfusion    6 units with ulcer repair surgery  . Chronic kidney disease    stage IV, has fistula, never started diaylsis  . Complication of anesthesia    little slow to wake up with last surgery  . Dyspnea    with exertion  . Gout   . Hip dislocation, left ([place]) 10/09/2020  .  Hypertension   . Osteoporosis   . Pneumonia     Medications:  Infusions:  .  prismasol BGK 4/2.5 300 mL/hr at 01/30/21 1043  . ceFEPime (MAXIPIME) IV    . feeding supplement (VITAL 1.5 CAL) 55 mL/hr at 01/08/2021 2046  . fentaNYL infusion INTRAVENOUS 50 mcg/hr (01/30/21 1400)  . heparin    . norepinephrine (LEVOPHED) Adult infusion 13 mcg/min (01/30/21 1420)  . prismasol BGK 2/2.5 dialysis solution 1,500 mL/hr at 01/30/21 1040  . prismasol BGK 2/2.5 replacement solution 500 mL/hr at 01/30/21 1038  . propofol (DIPRIVAN) infusion 10 mcg/kg/min (01/30/21 1400)    Assessment: 60 yo male on chronic anticoagulation with apixaban, was held for pericardial window last night.  Pharmacy asked to resume IV heparin 24 hrs after procedure - so will restart at 1830 pm tonight.  Previously dosing heparin from aPTT since heparin levels falsely elevated from previous apixaban.  Goal of Therapy:  Heparin level 0.3-.0.5 Monitor platelets by anticoagulation protocol: Yes   Plan:  Start IV heparin at 1830 pm tonight at a rate of 1650 units/hr. Check heparin level 8 hrs after rate starts. Daily heparin level and CBC. Will target lower end of therapeutic range given window.  [place] Crane, Roylene Reason, BCCP Clinical Pharmacist  01/30/2021 3:23 PM   [facility] pharmacy phone numbers are listed on [email or web address]

## 2021-01-30 NOTE — Progress Notes (Signed)
Pharmacy Antibiotic Note  Mark [name] is a 60 y.o. male with UTI and staph epi bacteremia.  Pharmacy has been consulted for Vancomycin/cefepime dosing.   Patient was originally started on CRRT then transitioned to HD with last session on 3/23. Patient received vancomycin post iHD 3/23 but none since. With plans to restart CRRT today a random vancomycin level was check and it was 21 just above desired goal. Will give patient a few hours on CRRT this afternoon to allow a little bit more vancomycin to clear and start standard dosing tonight.   Patient currently afebrile, wbc elevated at 26.   Plan: Start vancomycin IV 1g every 24 hours this evening Cefepime to 2g q12 hours while on crrt    Height: '5\' 9"'$  (175.3 cm) Weight: 104 kg (229 lb 4.5 oz) IBW/kg (Calculated) : 70.7  Temp (24hrs), Avg:98.1 F (36.7 C), Min:96.5 F (35.8 C), Max:99.1 F (37.3 C)  Recent Labs  Lab 01/25/21 0132 01/25/21 1541 01/27/21 1300 01/27/21 1450 01/28/21 0420 01/28/21 1600 01/28/2021 0122 01/18/2021 0515 01/20/2021 1631 01/25/2021 2003 01/30/21 0403 01/30/21 1420  WBC 20.3*   < >  --   --  22.3*  --  31.0* 25.2*  --  22.1* 26.2*  --   CREATININE  --    < >  --    < >  --    < > 3.44* 3.51* 3.88* 3.84* 4.37*  --   LATICACIDVEN 1.5  --   --   --   --   --   --   --   --   --   --   --   VANCORANDOM  --   --  21  --   --   --   --   --   --   --   --  21   < > = values in this interval not displayed.    Estimated Creatinine Clearance: 21.1 mL/min (A) (by C-G formula based on SCr of 4.37 mg/dL (H)).    Allergies  Allergen Reactions  . Penicillins Swelling, Rash and Other (See Comments)    Arms and eyes swell & skin breaks out  Has patient had a PCN reaction causing immediate rash, facial/tongue/throat swelling, SOB or lightheadedness with hypotension: Yes Has patient had a PCN reaction causing severe rash involving mucus membranes or skin necrosis: Yes Has patient had a PCN reaction that required  hospitalization: No Has patient had a PCN reaction occurring within the last 10 years: No If all of the above answers are "NO", then may proceed with Cephalosporin use.   . Atorvastatin     Muscle pain in legs  . Rosuvastatin     Muscle pain in legs    Antimicrobials this admission: 3/19 Ceftriaxone >> 3/21 3/21 Cefepime >> (3/27) 3/19 Vanc >>   Microbiology results: 3/18 BCx: 2/2 staph epi (+mecA resistance) 3/17 UCx: proteus penneri (R - CTX) 3/19 MRSA PCR: negative 3/19 thigh wound: abundant G+ cocci, no strep or staph 3/24 bld: ngtd  Thank you for allowing pharmacy to be a part of this patient's care.  Erin Hearing PharmD., BCPS Clinical Pharmacist 01/30/2021 4:24 PM

## 2021-01-30 NOTE — Progress Notes (Signed)
eLink Physician-Brief Progress Note Patient Name: Mark [name] DOB: June 24, 1961 MRN: [id number]   Date of Service  01/30/2021  HPI/Events of Note  Patient with blood sugar of 190 mg  / dl and tube feeds have just been started.  eICU Interventions  CBG with SSI coverage ordered per protocol.        Kerry Kass Fareeha Evon 01/30/2021, 12:32 AM

## 2021-01-30 NOTE — Progress Notes (Signed)
Progress Note  Patient Name: Mark [name] Date of Encounter: 01/30/2021  [facility] Cardiologist: [name], MD   Subjective   S/P pericardial window by Dr. Orvan Seen evening 01/13/2021. Cytology/path pending. Remains on levophed, intubated/sedated. Planned for CRRT today.  Inpatient Medications    Scheduled Meds: . amiodarone  200 mg Per Tube BID  . B-complex with vitamin C  1 tablet Oral Daily  . chlorhexidine gluconate (MEDLINE KIT)  15 mL Mouth Rinse BID  . Chlorhexidine Gluconate Cloth  6 each Topical Q0600  . clindamycin   Topical BID  . darbepoetin (ARANESP) injection - DIALYSIS  150 mcg Intravenous Q Fri-HD  . diclofenac Sodium  2 g Topical QID  . docusate  100 mg Per Tube BID  . feeding supplement (PROSource TF)  45 mL Per Tube QID  . insulin aspart  1-3 Units Subcutaneous Q4H  . loratadine  10 mg Per Tube Daily  . mouth rinse  15 mL Mouth Rinse 10 times per day  . midodrine  15 mg Per Tube TID WC  . pantoprazole sodium  40 mg Per Tube Daily  . polyethylene glycol  17 g Per Tube Daily  . sodium chloride flush  10-40 mL Intracatheter Q12H  . vancomycin variable dose per unstable renal function (pharmacist dosing)   Does not apply See admin instructions   Continuous Infusions: .  prismasol BGK 4/2.5    . ceFEPime (MAXIPIME) IV 1 g (02/04/2021 1735)  . feeding supplement (VITAL 1.5 CAL) 55 mL/hr at 02/03/2021 2046  . fentaNYL infusion INTRAVENOUS 50 mcg/hr (01/30/21 0941)  . norepinephrine (LEVOPHED) Adult infusion 17 mcg/min (01/30/21 0941)  . prismasol BGK 2/2.5 dialysis solution    . prismasol BGK 2/2.5 replacement solution    . propofol (DIPRIVAN) infusion 10 mcg/kg/min (01/30/21 0941)   PRN Meds: acetaminophen **OR** acetaminophen, fentaNYL, Muscle Rub, polyethylene glycol, sodium chloride flush, traMADol   Vital Signs    Vitals:   01/30/21 0700 01/30/21 0743 01/30/21 0800 01/30/21 0900  BP: 125/63 (!) 104/43 99/60 (!) 86/55  Pulse: 92 99 94 (!)  108  Resp: (!) 24 18 19  (!) 0  Temp:  98.8 F (37.1 C)    TempSrc:   Oral   SpO2: 100% 100% 100% 100%  Weight:      Height:        Intake/Output Summary (Last 24 hours) at 01/30/2021 0945 Last data filed at 01/30/2021 0941 Gross per 24 hour  Intake 1796.36 ml  Output 648 ml  Net 1148.36 ml   Last 3 Weights 01/30/2021 01/10/2021 01/28/2021  Weight (lbs) 229 lb 4.5 oz 225 lb 8.5 oz 224 lb 13.9 oz  Weight (kg) 104 kg 102.3 kg 102 kg      Telemetry    Atrial flutter with controlled ventricular response- Personally Reviewed  ECG    01/16/2021 afib RVR, diffuse inferolateral ST changes- Personally Reviewed  Physical Exam   GEN: intubated and sedated NECK: HD cath in IJ CARDIAC: regular rhythm, normal S1 and S2, no rubs or gallops. 2/6 systolic murmur. RESPIRATORY:  Remains on vent, clear anteriorly ABDOMEN: Soft, non-tender, non-distended MUSCULOSKELETAL: sedated SKIN: Warm and dry, trivial edema NEUROLOGIC:  sedated PSYCHIATRIC:  sedated  Labs    High Sensitivity Troponin:   Recent Labs  Lab 01/24/21 1907 01/24/21 2304 01/15/2021 0450 02/01/2021 0122 01/23/2021 0408  TROPONINIHS 70* 64* 41* 87* 111*      Chemistry Recent Labs  Lab 01/24/21 0303 01/24/21 2254 01/24/2021 0122 01/28/2021 0359  02/02/2021 0515 02/03/2021 1631 01/25/2021 2003 01/30/21 0403  NA 132*   < > 137   < > 135 135 134* 134*  K 4.4   < > 4.2   < > 4.3 4.2 4.1 4.4  CL 95*   < > 97*  --  99 98 99 98  CO2 23   < > 27  --  26 25 25 25   GLUCOSE 145*   < > 182*  --  154* 146* 129* 186*  BUN 141*   < > 62*  --  65* 75* 79* 88*  CREATININE 5.75*   < > 3.44*  --  3.51* 3.88* 3.84* 4.37*  CALCIUM 11.6*   < > 11.7*  --  11.8* 12.0* 11.9* 12.0*  PROT 7.6  --  7.8  --   --   --   --   --   ALBUMIN 2.4*   < > 2.4*  --  2.2* 2.1*  --  2.0*  AST 22  --  35  --   --   --   --   --   ALT 15  --  17  --   --   --   --   --   ALKPHOS 68  --  85  --   --   --   --   --   BILITOT 1.2  --  0.9  --   --   --   --   --    GFRNONAA 10*   < > 19*  --  19* 17* 17* 15*  ANIONGAP 14   < > 13  --  10 12 10 11    < > = values in this interval not displayed.     Hematology Recent Labs  Lab 02/05/2021 0515 01/28/2021 2003 01/30/21 0403  WBC 25.2* 22.1* 26.2*  RBC 2.38* 2.26* 2.37*  HGB 7.5* 7.3* 7.6*  HCT 23.2* 22.0* 23.3*  MCV 97.5 97.3 98.3  MCH 31.5 32.3 32.1  MCHC 32.3 33.2 32.6  RDW 17.1* 17.4* 17.9*  PLT 240 235 249    BNPNo results for input(s): BNP, PROBNP in the last 168 hours.   DDimer No results for input(s): DDIMER in the last 168 hours.   Radiology    DG Chest 1 View  Result Date: 01/13/2021 CLINICAL DATA:  Hypoxia EXAM: CHEST  1 VIEW COMPARISON:  January 29, 2021 study obtained earlier in the day FINDINGS: Endotracheal tube tip is 4.3 cm above the carina. Central catheter tip is in the superior vena cava. Enteric tube tip is below the diaphragm. There is a chest tube on the left. There is a left apical pneumothorax without tension component. There is persistent airspace consolidation throughout portions of the right mid and lower lung regions, stable. Atelectatic changes are noted in the left mid lung and left base regions. There is stable cardiomegaly with pulmonary vascularity within normal limits. No adenopathy evident. No bone lesions. IMPRESSION: Tube and catheter positions as described. Chest tube present on the left small left apical pneumothorax. No tension component. Consolidation in portions of the right mid and lower lung regions remains. Atelectatic change in the left mid and lower lung regions present. Stable cardiomegaly. Electronically Signed   By: Lowella Grip III M.D.   On: 01/20/2021 20:27   DG Abd 1 View  Result Date: 01/09/2021 CLINICAL DATA:  Feeding tube placement EXAM: ABDOMEN - 1 VIEW COMPARISON:  None. FINDINGS: Feeding tube tip is at the level  of the first portion of the duodenum. There is moderate air in the stomach. There is moderate stool in the colon. There is no  bowel dilatation or air-fluid level in visualized abdomen region. No free air evident on supine examination. IMPRESSION: Feeding tube tip at first portion of duodenum. No overt bowel obstruction or free air evident on somewhat limited supine examination. Electronically Signed   By: Lowella Grip III M.D.   On: 01/14/2021 20:28   DG CHEST PORT 1 VIEW  Result Date: 01/25/2021 CLINICAL DATA:  Intubation.  Pulmonary edema. EXAM: PORTABLE CHEST 1 VIEW COMPARISON:  01/06/2021. FINDINGS: Endotracheal tube, feeding tube, right IJ line stable position. Severe cardiomegaly again noted. Low lung volumes with persistent prominent bibasilar atelectasis. Mild infiltrate left mid lung cannot be excluded on today's exam. No prominent pleural effusion. No pneumothorax. IMPRESSION: 1.  Lines and tubes in stable position. 2.  Severe cardiomegaly again noted. 3. Low lung volumes with persistent prominent bibasilar atelectasis. Mild infiltrate left mid lung cannot be excluded on today's exam. Electronically Signed   By: Marcello Moores  Register   On: 01/16/2021 07:12   DG CHEST PORT 1 VIEW  Result Date: 01/30/2021 CLINICAL DATA:  Respiratory failure EXAM: PORTABLE CHEST 1 VIEW COMPARISON:  Radiograph 01/28/2021, CT 11/30/2019 FINDINGS: Endotracheal tube terminates in the mid trachea, 4 cm from the carina. Transesophageal tube tip terminates below the margins of imaging, beyond the GE junction. Right IJ catheter tip terminates in the mid SVC. Redemonstration of the extreme cardiomegaly. Some increasing retrocardiac opacity bilaterally likely reflecting worsening volume loss/atelectatic changes of the lower lobes with some mild pulmonary vascular congestion and hazy interstitial opacity suggestive of some edematous change. No pneumothorax. Partial obscuration of the hemidiaphragms may be related to atelectasis or layering fluid. No acute osseous or other soft tissue abnormality. IMPRESSION: 1. Lines and tubes as above. 2. Increasing  retrocardiac opacity in the medial lung bases likely reflecting worsening volume loss/atelectasis in lower lobes. 3. Persisting hazy opacities and vascular congestion suggesting at least mild interstitial edema. Possible effusions. Electronically Signed   By: Lovena Le M.D.   On: 02/01/2021 01:33   DG Abd Portable 1V  Result Date: 01/28/2021 CLINICAL DATA:  Feeding tube placement. EXAM: PORTABLE ABDOMEN - 1 VIEW COMPARISON:  None. FINDINGS: The bowel gas pattern is normal. Distal tip of Dobbhoff feeding tube is seen in expected portion of second portion of duodenum. No radio-opaque calculi or other significant radiographic abnormality are seen. IMPRESSION: Distal tip of Dobbhoff feeding tube is seen in expected portion of second portion of duodenum. No evidence of bowel obstruction or ileus. Electronically Signed   By: Marijo Conception M.D.   On: 01/28/2021 15:06   ECHOCARDIOGRAM COMPLETE  Result Date: 01/25/2021    ECHOCARDIOGRAM REPORT   Patient Name:   Mark [name] Date of Exam: 01/14/2021 Medical Rec #:  [id number]        Height:       69.0 in Accession #:    [id number]       Weight:       225.5 lb Date of Birth:  1961/10/01        BSA:          2.174 m Patient Age:    60 years         BP:           85/61 mmHg Patient Gender: M  HR:           69 bpm. Exam Location:  Inpatient Procedure: 2D Echo, Cardiac Doppler and Color Doppler Indications:    I31.3 Pericardial effusion  History:        Patient has prior history of Echocardiogram examinations, most                 recent 10/09/2020. Signs/Symptoms:Dyspnea; Risk                 Factors:Hypertension. CKD.  Sonographer:    Jonelle Sidle Dance Referring Phys: [id number] Francesca Jewett  Sonographer Comments: Echo performed with patient supine and on artificial respirator and patient is morbidly obese. Image acquisition challenging due to patient body habitus. IMPRESSIONS  1. There is large circumferential pericardial effusion with signs of  hemodynamic compromise, a pericardiocentesis is recommended. The attending physician Dr Harrell Gave was notified.  2. Left ventricular ejection fraction, by estimation, is 60 to 65%. The left ventricle has normal function. The left ventricle has no regional wall motion abnormalities. Left ventricular diastolic function could not be evaluated.  3. Right ventricular systolic function is normal. The right ventricular size is underfilled.  4. Left atrial size was mildly dilated.  5. Large pericardial effusion. The pericardial effusion is circumferential. Findings are consistent with cardiac tamponade.  6. The mitral valve is normal in structure. Mild mitral valve regurgitation. No evidence of mitral stenosis.  7. The aortic valve is normal in structure. There is severe calcifcation of the aortic valve. There is severe thickening of the aortic valve. Aortic valve regurgitation is mild. Moderate aortic valve stenosis. Aortic valve mean gradient measures 28.0 mmHg.  8. There is borderline dilatation of the ascending aorta, measuring 40 mm.  9. The inferior vena cava is dilated in size with <50% respiratory variability, suggesting right atrial pressure of 15 mmHg. FINDINGS  Left Ventricle: Left ventricular ejection fraction, by estimation, is 60 to 65%. The left ventricle has normal function. The left ventricle has no regional wall motion abnormalities. The left ventricular internal cavity size was normal in size. There is  no left ventricular hypertrophy. Left ventricular diastolic function could not be evaluated due to atrial fibrillation. Left ventricular diastolic function could not be evaluated. Right Ventricle: The right ventricular size is underfilled. No increase in right ventricular wall thickness. Right ventricular systolic function is normal. Left Atrium: Left atrial size was mildly dilated. Right Atrium: Right atrial size was normal in size. Pericardium: A large pericardial effusion is present. The pericardial  effusion is circumferential. There is evidence of cardiac tamponade. Mitral Valve: The mitral valve is normal in structure. There is moderate thickening of the mitral valve leaflet(s). There is moderate calcification of the mitral valve leaflet(s). Mild mitral valve regurgitation. No evidence of mitral valve stenosis. Tricuspid Valve: The tricuspid valve is normal in structure. Tricuspid valve regurgitation is not demonstrated. No evidence of tricuspid stenosis. Aortic Valve: The aortic valve is normal in structure. There is severe calcifcation of the aortic valve. There is severe thickening of the aortic valve. Aortic valve regurgitation is mild. Aortic regurgitation PHT measures 333 msec. Moderate aortic stenosis is present. Aortic valve mean gradient measures 28.0 mmHg. Aortic valve peak gradient measures 44.6 mmHg. Aortic valve area, by VTI measures 0.99 cm. Pulmonic Valve: The pulmonic valve was normal in structure. Pulmonic valve regurgitation is not visualized. No evidence of pulmonic stenosis. Aorta: The aortic root is normal in size and structure. There is borderline dilatation of the ascending aorta, measuring 40  mm. Venous: The inferior vena cava is dilated in size with less than 50% respiratory variability, suggesting right atrial pressure of 15 mmHg. IAS/Shunts: No atrial level shunt detected by color flow Doppler.  LEFT VENTRICLE PLAX 2D LVIDd:         4.90 cm LVIDs:         3.40 cm LV PW:         1.00 cm LV IVS:        0.90 cm LVOT diam:     1.90 cm LV SV:         57 LV SV Index:   26 LVOT Area:     2.84 cm  RIGHT VENTRICLE          IVC RV Basal diam:  3.30 cm  IVC diam: 3.00 cm TAPSE (M-mode): 2.5 cm LEFT ATRIUM              Index       RIGHT ATRIUM           Index LA diam:        4.20 cm  1.93 cm/m  RA Area:     23.50 cm LA Vol (A2C):   156.0 ml 71.75 ml/m RA Volume:   70.40 ml  32.38 ml/m LA Vol (A4C):   119.0 ml 54.73 ml/m LA Biplane Vol: 139.0 ml 63.93 ml/m  AORTIC VALVE AV Area (Vmax):     0.87 cm AV Area (Vmean):   0.95 cm AV Area (VTI):     0.99 cm AV Vmax:           334.00 cm/s AV Vmean:          227.000 cm/s AV VTI:            0.576 m AV Peak Grad:      44.6 mmHg AV Mean Grad:      28.0 mmHg LVOT Vmax:         103.05 cm/s LVOT Vmean:        76.000 cm/s LVOT VTI:          0.201 m LVOT/AV VTI ratio: 0.35 AI PHT:            333 msec  AORTA Ao Root diam: 4.00 cm Ao Asc diam:  4.00 cm MITRAL VALVE MV Area (PHT): 3.12 cm     SHUNTS MV Decel Time: 243 msec     Systemic VTI:  0.20 m MV E velocity: 116.00 cm/s  Systemic Diam: 1.90 cm MV A velocity: 69.90 cm/s MV E/A ratio:  1.66 Ena Dawley MD Electronically signed by Ena Dawley MD Signature Date/Time: 01/14/2021/4:45:35 PM    Final     Cardiac Studies   Echocardiogram 01/10/2021: 1. There is large circumferential pericardial effusion with signs of  hemodynamic compromise, a pericardiocentesis is recommended. The attending  physician Dr Harrell Gave was notified.  2. Left ventricular ejection fraction, by estimation, is 60 to 65%. The  left ventricle has normal function. The left ventricle has no regional  wall motion abnormalities. Left ventricular diastolic function could not  be evaluated.  3. Right ventricular systolic function is normal. The right ventricular  size is underfilled.  4. Left atrial size was mildly dilated.  5. Large pericardial effusion. The pericardial effusion is  circumferential. Findings are consistent with cardiac tamponade.  6. The mitral valve is normal in structure. Mild mitral valve  regurgitation. No evidence of mitral stenosis.  7. The aortic valve is normal in structure. There is  severe calcifcation  of the aortic valve. There is severe thickening of the aortic valve.  Aortic valve regurgitation is mild. Moderate aortic valve stenosis. Aortic  valve mean gradient measures 28.0  mmHg.  8. There is borderline dilatation of the ascending aorta, measuring 40  mm.  9. The inferior vena  cava is dilated in size with <50% respiratory  variability, suggesting right atrial pressure of 15 mmHg.   Echocardiogram 10/09/2020: 1. No subcostal views.  2. Left ventricular ejection fraction, by estimation, is 60 to 65%. The  left ventricle has normal function. The left ventricle has no regional  wall motion abnormalities. There is mild left ventricular hypertrophy.  Left ventricular diastolic parameters  are indeterminate.  3. Right ventricular systolic function is normal. The right ventricular  size is normal.  4. Left atrial size was severely dilated.  5. The mitral valve is normal in structure. Mild mitral valve  regurgitation. No evidence of mitral stenosis. Severe mitral annular  calcification.  6. The aortic valve is normal in structure. Aortic valve regurgitation is  trivial. Moderate aortic valve stenosis.   DCCV 01/20/2021 Evaluation: Findings: Post procedure EKG shows:Sinus with mobitz 1 AVB and PACs Complications:None  After the procedure, the patient was hypotensive requiring pressor support. Was also notably overloaded on physical exam. Given tenuous status, decision was made to admit to in-patient for pressor support and IV diuresis. Plan discussed with admitting physician, Dr. Ellyn Hack and primary cardiologist Dr. Margaretann Loveless.  Patient Profile     60 y.o. male with a history of moderate AS, CKD V, HTN, gout, persistent atrial flutter, DM2, and hx of hip dislocations and recent acute on chronic diastolic CHF with acute respiratory failure>> found to be in atrial flutter. Plan was for OP DCCV>>scheduled for 02/02/2021. On arrival pt found to be severely volume overloaded, was hypotensive post procedure requiring admission. Had PEA arrest AM 01/20/2021, thought to be respiratory but echo later showed large effusion with tamponade, s/p pericardial window 02/03/2021.  Assessment & Plan    PEA arrest 01/25/2021 Acute hypoxic respiratory failure Hypotension requiring pressor  support -suspect respiratory given bipap off, low O2 sats, rapid improvement post CPR and intubation -however, echo showed large pericardial effusion with evidence of tamponade. Discussed pericardiocentesis, but as patient had been anticoagulated, felt to be high risk for tap in cath lab. CT surgery consulted, underwent pericardial window with Dr. Orvan Seen 02/03/2021 -fluid cytology/pathology pending -remains intubated, on levophed for hypotension. Hemodynamics not significantly improved post window  Typical atrial flutter with variable block: with coarse flutter/fib on telemetry as well -outpatient DCCV 01/22/2021, initially converted to sinus with Mobitz 1 AV block, PACs/PVCs. Returned to atrial flutter -based on telemetry, converted back to sinus rhythm with very long PR around 3 AM 01/27/21. However, early 3/23 returned to atrial flutter, but ventricularly rate controlled CHA2DS2/VAS Stroke Risk Points=3 -had been continued on anticoagulation given thromboembolic risk with recent cardioversion. However, anticoagulation held when tamponade found. Given pericardial window 01/24/2021, risk of anticoagulation outweighs need given recent cardioversion. Will restart when safe. -continue amiodarone, will change to 200 mg per tube BID.  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 coag negative staph bacteremia  vs contaminant -per primary team  CRITICAL CARE Patient is critically ill with multiple organ systems affected and requires high complexity decision making. Total critical care time: 45 minutes. This time includes  gathering of history, evaluation of patient's response to treatment, examination of patient, review of laboratory and imaging studies, and coordination with consultants. Greater than 50% of time spent in direct patient care.  For questions or updates, please contact [place] Please consult [email or web address] for contact info under     Signed, Buford Dresser, MD  01/30/2021, 9:45 AM

## 2021-01-30 NOTE — Progress Notes (Signed)
NAME:  Mark [name], MRN:  [id number], DOB:  1961/05/22, LOS: 9 ADMISSION DATE:  01/20/2021, CONSULTATION DATE: 3/19 REFERRING MD:  Dr. Erlinda [name], CHIEF COMPLAINT:  TRH    History of Present Illness:  60 y/o M who presented to [facility] on 3/16 for elective cardioversion but presented with shortness of breath, leg swelling and palpitations.   The patient has known CKD IV with baseline sr cr of 5, conservative management, L arm AVF in place in anticipation of HD.  On presentation, he was found to have significant volume overload needing admission per TRH.  After cardioversion he became transiently hypotensive and briefly required neosynephrine gtt.  He was admitted and Nephrology was consulted. Hospital course notable for Proteus UTI, anemia, intolerance to initial HD (chest pain, SVT) requiring brief CVVHD and BIPAP use.  In the ICU he continued to require bipap. He was transitioned to [facility] and had one session on 3/23 with 3L removed / -750 for day.  In the early am of 3/24 the patient pulled his bipap mask off and suffered a respiratory arrest requiring intubation and one round of CPR.   Pertinent  Medical History  CKD IV - HD started this admit (01/30/2021) HTN HLD Chronic Diastolic CHF  Moderate AS Atrial Flutter - on Eliquis  Obesity  Anemia of Chronic Disease   Significant Hospital Events: Including procedures, antibiotic start and stop dates in addition to other pertinent events    3/16 Admit after cardioversion, SOB, leg swelling / palpitations prior to presentation   3/19 PCCM consulted   3/20 CRRT started  3/21 Improved mental status, wore BiPAP overnight. Staph epidermis growing from [facility]. UC growing proteus, abx changed from ceftriaxone to cefepime.  Fever 100.2, -3.6L in last 24 hours.    3/21 CVVHD stopped.  3/23 Tolerated iHD  3/24 Respiratory arrest, PEA, (pulled bipap off), intubated, re-cultured, ECHO tamponade, Pericardial window, Pericardial drain 28Fr by Mark [name]  3/25  palliative care consulted   Interim History / Subjective:   Remains critically ill. Intubated on life support. Pericardial drain   Objective   Blood pressure 125/63, pulse 92, temperature 98.5 F (36.9 C), temperature source Oral, resp. rate (!) 24, height '5\' 9"'$  (1.753 m), weight 104 kg, SpO2 100 %.    Vent Mode: PRVC FiO2 (%):  [40 %-50 %] 40 % Set Rate:  [18 bmp] 18 bmp Vt Set:  [560 mL] 560 mL PEEP:  [5 cmH20] 5 cmH20 Plateau Pressure:  [16 cmH20-22 cmH20] 20 cmH20 IPAP 20 EPAP 8  Intake/Output Summary (Last 24 hours) at 01/30/2021 [id number] Last data filed at 01/30/2021 0600 Gross per 24 hour  Intake 1686.83 ml  Output 98 ml  Net 1588.83 ml   Filed Weights   01/28/21 1435 01/31/2021 0455 01/30/21 0315  Weight: 102 kg 102.3 kg 104 kg    Examination: General: chronically ill appearing male, intubated on life support  HEENT: ETT in place, tracks with sedation lightened  Neuro: sedated on propofol but follows commands if aroused  CV: irregular, s1 s2, distant heart tones  PULM:  BL vented breaths  GI: soft, NT ND  Extremities: warm/dry, no edema  Skin: no rash  VVS: L AVF, palpable thrill       Resolved Hospital Problem list     Assessment & Plan:   PEA Arrest Cardiac Tamponade Suspect respiratory driven as pulled bipap off + tamponade - s/p drain  Supportive care Remains in shock on levophed   Acute Hypercarbic Respiratory Failure in setting  of Acute Diastolic CHF On mechanical ventilation post cardiac arrest   P: Adult mechanical vent support  PEEP/FIO2 wean for sats >90%  PAD for sedation  RASS -1 goal   CKD V / New ESRD  HD initiated this admit, did not tolerate initial iHD due to chest pain on 3/19, then transitioned to CVVHD.  Repeat iHD on 3/23.   P: Restart CVVHD as remains on pressors   Acute Exacerbation of dCHF Atrial Fibrillation / Flutter  Chronic Hypotension Acute on Chronic Diastolic HF Moderate Aortic Stenosis  S/P DCCV 3/16 with brief  hypotension requiring neo, chest pain post procedure. On eliquis previously for AFlutter.  P: Continue midodrine  Holding heparin ?restart today, will discuss with cardiology   Macrocytic Anemia  Reported red stool, hx of gastric ulcer. S/P PRBC 3/19, 3/22. Hgb 6.5 (3/22), transfused, HGB up to 8.3 post transfusion (3/22), now 7.1 (3/23) P: Follow H&H Conservative transfusion threshold   BPH -flomax  - condom cath for now - watch for retention with prn bladder scans   Proteus UTI Chronic Hydradenitis / L Groin Wound, Small Wound on Right Thigh CNS Bacteremia vs Contaminant with 2/4 bottles Leukocytosis CCS evaluated groin wound, no rec's for I&D or CT.   -appreciate CCS > rec's for topical clinda  P: Cefepime + vancomycin continued   Left Shoulder Pain  - op follow up    Moderate Protein Calorie Malnutrition Poor intake since admit with BiPAP use / AMS  - TF continued   GOC: Consulted palliative care  If he is unable to ever tolerate iHD due to BP issues at baseline I am not sure he has an endpoint. We need help clarifying GOCs.   Best practice (evaluated daily)  Diet:  TF Pain/Anxiety/Delirium protocol (if indicated): No VAP protocol (if indicated): Not indicated DVT prophylaxis: Systemic AC GI prophylaxis: PPI Glucose control:  Basal insulin No Central venous access:  Yes, and it is still needed Arterial line:  N/A Foley:  N/A Mobility:  bed rest  PT consulted: N/A Last date of multidisciplinary goals of care discussion: Brother updated 3/23 at bedside. He is unsure if "Mark [name]" would want life support / aggressive care.  We discussed short term support and he is agreeable.  Code Status:  full code Disposition: ICU    This patient is critically ill with multiple organ system failure; which, requires frequent high complexity decision making, assessment, support, evaluation, and titration of therapies. This was completed through the application of advanced  monitoring technologies and extensive interpretation of multiple databases. During this encounter critical care time was devoted to patient care services described in this note for 33 minutes.  Garner Nash, DO [facility] 01/30/2021 7:18 AM

## 2021-01-31 DIAGNOSIS — J9601 Acute respiratory failure with hypoxia: Secondary | ICD-10-CM

## 2021-01-31 DIAGNOSIS — Z7189 Other specified counseling: Secondary | ICD-10-CM

## 2021-01-31 DIAGNOSIS — I314 Cardiac tamponade: Secondary | ICD-10-CM | POA: Diagnosis not present

## 2021-01-31 DIAGNOSIS — I469 Cardiac arrest, cause unspecified: Secondary | ICD-10-CM | POA: Diagnosis not present

## 2021-01-31 DIAGNOSIS — I484 Atypical atrial flutter: Secondary | ICD-10-CM | POA: Diagnosis not present

## 2021-01-31 DIAGNOSIS — I5033 Acute on chronic diastolic (congestive) heart failure: Secondary | ICD-10-CM | POA: Diagnosis not present

## 2021-01-31 DIAGNOSIS — I959 Hypotension, unspecified: Secondary | ICD-10-CM | POA: Diagnosis not present

## 2021-01-31 DIAGNOSIS — Z789 Other specified health status: Secondary | ICD-10-CM

## 2021-01-31 DIAGNOSIS — E8779 Other fluid overload: Secondary | ICD-10-CM | POA: Diagnosis not present

## 2021-01-31 LAB — GLUCOSE, CAPILLARY
Glucose-Capillary: 213 mg/dL — ABNORMAL HIGH (ref 70–99)
Glucose-Capillary: 188 mg/dL — ABNORMAL HIGH (ref 70–99)
Glucose-Capillary: 176 mg/dL — ABNORMAL HIGH (ref 70–99)
Glucose-Capillary: 161 mg/dL — ABNORMAL HIGH (ref 70–99)
Glucose-Capillary: 162 mg/dL — ABNORMAL HIGH (ref 70–99)
Glucose-Capillary: 166 mg/dL — ABNORMAL HIGH (ref 70–99)

## 2021-01-31 LAB — RENAL FUNCTION PANEL
Albumin: 1.8 g/dL — ABNORMAL LOW (ref 3.5–5.0)
Albumin: 1.9 g/dL — ABNORMAL LOW (ref 3.5–5.0)
Anion gap: 7 (ref 5–15)
Anion gap: 6 (ref 5–15)
BUN: 55 mg/dL — ABNORMAL HIGH (ref 8–23)
BUN: 42 mg/dL — ABNORMAL HIGH (ref 8–23)
CO2: 25 mmol/L (ref 22–32)
CO2: 28 mmol/L (ref 22–32)
Calcium: 10.9 mg/dL — ABNORMAL HIGH (ref 8.9–10.3)
Calcium: 11.5 mg/dL — ABNORMAL HIGH (ref 8.9–10.3)
Chloride: 100 mmol/L (ref 98–111)
Chloride: 101 mmol/L (ref 98–111)
Creatinine, Ser: 2.73 mg/dL — ABNORMAL HIGH (ref 0.61–1.24)
Creatinine, Ser: 2.12 mg/dL — ABNORMAL HIGH (ref 0.61–1.24)
GFR, Estimated: 26 mL/min — ABNORMAL LOW (ref >60)
GFR, Estimated: 35 mL/min — ABNORMAL LOW (ref >60)
Glucose, Bld: 229 mg/dL — ABNORMAL HIGH (ref 70–99)
Glucose, Bld: 175 mg/dL — ABNORMAL HIGH (ref 70–99)
Phosphorus: 4.6 mg/dL (ref 2.5–4.6)
Phosphorus: 4.7 mg/dL — ABNORMAL HIGH (ref 2.5–4.6)
Potassium: 3.9 mmol/L (ref 3.5–5.1)
Potassium: 4.1 mmol/L (ref 3.5–5.1)
Sodium: 132 mmol/L — ABNORMAL LOW (ref 135–145)
Sodium: 135 mmol/L (ref 135–145)

## 2021-01-31 LAB — CBC
HCT: 23.1 % — ABNORMAL LOW (ref 39.0–52.0)
Hemoglobin: 7.1 g/dL — ABNORMAL LOW (ref 13.0–17.0)
MCH: 31.7 pg (ref 26.0–34.0)
MCHC: 30.7 g/dL (ref 30.0–36.0)
MCV: 103.1 fL — ABNORMAL HIGH (ref 80.0–100.0)
Platelets: 189 10*3/uL (ref 150–400)
RBC: 2.24 MIL/uL — ABNORMAL LOW (ref 4.22–5.81)
RDW: 18.1 % — ABNORMAL HIGH (ref 11.5–15.5)
WBC: 36.3 10*3/uL — ABNORMAL HIGH (ref 4.0–10.5)
nRBC: 2.9 % — ABNORMAL HIGH (ref 0.0–0.2)

## 2021-01-31 LAB — HEPARIN LEVEL (UNFRACTIONATED)
Heparin Unfractionated: 1.86 IU/mL — ABNORMAL HIGH (ref 0.30–0.70)
Heparin Unfractionated: 1.38 IU/mL — ABNORMAL HIGH (ref 0.30–0.70)

## 2021-01-31 LAB — MAGNESIUM: Magnesium: 2.3 mg/dL (ref 1.7–2.4)

## 2021-01-31 LAB — APTT
aPTT: 67 seconds — ABNORMAL HIGH (ref 24–36)
aPTT: 79 seconds — ABNORMAL HIGH (ref 24–36)

## 2021-01-31 MED ORDER — MIDAZOLAM HCL 2 MG/2ML IJ SOLN
2.0000 mg | INTRAMUSCULAR | Status: DC | PRN
  Administered 2021-02-02 – 2021-02-06 (×2): 4 mg via INTRAVENOUS
  Filled 2021-01-31 (×2): qty 2
  Filled 2021-01-31 (×3): qty 4

## 2021-01-31 MED ORDER — FENTANYL BOLUS VIA INFUSION
50.0000 ug | INTRAVENOUS | Status: DC | PRN
  Administered 2021-01-31 – 2021-02-01 (×7): 50 ug via INTRAVENOUS
  Administered 2021-02-01 – 2021-02-02 (×2): 100 ug via INTRAVENOUS
  Administered 2021-02-03: 50 ug via INTRAVENOUS
  Administered 2021-02-03: 100 ug via INTRAVENOUS
  Administered 2021-02-03 (×4): 50 ug via INTRAVENOUS
  Filled 2021-01-31: qty 100

## 2021-01-31 NOTE — Progress Notes (Signed)
Progress Note  Patient Name: Mark [name] Date of Encounter: 01/31/2021  [facility] Cardiologist: [name], MD   Subjective   Remains intubated and sedated. Continues to require levophed for hypotension. Remains on CRRT.  Inpatient Medications    Scheduled Meds: . amiodarone  200 mg Per Tube BID  . B-complex with vitamin C  1 tablet Oral Daily  . chlorhexidine gluconate (MEDLINE KIT)  15 mL Mouth Rinse BID  . Chlorhexidine Gluconate Cloth  6 each Topical Q0600  . clindamycin   Topical BID  . darbepoetin (ARANESP) injection - DIALYSIS  150 mcg Intravenous Q Fri-HD  . diclofenac Sodium  2 g Topical QID  . docusate  100 mg Per Tube BID  . feeding supplement (PROSource TF)  45 mL Per Tube QID  . insulin aspart  1-3 Units Subcutaneous Q4H  . loratadine  10 mg Per Tube Daily  . mouth rinse  15 mL Mouth Rinse 10 times per day  . midodrine  15 mg Per Tube TID WC  . pantoprazole sodium  40 mg Per Tube Daily  . polyethylene glycol  17 g Per Tube Daily  . sodium chloride flush  10-40 mL Intracatheter Q12H   Continuous Infusions: .  prismasol BGK 4/2.5 300 mL/hr at 01/31/21 0338  . ceFEPime (MAXIPIME) IV 2 g (01/31/21 0856)  . feeding supplement (VITAL 1.5 CAL) 55 mL/hr at 01/30/21 1500  . fentaNYL infusion INTRAVENOUS 75 mcg/hr (01/31/21 0800)  . heparin 1,650 Units/hr (01/31/21 0858)  . norepinephrine (LEVOPHED) Adult infusion 15 mcg/min (01/31/21 0800)  . prismasol BGK 2/2.5 dialysis solution 1,500 mL/hr at 01/31/21 0812  . prismasol BGK 2/2.5 replacement solution 500 mL/hr at 01/31/21 0810  . propofol (DIPRIVAN) infusion 10 mcg/kg/min (01/31/21 0800)  . vancomycin Stopped (01/31/21 0054)   PRN Meds: acetaminophen **OR** acetaminophen, fentaNYL, midazolam, Muscle Rub, polyethylene glycol, sodium chloride flush, traMADol   Vital Signs    Vitals:   01/31/21 0745 01/31/21 0800 01/31/21 0815 01/31/21 0900  BP:  (!) 93/57  (!) 84/47  Pulse: (!) 56 (!) 56 (!)  56 (!) 56  Resp: 16 19 (!) 21 18  Temp:      TempSrc:      SpO2: 100% 100% 90% 100%  Weight:      Height:        Intake/Output Summary (Last 24 hours) at 01/31/2021 0905 Last data filed at 01/31/2021 0800 Gross per 24 hour  Intake 2493.48 ml  Output 3739 ml  Net -1245.52 ml   Last 3 Weights 01/31/2021 01/30/2021 02/04/2021  Weight (lbs) 238 lb 5.1 oz 229 lb 4.5 oz 225 lb 8.5 oz  Weight (kg) 108.1 kg 104 kg 102.3 kg      Telemetry    Atrial flutter with controlled ventricular response, occasional PVCs- Personally Reviewed  ECG    01/12/2021 afib RVR, diffuse inferolateral ST changes- Personally Reviewed  Physical Exam   GEN: intubated and sedated NECK: IJ has HD cath CARDIAC: largely regular rhythm, normal S1 and S2, no rubs or gallops. 2/6 systolic murmur. VASCULAR: Radial pulses 2+ bilaterally.  RESPIRATORY:  Ventilator breath sounds, clear anteriorly ABDOMEN: Soft, non-tender, non-distended MUSCULOSKELETAL:  Moves all 4 limbs independently SKIN: Warm and dry, no significant edema NEUROLOGIC:  sedated PSYCHIATRIC:  sedated  Labs    High Sensitivity Troponin:   Recent Labs  Lab 01/24/21 1907 01/24/21 2304 01/22/2021 0450 01/13/2021 0122 01/06/2021 0408  TROPONINIHS 70* 64* 41* 87* 111*  Chemistry Recent Labs  Lab 01/19/2021 0122 02/04/2021 0359 01/30/21 0403 01/30/21 1600 01/31/21 0323  NA 137   < > 134* 134* 132*  K 4.2   < > 4.4 4.2 3.9  CL 97*   < > 98 103 100  CO2 27   < > 25 25 25   GLUCOSE 182*   < > 186* 209* 229*  BUN 62*   < > 88* 78* 55*  CREATININE 3.44*   < > 4.37* 3.72* 2.73*  CALCIUM 11.7*   < > 12.0* 10.9* 10.9*  PROT 7.8  --   --   --   --   ALBUMIN 2.4*   < > 2.0* 1.8* 1.8*  AST 35  --   --   --   --   ALT 17  --   --   --   --   ALKPHOS 85  --   --   --   --   BILITOT 0.9  --   --   --   --   GFRNONAA 19*   < > 15* 18* 26*  ANIONGAP 13   < > 11 6 7    < > = values in this interval not displayed.     Hematology Recent Labs  Lab  01/30/2021 0515 01/25/2021 2003 01/30/21 0403  WBC 25.2* 22.1* 26.2*  RBC 2.38* 2.26* 2.37*  HGB 7.5* 7.3* 7.6*  HCT 23.2* 22.0* 23.3*  MCV 97.5 97.3 98.3  MCH 31.5 32.3 32.1  MCHC 32.3 33.2 32.6  RDW 17.1* 17.4* 17.9*  PLT 240 235 249    BNPNo results for input(s): BNP, PROBNP in the last 168 hours.   DDimer No results for input(s): DDIMER in the last 168 hours.   Radiology    DG Chest 1 View  Result Date: 01/07/2021 CLINICAL DATA:  Hypoxia EXAM: CHEST  1 VIEW COMPARISON:  January 29, 2021 study obtained earlier in the day FINDINGS: Endotracheal tube tip is 4.3 cm above the carina. Central catheter tip is in the superior vena cava. Enteric tube tip is below the diaphragm. There is a chest tube on the left. There is a left apical pneumothorax without tension component. There is persistent airspace consolidation throughout portions of the right mid and lower lung regions, stable. Atelectatic changes are noted in the left mid lung and left base regions. There is stable cardiomegaly with pulmonary vascularity within normal limits. No adenopathy evident. No bone lesions. IMPRESSION: Tube and catheter positions as described. Chest tube present on the left small left apical pneumothorax. No tension component. Consolidation in portions of the right mid and lower lung regions remains. Atelectatic change in the left mid and lower lung regions present. Stable cardiomegaly. Electronically Signed   By: Lowella Grip III M.D.   On: 01/11/2021 20:27   DG Abd 1 View  Result Date: 01/09/2021 CLINICAL DATA:  Feeding tube placement EXAM: ABDOMEN - 1 VIEW COMPARISON:  None. FINDINGS: Feeding tube tip is at the level of the first portion of the duodenum. There is moderate air in the stomach. There is moderate stool in the colon. There is no bowel dilatation or air-fluid level in visualized abdomen region. No free air evident on supine examination. IMPRESSION: Feeding tube tip at first portion of duodenum. No  overt bowel obstruction or free air evident on somewhat limited supine examination. Electronically Signed   By: Lowella Grip III M.D.   On: 01/12/2021 20:28   ECHOCARDIOGRAM COMPLETE  Result Date: 01/18/2021  ECHOCARDIOGRAM REPORT   Patient Name:   Mark [name] Date of Exam: 01/10/2021 Medical Rec #:  [id number]        Height:       69.0 in Accession #:    [id number]       Weight:       225.5 lb Date of Birth:  [birth year]        BSA:          2.174 m Patient Age:    60 years         BP:           85/61 mmHg Patient Gender: M                HR:           69 bpm. Exam Location:  Inpatient Procedure: 2D Echo, Cardiac Doppler and Color Doppler Indications:    I31.3 Pericardial effusion  History:        Patient has prior history of Echocardiogram examinations, most                 recent 10/09/2020. Signs/Symptoms:Dyspnea; Risk                 Factors:Hypertension. CKD.  Sonographer:    Jonelle Sidle Dance Referring Phys: [id number] Francesca Jewett  Sonographer Comments: Echo performed with patient supine and on artificial respirator and patient is morbidly obese. Image acquisition challenging due to patient body habitus. IMPRESSIONS  1. There is large circumferential pericardial effusion with signs of hemodynamic compromise, a pericardiocentesis is recommended. The attending physician Dr Harrell Gave was notified.  2. Left ventricular ejection fraction, by estimation, is 60 to 65%. The left ventricle has normal function. The left ventricle has no regional wall motion abnormalities. Left ventricular diastolic function could not be evaluated.  3. Right ventricular systolic function is normal. The right ventricular size is underfilled.  4. Left atrial size was mildly dilated.  5. Large pericardial effusion. The pericardial effusion is circumferential. Findings are consistent with cardiac tamponade.  6. The mitral valve is normal in structure. Mild mitral valve regurgitation. No evidence of mitral stenosis.  7. The aortic  valve is normal in structure. There is severe calcifcation of the aortic valve. There is severe thickening of the aortic valve. Aortic valve regurgitation is mild. Moderate aortic valve stenosis. Aortic valve mean gradient measures 28.0 mmHg.  8. There is borderline dilatation of the ascending aorta, measuring 40 mm.  9. The inferior vena cava is dilated in size with <50% respiratory variability, suggesting right atrial pressure of 15 mmHg. FINDINGS  Left Ventricle: Left ventricular ejection fraction, by estimation, is 60 to 65%. The left ventricle has normal function. The left ventricle has no regional wall motion abnormalities. The left ventricular internal cavity size was normal in size. There is  no left ventricular hypertrophy. Left ventricular diastolic function could not be evaluated due to atrial fibrillation. Left ventricular diastolic function could not be evaluated. Right Ventricle: The right ventricular size is underfilled. No increase in right ventricular wall thickness. Right ventricular systolic function is normal. Left Atrium: Left atrial size was mildly dilated. Right Atrium: Right atrial size was normal in size. Pericardium: A large pericardial effusion is present. The pericardial effusion is circumferential. There is evidence of cardiac tamponade. Mitral Valve: The mitral valve is normal in structure. There is moderate thickening of the mitral valve leaflet(s). There is moderate calcification of the mitral valve leaflet(s). Mild mitral valve regurgitation. No evidence of  mitral valve stenosis. Tricuspid Valve: The tricuspid valve is normal in structure. Tricuspid valve regurgitation is not demonstrated. No evidence of tricuspid stenosis. Aortic Valve: The aortic valve is normal in structure. There is severe calcifcation of the aortic valve. There is severe thickening of the aortic valve. Aortic valve regurgitation is mild. Aortic regurgitation PHT measures 333 msec. Moderate aortic stenosis is  present. Aortic valve mean gradient measures 28.0 mmHg. Aortic valve peak gradient measures 44.6 mmHg. Aortic valve area, by VTI measures 0.99 cm. Pulmonic Valve: The pulmonic valve was normal in structure. Pulmonic valve regurgitation is not visualized. No evidence of pulmonic stenosis. Aorta: The aortic root is normal in size and structure. There is borderline dilatation of the ascending aorta, measuring 40 mm. Venous: The inferior vena cava is dilated in size with less than 50% respiratory variability, suggesting right atrial pressure of 15 mmHg. IAS/Shunts: No atrial level shunt detected by color flow Doppler.  LEFT VENTRICLE PLAX 2D LVIDd:         4.90 cm LVIDs:         3.40 cm LV PW:         1.00 cm LV IVS:        0.90 cm LVOT diam:     1.90 cm LV SV:         57 LV SV Index:   26 LVOT Area:     2.84 cm  RIGHT VENTRICLE          IVC RV Basal diam:  3.30 cm  IVC diam: 3.00 cm TAPSE (M-mode): 2.5 cm LEFT ATRIUM              Index       RIGHT ATRIUM           Index LA diam:        4.20 cm  1.93 cm/m  RA Area:     23.50 cm LA Vol (A2C):   156.0 ml 71.75 ml/m RA Volume:   70.40 ml  32.38 ml/m LA Vol (A4C):   119.0 ml 54.73 ml/m LA Biplane Vol: 139.0 ml 63.93 ml/m  AORTIC VALVE AV Area (Vmax):    0.87 cm AV Area (Vmean):   0.95 cm AV Area (VTI):     0.99 cm AV Vmax:           334.00 cm/s AV Vmean:          227.000 cm/s AV VTI:            0.576 m AV Peak Grad:      44.6 mmHg AV Mean Grad:      28.0 mmHg LVOT Vmax:         103.05 cm/s LVOT Vmean:        76.000 cm/s LVOT VTI:          0.201 m LVOT/AV VTI ratio: 0.35 AI PHT:            333 msec  AORTA Ao Root diam: 4.00 cm Ao Asc diam:  4.00 cm MITRAL VALVE MV Area (PHT): 3.12 cm     SHUNTS MV Decel Time: 243 msec     Systemic VTI:  0.20 m MV E velocity: 116.00 cm/s  Systemic Diam: 1.90 cm MV A velocity: 69.90 cm/s MV E/A ratio:  1.66 Ena Dawley MD Electronically signed by Ena Dawley MD Signature Date/Time: 01/30/2021/4:45:35 PM    Final      Cardiac Studies   Echocardiogram 02/03/2021: 1. There is large circumferential pericardial  effusion with signs of  hemodynamic compromise, a pericardiocentesis is recommended. The attending  physician Dr Harrell Gave was notified.  2. Left ventricular ejection fraction, by estimation, is 60 to 65%. The  left ventricle has normal function. The left ventricle has no regional  wall motion abnormalities. Left ventricular diastolic function could not  be evaluated.  3. Right ventricular systolic function is normal. The right ventricular  size is underfilled.  4. Left atrial size was mildly dilated.  5. Large pericardial effusion. The pericardial effusion is  circumferential. Findings are consistent with cardiac tamponade.  6. The mitral valve is normal in structure. Mild mitral valve  regurgitation. No evidence of mitral stenosis.  7. The aortic valve is normal in structure. There is severe calcifcation  of the aortic valve. There is severe thickening of the aortic valve.  Aortic valve regurgitation is mild. Moderate aortic valve stenosis. Aortic  valve mean gradient measures 28.0  mmHg.  8. There is borderline dilatation of the ascending aorta, measuring 40  mm.  9. The inferior vena cava is dilated in size with <50% respiratory  variability, suggesting right atrial pressure of 15 mmHg.   Echocardiogram 10/09/2020: 1. No subcostal views.  2. Left ventricular ejection fraction, by estimation, is 60 to 65%. The  left ventricle has normal function. The left ventricle has no regional  wall motion abnormalities. There is mild left ventricular hypertrophy.  Left ventricular diastolic parameters  are indeterminate.  3. Right ventricular systolic function is normal. The right ventricular  size is normal.  4. Left atrial size was severely dilated.  5. The mitral valve is normal in structure. Mild mitral valve  regurgitation. No evidence of mitral stenosis. Severe mitral annular   calcification.  6. The aortic valve is normal in structure. Aortic valve regurgitation is  trivial. Moderate aortic valve stenosis.   DCCV 01/06/2021 Evaluation: Findings: Post procedure EKG shows:Sinus with mobitz 1 AVB and PACs Complications:None  After the procedure, the patient was hypotensive requiring pressor support. Was also notably overloaded on physical exam. Given tenuous status, decision was made to admit to in-patient for pressor support and IV diuresis. Plan discussed with admitting physician, Dr. Ellyn Hack and primary cardiologist Dr. Margaretann Loveless.  Patient Profile     60 y.o. male with a history of moderate AS, CKD V, HTN, gout, persistent atrial flutter, DM2, and hx of hip dislocations and recent acute on chronic diastolic CHF with acute respiratory failure>> found to be in atrial flutter. Plan was for OP DCCV>>scheduled for 01/09/2021. On arrival pt found to be severely volume overloaded, was hypotensive post procedure requiring admission. Had PEA arrest AM 02/01/2021, thought to be respiratory but echo later showed large effusion with tamponade, s/p pericardial window 01/13/2021.  Assessment & Plan    PEA arrest 01/25/2021 Acute hypoxic respiratory failure Hypotension requiring pressor support -suspect respiratory given bipap off, low O2 sats, rapid improvement post CPR and intubation -however, echo showed large pericardial effusion with evidence of tamponade. Discussed pericardiocentesis, but as patient had been anticoagulated, felt to be high risk for tap in cath lab. CT surgery consulted, underwent pericardial window with Dr. Orvan Seen 01/13/2021 -fluid cytology/pathology pending from pericardial fluid -remains intubated, on levophed for hypotension. Hemodynamics not significantly improved post window  Typical atrial flutter with variable block: with coarse flutter/fib on telemetry as well -outpatient DCCV 01/20/2021, initially converted to sinus with Mobitz 1 AV block, PACs/PVCs. Returned  to atrial flutter -based on telemetry, converted back to sinus rhythm with very long PR around  3 AM 01/27/21. However, early 3/23 returned to atrial flutter, but ventricularly rate controlled CHA2DS2/VAS Stroke Risk Points=3 -had been continued on anticoagulation given thromboembolic risk with recent cardioversion. However, anticoagulation held when tamponade found. Given pericardial window 02/02/2021, anticoagulation placed on temporary hold. Heparin restarted PM 01/30/21 -continue amiodarone, 200 mg per tube BID.  Acute on chronic diastolic heart failure CKD stage V progressed to ESRD -appreciate management per nephrology -CRRT stopped 3/21, HD removed 3L fluid 3/23, CRRT restarted 3.25 -no blood pressure room for additional medications, volume management per nephrology  Hypotension -on midodrine and levophed currently  Anemia of chronic disease -Hgb 7.6  Moderate aortic stenosis -murmur present  Urinary tract infection, chronic right thigh wound, ? coag negative staph bacteremia vs contaminant -per primary team  CRITICAL CARE Patient is critically ill with multiple organ systems affected and requires high complexity decision making. Total critical care time: 40 minutes. This time includes gathering of history, evaluation of patient's response to treatment, examination of patient, review of laboratory and imaging studies, and coordination with consultants. Greater than 50% of time spent in direct patient care.  We are in a difficult position. There is not much else to offer from a cardiac perspective. We will continue to follow his tenuous clinical situation.  For questions or updates, please contact [place] Please consult [email or web address] for contact info under     Signed, Buford Dresser, MD  01/31/2021, 9:05 AM

## 2021-01-31 NOTE — Consult Note (Signed)
Palliative Medicine Inpatient Consult Note  Reason for consult:  GOC, multiple problems , recent arrest. HPI:  Per Dr. Valeta Harms progress note 3/25 -> " 60 y/o M who presented to [facility] on 3/16 for elective cardioversion but presented with shortness of breath, leg swelling and palpitations.   The patient has known CKD IV with baseline sr cr of 5, conservative management, L arm AVF in place in anticipation of HD.  On presentation, he was found to have significant volume overload needing admission per TRH.  After cardioversion he became transiently hypotensive and briefly required neosynephrine gtt.  He was admitted and Nephrology was consulted. Hospital course notable for Proteus UTI, anemia, intolerance to initial HD (chest pain, SVT) requiring brief CVVHD and BIPAP use.  In the ICU he continued to require bipap. He was transitioned to [facility] and had one session on 3/23 with 3L removed / -750 for day.  In the early am of 3/24 the patient pulled his bipap mask off and suffered a respiratory arrest requiring intubation and one round of CPR."  3/24 left thoracotomy for pericardial window    He remains on levophed for blood pressure, intubated and sedated with CRRT.   Clinical Assessment/Goals of Care: I have reviewed medical records including EPIC notes, labs and imaging, received report from bedside RN Katie Long, assessed the patient.    I met with Brother [name] Obryant  to further discuss diagnosis prognosis, GOC, EOL wishes, disposition and options for his brother [name].   I introduced Palliative Medicine as specialized medical care for people living with serious illness. It focuses on providing relief from the symptoms and stress of a serious illness. The goal is to improve quality of life for both the patient and the family.  A detailed discussion was had today regarding advanced directives.  Concepts specific to code status, and continued aggressive hospital treatment was had.  The difference between  an aggressive medical intervention path  and a palliative comfort care path for this patient at this time was had. Values and goals of care important to patient and family were attempted to be elicited.  [name] shares that there are 7 brothers and 2 sisters and not all live locally. He is encouraging those within a few hours drive to come to see him. He states they have had no previous conversations about advance directives and there are no written advance directives. Brother Bo Teicher is planning to visit today.   We discussed [name]'s curernt state and multisystem [name] shares that [name] would not want to be on machines supporting him long term. He wants to keep full code status for this time. If his brother has a cardia arrest he would want an attempt made to resuscitate him.He will have a conversation with all of the family and then reach out to [place] to have further discussions on goals of care.   Discussed the importance of continued conversation with family and their  medical providers regarding overall plan of care and treatment options, ensuring decisions are within the context of the patients values and GOCs.   Decision Maker: Brother [name] Loveless [phone]  SUMMARY OF RECOMMENDATIONS    Code Status/Advance Care Planning: FULL CODE    Symptom Management:  Pain: fentanyl drip with prn for break through Agitation: Versed to sedate    Palliative Prophylaxis:   Constipation: colace, polyethylene glycol  Additional Recommendations (Limitations, Scope, Preferences):  Continue current treatments until family can meet and discuss and provide further guidance on goals  Psycho-social/Spiritual:   Desire for further Chaplaincy support: yes, consult placed  Additional Recommendations:  Family support and continued conversations to ensure care adheres to goals.    Prognosis: Poor s/p PEA arrest and cardiac tamponade with left thoracotomy for pericardial window, multisystem organ  dysfunction (cardiac, pulmonary, renal) on ventilator and CRRT with malnutrition.   Discharge Planning: To be determined.   Vitals with BMI 01/31/2021 01/31/2021 01/31/2021  Height - - -  Weight - - -  BMI - - -  Systolic 91 - -  Diastolic 42 - -  Pulse 56 58 57     PPS: 10%   This conversation/these recommendations were discussed with patient primary care team, Dr. Valeta Harms via secure chat.  Thank you for the opportunity to participate in the care of this patient and family.   Time In: 8:40 Time Out:9:50 Total Time: 70 minutes Greater than 50%  of this time was spent counseling and coordinating care related to the above assessment and plan.  Lindell Spar, NP [facility] Palliative Medicine Team Team Cell Phone: [phone] Please utilize secure chat with additional questions, if there is no response within 30 minutes please call the above phone number  Palliative Medicine Team providers are available by phone from 7am to 7pm daily and can be reached through the team cell phone.  Should this patient require assistance outside of these hours, please call the patient's attending physician.
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
## 2021-01-31 NOTE — Progress Notes (Signed)
     [address]       [place] [address]             [phone]       No events  Minimal drain output  Would keep drain until extubated

## 2021-01-31 NOTE — Progress Notes (Signed)
[place] for heparin Indication: atrial fibrillation  Allergies  Allergen Reactions  . Penicillins Swelling, Rash and Other (See Comments)    Arms and eyes swell & skin breaks out  Has patient had a PCN reaction causing immediate rash, facial/tongue/throat swelling, SOB or lightheadedness with hypotension: Yes Has patient had a PCN reaction causing severe rash involving mucus membranes or skin necrosis: Yes Has patient had a PCN reaction that required hospitalization: No Has patient had a PCN reaction occurring within the last 10 years: No If all of the above answers are "NO", then may proceed with Cephalosporin use.   . Atorvastatin     Muscle pain in legs  . Rosuvastatin     Muscle pain in legs    Patient Measurements: Height: '5\' 9"'$  (175.3 cm) Weight: 104 kg (229 lb 4.5 oz) IBW/kg (Calculated) : 70.7 Heparin Dosing Weight: 95 kg  Vital Signs: Temp: 96 F (35.6 C) (03/26 0338) Temp Source: Axillary (03/26 0338) BP: 104/56 (03/26 0400) Pulse Rate: 67 (03/26 0400)  Labs: Recent Labs    01/28/21 0900 01/28/21 1216 01/28/21 1554 01/15/2021 0122 01/28/2021 0359 01/09/2021 0408 01/06/2021 0515 01/28/2021 1631 01/07/2021 2003 01/30/21 0403 01/30/21 1600 01/31/21 0323  HGB  --   --    < > 8.5*   < >  --  7.5*  --  7.3* 7.6*  --   --   HCT  --   --    < > 26.5*   < >  --  23.2*  --  22.0* 23.3*  --   --   PLT  --   --    < > 239  --   --  240  --  235 249  --   --   APTT >200* 61*  --   --   --   --   --   --   --   --   --  67*  HEPARINUNFRC  --   --   --   --   --   --   --   --   --   --   --  1.86*  CREATININE  --   --    < > 3.44*  --   --  3.51*   < > 3.84* 4.37* 3.72* 2.73*  TROPONINIHS  --   --   --  87*  --  111*  --   --   --   --   --   --    < > = values in this interval not displayed.    Estimated Creatinine Clearance: 33.8 mL/min (A) (by C-G formula based on SCr of 2.73 mg/dL (H)).  Assessment: 60 y.o. male with h/o Afib,  Eliquis on hold, for heparin.  APTT within goal range  Goal of Therapy:  Heparin level 0.3-.0.5 units/mL APTT 66-85 sec Monitor platelets by anticoagulation protocol: Yes   Plan:  Continue Heparin at current rate   Phillis Knack, PharmD, BCPS

## 2021-01-31 NOTE — Progress Notes (Signed)
NAME:  Mark [name], MRN:  [id number], DOB:  05/29/1961, LOS: 63 ADMISSION DATE:  01/25/2021, CONSULTATION DATE: 3/19 REFERRING MD:  Dr. Erlinda Hong, CHIEF COMPLAINT:  TRH    History of Present Illness:  60 y/o M who presented to [facility] on 3/16 for elective cardioversion but presented with shortness of breath, leg swelling and palpitations.   The patient has known CKD IV with baseline sr cr of 5, conservative management, L arm AVF in place in anticipation of HD.  On presentation, he was found to have significant volume overload needing admission per TRH.  After cardioversion he became transiently hypotensive and briefly required neosynephrine gtt.  He was admitted and Nephrology was consulted. Hospital course notable for Proteus UTI, anemia, intolerance to initial HD (chest pain, SVT) requiring brief CVVHD and BIPAP use.  In the ICU he continued to require bipap. He was transitioned to [facility] and had one session on 3/23 with 3L removed / -750 for day.  In the early am of 3/24 the patient pulled his bipap mask off and suffered a respiratory arrest requiring intubation and one round of CPR.   Pertinent  Medical History  CKD IV - HD started this admit (01/16/2021) HTN HLD Chronic Diastolic CHF  Moderate AS Atrial Flutter - on Eliquis  Obesity  Anemia of Chronic Disease   Significant Hospital Events: Including procedures, antibiotic start and stop dates in addition to other pertinent events    3/16 Admit after cardioversion, SOB, leg swelling / palpitations prior to presentation   3/19 PCCM consulted   3/20 CRRT started  3/21 Improved mental status, wore BiPAP overnight. Staph epidermis growing from [facility]. UC growing proteus, abx changed from ceftriaxone to cefepime.  Fever 100.2, -3.6L in last 24 hours.    3/21 CVVHD stopped.  3/23 Tolerated iHD  3/24 Respiratory arrest, PEA, (pulled bipap off), intubated, re-cultured, ECHO tamponade, Pericardial window, Pericardial drain 28Fr by Julien Girt  3/25  palliative care consulted   Interim History / Subjective:   Intubated on life support, pressors, pericardial drain. Now on cvvhd   Objective   Blood pressure (!) 84/47, pulse (!) 56, temperature (!) 96 F (35.6 C), temperature source Axillary, resp. rate 18, height '5\' 9"'$  (1.753 m), weight 108.1 kg, SpO2 99 %.    Vent Mode: PRVC FiO2 (%):  [30 %-40 %] 30 % Set Rate:  [18 bmp] 18 bmp Vt Set:  [560 mL] 560 mL PEEP:  [5 cmH20] 5 cmH20 Plateau Pressure:  [18 cmH20-21 cmH20] 19 cmH20 IPAP 20 EPAP 8  Intake/Output Summary (Last 24 hours) at 01/31/2021 [id number] Last data filed at 01/31/2021 [id number] Gross per 24 hour  Intake 2771.9 ml  Output 3930 ml  Net -1158.1 ml   Filed Weights   01/20/2021 0455 01/30/21 0315 01/31/21 0456  Weight: 102.3 kg 104 kg 108.1 kg    Examination: General: chronically ill appearing male, intubated on life support  HEENT: ETT in place  Neuro: sedated on propofol and fent, he opens eyes to voice, appears comfortable  CV: irregular, s1 s2  PULM:  BL vented breaths  GI: soft, nt nd  Extremities: no edema  Skin: no rash  VVS: left av fistula, thrill       Resolved Hospital Problem list     Assessment & Plan:   PEA Arrest Cardiac Tamponade Suspect respiratory driven as pulled bipap off + tamponade P: Drain in place Supportive care post arrest   Acute Hypercarbic Respiratory Failure in setting of Acute Diastolic CHF  On mechanical ventilation post cardiac arrest   P: Adult mechanical vent support  PEEP/Fio2 wean as tolerated  Goal sats >90 PAD guidelines  RASS -1  CKD V / New ESRD  HD initiated this admit, did not tolerate initial iHD due to chest pain on 3/19, then transitioned to CVVHD.  Repeat iHD on 3/23.   P: CVVHD per nephrology   Acute Exacerbation of dCHF Atrial Fibrillation / Flutter  Chronic Hypotension Acute on Chronic Diastolic HF Moderate Aortic Stenosis  S/P DCCV 3/16 with brief hypotension requiring neo, chest pain post  procedure. On eliquis previously for AFlutter.  P: Continue midodrine  Continue levophed  MAP goal 67mHG   Macrocytic Anemia  Reported red stool, hx of gastric ulcer. S/P PRBC 3/19, 3/22. Hgb 6.5 (3/22), transfused, HGB up to 8.3 post transfusion (3/22), now 7.1 (3/23) P: Follow H&H Conservative transfusion threshold hgb <7   BPH - flomax - follow UOP  - bladder scans PRN   Proteus UTI Chronic Hydradenitis / L Groin Wound, Small Wound on Right Thigh CNS Bacteremia vs Contaminant with 2/4 bottles Leukocytosis CCS evaluated groin wound, no rec's for I&D or CT.   -appreciate CCS > rec's for topical clinda  P: Cefepime  Vancomycin   Left Shoulder Pain  - op follow up    Moderate Protein Calorie Malnutrition Poor intake since admit with BiPAP use / AMS  P: Continue TF   GOC: Appreciate palliative care input   Best practice (evaluated daily)  Diet:  TF Pain/Anxiety/Delirium protocol (if indicated): No VAP protocol (if indicated): Not indicated DVT prophylaxis: Systemic AC GI prophylaxis: PPI Glucose control:  Basal insulin No Central venous access:  Yes, and it is still needed Arterial line:  N/A Foley:  N/A Mobility:  bed rest  PT consulted: N/A Last date of multidisciplinary goals of care discussion: Palliative care meeting 01/31/2021 Code Status:  full code Disposition: ICU   This patient is critically ill with multiple organ system failure; which, requires frequent high complexity decision making, assessment, support, evaluation, and titration of therapies. This was completed through the application of advanced monitoring technologies and extensive interpretation of multiple databases. During this encounter critical care time was devoted to patient care services described in this note for 36 minutes.  BGarner Nash DO [facility] 01/31/2021 10:36 AM

## 2021-01-31 NOTE — Progress Notes (Addendum)
[place] for IV heparin Indication: atrial fibrillation  Allergies  Allergen Reactions  . Penicillins Swelling, Rash and Other (See Comments)    Arms and eyes swell & skin breaks out  Has patient had a PCN reaction causing immediate rash, facial/tongue/throat swelling, SOB or lightheadedness with hypotension: Yes Has patient had a PCN reaction causing severe rash involving mucus membranes or skin necrosis: Yes Has patient had a PCN reaction that required hospitalization: No Has patient had a PCN reaction occurring within the last 10 years: No If all of the above answers are "NO", then may proceed with Cephalosporin use.   . Atorvastatin     Muscle pain in legs  . Rosuvastatin     Muscle pain in legs    Patient Measurements: Height: '5\' 9"'$  (175.3 cm) Weight: 108.1 kg (238 lb 5.1 oz) IBW/kg (Calculated) : 70.7 Heparin Dosing Weight: 95 kg  Vital Signs: Temp: 97.7 F (36.5 C) (03/26 1500) Temp Source: Axillary (03/26 1500) BP: 103/58 (03/26 1500) Pulse Rate: 69 (03/26 1500)  Labs: Recent Labs    02/05/2021 0122 01/31/2021 0359 01/08/2021 0408 01/11/2021 0515 01/22/2021 2003 01/30/21 0403 01/30/21 1600 01/31/21 0323 01/31/21 1135 01/31/21 1346  HGB 8.5*   < >  --    < > 7.3* 7.6*  --   --   --  7.1*  HCT 26.5*   < >  --    < > 22.0* 23.3*  --   --   --  23.1*  PLT 239  --   --    < > 235 249  --   --   --  189  APTT  --   --   --   --   --   --   --  67* 79*  --   HEPARINUNFRC  --   --   --   --   --   --   --  1.86* 1.38*  --   CREATININE 3.44*  --   --    < > 3.84* 4.37* 3.72* 2.73*  --   --   TROPONINIHS 87*  --  111*  --   --   --   --   --   --   --    < > = values in this interval not displayed.    Estimated Creatinine Clearance: 34.4 mL/min (A) (by C-G formula based on SCr of 2.73 mg/dL (H)).   Medical History: Past Medical History:  Diagnosis Date  . Anemia    labs today indicate (09/27/11)  . Arthritis    right knee  .  Bleeding ulcer    hx of, required 6 units blood  . Blood transfusion    6 units with ulcer repair surgery  . Chronic kidney disease    stage IV, has fistula, never started diaylsis  . Complication of anesthesia    little slow to wake up with last surgery  . Dyspnea    with exertion  . Gout   . Hip dislocation, left ([place]) 10/09/2020  . Hypertension   . Osteoporosis   . Pneumonia     Medications:  Infusions:  .  prismasol BGK 4/2.5 300 mL/hr at 01/31/21 0338  . ceFEPime (MAXIPIME) IV Stopped (01/31/21 0926)  . feeding supplement (VITAL 1.5 CAL) 1,000 mL (01/31/21 1152)  . fentaNYL infusion INTRAVENOUS 100 mcg/hr (01/31/21 1500)  . heparin 1,650 Units/hr (01/31/21 1500)  . norepinephrine (LEVOPHED) Adult infusion 9 mcg/min (01/31/21  1500)  . prismasol BGK 2/2.5 dialysis solution 1,500 mL/hr at 01/31/21 1425  . prismasol BGK 2/2.5 replacement solution 500 mL/hr at 01/31/21 0810  . propofol (DIPRIVAN) infusion Stopped (01/31/21 0858)  . vancomycin Stopped (01/31/21 0054)    Assessment: 60 yo male on chronic anticoagulation with apixaban, was held for pericardial window last night.  Pharmacy asked to resume IV heparin 24 hrs after procedure - so will restart at 1830 pm tonight.  Previously dosing heparin from aPTT since heparin levels falsely elevated from previous apixaban.  APTT remains  Goal of Therapy:  Heparin level 0.3-.0.5 APTT 66-85 (lower end of 66-102 seconds goal range) Monitor platelets by anticoagulation protocol: Yes   Plan:  Continue IV heparin at current rate. Daily heparin level and CBC. Will target lower end of therapeutic range given window.  [place] Crane, Roylene Reason, BCCP Clinical Pharmacist  01/31/2021 3:08 PM   [facility] pharmacy phone numbers are listed on [email or web address]

## 2021-02-01 ENCOUNTER — Inpatient Hospital Stay (HOSPITAL_COMMUNITY): Payer: 59

## 2021-02-01 DIAGNOSIS — I5033 Acute on chronic diastolic (congestive) heart failure: Secondary | ICD-10-CM | POA: Diagnosis not present

## 2021-02-01 DIAGNOSIS — R0603 Acute respiratory distress: Secondary | ICD-10-CM | POA: Diagnosis not present

## 2021-02-01 DIAGNOSIS — E8779 Other fluid overload: Secondary | ICD-10-CM | POA: Diagnosis not present

## 2021-02-01 LAB — RENAL FUNCTION PANEL
Albumin: 1.9 g/dL — ABNORMAL LOW (ref 3.5–5.0)
Albumin: 1.7 g/dL — ABNORMAL LOW (ref 3.5–5.0)
Anion gap: 8 (ref 5–15)
Anion gap: 7 (ref 5–15)
BUN: 37 mg/dL — ABNORMAL HIGH (ref 8–23)
BUN: 32 mg/dL — ABNORMAL HIGH (ref 8–23)
CO2: 25 mmol/L (ref 22–32)
CO2: 25 mmol/L (ref 22–32)
Calcium: 11.6 mg/dL — ABNORMAL HIGH (ref 8.9–10.3)
Calcium: 11.5 mg/dL — ABNORMAL HIGH (ref 8.9–10.3)
Chloride: 99 mmol/L (ref 98–111)
Chloride: 101 mmol/L (ref 98–111)
Creatinine, Ser: 1.93 mg/dL — ABNORMAL HIGH (ref 0.61–1.24)
Creatinine, Ser: 1.8 mg/dL — ABNORMAL HIGH (ref 0.61–1.24)
GFR, Estimated: 39 mL/min — ABNORMAL LOW (ref >60)
GFR, Estimated: 42 mL/min — ABNORMAL LOW (ref >60)
Glucose, Bld: 181 mg/dL — ABNORMAL HIGH (ref 70–99)
Glucose, Bld: 215 mg/dL — ABNORMAL HIGH (ref 70–99)
Phosphorus: 4.4 mg/dL (ref 2.5–4.6)
Phosphorus: 4.4 mg/dL (ref 2.5–4.6)
Potassium: 4.1 mmol/L (ref 3.5–5.1)
Potassium: 3.9 mmol/L (ref 3.5–5.1)
Sodium: 132 mmol/L — ABNORMAL LOW (ref 135–145)
Sodium: 133 mmol/L — ABNORMAL LOW (ref 135–145)

## 2021-02-01 LAB — CBC
HCT: 22.6 % — ABNORMAL LOW (ref 39.0–52.0)
Hemoglobin: 6.9 g/dL — CL (ref 13.0–17.0)
MCH: 31.7 pg (ref 26.0–34.0)
MCHC: 30.5 g/dL (ref 30.0–36.0)
MCV: 103.7 fL — ABNORMAL HIGH (ref 80.0–100.0)
Platelets: 187 10*3/uL (ref 150–400)
RBC: 2.18 MIL/uL — ABNORMAL LOW (ref 4.22–5.81)
RDW: 18.5 % — ABNORMAL HIGH (ref 11.5–15.5)
WBC: 38 10*3/uL — ABNORMAL HIGH (ref 4.0–10.5)
nRBC: 6 % — ABNORMAL HIGH (ref 0.0–0.2)

## 2021-02-01 LAB — GLUCOSE, CAPILLARY
Glucose-Capillary: 115 mg/dL — ABNORMAL HIGH (ref 70–99)
Glucose-Capillary: 159 mg/dL — ABNORMAL HIGH (ref 70–99)
Glucose-Capillary: 161 mg/dL — ABNORMAL HIGH (ref 70–99)
Glucose-Capillary: 162 mg/dL — ABNORMAL HIGH (ref 70–99)
Glucose-Capillary: 188 mg/dL — ABNORMAL HIGH (ref 70–99)
Glucose-Capillary: 200 mg/dL — ABNORMAL HIGH (ref 70–99)

## 2021-02-01 LAB — PREPARE RBC (CROSSMATCH)

## 2021-02-01 LAB — HEMOGLOBIN AND HEMATOCRIT, BLOOD
HCT: 22 % — ABNORMAL LOW (ref 39.0–52.0)
HCT: 23.3 % — ABNORMAL LOW (ref 39.0–52.0)
Hemoglobin: 6.7 g/dL — CL (ref 13.0–17.0)
Hemoglobin: 7.2 g/dL — ABNORMAL LOW (ref 13.0–17.0)

## 2021-02-01 LAB — APTT: aPTT: 56 seconds — ABNORMAL HIGH (ref 24–36)

## 2021-02-01 LAB — HEPARIN LEVEL (UNFRACTIONATED): Heparin Unfractionated: 1.28 IU/mL — ABNORMAL HIGH (ref 0.30–0.70)

## 2021-02-01 LAB — MAGNESIUM: Magnesium: 2.5 mg/dL — ABNORMAL HIGH (ref 1.7–2.4)

## 2021-02-01 MED ORDER — VASOPRESSIN 20 UNITS/100 ML INFUSION FOR SHOCK
0.0000 [IU]/min | INTRAVENOUS | Status: DC
  Administered 2021-02-01: 0.02 [IU]/min via INTRAVENOUS
  Administered 2021-02-02: 0.03 [IU]/min via INTRAVENOUS
  Administered 2021-02-02 (×2): 0.04 [IU]/min via INTRAVENOUS
  Administered 2021-02-03: 0.02 [IU]/min via INTRAVENOUS
  Administered 2021-02-05 – 2021-02-06 (×2): 0.04 [IU]/min via INTRAVENOUS
  Filled 2021-02-01 (×7): qty 100

## 2021-02-01 MED ORDER — SODIUM CHLORIDE 0.9% IV SOLUTION: Freq: Once | INTRAVENOUS | Status: AC

## 2021-02-01 MED ORDER — SODIUM CHLORIDE 0.9 % IV SOLN
INTRAVENOUS | Status: DC | PRN
  Administered 2021-02-02: 1000 mL via INTRAVENOUS
  Administered 2021-02-05 (×2): 500 mL via INTRAVENOUS

## 2021-02-01 NOTE — Progress Notes (Signed)
NAME:  Mark [name], MRN:  [id number], DOB:  [birth year], LOS: 6 ADMISSION DATE:  01/28/2021, CONSULTATION DATE: 3/19 REFERRING MD:  Dr. Erlinda Hong, CHIEF COMPLAINT:  TRH    History of Present Illness:  60 y/o M who presented to [facility] on 3/16 for elective cardioversion but presented with shortness of breath, leg swelling and palpitations.   The patient has known CKD IV with baseline sr cr of 5, conservative management, L arm AVF in place in anticipation of HD.  On presentation, he was found to have significant volume overload needing admission per TRH.  After cardioversion he became transiently hypotensive and briefly required neosynephrine gtt.  He was admitted and Nephrology was consulted. Hospital course notable for Proteus UTI, anemia, intolerance to initial HD (chest pain, SVT) requiring brief CVVHD and BIPAP use.  In the ICU he continued to require bipap. He was transitioned to [facility] and had one session on 3/23 with 3L removed / -750 for day.  In the early am of 3/24 the patient pulled his bipap mask off and suffered a respiratory arrest requiring intubation and one round of CPR.   Pertinent  Medical History  CKD IV - HD started this admit (01/24/2021) HTN HLD Chronic Diastolic CHF  Moderate AS Atrial Flutter - on Eliquis  Obesity  Anemia of Chronic Disease   Significant Hospital Events: Including procedures, antibiotic start and stop dates in addition to other pertinent events    3/16 Admit after cardioversion, SOB, leg swelling / palpitations prior to presentation   3/19 PCCM consulted   3/20 CRRT started  3/21 Improved mental status, wore BiPAP overnight. Staph epidermis growing from [facility]. UC growing proteus, abx changed from ceftriaxone to cefepime.  Fever 100.2, -3.6L in last 24 hours.    3/21 CVVHD stopped.  3/23 Tolerated iHD  3/24 Respiratory arrest, PEA, (pulled bipap off), intubated, re-cultured, ECHO tamponade, Pericardial window, Pericardial drain 28Fr by Julien Girt  3/25  palliative care consulted   3/27 CVVHD, pressors levo + vaso   Interim History / Subjective:   Critically ill, intubated on life support, febrile. On abx, rising WBC    Objective   Blood pressure 107/70, pulse 90, temperature (!) 100.7 F (38.2 C), temperature source Oral, resp. rate (!) 22, height '5\' 9"'$  (1.753 m), weight 108.1 kg, SpO2 100 %.    Vent Mode: PRVC FiO2 (%):  [30 %] 30 % Set Rate:  [18 bmp] 18 bmp Vt Set:  [560 mL] 560 mL PEEP:  [5 cmH20] 5 cmH20 Plateau Pressure:  [17 cmH20-20 cmH20] 18 cmH20 IPAP 20 EPAP 8  Intake/Output Summary (Last 24 hours) at 02/01/2021 1036 Last data filed at 02/01/2021 1000 Gross per 24 hour  Intake 2820.05 ml  Output 3812 ml  Net -991.95 ml   Filed Weights   01/20/2021 0455 01/30/21 0315 01/31/21 0456  Weight: 102.3 kg 104 kg 108.1 kg    Examination: General: critically ill, intubated on life support   HEENT: ETT in place Neuro: sedated on mech vent, opens eyes to voice  CV: Irregularly, irregular, s1 s2  PULM: BL vented breaths  GI: soft nt nd  Extremities: no edema  Skin: no rash  VVS: left AV fistula       Resolved Hospital Problem list     Assessment & Plan:   PEA Arrest Cardiac Tamponade Suspect respiratory driven as pulled bipap off + tamponade P: Drain remain in place until off vent   Acute Hypercarbic Respiratory Failure in setting of Acute Diastolic  CHF On mechanical ventilation post cardiac arrest   P: Adult mech vent  PEEP/Fio2 Goal Sats >90  PAD guidelines  RASS -1  CKD V / New ESRD  HD initiated this admit, did not tolerate initial iHD due to chest pain on 3/19, then transitioned to CVVHD.  Repeat iHD on 3/23.   P: CVVHD   Acute Exacerbation of dCHF Atrial Fibrillation / Flutter  Chronic Hypotension Acute on Chronic Diastolic HF Moderate Aortic Stenosis  S/P DCCV 3/16 with brief hypotension requiring neo, chest pain post procedure. On eliquis previously for AFlutter.  P: Continue midodrine   Continue levophed Goal MAP 65 mmhg   Macrocytic Anemia  Reported red stool, hx of gastric ulcer. S/P PRBC 3/19, 3/22. Hgb 6.5 (3/22), transfused, HGB up to 8.3 post transfusion (3/22), now 7.1 (3/23) P: Follow H&H transfus X1 today  No active bleeding   BPH - flomax, foley out, follow uop PRN bladder scans   Septic Shock  Proteus UTI Chronic Hydradenitis / L Groin Wound, Small Wound on Right Thigh CNS Bacteremia vs Contaminant with 2/4 bottles Leukocytosis -- worsening  CCS evaluated groin wound, no rec's for I&D or CT.   -appreciate CCS > rec's for topical clinda  P: Pressor requirements increasing  CT Abdomen and pelvis  Repeat CXR   Left Shoulder Pain  - op follow up  Moderate Protein Calorie Malnutrition Poor intake since admit with BiPAP use / AMS  P: Continue TF   GOC: Ongoing palliative care needs   Best practice (evaluated daily)  Diet:  TF Pain/Anxiety/Delirium protocol (if indicated): No VAP protocol (if indicated): Not indicated DVT prophylaxis: Systemic AC GI prophylaxis: PPI Glucose control:  Basal insulin No Central venous access:  Yes, and it is still needed Arterial line:  N/A Foley:  N/A Mobility:  bed rest  PT consulted: N/A Last date of multidisciplinary goals of care discussion: Palliative care meeting 01/31/2021 Code Status:  full code Disposition: ICU   This patient is critically ill with multiple organ system failure; which, requires frequent high complexity decision making, assessment, support, evaluation, and titration of therapies. This was completed through the application of advanced monitoring technologies and extensive interpretation of multiple databases. During this encounter critical care time was devoted to patient care services described in this note for 32 minutes.  Garner Nash, DO [facility] 02/01/2021 10:36 AM

## 2021-02-01 NOTE — Progress Notes (Signed)
Transported pt to CT scan and back without complications.

## 2021-02-01 NOTE — Progress Notes (Signed)
eLink Physician-Brief Progress Note Patient Name: Mark [name] DOB: 09/23/1961 MRN: [id number]   Date of Service  02/01/2021  HPI/Events of Note  Hg 6.9, was 7.1. no active bleeding. Fluid overload/ Cardiac arrest-cardiac tamponade /on CVVHD.  Macrocytic Anemia  Reported red stool, hx of gastric ulcer. S/P PRBC 3/19, 3/22. Hgb 6.5 (3/22), transfused, HGB up to 8.3 post transfusion (3/22), now 7.1 (3/23) P: Follow H&H Conservative transfusion threshold hgb <7    eICU Interventions  Would avoid transfusion. Will follow up Hg /hct in AM and if further dropping to transfuse. Avoiding fluid overload further.      Intervention Category Intermediate Interventions: Diagnostic test evaluation  [name] 02/01/2021, 4:43 AM

## 2021-02-01 NOTE — Progress Notes (Signed)
eLink Physician-Brief Progress Note Patient Name: Mark [name] DOB: [birth year] MRN: [id number]   Date of Service  02/01/2021  HPI/Events of Note  CODE BLUE asystole, ongoing ACLS  High peak pressure on vent prior to event Difficult to ventilate with decreased bilateral breath sounds ROSC after 1 epi  eICU Interventions  MD now at bedside assessing airway Mucus plug removed, peak pressure now at baseline 35 Stat CXR ordered     Intervention Category Major Interventions: Code management / supervision  Judd Lien 02/01/2021, 11:56 PM

## 2021-02-01 NOTE — Progress Notes (Signed)
Shift events: CT scan, new CRRT filter, started vasopressin to decrease levophed requirements. Removed foley.   N: opens eyes to command and to voice, BLE able to wiggle toes slightly to command, BUE non-purposeful movements. Afebrile, warmed on prismaflex and bair hugger. Sedated with fentanyl gtt and required one prn bolus 27mg.   R: ETT, clear/dim lungs, moderate thick/tan secretions.   CV: Aflutter, rate 50-60. BP MAP goal greater than 65 met with levophed and vaso. Chest tube with small serosang output. Heparin gtt remains on, no titrations made per MD and pharm. 1prbc transfused today for hgb 6.7.  GI: NGT infusing tube feeds at goal. No BM, stool regimen given. BG covered with SS insulin.   GU: scant UOP, foley removed for infection prevention. CRRT running wihtout issues. Removed for approx. 90 minutes for CT scan. Switched from M150 filter to M100 filter with MD okay for availability. Ending shift positive due to increasing pressor requirements.    Brothers at bedside. No labs of concern. Recheck hbg 7.2.   Sufyaan Palma RN

## 2021-02-01 NOTE — Progress Notes (Signed)
[place] for IV heparin Indication: atrial fibrillation  Allergies  Allergen Reactions  . Penicillins Swelling, Rash and Other (See Comments)    Arms and eyes swell & skin breaks out  Has patient had a PCN reaction causing immediate rash, facial/tongue/throat swelling, SOB or lightheadedness with hypotension: Yes Has patient had a PCN reaction causing severe rash involving mucus membranes or skin necrosis: Yes Has patient had a PCN reaction that required hospitalization: No Has patient had a PCN reaction occurring within the last 10 years: No If all of the above answers are "NO", then may proceed with Cephalosporin use.   . Atorvastatin     Muscle pain in legs  . Rosuvastatin     Muscle pain in legs    Patient Measurements: Height: '5\' 9"'$  (175.3 cm) Weight: 108.1 kg (238 lb 5.1 oz) IBW/kg (Calculated) : 70.7 Heparin Dosing Weight: 95 kg  Vital Signs: Temp: 99.2 F (37.3 C) (03/27 1152) Temp Source: Axillary (03/27 1152) BP: 111/65 (03/27 1200) Pulse Rate: 74 (03/27 1200)  Labs: Recent Labs    01/30/21 0403 01/30/21 1600 01/31/21 0323 01/31/21 1135 01/31/21 1346 01/31/21 1549 02/01/21 0355 02/01/21 0928  HGB 7.6*  --   --   --  7.1*  --  6.9* 6.7*  HCT 23.3*  --   --   --  23.1*  --  22.6* 22.0*  PLT 249  --   --   --  189  --  187  --   APTT  --   --  67* 79*  --   --  56*  --   HEPARINUNFRC  --   --  1.86* 1.38*  --   --  1.28*  --   CREATININE 4.37*   < > 2.73*  --   --  2.12* 1.93*  --    < > = values in this interval not displayed.    Estimated Creatinine Clearance: 48.7 mL/min (A) (by C-G formula based on SCr of 1.93 mg/dL (H)).   Medical History: Past Medical History:  Diagnosis Date  . Anemia    labs today indicate (09/27/11)  . Arthritis    right knee  . Bleeding ulcer    hx of, required 6 units blood  . Blood transfusion    6 units with ulcer repair surgery  . Chronic kidney disease    stage IV, has  fistula, never started diaylsis  . Complication of anesthesia    little slow to wake up with last surgery  . Dyspnea    with exertion  . Gout   . Hip dislocation, left ([place]) 10/09/2020  . Hypertension   . Osteoporosis   . Pneumonia     Medications:  Infusions:  .  prismasol BGK 4/2.5 300 mL/hr at 01/31/21 2046  . ceFEPime (MAXIPIME) IV Stopped (02/01/21 1104)  . feeding supplement (VITAL 1.5 CAL) 1,000 mL (01/31/21 1152)  . fentaNYL infusion INTRAVENOUS 100 mcg/hr (02/01/21 1200)  . heparin 1,650 Units/hr (02/01/21 1200)  . norepinephrine (LEVOPHED) Adult infusion 20 mcg/min (02/01/21 1200)  . prismasol BGK 2/2.5 dialysis solution 1,500 mL/hr at 02/01/21 1057  . prismasol BGK 2/2.5 replacement solution 500 mL/hr at 02/01/21 0028  . propofol (DIPRIVAN) infusion Stopped (01/31/21 0858)  . vancomycin Stopped (01/31/21 2308)  . vasopressin 0.02 Units/min (02/01/21 1200)    Assessment: 60 yo male on chronic anticoagulation with apixaban, was held for pericardial window last night.  Pharmacy asked to resume IV heparin 24  hrs after procedure - so will restart at 1830 pm tonight.  Previously dosing heparin from aPTT since heparin levels falsely elevated from previous apixaban.  APTT is below goal this AM.  No overt bleeding noted, but Hgb continues to drift down.  Goal of Therapy:  Heparin level 0.3-.0.5 APTT 66-85 (lower end of 66-102 seconds goal range) Monitor platelets by anticoagulation protocol: Yes   Plan:  Discussed with Dr. Valeta Harms.  Given dropping Hgb (getting PRBCs), will not uptitrate heparin for now.  Continue at 1650 units/hr. Will re-discuss plan in AM with team.  [place] Crane, Vena Austria, BCPS, BCCP Clinical Pharmacist  02/01/2021 12:22 PM   [facility] pharmacy phone numbers are listed on [email or web address]

## 2021-02-01 NOTE — Progress Notes (Signed)
[facility] NEPHROLOGY PROGRESS NOTE  Assessment/ Plan: Pt is a 60 y.o. yo male  With PMH of HTN, DM, HLD, obesity, CKD stage V baseline creatinine level around 5 admitted with acute diastolic CHF.  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  Started HD 3/19 however around 45 minutes into the treatment patient developed chest pain, tachycardia and worsening hypoxia.  Rapid response called and later patient was transferred to ICU.  He is grossly volume overload, systolic blood pressure around 90s and req'ed BiPAP.  Unable to ultrafiltrate with IHD therefore started CRRT on 3/19 mainly for ultrafiltration and azotemia. CRRT completed on 3/22. S/P IHD 3/23 3L UF -Continue with CRRT (Access: Right IJ temp line). Goal is net neg 50-100cc/hr.  Appreciate assistance from palliative care -He has a left radiocephalic fistula that was created in October, 2017 by Dr. Scot Dock that is mature, able to use for IHD.  Cardiac arrest, PEA 3/24 -one round of cpr and epi. Possibly respiratory/hypoxia driven  Shock -pressor support per CCM, on midodrine as well  Acute exacerbation of diastolic heart failure:  -Started CRRT mainly for ultrafiltration. UF with HD, see above  Pericardial effusion w/ tamponade -s/p pericardial window with thoracotomy 3/24   Anemia of chronic disease: Iron deficiency therefore ordered IV iron and ESA on 3/17.  Continue ESA.  Transfuse for hgb <7, hgb 6.9 today, per primary  Secondary hyperparathyroidism: phos controlled with rrt  Aflutter -cardiology following.  Was off a/c for surgery but now on heparin drip  Subjective: Seen and examined in ICU. On CRRT, on levophed, fio30%, net neg ~1.3L. Palliative care on board Objective Vital signs in last 24 hours: Vitals:   02/01/21 0630 02/01/21 0645 02/01/21 0800 02/01/21 0803  BP:   108/71   Pulse: 87 86 91 83  Resp: 10 20 18 20   Temp:   (!) 100.7 F (38.2 C)    TempSrc:   Oral   SpO2: 98% 99% 100% 99%  Weight:      Height:       Weight change:   Intake/Output Summary (Last 24 hours) at 02/01/2021 0924 Last data filed at 02/01/2021 0800 Gross per 24 hour  Intake 2668.48 ml  Output 3981 ml  Net -1312.52 ml       Labs: Basic Metabolic Panel: Recent Labs  Lab 01/31/21 0323 01/31/21 1549 02/01/21 0355  NA 132* 135 132*  K 3.9 4.1 4.1  CL 100 101 99  CO2 25 28 25   GLUCOSE 229* 175* 181*  BUN 55* 42* 37*  CREATININE 2.73* 2.12* 1.93*  CALCIUM 10.9* 11.5* 11.6*  PHOS 4.6 4.7* 4.4   Liver Function Tests: Recent Labs  Lab 01/14/2021 0122 01/28/2021 0515 01/31/21 0323 01/31/21 1549 02/01/21 0355  AST 35  --   --   --   --   ALT 17  --   --   --   --   ALKPHOS 85  --   --   --   --   BILITOT 0.9  --   --   --   --   PROT 7.8  --   --   --   --   ALBUMIN 2.4*   < > 1.8* 1.9* 1.9*   < > = values in this interval not displayed.   No results for input(s): LIPASE, AMYLASE in the last 168 hours. No results for input(s): AMMONIA in the last 168 hours. CBC: Recent Labs  Lab  01/18/2021 0430 01/19/2021 0808 02/03/2021 0515 01/24/2021 2003 01/30/21 0403 01/31/21 1346 02/01/21 0355  WBC 21.3*   < > 25.2* 22.1* 26.2* 36.3* 38.0*  NEUTROABS 18.4*  --   --   --   --   --   --   HGB 7.1*   < > 7.5* 7.3* 7.6* 7.1* 6.9*  HCT 22.3*   < > 23.2* 22.0* 23.3* 23.1* 22.6*  MCV 100.0   < > 97.5 97.3 98.3 103.1* 103.7*  PLT 278   < > 240 235 249 189 187   < > = values in this interval not displayed.   Cardiac Enzymes: No results for input(s): CKTOTAL, CKMB, CKMBINDEX, TROPONINI in the last 168 hours. CBG: Recent Labs  Lab 01/31/21 1545 01/31/21 2005 02/01/21 0011 02/01/21 0407 02/01/21 0738  GLUCAP 161* 166* 115* 188* 162*    Iron Studies:  No results for input(s): IRON, TIBC, TRANSFERRIN, FERRITIN in the last 72 hours. Studies/Results: No results found.  Medications: Infusions: .  prismasol BGK 4/2.5 300 mL/hr at 01/31/21 2046  .  ceFEPime (MAXIPIME) IV Stopped (01/31/21 2145)  . feeding supplement (VITAL 1.5 CAL) 1,000 mL (01/31/21 1152)  . fentaNYL infusion INTRAVENOUS 100 mcg/hr (02/01/21 0800)  . heparin 1,650 Units/hr (02/01/21 0800)  . norepinephrine (LEVOPHED) Adult infusion 22 mcg/min (02/01/21 0800)  . prismasol BGK 2/2.5 dialysis solution 1,500 mL/hr at 02/01/21 0734  . prismasol BGK 2/2.5 replacement solution 500 mL/hr at 02/01/21 0028  . propofol (DIPRIVAN) infusion Stopped (01/31/21 0858)  . vancomycin Stopped (01/31/21 2308)    Scheduled Medications: . amiodarone  200 mg Per Tube BID  . B-complex with vitamin C  1 tablet Oral Daily  . chlorhexidine gluconate (MEDLINE KIT)  15 mL Mouth Rinse BID  . Chlorhexidine Gluconate Cloth  6 each Topical Q0600  . clindamycin   Topical BID  . darbepoetin (ARANESP) injection - DIALYSIS  150 mcg Intravenous Q Fri-HD  . diclofenac Sodium  2 g Topical QID  . docusate  100 mg Per Tube BID  . feeding supplement (PROSource TF)  45 mL Per Tube QID  . insulin aspart  1-3 Units Subcutaneous Q4H  . loratadine  10 mg Per Tube Daily  . mouth rinse  15 mL Mouth Rinse 10 times per day  . midodrine  15 mg Per Tube TID WC  . pantoprazole sodium  40 mg Per Tube Daily  . polyethylene glycol  17 g Per Tube Daily  . sodium chloride flush  10-40 mL Intracatheter Q12H    have reviewed scheduled and prn medications.  Physical Exam: General: ill appearing, intubated Heart: s1s2, irreg irreg, pericardial drain in place, left chest swelling Lungs: diminished air entry bibasilar, intubated Abdomen:soft, Non-tender, distended Extremities: 1+ Bilateral lower extremity edema Neuro: sedated Dialysis Access: LUE AV fistula has good thrill and bruit, rij temp HD catheter  Vikas Singh 02/01/2021,9:24 AM  LOS: 11 days

## 2021-02-01 NOTE — Progress Notes (Signed)
Brief cardiology note: No significant change in clinical status. Hgb dropping this AM without clear source of bleeding.   Appreciate palliative care evaluation. Plan for full code while family comes into town, then further decisions on goals of care will be made.  Cardiology will follow peripherally, please call with any questions.  Buford Dresser, MD, PhD, [facility]  [address] [place], [place] [address] [phone]

## 2021-02-02 ENCOUNTER — Inpatient Hospital Stay (HOSPITAL_COMMUNITY): Payer: 59

## 2021-02-02 DIAGNOSIS — Z9911 Dependence on respirator [ventilator] status: Secondary | ICD-10-CM

## 2021-02-02 DIAGNOSIS — I469 Cardiac arrest, cause unspecified: Secondary | ICD-10-CM | POA: Diagnosis not present

## 2021-02-02 DIAGNOSIS — R0781 Pleurodynia: Secondary | ICD-10-CM | POA: Diagnosis not present

## 2021-02-02 DIAGNOSIS — Z515 Encounter for palliative care: Secondary | ICD-10-CM

## 2021-02-02 DIAGNOSIS — E8779 Other fluid overload: Secondary | ICD-10-CM | POA: Diagnosis not present

## 2021-02-02 DIAGNOSIS — I314 Cardiac tamponade: Secondary | ICD-10-CM | POA: Diagnosis not present

## 2021-02-02 DIAGNOSIS — I5033 Acute on chronic diastolic (congestive) heart failure: Secondary | ICD-10-CM | POA: Diagnosis not present

## 2021-02-02 LAB — CBC
HCT: 23.9 % — ABNORMAL LOW (ref 39.0–52.0)
HCT: 23.7 % — ABNORMAL LOW (ref 39.0–52.0)
Hemoglobin: 7.4 g/dL — ABNORMAL LOW (ref 13.0–17.0)
Hemoglobin: 7.5 g/dL — ABNORMAL LOW (ref 13.0–17.0)
MCH: 31.5 pg (ref 26.0–34.0)
MCH: 32.3 pg (ref 26.0–34.0)
MCHC: 31 g/dL (ref 30.0–36.0)
MCHC: 31.6 g/dL (ref 30.0–36.0)
MCV: 101.7 fL — ABNORMAL HIGH (ref 80.0–100.0)
MCV: 102.2 fL — ABNORMAL HIGH (ref 80.0–100.0)
Platelets: 176 10*3/uL (ref 150–400)
Platelets: 179 10*3/uL (ref 150–400)
RBC: 2.35 MIL/uL — ABNORMAL LOW (ref 4.22–5.81)
RBC: 2.32 MIL/uL — ABNORMAL LOW (ref 4.22–5.81)
RDW: 18.8 % — ABNORMAL HIGH (ref 11.5–15.5)
RDW: 19.1 % — ABNORMAL HIGH (ref 11.5–15.5)
WBC: 37.6 10*3/uL — ABNORMAL HIGH (ref 4.0–10.5)
WBC: 38.3 10*3/uL — ABNORMAL HIGH (ref 4.0–10.5)
nRBC: 3.5 % — ABNORMAL HIGH (ref 0.0–0.2)
nRBC: 3.7 % — ABNORMAL HIGH (ref 0.0–0.2)

## 2021-02-02 LAB — RENAL FUNCTION PANEL
Albumin: 1.7 g/dL — ABNORMAL LOW (ref 3.5–5.0)
Albumin: 1.8 g/dL — ABNORMAL LOW (ref 3.5–5.0)
Albumin: 1.8 g/dL — ABNORMAL LOW (ref 3.5–5.0)
Anion gap: 6 (ref 5–15)
Anion gap: 7 (ref 5–15)
Anion gap: 6 (ref 5–15)
BUN: 37 mg/dL — ABNORMAL HIGH (ref 8–23)
BUN: 36 mg/dL — ABNORMAL HIGH (ref 8–23)
BUN: 32 mg/dL — ABNORMAL HIGH (ref 8–23)
CO2: 26 mmol/L (ref 22–32)
CO2: 26 mmol/L (ref 22–32)
CO2: 26 mmol/L (ref 22–32)
Calcium: 12 mg/dL — ABNORMAL HIGH (ref 8.9–10.3)
Calcium: 12.1 mg/dL — ABNORMAL HIGH (ref 8.9–10.3)
Calcium: 12.3 mg/dL — ABNORMAL HIGH (ref 8.9–10.3)
Chloride: 101 mmol/L (ref 98–111)
Chloride: 100 mmol/L (ref 98–111)
Chloride: 101 mmol/L (ref 98–111)
Creatinine, Ser: 1.85 mg/dL — ABNORMAL HIGH (ref 0.61–1.24)
Creatinine, Ser: 1.78 mg/dL — ABNORMAL HIGH (ref 0.61–1.24)
Creatinine, Ser: 1.55 mg/dL — ABNORMAL HIGH (ref 0.61–1.24)
GFR, Estimated: 41 mL/min — ABNORMAL LOW (ref >60)
GFR, Estimated: 43 mL/min — ABNORMAL LOW (ref >60)
GFR, Estimated: 51 mL/min — ABNORMAL LOW (ref >60)
Glucose, Bld: 209 mg/dL — ABNORMAL HIGH (ref 70–99)
Glucose, Bld: 222 mg/dL — ABNORMAL HIGH (ref 70–99)
Glucose, Bld: 206 mg/dL — ABNORMAL HIGH (ref 70–99)
Phosphorus: 5.7 mg/dL — ABNORMAL HIGH (ref 2.5–4.6)
Phosphorus: 4.8 mg/dL — ABNORMAL HIGH (ref 2.5–4.6)
Phosphorus: 4.1 mg/dL (ref 2.5–4.6)
Potassium: 4 mmol/L (ref 3.5–5.1)
Potassium: 4 mmol/L (ref 3.5–5.1)
Potassium: 3.8 mmol/L (ref 3.5–5.1)
Sodium: 133 mmol/L — ABNORMAL LOW (ref 135–145)
Sodium: 133 mmol/L — ABNORMAL LOW (ref 135–145)
Sodium: 133 mmol/L — ABNORMAL LOW (ref 135–145)

## 2021-02-02 LAB — POCT I-STAT 7, (LYTES, BLD GAS, ICA,H+H)
Acid-Base Excess: 0 mmol/L (ref 0.0–2.0)
Acid-Base Excess: 3 mmol/L — ABNORMAL HIGH (ref 0.0–2.0)
Bicarbonate: 28 mmol/L (ref 20.0–28.0)
Bicarbonate: 28.1 mmol/L — ABNORMAL HIGH (ref 20.0–28.0)
Calcium, Ion: 1.73 mmol/L (ref 1.15–1.40)
Calcium, Ion: 1.81 mmol/L (ref 1.15–1.40)
HCT: 25 % — ABNORMAL LOW (ref 39.0–52.0)
HCT: 25 % — ABNORMAL LOW (ref 39.0–52.0)
Hemoglobin: 8.5 g/dL — ABNORMAL LOW (ref 13.0–17.0)
Hemoglobin: 8.5 g/dL — ABNORMAL LOW (ref 13.0–17.0)
O2 Saturation: 99 %
O2 Saturation: 99 %
Patient temperature: 98.3
Patient temperature: 98.8
Potassium: 4 mmol/L (ref 3.5–5.1)
Potassium: 4 mmol/L (ref 3.5–5.1)
Sodium: 136 mmol/L (ref 135–145)
Sodium: 136 mmol/L (ref 135–145)
TCO2: 29 mmol/L (ref 22–32)
TCO2: 30 mmol/L (ref 22–32)
pCO2 arterial: 45.4 mmHg (ref 32.0–48.0)
pCO2 arterial: 63.3 mmHg — ABNORMAL HIGH (ref 32.0–48.0)
pH, Arterial: 7.252 — ABNORMAL LOW (ref 7.350–7.450)
pH, Arterial: 7.401 (ref 7.350–7.450)
pO2, Arterial: 137 mmHg — ABNORMAL HIGH (ref 83.0–108.0)
pO2, Arterial: 157 mmHg — ABNORMAL HIGH (ref 83.0–108.0)

## 2021-02-02 LAB — BPAM RBC
Blood Product Expiration Date: 202204262359
ISSUE DATE / TIME: 202203271147
Unit Type and Rh: 5100

## 2021-02-02 LAB — MAGNESIUM
Magnesium: 2.5 mg/dL — ABNORMAL HIGH (ref 1.7–2.4)
Magnesium: 2.5 mg/dL — ABNORMAL HIGH (ref 1.7–2.4)

## 2021-02-02 LAB — TYPE AND SCREEN
ABO/RH(D): O POS
Antibody Screen: NEGATIVE
Unit division: 0

## 2021-02-02 LAB — TRIGLYCERIDES: Triglycerides: 43 mg/dL (ref <150)

## 2021-02-02 LAB — SURGICAL PATHOLOGY

## 2021-02-02 LAB — GLUCOSE, CAPILLARY
Glucose-Capillary: 186 mg/dL — ABNORMAL HIGH (ref 70–99)
Glucose-Capillary: 188 mg/dL — ABNORMAL HIGH (ref 70–99)
Glucose-Capillary: 188 mg/dL — ABNORMAL HIGH (ref 70–99)
Glucose-Capillary: 193 mg/dL — ABNORMAL HIGH (ref 70–99)
Glucose-Capillary: 198 mg/dL — ABNORMAL HIGH (ref 70–99)
Glucose-Capillary: 205 mg/dL — ABNORMAL HIGH (ref 70–99)
Glucose-Capillary: 212 mg/dL — ABNORMAL HIGH (ref 70–99)

## 2021-02-02 LAB — APTT: aPTT: 76 seconds — ABNORMAL HIGH (ref 24–36)

## 2021-02-02 LAB — HEPARIN LEVEL (UNFRACTIONATED): Heparin Unfractionated: 0.98 IU/mL — ABNORMAL HIGH (ref 0.30–0.70)

## 2021-02-02 LAB — TROPONIN I (HIGH SENSITIVITY)
Troponin I (High Sensitivity): 141 ng/L (ref <18)
Troponin I (High Sensitivity): 146 ng/L (ref <18)

## 2021-02-02 LAB — CYTOLOGY - NON PAP

## 2021-02-02 MED ORDER — B COMPLEX-C PO TABS
1.0000 | ORAL_TABLET | Freq: Every day | ORAL | Status: DC
  Administered 2021-02-03 – 2021-02-05 (×3): 1
  Filled 2021-02-02 (×3): qty 1

## 2021-02-02 MED ORDER — INSULIN ASPART 100 UNIT/ML ~~LOC~~ SOLN
4.0000 [IU] | SUBCUTANEOUS | Status: DC
  Administered 2021-02-02 – 2021-02-05 (×20): 4 [IU] via SUBCUTANEOUS

## 2021-02-02 NOTE — Progress Notes (Signed)
Daily Progress Note   Patient Name: Mark [name]       Date: 02/02/2021 DOB: 1960-12-10  Age: 60 y.o. MRN#: [id number] Attending Physician: Garner Nash, DO Primary Care Physician: Delrae Rend, MD Admit Date: 01/11/2021  Reason for Consultation/Follow-up: Establishing goals of care  Subjective: Patient remains intubated/sedated on CRRT. No s/s of pain or distress during visit.  GOC:  F/u discussion with patient's brother, Mark [name] at bedside.   Reviewed events leading up to admission and course of hospitalization including diagnoses, interventions, plan of care. Mark [name] appreciates conversation with Dr. Lynetta Mare this afternoon and confirms understanding of the plan (and hope for) Mark [name]'s ability to wean from pressor support, wean from CRRT and attempt iHD again, and attempt SBT's and determine his ability to breath on his own.   No documented living will or HCPOA. Mark [name] confirms that Mark [name] is not married and does not have children. There are 9 living siblings out of 66. Mark [name] and Mark [name] are close. Mark [name] is primary contact but keeping his siblings informed of Mark [name]'s condition. Explained need for ongoing conversations pending clinical course--in regards to long-term ventilator support/feeding support if it comes to this and if this is something is brother would wish for. Reassured of ongoing support. PMT contact information given. Answered questions and support provided.   Length of Stay: 12  Current Medications: Scheduled Meds:  . amiodarone  200 mg Per Tube BID  . [START ON 02/03/2021] B-complex with vitamin C  1 tablet Per Tube Daily  . chlorhexidine gluconate (MEDLINE KIT)  15 mL Mouth Rinse BID  . Chlorhexidine Gluconate Cloth  6 each Topical Q0600  . clindamycin   Topical BID  .  darbepoetin (ARANESP) injection - DIALYSIS  150 mcg Intravenous Q Fri-HD  . diclofenac Sodium  2 g Topical QID  . docusate  100 mg Per Tube BID  . feeding supplement (PROSource TF)  45 mL Per Tube QID  . insulin aspart  1-3 Units Subcutaneous Q4H  . loratadine  10 mg Per Tube Daily  . mouth rinse  15 mL Mouth Rinse 10 times per day  . midodrine  15 mg Per Tube TID WC  . pantoprazole sodium  40 mg Per Tube Daily  . polyethylene glycol  17 g Per Tube Daily  . sodium chloride flush  10-40  mL Intracatheter Q12H    Continuous Infusions: .  prismasol BGK 4/2.5 300 mL/hr at 02/02/21 0939  . sodium chloride 10 mL/hr at 02/02/21 1400  . feeding supplement (VITAL 1.5 CAL) 1,000 mL (02/02/21 0800)  . fentaNYL infusion INTRAVENOUS 100 mcg/hr (02/02/21 1400)  . heparin 1,650 Units/hr (02/02/21 1400)  . norepinephrine (LEVOPHED) Adult infusion 20 mcg/min (02/02/21 1400)  . prismasol BGK 2/2.5 dialysis solution 1,500 mL/hr at 02/02/21 1312  . prismasol BGK 2/2.5 replacement solution 500 mL/hr at 02/02/21 1413  . propofol (DIPRIVAN) infusion Stopped (01/31/21 0858)  . vasopressin 0.04 Units/min (02/02/21 1400)    PRN Meds: sodium chloride, acetaminophen **OR** acetaminophen, fentaNYL, midazolam, Muscle Rub, polyethylene glycol, sodium chloride flush, traMADol  Physical Exam Vitals and nursing note reviewed.  Constitutional:      Interventions: He is sedated and intubated.  Cardiovascular:     Rate and Rhythm: Normal rate.  Pulmonary:     Effort: No tachypnea, accessory muscle usage or respiratory distress. He is intubated.     Comments: vent Skin:    General: Skin is warm and dry.  Neurological:     Comments: Sedated. Will follow commands.            Vital Signs: BP (!) 171/58   Pulse 61   Temp (!) 97.1 F (36.2 C) (Axillary)   Resp (!) 22   Ht _0  (1.753 m)   Wt 101.7 kg   SpO2 100%   BMI 33.11 kg/m  SpO2: SpO2: 100 % O2 Device: O2 Device: Ventilator O2 Flow Rate: O2 Flow  Rate (L/min): 6 L/min  Intake/output summary:   Intake/Output Summary (Last 24 hours) at 02/02/2021 1425 Last data filed at 02/02/2021 1400 Gross per 24 hour  Intake 4000.58 ml  Output 5902 ml  Net -1901.42 ml   LBM: Last BM Date: 01/30/21 Baseline Weight: Weight: 111.8 kg Most recent weight: Weight: 101.7 kg       Palliative Assessment/Data: PPS 30%      Patient Active Problem List   Diagnosis Date Noted  . Cardiac/pericardial tamponade   . Counseling regarding advance care planning and goals of care   . Full code status   . Hypotension 01/20/2021  . Encounter for central line placement   . Arterial line in place   . Cardiac arrest ([place])   . Acute respiratory distress   . Respiratory failure, acute ([place])   . Fluid overload 02/02/2021  . 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 Hyperphosphatemia 12/16/2020  . Acute metabolic encephalopathy 78/29/5621  . Acquired thrombophilia ([place]) 12/16/2020  . Hyponatremia 12/15/2020  . Hypercalcemia 12/15/2020  . Anemia of chronic disease 12/15/2020  . Acute on chronic diastolic heart failure ([place]) 12/14/2020  . Atrial flutter ([place]) 10/08/2020  . Closed dislocation of left hip ([place]) 07/26/2020  . CKD (chronic kidney disease), stage IV ([place]) 07/26/2020  . Gout 07/26/2020  . Diverticula of colon 04/29/2020  . Acute renal failure superimposed on chronic kidney disease ([place]) 04/29/2020  . Diverticulitis   . 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 Benign essential HTN 10/09/2011  . Post-traumatic osteoarthritis of left hip 10/06/2011    Palliative Care Assessment & Plan   Patient Profile: Per Dr. Valeta Harms progress note 3/25 -> " 60 y/o M who presented to [facility] on 3/16 for elective cardioversion but presented with shortness of breath, leg swelling and palpitations.   The patient has known CKD IV with baseline sr cr of  5, conservative management, L arm AVF in place in anticipation of HD.  On presentation, he was found to have significant volume overload needing admission per TRH. After cardioversion he became transiently hypotensive and briefly required neosynephrine gtt. He was admitted and Nephrology was consulted. Hospital course notable for Proteus UTI, anemia, intolerance to initial HD (chest pain, SVT) requiring brief CVVHD and BIPAP use. In the ICU he continued to require bipap. He was transitioned to [facility] and had one session on 3/23 with 3L removed / -750 for day. In the early am of 3/24 the patient pulled his bipap mask off and suffered a respiratory arrest requiring intubation and one round of CPR."  3/24 left thoracotomy for pericardial window   3/28 patient suffered another brief arrest last night due to mucus plugging. Remains intubated, sedated, on CRRT, levo, and vasopressin.  Assessment: PEA arrest Cardiac tamponade Acute hypercarbic respiratory failure Acute diastolic CHF CKD V, new ESRD Atrial fibrillation/flutter Moderate aortic stenosis Proteus UTI Chronic hydradenitis/left groin wound Leukocytosis Moderate protein calorie malnutrition  Recommendations/Plan: Continue full code/full scope. Watchful waiting.  Ongoing palliative discussions pending clinical course. No documented living will/HCPOA. No spouse or children. Default HCPOA is NOK--brother, Mark [name] but also important to include other siblings with decision making (9 living). Mark [name] is local and primary contact. PMT provider will continue to follow.  Goals of Care and Additional Recommendations: Limitations on Scope of Treatment: Full Scope Treatment  Code Status: FULL   Code Status Orders  (From admission, onward)         Start     Ordered   02/02/2021 1605  Full code  Continuous        01/17/2021 1604        Code Status History    Date Active Date Inactive Code Status Order ID Comments User Context   12/14/2020 1951 12/23/2020 2006 Full Code [id number]  Lenore Cordia, MD ED    10/08/2020 2124 10/17/2020 0227 Full Code [id number]  Rise Patience, MD ED   07/26/2020 2028 08/01/2020 1752 Full Code [id number]  Samuella Cota, MD Inpatient   07/26/2020 1859 07/26/2020 2028 Full Code [id number]  Samuella Cota, MD ED   04/29/2020 1916 05/03/2020 1855 Full Code [id number]  Lequita Halt, MD ED   10/06/2011 1955 10/12/2011 1500 Full Code [id number]  Tish Frederickson, RN Inpatient   Advance Care Planning Activity      Prognosis: Guarded  Discharge Planning: To Be Determined  Care plan was discussed with RN, brother Mark [name])  Thank you for allowing the Palliative Medicine Team to assist in the care of this patient.   Total Time 20 Prolonged Time Billed  no      Greater than 50%  of this time was spent counseling and coordinating care related to the above assessment and plan.  Ihor Dow, DNP, FNP-C Palliative Medicine Team  Phone: [phone] Fax: [phone]  Please contact Palliative Medicine Team phone at [phone] for questions and concerns.

## 2021-02-02 NOTE — Progress Notes (Signed)
Patient had MVe alarms on event just prior to midnight, this RN was at bedside, gave patient fentanyl bolus d/t inability to suction ETT, suggesting patient was biting on ETT. After bolus, was able to pass suction through ETT but unable to get anything out. MVe alarms continued. Gave another PRN fentanyl bolus and called RT to bedside. When RT arrived, patient BP began to drop so this RN pushed Elink button for help. Up titrated on levophed gtt and decreased fluid removal rate on CRRT. Shortly after that, patient showed asystole on monitor. I immediately started compressions and called for help. Once another compressor took over, I returned blood on CRRT and reported to [place] team the above described events. See Code Documentation note. Patient achieved ROSC after 5 minutes of CPR. CCM MD performed emergency bronch at bedside s/p ROSC. See separate procedure note for findings. ABG and labs sent. Family was updated by CCM MD.   CRRT has been restarted and patient is tolerating well so far. Pt will not open eyes for follow commands yet but this RN has up-titrated on fentanyl gtt for increased comfort of patient and compliance with ventilator.

## 2021-02-02 NOTE — Progress Notes (Signed)
Increased respiratory rate from 18 to 22, decreased fio2 to 70%.

## 2021-02-02 NOTE — Procedures (Addendum)
Bronchoscopy Procedure Note  Mark [name]  [id number]  10/31/1961  Date:02/02/21  Time:12:45 AM   Provider Performing:Ronan Duecker Duwayne Heck   Procedure(s):  Flexible Bronchoscopy [phone])  Indication(s) S/p cardiac arrest after unable to ventilate.   Apneic on vent. Blood clot suctioned from ET tube after code.  Bronch to look for source of bleeding, clear out any residual clots  Consent Unable to obtain consent due to emergent nature of procedure.  Anesthesia None.    Time Out Verified patient identification, verified procedure, site/side was marked, verified correct patient position, special equipment/implants available, medications/allergies/relevant history reviewed, required imaging and test results available.   Sterile Technique Usual hand hygiene, masks, gowns, and gloves were used   Procedure Description Bronchoscope advanced through endotracheal tube and into airway.  Airways were examined down to subsegmental level with findings noted below.     Findings:  Following diagnostic evaluation, Therapeutic aspiration performed in at [facility].  Some pale yellow tenacious secretions, blood tinged.   Airways collapsing >50% on exhalation.    Complications/Tolerance None; patient tolerated the procedure well.  No CXR needed.   EBL Minimal   Specimen(s) None

## 2021-02-02 NOTE — Progress Notes (Signed)
This chaplain responded to PMT consult for spiritual care and prayer.  The Pt. is intubated with his brother-[name] at the bedside.    The Pt. RN-Q updated the chaplain. The chaplain learned from [place] the Pt. prefers to be called Mark [name] if you are family. [name] shares the Pt. is covered in prayer, along with the medical team. [name] describes the Pt. as having a huge heart for helping others.  [name] accepted the chaplain's invitation for prayer, companionship, and F/U spiritual care.

## 2021-02-02 NOTE — Plan of Care (Signed)
  Problem: Education: Goal: Knowledge of General Education information will improve Description: Including pain rating scale, medication(s)/side effects and non-pharmacologic comfort measures Outcome: Progressing   Problem: Health Behavior/Discharge Planning: Goal: Ability to manage health-related needs will improve Outcome: Progressing   Problem: Clinical Measurements: Goal: Ability to maintain clinical measurements within normal limits will improve Outcome: Progressing Goal: Will remain free from infection Outcome: Progressing Goal: Diagnostic test results will improve Outcome: Progressing Goal: Respiratory complications will improve Outcome: Progressing Goal: Cardiovascular complication will be avoided Outcome: Progressing   Problem: Activity: Goal: Risk for activity intolerance will decrease Outcome: Progressing   Problem: Nutrition: Goal: Adequate nutrition will be maintained Outcome: Progressing   Problem: Coping: Goal: Level of anxiety will decrease Outcome: Progressing   Problem: Elimination: Goal: Will not experience complications related to bowel motility Outcome: Progressing Goal: Will not experience complications related to urinary retention Outcome: Progressing   Problem: Pain Managment: Goal: General experience of comfort will improve Outcome: Progressing   Problem: Safety: Goal: Ability to remain free from injury will improve Outcome: Progressing   Problem: Skin Integrity: Goal: Risk for impaired skin integrity will decrease Outcome: Progressing   Problem: Education: Goal: Ability to demonstrate management of disease process will improve Outcome: Progressing Goal: Ability to verbalize understanding of medication therapies will improve Outcome: Progressing Goal: Individualized Educational Video(s) Outcome: Progressing   Problem: Activity: Goal: Capacity to carry out activities will improve Outcome: Progressing   Problem: Cardiac: Goal:  Ability to achieve and maintain adequate cardiopulmonary perfusion will improve Outcome: Progressing   Problem: Safety: Goal: Non-violent Restraint(s) Outcome: Progressing

## 2021-02-02 NOTE — Progress Notes (Signed)
[place] for IV heparin Indication: atrial fibrillation  Allergies  Allergen Reactions  . Penicillins Swelling, Rash and Other (See Comments)    Arms and eyes swell & skin breaks out  Has patient had a PCN reaction causing immediate rash, facial/tongue/throat swelling, SOB or lightheadedness with hypotension: Yes Has patient had a PCN reaction causing severe rash involving mucus membranes or skin necrosis: Yes Has patient had a PCN reaction that required hospitalization: No Has patient had a PCN reaction occurring within the last 10 years: No If all of the above answers are "NO", then may proceed with Cephalosporin use.   . Atorvastatin     Muscle pain in legs  . Rosuvastatin     Muscle pain in legs    Patient Measurements: Height: '5\' 9"'$  (175.3 cm) Weight: 101.7 kg (224 lb 3.3 oz) IBW/kg (Calculated) : 70.7 Heparin Dosing Weight: 95 kg  Vital Signs: Temp: 99 F (37.2 C) (03/28 0305) Temp Source: Axillary (03/28 0305) BP: 144/50 (03/28 0400) Pulse Rate: 32 (03/28 0600)  Labs: Recent Labs    01/31/21 1135 01/31/21 1346 02/01/21 0355 02/01/21 0928 02/01/21 1624 02/01/21 1627 02/02/21 0118 02/02/21 0240 02/02/21 0311  HGB  --    < > 6.9*   < >  --    < > 7.4* 8.5* 7.5*  HCT  --    < > 22.6*   < >  --    < > 23.9* 25.0* 23.7*  PLT  --    < > 187  --   --   --  176  --  179  APTT 79*  --  56*  --   --   --   --   --  76*  HEPARINUNFRC 1.38*  --  1.28*  --   --   --   --   --  0.98*  CREATININE  --    < > 1.93*  --  1.80*  --  1.85*  --  1.78*  TROPONINIHS  --   --   --   --   --   --  141*  --  146*   < > = values in this interval not displayed.    Estimated Creatinine Clearance: 51.2 mL/min (A) (by C-G formula based on SCr of 1.78 mg/dL (H)).   Medical History: Past Medical History:  Diagnosis Date  . Anemia    labs today indicate (09/27/11)  . Arthritis    right knee  . Bleeding ulcer    hx of, required 6 units blood   . Blood transfusion    6 units with ulcer repair surgery  . Chronic kidney disease    stage IV, has fistula, never started diaylsis  . Complication of anesthesia    little slow to wake up with last surgery  . Dyspnea    with exertion  . Gout   . Hip dislocation, left ([place]) 10/09/2020  . Hypertension   . Osteoporosis   . Pneumonia     Medications:  Infusions:  .  prismasol BGK 4/2.5 300 mL/hr at 02/01/21 1359  . sodium chloride 10 mL/hr at 02/02/21 0500  . feeding supplement (VITAL 1.5 CAL) 1,000 mL (02/02/21 0509)  . fentaNYL infusion INTRAVENOUS 150 mcg/hr (02/02/21 0500)  . heparin 1,650 Units/hr (02/02/21 0500)  . norepinephrine (LEVOPHED) Adult infusion 28 mcg/min (02/02/21 0500)  . prismasol BGK 2/2.5 dialysis solution 1,500 mL/hr at 02/02/21 0308  . prismasol BGK 2/2.5 replacement solution  500 mL/hr at 02/01/21 1520  . propofol (DIPRIVAN) infusion Stopped (01/31/21 0858)  . vancomycin Stopped (02/01/21 2326)  . vasopressin 0.04 Units/min (02/02/21 0500)    Assessment: 60 yo male on chronic anticoagulation with apixaban, was held for pericardial window.  Pharmacy asked to resume IV heparin.  Previously dosing heparin from aPTT since heparin levels falsely elevated from previous apixaban. Last dose of apixaban was 3/24 AM.  aPTT this AM therapeutic at 76, not correlating with HL yet. Hgb improved slightly after PRBCs given, up to 7.5.   Goal of Therapy:  Heparin level 0.3-.0.5 Monitor platelets by anticoagulation protocol: Yes   Plan:  Continue IV heparin at 1,650 units/hr  Follow daily HL and aPTT  Monitor CBC, s/sx bleeding  Mercy Riding, PharmD PGY1 Acute Care Pharmacy Resident Please refer to [facility] for unit-specific pharmacist

## 2021-02-02 NOTE — Progress Notes (Signed)
Inpatient Diabetes Program Recommendations  AACE/ADA: New Consensus Statement on Inpatient Glycemic Control   Target Ranges:  Prepandial:   less than 140 mg/dL      Peak postprandial:   less than 180 mg/dL (1-2 hours)      Critically ill patients:  140 - 180 mg/dL   Results for Mark [name], Mark [name] (MRN [id number]) as of 02/02/2021 12:23  Ref. Range 02/01/2021 07:38 02/01/2021 11:32 02/01/2021 16:24 02/01/2021 19:38 02/02/2021 00:19 02/02/2021 03:02 02/02/2021 07:46  Glucose-Capillary Latest Ref Range: 70 - 99 mg/dL 162 (H) 161 (H) 200 (H) 159 (H) 193 (H) 198 (H) 205 (H)   Review of Glycemic Control  Diabetes history: DM2 Outpatient Diabetes medications: None Current orders for Inpatient glycemic control: Novolog 1-3 units Q4H; Vital @ 55 ml/hr  Inpatient Diabetes Program Recommendations:    Insulin: Please consider ordering Novolog 2 units Q4H for tube feeding coverage. If tube feeding is stopped or held then Novolog tube feeding coverage should also be stopped or held.  Thanks, Barnie Alderman, RN, MSN, CDE Diabetes Coordinator Inpatient Diabetes Program [phone] (Team Pager from 8am to 5pm)

## 2021-02-02 NOTE — Progress Notes (Addendum)
Critical value Troponin 141 reported to Elink. Patient is s/p CPR and ROSC. Per Dr. Genevive Bi- normal expected result. Continue to monitor.

## 2021-02-02 NOTE — Progress Notes (Addendum)
eLink Physician-Brief Progress Note Patient Name: Mark [name] DOB: 01/01/1961 MRN: [id number]   Date of Service  02/02/2021  HPI/Events of Note  Sinus Bradycardia - HR = 44. BP = 153/46. EKG reveals Sinus Bradycardia - HR = 45. Wide QRS rhythm with occasional Premature ventricular complexes. Left axis deviation. Right bundle branch block. Inferior infarct , age undetermined.  eICU Interventions  Plan: 1. Hold 10 PM Amiodarone dose.      Intervention Category Major Interventions: Arrhythmia - evaluation and management  Kenyette Gundy Cornelia Copa 02/02/2021, 10:29 PM

## 2021-02-02 NOTE — Code Documentation (Addendum)
  Patient Name: Mark [name]   MRN: [id number]   Date of Birth/ Sex: 04-24-1961 , male      Admission Date: 02/05/2021  Attending Provider: Garner Nash, DO  Primary Diagnosis: Fluid overload   Indication: Pt was in his usual state of health until this PM, when he was noted to be in respiratory failure and asystole. Code blue was subsequently called. At the time of arrival on scene, ACLS protocol was underway.   Technical Description:  - CPR performance duration:  5 minutes  - Was defibrillation or cardioversion used? No   - Was external pacer placed? No  - Was patient intubated pre/post CPR? Yes   Medications Administered: Y = Yes; Blank = No Amiodarone    Atropine    Calcium    Epinephrine  y  Lidocaine    Magnesium    Norepinephrine    Phenylephrine    Sodium bicarbonate  y  Vasopressin     Post CPR evaluation:  - Final Status - Was patient successfully resuscitated ? Yes - What is current rhythm? Sinus - What is current hemodynamic status? Stable  Miscellaneous Information:  - Labs sent, including: CXR, abg, troponin, CBC, RFP, Mg, Phos  - Primary team notified?  Yes  - Family Notified? Yes  - Additional notes/ transfer status: Patient found in asystole likely from mucous plugging suctioned, obtained ROSC after 1 round of compression, epi and bicarb. PCCM as bedside and will resume care.     Iona Beard, MD  02/02/2021, 12:04 AM

## 2021-02-02 NOTE — Progress Notes (Signed)
Mark [name]  Assessment/ Plan: Pt is a 60 y.o. yo male  With PMH of HTN, DM, HLD, obesity, CKD stage V baseline creatinine level around 5 admitted with acute diastolic CHF noted when he presented for DCCV 01/15/2021.  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  -Continue with CRRT (Access: Right IJ temp line). Goal is net neg 50-100cc/hr cont for now.   -Appreciate assistance from palliative care -He has a left radiocephalic fistula that was created in October, 2017 by Dr. Scot Dock that is mature, able to use for IHD.  Cardiac arrest, PEA 3/24; 3/28 another arrest = asystole noted when mucus plugged -ROSC 61min. respiratory/hypoxia driven  Shock - multifactorial -pressor support and antibiotics per CCM, on midodrine as well  -Blood culture NGTD  Acute exacerbation of diastolic heart failure:  -Started CRRT for ultrafiltration. UFR see above  Pericardial effusion w/ tamponade -s/p pericardial window with thoracotomy 3/24   Anemia of chronic disease: Iron deficiency therefore ordered IV iron and ESA on 3/17.  Continue ESA qFriday.  Transfuse for hgb <7, hgb 7.5 today, per primary  Secondary hyperparathyroidism: phos controlled with rrt  Aflutter -cardiology following.  Was off a/c for surgery but now on heparin drip  Subjective: Asystolic code overnight secondary to mucus plugging; ROSC 58min.  CRRT running without issue net neg 50-155mL/hr.   D/w RN bedside.  I/Os yesterday 3.9 / 4.1, neg 228mL, UOP 59mL   Objective Vital signs in last 24 hours: Vitals:   02/02/21 0700 02/02/21 0715 02/02/21 0800 02/02/21 0810  BP:    (!) 150/61  Pulse: (!) 25 (!) 48 (!) 25 70  Resp: (!) 23 (!) 23 (!) 22 (!) 22  Temp:      TempSrc:      SpO2: 100% 100% 100% 100%  Weight:      Height:       Weight change:   Intake/Output Summary (Last 24 hours) at 02/02/2021 0905 Last data filed at 02/02/2021  0900 Gross per 24 hour  Intake 3929.33 ml  Output 4697 ml  Net -767.67 ml       Labs: Basic Metabolic Panel: Recent Labs  Lab 02/01/21 1624 02/02/21 0022 02/02/21 0118 02/02/21 0240 02/02/21 0311  NA 133*   < > 133* 136 133*  K 3.9   < > 4.0 4.0 4.0  CL 101  --  101  --  100  CO2 25  --  26  --  26  GLUCOSE 215*  --  209*  --  222*  BUN 32*  --  37*  --  36*  CREATININE 1.80*  --  1.85*  --  1.78*  CALCIUM 11.5*  --  12.0*  --  12.1*  PHOS 4.4  --  5.7*  --  4.8*   < > = values in this interval not displayed.   Liver Function Tests: Recent Labs  Lab 01/26/2021 0122 01/27/2021 0515 02/01/21 1624 02/02/21 0118 02/02/21 0311  AST 35  --   --   --   --   ALT 17  --   --   --   --   ALKPHOS 85  --   --   --   --   BILITOT 0.9  --   --   --   --   PROT 7.8  --   --   --   --   ALBUMIN 2.4*   < >  1.7* 1.7* 1.8*   < > = values in this interval not displayed.   No results for input(s): LIPASE, AMYLASE in the last 168 hours. No results for input(s): AMMONIA in the last 168 hours. CBC: Recent Labs  Lab 01/30/21 0403 01/31/21 1346 02/01/21 0355 02/01/21 0928 02/02/21 0118 02/02/21 0240 02/02/21 0311  WBC 26.2* 36.3* 38.0*  --  37.6*  --  38.3*  HGB 7.6* 7.1* 6.9*   < > 7.4* 8.5* 7.5*  HCT 23.3* 23.1* 22.6*   < > 23.9* 25.0* 23.7*  MCV 98.3 103.1* 103.7*  --  101.7*  --  102.2*  PLT 249 189 187  --  176  --  179   < > = values in this interval not displayed.   Cardiac Enzymes: No results for input(s): CKTOTAL, CKMB, CKMBINDEX, TROPONINI in the last 168 hours. CBG: Recent Labs  Lab 02/01/21 1624 02/01/21 1938 02/02/21 0019 02/02/21 0302 02/02/21 0746  GLUCAP 200* 159* 193* 198* 205*    Iron Studies:  No results for input(s): IRON, TIBC, TRANSFERRIN, FERRITIN in the last 72 hours. Studies/Results: CT ABDOMEN PELVIS WO CONTRAST  Result Date: 02/01/2021 CLINICAL DATA:  Sepsis, status post cardioversion on 03/16. EXAM: CT ABDOMEN AND PELVIS WITHOUT  CONTRAST TECHNIQUE: Multidetector CT imaging of the abdomen and pelvis was performed following the standard protocol without IV contrast. COMPARISON:  Chest x-ray 02/01/2021, CT chest 11/30/2019. CT abdomen pelvis 04/29/2020 FINDINGS: Lower chest: At least small volume partially visualized pericardial effusion. Bilateral trace pleural effusions, left greater than right. Bilateral lower lobe heterogeneous consolidation. Marked mitral annular calcifications. Hepatobiliary: No focal liver abnormality. No gallstones, gallbladder wall thickening, or pericholecystic fluid. No biliary dilatation. Pancreas: No focal lesion. Normal pancreatic contour. No surrounding inflammatory changes. No main pancreatic ductal dilatation. Spleen: Normal in size without focal abnormality. Adrenals/Urinary Tract: No adrenal nodule bilaterally. Multiple subcentimeter hypo and hyperdensities within the kidneys are too small to characterize. There there is a 1.7 cm fluid density lesion within left kidney likely represents a simple renal cyst. No nephrolithiasis, no hydronephrosis, and no contour-deforming renal mass. No ureterolithiasis or hydroureter. The urinary bladder is decompressed. Stomach/Bowel: Enteric tube with tip terminating in the second portion of the duodenum. Stomach is within normal limits. No evidence of bowel wall thickening or dilatation. Stool throughout the ascending colon and transverse colon with no definite pneumatosis (6:38, 51). Scattered sigmoid diverticulosis. Appendix appears normal. Vascular/Lymphatic: Left femoral approach arterial and venous catheters terminating in the external iliac vessels. No abdominal aorta or iliac aneurysm. Mild atherosclerotic plaque of the aorta and its branches. No abdominal, pelvic, or inguinal lymphadenopathy. Reproductive: Not well visualized due to streak artifact originating from the femoral surgical hardware. Other: Upper abdomen surgical clips. Trace left upper abdomen simple  free fluid. No intraperitoneal free gas. No organized fluid collection. Musculoskeletal: Fat containing supraumbilical and periumbilical hernias (5:18). No suspicious lytic or blastic osseous lesions. No acute displaced fracture. Multilevel degenerative changes of the spine. Partially visualized left hip arthroplasty. IMPRESSION: 1. Bilateral lower lobe heterogeneous consolidations likely reflecting a combination of atelectasis and infection/inflammation. 2. At least small volume partially visualized pericardial effusion. 3. Bilateral trace pleural effusions, left greater than right. 4. Stool throughout the ascending colon and transverse colon with no definite pneumatosis. Limited evaluation for ischemia on this noncontrast study. Given appearance of these loops of large bowel, recommend close clinical follow-up with serial abdominal exams as pneumatosis cannot be fully excluded. Correlate with lactate levels. 5. Scattered sigmoid diverticulosis with no findings  of acute diverticulitis. 6. Multiple subcentimeter hyper and hypodense renal lesions that are too small to characterize. 7. Aortic Atherosclerosis (ICD10-I70.0) including marked mitral annular calcifications. Electronically Signed   By: Iven Finn M.D.   On: 02/01/2021 18:06   DG Chest Port 1 View  Result Date: 02/02/2021 CLINICAL DATA:  Status post cardiac arrest EXAM: PORTABLE CHEST 1 VIEW COMPARISON:  02/01/2021 FINDINGS: Cardiac shadow is enlarged but stable. Endotracheal tube, feeding catheter and right jugular central line are again seen and stable. Increased airspace opacity is noted likely representing patchy edema related to the recent arrest. No bony abnormality is seen. IMPRESSION: Increasing patchy airspace opacity likely representing edema. Tubes and lines as described. Electronically Signed   By: Inez Catalina M.D.   On: 02/02/2021 00:24   DG CHEST PORT 1 VIEW  Result Date: 02/01/2021 CLINICAL DATA:  Questionable sepsis EXAM:  PORTABLE CHEST 1 VIEW COMPARISON:  Three days ago FINDINGS: Endotracheal tube with tip just below the clavicular heads. The feeding tube at least reaches the stomach. Right IJ line with tip near the SVC origin. Cardiomegaly. Straight margin along the right heart border suggesting lobar collapse. The right diaphragm is less well seen than on remote imaging. There is a left chest tube in place. No visible pneumothorax. IMPRESSION: 1. Increased left pleural effusion. 2. Suspect right lower lobe collapse. Electronically Signed   By: Monte Fantasia M.D.   On: 02/01/2021 11:09    Medications: Infusions: .  prismasol BGK 4/2.5 300 mL/hr at 02/01/21 1359  . sodium chloride 10 mL/hr at 02/02/21 0900  . feeding supplement (VITAL 1.5 CAL) 1,000 mL (02/02/21 0800)  . fentaNYL infusion INTRAVENOUS 125 mcg/hr (02/02/21 0900)  . heparin 1,650 Units/hr (02/02/21 0900)  . norepinephrine (LEVOPHED) Adult infusion 19 mcg/min (02/02/21 0900)  . prismasol BGK 2/2.5 dialysis solution 1,500 mL/hr at 02/02/21 8413  . prismasol BGK 2/2.5 replacement solution 500 mL/hr at 02/01/21 1520  . propofol (DIPRIVAN) infusion Stopped (01/31/21 0858)  . vancomycin Stopped (02/01/21 2326)  . vasopressin 0.04 Units/min (02/02/21 0900)    Scheduled Medications: . amiodarone  200 mg Per Tube BID  . B-complex with vitamin C  1 tablet Oral Daily  . chlorhexidine gluconate (MEDLINE KIT)  15 mL Mouth Rinse BID  . Chlorhexidine Gluconate Cloth  6 each Topical Q0600  . clindamycin   Topical BID  . darbepoetin (ARANESP) injection - DIALYSIS  150 mcg Intravenous Q Fri-HD  . diclofenac Sodium  2 g Topical QID  . docusate  100 mg Per Tube BID  . feeding supplement (PROSource TF)  45 mL Per Tube QID  . insulin aspart  1-3 Units Subcutaneous Q4H  . loratadine  10 mg Per Tube Daily  . mouth rinse  15 mL Mouth Rinse 10 times per day  . midodrine  15 mg Per Tube TID WC  . pantoprazole sodium  40 mg Per Tube Daily  . polyethylene glycol   17 g Per Tube Daily  . sodium chloride flush  10-40 mL Intracatheter Q12H    have reviewed scheduled and prn medications.  Physical Exam: General: ill appearing, intubated  Heart: s1s2, irreg irreg, pericardial drain in place with significnat outpt still (226mL noted just after arrest overnight), left chest swelling Lungs: diminished air entry bibasilar, intubated Abdomen:soft, Non-tender, distended Extremities: trace Bilateral lower extremity edema Neuro: awake and alert Dialysis Access: LUE AV fistula has good thrill and bruit, rij temp HD catheter  Justin Mend 02/02/2021,9:05 AM  LOS: 12  days

## 2021-02-02 NOTE — Progress Notes (Signed)
NAME:  Mark [name], MRN:  [id number], DOB:  Jun 21, 1961, LOS: 71 ADMISSION DATE:  01/12/2021, CONSULTATION DATE: 3/19 REFERRING MD:  Dr. Erlinda Hong, CHIEF COMPLAINT:  TRH    History of Present Illness:  60 y/o M who presented to [facility] on 3/16 for elective cardioversion but presented with shortness of breath, leg swelling and palpitations.   The patient has known CKD IV with baseline sr cr of 5, conservative management, L arm AVF in place in anticipation of HD.  On presentation, he was found to have significant volume overload needing admission per TRH.  After cardioversion he became transiently hypotensive and briefly required neosynephrine gtt.  He was admitted and Nephrology was consulted. Hospital course notable for Proteus UTI, anemia, intolerance to initial HD (chest pain, SVT) requiring brief CVVHD and BIPAP use.  In the ICU he continued to require bipap. He was transitioned to [facility] and had one session on 3/23 with 3L removed / -750 for day.  In the early am of 3/24 the patient pulled his bipap mask off and suffered a respiratory arrest requiring intubation and one round of CPR.   Pertinent  Medical History  CKD IV - HD started this admit (01/28/2021) HTN HLD Chronic Diastolic CHF  Moderate AS Atrial Flutter - on Eliquis  Obesity  Anemia of Chronic Disease   Significant Hospital Events: Including procedures, antibiotic start and stop dates in addition to other pertinent events    3/16 Admit after cardioversion, SOB, leg swelling / palpitations prior to presentation   3/19 PCCM consulted   3/20 CRRT started  3/21 Improved mental status, wore BiPAP overnight. Staph epidermis growing from [facility]. UC growing proteus, abx changed from ceftriaxone to cefepime.  Fever 100.2, -3.6L in last 24 hours.    3/21 CVVHD stopped.  3/23 Tolerated iHD  3/24 Respiratory arrest, PEA, (pulled bipap off), intubated, re-cultured, ECHO tamponade, Pericardial window, Pericardial drain 28Fr by Julien Girt  3/25  palliative care consulted   3/27 CVVHD, pressors levo + vaso   Interim History / Subjective:   Arrest last pm due to hypoxia, likely due to patient biting tube.    Objective   Blood pressure (!) 171/58, pulse 61, temperature (!) 97.1 F (36.2 C), temperature source Axillary, resp. rate (!) 22, height '5\' 9"'$  (1.753 m), weight 101.7 kg, SpO2 100 %.    Vent Mode: PRVC FiO2 (%):  [30 %-100 %] 50 % Set Rate:  [18 bmp-22 bmp] 22 bmp Vt Set:  [560 mL] 560 mL PEEP:  [5 cmH20] 5 cmH20 Plateau Pressure:  [18 cmH20-19 cmH20] 19 cmH20 IPAP 20 EPAP 8  Intake/Output Summary (Last 24 hours) at 02/02/2021 1321 Last data filed at 02/02/2021 1200 Gross per 24 hour  Intake 3859.3 ml  Output 5623 ml  Net -1763.7 ml   Filed Weights   01/30/21 0315 01/31/21 0456 02/02/21 0500  Weight: 104 kg 108.1 kg 101.7 kg    Examination: General: critically ill, intubated on life support   HEENT: ETT in place, patient mouthing ETT.  Neuro: sedated on mech vent, opens eyes to voice, will follow commands weakly.   CV: Irregularly, irregular, s1 s2 normal  PULM: normal vesicular breath sounds bilaterally.  GI: soft nt nd  Extremities: no edema  Skin: no rash  VVS: left AV fistula   Laboratory investigations reviewed.     Resolved Hospital Problem list     Assessment & Plan:   PEA Arrest Cardiac Tamponade suspect respiratory driven as pulled bipap off + tamponade  Acute Hypercarbic Respiratory Failure in setting of Acute Diastolic CHF CKD V / New ESRD  Acute Exacerbation of dCHF Atrial Fibrillation / Flutter  Chronic Hypotension Acute on Chronic Diastolic HF Moderate Aortic Stenosis  Macrocytic Anemia  BPH Septic Shock  Proteus UTI Chronic Hydradenitis / L Groin Wound, Small Wound on Right Thigh CNS Bacteremia vs Contaminant with 2/4 bottles Leukocytosis -- worsening  Left Shoulder Pain  Moderate Protein Calorie Malnutrition GOC:  Plan:  - Goal at this point this to get the patient to  tolerate iHD. LV function is normal, so should be able tolerate now that tamponade resolved. - Suspect that fluid overload is largely due to worsening renal function.  - Wean off vasopressors - Begin SBT's - given how weak he appears, he may require a tracheostomy.    Best practice (evaluated daily)  Diet:  TF Pain/Anxiety/Delirium protocol (if indicated): Yes (RASS goal -1) on fentanyl VAP protocol (if indicated): Not indicated DVT prophylaxis: Systemic AC GI prophylaxis: PPI Glucose control:  Basal insulin No Central venous access:  Yes, and it is still needed Arterial line:  N/A Foley:  N/A Mobility:  bed rest  PT consulted: N/A Last date of multidisciplinary goals of care discussion: Palliative care meeting 01/31/2021 Code Status:  full code Disposition: ICU   CRITICAL CARE Performed by: Kipp Brood   Total critical care time: 40 minutes  Critical care time was exclusive of separately billable procedures and treating other patients.  Critical care was necessary to treat or prevent imminent or life-threatening deterioration.  Critical care was time spent personally by me on the following activities: development of treatment plan with patient and/or surrogate as well as nursing, discussions with consultants, evaluation of patient's response to treatment, examination of patient, obtaining history from patient or surrogate, ordering and performing treatments and interventions, ordering and review of laboratory studies, ordering and review of radiographic studies, pulse oximetry, re-evaluation of patient's condition and participation in multidisciplinary rounds.  Kipp Brood, MD [facility] ICU Physician [place]  Pager: [phone] Mobile: [phone] After hours: [phone].   02/02/2021 1:21 PM

## 2021-02-03 ENCOUNTER — Inpatient Hospital Stay (HOSPITAL_COMMUNITY): Payer: 59

## 2021-02-03 DIAGNOSIS — R0781 Pleurodynia: Secondary | ICD-10-CM | POA: Diagnosis not present

## 2021-02-03 DIAGNOSIS — I313 Pericardial effusion (noninflammatory): Secondary | ICD-10-CM | POA: Diagnosis not present

## 2021-02-03 LAB — APTT
aPTT: 48 seconds — ABNORMAL HIGH (ref 24–36)
aPTT: 64 seconds — ABNORMAL HIGH (ref 24–36)

## 2021-02-03 LAB — CBC
HCT: 25.6 % — ABNORMAL LOW (ref 39.0–52.0)
Hemoglobin: 7.9 g/dL — ABNORMAL LOW (ref 13.0–17.0)
MCH: 31.7 pg (ref 26.0–34.0)
MCHC: 30.9 g/dL (ref 30.0–36.0)
MCV: 102.8 fL — ABNORMAL HIGH (ref 80.0–100.0)
Platelets: 145 10*3/uL — ABNORMAL LOW (ref 150–400)
RBC: 2.49 MIL/uL — ABNORMAL LOW (ref 4.22–5.81)
RDW: 18.7 % — ABNORMAL HIGH (ref 11.5–15.5)
WBC: 34.2 10*3/uL — ABNORMAL HIGH (ref 4.0–10.5)
nRBC: 1.7 % — ABNORMAL HIGH (ref 0.0–0.2)

## 2021-02-03 LAB — CULTURE, BLOOD (ROUTINE X 2)
Culture: NO GROWTH
Culture: NO GROWTH
Special Requests: ADEQUATE
Special Requests: ADEQUATE

## 2021-02-03 LAB — RENAL FUNCTION PANEL
Albumin: 1.7 g/dL — ABNORMAL LOW (ref 3.5–5.0)
Albumin: 1.8 g/dL — ABNORMAL LOW (ref 3.5–5.0)
Anion gap: 8 (ref 5–15)
Anion gap: 7 (ref 5–15)
BUN: 31 mg/dL — ABNORMAL HIGH (ref 8–23)
BUN: 31 mg/dL — ABNORMAL HIGH (ref 8–23)
CO2: 26 mmol/L (ref 22–32)
CO2: 23 mmol/L (ref 22–32)
Calcium: 12.6 mg/dL — ABNORMAL HIGH (ref 8.9–10.3)
Calcium: 11.7 mg/dL — ABNORMAL HIGH (ref 8.9–10.3)
Chloride: 100 mmol/L (ref 98–111)
Chloride: 104 mmol/L (ref 98–111)
Creatinine, Ser: 1.43 mg/dL — ABNORMAL HIGH (ref 0.61–1.24)
Creatinine, Ser: 1.38 mg/dL — ABNORMAL HIGH (ref 0.61–1.24)
GFR, Estimated: 56 mL/min — ABNORMAL LOW (ref >60)
GFR, Estimated: 58 mL/min — ABNORMAL LOW (ref >60)
Glucose, Bld: 148 mg/dL — ABNORMAL HIGH (ref 70–99)
Glucose, Bld: 177 mg/dL — ABNORMAL HIGH (ref 70–99)
Phosphorus: 4.4 mg/dL (ref 2.5–4.6)
Phosphorus: 4.8 mg/dL — ABNORMAL HIGH (ref 2.5–4.6)
Potassium: 3.8 mmol/L (ref 3.5–5.1)
Potassium: 4 mmol/L (ref 3.5–5.1)
Sodium: 134 mmol/L — ABNORMAL LOW (ref 135–145)
Sodium: 134 mmol/L — ABNORMAL LOW (ref 135–145)

## 2021-02-03 LAB — POCT I-STAT 7, (LYTES, BLD GAS, ICA,H+H)
Acid-base deficit: 2 mmol/L (ref 0.0–2.0)
Bicarbonate: 24.1 mmol/L (ref 20.0–28.0)
Calcium, Ion: 1.71 mmol/L (ref 1.15–1.40)
HCT: 27 % — ABNORMAL LOW (ref 39.0–52.0)
Hemoglobin: 9.2 g/dL — ABNORMAL LOW (ref 13.0–17.0)
O2 Saturation: 100 %
Patient temperature: 97.6
Potassium: 4.3 mmol/L (ref 3.5–5.1)
Sodium: 136 mmol/L (ref 135–145)
TCO2: 26 mmol/L (ref 22–32)
pCO2 arterial: 47.7 mmHg (ref 32.0–48.0)
pH, Arterial: 7.309 — ABNORMAL LOW (ref 7.350–7.450)
pO2, Arterial: 195 mmHg — ABNORMAL HIGH (ref 83.0–108.0)

## 2021-02-03 LAB — GLUCOSE, CAPILLARY
Glucose-Capillary: 139 mg/dL — ABNORMAL HIGH (ref 70–99)
Glucose-Capillary: 151 mg/dL — ABNORMAL HIGH (ref 70–99)
Glucose-Capillary: 171 mg/dL — ABNORMAL HIGH (ref 70–99)
Glucose-Capillary: 162 mg/dL — ABNORMAL HIGH (ref 70–99)
Glucose-Capillary: 155 mg/dL — ABNORMAL HIGH (ref 70–99)

## 2021-02-03 LAB — ECHOCARDIOGRAM LIMITED
Height: 69 in
Weight: 3527.36 oz

## 2021-02-03 LAB — HEPARIN LEVEL (UNFRACTIONATED): Heparin Unfractionated: 0.83 IU/mL — ABNORMAL HIGH (ref 0.30–0.70)

## 2021-02-03 LAB — MAGNESIUM: Magnesium: 2.6 mg/dL — ABNORMAL HIGH (ref 1.7–2.4)

## 2021-02-03 MED ORDER — PRISMASOL B22GK 4/0 22-4 MEQ/L REPLACEMENT SOLN
Status: DC
  Filled 2021-02-03 (×5): qty 5000

## 2021-02-03 MED ORDER — PRISMASOL B22GK 4/0 22-4 MEQ/L EC SOLN
Status: DC
  Filled 2021-02-03 (×29): qty 5000

## 2021-02-03 MED ORDER — PRISMASOL B22GK 4/0 22-4 MEQ/L REPLACEMENT SOLN
Status: DC
  Filled 2021-02-03 (×9): qty 5000

## 2021-02-03 MED ORDER — ATROPINE SULFATE 1 MG/10ML IJ SOSY
PREFILLED_SYRINGE | INTRAMUSCULAR | Status: AC
  Filled 2021-02-03: qty 10

## 2021-02-03 NOTE — Progress Notes (Signed)
  Echocardiogram 2D Echocardiogram limited has been performed.  Jennette Dubin 02/03/2021, 2:10 PM

## 2021-02-03 NOTE — Progress Notes (Signed)
[place] for IV heparin Indication: atrial fibrillation  Allergies  Allergen Reactions  . Penicillins Swelling, Rash and Other (See Comments)    Arms and eyes swell & skin breaks out  Has patient had a PCN reaction causing immediate rash, facial/tongue/throat swelling, SOB or lightheadedness with hypotension: Yes Has patient had a PCN reaction causing severe rash involving mucus membranes or skin necrosis: Yes Has patient had a PCN reaction that required hospitalization: No Has patient had a PCN reaction occurring within the last 10 years: No If all of the above answers are "NO", then may proceed with Cephalosporin use.   . Atorvastatin     Muscle pain in legs  . Rosuvastatin     Muscle pain in legs    Patient Measurements: Height: '5\' 9"'$  (175.3 cm) Weight: 100 kg (220 lb 7.4 oz) IBW/kg (Calculated) : 70.7 Heparin Dosing Weight: 95 kg  Vital Signs: Temp: 98 F (36.7 C) (03/29 1600) Temp Source: Axillary (03/29 1600) BP: 143/49 (03/29 1130) Pulse Rate: 49 (03/29 1630)  Labs: Recent Labs    02/01/21 0355 02/01/21 0928 02/02/21 0118 02/02/21 0240 02/02/21 0311 02/02/21 1559 02/03/21 0320 02/03/21 1633  HGB 6.9*   < > 7.4* 8.5* 7.5*  --  7.9*  --   HCT 22.6*   < > 23.9* 25.0* 23.7*  --  25.6*  --   PLT 187  --  176  --  179  --  145*  --   APTT 56*  --   --   --  76*  --  48* 64*  HEPARINUNFRC 1.28*  --   --   --  0.98*  --  0.83*  --   CREATININE 1.93*   < > 1.85*  --  1.78* 1.55* 1.43* 1.38*  TROPONINIHS  --   --  141*  --  146*  --   --   --    < > = values in this interval not displayed.    Estimated Creatinine Clearance: 65.5 mL/min (A) (by C-G formula based on SCr of 1.38 mg/dL (H)).   Medical History: Past Medical History:  Diagnosis Date  . Anemia    labs today indicate (09/27/11)  . Arthritis    right knee  . Bleeding ulcer    hx of, required 6 units blood  . Blood transfusion    6 units with ulcer repair  surgery  . Chronic kidney disease    stage IV, has fistula, never started diaylsis  . Complication of anesthesia    little slow to wake up with last surgery  . Dyspnea    with exertion  . Gout   . Hip dislocation, left ([place]) 10/09/2020  . Hypertension   . Osteoporosis   . Pneumonia     Medications:  Infusions:  . sodium chloride 5 mL/hr at 02/03/21 1600  . feeding supplement (VITAL 1.5 CAL) 1,000 mL (02/03/21 0200)  . fentaNYL infusion INTRAVENOUS 25 mcg/hr (02/03/21 1600)  . heparin 1,850 Units/hr (02/03/21 1600)  . norepinephrine (LEVOPHED) Adult infusion 6 mcg/min (02/03/21 1600)  . prismasol B22GK 4/0 500 mL/hr at 02/03/21 1105  . prismasol B22GK 4/0 1,500 mL/hr at 02/03/21 1430  . prismasol B22GK 4/0 300 mL/hr at 02/03/21 1103  . propofol (DIPRIVAN) infusion Stopped (01/31/21 0858)  . vasopressin Stopped (02/03/21 1155)    Assessment: 60 yo male on chronic anticoagulation with apixaban, was held for pericardial window.  Pharmacy asked to resume IV heparin. Last dose  of apixaban was 3/24 AM.  aPTT 64sec just below goal on heparin drip 1850 uts/hr. No problems with gtt infusing per RN. Hgb improved slightly after PRBCs given, up to 7.9 today. Of note patient's platelets have been trending down 190> 145 today. 4T score low, unlikely related to heparin as patient is on CRRT but will continue monitoring PLT closely.   Goal of Therapy:  Heparin level 0.3 - 0.5 APTT 66-85s (lower end of 66-102s goal range) Monitor platelets by anticoagulation protocol: Yes   Plan:  Increase IV heparin to 1,900 units/hr  Daily Heparin level and aptt  Monitor CBC, s/sx bleeding   Bonnita Nasuti Pharm.D. CPP, BCPS Clinical Pharmacist [phone] 02/03/2021 5:34 PM    Please refer to [facility] for unit-specific pharmacist

## 2021-02-03 NOTE — Progress Notes (Signed)
[facility] NEPHROLOGY PROGRESS NOTE  Assessment/ Plan: Pt is a 60 y.o. yo male  With PMH of HTN, DM, HLD, obesity, CKD stage V baseline creatinine level around 5 admitted with acute diastolic CHF noted when he presented for DCCV 02/01/2021.  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  -Continue with CRRT (Access: Right IJ temp line). Run even today.  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 Scot Dock that is mature, able to use for IHD.  Cardiac arrest, PEA 3/24; 3/28 another arrest = asystole noted when mucus plugged -ROSC 67mn. respiratory/hypoxia driven  Shock - multifactorial -pressor support and antibiotics per CCM, on midodrine as well  -Blood culture NGTD  Acute exacerbation of diastolic heart failure:  -Started CRRT for ultrafiltration. UFR see above  Pericardial effusion w/ tamponade -s/p pericardial window with thoracotomy 3/24   Anemia of chronic disease: Iron deficiency therefore ordered IV iron and ESA on 3/17.  Continue ESA qFriday.  Transfuse for hgb <7, hgb 7.9 today, per primary  Secondary hyperparathyroidism: phos controlled with rrt  Aflutter -cardiology following.  Was off a/c for surgery but now on heparin drip  Hypercalemia:  Presumably immobility; corrects to nearly 14.  Switch to 0Ca dialysate.  Check PTH and vit D 1,25  Subjective: I/Os yest 3.3 / 5.3 - now going to run net UF 0.  Still on NE and vaso support.  Palliative met with family yesterday - discussing GOC.   Objective Vital signs in last 24 hours: Vitals:   02/03/21 0915 02/03/21 0930 02/03/21 0945 02/03/21 1000  BP:      Pulse: (!) 54  (!) 36 (!) 54  Resp: 13 14 15 17   Temp:      TempSrc:      SpO2: 100%  100% 100%  Weight:      Height:        Weight change: -1.7 kg  Intake/Output Summary (Last 24 hours) at 02/03/2021 1038 Last data filed at 02/03/2021 1000 Gross per 24 hour  Intake 3112.54 ml  Output 5209 ml  Net -2096.46 ml       Labs: Basic Metabolic Panel: Recent Labs  Lab 02/02/21 0311 02/02/21 1559 02/03/21 0320  NA 133* 133* 134*  K 4.0 3.8 3.8  CL 100 101 100  CO2 26 26 26   GLUCOSE 222* 206* 148*  BUN 36* 32* 31*  CREATININE 1.78* 1.55* 1.43*  CALCIUM 12.1* 12.3* 12.6*  PHOS 4.8* 4.1 4.4   Liver Function Tests: Recent Labs  Lab 02/01/2021 0122 01/15/2021 0515 02/02/21 0311 02/02/21 1559 02/03/21 0320  AST 35  --   --   --   --   ALT 17  --   --   --   --   ALKPHOS 85  --   --   --   --   BILITOT 0.9  --   --   --   --   PROT 7.8  --   --   --   --   ALBUMIN 2.4*   < > 1.8* 1.8* 1.7*   < > = values in this interval not displayed.   No results for input(s): LIPASE, AMYLASE in the last 168 hours. No results for input(s): AMMONIA in  the last 168 hours. CBC: Recent Labs  Lab 01/31/21 1346 02/01/21 0355 02/01/21 0928 02/02/21 0118 02/02/21 0240 02/02/21 0311 02/03/21 0320  WBC 36.3* 38.0*  --  37.6*  --  38.3* 34.2*  HGB 7.1* 6.9*   < > 7.4* 8.5* 7.5* 7.9*  HCT 23.1* 22.6*   < > 23.9* 25.0* 23.7* 25.6*  MCV 103.1* 103.7*  --  101.7*  --  102.2* 102.8*  PLT 189 187  --  176  --  179 145*   < > = values in this interval not displayed.   Cardiac Enzymes: No results for input(s): CKTOTAL, CKMB, CKMBINDEX, TROPONINI in the last 168 hours. CBG: Recent Labs  Lab 02/02/21 1603 02/02/21 1944 02/02/21 2323 02/03/21 0320 02/03/21 0809  GLUCAP 188* 188* 186* 139* 151*    Iron Studies:  No results for input(s): IRON, TIBC, TRANSFERRIN, FERRITIN in the last 72 hours. Studies/Results: CT ABDOMEN PELVIS WO CONTRAST  Result Date: 02/01/2021 CLINICAL DATA:  Sepsis, status post cardioversion on 03/16. EXAM: CT ABDOMEN AND PELVIS WITHOUT CONTRAST TECHNIQUE: Multidetector CT imaging of the  abdomen and pelvis was performed following the standard protocol without IV contrast. COMPARISON:  Chest x-ray 02/01/2021, CT chest 11/30/2019. CT abdomen pelvis 04/29/2020 FINDINGS: Lower chest: At least small volume partially visualized pericardial effusion. Bilateral trace pleural effusions, left greater than right. Bilateral lower lobe heterogeneous consolidation. Marked mitral annular calcifications. Hepatobiliary: No focal liver abnormality. No gallstones, gallbladder wall thickening, or pericholecystic fluid. No biliary dilatation. Pancreas: No focal lesion. Normal pancreatic contour. No surrounding inflammatory changes. No main pancreatic ductal dilatation. Spleen: Normal in size without focal abnormality. Adrenals/Urinary Tract: No adrenal nodule bilaterally. Multiple subcentimeter hypo and hyperdensities within the kidneys are too small to characterize. There there is a 1.7 cm fluid density lesion within left kidney likely represents a simple renal cyst. No nephrolithiasis, no hydronephrosis, and no contour-deforming renal mass. No ureterolithiasis or hydroureter. The urinary bladder is decompressed. Stomach/Bowel: Enteric tube with tip terminating in the second portion of the duodenum. Stomach is within normal limits. No evidence of bowel wall thickening or dilatation. Stool throughout the ascending colon and transverse colon with no definite pneumatosis (6:38, 51). Scattered sigmoid diverticulosis. Appendix appears normal. Vascular/Lymphatic: Left femoral approach arterial and venous catheters terminating in the external iliac vessels. No abdominal aorta or iliac aneurysm. Mild atherosclerotic plaque of the aorta and its branches. No abdominal, pelvic, or inguinal lymphadenopathy. Reproductive: Not well visualized due to streak artifact originating from the femoral surgical hardware. Other: Upper abdomen surgical clips. Trace left upper abdomen simple free fluid. No intraperitoneal free gas. No organized  fluid collection. Musculoskeletal: Fat containing supraumbilical and periumbilical hernias (3:57). No suspicious lytic or blastic osseous lesions. No acute displaced fracture. Multilevel degenerative changes of the spine. Partially visualized left hip arthroplasty. IMPRESSION: 1. Bilateral lower lobe heterogeneous consolidations likely reflecting a combination of atelectasis and infection/inflammation. 2. At least small volume partially visualized pericardial effusion. 3. Bilateral trace pleural effusions, left greater than right. 4. Stool throughout the ascending colon and transverse colon with no definite pneumatosis. Limited evaluation for ischemia on this noncontrast study. Given appearance of these loops of large bowel, recommend close clinical follow-up with serial abdominal exams as pneumatosis cannot be fully excluded. Correlate with lactate levels. 5. Scattered sigmoid diverticulosis with no findings of acute diverticulitis. 6. Multiple subcentimeter hyper and hypodense renal lesions that are too small to characterize. 7. Aortic Atherosclerosis (ICD10-I70.0) including marked mitral annular calcifications. Electronically Signed   By: Thomasena Edis  Mckinley Jewel M.D.   On: 02/01/2021 18:06   DG Chest Port 1 View  Result Date: 02/02/2021 CLINICAL DATA:  Status post cardiac arrest EXAM: PORTABLE CHEST 1 VIEW COMPARISON:  02/01/2021 FINDINGS: Cardiac shadow is enlarged but stable. Endotracheal tube, feeding catheter and right jugular central line are again seen and stable. Increased airspace opacity is noted likely representing patchy edema related to the recent arrest. No bony abnormality is seen. IMPRESSION: Increasing patchy airspace opacity likely representing edema. Tubes and lines as described. Electronically Signed   By: Inez Catalina M.D.   On: 02/02/2021 00:24   DG CHEST PORT 1 VIEW  Result Date: 02/01/2021 CLINICAL DATA:  Questionable sepsis EXAM: PORTABLE CHEST 1 VIEW COMPARISON:  Three days ago FINDINGS:  Endotracheal tube with tip just below the clavicular heads. The feeding tube at least reaches the stomach. Right IJ line with tip near the SVC origin. Cardiomegaly. Straight margin along the right heart border suggesting lobar collapse. The right diaphragm is less well seen than on remote imaging. There is a left chest tube in place. No visible pneumothorax. IMPRESSION: 1. Increased left pleural effusion. 2. Suspect right lower lobe collapse. Electronically Signed   By: Monte Fantasia M.D.   On: 02/01/2021 11:09    Medications: Infusions: . sodium chloride 5 mL/hr at 02/03/21 1000  . feeding supplement (VITAL 1.5 CAL) 1,000 mL (02/03/21 0200)  . fentaNYL infusion INTRAVENOUS 75 mcg/hr (02/03/21 1000)  . heparin 1,850 Units/hr (02/03/21 1000)  . norepinephrine (LEVOPHED) Adult infusion 15 mcg/min (02/03/21 1000)  . prismasol B22GK 4/0    . prismasol B22GK 4/0    . prismasol B22GK 4/0    . propofol (DIPRIVAN) infusion Stopped (01/31/21 0858)  . vasopressin 0.02 Units/min (02/03/21 1013)    Scheduled Medications: . B-complex with vitamin C  1 tablet Per Tube Daily  . chlorhexidine gluconate (MEDLINE KIT)  15 mL Mouth Rinse BID  . Chlorhexidine Gluconate Cloth  6 each Topical Q0600  . clindamycin   Topical BID  . darbepoetin (ARANESP) injection - DIALYSIS  150 mcg Intravenous Q Fri-HD  . diclofenac Sodium  2 g Topical QID  . docusate  100 mg Per Tube BID  . feeding supplement (PROSource TF)  45 mL Per Tube QID  . insulin aspart  1-3 Units Subcutaneous Q4H  . insulin aspart  4 Units Subcutaneous Q4H  . loratadine  10 mg Per Tube Daily  . mouth rinse  15 mL Mouth Rinse 10 times per day  . midodrine  15 mg Per Tube TID WC  . pantoprazole sodium  40 mg Per Tube Daily  . polyethylene glycol  17 g Per Tube Daily  . sodium chloride flush  10-40 mL Intracatheter Q12H    have reviewed scheduled and prn medications.  Physical Exam: General: ill appearing, intubated  Heart: s1s2, irreg  irreg, pericardial drain in place with significnat outpt still (249m noted just after arrest overnight), left chest swelling Lungs: diminished air entry bibasilar, intubated Abdomen:soft, Non-tender, distended Extremities: trace Bilateral lower extremity edema Neuro: awake and alert Dialysis Access: LUE AV fistula, rij temp HD catheter  LRia CommentA Seanpatrick Maisano 02/03/2021,10:38 AM  LOS: 13 days

## 2021-02-03 NOTE — Progress Notes (Signed)
eLink Physician-Brief Progress Note Patient Name: Mark [name] DOB: [birth year] MRN: [id number]   Date of Service  02/03/2021  HPI/Events of Note  Sinus Bradycardia - HR = 51. BP = 148/52. It appears that Amiodarone has now been D/C. Having pauses of several seconds tonight. No B-Blockers, Digoxin or Ca++ Channel blockers. Last K+ at 433 PM = 4.0.   eICU Interventions  Plan: 1. Please have transcutaneous pads on patient.  2. Continue to monitor HR off Amiodarone.      Intervention Category Major Interventions: Arrhythmia - evaluation and management  Alana Dayton Eugene 02/03/2021, 7:20 PM

## 2021-02-03 NOTE — Progress Notes (Signed)
NAME:  Mark [name], MRN:  [id number], DOB:  1961/10/11, LOS: 70 ADMISSION DATE:  01/30/2021, CONSULTATION DATE: 3/19 REFERRING MD:  Dr. Erlinda Hong, CHIEF COMPLAINT:  TRH    History of Present Illness:  60 y/o M who presented to [facility] on 3/16 for elective cardioversion but presented with shortness of breath, leg swelling and palpitations.   The patient has known CKD IV with baseline sr cr of 5, conservative management, L arm AVF in place in anticipation of HD.  On presentation, he was found to have significant volume overload needing admission per TRH.  After cardioversion he became transiently hypotensive and briefly required neosynephrine gtt.  He was admitted and Nephrology was consulted. Hospital course notable for Proteus UTI, anemia, intolerance to initial HD (chest pain, SVT) requiring brief CVVHD and BIPAP use.  In the ICU he continued to require bipap. He was transitioned to [facility] and had one session on 3/23 with 3L removed / -750 for day.  In the early am of 3/24 the patient pulled his bipap mask off and suffered a respiratory arrest requiring intubation and one round of CPR.   Pertinent  Medical History  CKD IV - HD started this admit (01/14/2021) HTN HLD Chronic Diastolic CHF  Moderate AS Atrial Flutter - on Eliquis  Obesity  Anemia of Chronic Disease   Significant Hospital Events: Including procedures, antibiotic start and stop dates in addition to other pertinent events    3/16 Admit after cardioversion, SOB, leg swelling / palpitations prior to presentation   3/19 PCCM consulted   3/20 CRRT started  3/21 Improved mental status, wore BiPAP overnight. Staph epidermis growing from [facility]. UC growing proteus, abx changed from ceftriaxone to cefepime.  Fever 100.2, -3.6L in last 24 hours.    3/21 CVVHD stopped.  3/23 Tolerated iHD  3/24 Respiratory arrest, PEA, (pulled bipap off), intubated, re-cultured, ECHO tamponade, Pericardial window, Pericardial drain 28Fr by Julien Girt  3/25  palliative care consulted   3/27 CVVHD, pressors levo + vaso   CT chest showed no clear intraabdominal abscess.  3/27 brief overnight arrest when bit down on ETT  Interim History / Subjective:   Bradycardia last pm.   Objective   Blood pressure (!) 165/58, pulse (!) 55, temperature 98.6 F (37 C), temperature source Axillary, resp. rate 19, height '5\' 9"'$  (1.753 m), weight 100 kg, SpO2 96 %.    Vent Mode: PSV;CPAP FiO2 (%):  [45 %-50 %] 45 % Set Rate:  [22 bmp] 22 bmp Vt Set:  [560 mL] 560 mL PEEP:  [5 cmH20] 5 cmH20 Pressure Support:  [12 cmH20] 12 cmH20 Plateau Pressure:  [19 cmH20-26 cmH20] 19 cmH20 IPAP 20 EPAP 8  Intake/Output Summary (Last 24 hours) at 02/03/2021 0925 Last data filed at 02/03/2021 0900 Gross per 24 hour  Intake 3327.92 ml  Output 5431 ml  Net -2103.08 ml   Filed Weights   01/31/21 0456 02/02/21 0500 02/03/21 0355  Weight: 108.1 kg 101.7 kg 100 kg    Examination: General: critically ill, intubated   HEENT: ETT in place,   Neuro: sedated on mech vent, opens eyes to voice, will follow commands weakly.   CV: Irregularly, irregular, s1 s2 normal  PULM: normal vesicular breath sounds bilaterally. Tolerating PSV GI: soft nt nd  Extremities: edema much improved.  Skin: no rash  VVS: left AV fistula   Laboratory investigations reviewed.     Resolved Hospital Problem list     Assessment & Plan:   PEA Arrest suspect  respiratory driven as pulled bipap off + tamponade Cardiac Tamponade due to uremia.  Acute Hypercarbic Respiratory Failure in setting of Acute Diastolic CHF CKD V / New ESRD  Acute Exacerbation of dCHF Atrial Fibrillation / Flutter  - now bradycardic Chronic Hypotension Acute on Chronic Diastolic HF Moderate Aortic Stenosis  Macrocytic Anemia  BPH Septic Shock  Proteus UTI Chronic Hydradenitis / L Groin Wound, Small Wound on Right Thigh Leukocytosis -- stable, no abscess on CT Left Shoulder Pain  Moderate Protein Calorie  Malnutrition GOC:  Plan:  - Goal at this point this to get the patient to tolerate iHD. LV function is normal, so should be able tolerate now that tamponade resolved. - Echo today to rule out residual pericardial effusion.  - Suspect that fluid overload is largely due to worsening renal function.  - Tolerating SBT - will try to wean sedation and extubate as this will also likely allow for vasopressor weaning.  - Stop amiodarone given marked bradycardia even on NE.   Best practice (evaluated daily)  Diet:  TF Pain/Anxiety/Delirium protocol (if indicated): Yes (RASS goal -1) on fentanyl VAP protocol (if indicated): Not indicated DVT prophylaxis: Systemic AC GI prophylaxis: PPI Glucose control:  Basal insulin No Central venous access:  Yes, and it is still needed Arterial line:  N/A Foley:  N/A Mobility:  bed rest  PT consulted: N/A Last date of multidisciplinary goals of care discussion: Palliative care meeting 01/31/2021.  I spoke to patient's brother 3/29 at told him that ultimate goal is to get patient to tolerate intermittent dialysis. If this is not possible then the patient would be in an ultimately non-survivable situation.  Code Status:  full code Disposition: ICU   CRITICAL CARE Performed by: Kipp Brood   Total critical care time: 40 minutes  Critical care time was exclusive of separately billable procedures and treating other patients.  Critical care was necessary to treat or prevent imminent or life-threatening deterioration.  Critical care was time spent personally by me on the following activities: development of treatment plan with patient and/or surrogate as well as nursing, discussions with consultants, evaluation of patient's response to treatment, examination of patient, obtaining history from patient or surrogate, ordering and performing treatments and interventions, ordering and review of laboratory studies, ordering and review of radiographic studies, pulse  oximetry, re-evaluation of patient's condition and participation in multidisciplinary rounds.  Kipp Brood, MD [facility] ICU Physician [place]  Pager: [phone] Mobile: [phone] After hours: [phone].   02/03/2021 9:25 AM

## 2021-02-03 NOTE — Progress Notes (Addendum)
Patient appears to drop HR and have pauses when stimulated. D/t this, patient did not get a full turn with sheet change during bath. Sheets are currently clean without stains. Q2H turns are still being completed with pillow under hips. Will continue to assess for stability and readiness to do full turn and complete linen change.

## 2021-02-03 NOTE — Progress Notes (Signed)
During shift report, patient noted to have significant and frequent pauses. Patient placed on external pacer/defib pads, Zoll placed in room and attached to pads to monitor. Atropine and epinephrine at bedside in case of emergency. Unable to obtain oral or axillary temperature. Warming blanket applied. Notified Elink. Patient placed back on full vent support. ABG obtained. No changes to plan of care at this time.

## 2021-02-03 NOTE — Progress Notes (Signed)
[place] for IV heparin Indication: atrial fibrillation  Allergies  Allergen Reactions  . Penicillins Swelling, Rash and Other (See Comments)    Arms and eyes swell & skin breaks out  Has patient had a PCN reaction causing immediate rash, facial/tongue/throat swelling, SOB or lightheadedness with hypotension: Yes Has patient had a PCN reaction causing severe rash involving mucus membranes or skin necrosis: Yes Has patient had a PCN reaction that required hospitalization: No Has patient had a PCN reaction occurring within the last 10 years: No If all of the above answers are "NO", then may proceed with Cephalosporin use.   . Atorvastatin     Muscle pain in legs  . Rosuvastatin     Muscle pain in legs    Patient Measurements: Height: '5\' 9"'$  (175.3 cm) Weight: 100 kg (220 lb 7.4 oz) IBW/kg (Calculated) : 70.7 Heparin Dosing Weight: 95 kg  Vital Signs: Temp: 98.6 F (37 C) (03/29 0800) Temp Source: Axillary (03/29 0800) BP: 165/58 (03/29 0828) Pulse Rate: 55 (03/29 0828)  Labs: Recent Labs    02/01/21 0355 02/01/21 [id number] 02/02/21 0118 02/02/21 0240 02/02/21 0311 02/02/21 1559 02/03/21 0320  HGB 6.9*   < > 7.4* 8.5* 7.5*  --  7.9*  HCT 22.6*   < > 23.9* 25.0* 23.7*  --  25.6*  PLT 187  --  176  --  179  --  145*  APTT 56*  --   --   --  76*  --  48*  HEPARINUNFRC 1.28*  --   --   --  0.98*  --  0.83*  CREATININE 1.93*   < > 1.85*  --  1.78* 1.55* 1.43*  TROPONINIHS  --   --  141*  --  146*  --   --    < > = values in this interval not displayed.    Estimated Creatinine Clearance: 63.2 mL/min (A) (by C-G formula based on SCr of 1.43 mg/dL (H)).   Medical History: Past Medical History:  Diagnosis Date  . Anemia    labs today indicate (09/27/11)  . Arthritis    right knee  . Bleeding ulcer    hx of, required 6 units blood  . Blood transfusion    6 units with ulcer repair surgery  . Chronic kidney disease    stage IV, has  fistula, never started diaylsis  . Complication of anesthesia    little slow to wake up with last surgery  . Dyspnea    with exertion  . Gout   . Hip dislocation, left ([place]) 10/09/2020  . Hypertension   . Osteoporosis   . Pneumonia     Medications:  Infusions:  .  prismasol BGK 4/2.5 300 mL/hr at 02/03/21 0245  . sodium chloride 5 mL/hr at 02/03/21 0800  . feeding supplement (VITAL 1.5 CAL) 1,000 mL (02/03/21 0200)  . fentaNYL infusion INTRAVENOUS 125 mcg/hr (02/03/21 0800)  . heparin 1,650 Units/hr (02/03/21 0800)  . norepinephrine (LEVOPHED) Adult infusion 16 mcg/min (02/03/21 0800)  . prismasol BGK 2/2.5 dialysis solution 1,500 mL/hr at 02/03/21 0604  . prismasol BGK 2/2.5 replacement solution 500 mL/hr at 02/03/21 0026  . propofol (DIPRIVAN) infusion Stopped (01/31/21 0858)  . vasopressin 0.02 Units/min (02/03/21 0800)    Assessment: 60 yo male on chronic anticoagulation with apixaban, was held for pericardial window.  Pharmacy asked to resume IV heparin. Last dose of apixaban was 3/24 AM.  aPTT this AM subtherapeutic at 48, not  correlating with HL yet. No problems with gtt infusing per RN. Hgb improved slightly after PRBCs given, up to 7.9 today. Of note patient's platelets have been trending down to 145 today. 4T score low, unlikely related to heparin as patient is on CRRT but will continue monitoring PLT closely.   Goal of Therapy:  Heparin level 0.3 - 0.5 APTT 66-85s (lower end of 66-102s goal range) Monitor platelets by anticoagulation protocol: Yes   Plan:  Increase IV heparin to 1,850 units/hr  Follow up with 8-hr aPTT  Monitor CBC, s/sx bleeding  Mercy Riding, PharmD PGY1 Acute Care Pharmacy Resident Please refer to [facility] for unit-specific pharmacist

## 2021-02-03 NOTE — Op Note (Signed)
Procedure(s): PERICARDIAL WINDOW WITH ANTERIOR THORACOTOMY APPROACH Procedure Note  Mark [name] male 60 y.o. 01/15/2021 Procedure(s) and Anesthesia Type:    * PERICARDIAL WINDOW WITH ANTERIOR THORACOTOMY APPROACH - General  Surgeon(s) and Role:    * Wonda Olds, MD - Primary   Indications: The patient was admitted to the hospital with renal failure.  He sustained respiratory arrest and post arrest evaluation demonstrated a large pericardial effusion with tamponade physiology.  He is taken to the operating room urgently for pericardial window     Surgeon: Wonda Olds   Assistants: Staff  Anesthesia: General endotracheal anesthesia  ASA Class: 5    Procedure Detail  PERICARDIAL WINDOW WITH ANTERIOR THORACOTOMY APPROACH Using 2 physician urgent implied consent, the patient taken the operating room on the above listed date.  Is placed in the supine position on the operative table.  Anesthesia was confirmed by the general tracheal technique.  A bump was placed beneath the left side.  The chest was cleansed with Betadine paint and soap.  A preop surgical pause was performed.  An incision was made in the midaxillary line in the fifth intercostal space.  The chest was entered safely without injury to underlying structures.  The pericardium is visible through the thoracotomy incision.  The pericardium was incised and a large amount of bloody effluent was encountered.  This was sent for culture and pathology.  A quarter sized piece of pericardium was then excised and sent separately as a specimen.  A drain was placed into the pericardium through separate stab incision.  The wound was then an S ties with Exparel solution and closed in layers.  Sterile dressings were applied.  All sponge instruments and needle counts were correct.  Estimated Blood Loss: Minimal         Drains: JACKSON-PRATT (JP)          Blood Given: none          Specimens: Pericardium and pericardial  fluid         Implants: none        Complications:  * No complications entered in OR log *         Disposition: ICU - intubated and critically ill.         Condition: stable

## 2021-02-03 NOTE — Progress Notes (Signed)
Shift events: weaned sedation and pressors, new goal to pull even on CRRT. SBT all day, plan to extubate tomorrow.   N: opens eyes to command and to voice, follows commands moderately on all extremities. Afebrile. Sedated with fentanyl gtt, weaned from 122mg to 21m and required two prn bolus 5086m   R: ETT, clear/dim lungs, scant thick/tan secretions. CPAP mode 40% + 5 from 0800 through shift change, twice alarming for tachypnea but resolved with prn fentanyl. No apnea or low TV alarms.   CV: Aflutter, rate 40-50. BP MAP goal greater than 65 met with levophed currently at 3mc36min. Vaso weaned off. Chest tube with small serosang output. Heparin gtt remains on, increased per pharm.   GI: NGT infusing tube feeds at goal. No BM, stool regimen given. BG covered with SS insulin.   GU: No foley, oliguria. CRRT running wihtout issues. All baths changed to 4k/0, same rates. Started shift with goal -100/hr, per CCM new goal to pull even. Ending shift even.    Brother and sister-in-law at bedside. No labs of concern.  Tenecia Ignasiak RN

## 2021-02-04 ENCOUNTER — Inpatient Hospital Stay (HOSPITAL_COMMUNITY): Payer: 59

## 2021-02-04 DIAGNOSIS — R0781 Pleurodynia: Secondary | ICD-10-CM | POA: Diagnosis not present

## 2021-02-04 LAB — RENAL FUNCTION PANEL
Albumin: 1.8 g/dL — ABNORMAL LOW (ref 3.5–5.0)
Albumin: 1.7 g/dL — ABNORMAL LOW (ref 3.5–5.0)
Anion gap: 7 (ref 5–15)
Anion gap: 6 (ref 5–15)
BUN: 32 mg/dL — ABNORMAL HIGH (ref 8–23)
BUN: 34 mg/dL — ABNORMAL HIGH (ref 8–23)
CO2: 21 mmol/L — ABNORMAL LOW (ref 22–32)
CO2: 19 mmol/L — ABNORMAL LOW (ref 22–32)
Calcium: 11.9 mg/dL — ABNORMAL HIGH (ref 8.9–10.3)
Calcium: 13 mg/dL — ABNORMAL HIGH (ref 8.9–10.3)
Chloride: 104 mmol/L (ref 98–111)
Chloride: 108 mmol/L (ref 98–111)
Creatinine, Ser: 1.41 mg/dL — ABNORMAL HIGH (ref 0.61–1.24)
Creatinine, Ser: 1.42 mg/dL — ABNORMAL HIGH (ref 0.61–1.24)
GFR, Estimated: 57 mL/min — ABNORMAL LOW (ref >60)
GFR, Estimated: 56 mL/min — ABNORMAL LOW (ref >60)
Glucose, Bld: 201 mg/dL — ABNORMAL HIGH (ref 70–99)
Glucose, Bld: 133 mg/dL — ABNORMAL HIGH (ref 70–99)
Phosphorus: 5.4 mg/dL — ABNORMAL HIGH (ref 2.5–4.6)
Phosphorus: 6.4 mg/dL — ABNORMAL HIGH (ref 2.5–4.6)
Potassium: 4.4 mmol/L (ref 3.5–5.1)
Potassium: 4.8 mmol/L (ref 3.5–5.1)
Sodium: 132 mmol/L — ABNORMAL LOW (ref 135–145)
Sodium: 133 mmol/L — ABNORMAL LOW (ref 135–145)

## 2021-02-04 LAB — APTT: aPTT: 55 seconds — ABNORMAL HIGH (ref 24–36)

## 2021-02-04 LAB — CBC
HCT: 27.1 % — ABNORMAL LOW (ref 39.0–52.0)
Hemoglobin: 8 g/dL — ABNORMAL LOW (ref 13.0–17.0)
MCH: 31.3 pg (ref 26.0–34.0)
MCHC: 29.5 g/dL — ABNORMAL LOW (ref 30.0–36.0)
MCV: 105.9 fL — ABNORMAL HIGH (ref 80.0–100.0)
Platelets: 125 10*3/uL — ABNORMAL LOW (ref 150–400)
RBC: 2.56 MIL/uL — ABNORMAL LOW (ref 4.22–5.81)
RDW: 18.6 % — ABNORMAL HIGH (ref 11.5–15.5)
WBC: 32.2 10*3/uL — ABNORMAL HIGH (ref 4.0–10.5)
nRBC: 2.3 % — ABNORMAL HIGH (ref 0.0–0.2)

## 2021-02-04 LAB — GLUCOSE, CAPILLARY
Glucose-Capillary: 115 mg/dL — ABNORMAL HIGH (ref 70–99)
Glucose-Capillary: 130 mg/dL — ABNORMAL HIGH (ref 70–99)
Glucose-Capillary: 136 mg/dL — ABNORMAL HIGH (ref 70–99)
Glucose-Capillary: 143 mg/dL — ABNORMAL HIGH (ref 70–99)
Glucose-Capillary: 185 mg/dL — ABNORMAL HIGH (ref 70–99)
Glucose-Capillary: 186 mg/dL — ABNORMAL HIGH (ref 70–99)
Glucose-Capillary: 189 mg/dL — ABNORMAL HIGH (ref 70–99)

## 2021-02-04 LAB — MAGNESIUM: Magnesium: 2.7 mg/dL — ABNORMAL HIGH (ref 1.7–2.4)

## 2021-02-04 LAB — HEPARIN LEVEL (UNFRACTIONATED)
Heparin Unfractionated: 0.69 IU/mL (ref 0.30–0.70)
Heparin Unfractionated: 0.6 IU/mL (ref 0.30–0.70)

## 2021-02-04 MED ORDER — SODIUM CHLORIDE 0.9 % IV SOLN
INTRAVENOUS | Status: DC
  Administered 2021-02-04: 10 mL/h via INTRAVENOUS

## 2021-02-04 MED ORDER — FENTANYL CITRATE (PF) 100 MCG/2ML IJ SOLN
50.0000 ug | INTRAMUSCULAR | Status: DC | PRN
Start: 2021-02-04 — End: 2021-02-06
  Administered 2021-02-04 – 2021-02-05 (×2): 100 ug via INTRAVENOUS
  Filled 2021-02-04 (×2): qty 2

## 2021-02-04 MED ORDER — INSULIN ASPART 100 UNIT/ML ~~LOC~~ SOLN
0.0000 [IU] | SUBCUTANEOUS | Status: DC
  Administered 2021-02-04: 1 [IU] via SUBCUTANEOUS
  Administered 2021-02-05 (×2): 2 [IU] via SUBCUTANEOUS
  Administered 2021-02-05 (×2): 1 [IU] via SUBCUTANEOUS
  Administered 2021-02-05: 2 [IU] via SUBCUTANEOUS

## 2021-02-04 MED ORDER — OXYCODONE HCL 5 MG PO TABS: 5.0000 mg | ORAL_TABLET | ORAL | Status: DC | PRN

## 2021-02-04 MED ORDER — OXYCODONE HCL 5 MG PO TABS: 5.0000 mg | ORAL_TABLET | ORAL | Status: DC

## 2021-02-04 MED ORDER — OXYCODONE HCL 5 MG PO TABS
10.0000 mg | ORAL_TABLET | ORAL | Status: DC
  Administered 2021-02-04 – 2021-02-05 (×9): 10 mg
  Filled 2021-02-04 (×8): qty 2

## 2021-02-04 MED ORDER — MIDAZOLAM HCL 2 MG/2ML IJ SOLN
2.0000 mg | Freq: Once | INTRAMUSCULAR | Status: AC
  Administered 2021-02-04: 2 mg via INTRAVENOUS

## 2021-02-04 MED ORDER — OXYCODONE HCL 5 MG PO TABS
10.0000 mg | ORAL_TABLET | ORAL | Status: DC
  Filled 2021-02-04: qty 2

## 2021-02-04 MED ORDER — MIDODRINE HCL 5 MG PO TABS: 15.0000 mg | ORAL_TABLET | Freq: Three times a day (TID) | ORAL | Status: DC

## 2021-02-04 MED ORDER — INSULIN ASPART 100 UNIT/ML ~~LOC~~ SOLN
0.0000 [IU] | Freq: Three times a day (TID) | SUBCUTANEOUS | Status: DC
  Administered 2021-02-04: 2 [IU] via SUBCUTANEOUS

## 2021-02-04 MED ORDER — MUPIROCIN 2 % EX OINT
1.0000 "application " | TOPICAL_OINTMENT | Freq: Two times a day (BID) | CUTANEOUS | Status: DC
  Administered 2021-02-05 (×3): 1 via NASAL
  Filled 2021-02-04: qty 22

## 2021-02-04 MED ORDER — MIDODRINE HCL 5 MG PO TABS: 5.0000 mg | ORAL_TABLET | Freq: Three times a day (TID) | ORAL | Status: DC

## 2021-02-04 MED ORDER — DOPAMINE-DEXTROSE 3.2-5 MG/ML-% IV SOLN
0.0000 ug/kg/min | INTRAVENOUS | Status: DC
  Administered 2021-02-04: 10 ug/kg/min via INTRAVENOUS
  Administered 2021-02-04: 5 ug/kg/min via INTRAVENOUS
  Administered 2021-02-05 (×2): 10 ug/kg/min via INTRAVENOUS
  Administered 2021-02-06: 20 ug/kg/min via INTRAVENOUS
  Filled 2021-02-04 (×5): qty 250

## 2021-02-04 MED FILL — Medication: Qty: 1 | Status: AC

## 2021-02-04 NOTE — Progress Notes (Addendum)
[place] for IV heparin Indication: atrial fibrillation  Allergies  Allergen Reactions  . Penicillins Swelling, Rash and Other (See Comments)    Arms and eyes swell & skin breaks out  Has patient had a PCN reaction causing immediate rash, facial/tongue/throat swelling, SOB or lightheadedness with hypotension: Yes Has patient had a PCN reaction causing severe rash involving mucus membranes or skin necrosis: Yes Has patient had a PCN reaction that required hospitalization: No Has patient had a PCN reaction occurring within the last 10 years: No If all of the above answers are "NO", then may proceed with Cephalosporin use.   . Atorvastatin     Muscle pain in legs  . Rosuvastatin     Muscle pain in legs    Patient Measurements: Height: '5\' 9"'$  (175.3 cm) Weight: 99.4 kg (219 lb 2.2 oz) IBW/kg (Calculated) : 70.7 Heparin Dosing Weight: 95 kg  Vital Signs: Temp: 97.9 F (36.6 C) (03/30 1937) Temp Source: Axillary (03/30 1937) BP: 153/51 (03/30 1105) Pulse Rate: 58 (03/30 1915)  Labs: Recent Labs    02/02/21 0118 02/02/21 0240 02/02/21 0311 02/02/21 1559 02/03/21 0320 02/03/21 1633 02/03/21 1929 02/04/21 0328 02/04/21 1600 02/04/21 1900  HGB 7.4*   < > 7.5*  --  7.9*  --  9.2* 8.0*  --   --   HCT 23.9*   < > 23.7*  --  25.6*  --  27.0* 27.1*  --   --   PLT 176  --  179  --  145*  --   --  125*  --   --   APTT  --    < > 76*  --  48* 64*  --  55*  --   --   HEPARINUNFRC  --    < > 0.98*  --  0.83*  --   --  0.69  --  0.60  CREATININE 1.85*  --  1.78*   < > 1.43* 1.38*  --  1.41* 1.42*  --   TROPONINIHS 141*  --  146*  --   --   --   --   --   --   --    < > = values in this interval not displayed.    Estimated Creatinine Clearance: 63.5 mL/min (A) (by C-G formula based on SCr of 1.42 mg/dL (H)).   Medical History: Past Medical History:  Diagnosis Date  . Anemia    labs today indicate (09/27/11)  . Arthritis    right knee  .  Bleeding ulcer    hx of, required 6 units blood  . Blood transfusion    6 units with ulcer repair surgery  . Chronic kidney disease    stage IV, has fistula, never started diaylsis  . Complication of anesthesia    little slow to wake up with last surgery  . Dyspnea    with exertion  . Gout   . Hip dislocation, left ([place]) 10/09/2020  . Hypertension   . Osteoporosis   . Pneumonia     Medications:  Infusions:  . sodium chloride 10 mL/hr at 02/04/21 2000  . sodium chloride    . DOPamine 10 mcg/kg/min (02/04/21 2000)  . feeding supplement (VITAL 1.5 CAL) 1,000 mL (02/03/21 2250)  . fentaNYL infusion INTRAVENOUS 75 mcg/hr (02/04/21 2000)  . heparin 1,900 Units/hr (02/04/21 2000)  . norepinephrine (LEVOPHED) Adult infusion 4 mcg/min (02/04/21 2000)  . prismasol B22GK 4/0 500 mL/hr at 02/04/21 1725  .  prismasol B22GK 4/0 1,500 mL/hr at 02/04/21 1752  . prismasol B22GK 4/0 300 mL/hr at 02/04/21 0358  . vasopressin Stopped (02/03/21 1155)    Assessment: 60 yo male on chronic anticoagulation with apixaban, was held for pericardial window.  Pharmacy asked to resume IV heparin. Last dose of apixaban was 3/24 AM.  Heparin drip 1900 uts/hr heparin level 0.6 at upper end of goal but down trending - can still be affected by apixaban in setting of AKI and prolonged apixaban clearance, aptt have been < goal.   Last h/h stable, pltc slightly lower today also on CRRT.   Noted pupil change this evening and plan to hold heparin and check head CT No bleeding on head CT > resume heparin at previous rate    Goal of Therapy:  Heparin level 0.3 - 0.5 Monitor platelets by anticoagulation protocol: Yes  Plan:  Continue heparin at 1900 uts/hr   [place].D. CPP, BCPS Clinical Pharmacist [phone] 02/04/2021 8:47 PM    Please refer to [facility] for unit-specific pharmacist

## 2021-02-04 NOTE — Progress Notes (Signed)
1600-1630 bedside placement of introducer and TV wires by Dr. Kipp Brood, accompanied by med student, this RN, cath lab RN, and AD Lattie Haw. Safety pause completed. VSS stable, requiring transcutaneous pacing frequently during procedure.   Camri Molloy RN

## 2021-02-04 NOTE — Progress Notes (Signed)
6 Days Post-Op Procedure(s) (LRB): PERICARDIAL WINDOW WITH ANTERIOR THORACOTOMY APPROACH (Left) Subjective: Intubated/sedated  Objective: Vital signs in last 24 hours: Temp:  [97.6 F (36.4 C)-98.6 F (37 C)] 98.4 F (36.9 C) (03/30 0406) Pulse Rate:  [25-57] 51 (03/30 0700) Cardiac Rhythm: Atrial flutter (03/30 0400) Resp:  [11-25] 21 (03/30 0700) BP: (143-165)/(46-58) 159/46 (03/30 0400) SpO2:  [88 %-100 %] 100 % (03/30 0700) Arterial Line BP: (122-174)/(39-59) 158/51 (03/30 0700) FiO2 (%):  [40 %-50 %] 40 % (03/30 0400) Weight:  [99.4 kg] 99.4 kg (03/30 0342)  Hemodynamic parameters for last 24 hours:    Intake/Output from previous day: 03/29 0701 - 03/30 0700 In: 2811.1 [I.V.:981.1; NG/GT:1830] Out: 2901 [Chest Tube:140] Intake/Output this shift: No intake/output data recorded.  General appearance: uncooperative Neurologic: unable to fully assess Heart: regular rate and rhythm, S1, S2 normal, no murmur, click, rub or gallop Lungs: clear to auscultation bilaterally  Lab Results: Recent Labs    02/03/21 0320 02/03/21 1929 02/04/21 0328  WBC 34.2*  --  32.2*  HGB 7.9* 9.2* 8.0*  HCT 25.6* 27.0* 27.1*  PLT 145*  --  125*   BMET:  Recent Labs    02/03/21 1633 02/03/21 1929 02/04/21 0328  NA 134* 136 132*  K 4.0 4.3 4.4  CL 104  --  104  CO2 23  --  21*  GLUCOSE 177*  --  201*  BUN 31*  --  32*  CREATININE 1.38*  --  1.41*  CALCIUM 11.7*  --  11.9*    PT/INR: No results for input(s): LABPROT, INR in the last 72 hours. ABG    Component Value Date/Time   PHART 7.309 (L) 02/03/2021 1929   HCO3 24.1 02/03/2021 1929   TCO2 26 02/03/2021 1929   ACIDBASEDEF 2.0 02/03/2021 1929   O2SAT 100.0 02/03/2021 1929   CBG (last 3)  Recent Labs    02/03/21 1947 02/03/21 2351 02/04/21 0327  GLUCAP 155* 143* 189*    Assessment/Plan: S/P Procedure(s) (LRB): PERICARDIAL WINDOW WITH ANTERIOR THORACOTOMY APPROACH (Left) He does not appear to have clinical  tamponade, but the echo report from yesterday is a little unclear  May consider TEE Keep chest tube for now   LOS: 14 days    Wonda Olds 02/04/2021

## 2021-02-04 NOTE — Progress Notes (Signed)
[place] for IV heparin Indication: atrial fibrillation  Allergies  Allergen Reactions  . Penicillins Swelling, Rash and Other (See Comments)    Arms and eyes swell & skin breaks out  Has patient had a PCN reaction causing immediate rash, facial/tongue/throat swelling, SOB or lightheadedness with hypotension: Yes Has patient had a PCN reaction causing severe rash involving mucus membranes or skin necrosis: Yes Has patient had a PCN reaction that required hospitalization: No Has patient had a PCN reaction occurring within the last 10 years: No If all of the above answers are "NO", then may proceed with Cephalosporin use.   . Atorvastatin     Muscle pain in legs  . Rosuvastatin     Muscle pain in legs    Patient Measurements: Height: '5\' 9"'$  (175.3 cm) Weight: 99.4 kg (219 lb 2.2 oz) IBW/kg (Calculated) : 70.7 Heparin Dosing Weight: 95 kg  Vital Signs: Temp: 97.7 F (36.5 C) (03/30 0800) Temp Source: Axillary (03/30 0800) BP: 159/46 (03/30 0400) Pulse Rate: 56 (03/30 0845)  Labs: Recent Labs    02/02/21 0118 02/02/21 0240 02/02/21 0311 02/02/21 1559 02/03/21 0320 02/03/21 1633 02/03/21 1929 02/04/21 0328  HGB 7.4*   < > 7.5*  --  7.9*  --  9.2* 8.0*  HCT 23.9*   < > 23.7*  --  25.6*  --  27.0* 27.1*  PLT 176  --  179  --  145*  --   --  125*  APTT  --    < > 76*  --  48* 64*  --  55*  HEPARINUNFRC  --   --  0.98*  --  0.83*  --   --  0.69  CREATININE 1.85*  --  1.78*   < > 1.43* 1.38*  --  1.41*  TROPONINIHS 141*  --  146*  --   --   --   --   --    < > = values in this interval not displayed.    Estimated Creatinine Clearance: 64 mL/min (A) (by C-G formula based on SCr of 1.41 mg/dL (H)).   Medical History: Past Medical History:  Diagnosis Date  . Anemia    labs today indicate (09/27/11)  . Arthritis    right knee  . Bleeding ulcer    hx of, required 6 units blood  . Blood transfusion    6 units with ulcer repair  surgery  . Chronic kidney disease    stage IV, has fistula, never started diaylsis  . Complication of anesthesia    little slow to wake up with last surgery  . Dyspnea    with exertion  . Gout   . Hip dislocation, left ([place]) 10/09/2020  . Hypertension   . Osteoporosis   . Pneumonia     Medications:  Infusions:  . sodium chloride 5 mL/hr at 02/04/21 0800  . DOPamine 5 mcg/kg/min (02/04/21 0911)  . feeding supplement (VITAL 1.5 CAL) 1,000 mL (02/03/21 2250)  . fentaNYL infusion INTRAVENOUS 50 mcg/hr (02/04/21 0800)  . heparin 1,900 Units/hr (02/04/21 0800)  . norepinephrine (LEVOPHED) Adult infusion 8 mcg/min (02/04/21 0800)  . prismasol B22GK 4/0 500 mL/hr at 02/04/21 0723  . prismasol B22GK 4/0 1,500 mL/hr at 02/04/21 0720  . prismasol B22GK 4/0 300 mL/hr at 02/04/21 0358  . vasopressin Stopped (02/03/21 1155)    Assessment: 60 yo male on chronic anticoagulation with apixaban, was held for pericardial window.  Pharmacy asked to resume IV heparin.  Last dose of apixaban was 3/24 AM.  aPTT 55, HL at 0.69 which is higher end of goal; since apixaban was last administered 3/24, will just follow heparin levels now.   No problems with gtt infusing per RN. Of note patient's platelets have been trending down to 125 today. 4T score low, unlikely related to heparin as patient is on CRRT but will continue monitoring PLT closely.   Goal of Therapy:  Heparin level 0.3 - 0.5 Monitor platelets by anticoagulation protocol: Yes  Plan:  Decrease IV heparin to 1,800 units/hr  Follow up with heparin level in ~8 hours  Monitor CBC, s/sx bleeding  Mercy Riding, PharmD PGY1 Acute Care Pharmacy Resident Please refer to [facility] for unit-specific pharmacist

## 2021-02-04 NOTE — Progress Notes (Signed)
EVENING ROUNDS NOTE :     [address]       [place] [address]             [phone]                 6 Days Post-Op Procedure(s) (LRB): PERICARDIAL WINDOW WITH ANTERIOR THORACOTOMY APPROACH (Left)   Total Length of Stay:  LOS: 14 days  Events:   No events Low CT output today    BP (!) 153/51   Pulse 60   Temp 97.7 F (36.5 C) (Axillary)   Resp (!) 22   Ht '5\' 9"'$  (1.753 m)   Wt 99.4 kg   SpO2 100%   BMI 32.36 kg/m      Vent Mode: PSV;CPAP FiO2 (%):  [40 %-50 %] 40 % Set Rate:  [22 bmp] 22 bmp Vt Set:  [560 mL] 560 mL PEEP:  [5 cmH20] 5 cmH20 Pressure Support:  [10 cmH20] 10 cmH20 Plateau Pressure:  [17 cmH20-19 cmH20] 18 cmH20  . sodium chloride 5 mL/hr at 02/04/21 1600  . DOPamine 10 mcg/kg/min (02/04/21 1600)  . feeding supplement (VITAL 1.5 CAL) 1,000 mL (02/03/21 2250)  . fentaNYL infusion INTRAVENOUS 150 mcg/hr (02/04/21 1615)  . heparin 1,900 Units/hr (02/04/21 1600)  . norepinephrine (LEVOPHED) Adult infusion Stopped (02/04/21 1054)  . prismasol B22GK 4/0 500 mL/hr at 02/04/21 0723  . prismasol B22GK 4/0 1,500 mL/hr at 02/04/21 1402  . prismasol B22GK 4/0 300 mL/hr at 02/04/21 0358  . vasopressin Stopped (02/03/21 1155)    I/O last 3 completed shifts: In: 4347.9 [I.V.:1757.9; NG/GT:2590] Out: [id number] [Other:5288; Chest Tube:310]   CBC Latest Ref Rng & Units 02/04/2021 02/03/2021 02/03/2021  WBC 4.0 - 10.5 K/uL 32.2(H) - 34.2(H)  Hemoglobin 13.0 - 17.0 g/dL 8.0(L) 9.2(L) 7.9(L)  Hematocrit 39.0 - 52.0 % 27.1(L) 27.0(L) 25.6(L)  Platelets 150 - 400 K/uL 125(L) - 145(L)    BMP Latest Ref Rng & Units 02/04/2021 02/03/2021 02/03/2021  Glucose 70 - 99 mg/dL 201(H) - 177(H)  BUN 8 - 23 mg/dL 32(H) - 31(H)  Creatinine 0.61 - 1.24 mg/dL 1.41(H) - 1.38(H)  BUN/Creat Ratio 10 - 24 - - -  Sodium 135 - 145 mmol/L 132(L) 136 134(L)  Potassium 3.5 - 5.1 mmol/L 4.4 4.3 4.0  Chloride 98 - 111 mmol/L 104 - 104  CO2 22 - 32 mmol/L 21(L) - 23   Calcium 8.9 - 10.3 mg/dL 11.9(H) - 11.7(H)    ABG    Component Value Date/Time   PHART 7.309 (L) 02/03/2021 1929   PCO2ART 47.7 02/03/2021 1929   PO2ART 195 (H) 02/03/2021 1929   HCO3 24.1 02/03/2021 1929   TCO2 26 02/03/2021 1929   ACIDBASEDEF 2.0 02/03/2021 1929   O2SAT 100.0 02/03/2021 1929       Melodie Bouillon, MD 02/04/2021 5:15 PM

## 2021-02-04 NOTE — Progress Notes (Signed)
Shift events: frequent sinus pauses requiring transcutaneous pacing, introducer with TVP placed at bedside. CRRT filter changed. SBT on CPAP.   N: opens eyes to command and to voice, follows commands moderately on all extremities., nodding appropriately to yes/no questions. Afebrile, warmed with prismaflex warmer. Sedated with fentanyl gtt, versed 31m IVP and 105m Fentanyl IVP given per order for procedure.   R: ETT, rochous/dim lungs, scant thick/tan secretions. CPAP mode 40% + 5 from 1000-1600, [facility]. No apnea or low TV alarms.   CV: Aflutter, rate 30-40. Frequent sinus pauses requiring transcutaneous pacing at 30bpm with 4067mMD Agarwala notified by charge RN of frequency of pacing and sinus pauses at 0850. Dopamine ordered and started with some decrease in pauses/pacing. MD Agarwala notified around 1100 of dopamine results though still requiring frequent transcutanous pacing, TV wires requested by RN for evidence of pain associated with transcutaneous pacing. MD at bedside around 1300 and RN notified of continued pacing requirements and pateint's evidence of pain, RN requested placement of TV wires. MD notified again at 1530 of continued requirement of transcutaneous pacing and patient's nonverbal endorsement of pain associated. RN leadership involved in escalating advocating for TV wire placement. MD placed introducer and TV wires at 1600, see my other note. BP MAP goal greater than 65 met, levophed weaned off, now only on dobutamine. Amio discontinued and midrodine discontinued for suspected amio toxicity and midrodine causing bradyarrythmias. Chest tube with no output. Heparin gtt remains on, no titrations.   GI: NGT infusing tube feeds at goal. No BM, stool regimen given. BG covered with SS insulin.   GU: No foley, oliguria. CRRT running, filter changed at 1800 for suspected clotting of filter soon, blood returned.   Brother and sister-in-law at bedside. No labs  of concern. Back and sacral skin not assessed r/t instability of HR (increase in sinus pauses when touched and small turns).   Mark Purnell RN

## 2021-02-04 NOTE — Progress Notes (Signed)
Dr. Lynetta Mare notified of patient requiring transcutaneous pacing since approximately 0700.  Orders received to start on Dopamine gtt and titrate for HR.  If this does not work he will coordinate for a transvenous pacing wire.

## 2021-02-04 NOTE — Progress Notes (Signed)
[facility] NEPHROLOGY PROGRESS NOTE  Assessment/ Plan: Pt is a 60 y.o. yo male  With PMH of HTN, DM, HLD, obesity, CKD stage V baseline creatinine level around 5 admitted with acute diastolic CHF noted when he presented for DCCV 01/11/2021.  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  -Continue with CRRT (Access: Right IJ temp line). Run even today.  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 Scot Dock that is mature, able to use for IHD.  Cardiac arrest, PEA 3/24; 3/28 another arrest = asystole noted when mucus plugged -ROSC 2mn. respiratory/hypoxia driven  Shock - multifactorial -pressor support and antibiotics per CCM, on midodrine as well; d/w RN wean NE as able, cont midodrine.  -Blood culture NGTD  Acute exacerbation of diastolic heart failure:  -Started CRRT for ultrafiltration. UFR see above  Pericardial effusion w/ tamponade -s/p pericardial window with thoracotomy 3/24   Anemia of chronic disease: Iron deficiency therefore ordered IV iron and ESA on 3/17.  Continue ESA qFriday.  Transfuse for hgb <7, hgb 7.9 today, per primary  Secondary hyperparathyroidism: phos controlled with rrt  Aflutter -cardiology following.  Was off a/c for surgery but now on heparin drip.  Having bradycardia and pauses overnight; midodrine held.   Hypercalemia:  Presumably immobility; corrects to nearly 14.  Switched to 0Ca dialysate on 3/30.  Per RPH no other ca sources.  Checking PTH and vit D 1,25  Subjective: I/Os yest 2.7 / 2.8.  Still on NE and vaso support.  Brady and pauses overnight; midodrine held; electrolytes ok  Objective Vital signs in last 24 hours: Vitals:   02/04/21 0600 02/04/21 0615 02/04/21 0630 02/04/21 0645  BP:       Pulse:      Resp: 19 16 18 18   Temp:      TempSrc:      SpO2:      Weight:      Height:       Weight change: -0.6 kg  Intake/Output Summary (Last 24 hours) at 02/04/2021 0655 Last data filed at 02/04/2021 0600 Gross per 24 hour  Intake 2826.15 ml  Output 3110 ml  Net -283.85 ml       Labs: Basic Metabolic Panel: Recent Labs  Lab 02/03/21 0320 02/03/21 1633 02/03/21 1929 02/04/21 0328  NA 134* 134* 136 132*  K 3.8 4.0 4.3 4.4  CL 100 104  --  104  CO2 26 23  --  21*  GLUCOSE 148* 177*  --  201*  BUN 31* 31*  --  32*  CREATININE 1.43* 1.38*  --  1.41*  CALCIUM 12.6* 11.7*  --  11.9*  PHOS 4.4 4.8*  --  5.4*   Liver Function Tests: Recent Labs  Lab 01/24/2021 0122 01/22/2021 0515 02/03/21 0320 02/03/21 1633 02/04/21 0328  AST 35  --   --   --   --   ALT 17  --   --   --   --   ALKPHOS 85  --   --   --   --   BILITOT 0.9  --   --   --   --   PROT 7.8  --   --   --   --  ALBUMIN 2.4*   < > 1.7* 1.8* 1.8*   < > = values in this interval not displayed.   No results for input(s): LIPASE, AMYLASE in the last 168 hours. No results for input(s): AMMONIA in the last 168 hours. CBC: Recent Labs  Lab 02/01/21 0355 02/01/21 0928 02/02/21 0118 02/02/21 0240 02/02/21 0311 02/03/21 0320 02/03/21 1929 02/04/21 0328  WBC 38.0*  --  37.6*  --  38.3* 34.2*  --  32.2*  HGB 6.9*   < > 7.4*   < > 7.5* 7.9* 9.2* 8.0*  HCT 22.6*   < > 23.9*   < > 23.7* 25.6* 27.0* 27.1*  MCV 103.7*  --  101.7*  --  102.2* 102.8*  --  105.9*  PLT 187  --  176  --  179 145*  --  125*   < > = values in this interval not displayed.   Cardiac Enzymes: No results for input(s): CKTOTAL, CKMB, CKMBINDEX, TROPONINI in the last 168 hours. CBG: Recent Labs  Lab 02/03/21 1159 02/03/21 1631 02/03/21 1947 02/03/21 2351 02/04/21 0327  GLUCAP 171* 162* 155* 143* 189*    Iron Studies:  No results for input(s): IRON, TIBC, TRANSFERRIN, FERRITIN in the last 72  hours. Studies/Results: ECHOCARDIOGRAM LIMITED  Result Date: 02/03/2021    ECHOCARDIOGRAM LIMITED REPORT   Patient Name:   Mark [name] Date of Exam: 02/03/2021 Medical Rec #:  [id number]        Height:       69.0 in Accession #:    [id number]       Weight:       220.5 lb Date of Birth:  [birth year]        BSA:          2.153 m Patient Age:    44 years         BP:           153/47 mmHg Patient Gender: M                HR:           45 bpm. Exam Location:  Inpatient Procedure: Limited Echo, Limited Color Doppler and Cardiac Doppler Indications:    Pericardial Effusion I31.3  History:        Patient has prior history of Echocardiogram examinations, most                 recent 02/04/2021. Signs/Symptoms:Dyspnea; Risk                 Factors:Hypertension.  Sonographer:    Mikki Santee RDCS (AE) Referring Phys: [id number] Kipp Brood  Sonographer Comments: Echo performed with patient supine and on artificial respirator. IMPRESSIONS  1. Compared with the echo 01/09/2021, pericardial effusion is much smaller and tamponade is no longer present.  2. Septal hypokinesis consistent with bundle branch block.  3. Left ventricular ejection fraction, by estimation, is 55 to 60%. The left ventricle has normal function.  4. A small pericardial effusion is present. Findings are consistent with cardiac tamponade.  5. There is moderately elevated pulmonary artery systolic pressure.  6. The inferior vena cava is dilated in size with <50% respiratory variability, suggesting right atrial pressure of 15 mmHg. FINDINGS  Left Ventricle: Left ventricular ejection fraction, by estimation, is 55 to 60%. The left ventricle has normal function. Right Ventricle: There is moderately elevated pulmonary artery systolic pressure. The tricuspid regurgitant velocity is 2.93 m/s, and with an assumed right atrial pressure  of 15 mmHg, the estimated right ventricular systolic pressure is 20.7 mmHg. Pericardium: A small pericardial effusion is present.  There is evidence of cardiac tamponade. Tricuspid Valve: Tricuspid valve regurgitation is trivial. Venous: The inferior vena cava is dilated in size with less than 50% respiratory variability, suggesting right atrial pressure of 15 mmHg. TRICUSPID VALVE TR Peak grad:   34.3 mmHg TR Vmax:        293.00 cm/s Skeet Latch MD Electronically signed by Skeet Latch MD Signature Date/Time: 02/03/2021/4:47:35 PM    Final     Medications: Infusions: . sodium chloride 5 mL/hr at 02/04/21 0600  . feeding supplement (VITAL 1.5 CAL) 1,000 mL (02/03/21 2250)  . fentaNYL infusion INTRAVENOUS 100 mcg/hr (02/04/21 0600)  . heparin 1,900 Units/hr (02/04/21 0600)  . norepinephrine (LEVOPHED) Adult infusion 11 mcg/min (02/04/21 0600)  . prismasol B22GK 4/0 500 mL/hr at 02/03/21 2105  . prismasol B22GK 4/0 1,500 mL/hr at 02/04/21 0358  . prismasol B22GK 4/0 300 mL/hr at 02/04/21 0358  . vasopressin Stopped (02/03/21 1155)    Scheduled Medications: . atropine      . B-complex with vitamin C  1 tablet Per Tube Daily  . chlorhexidine gluconate (MEDLINE KIT)  15 mL Mouth Rinse BID  . Chlorhexidine Gluconate Cloth  6 each Topical Q0600  . clindamycin   Topical BID  . darbepoetin (ARANESP) injection - DIALYSIS  150 mcg Intravenous Q Fri-HD  . diclofenac Sodium  2 g Topical QID  . docusate  100 mg Per Tube BID  . feeding supplement (PROSource TF)  45 mL Per Tube QID  . insulin aspart  1-3 Units Subcutaneous Q4H  . insulin aspart  4 Units Subcutaneous Q4H  . loratadine  10 mg Per Tube Daily  . mouth rinse  15 mL Mouth Rinse 10 times per day  . midodrine  5 mg Per Tube TID WC  . pantoprazole sodium  40 mg Per Tube Daily  . polyethylene glycol  17 g Per Tube Daily  . sodium chloride flush  10-40 mL Intracatheter Q12H    have reviewed scheduled and prn medications.  Physical Exam: General: ill appearing, intubated  Heart: s1s2, irreg irreg,  Lungs: diminished air entry bibasilar, intubated, chest tube  with minimal outpt Abdomen:soft, Non-tender, distended Extremities: trace Bilateral lower extremity edema Neuro: awake and alert Dialysis Access: LUE AV fistula, rij temp HD catheter  Ria Comment A Geanna Divirgilio 02/04/2021,6:55 AM  LOS: 14 days

## 2021-02-04 NOTE — Progress Notes (Signed)
eLink Physician-Brief Progress Note Patient Name: ROSARIO MIRKIN DOB: [birth year] MRN: [id number]   Date of Service  02/04/2021  HPI/Events of Note  On CRRT, IV heparin, TVpacer inserted today RN calling for acute change MS, pupils unequal ON camera - RT bigger than Lt , both reactive, does not follow commands, fent has been turned off, paced rhythm  eICU Interventions  Hold IV heparin Head CT stat - bedside team to eval D/w Rapid response RN & bedside RN     Intervention Category Major Interventions: Change in mental status - evaluation and management  Kalisa Girtman V. Hadja Harral 02/04/2021, 8:37 PM

## 2021-02-04 NOTE — Procedures (Signed)
Temporary Transvenous Pacemaker Insertion  Indication: bradycardia with pauses requiring frequent TC pacing  Procedure:  A 40F balloon floatation temporary wire was advanced via a left IJ introducer until capture was achieved. Wire was then manipulated to achieve best threshold.   CXR confirms position of the wire in the apex of the right ventricle.  Kipp Brood, MD [facility] ICU Physician [place]  Pager: [phone] Or Epic Secure Chat After hours: [phone].  02/04/2021, 4:48 PM

## 2021-02-04 NOTE — Progress Notes (Signed)
Per discussion with pharmacist- please hold AM dose of midodrine until discussion with rounding MD.

## 2021-02-04 NOTE — Progress Notes (Signed)
NAME:  Mark [name], MRN:  [id number], DOB:  January 01, 1961, LOS: 54 ADMISSION DATE:  01/23/2021, CONSULTATION DATE: 3/19 REFERRING MD:  Dr. Erlinda Hong, CHIEF COMPLAINT:  TRH    History of Present Illness:  60 y/o M who presented to [facility] on 3/16 for elective cardioversion but presented with shortness of breath, leg swelling and palpitations.   The patient has known CKD IV with baseline sr cr of 5, conservative management, L arm AVF in place in anticipation of HD.  On presentation, he was found to have significant volume overload needing admission per TRH.  After cardioversion he became transiently hypotensive and briefly required neosynephrine gtt.  He was admitted and Nephrology was consulted. Hospital course notable for Proteus UTI, anemia, intolerance to initial HD (chest pain, SVT) requiring brief CVVHD and BIPAP use.  In the ICU he continued to require bipap. He was transitioned to [facility] and had one session on 3/23 with 3L removed / -750 for day.  In the early am of 3/24 the patient pulled his bipap mask off and suffered a respiratory arrest requiring intubation and one round of CPR.   Pertinent  Medical History  CKD IV - HD started this admit (02/05/2021) HTN HLD Chronic Diastolic CHF  Moderate AS Atrial Flutter - on Eliquis  Obesity  Anemia of Chronic Disease   Significant Hospital Events: Including procedures, antibiotic start and stop dates in addition to other pertinent events    3/16 Admit after cardioversion, SOB, leg swelling / palpitations prior to presentation   3/19 PCCM consulted   3/20 CRRT started  3/21 Improved mental status, wore BiPAP overnight. Staph epidermis growing from [facility]. UC growing proteus, abx changed from ceftriaxone to cefepime.  Fever 100.2, -3.6L in last 24 hours.    3/21 CVVHD stopped.  3/23 Tolerated iHD  3/24 Respiratory arrest, PEA, (pulled bipap off), intubated, re-cultured, ECHO tamponade, Pericardial window, Pericardial drain 28Fr by Julien Girt  3/25  palliative care consulted   3/27 CVVHD, pressors levo + vaso   CT chest showed no clear intraabdominal abscess.  3/27 brief overnight arrest when bit down on ETT  3/28 ECHO shows no significant residual effusion   3/29 bradycardia required TCP, amiodarone and midodrine stopped.   Interim History / Subjective:   Severe bradycardia overnight, required TCP. HR has improved to 50-80 with dopamine. Amiodarone and midodrine stopped. Now off vasopressin and weaning NE down.  Objective   Blood pressure (!) 159/46, pulse (!) 56, temperature 97.7 F (36.5 C), temperature source Axillary, resp. rate 19, height '5\' 9"'$  (1.753 m), weight 99.4 kg, SpO2 100 %.    Vent Mode: PRVC FiO2 (%):  [40 %-50 %] 40 % Set Rate:  [22 bmp] 22 bmp Vt Set:  [560 mL] 560 mL PEEP:  [5 cmH20] 5 cmH20 Pressure Support:  [10 cmH20] 10 cmH20 Plateau Pressure:  [17 cmH20-19 cmH20] 18 cmH20 IPAP 20 EPAP 8  Intake/Output Summary (Last 24 hours) at 02/04/2021 1024 Last data filed at 02/04/2021 0800 Gross per 24 hour  Intake 2426.39 ml  Output 2401 ml  Net 25.39 ml   Filed Weights   02/02/21 0500 02/03/21 0355 02/04/21 0342  Weight: 101.7 kg 100 kg 99.4 kg    Examination: General: critically ill, intubated   HEENT: ETT in place,   Neuro: sedated on mech vent, opens eyes to voice, will follow commands weakly.   CV: Irregularly, irregular, s1 s2 normal  PULM: normal vesicular breath sounds bilaterally. Tolerating PSV GI: soft nt nd  Extremities: edema resolved.   Skin: no rash  VVS: left AV fistula   Laboratory investigations reviewed.   WBC: 32 and improving  Resolved Hospital Problem list     Assessment & Plan:   PEA Arrest suspect respiratory driven as pulled bipap off + tamponade Cardiac Tamponade due to uremia.  Acute Hypercarbic Respiratory Failure in setting of Acute Diastolic CHF CKD V / New ESRD  Acute Exacerbation of dCHF Atrial Fibrillation / Flutter  - now bradycardic Chronic  Hypotension Acute on Chronic Diastolic HF Moderate Aortic Stenosis  Macrocytic Anemia  BPH Septic Shock  Proteus UTI Chronic Hydradenitis / L Groin Wound, Small Wound on Right Thigh Leukocytosis -- stable, no abscess on CT Left Shoulder Pain  Moderate Protein Calorie Malnutrition GOC:  Plan:  - Goal at this point this to get the patient to tolerate iHD. LV function is normal, so should be able tolerate now that tamponade resolved. - Appears to be euvolemic at this point - continue to run CRRT even and wean NE off if possible - Continue to support HR with Dopamine and allow effects of midodrine and amiodarone to wear off.  Keep TCP on standby - Continue to observe leukocytosis and has completed course of antibiotics and no abscess seen on CT. - Wean fentanyl off - transition to enteral oxycodone taper. Allow to wake up and extubate.    Best practice (evaluated daily)  Diet:  TF Pain/Anxiety/Delirium protocol (if indicated): Yes (RASS goal -1) on fentanyl VAP protocol (if indicated): Not indicated DVT prophylaxis: Systemic AC GI prophylaxis: PPI Glucose control:  Basal insulin No Central venous access:  Yes, and it is still needed Arterial line:  N/A Foley:  N/A Mobility:  bed rest  PT consulted: N/A Last date of multidisciplinary goals of care discussion: Palliative care meeting 01/31/2021.  I spoke to patient's brother 3/29 at told him that ultimate goal is to get patient to tolerate intermittent dialysis. If this is not possible then the patient would be in an ultimately non-survivable situation.  Code Status:  full code Disposition: ICU   CRITICAL CARE Performed by: Kipp Brood   Total critical care time: 40 minutes  Critical care time was exclusive of separately billable procedures and treating other patients.  Critical care was necessary to treat or prevent imminent or life-threatening deterioration.  Critical care was time spent personally by me on the following  activities: development of treatment plan with patient and/or surrogate as well as nursing, discussions with consultants, evaluation of patient's response to treatment, examination of patient, obtaining history from patient or surrogate, ordering and performing treatments and interventions, ordering and review of laboratory studies, ordering and review of radiographic studies, pulse oximetry, re-evaluation of patient's condition and participation in multidisciplinary rounds.  Kipp Brood, MD [facility] ICU Physician [place]  Pager: [phone] Mobile: [phone] After hours: [phone].   02/04/2021 10:24 AM

## 2021-02-04 NOTE — Progress Notes (Signed)
RT note. Pt. Transported to CT & back without any complications.

## 2021-02-04 NOTE — Significant Event (Signed)
Rapid Response Event Note   Reason for Call :  Unequal pupils  Initial Focused Assessment:  Pt lying in bed in no visible distress. He is on a ventilator, CRRT, and is being TV paced. He has been on fentanyl for sedation. This was turned off at 2021 to help with neuro assessment when unequal pupils were found during nurse's assessment. Pupils are unequal and react briskly. R-5, L 3. Pt has an upward gaze. Pt will intermittently blink to threat from both sides. Pt is profoundly weak making it difficult to assess for unilateral weakness/focal deficits. He will not follow commands. He appears to move all extremities weakly to pain.    T-97.9, HR-58, BP-125/44, RR-22 on .40 ventiltor.   Interventions:  CBG-115 Heparin paused CT head STAT Bedside team to come assess pt Plan of Care:  CT head negative for acute changes. No focal deficits found at this time. Continue to monitor. Call RRT if further assistance needed.    Event Summary:   MD Notified: Dr. Elsworth Soho notified prior to my arrival and assessed pt over [place] Call Time:2028 Arrival Time:2030 End Time: 2130  Dillard Essex, RN

## 2021-02-04 NOTE — Procedures (Signed)
Central Venous Catheter Insertion Procedure Note  ASANTE BURGY  [id number]  08-08-1961  Date:02/04/21  Time:4:36 PM   Provider Performing:Kelina Beauchamp   Procedure: Insertion of Non-tunneled Central Venous [phone] with US guidance [id number]   Indication(s) Transvenous pacemaker insertion  Consent Risks of the procedure as well as the alternatives and risks of each were explained to the patient and/or caregiver.  Consent for the procedure was obtained and is signed in the bedside chart  Anesthesia Topical only with 1% lidocaine   Timeout Verified patient identification, verified procedure, site/side was marked, verified correct patient position, special equipment/implants available, medications/allergies/relevant history reviewed, required imaging and test results available.  Sterile Technique Maximal sterile technique including full sterile barrier drape, hand hygiene, sterile gown, sterile gloves, mask, hair covering, sterile ultrasound probe cover (if used).  Procedure Description Area of catheter insertion was cleaned with chlorhexidine and draped in sterile fashion.  With real-time ultrasound guidance a 97F introducer sheath was placed into the left internal jugular vein. Nonpulsatile blood flow and easy flushing noted in all ports.  The catheter was sutured in place and sterile dressing applied.     Complications/Tolerance None; patient tolerated the procedure well. Chest X-ray is ordered to verify placement for internal jugular or subclavian cannulation.   Chest x-ray is not ordered for femoral cannulation.  EBL Minimal  Kipp Brood, MD [facility] ICU Physician [place]  Pager: [phone] Or Epic Secure Chat After hours: [phone].  02/04/2021, 4:38 PM

## 2021-02-05 ENCOUNTER — Inpatient Hospital Stay (HOSPITAL_COMMUNITY): Payer: 59

## 2021-02-05 DIAGNOSIS — R0781 Pleurodynia: Secondary | ICD-10-CM | POA: Diagnosis not present

## 2021-02-05 LAB — PTH, INTACT AND CALCIUM
Calcium, Total (PTH): 10.9 mg/dL — ABNORMAL HIGH (ref 8.6–10.2)
PTH: 239 pg/mL — ABNORMAL HIGH (ref 15–65)

## 2021-02-05 LAB — CBC WITH DIFFERENTIAL/PLATELET
Abs Immature Granulocytes: 1.13 10*3/uL — ABNORMAL HIGH (ref 0.00–0.07)
Basophils Absolute: 0.1 10*3/uL (ref 0.0–0.1)
Basophils Relative: 0 %
Eosinophils Absolute: 0 10*3/uL (ref 0.0–0.5)
Eosinophils Relative: 0 %
HCT: 28.2 % — ABNORMAL LOW (ref 39.0–52.0)
Hemoglobin: 8.5 g/dL — ABNORMAL LOW (ref 13.0–17.0)
Immature Granulocytes: 3 %
Lymphocytes Relative: 3 %
Lymphs Abs: 1 10*3/uL (ref 0.7–4.0)
MCH: 32 pg (ref 26.0–34.0)
MCHC: 30.1 g/dL (ref 30.0–36.0)
MCV: 106 fL — ABNORMAL HIGH (ref 80.0–100.0)
Monocytes Absolute: 1.5 10*3/uL — ABNORMAL HIGH (ref 0.1–1.0)
Monocytes Relative: 4 %
Neutro Abs: 34.5 10*3/uL — ABNORMAL HIGH (ref 1.7–7.7)
Neutrophils Relative %: 90 %
Platelets: 96 10*3/uL — ABNORMAL LOW (ref 150–400)
RBC: 2.66 MIL/uL — ABNORMAL LOW (ref 4.22–5.81)
RDW: 18.6 % — ABNORMAL HIGH (ref 11.5–15.5)
WBC: 38.3 10*3/uL — ABNORMAL HIGH (ref 4.0–10.5)
nRBC: 4.3 % — ABNORMAL HIGH (ref 0.0–0.2)

## 2021-02-05 LAB — RENAL FUNCTION PANEL
Albumin: 1.6 g/dL — ABNORMAL LOW (ref 3.5–5.0)
Albumin: 1.5 g/dL — ABNORMAL LOW (ref 3.5–5.0)
Anion gap: 7 (ref 5–15)
Anion gap: 8 (ref 5–15)
BUN: 38 mg/dL — ABNORMAL HIGH (ref 8–23)
BUN: 36 mg/dL — ABNORMAL HIGH (ref 8–23)
CO2: 19 mmol/L — ABNORMAL LOW (ref 22–32)
CO2: 17 mmol/L — ABNORMAL LOW (ref 22–32)
Calcium: 13.3 mg/dL (ref 8.9–10.3)
Calcium: 13.3 mg/dL (ref 8.9–10.3)
Chloride: 107 mmol/L (ref 98–111)
Chloride: 107 mmol/L (ref 98–111)
Creatinine, Ser: 1.41 mg/dL — ABNORMAL HIGH (ref 0.61–1.24)
Creatinine, Ser: 1.25 mg/dL — ABNORMAL HIGH (ref 0.61–1.24)
GFR, Estimated: 57 mL/min — ABNORMAL LOW (ref >60)
GFR, Estimated: >60 mL/min (ref >60)
Glucose, Bld: 152 mg/dL — ABNORMAL HIGH (ref 70–99)
Glucose, Bld: 123 mg/dL — ABNORMAL HIGH (ref 70–99)
Phosphorus: 6.8 mg/dL — ABNORMAL HIGH (ref 2.5–4.6)
Phosphorus: 7.4 mg/dL — ABNORMAL HIGH (ref 2.5–4.6)
Potassium: 4.9 mmol/L (ref 3.5–5.1)
Potassium: 5.1 mmol/L (ref 3.5–5.1)
Sodium: 133 mmol/L — ABNORMAL LOW (ref 135–145)
Sodium: 132 mmol/L — ABNORMAL LOW (ref 135–145)

## 2021-02-05 LAB — POCT I-STAT 7, (LYTES, BLD GAS, ICA,H+H)
Acid-base deficit: 7 mmol/L — ABNORMAL HIGH (ref 0.0–2.0)
Acid-base deficit: 17 mmol/L — ABNORMAL HIGH (ref 0.0–2.0)
Bicarbonate: 19.4 mmol/L — ABNORMAL LOW (ref 20.0–28.0)
Bicarbonate: 11.5 mmol/L — ABNORMAL LOW (ref 20.0–28.0)
Calcium, Ion: 1.93 mmol/L (ref 1.15–1.40)
Calcium, Ion: 1.98 mmol/L (ref 1.15–1.40)
HCT: 28 % — ABNORMAL LOW (ref 39.0–52.0)
HCT: 30 % — ABNORMAL LOW (ref 39.0–52.0)
Hemoglobin: 9.5 g/dL — ABNORMAL LOW (ref 13.0–17.0)
Hemoglobin: 10.2 g/dL — ABNORMAL LOW (ref 13.0–17.0)
O2 Saturation: 98 %
O2 Saturation: 98 %
Patient temperature: 98.4
Patient temperature: 97.6
Potassium: 5.1 mmol/L (ref 3.5–5.1)
Potassium: 5.5 mmol/L — ABNORMAL HIGH (ref 3.5–5.1)
Sodium: 135 mmol/L (ref 135–145)
Sodium: 135 mmol/L (ref 135–145)
TCO2: 21 mmol/L — ABNORMAL LOW (ref 22–32)
TCO2: 13 mmol/L — ABNORMAL LOW (ref 22–32)
pCO2 arterial: 42.3 mmHg (ref 32.0–48.0)
pCO2 arterial: 36.4 mmHg (ref 32.0–48.0)
pH, Arterial: 7.269 — ABNORMAL LOW (ref 7.350–7.450)
pH, Arterial: 7.103 — CL (ref 7.350–7.450)
pO2, Arterial: 126 mmHg — ABNORMAL HIGH (ref 83.0–108.0)
pO2, Arterial: 137 mmHg — ABNORMAL HIGH (ref 83.0–108.0)

## 2021-02-05 LAB — MAGNESIUM: Magnesium: 2.8 mg/dL — ABNORMAL HIGH (ref 1.7–2.4)

## 2021-02-05 LAB — CBC
HCT: 28.1 % — ABNORMAL LOW (ref 39.0–52.0)
Hemoglobin: 8.3 g/dL — ABNORMAL LOW (ref 13.0–17.0)
MCH: 30.9 pg (ref 26.0–34.0)
MCHC: 29.5 g/dL — ABNORMAL LOW (ref 30.0–36.0)
MCV: 104.5 fL — ABNORMAL HIGH (ref 80.0–100.0)
Platelets: UNDETERMINED 10*3/uL (ref 150–400)
RBC: 2.69 MIL/uL — ABNORMAL LOW (ref 4.22–5.81)
RDW: 18.5 % — ABNORMAL HIGH (ref 11.5–15.5)
WBC: 39.9 10*3/uL — ABNORMAL HIGH (ref 4.0–10.5)
nRBC: 4.7 % — ABNORMAL HIGH (ref 0.0–0.2)

## 2021-02-05 LAB — CALCITRIOL (1,25 DI-OH VIT D): Vit D, 1,25-Dihydroxy: 49 pg/mL (ref 19.9–79.3)

## 2021-02-05 LAB — GLUCOSE, CAPILLARY
Glucose-Capillary: 120 mg/dL — ABNORMAL HIGH (ref 70–99)
Glucose-Capillary: 146 mg/dL — ABNORMAL HIGH (ref 70–99)
Glucose-Capillary: 161 mg/dL — ABNORMAL HIGH (ref 70–99)
Glucose-Capillary: 161 mg/dL — ABNORMAL HIGH (ref 70–99)
Glucose-Capillary: 165 mg/dL — ABNORMAL HIGH (ref 70–99)
Glucose-Capillary: 76 mg/dL (ref 70–99)

## 2021-02-05 LAB — HEPARIN LEVEL (UNFRACTIONATED): Heparin Unfractionated: 0.62 IU/mL (ref 0.30–0.70)

## 2021-02-05 LAB — APTT
aPTT: 64 seconds — ABNORMAL HIGH (ref 24–36)
aPTT: 66 seconds — ABNORMAL HIGH (ref 24–36)

## 2021-02-05 LAB — TRIGLYCERIDES: Triglycerides: 26 mg/dL (ref <150)

## 2021-02-05 MED ORDER — SODIUM CHLORIDE 0.9 % IV SOLN
200.0000 mg | Freq: Once | INTRAVENOUS | Status: AC
  Administered 2021-02-05: 200 mg via INTRAVENOUS
  Filled 2021-02-05: qty 200

## 2021-02-05 MED ORDER — PRISMASOL B22GK 4/0 22-4 MEQ/L REPLACEMENT SOLN
Status: DC
  Filled 2021-02-05 (×3): qty 5000

## 2021-02-05 MED ORDER — "THROMBI-PAD 3""X3"" EX PADS"
1.0000 | MEDICATED_PAD | Freq: Once | CUTANEOUS | Status: AC
  Administered 2021-02-05: 1 via TOPICAL
  Filled 2021-02-05: qty 1

## 2021-02-05 MED ORDER — ARTIFICIAL TEARS OPHTHALMIC OINT
TOPICAL_OINTMENT | Freq: Three times a day (TID) | OPHTHALMIC | Status: DC
  Filled 2021-02-05: qty 3.5

## 2021-02-05 MED ORDER — GLYCERIN (LAXATIVE) 2 G RE SUPP
1.0000 | Freq: Once | RECTAL | Status: AC
  Administered 2021-02-05: 1 via RECTAL
  Filled 2021-02-05 (×2): qty 1

## 2021-02-05 MED ORDER — SODIUM BICARBONATE 8.4 % IV SOLN
50.0000 meq | Freq: Once | INTRAVENOUS | Status: AC
  Administered 2021-02-05: 50 meq via INTRAVENOUS

## 2021-02-05 MED ORDER — SENNOSIDES 8.8 MG/5ML PO SYRP
10.0000 mL | ORAL_SOLUTION | Freq: Two times a day (BID) | ORAL | Status: DC
  Administered 2021-02-05 (×2): 10 mL
  Filled 2021-02-05 (×2): qty 10

## 2021-02-05 MED ORDER — SODIUM CHLORIDE 0.9 % IV SOLN
100.0000 mg | INTRAVENOUS | Status: DC
  Filled 2021-02-05: qty 100

## 2021-02-05 MED ORDER — SODIUM CHLORIDE 0.9 % IV SOLN
2.0000 g | Freq: Two times a day (BID) | INTRAVENOUS | Status: DC
  Administered 2021-02-05 (×2): 2 g via INTRAVENOUS
  Filled 2021-02-05 (×2): qty 2

## 2021-02-05 NOTE — Progress Notes (Signed)
NAME:  Mark [name], MRN:  [id number], DOB:  Dec 24, 1960, LOS: 35 ADMISSION DATE:  02/01/2021, CONSULTATION DATE: 3/19 REFERRING MD:  Dr. Erlinda Hong, CHIEF COMPLAINT:  TRH    History of Present Illness:  60 y/o M who presented to [facility] on 3/16 for elective cardioversion but presented with shortness of breath, leg swelling and palpitations.   The patient has known CKD IV with baseline sr cr of 5, conservative management, L arm AVF in place in anticipation of HD.  On presentation, he was found to have significant volume overload needing admission per TRH.  After cardioversion he became transiently hypotensive and briefly required neosynephrine gtt.  He was admitted and Nephrology was consulted. Hospital course notable for Proteus UTI, anemia, intolerance to initial HD (chest pain, SVT) requiring brief CVVHD and BIPAP use.  In the ICU he continued to require bipap. He was transitioned to [facility] and had one session on 3/23 with 3L removed / -750 for day.  In the early am of 3/24 the patient pulled his bipap mask off and suffered a respiratory arrest requiring intubation and one round of CPR.   Pertinent  Medical History  CKD IV - HD started this admit (02/01/2021) HTN HLD Chronic Diastolic CHF  Moderate AS Atrial Flutter - on Eliquis  Obesity  Anemia of Chronic Disease   Significant Hospital Events: Including procedures, antibiotic start and stop dates in addition to other pertinent events    3/16 Admit after cardioversion, SOB, leg swelling / palpitations prior to presentation   3/19 PCCM consulted   3/20 CRRT started  3/21 Improved mental status, wore BiPAP overnight. Staph epidermis growing from [facility]. UC growing proteus, abx changed from ceftriaxone to cefepime.  Fever 100.2, -3.6L in last 24 hours.    3/21 CVVHD stopped.  3/23 Tolerated iHD  3/24 Respiratory arrest, PEA, (pulled bipap off), intubated, re-cultured, ECHO tamponade, Pericardial window, Pericardial drain 28Fr by Julien Girt  3/25  palliative care consulted   3/27 CVVHD, pressors levo + vaso   CT chest showed no clear intraabdominal abscess.  3/27 brief overnight arrest when bit down on ETT  3/28 ECHO shows no significant residual effusion   3/29 bradycardia required TCP, amiodarone and midodrine stopped.   Interim History / Subjective:   TVPM placed yesterday for severe bradycardia in spite of dopamine, frequent TCP.  Unequal pupil last night but CT negative.   Objective   Blood pressure (!) 101/52, pulse 60, temperature 98.3 F (36.8 C), temperature source Oral, resp. rate (!) 21, height '5\' 9"'$  (1.753 m), weight 99.6 kg, SpO2 100 %.    Vent Mode: PSV;CPAP FiO2 (%):  [40 %] 40 % Set Rate:  [22 bmp] 22 bmp Vt Set:  [560 mL] 560 mL PEEP:  [5 cmH20] 5 cmH20 Pressure Support:  [10 cmH20] 10 cmH20 Plateau Pressure:  [15 cmH20-18 cmH20] 15 cmH20 IPAP 20 EPAP 8  Intake/Output Summary (Last 24 hours) at 02/05/2021 0907 Last data filed at 02/05/2021 0800 Gross per 24 hour  Intake 2564.71 ml  Output 2493 ml  Net 71.71 ml   Filed Weights   02/03/21 0355 02/04/21 0342 02/05/21 0400  Weight: 100 kg 99.4 kg 99.6 kg    Examination: General: critically ill, intubated   HEENT: ETT in place   Neuro: iv sedation off on mech vent, opens eyes to voice, will follow commands weakly.   CV: Irregularly, irregular, s1 s2 normal  PULM: normal vesicular breath sounds bilaterally. Tolerating PSV GI: soft nt nd  Extremities:  edema resolved.   Skin: no rash - line sites are intact.  VVS: left AV fistula   Laboratory investigations reviewed.   WBC: 32 and improving  Resolved Hospital Problem list     Assessment & Plan:   PEA Arrest suspect respiratory driven as pulled bipap off + tamponade Cardiac Tamponade due to uremia.  Acute Hypercarbic Respiratory Failure in setting of Acute Diastolic CHF CKD V / New ESRD  Acute Exacerbation of dCHF Atrial Fibrillation / Flutter  - now bradycardic Chronic Hypotension Acute  on Chronic Diastolic HF Moderate Aortic Stenosis  Macrocytic Anemia  BPH Septic Shock  Proteus UTI Chronic Hydradenitis / L Groin Wound, Small Wound on Right Thigh Leukocytosis -- stable, no abscess on CT Left Shoulder Pain  Moderate Protein Calorie Malnutrition GOC:  Plan:  - Goal at this point this to get the patient to tolerate iHD. LV function is normal, so should be able tolerate now that tamponade resolved. - Appears to be euvolemic at this point - continue to run CRRT even and wean NE off if possible - Continue to support HR with Dopamine and allow effects of midodrine and amiodarone to wear off.  TVPM at 65  - Will restart antibiotics including fungal coverage - source remains unclear.  - Wean fentanyl off - transition to enteral oxycodone taper. Allow to wake up and extubate.    Best practice (evaluated daily)  Diet:  TF Pain/Anxiety/Delirium protocol (if indicated): Yes (RASS goal -1) on fentanyl VAP protocol (if indicated): Not indicated DVT prophylaxis: Systemic AC GI prophylaxis: PPI - increased bowel regimen Glucose control:  Basal insulin No Central venous access:  Yes, and it is still needed - will resite groin lines.  Arterial line:  N/A Foley:  N/A Mobility:  bed rest  PT consulted: N/A Last date of multidisciplinary goals of care discussion: Palliative care meeting 01/31/2021.  I spoke to patient's brother 3/31 at told him that ultimate goal is to get patient to tolerate intermittent dialysis. If this is not possible then the patient would be in an ultimately non-survivable situation.  Code Status:  full code Disposition: ICU   CRITICAL CARE Performed by: Kipp Brood   Total critical care time: 40 minutes  Critical care time was exclusive of separately billable procedures and treating other patients.  Critical care was necessary to treat or prevent imminent or life-threatening deterioration.  Critical care was time spent personally by me on the  following activities: development of treatment plan with patient and/or surrogate as well as nursing, discussions with consultants, evaluation of patient's response to treatment, examination of patient, obtaining history from patient or surrogate, ordering and performing treatments and interventions, ordering and review of laboratory studies, ordering and review of radiographic studies, pulse oximetry, re-evaluation of patient's condition and participation in multidisciplinary rounds.  Kipp Brood, MD [facility] ICU Physician [place]  Pager: [phone] Mobile: [phone] After hours: [phone].   02/05/2021 9:07 AM

## 2021-02-05 NOTE — Progress Notes (Signed)
This chaplain is present for F/U spiritual care with the Pt. brother-Mark [name] after an update with Pt. RN-Andie.  The chaplain listens to Mark [name]'s story of family and how the brothers have become closer in the last 22 yrs.  Mark [name] openly reflects on how their mother's faith in God influenced their belief and is now a source of strength on the Pt. journey.   The chaplain understands brothers are planning to visit. The chaplain is available for F/U spiritual care as needed.

## 2021-02-05 NOTE — Progress Notes (Signed)
[facility] NEPHROLOGY PROGRESS NOTE  Assessment/ Plan: Pt is a 60 y.o. yo male  With PMH of HTN, DM, HLD, obesity, CKD stage V baseline creatinine level around 5 admitted with acute diastolic CHF noted when he presented for DCCV 01/17/2021.  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  -Continue with CRRT (Access: Right IJ temp line). Run even today.  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 Scot Dock that is mature, able to use for IHD.  Cardiac arrest, PEA 3/24; 3/28 another arrest = asystole noted when mucus plugged -ROSC 67mn. respiratory/hypoxia driven  Shock - multifactorial -pressor support and antibiotics per CCM; d/w RN wean NE as able -broad coverage was added back today 3/31  Acute exacerbation of diastolic heart failure:  -Started CRRT for ultrafiltration. UFR see above  Pericardial effusion w/ tamponade -s/p pericardial window with thoracotomy 3/24   Anemia of chronic disease: Iron deficiency therefore ordered IV iron and ESA on 3/17.  Continue ESA qFriday.  Transfuse for hgb <7, hgb 8.5 today, per primary  Secondary hyperparathyroidism: phos controlled with rrt  Aflutter -cardiology following.  Was off a/c for surgery but now on heparin drip.  Having bradycardia and pauses overnight; midodrine held and now with TV pacer.    Hypercalemia:  Presumably immobility; corrects to ~14.  Switched to 0Ca dialysate on 3/30 and ^ flow rates today.  Per RPH no other ca sources.  Checking PTH and vit D 1,25 but these have been unrevealing previously; PTHrP also unrevealing previously, will not repeat.   Subjective: I/Os yest 2.7 / 2.5.  Still on NE and vaso support.  Brady and pauses - now with TV pacer in place after needing  TCP Brother (2) bedside this AM  Objective Vital signs in last 24 hours: Vitals:   02/05/21 0900 02/05/21 1000 02/05/21 1100 02/05/21 1134  BP:    (!) 136/41  Pulse: (!) 57 (!) 50 (!) 55 (!) 57  Resp: (!) 23 17 (!) 21 (!) 27  Temp:      TempSrc:      SpO2: 100% 99% 95% 100%  Weight:      Height:       Weight change: 0.2 kg  Intake/Output Summary (Last 24 hours) at 02/05/2021 1202 Last data filed at 02/05/2021 1200 Gross per 24 hour  Intake 3083.61 ml  Output 2755 ml  Net 328.61 ml       Labs: Basic Metabolic Panel: Recent Labs  Lab 02/04/21 0328 02/04/21 1600 02/05/21 0337 02/05/21 0511  NA 132* 133* 133* 135  K 4.4 4.8 4.9 5.1  CL 104 108 107  --   CO2 21* 19* 19*  --   GLUCOSE 201* 133* 152*  --   BUN 32* 34* 38*  --   CREATININE 1.41* 1.42* 1.41*  --   CALCIUM 11.9*  10.9* 13.0* 13.3*  --   PHOS 5.4* 6.4* 6.8*  --    Liver Function Tests: Recent Labs  Lab 02/04/21 0328 02/04/21 1600 02/05/21 0337  ALBUMIN 1.8* 1.7* 1.6*   No results for input(s): LIPASE, AMYLASE in the last 168 hours. No results for input(s): AMMONIA in the last 168 hours. CBC: Recent Labs  Lab 02/02/21 0311 02/03/21 0320 02/03/21 1929  02/04/21 0328 02/05/21 0337 02/05/21 0511 02/05/21 0824  WBC 38.3* 34.2*  --  32.2* 39.9*  --  38.3*  NEUTROABS  --   --   --   --   --   --  34.5*  HGB 7.5* 7.9*   < > 8.0* 8.3* 9.5* 8.5*  HCT 23.7* 25.6*   < > 27.1* 28.1* 28.0* 28.2*  MCV 102.2* 102.8*  --  105.9* 104.5*  --  106.0*  PLT 179 145*  --  125* PLATELET CLUMPS NOTED ON SMEAR, UNABLE TO ESTIMATE  --  96*   < > = values in this interval not displayed.   Cardiac Enzymes: No results for input(s): CKTOTAL, CKMB, CKMBINDEX, TROPONINI in the last 168 hours. CBG: Recent Labs  Lab 02/04/21 1927 02/04/21 2333 02/05/21 0353 02/05/21 0825 02/05/21 1116  GLUCAP 115* 136* 146* 165* 161*    Iron Studies:  No results for input(s): IRON, TIBC, TRANSFERRIN, FERRITIN in the last 72  hours. Studies/Results: CT HEAD WO CONTRAST  Result Date: 02/04/2021 CLINICAL DATA:  Acute neurologic deficit EXAM: CT HEAD WITHOUT CONTRAST TECHNIQUE: Contiguous axial images were obtained from the base of the skull through the vertex without intravenous contrast. COMPARISON:  Head CT 10/08/2020 FINDINGS: Brain: There is no mass, hemorrhage or extra-axial collection. The size and configuration of the ventricles and extra-axial CSF spaces are normal. There is hypoattenuation of the white matter, most commonly indicating chronic small vessel disease. Vascular: No abnormal hyperdensity of the major intracranial arteries or dural venous sinuses. No intracranial atherosclerosis. Skull: The visualized skull base, calvarium and extracranial soft tissues are normal. Sinuses/Orbits: No fluid levels or advanced mucosal thickening of the visualized paranasal sinuses. No mastoid or middle ear effusion. The orbits are normal. IMPRESSION: Chronic small vessel disease without acute intracranial abnormality. Electronically Signed   By: Ulyses Jarred M.D.   On: 02/04/2021 21:12   DG CHEST PORT 1 VIEW  Result Date: 02/05/2021 CLINICAL DATA:  Atrial ventricular heart block. EXAM: PORTABLE CHEST 1 VIEW COMPARISON:  February 04, 2021. FINDINGS: Stable cardiomegaly. Endotracheal and feeding tubes are unchanged in position. Bilateral internal jugular catheters are unchanged. Left-sided chest tube is noted without pneumothorax. Right lung is clear. Mild left basilar atelectasis is noted. Bony thorax is unremarkable. IMPRESSION: Stable support apparatus. Left-sided chest tube is unchanged without evidence of pneumothorax. Mild left basilar atelectasis is noted. Electronically Signed   By: Marijo Conception M.D.   On: 02/05/2021 11:36   DG CHEST PORT 1 VIEW  Result Date: 02/04/2021 CLINICAL DATA:  Central line placement, transvenous pacer EXAM: PORTABLE CHEST 1 VIEW COMPARISON:  02/02/2021 FINDINGS: Single frontal view of the chest  demonstrates endotracheal tube overlying tracheal air column tip midway between thoracic inlet and carina. Enteric catheter passes below diaphragm tip excluded by collimation. Left chest tube again noted unchanged. External defibrillator pads overlie the cardiac silhouette. There is a right internal jugular catheter tip overlying superior vena cava. Left internal jugular catheter is identified, distal aspect coiled over the inferior margin of the cardiac silhouette, tip in the region of the proximal aspect of the right ventricle. The cardiac silhouette remains markedly enlarged. There is continued central vascular congestion, with developing left basilar consolidation and/or small effusion. No evidence of pneumothorax on this supine evaluation. No acute bony abnormalities. IMPRESSION: 1. Support devices as above. 2. Central vascular congestion, with developing left basilar consolidation and effusion. Electronically Signed   By: Randa Ngo M.D.   On: 02/04/2021 16:51  ECHOCARDIOGRAM LIMITED  Result Date: 02/03/2021    ECHOCARDIOGRAM LIMITED REPORT   Patient Name:   Mark [name] Date of Exam: 02/03/2021 Medical Rec #:  [id number]        Height:       69.0 in Accession #:    [id number]       Weight:       220.5 lb Date of Birth:  12-19-1960        BSA:          2.153 m Patient Age:    77 years         BP:           153/47 mmHg Patient Gender: M                HR:           45 bpm. Exam Location:  Inpatient Procedure: Limited Echo, Limited Color Doppler and Cardiac Doppler Indications:    Pericardial Effusion I31.3  History:        Patient has prior history of Echocardiogram examinations, most                 recent 01/12/2021. Signs/Symptoms:Dyspnea; Risk                 Factors:Hypertension.  Sonographer:    Mikki Santee RDCS (AE) Referring Phys: [id number] Kipp Brood  Sonographer Comments: Echo performed with patient supine and on artificial respirator. IMPRESSIONS  1. Compared with the echo 01/16/2021,  pericardial effusion is much smaller and tamponade is no longer present.  2. Septal hypokinesis consistent with bundle branch block.  3. Left ventricular ejection fraction, by estimation, is 55 to 60%. The left ventricle has normal function.  4. A small pericardial effusion is present. Findings are consistent with cardiac tamponade.  5. There is moderately elevated pulmonary artery systolic pressure.  6. The inferior vena cava is dilated in size with <50% respiratory variability, suggesting right atrial pressure of 15 mmHg. FINDINGS  Left Ventricle: Left ventricular ejection fraction, by estimation, is 55 to 60%. The left ventricle has normal function. Right Ventricle: There is moderately elevated pulmonary artery systolic pressure. The tricuspid regurgitant velocity is 2.93 m/s, and with an assumed right atrial pressure of 15 mmHg, the estimated right ventricular systolic pressure is 41.7 mmHg. Pericardium: A small pericardial effusion is present. There is evidence of cardiac tamponade. Tricuspid Valve: Tricuspid valve regurgitation is trivial. Venous: The inferior vena cava is dilated in size with less than 50% respiratory variability, suggesting right atrial pressure of 15 mmHg. TRICUSPID VALVE TR Peak grad:   34.3 mmHg TR Vmax:        293.00 cm/s Skeet Latch MD Electronically signed by Skeet Latch MD Signature Date/Time: 02/03/2021/4:47:35 PM    Final     Medications: Infusions: . sodium chloride 10 mL/hr at 02/05/21 1200  . sodium chloride Stopped (02/05/21 1112)  . anidulafungin 78 mL/hr at 02/05/21 1200   Followed by  . [START ON 2021-02-13] anidulafungin    . ceFEPime (MAXIPIME) IV Stopped (02/05/21 1052)  . DOPamine 10 mcg/kg/min (02/05/21 1200)  . feeding supplement (VITAL 1.5 CAL) 1,000 mL (02/03/21 2250)  . fentaNYL infusion INTRAVENOUS Stopped (02/05/21 0459)  . heparin 2,000 Units/hr (02/05/21 1200)  . norepinephrine (LEVOPHED) Adult infusion 9 mcg/min (02/05/21 1200)  .  prismasol B22GK 4/0 500 mL/hr at 02/05/21 0501  . prismasol B22GK 4/0 2,000 mL/hr at 02/05/21 1002  . prismasol B22GK 4/0 500 mL/hr at 02/05/21 0611  .  vasopressin Stopped (02/03/21 1155)    Scheduled Medications: . B-complex with vitamin C  1 tablet Per Tube Daily  . chlorhexidine gluconate (MEDLINE KIT)  15 mL Mouth Rinse BID  . Chlorhexidine Gluconate Cloth  6 each Topical Q0600  . clindamycin   Topical BID  . darbepoetin (ARANESP) injection - DIALYSIS  150 mcg Intravenous Q Fri-HD  . diclofenac Sodium  2 g Topical QID  . docusate  100 mg Per Tube BID  . feeding supplement (PROSource TF)  45 mL Per Tube QID  . insulin aspart  0-9 Units Subcutaneous Q4H  . insulin aspart  4 Units Subcutaneous Q4H  . mouth rinse  15 mL Mouth Rinse 10 times per day  . mupirocin ointment  1 application Nasal BID  . oxyCODONE  10 mg Per Tube Q4H   Followed by  . [START ON 02/07/2021] oxyCODONE  5 mg Per Tube Q4H  . pantoprazole sodium  40 mg Per Tube Daily  . polyethylene glycol  17 g Per Tube Daily  . sennosides  10 mL Per Tube BID  . sodium chloride flush  10-40 mL Intracatheter Q12H    have reviewed scheduled and prn medications.  Physical Exam: General: ill appearing, intubated  Heart: s1s2, irreg irreg,  Lungs: diminished air entry bibasilar, intubated, chest tube with minimal outpt Abdomen:soft, Non-tender, distended Extremities: trace Bilateral lower extremity edema Neuro: awake and alert Dialysis Access: LUE AV fistula, rij temp HD catheter  Mark [name] 02/05/2021,12:02 PM  LOS: 15 days

## 2021-02-05 NOTE — Progress Notes (Signed)
eLink Physician-Brief Progress Note Patient Name: VIRTUS BANISH DOB: 10-Sep-1961 MRN: [id number]   Date of Service  02/05/2021  HPI/Events of Note  Hypercalcemia - Ca++ = 13.3. Nephrology is aware and changed dialysate to 0 Ca++. 02/04/2021.  eICU Interventions  Plan: 1. Continue present management. 2. Please contact nephrology for any further questions about hypercalcemia.      Intervention Category Major Interventions: Electrolyte abnormality - evaluation and management  Lysle Dingwall 02/05/2021, 7:38 PM

## 2021-02-05 NOTE — Progress Notes (Signed)
Nutrition Follow Up  DOCUMENTATION CODES:   Not applicable  INTERVENTION:   No BM in 13 days, abdomen distended, bowel regimen strengthen, glycerin suppository, if no results plan enema  Tube feeding:  -Vital 1.5 @ 55 ml/hr via post pyloric Cortrak (1320 ml) -ProSource TF 45 ml QID  Provides: 2140 kcals, 133 grams protein, 1008 ml free water.   Continue B complex with Vitamin C to account for losses with CRRT  NUTRITION DIAGNOSIS:   Increased nutrient needs related to other (see comment) (new dx of ESRD) as evidenced by estimated needs.  Ongoing  GOAL:   Patient will meet greater than or equal to 90% of their needs   Addressed via TF  MONITOR:  TF tolerance,Labs,I & O's,Weight trends  REASON FOR ASSESSMENT:  Rounds    ASSESSMENT:  60 yo male with a PMH of gout, osteoporosis, HTN, anemia, arthritis, and CKD stage 4 who presents with fluid overload.   3/16 - s/p DCCV 3/19 - trial HD, difficulty breathing, CRRT started 3/22 - CRRT stopped 3/23 - transition to iHD, post pyloric Cortrak placed, EN initiated  3/24 - PEA arrest, intubated, s/p pericardial drain 3/27 - CRRT started 3/27 - brief overnight arrest  TVPM placed yesterday given ongoing bradycardia. CRRT running even. Attempting to wean pressors. Vital 1.5 running at 55 ml/hr. Has not had BM in 13 days. CT abd on 3/27 showed stool burden, no ileus or obstruction. Bowel regimen strengthened, glycerin suppository given, may need enema if no results.   EDW undetermined  Admission weight: 111.8 kg  Current weight: 99.6 kg   Patient remains intubated on ventilator support MV: 14.0 L/min Temp (24hrs), Avg:98.1 F (36.7 C), Min:97.8 F (36.6 C), Max:98.4 F (36.9 C)  CRRT: 2521 ml x 24 hrs   Drips: dopamine, levophed Medications: aranesp, SS novolog, miralax, senokot Labs: Phosphorus 6.8 (H) Mg 2.8 (H)   Diet Order:   Diet Order            Diet NPO time specified  Diet effective now                 EDUCATION NEEDS:  Not appropriate for education at this time  Skin:  Skin Assessment: Skin Integrity Issues: (Weeping blister on R thigh) Skin Integrity Issues:: Incisions Incisions: Groin  Last BM:  3/18  Height:  Ht Readings from Last 1 Encounters:  02/03/21 '5\' 9"'$  (1.753 m)   Weight:  Wt Readings from Last 1 Encounters:  02/05/21 99.6 kg   Ideal Body Weight:  72.7 kg  BMI:  Body mass index is 32.43 kg/m.  Estimated Nutritional Needs:   Kcal:  2075 kcal   Protein:  125-145 grams   Fluid:  1000 ml + UOP (liberalize with CRRT)  Mariana Single RD, LDN Clinical Nutrition Pager listed in [facility]

## 2021-02-05 NOTE — Progress Notes (Signed)
[facility] for IV heparin Indication: atrial fibrillation  Allergies  Allergen Reactions  . Penicillins Swelling, Rash and Other (See Comments)    Arms and eyes swell & skin breaks out  Has patient had a PCN reaction causing immediate rash, facial/tongue/throat swelling, SOB or lightheadedness with hypotension: Yes Has patient had a PCN reaction causing severe rash involving mucus membranes or skin necrosis: Yes Has patient had a PCN reaction that required hospitalization: No Has patient had a PCN reaction occurring within the last 10 years: No If all of the above answers are "NO", then may proceed with Cephalosporin use.   . Atorvastatin     Muscle pain in legs  . Rosuvastatin     Muscle pain in legs    Patient Measurements: Height: '5\' 9"'$  (175.3 cm) Weight: 99.6 kg (219 lb 9.3 oz) IBW/kg (Calculated) : 70.7 Heparin Dosing Weight: 95 kg  Vital Signs: Temp: 97.8 F (36.6 C) (03/31 1600) Temp Source: Oral (03/31 1600) BP: 135/43 (03/31 1540) Pulse Rate: 50 (03/31 1913)  Labs: Recent Labs    02/04/21 0328 02/04/21 1600 02/04/21 1900 02/05/21 0337 02/05/21 0511 02/05/21 0824 02/05/21 1714 02/05/21 1811  HGB 8.0*  --   --  8.3* 9.5* 8.5*  --   --   HCT 27.1*  --   --  28.1* 28.0* 28.2*  --   --   PLT 125*  --   --  PLATELET CLUMPS NOTED ON SMEAR, UNABLE TO ESTIMATE  --  96*  --   --   APTT 55*  --   --   --   --  64*  --  66*  HEPARINUNFRC 0.69  --  0.60 0.62  --   --   --   --   CREATININE 1.41* 1.42*  --  1.41*  --   --  1.25*  --     Estimated Creatinine Clearance: 72.2 mL/min (A) (by C-G formula based on SCr of 1.25 mg/dL (H)).   Medical History: Past Medical History:  Diagnosis Date  . Anemia    labs today indicate (09/27/11)  . Arthritis    right knee  . Bleeding ulcer    hx of, required 6 units blood  . Blood transfusion    6 units with ulcer repair surgery  . Chronic kidney disease    stage IV, has fistula,  never started diaylsis  . Complication of anesthesia    little slow to wake up with last surgery  . Dyspnea    with exertion  . Gout   . Hip dislocation, left ([place]) 10/09/2020  . Hypertension   . Osteoporosis   . Pneumonia     Medications:  Infusions:  . sodium chloride 10 mL/hr at 02/05/21 1900  . sodium chloride 10 mL/hr at 02/05/21 1821  . [START ON 18-Feb-2021] anidulafungin    . ceFEPime (MAXIPIME) IV Stopped (02/05/21 1052)  . DOPamine 10 mcg/kg/min (02/05/21 1900)  . feeding supplement (VITAL 1.5 CAL) 55 mL/hr at 02/05/21 1730  . fentaNYL infusion INTRAVENOUS Stopped (02/05/21 1719)  . heparin Stopped (02/05/21 1750)  . norepinephrine (LEVOPHED) Adult infusion 12 mcg/min (02/05/21 1900)  . prismasol B22GK 4/0 500 mL/hr at 02/05/21 1459  . prismasol B22GK 4/0 2,000 mL/hr at 02/05/21 1729  . prismasol B22GK 4/0 500 mL/hr at 02/05/21 1323  . vasopressin Stopped (02/03/21 1155)    Assessment: 60 yo male on chronic anticoagulation with apixaban, was held for pericardial window.  Pharmacy asked  to resume IV heparin. Last dose of apixaban was 3/24 AM.  This morning, HL at goal 0.62 but aPTT is slightly low at 64; levels are still not correlating. Unsure if effects of apixaban are still lingering despite CRRT and last dose being 3/24. Hgb up to 9.5 but platelets are trending down, 96 today. Discussed with CCM- does not feel HIT is likely. Will adjust goals back typical range since pericardial window was ~7d ago and chest tube has had low output.   aPTT 66 on heparin 2000 units/hr but heparin was turned off for bleeding at site of new central line placed and bleeding per CCM. Heparin was off at 1750 and level drawn at 1811 so expect actual level higher than appears due to half life.   Goal of Therapy:  Heparin level 0.3-0.7 aPTT 66-102s Monitor platelets by anticoagulation protocol: Yes  Plan:  RN resuming heparin at 2000 units/hr per CCM  Follow up with 8hr aPTT and bleeding  from central line Monitor daily CBC, HL, s/sx bleeding  Cristela Felt, PharmD Clinical Pharmacist

## 2021-02-05 NOTE — Progress Notes (Addendum)
eLink Physician-Brief Progress Note Patient Name: LOGUN CHINO DOB: [birth year] MRN: [id number]   Date of Service  02/05/2021  HPI/Events of Note  Hypotension - BP = 78/64 with MAP = 71. No CVP. Last Hgb = 8.5. He is now on Norepinephrine IV infusion at 35 mcg/min and Dopamine IV infusion at 20 mcg/kg/min. Importantly, he has a history of cardiac tamponade. Also nursing request for Lacrilube.   eICU Interventions  Plan: 1. Restart Vasopressin IV infusion at shock dose. 2. H/H STAT. 3. ABG STAT. 4. Monitor CVP now and Q 4 hours.  5. Will ask PCCM ground team to evaluate him at bedside with ECHO. 6. Lacrilube ointment to both eyes now and Q 8 hours.     Intervention Category Major Interventions: Hypotension - evaluation and management  Javani Spratt Eugene 02/05/2021, 11:08 PM

## 2021-02-05 NOTE — Progress Notes (Signed)
Date and time results received: 02/05/21 0441  Critical Value: Calcium 13.3  Name of Provider Notified: [place],MD paged  Orders Received: No new orders at this time

## 2021-02-05 NOTE — Progress Notes (Signed)
[facility] for IV heparin Indication: atrial fibrillation  Allergies  Allergen Reactions  . Penicillins Swelling, Rash and Other (See Comments)    Arms and eyes swell & skin breaks out  Has patient had a PCN reaction causing immediate rash, facial/tongue/throat swelling, SOB or lightheadedness with hypotension: Yes Has patient had a PCN reaction causing severe rash involving mucus membranes or skin necrosis: Yes Has patient had a PCN reaction that required hospitalization: No Has patient had a PCN reaction occurring within the last 10 years: No If all of the above answers are "NO", then may proceed with Cephalosporin use.   . Atorvastatin     Muscle pain in legs  . Rosuvastatin     Muscle pain in legs    Patient Measurements: Height: '5\' 9"'$  (175.3 cm) Weight: 99.6 kg (219 lb 9.3 oz) IBW/kg (Calculated) : 70.7 Heparin Dosing Weight: 95 kg  Vital Signs: Temp: 98.3 F (36.8 C) (03/31 0800) Temp Source: Oral (03/31 0800) BP: 101/52 (03/31 0800) Pulse Rate: 60 (03/31 0800)  Labs: Recent Labs    02/03/21 0320 02/03/21 1633 02/03/21 1929 02/04/21 0328 02/04/21 1600 02/04/21 1900 02/05/21 0337 02/05/21 0511 02/05/21 0824  HGB 7.9*  --    < > 8.0*  --   --  8.3* 9.5*  --   HCT 25.6*  --    < > 27.1*  --   --  28.1* 28.0*  --   PLT 145*  --   --  125*  --   --  PLATELET CLUMPS NOTED ON SMEAR, UNABLE TO ESTIMATE  --   --   APTT 48* 64*  --  55*  --   --   --   --  64*  HEPARINUNFRC 0.83*  --   --  0.69  --  0.60 0.62  --   --   CREATININE 1.43* 1.38*  --  1.41* 1.42*  --  1.41*  --   --    < > = values in this interval not displayed.    Estimated Creatinine Clearance: 64 mL/min (A) (by C-G formula based on SCr of 1.41 mg/dL (H)).   Medical History: Past Medical History:  Diagnosis Date  . Anemia    labs today indicate (09/27/11)  . Arthritis    right knee  . Bleeding ulcer    hx of, required 6 units blood  . Blood transfusion     6 units with ulcer repair surgery  . Chronic kidney disease    stage IV, has fistula, never started diaylsis  . Complication of anesthesia    little slow to wake up with last surgery  . Dyspnea    with exertion  . Gout   . Hip dislocation, left ([place]) 10/09/2020  . Hypertension   . Osteoporosis   . Pneumonia     Medications:  Infusions:  . sodium chloride 10 mL/hr at 02/04/21 2100  . sodium chloride 10 mL/hr at 02/05/21 0800  . anidulafungin     Followed by  . [START ON 03/04/21] anidulafungin    . DOPamine 10 mcg/kg/min (02/05/21 0800)  . feeding supplement (VITAL 1.5 CAL) 1,000 mL (02/03/21 2250)  . fentaNYL infusion INTRAVENOUS Stopped (02/05/21 0459)  . heparin 1,900 Units/hr (02/05/21 0800)  . norepinephrine (LEVOPHED) Adult infusion 10 mcg/min (02/05/21 0800)  . prismasol B22GK 4/0 500 mL/hr at 02/05/21 0501  . prismasol B22GK 4/0 2,000 mL/hr at 02/05/21 0728  . prismasol B22GK 4/0 500 mL/hr at  02/05/21 [id number]  . vasopressin Stopped (02/03/21 1155)    Assessment: 60 yo male on chronic anticoagulation with apixaban, was held for pericardial window.  Pharmacy asked to resume IV heparin. Last dose of apixaban was 3/24 AM.  HL at goal 0.62 but aPTT is slightly low at 64; levels are still not correlating. Unsure if effects of apixaban are still lingering despite CRRT and last dose being 3/24. Hgb up to 9.5 but platelets are trending down, 96 today. Discussed with CCM- does not feel HIT is likely.   Will adjust goals back typical range since pericardial window was ~7d ago and chest tube has had low output.   Goal of Therapy:  Heparin level 0.3-0.7 aPTT 66-102s Monitor platelets by anticoagulation protocol: Yes  Plan:  Increase IV heparin to 2,000 units/hr Follow up with 8hr aPTT Monitor daily CBC, HL, s/sx bleeding  Mercy Riding, PharmD PGY1 Acute Care Pharmacy Resident Please refer to [facility] for unit-specific pharmacist

## 2021-02-05 NOTE — Progress Notes (Signed)
Pharmacy Antibiotic Note  Mark [name] is a 60 y.o. male admitted on 01/19/2021. Pt was treated previously with vancomycin for staph epi bacteremia (mecA resistant) and cefepime for proteus UTI. WBC up to ~40. Tmax 98.59F. He remains on CRRT. Pharmacy has been consulted for starting antibiotics for sepsis.   Plan: Initiate Cefepime 2g IV every 12 hours  Initiate Eraxis '200mg'$  IV x1, then '100mg'$  every 24 hours  Follow up with cultures, antibiotic de-escalation and LOT Monitor CRRT off times and clinical progress  Height: '5\' 9"'$  (175.3 cm) Weight: 99.6 kg (219 lb 9.3 oz) IBW/kg (Calculated) : 70.7  Temp (24hrs), Avg:98.1 F (36.7 C), Min:97.8 F (36.6 C), Max:98.4 F (36.9 C)  Recent Labs  Lab 01/30/21 1420 01/30/21 1600 02/02/21 0118 02/02/21 0311 02/02/21 1559 02/03/21 0320 02/03/21 1633 02/04/21 0328 02/04/21 1600 02/05/21 0337  WBC  --    < > 37.6* 38.3*  --  34.2*  --  32.2*  --  39.9*  CREATININE  --    < > 1.85* 1.78*   < > 1.43* 1.38* 1.41* 1.42* 1.41*  VANCORANDOM 21  --   --   --   --   --   --   --   --   --    < > = values in this interval not displayed.    Estimated Creatinine Clearance: 64 mL/min (A) (by C-G formula based on SCr of 1.41 mg/dL (H)).    Allergies  Allergen Reactions  . Penicillins Swelling, Rash and Other (See Comments)    Arms and eyes swell & skin breaks out  Has patient had a PCN reaction causing immediate rash, facial/tongue/throat swelling, SOB or lightheadedness with hypotension: Yes Has patient had a PCN reaction causing severe rash involving mucus membranes or skin necrosis: Yes Has patient had a PCN reaction that required hospitalization: No Has patient had a PCN reaction occurring within the last 10 years: No If all of the above answers are "NO", then may proceed with Cephalosporin use.   . Atorvastatin     Muscle pain in legs  . Rosuvastatin     Muscle pain in legs    Antimicrobials this admission: Ceftriaxone 3/19 >>  3/21 Vanc 3/19 >> 3/28 Cefepime 3/21 >> 3/27, 3/31 >>  Eraxis 3/31 >>   Microbiology results: 3/18 BCx: 2/2 staph epi (+mecA resistance) 3/17 UCx: proteus penneri (R - CTX) 3/19 MRSA PCR: negative 3/19 thigh wound: abundant G+ cocci, no strep or staph  Thank you for allowing pharmacy to be a part of this patient's care.  Mercy Riding, PharmD PGY1 Acute Care Pharmacy Resident Please refer to [facility] for unit-specific pharmacist

## 2021-02-06 ENCOUNTER — Inpatient Hospital Stay (HOSPITAL_COMMUNITY): Payer: 59

## 2021-02-06 ENCOUNTER — Ambulatory Visit: Payer: 59 | Admitting: Internal Medicine

## 2021-02-06 DIAGNOSIS — N179 Acute kidney failure, unspecified: Secondary | ICD-10-CM

## 2021-02-06 DIAGNOSIS — I509 Heart failure, unspecified: Secondary | ICD-10-CM

## 2021-02-06 DIAGNOSIS — E872 Acidosis: Secondary | ICD-10-CM

## 2021-02-06 DIAGNOSIS — R570 Cardiogenic shock: Secondary | ICD-10-CM

## 2021-02-06 LAB — DIC (DISSEMINATED INTRAVASCULAR COAGULATION)PANEL
D-Dimer, Quant: 19.98 ug/mL-FEU — ABNORMAL HIGH (ref 0.00–0.50)
Fibrinogen: 746 mg/dL — ABNORMAL HIGH (ref 210–475)
INR: 3.6 — ABNORMAL HIGH (ref 0.8–1.2)
Platelets: 39 10*3/uL — ABNORMAL LOW (ref 150–400)
Prothrombin Time: 34.7 seconds — ABNORMAL HIGH (ref 11.4–15.2)
Smear Review: NONE SEEN
aPTT: 110 seconds — ABNORMAL HIGH (ref 24–36)

## 2021-02-06 LAB — CBC
HCT: 27.3 % — ABNORMAL LOW (ref 39.0–52.0)
Hemoglobin: 7.7 g/dL — ABNORMAL LOW (ref 13.0–17.0)
MCH: 31 pg (ref 26.0–34.0)
MCHC: 28.2 g/dL — ABNORMAL LOW (ref 30.0–36.0)
MCV: 110.1 fL — ABNORMAL HIGH (ref 80.0–100.0)
Platelets: 49 10*3/uL — ABNORMAL LOW (ref 150–400)
RBC: 2.48 MIL/uL — ABNORMAL LOW (ref 4.22–5.81)
RDW: 18.9 % — ABNORMAL HIGH (ref 11.5–15.5)
WBC: 22.6 10*3/uL — ABNORMAL HIGH (ref 4.0–10.5)
nRBC: 30.1 % — ABNORMAL HIGH (ref 0.0–0.2)

## 2021-02-06 LAB — RENAL FUNCTION PANEL
Albumin: 1.2 g/dL — ABNORMAL LOW (ref 3.5–5.0)
Anion gap: 16 — ABNORMAL HIGH (ref 5–15)
BUN: 36 mg/dL — ABNORMAL HIGH (ref 8–23)
CO2: 14 mmol/L — ABNORMAL LOW (ref 22–32)
Calcium: 14.2 mg/dL (ref 8.9–10.3)
Chloride: 108 mmol/L (ref 98–111)
Creatinine, Ser: 1.46 mg/dL — ABNORMAL HIGH (ref 0.61–1.24)
GFR, Estimated: 54 mL/min — ABNORMAL LOW (ref >60)
Glucose, Bld: 40 mg/dL — CL (ref 70–99)
Phosphorus: 10.5 mg/dL — ABNORMAL HIGH (ref 2.5–4.6)
Potassium: 5 mmol/L (ref 3.5–5.1)
Sodium: 138 mmol/L (ref 135–145)

## 2021-02-06 LAB — POCT I-STAT 7, (LYTES, BLD GAS, ICA,H+H)
Acid-base deficit: 16 mmol/L — ABNORMAL HIGH (ref 0.0–2.0)
Acid-base deficit: 15 mmol/L — ABNORMAL HIGH (ref 0.0–2.0)
Acid-base deficit: 16 mmol/L — ABNORMAL HIGH (ref 0.0–2.0)
Acid-base deficit: 22 mmol/L — ABNORMAL HIGH (ref 0.0–2.0)
Bicarbonate: 13.2 mmol/L — ABNORMAL LOW (ref 20.0–28.0)
Bicarbonate: 12.7 mmol/L — ABNORMAL LOW (ref 20.0–28.0)
Bicarbonate: 12.2 mmol/L — ABNORMAL LOW (ref 20.0–28.0)
Bicarbonate: 7.6 mmol/L — ABNORMAL LOW (ref 20.0–28.0)
Calcium, Ion: 1.98 mmol/L (ref 1.15–1.40)
Calcium, Ion: 2.01 mmol/L (ref 1.15–1.40)
Calcium, Ion: 1.71 mmol/L (ref 1.15–1.40)
Calcium, Ion: 1.54 mmol/L (ref 1.15–1.40)
HCT: 29 % — ABNORMAL LOW (ref 39.0–52.0)
HCT: 25 % — ABNORMAL LOW (ref 39.0–52.0)
HCT: 23 % — ABNORMAL LOW (ref 39.0–52.0)
HCT: 18 % — ABNORMAL LOW (ref 39.0–52.0)
Hemoglobin: 9.9 g/dL — ABNORMAL LOW (ref 13.0–17.0)
Hemoglobin: 8.5 g/dL — ABNORMAL LOW (ref 13.0–17.0)
Hemoglobin: 7.8 g/dL — ABNORMAL LOW (ref 13.0–17.0)
Hemoglobin: 6.1 g/dL — CL (ref 13.0–17.0)
O2 Saturation: 93 %
O2 Saturation: 95 %
O2 Saturation: 96 %
O2 Saturation: 99 %
Patient temperature: 97.6
Patient temperature: 98.1
Patient temperature: 98.4
Patient temperature: 93
Potassium: 5.7 mmol/L — ABNORMAL HIGH (ref 3.5–5.1)
Potassium: 5.6 mmol/L — ABNORMAL HIGH (ref 3.5–5.1)
Potassium: 4.9 mmol/L (ref 3.5–5.1)
Potassium: 5.4 mmol/L — ABNORMAL HIGH (ref 3.5–5.1)
Sodium: 137 mmol/L (ref 135–145)
Sodium: 138 mmol/L (ref 135–145)
Sodium: 141 mmol/L (ref 135–145)
Sodium: 140 mmol/L (ref 135–145)
TCO2: 15 mmol/L — ABNORMAL LOW (ref 22–32)
TCO2: 14 mmol/L — ABNORMAL LOW (ref 22–32)
TCO2: 13 mmol/L — ABNORMAL LOW (ref 22–32)
TCO2: 9 mmol/L — ABNORMAL LOW (ref 22–32)
pCO2 arterial: 44.4 mmHg (ref 32.0–48.0)
pCO2 arterial: 37.3 mmHg (ref 32.0–48.0)
pCO2 arterial: 38.6 mmHg (ref 32.0–48.0)
pCO2 arterial: 28.3 mmHg — ABNORMAL LOW (ref 32.0–48.0)
pH, Arterial: 7.076 — CL (ref 7.350–7.450)
pH, Arterial: 7.137 — CL (ref 7.350–7.450)
pH, Arterial: 7.106 — CL (ref 7.350–7.450)
pH, Arterial: 7.015 — CL (ref 7.350–7.450)
pO2, Arterial: 90 mmHg (ref 83.0–108.0)
pO2, Arterial: 95 mmHg (ref 83.0–108.0)
pO2, Arterial: 114 mmHg — ABNORMAL HIGH (ref 83.0–108.0)
pO2, Arterial: 172 mmHg — ABNORMAL HIGH (ref 83.0–108.0)

## 2021-02-06 LAB — LACTIC ACID, PLASMA: Lactic Acid, Venous: >11 mmol/L (ref 0.5–1.9)

## 2021-02-06 LAB — HEMOGLOBIN AND HEMATOCRIT, BLOOD
HCT: 30.7 % — ABNORMAL LOW (ref 39.0–52.0)
Hemoglobin: 8.7 g/dL — ABNORMAL LOW (ref 13.0–17.0)

## 2021-02-06 LAB — HEPATIC FUNCTION PANEL
ALT: 417 U/L — ABNORMAL HIGH (ref 0–44)
AST: 1710 U/L — ABNORMAL HIGH (ref 15–41)
Albumin: 1.2 g/dL — ABNORMAL LOW (ref 3.5–5.0)
Alkaline Phosphatase: 321 U/L — ABNORMAL HIGH (ref 38–126)
Bilirubin, Direct: 1.3 mg/dL — ABNORMAL HIGH (ref 0.0–0.2)
Indirect Bilirubin: 0.6 mg/dL (ref 0.3–0.9)
Total Bilirubin: 1.9 mg/dL — ABNORMAL HIGH (ref 0.3–1.2)
Total Protein: 5.7 g/dL — ABNORMAL LOW (ref 6.5–8.1)

## 2021-02-06 LAB — GLUCOSE, CAPILLARY
Glucose-Capillary: 26 mg/dL — CL (ref 70–99)
Glucose-Capillary: 68 mg/dL — ABNORMAL LOW (ref 70–99)
Glucose-Capillary: 98 mg/dL (ref 70–99)
Glucose-Capillary: 72 mg/dL (ref 70–99)

## 2021-02-06 LAB — APTT: aPTT: 87 seconds — ABNORMAL HIGH (ref 24–36)

## 2021-02-06 LAB — HEPARIN LEVEL (UNFRACTIONATED): Heparin Unfractionated: 0.85 IU/mL — ABNORMAL HIGH (ref 0.30–0.70)

## 2021-02-06 LAB — MAGNESIUM: Magnesium: 3.8 mg/dL — ABNORMAL HIGH (ref 1.7–2.4)

## 2021-02-06 LAB — COOXEMETRY PANEL
Carboxyhemoglobin: 0.7 % (ref 0.5–1.5)
Methemoglobin: 1.2 % (ref 0.0–1.5)
O2 Saturation: 46.2 %
Total hemoglobin: 9.9 g/dL — ABNORMAL LOW (ref 12.0–16.0)

## 2021-02-06 MED ORDER — DEXTROSE 50 % IV SOLN
INTRAVENOUS | Status: AC
  Administered 2021-02-06: 12.5 g via INTRAVENOUS
  Filled 2021-02-06: qty 50

## 2021-02-06 MED ORDER — VANCOMYCIN HCL 2000 MG/400ML IV SOLN
2000.0000 mg | Freq: Once | INTRAVENOUS | Status: AC
  Administered 2021-02-06: 2000 mg via INTRAVENOUS
  Filled 2021-02-06: qty 400

## 2021-02-06 MED ORDER — HEPARIN SODIUM (PORCINE) 1000 UNIT/ML DIALYSIS
1000.0000 [IU] | INTRAMUSCULAR | Status: DC | PRN
  Filled 2021-02-06: qty 3
  Filled 2021-02-06: qty 6

## 2021-02-06 MED ORDER — SODIUM BICARBONATE 8.4 % IV SOLN
INTRAVENOUS | Status: DC
  Filled 2021-02-06 (×2): qty 850

## 2021-02-06 MED ORDER — EPINEPHRINE HCL 5 MG/250ML IV SOLN IN NS
0.5000 ug/min | INTRAVENOUS | Status: DC
  Administered 2021-02-06: 20 ug/min via INTRAVENOUS
  Administered 2021-02-06: 10 ug/min via INTRAVENOUS
  Filled 2021-02-06 (×2): qty 250

## 2021-02-06 MED ORDER — SODIUM BICARBONATE 8.4 % IV SOLN: 50.0000 meq | Freq: Once | INTRAVENOUS | Status: AC

## 2021-02-06 MED ORDER — HEPARIN SOD (PORK) LOCK FLUSH 100 UNIT/ML IV SOLN
500.0000 [IU] | Freq: Once | INTRAVENOUS | Status: DC
  Filled 2021-02-06: qty 5

## 2021-02-06 MED ORDER — CALCIUM GLUCONATE-NACL 1-0.675 GM/50ML-% IV SOLN: 1.0000 g | Freq: Once | INTRAVENOUS | Status: DC

## 2021-02-06 MED ORDER — SODIUM BICARBONATE 8.4 % IV SOLN
100.0000 meq | Freq: Once | INTRAVENOUS | Status: AC
  Administered 2021-02-06: 100 meq via INTRAVENOUS
  Filled 2021-02-06: qty 100

## 2021-02-06 MED ORDER — DEXTROSE 50 % IV SOLN
INTRAVENOUS | Status: AC
  Filled 2021-02-06: qty 50

## 2021-02-06 MED ORDER — HYDROCORTISONE NA SUCCINATE PF 100 MG IJ SOLR
50.0000 mg | Freq: Four times a day (QID) | INTRAMUSCULAR | Status: DC
  Administered 2021-02-06: 50 mg via INTRAVENOUS
  Filled 2021-02-06: qty 2

## 2021-02-06 MED ORDER — SODIUM CHLORIDE 0.9 % IV BOLUS
1000.0000 mL | INTRAVENOUS | Status: AC
  Administered 2021-02-06: 1000 mL via INTRAVENOUS

## 2021-02-06 MED ORDER — SODIUM BICARBONATE 8.4 % IV SOLN
INTRAVENOUS | Status: AC
  Administered 2021-02-06: 50 meq
  Filled 2021-02-06: qty 50

## 2021-02-06 MED ORDER — FENTANYL CITRATE (PF) 100 MCG/2ML IJ SOLN
100.0000 ug | Freq: Once | INTRAMUSCULAR | Status: AC
Start: 2021-02-06 — End: 2021-02-06
  Administered 2021-02-06: 100 ug via INTRAVENOUS

## 2021-02-06 MED ORDER — VANCOMYCIN HCL 1000 MG/200ML IV SOLN: 1000.0000 mg | INTRAVENOUS | Status: DC

## 2021-02-06 MED ORDER — MIDAZOLAM HCL 2 MG/2ML IJ SOLN
2.0000 mg | Freq: Once | INTRAMUSCULAR | Status: AC
  Administered 2021-02-06: 2 mg via INTRAVENOUS

## 2021-02-06 MED ORDER — DEXTROSE 50 % IV SOLN
25.0000 g | INTRAVENOUS | Status: AC
  Administered 2021-02-06: 25 g via INTRAVENOUS

## 2021-02-06 MED ORDER — VECURONIUM BROMIDE 10 MG IV SOLR
INTRAVENOUS | Status: AC
  Filled 2021-02-06: qty 10

## 2021-02-06 MED ORDER — SODIUM BICARBONATE 8.4 % IV SOLN
INTRAVENOUS | Status: AC
  Filled 2021-02-06: qty 50

## 2021-02-06 MED ORDER — ALBUMIN HUMAN 25 % IV SOLN
25.0000 g | Freq: Once | INTRAVENOUS | Status: AC
  Administered 2021-02-06: 25 g via INTRAVENOUS
  Filled 2021-02-06: qty 100

## 2021-02-06 MED ORDER — SODIUM BICARBONATE 8.4 % IV SOLN
INTRAVENOUS | Status: AC
  Administered 2021-02-06: 50 meq
  Filled 2021-02-06: qty 100

## 2021-02-06 MED ORDER — VECURONIUM BROMIDE 10 MG IV SOLR: 10.0000 mg | Freq: Once | INTRAVENOUS | Status: DC

## 2021-02-06 MED ORDER — SODIUM BICARBONATE 8.4 % IV SOLN
100.0000 meq | Freq: Once | INTRAVENOUS | Status: AC
  Administered 2021-02-06: 100 meq via INTRAVENOUS

## 2021-02-06 MED ORDER — DEXTROSE 50 % IV SOLN: 12.5000 g | INTRAVENOUS | Status: AC

## 2021-02-06 MED ORDER — SODIUM CHLORIDE 0.9 % IV BOLUS
1000.0000 mL | Freq: Once | INTRAVENOUS | Status: AC
  Administered 2021-02-06: 1000 mL via INTRAVENOUS

## 2021-02-06 MED ORDER — DEXTROSE 10 % IV SOLN: INTRAVENOUS | Status: DC

## 2021-02-06 DEATH — deceased

## 2021-02-09 ENCOUNTER — Telehealth: Payer: Self-pay | Admitting: Internal Medicine

## 2021-02-09 LAB — PATHOLOGIST SMEAR REVIEW

## 2021-02-09 NOTE — Telephone Encounter (Signed)
[place] calling to see if Dr. Margaretann Loveless would be willing to sign off on plan or care orders for the patient. She states they usually have them signed by the patient's PCP, but they are refusing since they did not send the orders.

## 2021-02-09 NOTE — Telephone Encounter (Signed)
Returned call to [place] from [place] [facility]. Mark [name] states that the home health order needs to be signed for patients plan of care.   Mark [name] will forward papers to be signed to the office for Dr. Margaretann Loveless to review. Dr. Kaylyn Layer will sign if appropriate. Mark [name] from [place] aware and verbalized understanding.

## 2021-02-11 NOTE — Telephone Encounter (Signed)
Returned call to [place] at [place]- advised her that the home health care forms were signed by Dr. Margaretann Loveless and faxed back over to [facility] at Fax number provided. Abbie Sons to call back with any issues. Mariann Laster verbalized understanding.

## 2021-02-11 NOTE — Telephone Encounter (Signed)
Mark [name] with [facility] is following up. She would like to know if patient's plan of care paperwork has been signed. She requested a call back with an update.  Phone #: [phone]

## 2021-02-11 NOTE — Telephone Encounter (Signed)
Called Mark [name], advised I would send a message over to Dr.Acharya's nurse to advise on this paperwork.  Mark [name] verbalized understanding, thankful for call back.

## 2021-03-08 NOTE — Progress Notes (Addendum)
  PCCM Interval Note  Called to bedside for deteriorating hemodynamics despite ongoing aggressive measures throughout the night by EMD.   Earlier bedside echo by Dr. Earlie Server without evidence of recurrent pericardial effusion or tamponade.   Currently maxed on Epi 20, NE 90, vasopressin, dopamine 20 On abx coverage CVP 9 Maxed on vent settings to optimize PH 0300 labs noted.  Stable Hgb, decreasing Plt,  Ongoing hypoglycemia s/p D50 iCa 2.01, corrected Ca 16.2 Increased abd distention noted  Blood pressure (!) 80/55, pulse 60, temperature 97.9 F (36.6 C), temperature source Axillary, resp. rate (!) 26, height '5\' 9"'$  (1.753 m), weight 99.6 kg, SpO2 100 %.  Vent Mode: PRVC FiO2 (%):  [40 %-60 %] 60 % Set Rate:  [22 bmp-35 bmp] 35 bmp Vt Set:  [560 mL] 560 mL PEEP:  [5 cmH20] 5 cmH20 Pressure Support:  [10 cmH20] 10 cmH20 Plateau Pressure:  [18 cmH20-28 cmH20] 28 cmH20    P:  - CRRT back running - adding stress dose steroids  - pending lactate - check coox - albumin x 2 now given CVP 9 on multiple pressors  - bicarb now for goal PH > 7.2, increase gtt to 200 ml/hr - ongoing hypoglycemia- will check LFTs and increase D10 for glucose > 70 - reaching out to Nephrology for hypercalcemia, hopefully albumin will help, lasix not an option given hemodynamics. Defer further options to nephrology - check KUB given abd distention; will place OGT to suction - consider repeat formal TTE this am - brother has been called in to readdress [place] and have time with patient has it seems he has taken a turn for the worse and may not survive the morning despite all efforts   CCT 20 mins   Kennieth Rad, ACNP [facility] 03-04-2021, 5:47 AM

## 2021-03-08 NOTE — Progress Notes (Signed)
Pharmacy Antibiotic Note  Mark [name] is a 60 y.o. male admitted on 01/16/2021 with sepsis.  Pharmacy has been consulted for Vancomycin dosing. Pt also on Cefepime and Anidulafungin.  Pt continues on CRRT.  Plan: Vancomycin '2000mg'$  IV now then  1000 mg IV Q 24 hrs. Will f/u renal function, micro data, and pt's clinical condition Vanc levels prn   Height: '5\' 9"'$  (175.3 cm) Weight: 99.6 kg (219 lb 9.3 oz) IBW/kg (Calculated) : 70.7  Temp (24hrs), Avg:97.3 F (36.3 C), Min:94.2 F (34.6 C), Max:98.3 F (36.8 C)  Recent Labs  Lab 01/30/21 1420 01/30/21 1600 02/03/21 0320 02/03/21 1633 02/04/21 0328 02/04/21 1600 02/05/21 0337 02/05/21 0824 02/05/21 1714 23-Feb-2021 0244  WBC  --    < > 34.2*  --  32.2*  --  39.9* 38.3*  --  22.6*  CREATININE  --    < > 1.43*   < > 1.41* 1.42* 1.41*  --  1.25* 1.46*  VANCORANDOM 21  --   --   --   --   --   --   --   --   --    < > = values in this interval not displayed.    Estimated Creatinine Clearance: 61.9 mL/min (A) (by C-G formula based on SCr of 1.46 mg/dL (H)).    Allergies  Allergen Reactions  . Penicillins Swelling, Rash and Other (See Comments)    Arms and eyes swell & skin breaks out  Has patient had a PCN reaction causing immediate rash, facial/tongue/throat swelling, SOB or lightheadedness with hypotension: Yes Has patient had a PCN reaction causing severe rash involving mucus membranes or skin necrosis: Yes Has patient had a PCN reaction that required hospitalization: No Has patient had a PCN reaction occurring within the last 10 years: No If all of the above answers are "NO", then may proceed with Cephalosporin use.   . Atorvastatin     Muscle pain in legs  . Rosuvastatin     Muscle pain in legs    Antimicrobials this admission: Ceftriaxone 3/19 >> 3/21 Vanc 3/19 >> 3/28; restarted 4/1 >> Cefepime 3/21 >> 3/27, 3/31 >>  Eraxis 3/31 >>   Microbiology results: 3/18 BCx: 2/2 staph epi (+mecA resistance) 3/17  UCx: proteus penneri (R - CTX) 3/19 MRSA PCR: negative 3/19 thigh wound: abundant G+ cocci, no strep or staph 3/24 BCx: negative  Thank you for allowing pharmacy to be a part of this patient's care.  Franky Macho Feb 23, 2021 6:22 AM

## 2021-03-08 NOTE — Progress Notes (Signed)
eLink Physician-Brief Progress Note Patient Name: Mark [name] DOB: 1961-08-06 MRN: [id number]   Date of Service  2021-02-07  HPI/Events of Note  Multiple issues: 1. ABG on 40%/PRVC 30/TV 560/P 5 = 7.076/44.4/90 and 2. Nursing states that the patient is non responsive, with no gag or cough and pupils are unequal. The patient is currently too unstable to go the CT Scan at this time. Video assessment reveals that the patient is asynchronous with the mechanical ventilator.   eICU Interventions  Plan: 1. Increase PRVC to 35. 2. Will request that the ground team evaluate the patient at bedside.      Intervention Category Major Interventions: Change in mental status - evaluation and management;Respiratory failure - evaluation and management;Acid-Base disturbance - evaluation and management  Shantell Belongia Eugene 02/07/2021, 2:16 AM

## 2021-03-08 NOTE — Progress Notes (Signed)
Critical care brief note  Called to bedside by E link physician.  Patient become hypoxic, progressively hypotensive and asynchronous with the ventilator.  On arrival is found that the patient has significant air leak and.  The endotracheal tube was no longer within the tracheal airway.  glidoscope inserted via the oral pharynx.  Appears that the balloon of the endotracheal tube was above the vocal cords.  Balloon was deflated and the endotracheal tube was advanced under direct visualization.  Tidal volumes on the ventilator immediately improved as well as the blood pressure.  Patient did receive Versed and fentanyl for the procedure.  Chest x-ray demonstrated the tip of the endotracheal tube was just above the level of carina by about 1.5 cm.  We will leave the tube in that position.  Wean dopamine infusion first.  Maintain MAP greater than 60.  Resume CRRT now.

## 2021-03-08 NOTE — Progress Notes (Signed)
   2021-02-23 1145  Clinical Encounter Type  Visited With Family  Visit Type Death  Referral From Nurse  Consult/Referral To Chaplain  Spiritual Encounters  Spiritual Needs Emotional;Grief support  Stress Factors  Family Stress Factors Loss  Chaplain received a page from the Unit Sec Ms. Doris that the patient in February 11, 2023 had passed and the family was at bedside.   Chaplain responded, visited with the family, offered comfort and support, my presence was not needed at this time but informed the family if they need me to return, please have the nurse page for the chaplain.  Chaplain Reyce Lubeck [place] [phone]

## 2021-03-08 NOTE — Progress Notes (Signed)
RT NOTES: Pt removed from life support after time of death.

## 2021-03-08 NOTE — Progress Notes (Addendum)
eLink Physician-Brief Progress Note Patient Name: Mark [name] DOB: Mar 10, 1961 MRN: [id number]   Date of Service  February 08, 2021  HPI/Events of Note  Hypotension - BP = 105/27 with MAP = 48 on Norepinephrine IV infusion at 70 mcg/min, Dopamine IV infusion at 20 mcg/kg/min and Vasopressin IV infusion at shock dose. Abdomen is now distended.   eICU Interventions  Plan: 1. Increase ceiling on Norepinephrine IV infusion to 90 mcg/min. 2. NaHCO3 100 meq IV now. 3. Add Epinephine IV infusion. Titrate to MAP >= 65. 4. Hold tube feeds. 5. NGT to LIS. 6. Portable abdominal X-ray STAT. 7. Lactic Acid level now. 8. Will ask PCCM ground team to evaluate the patient at bedside.      Intervention Category Major Interventions: Hypotension - evaluation and management  Britian Jentz Eugene 2021/02/08, 4:45 AM

## 2021-03-08 NOTE — Significant Event (Signed)
    Interdisciplinary Goals of Care Family Meeting   Date carried out:: 10-Feb-2021  Location of the meeting: Bedside  Member's involved: Physician, Nurse Practitioner, Bedside Registered Nurse and Family Member or next of kin  Durable Power of Attorney or acting medical decision maker: [name], brother  Discussion: We discussed goals of care for [employer] .  Patient has rapidly declined tonight despite maximum medical therapies.  Remains refractory acidotic, shock, and MODS.   Code status: limited code.  No CPR or further escalation of care  Disposition: Continue current acute care  Time spent for the meeting: 15 mins       Kennieth Rad, ACNP [facility] February 10, 2021, 6:50 AM

## 2021-03-08 NOTE — Progress Notes (Signed)
Dr Shearon Stalls made aware of seizure like activity. Night shift RN reported no previous seizure like activity prior to this event. Dr Shearon Stalls gave verbal order to proceed with present order for versed for seizures.

## 2021-03-08 NOTE — Progress Notes (Signed)
CCM discussed patient situation with brother at bedside. Decision was made to transition to comfort care once 3rd brother arrived. Family was made aware that do to how unstable the patient was the patient may pass before the 3rd brother made it and they agreed to continue to wait. Patient passed at [phone] with family at the bedside. CCM Dr Shearon Stalls was notified of TOD. RRT was notified to remove patient from ventilator.

## 2021-03-08 NOTE — Progress Notes (Signed)
NAME:  Mark [name], MRN:  [id number], DOB:  1961-02-21, LOS: 35 ADMISSION DATE:  01/10/2021, CONSULTATION DATE: 3/19 REFERRING MD:  Dr. Erlinda Hong, CHIEF COMPLAINT:  TRH    History of Present Illness:  60 y/o M who presented to [facility] on 3/16 for elective cardioversion but presented with shortness of breath, leg swelling and palpitations.   The patient has known CKD IV with baseline sr cr of 5, conservative management, L arm AVF in place in anticipation of HD.  On presentation, he was found to have significant volume overload needing admission per TRH.  After cardioversion he became transiently hypotensive and briefly required neosynephrine gtt.  He was admitted and Nephrology was consulted. Hospital course notable for Proteus UTI, anemia, intolerance to initial HD (chest pain, SVT) requiring brief CVVHD and BIPAP use.  In the ICU he continued to require bipap. He was transitioned to [facility] and had one session on 3/23 with 3L removed / -750 for day.  In the early am of 3/24 the patient pulled his bipap mask off and suffered a respiratory arrest requiring intubation and one round of CPR.   Pertinent  Medical History  CKD IV - HD started this admit (01/30/2021) HTN HLD Chronic Diastolic CHF  Moderate AS Atrial Flutter - on Eliquis  Obesity  Anemia of Chronic Disease   Significant Hospital Events: Including procedures, antibiotic start and stop dates in addition to other pertinent events    3/16 Admit after cardioversion, SOB, leg swelling / palpitations prior to presentation   3/19 PCCM consulted   3/20 CRRT started  3/21 Improved mental status, wore BiPAP overnight. Staph epidermis growing from [facility]. UC growing proteus, abx changed from ceftriaxone to cefepime.  Fever 100.2, -3.6L in last 24 hours.    3/21 CVVHD stopped.  3/23 Tolerated iHD  3/24 Respiratory arrest, PEA, (pulled bipap off), intubated, re-cultured, ECHO tamponade, Pericardial window, Pericardial drain 28Fr by Julien Girt  3/25  palliative care consulted   3/27 CVVHD, pressors levo + vaso   CT chest showed no clear intraabdominal abscess.  3/27 brief overnight arrest when bit down on ETT  3/28 ECHO shows no significant residual effusion   3/29 bradycardia required TCP, amiodarone and midodrine stopped.   3/31 overnight > profound refractory shock, acidosis, MODS.  Pressors escalated significantly.  ETT dislodged and replaced.  Family called and care plan changed to focus on comfort after arrival of family in AM 4/1  4/1 Transition to comfort care once family arrives this AM  Interim History / Subjective:  Remains in profound shock despite norepi, epi, vaso, dopa, stress steroids, bicarb, CRRT.  Brother at bedside and ready to transition to comfort once 2nd brother arrives this morning.  Objective   Blood pressure (!) 75/29, pulse 60, temperature 98.4 F (36.9 C), temperature source Axillary, resp. rate (!) 35, height 5' 9"  (1.753 m), weight 103.7 kg, SpO2 99 %. CVP:  [4 mmHg-23 mmHg] 7 mmHg  Vent Mode: PRVC FiO2 (%):  [40 %-60 %] 60 % Set Rate:  [22 bmp-35 bmp] 35 bmp Vt Set:  [560 mL] 560 mL PEEP:  [5 cmH20] 5 cmH20 Pressure Support:  [10 cmH20] 10 cmH20 Plateau Pressure:  [18 cmH20-28 cmH20] 28 cmH20 IPAP 20 EPAP 8  Intake/Output Summary (Last 24 hours) at March 03, 2021 0842 Last data filed at 03-03-21 0800 Gross per 24 hour  Intake 7280.56 ml  Output 2199 ml  Net 5081.56 ml   Filed Weights   02/04/21 0342 02/05/21 0400 Mar 03, 2021 0500  Weight: 99.4 kg 99.6 kg 103.7 kg    Examination: General: Adult male, critically ill, actively dying. Neuro: Sedated, not responsive. HEENT: [place]/AT. Sclerae anicteric. ETT in place. Cardiovascular: RRR, no M/R/G.  Lungs: Respirations even and unlabored.  CTA bilaterally, No W/R/R. Abdomen: Abd slightly distended.  BS hypoactive, soft, NT/ND.  Musculoskeletal: No gross deformities, no edema.  Skin: Intact, warm, no rashes.   Resolved Hospital Problem list      Assessment & Plan:   PEA Arrest suspect respiratory driven as pulled bipap off + tamponade Cardiac Tamponade due to uremia.  Acute Hypercarbic Respiratory Failure in setting of Acute Diastolic CHF CKD V / New ESRD now with refractory acidosis despite CRRT Acute Exacerbation of dCHF Atrial Fibrillation / Flutter  - now bradycardic Chronic Hypotension Acute on Chronic Diastolic HF Moderate Aortic Stenosis  Macrocytic Anemia  BPH Septic Shock - refractory Proteus UTI Chronic Hydradenitis / L Groin Wound, Small Wound on Right Thigh Leukocytosis -- stable, no abscess on CT Left Shoulder Pain  Moderate Protein Calorie Malnutrition Presumed perforated viscus given suspected free air on abdominal xray  GOC:  Plan:  - PCCM night team had an extensive discussion with pt's brother at bedside discussing current circumstances with rapid decline and now refractory shock with MODS. They also discussed patient's prior wishes under circumstances such as this. The family decided not to perform resuscitation if arrest were to occur, but to otherwise continue with current medical support / therapies until further family could arrive in AM 4/1. - AM 4/1, I personally met with brother at bedside and confirmed above plan.  Brother in agreement for transition to comfort care once 2nd brother arrives mid morning.  - RN Minette Brine present for above discussion and aware to please notify me once 2nd brother arrives and family is ready for transition to comfort. - In the meantime, continue all supportive measures at current level without escalation.   Best practice (evaluated daily)  Diet:  TF Pain/Anxiety/Delirium protocol (if indicated): Yes (RASS goal -1) on fentanyl VAP protocol (if indicated): Not indicated DVT prophylaxis: Systemic AC GI prophylaxis: PPI - increased bowel regimen Glucose control:  Basal insulin No Central venous access:  Yes, and it is still needed - will resite groin lines.   Arterial line:  N/A Foley:  N/A Mobility:  bed rest  PT consulted: N/A Last date of multidisciplinary goals of care discussion: Palliative care meeting 01/31/2021.  I spoke to patient's brother 3/31 at told him that ultimate goal is to get patient to tolerate intermittent dialysis. If this is not possible then the patient would be in an ultimately non-survivable situation.  Code Status:  full code Disposition: ICU   CC time: 30 min.   Montey Hora, [facility] For pager details, please see AMION or use Epic chat  After 1900, please call [facility] for cross coverage needs

## 2021-03-08 NOTE — Progress Notes (Signed)
eLink Physician-Brief Progress Note Patient Name: Mark [name] DOB: 06/13/1961 MRN: [id number]   Date of Service  February 24, 2021  HPI/Events of Note  Hypotension - BP = 108/34 with MAP = 52. CVP = 5. Hgb = 10.2. Patient on Norepinephrine and Dopamine IV infusions at ceilings.  eICU Interventions  Plan: 1. ABG STAT. 2. Bolus with 0.9 NaCl 1 liter IV over 1 hour now. 3. Increase ceiling on Norepinephrine IV infusion to 70 mcg/min.      Intervention Category Major Interventions: Other:  [name] 02/24/21, 1:21 AM

## 2021-03-08 NOTE — Death Summary Note (Signed)
DEATH SUMMARY   Patient Details  Name: Mark [name] MRN: [id number] DOB: 1961-09-08  Admission/Discharge Information   Admit Date:  2021/02/16  Date of Death: Date of Death: 04-Mar-2021  Time of Death: Time of Death: 0853  Length of Stay: 02-17-2023  Referring Physician: Delrae Rend, MD   Reason(s) for Hospitalization  Renal failure  Diagnoses  Preliminary cause of death: Chronic Kidney Disease, secondary to long standing DM2, HTN.  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   Benign essential HTN   Septic shock (HCC)   CKD (chronic kidney disease), stage IV (HCC)   Atrial flutter (HCC)   Acute on chronic diastolic heart failure (HCC)   Acquired thrombophilia (HCC)   Acute respiratory distress   Respiratory failure, acute ([place])   Encounter for central line placement   Arterial line in place   Cardiac arrest ([place])   Hypotension   Cardiac/pericardial tamponade   Counseling regarding advance care planning and goals of care   Full code status   Brief Hospital Course (including significant findings, care, treatment, and services provided and events leading to death)   Mark [name] was a 60 y.o. gentleman with past medical history of HTN, DM, CKD Stage V who presented initially over 2 weeks prior to admission for planned cardioversion with volume overload.  Found to have UTI and renal failure started on CVVHD.  Had subsequent PEA arrest secondary to cardiac tamponade and had pericardial window placement on 3/24. Required intubation when he pulled his BIPAP off. Persistent shock and fevers with no source of infection noted on CT scan. Had recurrent cardiac arrest with vagal response. Developed bradycardia and progressive shock refractory to vasopressors and inotropic agents. Now in multiorgan failure despite all  aggressive measures and support devices. Plan is to conitnue vent support, pressors, CVVHD and bicarb. He was made DNR overnight. He was transitioned to comfort measures once his family arrived.    Pertinent Labs and Studies  Significant Diagnostic Studies CT ABDOMEN PELVIS WO CONTRAST  Result Date: 02/01/2021 CLINICAL DATA:  Sepsis, status post cardioversion on Feb 17, 2023. EXAM: CT ABDOMEN AND PELVIS WITHOUT CONTRAST TECHNIQUE: Multidetector CT imaging of the abdomen and pelvis was performed following the standard protocol without IV contrast. COMPARISON:  Chest x-ray 02/01/2021, CT chest 11/30/2019. CT abdomen pelvis 04/29/2020 FINDINGS: Lower chest: At least small volume partially visualized pericardial effusion. Bilateral trace pleural effusions, left greater than right. Bilateral lower lobe heterogeneous consolidation. Marked mitral annular calcifications. Hepatobiliary: No focal liver abnormality. No gallstones, gallbladder wall thickening, or pericholecystic fluid. No biliary dilatation. Pancreas: No focal lesion. Normal pancreatic contour. No surrounding inflammatory changes. No main pancreatic ductal dilatation. Spleen: Normal in size without focal abnormality. Adrenals/Urinary Tract: No adrenal nodule bilaterally. Multiple subcentimeter hypo and hyperdensities within the kidneys are too small to characterize. There there is a 1.7 cm fluid density lesion within left kidney likely represents a simple renal cyst. No nephrolithiasis, no hydronephrosis, and no contour-deforming renal mass. No ureterolithiasis or hydroureter. The urinary bladder is decompressed. Stomach/Bowel: Enteric tube with tip terminating in the second portion of the duodenum. Stomach is within normal limits. No evidence of bowel wall thickening or dilatation. Stool throughout the ascending colon and transverse colon with no definite pneumatosis (6:38, 51). Scattered sigmoid diverticulosis. Appendix appears normal. Vascular/Lymphatic:  Left femoral approach arterial and venous catheters terminating in the external iliac  vessels. No abdominal aorta or iliac aneurysm. Mild atherosclerotic plaque of the aorta and its branches. No abdominal, pelvic, or inguinal lymphadenopathy. Reproductive: Not well visualized due to streak artifact originating from the femoral surgical hardware. Other: Upper abdomen surgical clips. Trace left upper abdomen simple free fluid. No intraperitoneal free gas. No organized fluid collection. Musculoskeletal: Fat containing supraumbilical and periumbilical hernias ([id number]). No suspicious lytic or blastic osseous lesions. No acute displaced fracture. Multilevel degenerative changes of the spine. Partially visualized left hip arthroplasty. IMPRESSION: 1. Bilateral lower lobe heterogeneous consolidations likely reflecting a combination of atelectasis and infection/inflammation. 2. At least small volume partially visualized pericardial effusion. 3. Bilateral trace pleural effusions, left greater than right. 4. Stool throughout the ascending colon and transverse colon with no definite pneumatosis. Limited evaluation for ischemia on this noncontrast study. Given appearance of these loops of large bowel, recommend close clinical follow-up with serial abdominal exams as pneumatosis cannot be fully excluded. Correlate with lactate levels. 5. Scattered sigmoid diverticulosis with no findings of acute diverticulitis. 6. Multiple subcentimeter hyper and hypodense renal lesions that are too small to characterize. 7. Aortic Atherosclerosis (ICD10-I70.0) including marked mitral annular calcifications. Electronically Signed   By: Iven Finn M.D.   On: 02/01/2021 18:06   DG Chest 1 View  Result Date: 01/14/2021 CLINICAL DATA:  Hypoxia EXAM: CHEST  1 VIEW COMPARISON:  January 29, 2021 study obtained earlier in the day FINDINGS: Endotracheal tube tip is 4.3 cm above the carina. Central catheter tip is in the superior vena cava. Enteric  tube tip is below the diaphragm. There is a chest tube on the left. There is a left apical pneumothorax without tension component. There is persistent airspace consolidation throughout portions of the right mid and lower lung regions, stable. Atelectatic changes are noted in the left mid lung and left base regions. There is stable cardiomegaly with pulmonary vascularity within normal limits. No adenopathy evident. No bone lesions. IMPRESSION: Tube and catheter positions as described. Chest tube present on the left small left apical pneumothorax. No tension component. Consolidation in portions of the right mid and lower lung regions remains. Atelectatic change in the left mid and lower lung regions present. Stable cardiomegaly. Electronically Signed   By: Lowella Grip III M.D.   On: 02/03/2021 20:27   DG Chest 2 View  Result Date: 01/22/2021 CLINICAL DATA:  Pleuritic chest pain.  Shortness of breath. EXAM: CHEST - 2 VIEW COMPARISON:  Chest x-ray dated December 14, 2020. FINDINGS: Cardiomediastinal silhouette remains enlarged. Normal pulmonary vascularity. Unchanged streaky linear atelectasis and/or scarring in the lingula and both lower lobes. No focal consolidation, pleural effusion, or pneumothorax. No acute osseous abnormality. IMPRESSION: 1. No acute cardiopulmonary disease. Electronically Signed   By: Titus Dubin M.D.   On: 01/22/2021 13:59   DG Abd 1 View  Result Date: 02/21/21 CLINICAL DATA:  Nasogastric tube placement EXAM: ABDOMEN - 1 VIEW COMPARISON:  5:54 a.m. FINDINGS: Since the prior examination, a nasogastric tube is been placed with its tip overlying the expected gastric fundus. Nasoenteric feeding tube has been slightly withdrawn, however, its tip still appears within the expected second portion of the duodenum. There is better visualized lucency overlying the hepatic shadow and a triangular opacity likely representing the falciform ligament within the right upper quadrant in keeping  with free intraperitoneal gas. Moderate gaseous distension of the transverse colon noted in keeping with a probable colonic ileus. IMPRESSION: Free intraperitoneal gas suspected. Dedicated CT imaging is recommended for confirmation and further  evaluation. Nasogastric tube tip within the gastric fundus. Nasoenteric feeding tube tip within the second portion of the duodenum. These results will be called to the ordering clinician or representative by the Radiologist Assistant, and communication documented in the PACS or [employer]. Electronically Signed   By: Fidela Salisbury MD   On: 02/15/2021 06:19   DG Abd 1 View  Result Date: 01/24/2021 CLINICAL DATA:  Feeding tube placement EXAM: ABDOMEN - 1 VIEW COMPARISON:  None. FINDINGS: Feeding tube tip is at the level of the first portion of the duodenum. There is moderate air in the stomach. There is moderate stool in the colon. There is no bowel dilatation or air-fluid level in visualized abdomen region. No free air evident on supine examination. IMPRESSION: Feeding tube tip at first portion of duodenum. No overt bowel obstruction or free air evident on somewhat limited supine examination. Electronically Signed   By: Lowella Grip III M.D.   On: 01/28/2021 20:28   CT HEAD WO CONTRAST  Result Date: 02/04/2021 CLINICAL DATA:  Acute neurologic deficit EXAM: CT HEAD WITHOUT CONTRAST TECHNIQUE: Contiguous axial images were obtained from the base of the skull through the vertex without intravenous contrast. COMPARISON:  Head CT 10/08/2020 FINDINGS: Brain: There is no mass, hemorrhage or extra-axial collection. The size and configuration of the ventricles and extra-axial CSF spaces are normal. There is hypoattenuation of the white matter, most commonly indicating chronic small vessel disease. Vascular: No abnormal hyperdensity of the major intracranial arteries or dural venous sinuses. No intracranial atherosclerosis. Skull: The visualized skull base, calvarium  and extracranial soft tissues are normal. Sinuses/Orbits: No fluid levels or advanced mucosal thickening of the visualized paranasal sinuses. No mastoid or middle ear effusion. The orbits are normal. IMPRESSION: Chronic small vessel disease without acute intracranial abnormality. Electronically Signed   By: Ulyses Jarred M.D.   On: 02/04/2021 21:12   DG Chest Port 1 View  Result Date: 02-15-2021 CLINICAL DATA:  Check endotracheal tube placement EXAM: PORTABLE CHEST 1 VIEW COMPARISON:  02/05/2021 FINDINGS: Endotracheal tube is noted just above the carina. This could be withdrawn 1-2 cm. Right subclavian central line, right jugular line and left jugular temporary pacing device are seen. Cardiac shadow remains enlarged. Feeding catheter is noted within the stomach. Mild left basilar atelectasis is noted. Left-sided chest tube is seen. No pneumothorax is noted. IMPRESSION: Tubes and lines as described above. Mild left basilar atelectasis and effusion are seen. Electronically Signed   By: Inez Catalina M.D.   On: 02-15-21 03:02   DG CHEST PORT 1 VIEW  Result Date: 02/05/2021 CLINICAL DATA:  Central line placement EXAM: PORTABLE CHEST 1 VIEW COMPARISON:  02/05/2021 FINDINGS: Two frontal views of the chest are obtained. Endotracheal tube and enteric catheter unchanged. Left internal jugular catheter coiled over the cardiac silhouette, tip in the region of the right ventricle. Right internal jugular dual lumen catheter overlies superior vena cava. Right subclavian catheter tip overlies SVC. Cardiac silhouette is enlarged but stable. Continued left basilar consolidation and left pleural effusion. Left-sided chest tube coiled over left hilum unchanged. No evidence of pneumothorax. IMPRESSION: 1. Support devices as above. 2. Persistent left basilar consolidation and left effusion. Electronically Signed   By: Randa Ngo M.D.   On: 02/05/2021 17:57   DG CHEST PORT 1 VIEW  Result Date: 02/05/2021 CLINICAL DATA:   Atrial ventricular heart block. EXAM: PORTABLE CHEST 1 VIEW COMPARISON:  February 04, 2021. FINDINGS: Stable cardiomegaly. Endotracheal and feeding tubes are unchanged in position.  Bilateral internal jugular catheters are unchanged. Left-sided chest tube is noted without pneumothorax. Right lung is clear. Mild left basilar atelectasis is noted. Bony thorax is unremarkable. IMPRESSION: Stable support apparatus. Left-sided chest tube is unchanged without evidence of pneumothorax. Mild left basilar atelectasis is noted. Electronically Signed   By: Marijo Conception M.D.   On: 02/05/2021 11:36   DG CHEST PORT 1 VIEW  Result Date: 02/04/2021 CLINICAL DATA:  Central line placement, transvenous pacer EXAM: PORTABLE CHEST 1 VIEW COMPARISON:  02/02/2021 FINDINGS: Single frontal view of the chest demonstrates endotracheal tube overlying tracheal air column tip midway between thoracic inlet and carina. Enteric catheter passes below diaphragm tip excluded by collimation. Left chest tube again noted unchanged. External defibrillator pads overlie the cardiac silhouette. There is a right internal jugular catheter tip overlying superior vena cava. Left internal jugular catheter is identified, distal aspect coiled over the inferior margin of the cardiac silhouette, tip in the region of the proximal aspect of the right ventricle. The cardiac silhouette remains markedly enlarged. There is continued central vascular congestion, with developing left basilar consolidation and/or small effusion. No evidence of pneumothorax on this supine evaluation. No acute bony abnormalities. IMPRESSION: 1. Support devices as above. 2. Central vascular congestion, with developing left basilar consolidation and effusion. Electronically Signed   By: Randa Ngo M.D.   On: 02/04/2021 16:51   DG Chest Port 1 View  Result Date: 02/02/2021 CLINICAL DATA:  Status post cardiac arrest EXAM: PORTABLE CHEST 1 VIEW COMPARISON:  02/01/2021 FINDINGS: Cardiac  shadow is enlarged but stable. Endotracheal tube, feeding catheter and right jugular central line are again seen and stable. Increased airspace opacity is noted likely representing patchy edema related to the recent arrest. No bony abnormality is seen. IMPRESSION: Increasing patchy airspace opacity likely representing edema. Tubes and lines as described. Electronically Signed   By: Inez Catalina M.D.   On: 02/02/2021 00:24   DG CHEST PORT 1 VIEW  Result Date: 02/01/2021 CLINICAL DATA:  Questionable sepsis EXAM: PORTABLE CHEST 1 VIEW COMPARISON:  Three days ago FINDINGS: Endotracheal tube with tip just below the clavicular heads. The feeding tube at least reaches the stomach. Right IJ line with tip near the SVC origin. Cardiomegaly. Straight margin along the right heart border suggesting lobar collapse. The right diaphragm is less well seen than on remote imaging. There is a left chest tube in place. No visible pneumothorax. IMPRESSION: 1. Increased left pleural effusion. 2. Suspect right lower lobe collapse. Electronically Signed   By: Monte Fantasia M.D.   On: 02/01/2021 11:09   DG CHEST PORT 1 VIEW  Result Date: 01/27/2021 CLINICAL DATA:  Intubation.  Pulmonary edema. EXAM: PORTABLE CHEST 1 VIEW COMPARISON:  01/09/2021. FINDINGS: Endotracheal tube, feeding tube, right IJ line stable position. Severe cardiomegaly again noted. Low lung volumes with persistent prominent bibasilar atelectasis. Mild infiltrate left mid lung cannot be excluded on today's exam. No prominent pleural effusion. No pneumothorax. IMPRESSION: 1.  Lines and tubes in stable position. 2.  Severe cardiomegaly again noted. 3. Low lung volumes with persistent prominent bibasilar atelectasis. Mild infiltrate left mid lung cannot be excluded on today's exam. Electronically Signed   By: Marcello Moores  Register   On: 01/10/2021 07:12   DG CHEST PORT 1 VIEW  Result Date: 01/27/2021 CLINICAL DATA:  Respiratory failure EXAM: PORTABLE CHEST 1 VIEW  COMPARISON:  Radiograph 01/28/2021, CT 11/30/2019 FINDINGS: Endotracheal tube terminates in the mid trachea, 4 cm from the carina. Transesophageal tube tip terminates below  the margins of imaging, beyond the GE junction. Right IJ catheter tip terminates in the mid SVC. Redemonstration of the extreme cardiomegaly. Some increasing retrocardiac opacity bilaterally likely reflecting worsening volume loss/atelectatic changes of the lower lobes with some mild pulmonary vascular congestion and hazy interstitial opacity suggestive of some edematous change. No pneumothorax. Partial obscuration of the hemidiaphragms may be related to atelectasis or layering fluid. No acute osseous or other soft tissue abnormality. IMPRESSION: 1. Lines and tubes as above. 2. Increasing retrocardiac opacity in the medial lung bases likely reflecting worsening volume loss/atelectasis in lower lobes. 3. Persisting hazy opacities and vascular congestion suggesting at least mild interstitial edema. Possible effusions. Electronically Signed   By: Lovena Le M.D.   On: 02/03/2021 01:33   DG CHEST PORT 1 VIEW  Result Date: 01/28/2021 CLINICAL DATA:  Pulmonary edema, shortness of breath EXAM: PORTABLE CHEST 1 VIEW COMPARISON:  Radiograph 01/20/2021 FINDINGS: Right IJ approach central venous catheter tip terminates in the mid to upper SVC. Extreme cardiomegaly is similar to priors. Persistent pulmonary vascular congestion. Increasing hazy and patchy opacities are seen in the left mid to lower lung with more confluent opacity in the right retrocardiac space as well. No pneumothorax. Obscuration of the bilateral hemidiaphragms may reflect some layering bilateral effusions though there is some partial collimation peripherally. No acute osseous or soft tissue abnormality. IMPRESSION: Worsening hazy and patchy opacities in the left mid to lower lung with more confluent opacity in the right retrocardiac space. Findings are most compatible with worsening  pulmonary edema. Electronically Signed   By: Lovena Le M.D.   On: 01/28/2021 06:35   DG CHEST PORT 1 VIEW  Result Date: 01/06/2021 CLINICAL DATA:  Respiratory failure EXAM: PORTABLE CHEST 1 VIEW COMPARISON:  01/25/2021 FINDINGS: Central line unchanged with tip at high SVC. Patient rotated right. Marked cardiomegaly. No pleural effusion or pneumothorax. The Chin overlies the apices. Resolved interstitial edema with mild pulmonary venous congestion remaining. Suboptimal evaluation of the lung bases secondary to patient's size, technique, and the extent of cardiomegaly. IMPRESSION: Cardiomegaly with resolution of congestive heart failure. Possible bibasilar airspace disease, suboptimally evaluated. Electronically Signed   By: Abigail Miyamoto M.D.   On: 01/25/2021 09:00   DG CHEST PORT 1 VIEW  Result Date: 01/25/2021 CLINICAL DATA:  Central line placement. EXAM: PORTABLE CHEST 1 VIEW COMPARISON:  January 24, 2021 FINDINGS: Right central venous line is identified with distal tip in superior vena cava. There is no pneumothorax. The heart size is enlarged. Increased pulmonary interstitium is identified bilaterally. Osseous structures are stable. IMPRESSION: Right central venous line distal tip in the superior vena cava. No pneumothorax. Congestive heart failure Electronically Signed   By: Abelardo Diesel M.D.   On: 01/25/2021 12:05   DG Chest Port 1 View  Result Date: 01/24/2021 CLINICAL DATA:  Respiratory distress EXAM: PORTABLE CHEST 1 VIEW COMPARISON:  01/22/2021 FINDINGS: Markedly enlarged cardiac pericardial silhouette, potentially worsened from 01/22/2021 although some of the difference is probably projectional given that today's exam is obtained AP and the prior exam was PA. Stable scarring or atelectasis in the lingula. Mildly increased atelectasis along the left hemidiaphragm. Stable scarring or atelectasis along the right hemidiaphragm. IMPRESSION: 1. Markedly enlarged cardiac pericardial silhouette,  probably overall similar to 10/09/2020 and 01/22/2021 when AP projection is taken into account. 2. Bibasilar and left midlung scarring versus atelectasis. Electronically Signed   By: Van Clines M.D.   On: 01/24/2021 19:12   DG Shoulder Left  Result Date: 01/22/2021  CLINICAL DATA:  Acute left shoulder pain. EXAM: LEFT SHOULDER - 2+ VIEW COMPARISON:  None. FINDINGS: There is no evidence of fracture or dislocation. There is no evidence of arthropathy or other focal bone abnormality. Soft tissues are unremarkable. IMPRESSION: Negative. Electronically Signed   By: Marijo Conception M.D.   On: 01/22/2021 18:53   DG Abd Portable 1V  Result Date: 28-Feb-2021 CLINICAL DATA:  Ileus, abdominal distension EXAM: PORTABLE ABDOMEN - 1 VIEW COMPARISON:  01/06/2021 FINDINGS: Nasoenteric feeding tube has been advanced with its tip now within the expected second portion of the duodenum. There is moderate gaseous distension of the transverse colon noted. The flanks bilaterally and pelvis are excluded from view. There is, however, lucency within the epigastric region with a triangular opacity overlying the hepatic silhouette which may reflect the falciform ligament in the setting of free intraperitoneal gas. IMPRESSION: Nasoenteric feeding tube tip within the second portion of the duodenum. Limited evaluation of the abdominal gas pattern. Possible free intraperitoneal gas. A standard two view abdominal radiograph with upright or decubitus views is recommended for further evaluation. These results will be called to the ordering clinician or representative by the Radiologist Assistant, and communication documented in the PACS or [employer]. Electronically Signed   By: Fidela Salisbury MD   On: 02/28/2021 06:06   DG Abd Portable 1V  Result Date: 01/28/2021 CLINICAL DATA:  Feeding tube placement. EXAM: PORTABLE ABDOMEN - 1 VIEW COMPARISON:  None. FINDINGS: The bowel gas pattern is normal. Distal tip of Dobbhoff feeding  tube is seen in expected portion of second portion of duodenum. No radio-opaque calculi or other significant radiographic abnormality are seen. IMPRESSION: Distal tip of Dobbhoff feeding tube is seen in expected portion of second portion of duodenum. No evidence of bowel obstruction or ileus. Electronically Signed   By: Marijo Conception M.D.   On: 01/28/2021 15:06   ECHOCARDIOGRAM COMPLETE  Result Date: 01/07/2021    ECHOCARDIOGRAM REPORT   Patient Name:   Mark [name] Date of Exam: 01/24/2021 Medical Rec #:  [id number]        Height:       69.0 in Accession #:    [id number]       Weight:       225.5 lb Date of Birth:  1960-11-29        BSA:          2.174 m Patient Age:    1 years         BP:           85/61 mmHg Patient Gender: M                HR:           69 bpm. Exam Location:  Inpatient Procedure: 2D Echo, Cardiac Doppler and Color Doppler Indications:    I31.3 Pericardial effusion  History:        Patient has prior history of Echocardiogram examinations, most                 recent 10/09/2020. Signs/Symptoms:Dyspnea; Risk                 Factors:Hypertension. CKD.  Sonographer:    Jonelle Sidle Dance Referring Phys: [id number] Francesca Jewett  Sonographer Comments: Echo performed with patient supine and on artificial respirator and patient is morbidly obese. Image acquisition challenging due to patient body habitus. IMPRESSIONS  1. There is large circumferential pericardial effusion with signs of hemodynamic  compromise, a pericardiocentesis is recommended. The attending physician Dr Harrell Gave was notified.  2. Left ventricular ejection fraction, by estimation, is 60 to 65%. The left ventricle has normal function. The left ventricle has no regional wall motion abnormalities. Left ventricular diastolic function could not be evaluated.  3. Right ventricular systolic function is normal. The right ventricular size is underfilled.  4. Left atrial size was mildly dilated.  5. Large pericardial effusion. The  pericardial effusion is circumferential. Findings are consistent with cardiac tamponade.  6. The mitral valve is normal in structure. Mild mitral valve regurgitation. No evidence of mitral stenosis.  7. The aortic valve is normal in structure. There is severe calcifcation of the aortic valve. There is severe thickening of the aortic valve. Aortic valve regurgitation is mild. Moderate aortic valve stenosis. Aortic valve mean gradient measures 28.0 mmHg.  8. There is borderline dilatation of the ascending aorta, measuring 40 mm.  9. The inferior vena cava is dilated in size with <50% respiratory variability, suggesting right atrial pressure of 15 mmHg. FINDINGS  Left Ventricle: Left ventricular ejection fraction, by estimation, is 60 to 65%. The left ventricle has normal function. The left ventricle has no regional wall motion abnormalities. The left ventricular internal cavity size was normal in size. There is  no left ventricular hypertrophy. Left ventricular diastolic function could not be evaluated due to atrial fibrillation. Left ventricular diastolic function could not be evaluated. Right Ventricle: The right ventricular size is underfilled. No increase in right ventricular wall thickness. Right ventricular systolic function is normal. Left Atrium: Left atrial size was mildly dilated. Right Atrium: Right atrial size was normal in size. Pericardium: A large pericardial effusion is present. The pericardial effusion is circumferential. There is evidence of cardiac tamponade. Mitral Valve: The mitral valve is normal in structure. There is moderate thickening of the mitral valve leaflet(s). There is moderate calcification of the mitral valve leaflet(s). Mild mitral valve regurgitation. No evidence of mitral valve stenosis. Tricuspid Valve: The tricuspid valve is normal in structure. Tricuspid valve regurgitation is not demonstrated. No evidence of tricuspid stenosis. Aortic Valve: The aortic valve is normal in  structure. There is severe calcifcation of the aortic valve. There is severe thickening of the aortic valve. Aortic valve regurgitation is mild. Aortic regurgitation PHT measures 333 msec. Moderate aortic stenosis is present. Aortic valve mean gradient measures 28.0 mmHg. Aortic valve peak gradient measures 44.6 mmHg. Aortic valve area, by VTI measures 0.99 cm. Pulmonic Valve: The pulmonic valve was normal in structure. Pulmonic valve regurgitation is not visualized. No evidence of pulmonic stenosis. Aorta: The aortic root is normal in size and structure. There is borderline dilatation of the ascending aorta, measuring 40 mm. Venous: The inferior vena cava is dilated in size with less than 50% respiratory variability, suggesting right atrial pressure of 15 mmHg. IAS/Shunts: No atrial level shunt detected by color flow Doppler.  LEFT VENTRICLE PLAX 2D LVIDd:         4.90 cm LVIDs:         3.40 cm LV PW:         1.00 cm LV IVS:        0.90 cm LVOT diam:     1.90 cm LV SV:         57 LV SV Index:   26 LVOT Area:     2.84 cm  RIGHT VENTRICLE          IVC RV Basal diam:  3.30 cm  IVC diam:  3.00 cm TAPSE (M-mode): 2.5 cm LEFT ATRIUM              Index       RIGHT ATRIUM           Index LA diam:        4.20 cm  1.93 cm/m  RA Area:     23.50 cm LA Vol (A2C):   156.0 ml 71.75 ml/m RA Volume:   70.40 ml  32.38 ml/m LA Vol (A4C):   119.0 ml 54.73 ml/m LA Biplane Vol: 139.0 ml 63.93 ml/m  AORTIC VALVE AV Area (Vmax):    0.87 cm AV Area (Vmean):   0.95 cm AV Area (VTI):     0.99 cm AV Vmax:           334.00 cm/s AV Vmean:          227.000 cm/s AV VTI:            0.576 m AV Peak Grad:      44.6 mmHg AV Mean Grad:      28.0 mmHg LVOT Vmax:         103.05 cm/s LVOT Vmean:        76.000 cm/s LVOT VTI:          0.201 m LVOT/AV VTI ratio: 0.35 AI PHT:            333 msec  AORTA Ao Root diam: 4.00 cm Ao Asc diam:  4.00 cm MITRAL VALVE MV Area (PHT): 3.12 cm     SHUNTS MV Decel Time: 243 msec     Systemic VTI:  0.20 m MV E  velocity: 116.00 cm/s  Systemic Diam: 1.90 cm MV A velocity: 69.90 cm/s MV E/A ratio:  1.66 Ena Dawley MD Electronically signed by Ena Dawley MD Signature Date/Time: 01/06/2021/4:45:35 PM    Final    ECHOCARDIOGRAM LIMITED  Result Date: 02/03/2021    ECHOCARDIOGRAM LIMITED REPORT   Patient Name:   Mark [name] Date of Exam: 02/03/2021 Medical Rec #:  [id number]        Height:       69.0 in Accession #:    [id number]       Weight:       220.5 lb Date of Birth:  July 05, 1961        BSA:          2.153 m Patient Age:    24 years         BP:           153/47 mmHg Patient Gender: M                HR:           45 bpm. Exam Location:  Inpatient Procedure: Limited Echo, Limited Color Doppler and Cardiac Doppler Indications:    Pericardial Effusion I31.3  History:        Patient has prior history of Echocardiogram examinations, most                 recent 02/03/2021. Signs/Symptoms:Dyspnea; Risk                 Factors:Hypertension.  Sonographer:    Mikki Santee RDCS (AE) Referring Phys: [id number] Kipp Brood  Sonographer Comments: Echo performed with patient supine and on artificial respirator. IMPRESSIONS  1. Compared with the echo 01/20/2021, pericardial effusion is much smaller and tamponade is no longer present.  2. Septal hypokinesis consistent with bundle branch block.  3. Left ventricular ejection fraction, by estimation, is 55 to 60%. The left ventricle has normal function.  4. A small pericardial effusion is present. Findings are consistent with cardiac tamponade.  5. There is moderately elevated pulmonary artery systolic pressure.  6. The inferior vena cava is dilated in size with <50% respiratory variability, suggesting right atrial pressure of 15 mmHg. FINDINGS  Left Ventricle: Left ventricular ejection fraction, by estimation, is 55 to 60%. The left ventricle has normal function. Right Ventricle: There is moderately elevated pulmonary artery systolic pressure. The tricuspid regurgitant velocity  is 2.93 m/s, and with an assumed right atrial pressure of 15 mmHg, the estimated right ventricular systolic pressure is 123456 mmHg. Pericardium: A small pericardial effusion is present. There is evidence of cardiac tamponade. Tricuspid Valve: Tricuspid valve regurgitation is trivial. Venous: The inferior vena cava is dilated in size with less than 50% respiratory variability, suggesting right atrial pressure of 15 mmHg. TRICUSPID VALVE TR Peak grad:   34.3 mmHg TR Vmax:        293.00 cm/s Skeet Latch MD Electronically signed by Skeet Latch MD Signature Date/Time: 02/03/2021/4:47:35 PM    Final     Microbiology Recent Results (from the past 240 hour(s))  Culture, blood (routine x 2)     Status: None   Collection Time: 01/23/2021  7:11 AM   Specimen: BLOOD  Result Value Ref Range Status   Specimen Description BLOOD CENTRAL LINE  Final   Special Requests   Final    BOTTLES DRAWN AEROBIC AND ANAEROBIC Blood Culture adequate volume   Culture   Final    NO GROWTH 5 DAYS Performed at [facility], [address] [place], [place] [address]    Report Status 02/03/2021 FINAL  Final  Culture, blood (routine x 2)     Status: None   Collection Time: 01/28/2021  7:14 AM   Specimen: BLOOD  Result Value Ref Range Status   Specimen Description BLOOD RIGHT ANTECUBITAL  Final   Special Requests   Final    BOTTLES DRAWN AEROBIC AND ANAEROBIC Blood Culture adequate volume   Culture   Final    NO GROWTH 5 DAYS Performed at [facility], [place] [address] [place],  [address]    Report Status 02/03/2021 FINAL  Final    Lab Basic Metabolic Panel: Recent Labs  Lab 02/02/21 0311 02/02/21 1559 02/03/21 0320 02/03/21 1633 02/04/21 0328 02/04/21 1600 02/05/21 [id number] 02/05/21 [id number] 02/05/21 1714 02/05/21 2328 Feb 22, 2021 0148 02-22-21 0244 Feb 22, 2021 0345 02/22/2021 0535 2021-02-22 0756  NA 133*   < > 134*   < > 132* 133* 133*   < > 132*   < > 137 138 138 141 140  K 4.0   < > 3.8   < >  4.4 4.8 4.9   < > 5.1   < > 5.7* 5.0 5.6* 4.9 5.4*  CL 100   < > 100   < > 104 108 107  --  107  --   --  108  --   --   --   CO2 26   < > 26   < > 21* 19* 19*  --  17*  --   --  14*  --   --   --   GLUCOSE 222*   < > 148*   < > 201* 133* 152*  --  123*  --   --  40*  --   --   --  BUN 36*   < > 31*   < > 32* 34* 38*  --  36*  --   --  36*  --   --   --   CREATININE 1.78*   < > 1.43*   < > 1.41* 1.42* 1.41*  --  1.25*  --   --  1.46*  --   --   --   CALCIUM 12.1*   < > 12.6*   < > 11.9*  10.9* 13.0* 13.3*  --  13.3*  --   --  14.2*  --   --   --   MG 2.5*  --  2.6*  --  2.7*  --  2.8*  --   --   --   --  3.8*  --   --   --   PHOS 4.8*   < > 4.4   < > 5.4* 6.4* 6.8*  --  7.4*  --   --  10.5*  --   --   --    < > = values in this interval not displayed.   Liver Function Tests: Recent Labs  Lab 02/04/21 1600 02/05/21 0337 02/05/21 1714 02-22-2021 0244 02/22/2021 0519  AST  --   --   --   --  1,710*  ALT  --   --   --   --  417*  ALKPHOS  --   --   --   --  321*  BILITOT  --   --   --   --  1.9*  PROT  --   --   --   --  5.7*  ALBUMIN 1.7* 1.6* 1.5* 1.2* 1.2*   No results for input(s): LIPASE, AMYLASE in the last 168 hours. No results for input(s): AMMONIA in the last 168 hours. CBC: Recent Labs  Lab 02/03/21 0320 02/03/21 1929 02/04/21 0328 02/05/21 [id number] 02/05/21 [id number] 02/05/21 [id number] 02/05/21 2326 2021/02/22 0148 22-Feb-2021 0244 2021-02-22 0345 February 22, 2021 0519 02-22-2021 0535 02/22/2021 0756  WBC 34.2*  --  32.2* 39.9*  --  38.3*  --   --  22.6*  --   --   --   --   NEUTROABS  --   --   --   --   --  34.5*  --   --   --   --   --   --   --   HGB 7.9*   < > 8.0* 8.3*   < > 8.5*   < > 9.9* 7.7* 8.5*  --  7.8* 6.1*  HCT 25.6*   < > 27.1* 28.1*   < > 28.2*   < > 29.0* 27.3* 25.0*  --  23.0* 18.0*  MCV 102.8*  --  105.9* 104.5*  --  106.0*  --   --  110.1*  --   --   --   --   PLT 145*  --  125* PLATELET CLUMPS NOTED ON SMEAR, UNABLE TO ESTIMATE  --  96*  --   --  49*  --  39*  --   --    <  > = values in this interval not displayed.   Cardiac Enzymes: No results for input(s): CKTOTAL, CKMB, CKMBINDEX, TROPONINI in the last 168 hours. Sepsis Labs: Recent Labs  Lab 02/04/21 0328 02/05/21 0337 02/05/21 0824 02/22/2021 0244 02-22-21 0519  WBC 32.2* 39.9* 38.3* 22.6*  --   LATICACIDVEN  --   --   --   --  >  11.0*      Spero Geralds 2021/03/06, 10:54 AM

## 2021-03-08 NOTE — Progress Notes (Signed)
[facility] for IV heparin Indication: atrial fibrillation  Allergies  Allergen Reactions  . Penicillins Swelling, Rash and Other (See Comments)    Arms and eyes swell & skin breaks out  Has patient had a PCN reaction causing immediate rash, facial/tongue/throat swelling, SOB or lightheadedness with hypotension: Yes Has patient had a PCN reaction causing severe rash involving mucus membranes or skin necrosis: Yes Has patient had a PCN reaction that required hospitalization: No Has patient had a PCN reaction occurring within the last 10 years: No If all of the above answers are "NO", then may proceed with Cephalosporin use.   . Atorvastatin     Muscle pain in legs  . Rosuvastatin     Muscle pain in legs    Patient Measurements: Height: '5\' 9"'$  (175.3 cm) Weight: 99.6 kg (219 lb 9.3 oz) IBW/kg (Calculated) : 70.7 Heparin Dosing Weight: 95 kg  Vital Signs: Temp: 97.9 F (36.6 C) (03/31 2300) Temp Source: Axillary (03/31 2300) BP: 80/55 (03/31 2145) Pulse Rate: 60 (04/01 0324)  Labs: Recent Labs    02/04/21 0328 02/04/21 1600 02/04/21 1900 02/05/21 0337 02/05/21 0511 02/05/21 0824 02/05/21 1714 02/05/21 1811 02/05/21 2326 02/05/21 2328 2021/02/10 0148 02-10-21 0244  HGB 8.0*  --   --  8.3*   < > 8.5*  --   --  8.7* 10.2* 9.9*  --   HCT 27.1*  --   --  28.1*   < > 28.2*  --   --  30.7* 30.0* 29.0*  --   PLT 125*  --   --  PLATELET CLUMPS NOTED ON SMEAR, UNABLE TO ESTIMATE  --  96*  --   --   --   --   --   --   APTT 55*  --   --   --   --  64*  --  66*  --   --   --  87*  HEPARINUNFRC 0.69  --  0.60 0.62  --   --   --   --   --   --   --  0.85*  CREATININE 1.41* 1.42*  --  1.41*  --   --  1.25*  --   --   --   --   --    < > = values in this interval not displayed.    Estimated Creatinine Clearance: 72.2 mL/min (A) (by C-G formula based on SCr of 1.25 mg/dL (H)).    Assessment: 60 yo male on chronic anticoagulation with apixaban,  was held for pericardial window.  Pharmacy asked to resume IV heparin. Last dose of apixaban was 3/24 AM.  Heparin level 0.85 - heparin level is actually increasing so this elevation does not seem to be due to apixaban effects any longer, PTT 87 sec (therapeutic) on gtt at 2000 units/hr. No further bleeding from central line since thrombipad was applied.  Goal of Therapy:  Heparin level 0.3-0.7 units/hr Monitor platelets by anticoagulation protocol: Yes  Plan:  Decrease heparin to 1900 units/hr  F/u 8 hr heparin level Monitor daily CBC, heparin level, s/sx bleeding  Sherlon Handing, PharmD, BCPS Please see amion for complete clinical pharmacist phone list 10-Feb-2021 3:29 AM

## 2021-03-08 NOTE — Progress Notes (Signed)
eLink Physician-Brief Progress Note Patient Name: Mark [name] DOB: July 03, 1961 MRN: [id number]   Date of Service  February 07, 2021  HPI/Events of Note  Review of abdominal film reveals the nasoenteric feeding tube tip within the second portion of the duodenum. Limited evaluation of the abdominal gas pattern. Possible free intraperitoneal gas. A standard two view abdominal radiograph with upright or decubitus views is recommended for further evaluation.   eICU Interventions  Will order standard two view abdominal films with decubitus view.      Intervention Category Major Interventions: Other:  Lysle Dingwall 2021-02-07, 6:21 AM

## 2021-03-08 NOTE — Progress Notes (Signed)
RT note. Advanced ETT from 23 to 50 with NP/ MD due to low vt and dysn. With vent, chest xr orderd

## 2021-03-08 NOTE — Progress Notes (Signed)
eLink Physician-Brief Progress Note Patient Name: Mark [name] DOB: [birth year] MRN: [id number]   Date of Service  03-05-2021  HPI/Events of Note  Multiple issues: 1. ABG on 60%/PRVC 35/TV 560/P 5 = 7.134/37.7/96/12.7 and 2. CBG = 26. Already on D5 NaHCO3 IV infusion at 125 mL/hour.  eICU Interventions  Plan: 1. NaHCO3 100 meq IV now. 2. Repeat ABG at 5 AM. 3. Add D10W at 50 mL/hour. 4. D/C Novolog 4 units Q 4 hour tube feed coverage.     Intervention Category Major Interventions: Acid-Base disturbance - evaluation and management;Respiratory failure - evaluation and management;Other:  Libero Puthoff Cornelia Copa 05-Mar-2021, 4:10 AM

## 2021-03-08 NOTE — Progress Notes (Signed)
eLink Physician-Brief Progress Note Patient Name: ZYMIERE ISHII DOB: [birth year] MRN: [id number]   Date of Service  Mar 03, 2021  HPI/Events of Note  Multiple issues: 1. ABG on 40%/PRVC 22/TV 560/P 5 = 7.103/36.4/137. Patient given NaHCO3 50 meq IV. 2. Blood glucose = 120 --> 76.  eICU Interventions  Plan: 1. Give an additional NaHCO3 50 meq IV now. 2. D5 NaHCO3 IV infusion at 100 L/hour. 3. Increase PRVC rate to 30.  4. Repeat ABG at 5 AM.     Intervention Category Major Interventions: Acid-Base disturbance - evaluation and management;Respiratory failure - evaluation and management  Shann Lewellyn Cornelia Copa March 03, 2021, 12:14 AM

## 2021-03-08 DEATH — deceased

## 2021-04-16 ENCOUNTER — Ambulatory Visit: Payer: 59 | Admitting: Cardiology

## 2022-02-24 IMAGING — DX DG CHEST 1V
1 series · 1 of 1 positions shown · non-contrast
Comparison: January 29, 2021 study obtained earlier in the day

CLINICAL DATA: Hypoxia

EXAM:
CHEST  1 VIEW

[chest ap]
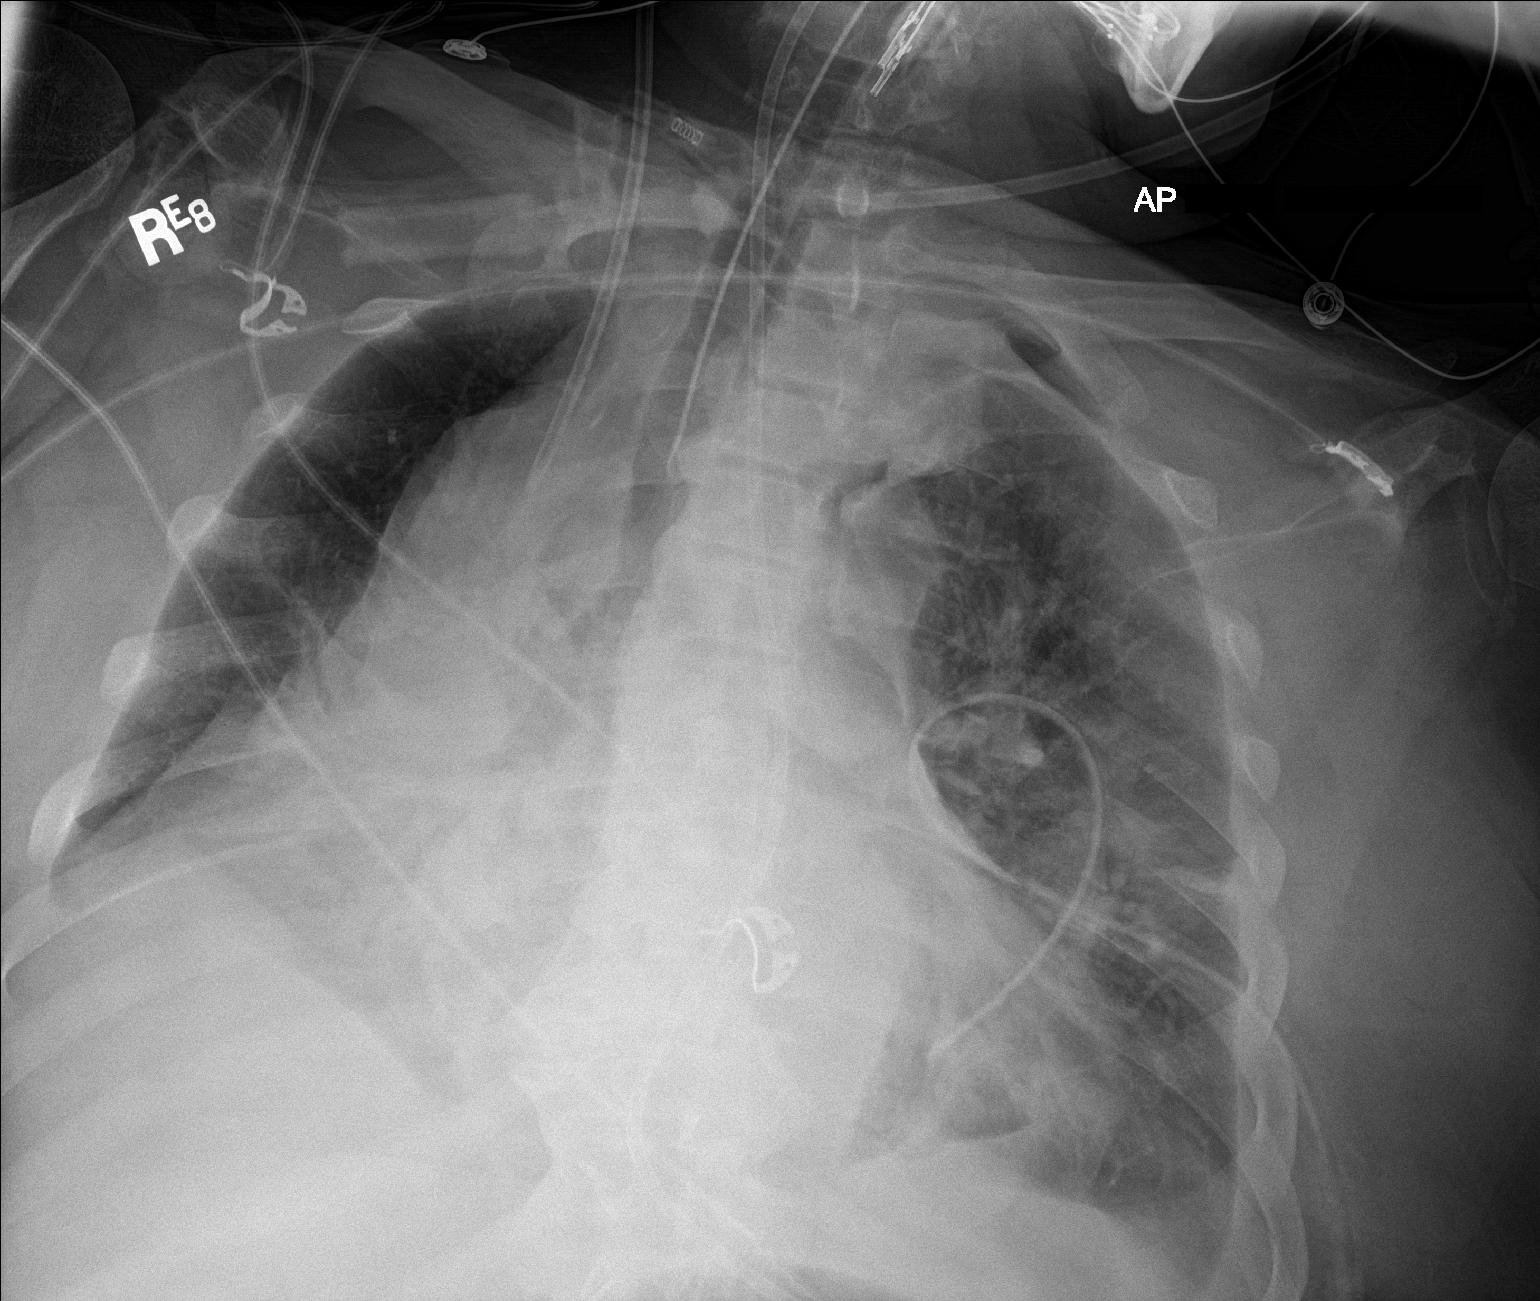

[1 of 1 positions shown; findings below may reference images not displayed]

FINDINGS: Endotracheal tube tip is 4.3 cm above the carina. Central catheter
tip is in the superior vena cava. Enteric tube tip is below the
diaphragm. There is a chest tube on the left. There is a left apical
pneumothorax without tension component.

There is persistent airspace consolidation throughout portions of
the right mid and lower lung regions, stable. Atelectatic changes
are noted in the left mid lung and left base regions. There is
stable cardiomegaly with pulmonary vascularity within normal limits.
No adenopathy evident. No bone lesions.
IMPRESSION: Tube and catheter positions as described. Chest tube present on the
left small left apical pneumothorax. No tension component.
Consolidation in portions of the right mid and lower lung regions
remains. Atelectatic change in the left mid and lower lung regions
present. Stable cardiomegaly.

## 2022-02-27 IMAGING — CT CT ABD-PELV W/O CM
2 of 4 series · 15 of 46 positions shown, 17 images · non-contrast
Comparison: Chest x-ray 02/01/2021, CT chest 11/30/2019. CT abdomen
pelvis 04/29/2020

CLINICAL DATA: Sepsis, status post cardioversion on [DATE].

EXAM:
CT ABDOMEN AND PELVIS WITHOUT CONTRAST
TECHNIQUE: Multidetector CT imaging of the abdomen and pelvis was performed
following the standard protocol without IV contrast.

[Series 3: ap without · axial · non-contrast · 0.95mm/px · z∈[-470,-24]mm · 12 of 99 slices shown, 14 images]
[im 5/99  soft-tissue]
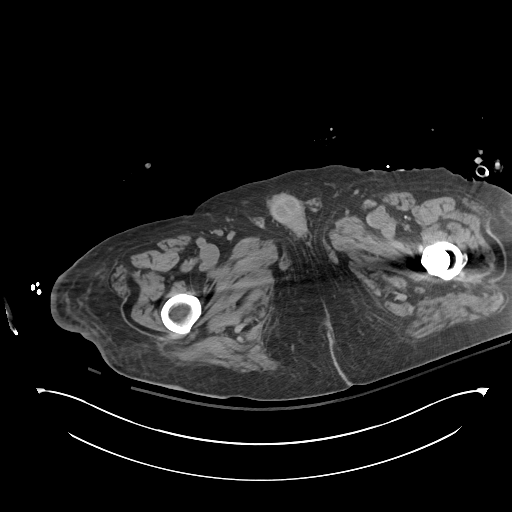
[im 5/99  bone]
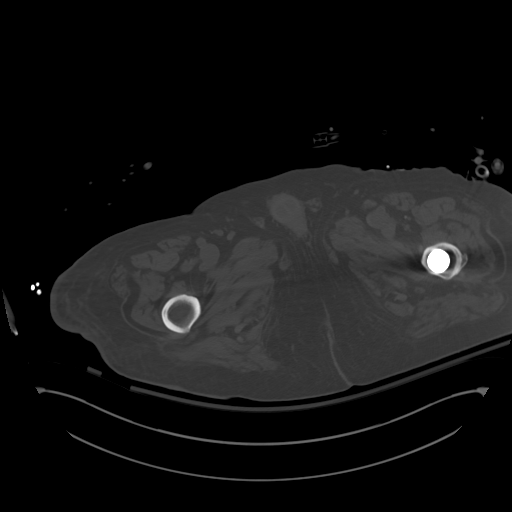
[im 15/99  soft-tissue]
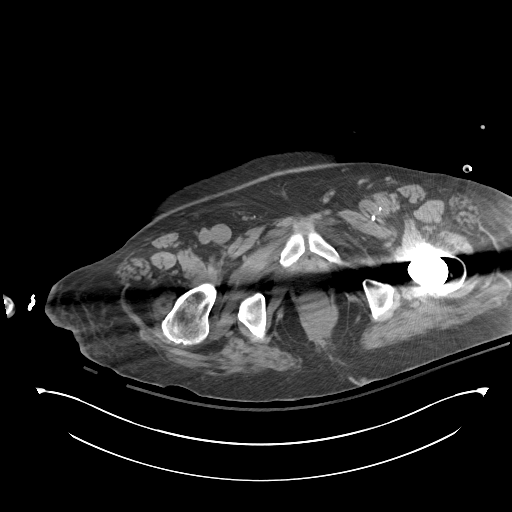
[im 24/99  soft-tissue]
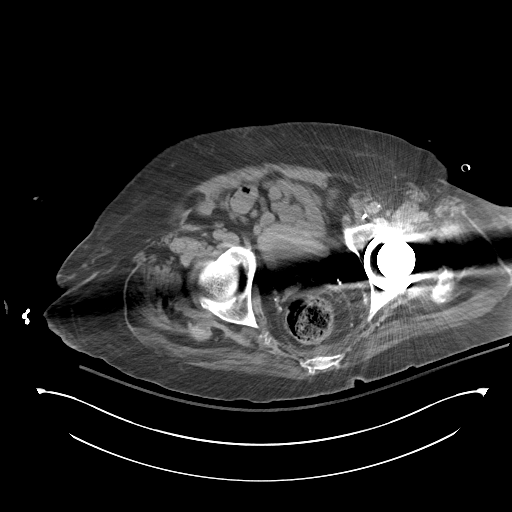
[im 29/99  soft-tissue]
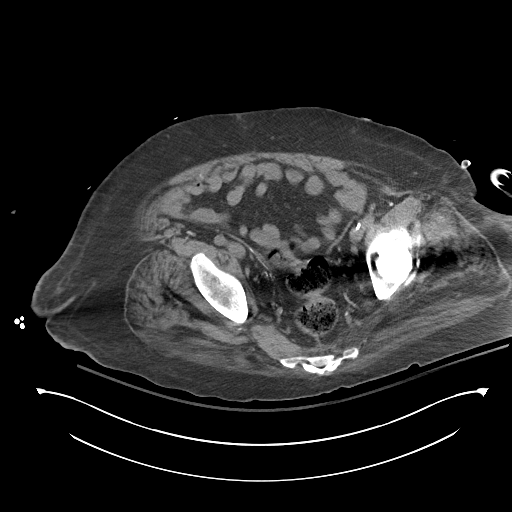
[im 38/99  soft-tissue]
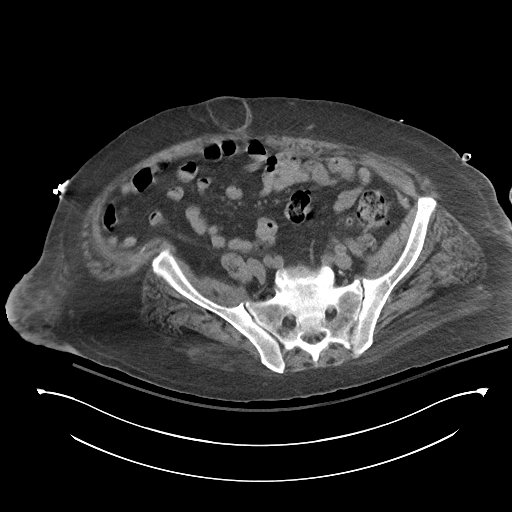
[im 47/99  soft-tissue]
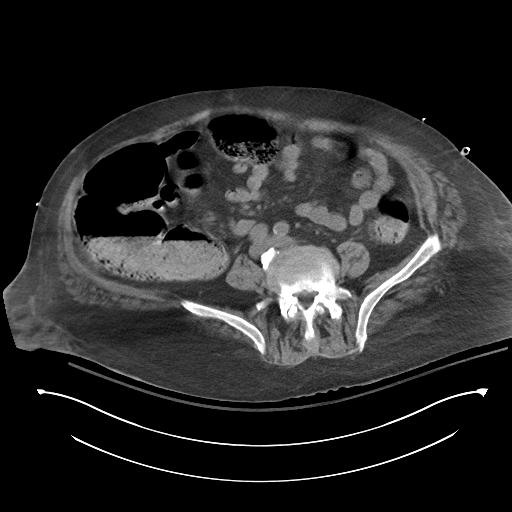
[im 52/99  soft-tissue]
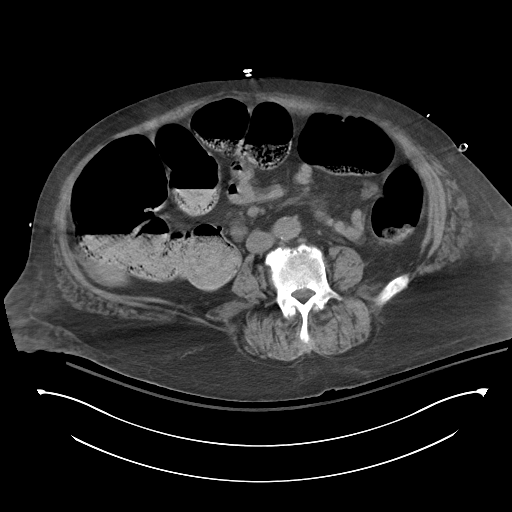
[im 61/99  soft-tissue]
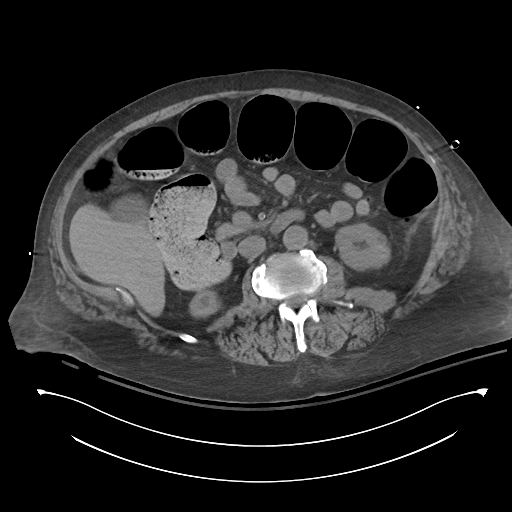
[im 71/99  soft-tissue]
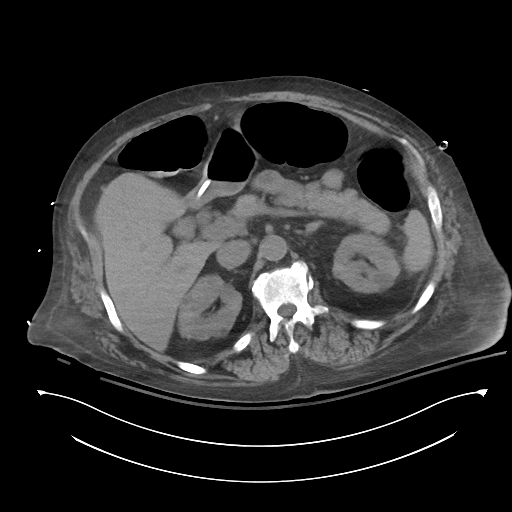
[im 71/99  bone]
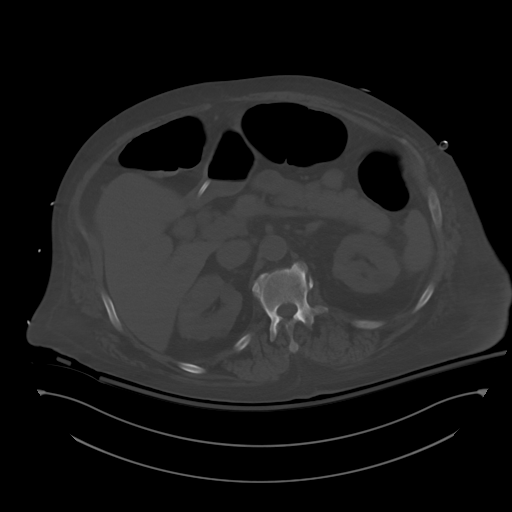
[im 75/99  soft-tissue]
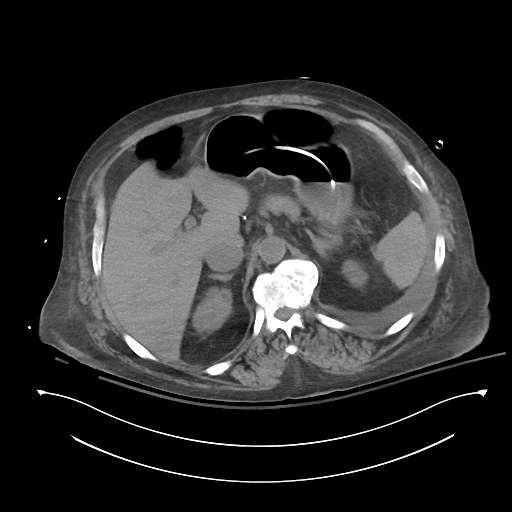
[im 85/99  soft-tissue]
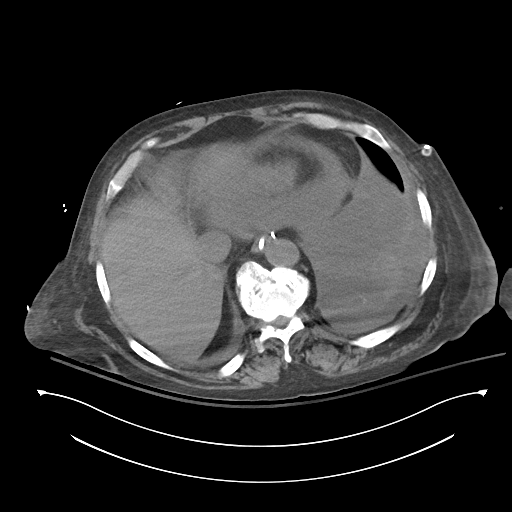
[im 94/99  soft-tissue]
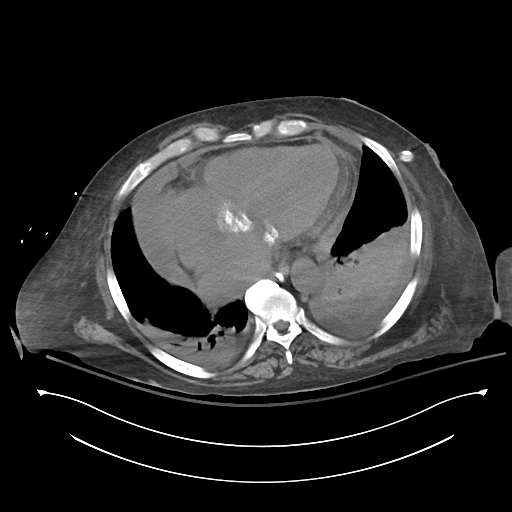

[Series 6: cor · coronal · 0.99mm/px · 3 of 110 slices shown]
[im 37/110  soft-tissue]
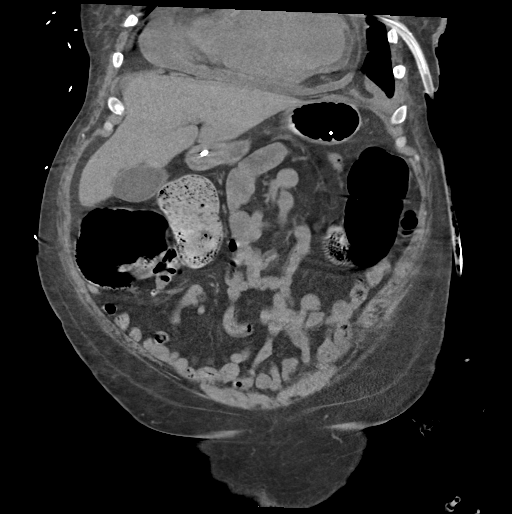
[im 49/110  soft-tissue]
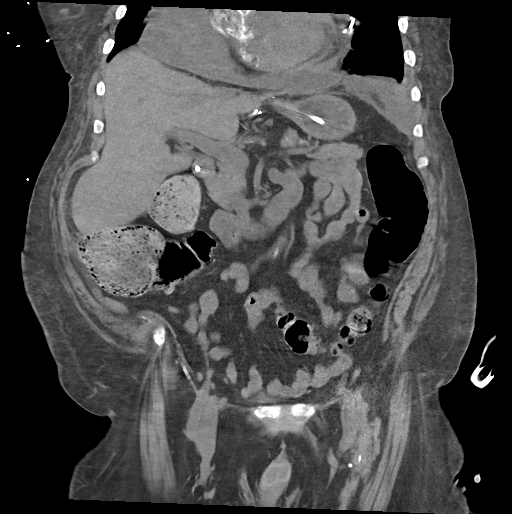
[im 61/110  soft-tissue]
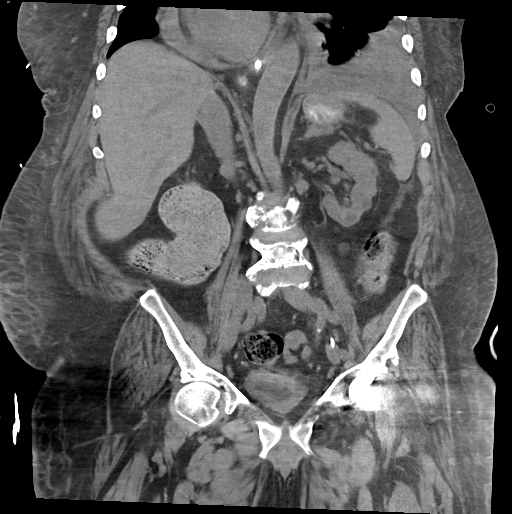

[15 of 46 positions shown; findings below may reference images not displayed]

FINDINGS: Lower chest: At least small volume partially visualized pericardial
effusion. Bilateral trace pleural effusions, left greater than
right. Bilateral lower lobe heterogeneous consolidation. Marked
mitral annular calcifications.

Hepatobiliary: No focal liver abnormality. No gallstones,
gallbladder wall thickening, or pericholecystic fluid. No biliary
dilatation.

Pancreas: No focal lesion. Normal pancreatic contour. No surrounding
inflammatory changes. No main pancreatic ductal dilatation.

Spleen: Normal in size without focal abnormality.

Adrenals/Urinary Tract:

No adrenal nodule bilaterally.

Multiple subcentimeter hypo and hyperdensities within the kidneys
are too small to characterize. There there is a 1.7 cm fluid density
lesion within left kidney likely represents a simple renal cyst. No
nephrolithiasis, no hydronephrosis, and no contour-deforming renal
mass. No ureterolithiasis or hydroureter.

The urinary bladder is decompressed.

Stomach/Bowel: Enteric tube with tip terminating in the second
portion of the duodenum. Stomach is within normal limits. No
evidence of bowel wall thickening or dilatation. Stool throughout
the ascending colon and transverse colon with no definite
pneumatosis ([DATE], 51). Scattered sigmoid diverticulosis. Appendix
appears normal.

Vascular/Lymphatic: Left femoral approach arterial and venous
catheters terminating in the external iliac vessels. No abdominal
aorta or iliac aneurysm. Mild atherosclerotic plaque of the aorta
and its branches. No abdominal, pelvic, or inguinal lymphadenopathy.

Reproductive: Not well visualized due to streak artifact originating
from the femoral surgical hardware.

Other: Upper abdomen surgical clips. Trace left upper abdomen simple
free fluid. No intraperitoneal free gas. No organized fluid
collection.

Musculoskeletal:

Fat containing supraumbilical and periumbilical hernias ([DATE]).

No suspicious lytic or blastic osseous lesions. No acute displaced
fracture. Multilevel degenerative changes of the spine. Partially
visualized left hip arthroplasty.
IMPRESSION: 1. Bilateral lower lobe heterogeneous consolidations likely
reflecting a combination of atelectasis and infection/inflammation.
2. At least small volume partially visualized pericardial effusion.
3. Bilateral trace pleural effusions, left greater than right.
4. Stool throughout the ascending colon and transverse colon with no
definite pneumatosis. Limited evaluation for ischemia on this
noncontrast study. Given appearance of these loops of large bowel,
recommend close clinical follow-up with serial abdominal exams as
pneumatosis cannot be fully excluded. Correlate with lactate levels.
5. Scattered sigmoid diverticulosis with no findings of acute
diverticulitis.
6. Multiple subcentimeter hyper and hypodense renal lesions that are
too small to characterize.
7. Aortic Atherosclerosis ([id number]) including marked mitral
annular calcifications.

## 2022-02-28 IMAGING — DX DG CHEST 1V PORT
2 series · 2 of 2 positions shown · non-contrast
Comparison: 02/01/2021

CLINICAL DATA: Status post cardiac arrest

EXAM:
PORTABLE CHEST 1 VIEW

[chest ap (1 of 2)]
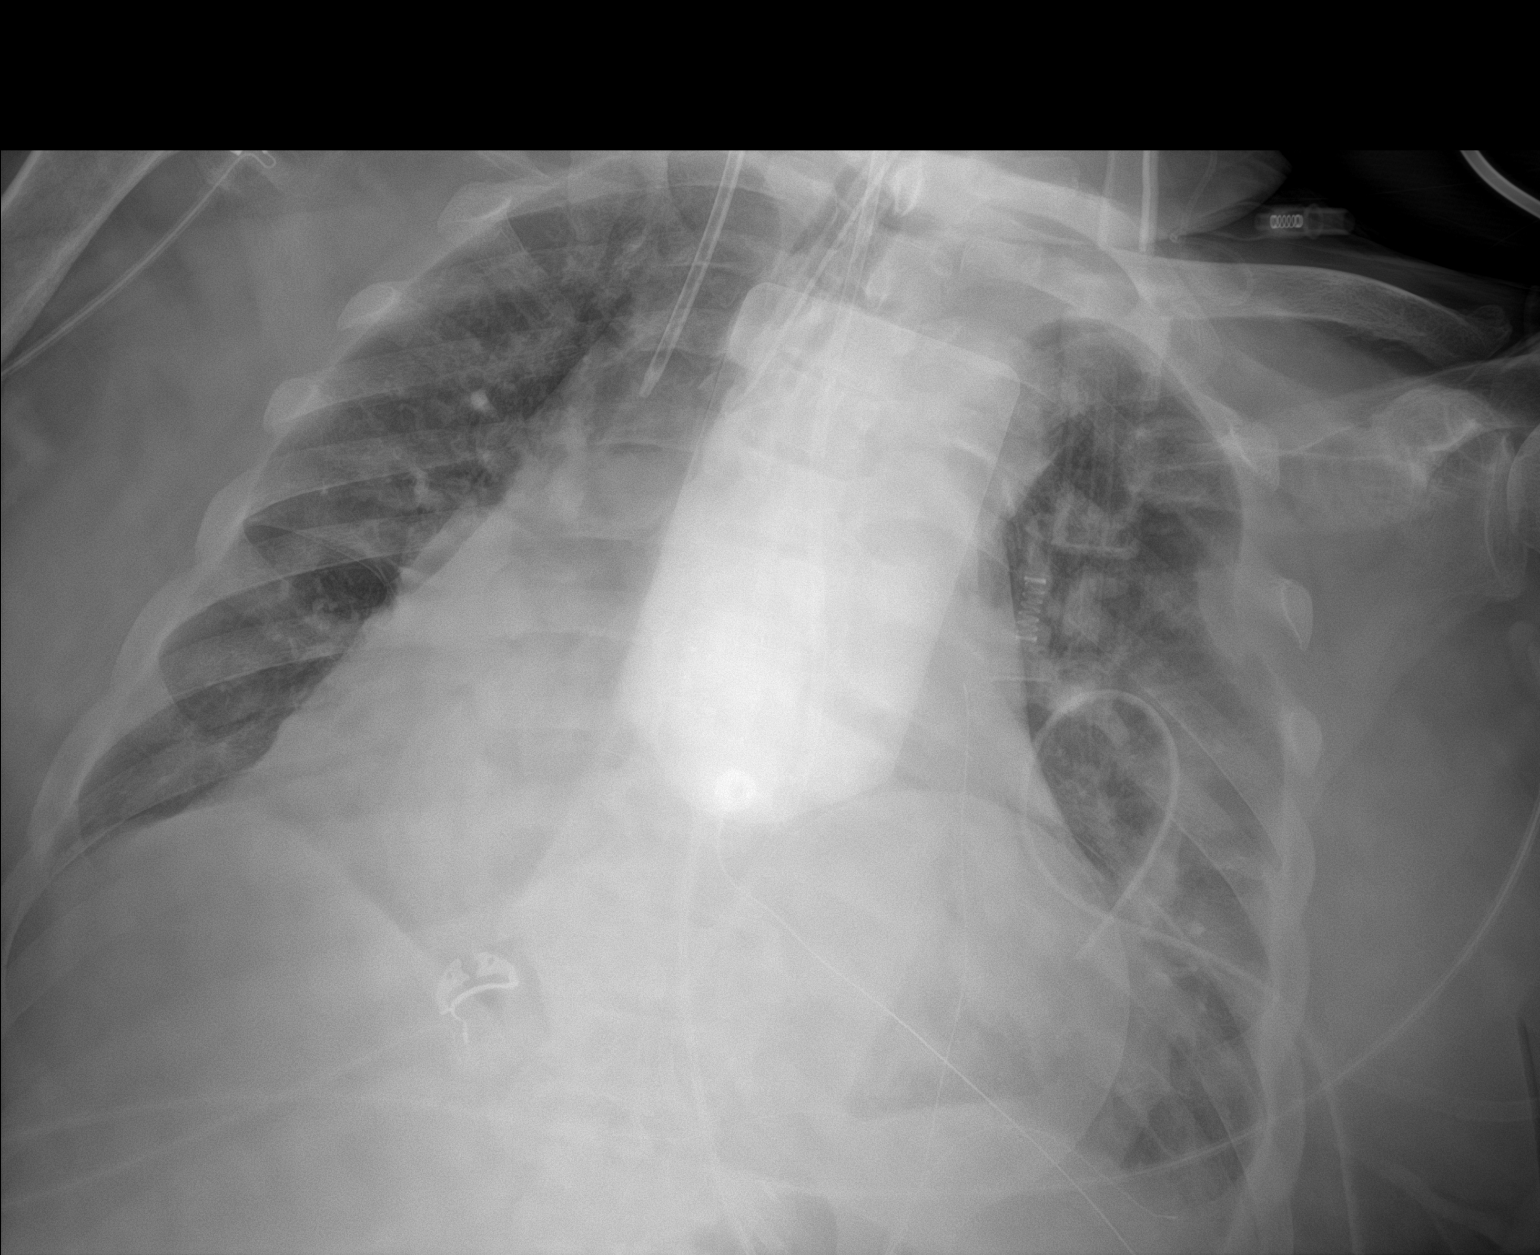

[chest ap (2 of 2)]
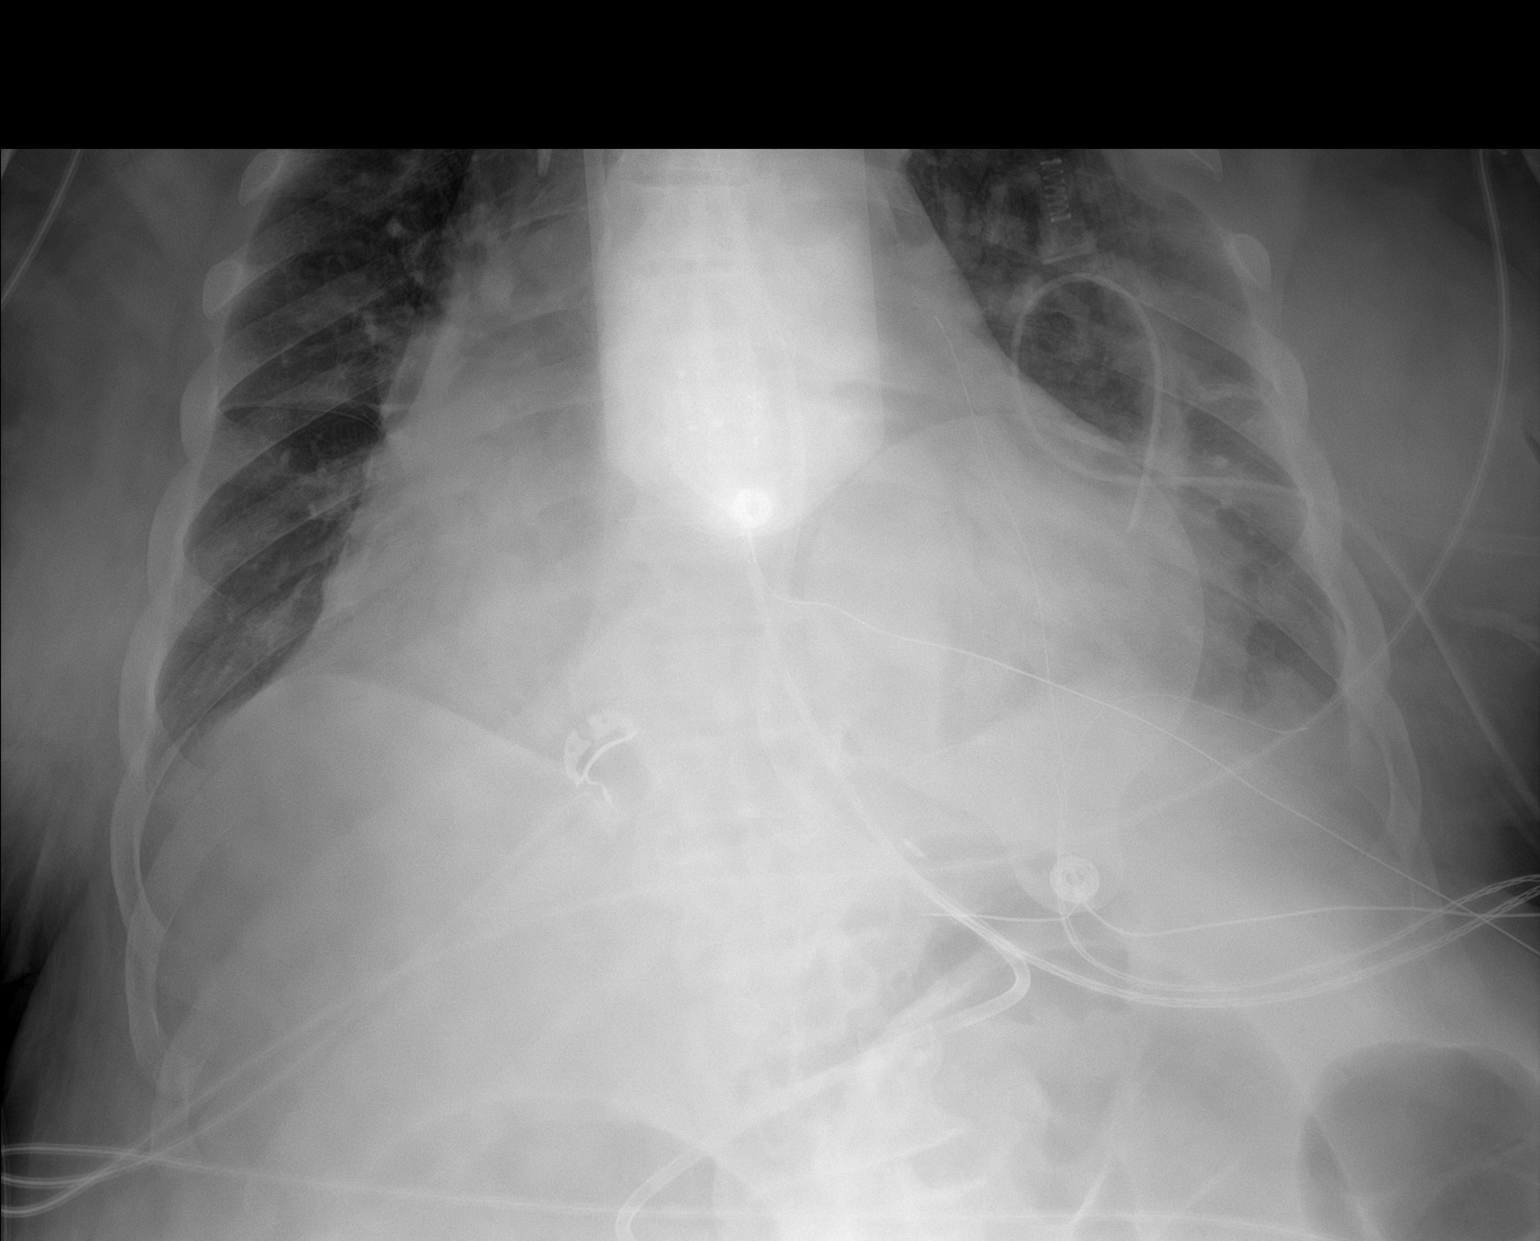

[2 of 2 positions shown; findings below may reference images not displayed]

FINDINGS: Cardiac shadow is enlarged but stable. Endotracheal tube, feeding
catheter and right jugular central line are again seen and stable.
Increased airspace opacity is noted likely representing patchy edema
related to the recent arrest. No bony abnormality is seen.
IMPRESSION: Increasing patchy airspace opacity likely representing edema.

Tubes and lines as described.

## 2022-03-02 IMAGING — DX DG CHEST 1V PORT
1 series · 1 of 1 positions shown · non-contrast
Comparison: 02/02/2021

CLINICAL DATA: Central line placement, transvenous pacer

EXAM:
PORTABLE CHEST 1 VIEW

[chest ap]
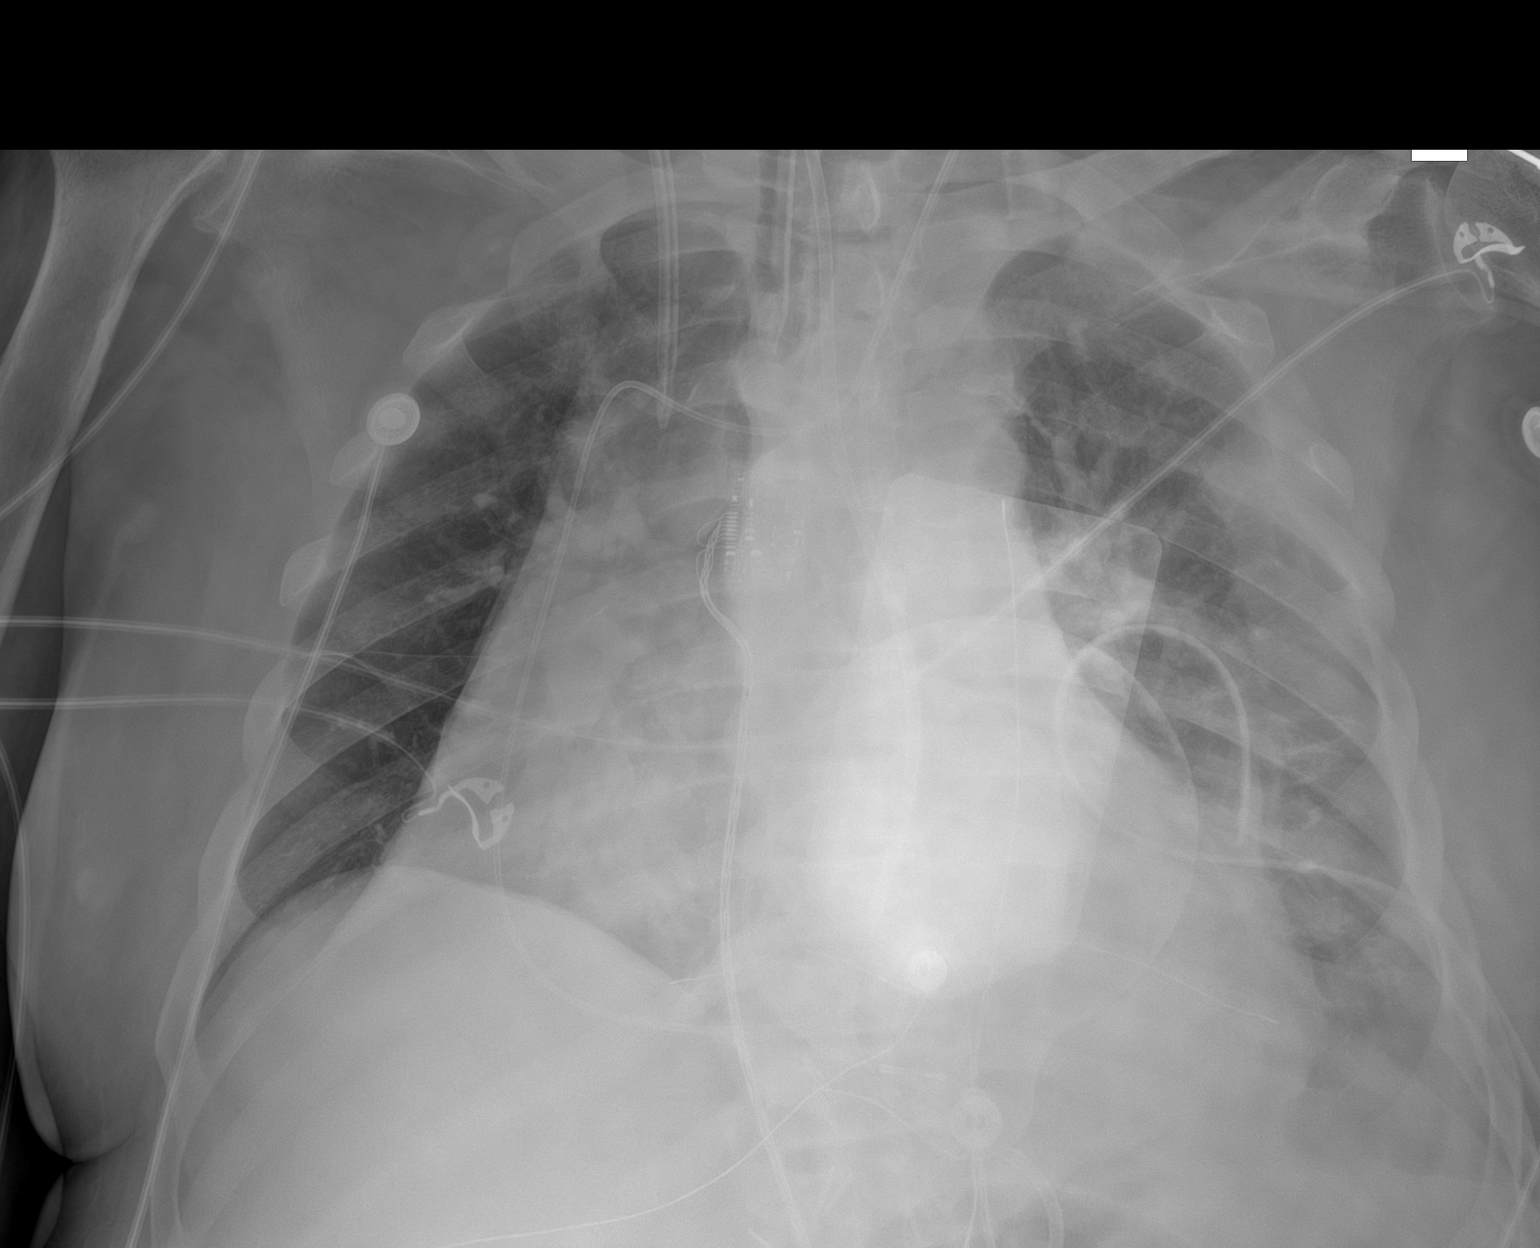

[1 of 1 positions shown; findings below may reference images not displayed]

FINDINGS: Single frontal view of the chest demonstrates endotracheal tube
overlying tracheal air column tip midway between thoracic inlet and
carina. Enteric catheter passes below diaphragm tip excluded by
collimation. Left chest tube again noted unchanged. External
defibrillator pads overlie the cardiac silhouette.

There is a right internal jugular catheter tip overlying superior
vena cava. Left internal jugular catheter is identified, distal
aspect coiled over the inferior margin of the cardiac silhouette,
tip in the region of the proximal aspect of the right ventricle.

The cardiac silhouette remains markedly enlarged. There is continued
central vascular congestion, with developing left basilar
consolidation and/or small effusion. No evidence of pneumothorax on
this supine evaluation. No acute bony abnormalities.
IMPRESSION: 1. Support devices as above.
2. Central vascular congestion, with developing left basilar
consolidation and effusion.
